# Patient Record
Sex: Male | Born: 1935
Health system: Southern US, Community
[De-identification: ages and names within clinical notes are randomized; demographics above are authoritative.]

## PROBLEM LIST (undated history)

## (undated) DIAGNOSIS — C61 Malignant neoplasm of prostate: Secondary | ICD-10-CM

## (undated) DIAGNOSIS — I350 Nonrheumatic aortic (valve) stenosis: Secondary | ICD-10-CM

## (undated) DIAGNOSIS — I2510 Atherosclerotic heart disease of native coronary artery without angina pectoris: Secondary | ICD-10-CM

## (undated) DIAGNOSIS — N312 Flaccid neuropathic bladder, not elsewhere classified: Secondary | ICD-10-CM

## (undated) DIAGNOSIS — E785 Hyperlipidemia, unspecified: Secondary | ICD-10-CM

## (undated) DIAGNOSIS — M1990 Unspecified osteoarthritis, unspecified site: Secondary | ICD-10-CM

## (undated) DIAGNOSIS — N139 Obstructive and reflux uropathy, unspecified: Secondary | ICD-10-CM

## (undated) DIAGNOSIS — G629 Polyneuropathy, unspecified: Secondary | ICD-10-CM

## (undated) DIAGNOSIS — R609 Edema, unspecified: Secondary | ICD-10-CM

## (undated) DIAGNOSIS — G250 Essential tremor: Secondary | ICD-10-CM

## (undated) DIAGNOSIS — I10 Essential (primary) hypertension: Secondary | ICD-10-CM

## (undated) DIAGNOSIS — K529 Noninfective gastroenteritis and colitis, unspecified: Secondary | ICD-10-CM

## (undated) DIAGNOSIS — I509 Heart failure, unspecified: Secondary | ICD-10-CM

## (undated) HISTORY — DX: Hyperlipidemia, unspecified: E78.5

## (undated) HISTORY — PX: COLONOSCOPY: SHX174

## (undated) HISTORY — DX: Flaccid neuropathic bladder, not elsewhere classified: N31.2

## (undated) HISTORY — DX: Polyneuropathy, unspecified: G62.9

## (undated) HISTORY — DX: Essential tremor: G25.0

## (undated) HISTORY — DX: Edema, unspecified: R60.9

## (undated) HISTORY — DX: Obstructive and reflux uropathy, unspecified: N13.9

## (undated) HISTORY — DX: Malignant neoplasm of prostate: C61

## (undated) HISTORY — PX: CARDIAC CATHETERIZATION: SHX172

## (undated) HISTORY — DX: Essential (primary) hypertension: I10

---

## 1972-06-28 HISTORY — PX: SKIN CANCER EXCISION: SHX779

## 1993-06-28 HISTORY — PX: ACHILLES TENDON SURGERY: SHX542

## 1998-12-23 ENCOUNTER — Ambulatory Visit (HOSPITAL_COMMUNITY): Admission: RE | Admit: 1998-12-23 | Discharge: 1998-12-23 | Payer: Self-pay | Admitting: Gastroenterology

## 1999-03-23 ENCOUNTER — Encounter: Admission: RE | Admit: 1999-03-23 | Discharge: 1999-04-10 | Payer: Self-pay | Admitting: Sports Medicine

## 2003-06-29 DIAGNOSIS — C61 Malignant neoplasm of prostate: Secondary | ICD-10-CM

## 2003-06-29 HISTORY — DX: Malignant neoplasm of prostate: C61

## 2003-10-04 ENCOUNTER — Ambulatory Visit: Admission: RE | Admit: 2003-10-04 | Discharge: 2003-12-02 | Payer: Self-pay | Admitting: Radiation Oncology

## 2007-06-23 ENCOUNTER — Ambulatory Visit (HOSPITAL_COMMUNITY): Admission: RE | Admit: 2007-06-23 | Discharge: 2007-06-23 | Payer: Self-pay | Admitting: Urology

## 2007-11-05 ENCOUNTER — Emergency Department (HOSPITAL_COMMUNITY): Admission: EM | Admit: 2007-11-05 | Discharge: 2007-11-05 | Payer: Self-pay | Admitting: Emergency Medicine

## 2008-06-14 ENCOUNTER — Ambulatory Visit: Payer: Self-pay

## 2008-08-14 ENCOUNTER — Encounter: Admission: RE | Admit: 2008-08-14 | Discharge: 2008-08-14 | Payer: Self-pay | Admitting: Neurology

## 2008-08-19 ENCOUNTER — Encounter: Admission: RE | Admit: 2008-08-19 | Discharge: 2008-08-19 | Payer: Self-pay | Admitting: Neurology

## 2008-09-13 ENCOUNTER — Encounter: Admission: RE | Admit: 2008-09-13 | Discharge: 2008-09-13 | Payer: Self-pay | Admitting: Neurology

## 2008-11-18 ENCOUNTER — Encounter: Admission: RE | Admit: 2008-11-18 | Discharge: 2008-11-18 | Payer: Self-pay | Admitting: Neurology

## 2008-11-24 ENCOUNTER — Emergency Department (HOSPITAL_COMMUNITY): Admission: EM | Admit: 2008-11-24 | Discharge: 2008-11-24 | Payer: Self-pay | Admitting: Emergency Medicine

## 2009-06-28 HISTORY — PX: TOTAL KNEE ARTHROPLASTY: SHX125

## 2009-11-10 ENCOUNTER — Inpatient Hospital Stay (HOSPITAL_COMMUNITY): Admission: RE | Admit: 2009-11-10 | Discharge: 2009-11-13 | Payer: Self-pay | Admitting: Orthopedic Surgery

## 2010-04-09 ENCOUNTER — Ambulatory Visit (HOSPITAL_COMMUNITY)
Admission: RE | Admit: 2010-04-09 | Discharge: 2010-04-09 | Payer: Self-pay | Source: Home / Self Care | Admitting: Gastroenterology

## 2010-05-06 ENCOUNTER — Encounter: Admission: RE | Admit: 2010-05-06 | Discharge: 2010-05-06 | Payer: Self-pay | Admitting: Family Medicine

## 2010-09-14 LAB — CBC
HCT: 29.7 % — ABNORMAL LOW (ref 39.0–52.0)
HCT: 29.8 % — ABNORMAL LOW (ref 39.0–52.0)
HCT: 32.7 % — ABNORMAL LOW (ref 39.0–52.0)
Hemoglobin: 10 g/dL — ABNORMAL LOW (ref 13.0–17.0)
Hemoglobin: 11.1 g/dL — ABNORMAL LOW (ref 13.0–17.0)
Hemoglobin: 9.9 g/dL — ABNORMAL LOW (ref 13.0–17.0)
MCHC: 33.4 g/dL (ref 30.0–36.0)
MCHC: 33.7 g/dL (ref 30.0–36.0)
MCHC: 34 g/dL (ref 30.0–36.0)
MCV: 97.7 fL (ref 78.0–100.0)
MCV: 98.2 fL (ref 78.0–100.0)
MCV: 98.3 fL (ref 78.0–100.0)
Platelets: 120 10*3/uL — ABNORMAL LOW (ref 150–400)
Platelets: 124 10*3/uL — ABNORMAL LOW (ref 150–400)
Platelets: 132 10*3/uL — ABNORMAL LOW (ref 150–400)
RBC: 3.03 MIL/uL — ABNORMAL LOW (ref 4.22–5.81)
RBC: 3.03 MIL/uL — ABNORMAL LOW (ref 4.22–5.81)
RBC: 3.34 MIL/uL — ABNORMAL LOW (ref 4.22–5.81)
RDW: 13.2 % (ref 11.5–15.5)
RDW: 13.3 % (ref 11.5–15.5)
RDW: 13.5 % (ref 11.5–15.5)
WBC: 8.4 10*3/uL (ref 4.0–10.5)
WBC: 9.4 10*3/uL (ref 4.0–10.5)
WBC: 9.8 10*3/uL (ref 4.0–10.5)

## 2010-09-14 LAB — BASIC METABOLIC PANEL
BUN: 15 mg/dL (ref 6–23)
BUN: 18 mg/dL (ref 6–23)
CO2: 27 mEq/L (ref 19–32)
CO2: 27 mEq/L (ref 19–32)
Calcium: 8.1 mg/dL — ABNORMAL LOW (ref 8.4–10.5)
Calcium: 8.4 mg/dL (ref 8.4–10.5)
Chloride: 106 mEq/L (ref 96–112)
Chloride: 107 mEq/L (ref 96–112)
Creatinine, Ser: 1.17 mg/dL (ref 0.4–1.5)
Creatinine, Ser: 1.2 mg/dL (ref 0.4–1.5)
GFR calc Af Amer: >60 mL/min (ref >60)
GFR calc Af Amer: >60 mL/min (ref >60)
GFR calc non Af Amer: 58 mL/min — ABNORMAL LOW (ref >60)
GFR calc non Af Amer: >60 mL/min (ref >60)
Glucose, Bld: 129 mg/dL — ABNORMAL HIGH (ref 70–99)
Glucose, Bld: 145 mg/dL — ABNORMAL HIGH (ref 70–99)
Potassium: 4.2 mEq/L (ref 3.5–5.1)
Potassium: 4.5 mEq/L (ref 3.5–5.1)
Sodium: 138 mEq/L (ref 135–145)
Sodium: 139 mEq/L (ref 135–145)

## 2010-09-14 LAB — PROTIME-INR
INR: 1.16 (ref 0.00–1.49)
INR: 1.49 (ref 0.00–1.49)
INR: 2.15 — ABNORMAL HIGH (ref 0.00–1.49)
Prothrombin Time: 14.7 seconds (ref 11.6–15.2)
Prothrombin Time: 17.9 seconds — ABNORMAL HIGH (ref 11.6–15.2)
Prothrombin Time: 23.8 seconds — ABNORMAL HIGH (ref 11.6–15.2)

## 2010-09-15 LAB — COMPREHENSIVE METABOLIC PANEL
ALT: 20 U/L (ref 0–53)
AST: 24 U/L (ref 0–37)
Albumin: 3.8 g/dL (ref 3.5–5.2)
Alkaline Phosphatase: 37 U/L — ABNORMAL LOW (ref 39–117)
BUN: 25 mg/dL — ABNORMAL HIGH (ref 6–23)
CO2: 24 mEq/L (ref 19–32)
Calcium: 9.1 mg/dL (ref 8.4–10.5)
Chloride: 107 mEq/L (ref 96–112)
Creatinine, Ser: 1.4 mg/dL (ref 0.4–1.5)
GFR calc Af Amer: 59 mL/min — ABNORMAL LOW (ref >60)
GFR calc non Af Amer: 49 mL/min — ABNORMAL LOW (ref >60)
Glucose, Bld: 107 mg/dL — ABNORMAL HIGH (ref 70–99)
Potassium: 4.1 mEq/L (ref 3.5–5.1)
Sodium: 141 mEq/L (ref 135–145)
Total Bilirubin: 0.9 mg/dL (ref 0.3–1.2)
Total Protein: 6.6 g/dL (ref 6.0–8.3)

## 2010-09-15 LAB — URINALYSIS, ROUTINE W REFLEX MICROSCOPIC
Bilirubin Urine: NEGATIVE
Glucose, UA: NEGATIVE mg/dL
Hgb urine dipstick: NEGATIVE
Ketones, ur: NEGATIVE mg/dL
Nitrite: NEGATIVE
Protein, ur: NEGATIVE mg/dL
Specific Gravity, Urine: 1.014 (ref 1.005–1.030)
Urobilinogen, UA: 0.2 mg/dL (ref 0.0–1.0)
pH: 6.5 (ref 5.0–8.0)

## 2010-09-15 LAB — CBC
HCT: 39 % (ref 39.0–52.0)
Hemoglobin: 13 g/dL (ref 13.0–17.0)
MCHC: 33.3 g/dL (ref 30.0–36.0)
MCV: 98 fL (ref 78.0–100.0)
Platelets: 147 10*3/uL — ABNORMAL LOW (ref 150–400)
RBC: 3.98 MIL/uL — ABNORMAL LOW (ref 4.22–5.81)
RDW: 13.8 % (ref 11.5–15.5)
WBC: 5.8 10*3/uL (ref 4.0–10.5)

## 2010-09-15 LAB — TYPE AND SCREEN
ABO/RH(D): A POS
Antibody Screen: NEGATIVE

## 2010-09-15 LAB — ABO/RH: ABO/RH(D): A POS

## 2010-09-15 LAB — APTT: aPTT: 32 seconds (ref 24–37)

## 2010-09-15 LAB — PROTIME-INR
INR: 1.11 (ref 0.00–1.49)
Prothrombin Time: 14.2 seconds (ref 11.6–15.2)

## 2010-10-06 LAB — DIFFERENTIAL
Basophils Absolute: 0 10*3/uL (ref 0.0–0.1)
Basophils Relative: 0 % (ref 0–1)
Eosinophils Absolute: 0.1 10*3/uL (ref 0.0–0.7)
Eosinophils Relative: 1 % (ref 0–5)
Lymphocytes Relative: 14 % (ref 12–46)
Lymphs Abs: 0.8 10*3/uL (ref 0.7–4.0)
Monocytes Absolute: 0.8 10*3/uL (ref 0.1–1.0)
Monocytes Relative: 13 % — ABNORMAL HIGH (ref 3–12)
Neutro Abs: 4.5 10*3/uL (ref 1.7–7.7)
Neutrophils Relative %: 73 % (ref 43–77)

## 2010-10-06 LAB — BASIC METABOLIC PANEL
BUN: 23 mg/dL (ref 6–23)
CO2: 24 mEq/L (ref 19–32)
Calcium: 9 mg/dL (ref 8.4–10.5)
Chloride: 107 mEq/L (ref 96–112)
Creatinine, Ser: 1.51 mg/dL — ABNORMAL HIGH (ref 0.4–1.5)
GFR calc Af Amer: 54 mL/min — ABNORMAL LOW (ref >60)
GFR calc non Af Amer: 45 mL/min — ABNORMAL LOW (ref >60)
Glucose, Bld: 101 mg/dL — ABNORMAL HIGH (ref 70–99)
Potassium: 4.6 mEq/L (ref 3.5–5.1)
Sodium: 137 mEq/L (ref 135–145)

## 2010-10-06 LAB — URINALYSIS, ROUTINE W REFLEX MICROSCOPIC
Bilirubin Urine: NEGATIVE
Glucose, UA: NEGATIVE mg/dL
Ketones, ur: NEGATIVE mg/dL
Nitrite: NEGATIVE
Protein, ur: >300 mg/dL — AB
Specific Gravity, Urine: 1.026 (ref 1.005–1.030)
Urobilinogen, UA: 0.2 mg/dL (ref 0.0–1.0)
pH: 6.5 (ref 5.0–8.0)

## 2010-10-06 LAB — URINE CULTURE
Colony Count: NO GROWTH
Culture: NO GROWTH

## 2010-10-06 LAB — PROTIME-INR
INR: 1 (ref 0.00–1.49)
Prothrombin Time: 13.4 seconds (ref 11.6–15.2)

## 2010-10-06 LAB — URINE MICROSCOPIC-ADD ON

## 2010-10-06 LAB — CBC
HCT: 37.2 % — ABNORMAL LOW (ref 39.0–52.0)
Hemoglobin: 12.6 g/dL — ABNORMAL LOW (ref 13.0–17.0)
MCHC: 33.9 g/dL (ref 30.0–36.0)
MCV: 97.3 fL (ref 78.0–100.0)
Platelets: 168 10*3/uL (ref 150–400)
RBC: 3.82 MIL/uL — ABNORMAL LOW (ref 4.22–5.81)
RDW: 13.5 % (ref 11.5–15.5)
WBC: 6.2 10*3/uL (ref 4.0–10.5)

## 2010-10-06 LAB — APTT: aPTT: 34 seconds (ref 24–37)

## 2010-11-10 NOTE — Consult Note (Signed)
[name] [name], BRINCKS NO.:  [id number]   MEDICAL RECORD NO.:  [id number]          PATIENT TYPE:  EMS   LOCATION:  ED                           FACILITY:  [facility]   PHYSICIAN:  Hanley Ben, M.D.  DATE OF BIRTH:  [birth year]   DATE OF CONSULTATION:  11/24/2008  DATE OF DISCHARGE:  11/24/2008                                 CONSULTATION   REASON FOR CONSULTATION:  Gross hematuria.   HISTORY OF PRESENT ILLNESS:  The patient is 77 year old male with a  history of prostate cancer who has been on active surveillance.  He was  doing well until last night when he started having gross painless  hematuria.  For the past 2 days before these  episodes of hematuria, he  started having slight dysuria.  He denies frequency, urgency, hesitancy  or straining on urination.  His PSA has been fluctuating and it was 23.1  in May 2009 and was 12.4 in August 2009.  The hematuria is not  associated with any flank pain or any pain in the suprapubic area.  He  voids with a good flow.  His hemoglobin is 12.6, hematocrit 37.2 and WBC  6.2.  BUN is 23, creatinine 1.51.  His INR is 1.0.  Urinalysis shows too-  numerous-to-count rbc's and too-numerous-to-count wbc's.   PAST MEDICAL HISTORY:  1. Sleep apnea.  2. Hypercholesterolemia.  3. Hypertension.  4. Prostate cancer.   PAST SURGICAL HISTORY:  Repair of ruptured Achilles tendon.   MEDICATIONS:  1. Aspirin 81 mg.  2. Benecol 40 mg.  3. Vytorin 10/40 mg one daily.   FAMILY HISTORY:  Father and mother are deceased of unknown causes to  him.   ALLERGIES:  NO KNOWN DRUG ALLERGIES.   REVIEW OF SYSTEMS:  Review of systems is as noted in the HPI and  everything else is negative.   PHYSICAL EXAMINATION:  GENERAL:  This is a well-developed, 77 year old  male who is very anxious.  He is alert and oriented to time, place and  person.  VITAL SIGNS:  Stable.  ABDOMEN:  His abdomen is soft and nondistended, nontender.  He has no  CVA  tenderness.  No hepatomegaly, no splenomegaly.  Kidneys are not  palpable.  Bladder is not distended.  He has no inguinal hernia.  No  inguinal adenopathy.  Penis and scrotal contents are within normal  limits.  RECTAL:  Examination is deferred.   IMPRESSION:  1. Gross hematuria.  2. Prostate cancer.  3. Hypertension.  4. Urinary tract infection.   PLAN:  Urine culture and sensitivity.  Start Levaquin 500 mg daily,  Avodart  0.5 mg daily.  Increase fluid intake.  He will have a CT scan  and cystoscopy as an outpatient.      Hanley Ben, M.D.  Electronically Signed     MN/MEDQ  D:  11/24/2008  T:  11/25/2008  Job:  [id number]

## 2011-08-04 DIAGNOSIS — H905 Unspecified sensorineural hearing loss: Secondary | ICD-10-CM | POA: Diagnosis not present

## 2011-09-22 DIAGNOSIS — D485 Neoplasm of uncertain behavior of skin: Secondary | ICD-10-CM | POA: Diagnosis not present

## 2011-10-12 DIAGNOSIS — C61 Malignant neoplasm of prostate: Secondary | ICD-10-CM | POA: Diagnosis not present

## 2011-10-18 DIAGNOSIS — C61 Malignant neoplasm of prostate: Secondary | ICD-10-CM | POA: Diagnosis not present

## 2011-10-18 DIAGNOSIS — R339 Retention of urine, unspecified: Secondary | ICD-10-CM | POA: Diagnosis not present

## 2011-10-18 DIAGNOSIS — N138 Other obstructive and reflux uropathy: Secondary | ICD-10-CM | POA: Diagnosis not present

## 2011-10-18 DIAGNOSIS — N401 Enlarged prostate with lower urinary tract symptoms: Secondary | ICD-10-CM | POA: Diagnosis not present

## 2011-10-20 ENCOUNTER — Other Ambulatory Visit (HOSPITAL_COMMUNITY): Payer: Self-pay | Admitting: Urology

## 2011-10-20 DIAGNOSIS — Z8546 Personal history of malignant neoplasm of prostate: Secondary | ICD-10-CM

## 2011-11-10 ENCOUNTER — Encounter (HOSPITAL_COMMUNITY)
Admission: RE | Admit: 2011-11-10 | Discharge: 2011-11-10 | Disposition: A | Discharge status: Home / Self Care | Payer: Medicare Other | Source: Ambulatory Visit | Attending: Urology | Admitting: Urology

## 2011-11-10 DIAGNOSIS — Z8546 Personal history of malignant neoplasm of prostate: Secondary | ICD-10-CM

## 2011-11-10 DIAGNOSIS — C61 Malignant neoplasm of prostate: Secondary | ICD-10-CM | POA: Insufficient documentation

## 2011-11-10 MED ORDER — TECHNETIUM TC 99M MEDRONATE IV KIT
25.0000 | PACK | Freq: Once | INTRAVENOUS | Status: AC | PRN
  Administered 2011-11-10: 25 via INTRAVENOUS

## 2011-11-17 DIAGNOSIS — H02839 Dermatochalasis of unspecified eye, unspecified eyelid: Secondary | ICD-10-CM | POA: Diagnosis not present

## 2011-11-17 DIAGNOSIS — H43819 Vitreous degeneration, unspecified eye: Secondary | ICD-10-CM | POA: Diagnosis not present

## 2011-11-17 DIAGNOSIS — H35369 Drusen (degenerative) of macula, unspecified eye: Secondary | ICD-10-CM | POA: Diagnosis not present

## 2011-11-17 DIAGNOSIS — H2510 Age-related nuclear cataract, unspecified eye: Secondary | ICD-10-CM | POA: Diagnosis not present

## 2011-11-24 DIAGNOSIS — H2510 Age-related nuclear cataract, unspecified eye: Secondary | ICD-10-CM | POA: Diagnosis not present

## 2011-12-01 DIAGNOSIS — IMO0002 Reserved for concepts with insufficient information to code with codable children: Secondary | ICD-10-CM | POA: Diagnosis not present

## 2011-12-01 DIAGNOSIS — H2510 Age-related nuclear cataract, unspecified eye: Secondary | ICD-10-CM | POA: Diagnosis not present

## 2011-12-15 DIAGNOSIS — H35369 Drusen (degenerative) of macula, unspecified eye: Secondary | ICD-10-CM | POA: Diagnosis not present

## 2011-12-15 DIAGNOSIS — H2510 Age-related nuclear cataract, unspecified eye: Secondary | ICD-10-CM | POA: Diagnosis not present

## 2011-12-15 DIAGNOSIS — H43819 Vitreous degeneration, unspecified eye: Secondary | ICD-10-CM | POA: Diagnosis not present

## 2011-12-17 DIAGNOSIS — H2510 Age-related nuclear cataract, unspecified eye: Secondary | ICD-10-CM | POA: Diagnosis not present

## 2011-12-17 DIAGNOSIS — IMO0002 Reserved for concepts with insufficient information to code with codable children: Secondary | ICD-10-CM | POA: Diagnosis not present

## 2012-01-26 DIAGNOSIS — H04129 Dry eye syndrome of unspecified lacrimal gland: Secondary | ICD-10-CM | POA: Diagnosis not present

## 2012-01-26 DIAGNOSIS — H26499 Other secondary cataract, unspecified eye: Secondary | ICD-10-CM | POA: Diagnosis not present

## 2012-03-23 DIAGNOSIS — Z23 Encounter for immunization: Secondary | ICD-10-CM | POA: Diagnosis not present

## 2012-04-18 DIAGNOSIS — C61 Malignant neoplasm of prostate: Secondary | ICD-10-CM | POA: Diagnosis not present

## 2012-04-24 DIAGNOSIS — N138 Other obstructive and reflux uropathy: Secondary | ICD-10-CM | POA: Diagnosis not present

## 2012-04-24 DIAGNOSIS — R339 Retention of urine, unspecified: Secondary | ICD-10-CM | POA: Diagnosis not present

## 2012-04-24 DIAGNOSIS — N319 Neuromuscular dysfunction of bladder, unspecified: Secondary | ICD-10-CM | POA: Diagnosis not present

## 2012-04-24 DIAGNOSIS — C61 Malignant neoplasm of prostate: Secondary | ICD-10-CM | POA: Diagnosis not present

## 2012-04-24 DIAGNOSIS — N401 Enlarged prostate with lower urinary tract symptoms: Secondary | ICD-10-CM | POA: Diagnosis not present

## 2012-05-03 DIAGNOSIS — N281 Cyst of kidney, acquired: Secondary | ICD-10-CM | POA: Diagnosis not present

## 2012-05-17 DIAGNOSIS — M2040 Other hammer toe(s) (acquired), unspecified foot: Secondary | ICD-10-CM | POA: Diagnosis not present

## 2012-05-17 DIAGNOSIS — M779 Enthesopathy, unspecified: Secondary | ICD-10-CM | POA: Diagnosis not present

## 2012-05-17 DIAGNOSIS — M7150 Other bursitis, not elsewhere classified, unspecified site: Secondary | ICD-10-CM | POA: Diagnosis not present

## 2012-05-17 DIAGNOSIS — D2370 Other benign neoplasm of skin of unspecified lower limb, including hip: Secondary | ICD-10-CM | POA: Diagnosis not present

## 2012-05-17 DIAGNOSIS — M79609 Pain in unspecified limb: Secondary | ICD-10-CM | POA: Diagnosis not present

## 2012-06-07 DIAGNOSIS — E7800 Pure hypercholesterolemia, unspecified: Secondary | ICD-10-CM | POA: Diagnosis not present

## 2012-06-07 DIAGNOSIS — Z0000 Encounter for general adult medical examination without abnormal findings: Secondary | ICD-10-CM | POA: Diagnosis not present

## 2012-06-07 DIAGNOSIS — Z1331 Encounter for screening for depression: Secondary | ICD-10-CM | POA: Diagnosis not present

## 2012-06-07 DIAGNOSIS — M48061 Spinal stenosis, lumbar region without neurogenic claudication: Secondary | ICD-10-CM | POA: Diagnosis not present

## 2012-06-07 DIAGNOSIS — G609 Hereditary and idiopathic neuropathy, unspecified: Secondary | ICD-10-CM | POA: Diagnosis not present

## 2012-06-07 DIAGNOSIS — K5190 Ulcerative colitis, unspecified, without complications: Secondary | ICD-10-CM | POA: Diagnosis not present

## 2012-06-07 DIAGNOSIS — I10 Essential (primary) hypertension: Secondary | ICD-10-CM | POA: Diagnosis not present

## 2012-06-07 DIAGNOSIS — C61 Malignant neoplasm of prostate: Secondary | ICD-10-CM | POA: Diagnosis not present

## 2012-06-07 DIAGNOSIS — Z23 Encounter for immunization: Secondary | ICD-10-CM | POA: Diagnosis not present

## 2012-06-08 DIAGNOSIS — Z23 Encounter for immunization: Secondary | ICD-10-CM | POA: Diagnosis not present

## 2012-06-08 DIAGNOSIS — I10 Essential (primary) hypertension: Secondary | ICD-10-CM | POA: Diagnosis not present

## 2012-06-14 DIAGNOSIS — L570 Actinic keratosis: Secondary | ICD-10-CM | POA: Diagnosis not present

## 2012-06-14 DIAGNOSIS — B079 Viral wart, unspecified: Secondary | ICD-10-CM | POA: Diagnosis not present

## 2012-06-14 DIAGNOSIS — D485 Neoplasm of uncertain behavior of skin: Secondary | ICD-10-CM | POA: Diagnosis not present

## 2012-08-16 DIAGNOSIS — J40 Bronchitis, not specified as acute or chronic: Secondary | ICD-10-CM | POA: Diagnosis not present

## 2012-08-21 DIAGNOSIS — R059 Cough, unspecified: Secondary | ICD-10-CM | POA: Diagnosis not present

## 2012-08-23 DIAGNOSIS — B079 Viral wart, unspecified: Secondary | ICD-10-CM | POA: Diagnosis not present

## 2012-08-23 DIAGNOSIS — L570 Actinic keratosis: Secondary | ICD-10-CM | POA: Diagnosis not present

## 2012-09-13 DIAGNOSIS — I959 Hypotension, unspecified: Secondary | ICD-10-CM | POA: Diagnosis not present

## 2012-09-13 DIAGNOSIS — J189 Pneumonia, unspecified organism: Secondary | ICD-10-CM | POA: Diagnosis not present

## 2012-09-22 DIAGNOSIS — S7000XA Contusion of unspecified hip, initial encounter: Secondary | ICD-10-CM | POA: Diagnosis not present

## 2012-09-22 DIAGNOSIS — Z96659 Presence of unspecified artificial knee joint: Secondary | ICD-10-CM | POA: Diagnosis not present

## 2012-09-27 ENCOUNTER — Encounter: Payer: Self-pay | Admitting: Cardiology

## 2012-09-27 ENCOUNTER — Ambulatory Visit (INDEPENDENT_AMBULATORY_CARE_PROVIDER_SITE_OTHER): Payer: Medicare Other | Admitting: Cardiology

## 2012-09-27 VITALS — BP 124/74 | HR 78 | Ht 69.5 in | Wt 187.0 lb

## 2012-09-27 DIAGNOSIS — R609 Edema, unspecified: Secondary | ICD-10-CM

## 2012-09-27 DIAGNOSIS — R0600 Dyspnea, unspecified: Secondary | ICD-10-CM

## 2012-09-27 DIAGNOSIS — R600 Localized edema: Secondary | ICD-10-CM

## 2012-09-27 DIAGNOSIS — R0989 Other specified symptoms and signs involving the circulatory and respiratory systems: Secondary | ICD-10-CM

## 2012-09-27 DIAGNOSIS — I10 Essential (primary) hypertension: Secondary | ICD-10-CM | POA: Diagnosis not present

## 2012-09-27 DIAGNOSIS — E785 Hyperlipidemia, unspecified: Secondary | ICD-10-CM | POA: Insufficient documentation

## 2012-09-27 DIAGNOSIS — R0609 Other forms of dyspnea: Secondary | ICD-10-CM | POA: Diagnosis not present

## 2012-09-27 LAB — BRAIN NATRIURETIC PEPTIDE: Pro B Natriuretic peptide (BNP): 213 pg/mL — ABNORMAL HIGH (ref 0.0–100.0)

## 2012-09-27 LAB — TSH: TSH: 0.29 u[IU]/mL — ABNORMAL LOW (ref 0.35–5.50)

## 2012-09-27 LAB — D-DIMER, QUANTITATIVE (NOT AT ARMC): D-Dimer, Quant: 0.8 ug/mL-FEU — ABNORMAL HIGH (ref 0.00–0.48)

## 2012-09-27 NOTE — Patient Instructions (Addendum)
We will schedule you for an Echocardiogram and venous doppler study.  We will check lab work today.  Restrict your salt intake.  Elevate your feet when possible.

## 2012-09-27 NOTE — Progress Notes (Signed)
[name] [name] Date of Birth: 17-Dec-1927 Medical Record [id number]  History of Present Illness: [name] [name] is seen at the request of Dr. Rex Kras for evaluation of dyspnea. He is a pleasant 79 year old white male who reports that over the past 2 months he has had symptoms of dyspnea particularly on exertion. He also complains of fatigue and dizziness. He had taken an extensive trip to [place] earlier in the year. He developed an upper respiratory infection while there and was treated with antibiotics on his return. His blood pressure was running low and his Benicar was recently stopped. Since then his symptoms have been a little bit better. He has had increased swelling in his lower extremities, particularly in the left leg over the past 2 months. He denies any chest pain. He currently has no cough or fevers. He does have a history of hypertension, hyperlipidemia, and family history of coronary disease.  No current outpatient prescriptions on file prior to visit.   No current facility-administered medications on file prior to visit.    Not on File  Past Medical History  Diagnosis Date  . Peripheral neuropathy     Elevated by Dr. love 2010  . Essential hypertension   . Hyperlipidemia   . Essential tremor   . Obstructive uropathy   . Hypotonic bladder     L4-L5  . Prostate cancer 2005    Past Surgical History  Procedure Laterality Date  . Skin cancer excision  1974    On chest wall  . Achilles tendon surgery  1995  . Colonoscopy      With polyp resection  . Total knee arthroplasty  2011    History  Smoking status  . Never Smoker   Smokeless tobacco  . Not on file    History  Alcohol Use: Not on file    Family History  Problem Relation Age of Onset  . Hypertension Mother   . Heart failure Mother   . Diabetes Mother     Review of Systems: As noted in history of present illness.  All other systems were reviewed and are negative.  Physical Exam: BP 124/74  Pulse  78  Ht 5' 9.5" (1.765 m)  Wt 187 lb (84.823 kg)  BMI 27.23 kg/m2 He is a pleasant white male who appears younger than his stated age. HEENT: Normocephalic, atraumatic. Pupils equal round and reactive. Extraocular movements full. Sclera are clear. Oropharynx is clear. He has no JVD, adenopathy, thyromegaly, or bruits. Lungs: Clear Cardiovascular: Regular rate and rhythm, normal S1 and S2, grade 1/6 systolic murmur at the left upper sternal border. No diastolic murmur or S3. Abdomen: Overweight, soft, nontender. No masses or hepatosplenomegaly. Extremities: Femoral and pedal pulses are 2+. He has 2+ edema on the left and 1-2+ edema on the right. Skin: Warm and dry Neuro: Alert and oriented x3. Cranial nerves II through XII are intact.  LABORATORY DATA: ECG demonstrates normal sinus rhythm with a normal ECG.  Chest x-ray dated 08/21/2012 showed bibasilar atelectasis versus infiltrates. Repeat 09/13/2012 showed no active disease.  Laboratory data 09/13/2012 showed a BUN of 28, creatinine 1.29. Glucose 133. Other chemistries and CBC were normal.  Assessment / Plan: 1. Symptoms of dyspnea. Patient does have significant lower extremity edema. It is worse on the left. Given his recent travel degree need to make sure he does not have a DVT or possible PE. We will also assess his thyroid function. Today we will check a d-dimer, BNP level, and TSH. I'll schedule  him for venous Dopplers. We will also check an echocardiogram. I'll followup again after the studies. I recommended sodium restriction. Recommend elevation of his feet when possible. Given his recent hypotension I would not start him on diuretic until we have a more complete evaluation. If the above studies are unremarkable may need to consider ischemic evaluation.  2. Fatigue and lightheadedness. Improved with cessation of antihypertensive therapy.  3. Hypercholesterolemia. Patient is intolerant to statins. He is currently on Zetia.

## 2012-09-29 ENCOUNTER — Other Ambulatory Visit: Payer: Self-pay

## 2012-09-29 DIAGNOSIS — R7989 Other specified abnormal findings of blood chemistry: Secondary | ICD-10-CM

## 2012-10-02 ENCOUNTER — Ambulatory Visit (INDEPENDENT_AMBULATORY_CARE_PROVIDER_SITE_OTHER): Payer: Medicare Other | Admitting: *Deleted

## 2012-10-02 ENCOUNTER — Ambulatory Visit (HOSPITAL_COMMUNITY): Payer: Medicare Other | Attending: Cardiology | Admitting: Radiology

## 2012-10-02 DIAGNOSIS — R600 Localized edema: Secondary | ICD-10-CM

## 2012-10-02 DIAGNOSIS — R0602 Shortness of breath: Secondary | ICD-10-CM | POA: Insufficient documentation

## 2012-10-02 DIAGNOSIS — E785 Hyperlipidemia, unspecified: Secondary | ICD-10-CM

## 2012-10-02 DIAGNOSIS — R946 Abnormal results of thyroid function studies: Secondary | ICD-10-CM

## 2012-10-02 DIAGNOSIS — R609 Edema, unspecified: Secondary | ICD-10-CM | POA: Diagnosis not present

## 2012-10-02 DIAGNOSIS — I10 Essential (primary) hypertension: Secondary | ICD-10-CM | POA: Diagnosis not present

## 2012-10-02 DIAGNOSIS — R0609 Other forms of dyspnea: Secondary | ICD-10-CM

## 2012-10-02 DIAGNOSIS — R0989 Other specified symptoms and signs involving the circulatory and respiratory systems: Secondary | ICD-10-CM

## 2012-10-02 DIAGNOSIS — R0600 Dyspnea, unspecified: Secondary | ICD-10-CM

## 2012-10-02 DIAGNOSIS — R7989 Other specified abnormal findings of blood chemistry: Secondary | ICD-10-CM

## 2012-10-02 DIAGNOSIS — R011 Cardiac murmur, unspecified: Secondary | ICD-10-CM | POA: Insufficient documentation

## 2012-10-02 NOTE — Progress Notes (Signed)
Echocardiogram performed.  

## 2012-10-03 LAB — TSH: TSH: 0.94 u[IU]/mL (ref 0.35–5.50)

## 2012-10-03 LAB — T3, FREE: T3, Free: 2.3 pg/mL (ref 2.3–4.2)

## 2012-10-03 LAB — T4, FREE: Free T4: 1.07 ng/dL (ref 0.60–1.60)

## 2012-10-04 ENCOUNTER — Encounter (INDEPENDENT_AMBULATORY_CARE_PROVIDER_SITE_OTHER): Payer: Medicare Other

## 2012-10-04 ENCOUNTER — Other Ambulatory Visit (HOSPITAL_COMMUNITY): Payer: 59

## 2012-10-04 ENCOUNTER — Encounter: Payer: Self-pay | Admitting: Cardiology

## 2012-10-04 DIAGNOSIS — R609 Edema, unspecified: Secondary | ICD-10-CM

## 2012-10-04 DIAGNOSIS — I10 Essential (primary) hypertension: Secondary | ICD-10-CM

## 2012-10-04 DIAGNOSIS — R600 Localized edema: Secondary | ICD-10-CM

## 2012-10-04 DIAGNOSIS — D2370 Other benign neoplasm of skin of unspecified lower limb, including hip: Secondary | ICD-10-CM | POA: Diagnosis not present

## 2012-10-04 DIAGNOSIS — E785 Hyperlipidemia, unspecified: Secondary | ICD-10-CM

## 2012-10-04 DIAGNOSIS — R0600 Dyspnea, unspecified: Secondary | ICD-10-CM

## 2012-10-12 ENCOUNTER — Telehealth: Payer: Self-pay

## 2012-10-12 NOTE — Telephone Encounter (Signed)
Patient called he stated he was still having swelling in lf ankle.Advised to keep appointment with Dr.Jordan 10/23/12.Patient stated he does not like to wear compression stockings.Advised  try wearing compression stockings.

## 2012-10-18 ENCOUNTER — Other Ambulatory Visit: Payer: Self-pay | Admitting: Podiatry

## 2012-10-18 DIAGNOSIS — D485 Neoplasm of uncertain behavior of skin: Secondary | ICD-10-CM | POA: Diagnosis not present

## 2012-10-18 DIAGNOSIS — C61 Malignant neoplasm of prostate: Secondary | ICD-10-CM | POA: Diagnosis not present

## 2012-10-18 DIAGNOSIS — L84 Corns and callosities: Secondary | ICD-10-CM | POA: Diagnosis not present

## 2012-10-23 ENCOUNTER — Encounter: Payer: Self-pay | Admitting: Cardiology

## 2012-10-23 ENCOUNTER — Ambulatory Visit (INDEPENDENT_AMBULATORY_CARE_PROVIDER_SITE_OTHER): Payer: Medicare Other | Admitting: Cardiology

## 2012-10-23 ENCOUNTER — Ambulatory Visit: Payer: 59 | Admitting: Cardiology

## 2012-10-23 VITALS — BP 124/84 | HR 90 | Ht 65.5 in | Wt 185.0 lb

## 2012-10-23 DIAGNOSIS — E785 Hyperlipidemia, unspecified: Secondary | ICD-10-CM | POA: Diagnosis not present

## 2012-10-23 DIAGNOSIS — R609 Edema, unspecified: Secondary | ICD-10-CM

## 2012-10-23 DIAGNOSIS — I10 Essential (primary) hypertension: Secondary | ICD-10-CM | POA: Diagnosis not present

## 2012-10-23 NOTE — Patient Instructions (Signed)
You need to restrict your salt intake.  Wear your support hose every day. Put them on first thing in the in the morning.  I will see you again in 3 months.

## 2012-10-24 NOTE — Progress Notes (Signed)
[name] [name] Date of Birth: [birth year] Medical Record [id number]  History of Present Illness: [name] [name] is seen for followup of his edema. He continues to complain of swelling in his legs and ankles worse on the left. His swelling completely resolves at night and then increases throughout the day. He really denies any significant shortness of breath or chest pain on followup today. His blood pressure has remained normal since he stopped taking Benicar.  Current Outpatient Prescriptions on File Prior to Visit  Medication Sig Dispense Refill  . aspirin 81 MG tablet Take 81 mg by mouth daily.      . B Complex Vitamins (VITAMIN B COMPLEX PO) Take by mouth.      . dutasteride (AVODART) 0.5 MG capsule Take 0.5 mg by mouth daily.      Marland Kitchen ezetimibe (ZETIA) 10 MG tablet Take 10 mg by mouth daily.      . folic acid (FOLVITE) 1 MG tablet Take 1 mg by mouth daily.      Marland Kitchen GARLIC PO Take by mouth.      Marland Kitchen GLUCOSAMINE-CHONDROITIN PO Take by mouth.      . Multiple Vitamin (MULTIVITAMIN) tablet Take 1 tablet by mouth daily.      . Saw Palmetto, Serenoa repens, (SAW PALMETTO PO) Take by mouth.      . sulfaSALAzine (AZULFIDINE) 500 MG tablet Take 500 mg by mouth daily.      . tamsulosin (FLOMAX) 0.4 MG CAPS Take 0.4 mg by mouth as directed.       No current facility-administered medications on file prior to visit.    No Known Allergies  Past Medical History  Diagnosis Date  . Peripheral neuropathy     Elevated by Dr. love 2010  . Essential hypertension   . Hyperlipidemia   . Essential tremor   . Obstructive uropathy   . Hypotonic bladder     L4-L5  . Prostate cancer 2005  . Edema     Past Surgical History  Procedure Laterality Date  . Skin cancer excision  1974    On chest wall  . Achilles tendon surgery  1995  . Colonoscopy      With polyp resection  . Total knee arthroplasty  2011    History  Smoking status  . Never Smoker   Smokeless tobacco  . Not on file    History    Alcohol Use: Not on file    Family History  Problem Relation Age of Onset  . Hypertension Mother   . Heart failure Mother   . Diabetes Mother     Review of Systems: As noted in history of present illness.  All other systems were reviewed and are negative.  Physical Exam: BP 124/84  Pulse 90  Ht 5' 5.5" (1.664 m)  Wt 185 lb (83.915 kg)  BMI 30.31 kg/m2  SpO2 96% He is a pleasant white male who appears younger than his stated age. HEENT: Normocephalic, atraumatic. Pupils equal round and reactive. Extraocular movements full. Sclera are clear. Oropharynx is clear. He has no JVD, adenopathy, thyromegaly, or bruits. Lungs: Clear Cardiovascular: Regular rate and rhythm, normal S1 and S2, grade 1/6 systolic murmur at the left upper sternal border. No diastolic murmur or S3. Abdomen: Overweight, soft, nontender. No masses or hepatosplenomegaly. Extremities: Femoral and pedal pulses are 2+. He has 2+ edema on the left and 1-2+ edema on the right. Skin: Warm and dry Neuro: Alert and oriented x3. Cranial nerves II through XII are  intact.  LABORATORY DATA: Initial TSH was low at 0.29. Repeat TSH was normal at 0.94. Free T4 was normal at 1.07 and T3 normal at 2.3. BNP was mildly elevated at 213. D-dimer was 0.8. Lower extremity venous Dopplers were normal without evidence of DVT. Echocardiogram demonstrated moderate focal basal hypertrophy of the septum. Ejection fraction was normal at 55-60%. The aortic valve was sclerotic without stenosis. There was moderate left atrial enlargement and mild to moderate mitral insufficiency. Pulmonary artery pressure was normal.  Assessment / Plan: 1. Edema. I think his symptoms are most consistent with venous insufficiency. His edema resolves at night with elevation. His echocardiogram was fairly unremarkable. No evidence of right heart failure. Although d-dimer was slightly elevated his venous Dopplers were normal. I recommended conservative measures with  elevation of his feet when possible, sodium restriction, and use of compression stockings. If this does not improve his swelling then we could consider addition of a diuretic. Currently he really has no complaints of dyspnea. If the symptoms should recur it may be worthwhile doing a stress test to rule out ischemic etiology. I'll followup again in 3 months.  2. Fatigue and lightheadedness. Improved with cessation of antihypertensive therapy.  3. Hypercholesterolemia. Patient is intolerant to statins. He is currently on Zetia.

## 2012-10-25 DIAGNOSIS — L570 Actinic keratosis: Secondary | ICD-10-CM | POA: Diagnosis not present

## 2012-10-25 DIAGNOSIS — B079 Viral wart, unspecified: Secondary | ICD-10-CM | POA: Diagnosis not present

## 2012-10-26 DIAGNOSIS — N319 Neuromuscular dysfunction of bladder, unspecified: Secondary | ICD-10-CM | POA: Diagnosis not present

## 2012-10-26 DIAGNOSIS — R339 Retention of urine, unspecified: Secondary | ICD-10-CM | POA: Diagnosis not present

## 2012-11-06 DIAGNOSIS — R339 Retention of urine, unspecified: Secondary | ICD-10-CM | POA: Diagnosis not present

## 2012-11-10 DIAGNOSIS — R339 Retention of urine, unspecified: Secondary | ICD-10-CM | POA: Diagnosis not present

## 2012-11-13 DIAGNOSIS — L570 Actinic keratosis: Secondary | ICD-10-CM | POA: Diagnosis not present

## 2012-11-13 DIAGNOSIS — T6391XA Toxic effect of contact with unspecified venomous animal, accidental (unintentional), initial encounter: Secondary | ICD-10-CM | POA: Diagnosis not present

## 2012-11-13 DIAGNOSIS — D485 Neoplasm of uncertain behavior of skin: Secondary | ICD-10-CM | POA: Diagnosis not present

## 2012-11-24 DIAGNOSIS — M1710 Unilateral primary osteoarthritis, unspecified knee: Secondary | ICD-10-CM | POA: Diagnosis not present

## 2012-11-30 DIAGNOSIS — N401 Enlarged prostate with lower urinary tract symptoms: Secondary | ICD-10-CM | POA: Diagnosis not present

## 2012-11-30 DIAGNOSIS — N319 Neuromuscular dysfunction of bladder, unspecified: Secondary | ICD-10-CM | POA: Diagnosis not present

## 2012-11-30 DIAGNOSIS — R82998 Other abnormal findings in urine: Secondary | ICD-10-CM | POA: Diagnosis not present

## 2012-11-30 DIAGNOSIS — N138 Other obstructive and reflux uropathy: Secondary | ICD-10-CM | POA: Diagnosis not present

## 2012-11-30 DIAGNOSIS — C61 Malignant neoplasm of prostate: Secondary | ICD-10-CM | POA: Diagnosis not present

## 2012-11-30 DIAGNOSIS — R339 Retention of urine, unspecified: Secondary | ICD-10-CM | POA: Diagnosis not present

## 2012-12-01 DIAGNOSIS — B079 Viral wart, unspecified: Secondary | ICD-10-CM | POA: Diagnosis not present

## 2012-12-01 DIAGNOSIS — T148XXA Other injury of unspecified body region, initial encounter: Secondary | ICD-10-CM | POA: Diagnosis not present

## 2012-12-01 DIAGNOSIS — L905 Scar conditions and fibrosis of skin: Secondary | ICD-10-CM | POA: Diagnosis not present

## 2012-12-20 DIAGNOSIS — R82998 Other abnormal findings in urine: Secondary | ICD-10-CM | POA: Diagnosis not present

## 2012-12-20 DIAGNOSIS — R079 Chest pain, unspecified: Secondary | ICD-10-CM | POA: Diagnosis not present

## 2012-12-20 DIAGNOSIS — M549 Dorsalgia, unspecified: Secondary | ICD-10-CM | POA: Diagnosis not present

## 2012-12-27 DIAGNOSIS — D2370 Other benign neoplasm of skin of unspecified lower limb, including hip: Secondary | ICD-10-CM | POA: Diagnosis not present

## 2013-01-03 DIAGNOSIS — D485 Neoplasm of uncertain behavior of skin: Secondary | ICD-10-CM | POA: Diagnosis not present

## 2013-01-03 DIAGNOSIS — B079 Viral wart, unspecified: Secondary | ICD-10-CM | POA: Diagnosis not present

## 2013-01-17 DIAGNOSIS — M216X9 Other acquired deformities of unspecified foot: Secondary | ICD-10-CM | POA: Diagnosis not present

## 2013-01-17 DIAGNOSIS — Q828 Other specified congenital malformations of skin: Secondary | ICD-10-CM | POA: Diagnosis not present

## 2013-01-18 DIAGNOSIS — M1710 Unilateral primary osteoarthritis, unspecified knee: Secondary | ICD-10-CM | POA: Diagnosis not present

## 2013-01-31 DIAGNOSIS — M216X9 Other acquired deformities of unspecified foot: Secondary | ICD-10-CM | POA: Diagnosis not present

## 2013-01-31 DIAGNOSIS — M2040 Other hammer toe(s) (acquired), unspecified foot: Secondary | ICD-10-CM | POA: Diagnosis not present

## 2013-01-31 DIAGNOSIS — Q828 Other specified congenital malformations of skin: Secondary | ICD-10-CM | POA: Diagnosis not present

## 2013-01-31 DIAGNOSIS — M19079 Primary osteoarthritis, unspecified ankle and foot: Secondary | ICD-10-CM | POA: Diagnosis not present

## 2013-02-07 ENCOUNTER — Encounter: Payer: Self-pay | Admitting: Cardiology

## 2013-02-07 ENCOUNTER — Ambulatory Visit (INDEPENDENT_AMBULATORY_CARE_PROVIDER_SITE_OTHER): Payer: Medicare Other | Admitting: Cardiology

## 2013-02-07 VITALS — BP 122/64 | HR 86 | Ht 69.5 in | Wt 184.8 lb

## 2013-02-07 DIAGNOSIS — I10 Essential (primary) hypertension: Secondary | ICD-10-CM | POA: Diagnosis not present

## 2013-02-07 DIAGNOSIS — R600 Localized edema: Secondary | ICD-10-CM

## 2013-02-07 DIAGNOSIS — R609 Edema, unspecified: Secondary | ICD-10-CM | POA: Diagnosis not present

## 2013-02-07 NOTE — Patient Instructions (Addendum)
Continue your current therapy  I will see you in one year   

## 2013-02-07 NOTE — Progress Notes (Signed)
[name] [name] Date of Birth: 01/22/1928 Medical Record [id number]  History of Present Illness: [name] [name] is seen for followup of his edema. He reports that his swelling is doing better. He is using very short compression socks. He reports that he is going to have a left total knee replacement in October. His symptoms of shortness of breath have resolved. Lower extremity venous Dopplers in April showed no DVT. His echocardiogram showed normal LV function. He had aortic valve sclerosis. There was mild to moderate mitral insufficiency.  Current Outpatient Prescriptions on File Prior to Visit  Medication Sig Dispense Refill  . aspirin 81 MG tablet Take 81 mg by mouth daily.      . B Complex Vitamins (VITAMIN B COMPLEX PO) Take by mouth.      . dutasteride (AVODART) 0.5 MG capsule Take 0.5 mg by mouth daily.      Marland Kitchen ezetimibe (ZETIA) 10 MG tablet Take 10 mg by mouth daily.      . folic acid (FOLVITE) 1 MG tablet Take 1 mg by mouth daily.      Marland Kitchen GARLIC PO Take by mouth.      Marland Kitchen GLUCOSAMINE-CHONDROITIN PO Take by mouth.      . Multiple Vitamin (MULTIVITAMIN) tablet Take 1 tablet by mouth daily.      . Saw Palmetto, Serenoa repens, (SAW PALMETTO PO) Take by mouth.      . sulfaSALAzine (AZULFIDINE) 500 MG tablet Take 500 mg by mouth daily.      . tamsulosin (FLOMAX) 0.4 MG CAPS Take 0.4 mg by mouth as directed.       No current facility-administered medications on file prior to visit.    No Known Allergies  Past Medical History  Diagnosis Date  . Peripheral neuropathy     Elevated by Dr. love 2010  . Essential hypertension   . Hyperlipidemia   . Essential tremor   . Obstructive uropathy   . Hypotonic bladder     L4-L5  . Prostate cancer 2005  . Edema     Past Surgical History  Procedure Laterality Date  . Skin cancer excision  1974    On chest wall  . Achilles tendon surgery  1995  . Colonoscopy      With polyp resection  . Total knee arthroplasty  2011    History   Smoking status  . Never Smoker   Smokeless tobacco  . Not on file    History  Alcohol Use: Not on file    Family History  Problem Relation Age of Onset  . Hypertension Mother   . Heart failure Mother   . Diabetes Mother     Review of Systems: As noted in history of present illness.  All other systems were reviewed and are negative.  Physical Exam: BP 122/64  Pulse 86  Ht 5' 9.5" (1.765 m)  Wt 184 lb 12.8 oz (83.825 kg)  BMI 26.91 kg/m2  SpO2 95% He is a pleasant white male who appears younger than his stated age. HEENT: Normal. He has no JVD, adenopathy, thyromegaly, or bruits. Lungs: Clear Cardiovascular: Regular rate and rhythm, normal S1 and S2, grade 1/6 systolic murmur at the left upper sternal border. No diastolic murmur or S3. Abdomen: Overweight, soft, nontender. No masses or hepatosplenomegaly. Extremities: Femoral and pedal pulses are 2+. He has trace edema bilaterally. He has prominent venous varicosities Skin: Warm and dry Neuro: Alert and oriented x3. Cranial nerves II through XII are intact.  LABORATORY DATA:  Lower extremity venous Dopplers were normal without evidence of DVT. Echocardiogram demonstrated moderate focal basal hypertrophy of the septum. Ejection fraction was normal at 55-60%. The aortic valve was sclerotic without stenosis. There was moderate left atrial enlargement and mild to moderate mitral insufficiency. Pulmonary artery pressure was normal.  Assessment / Plan: 1. Edema. Secondary to venous insufficiency. His edema resolves at night with elevation. His echocardiogram was fairly unremarkable.  I recommended conservative measures with elevation of his feet when possible, sodium restriction, and use of compression stockings.  2. Hypercholesterolemia. Patient is intolerant to statins. He is currently on Zetia.

## 2013-02-14 ENCOUNTER — Other Ambulatory Visit: Payer: Self-pay

## 2013-02-14 DIAGNOSIS — B079 Viral wart, unspecified: Secondary | ICD-10-CM | POA: Diagnosis not present

## 2013-02-14 DIAGNOSIS — L819 Disorder of pigmentation, unspecified: Secondary | ICD-10-CM | POA: Diagnosis not present

## 2013-02-14 DIAGNOSIS — C44319 Basal cell carcinoma of skin of other parts of face: Secondary | ICD-10-CM | POA: Diagnosis not present

## 2013-02-14 DIAGNOSIS — D2360 Other benign neoplasm of skin of unspecified upper limb, including shoulder: Secondary | ICD-10-CM | POA: Diagnosis not present

## 2013-02-14 DIAGNOSIS — C4441 Basal cell carcinoma of skin of scalp and neck: Secondary | ICD-10-CM | POA: Diagnosis not present

## 2013-02-14 DIAGNOSIS — D485 Neoplasm of uncertain behavior of skin: Secondary | ICD-10-CM | POA: Diagnosis not present

## 2013-03-07 DIAGNOSIS — Q828 Other specified congenital malformations of skin: Secondary | ICD-10-CM | POA: Diagnosis not present

## 2013-03-07 DIAGNOSIS — M216X9 Other acquired deformities of unspecified foot: Secondary | ICD-10-CM | POA: Diagnosis not present

## 2013-03-23 NOTE — Progress Notes (Signed)
Need orders in EPIC.  Surgery scheduled for 04/09/13.  preop on 04/04/13 at 230pm.  Thank You.

## 2013-03-26 ENCOUNTER — Other Ambulatory Visit: Payer: Self-pay | Admitting: Orthopedic Surgery

## 2013-03-28 ENCOUNTER — Ambulatory Visit (INDEPENDENT_AMBULATORY_CARE_PROVIDER_SITE_OTHER): Payer: Medicare Other

## 2013-03-28 VITALS — BP 134/83 | HR 77 | Temp 98.4°F | Resp 16

## 2013-03-28 DIAGNOSIS — M25569 Pain in unspecified knee: Secondary | ICD-10-CM | POA: Diagnosis not present

## 2013-03-28 DIAGNOSIS — Q828 Other specified congenital malformations of skin: Secondary | ICD-10-CM | POA: Diagnosis not present

## 2013-03-28 DIAGNOSIS — B070 Plantar wart: Secondary | ICD-10-CM | POA: Diagnosis not present

## 2013-03-28 DIAGNOSIS — I10 Essential (primary) hypertension: Secondary | ICD-10-CM | POA: Diagnosis not present

## 2013-03-28 NOTE — Patient Instructions (Signed)

## 2013-03-28 NOTE — Progress Notes (Signed)
Left foot is doing better, pad helped on the insert. N aches L callus 1st met. right D 2weeks O suddenly C keeps getting a little worse every week A walking T no tx

## 2013-03-28 NOTE — Progress Notes (Signed)
Subjective:     Patient ID: [name] [name], male   DOB: 1928/06/18, 79 y.o.   MRN: [id number]  HPI patient presents with a new problem. Describes a hard callus lesion beneath the first metatarsal head of his right foot. Patient did present for less than a month is friable and tender on ambulation and palpation. Patient has been under treatment for porokeratosis sub-fourth met left foot.  Review of Systems  Constitutional: Negative.   HENT: Positive for hearing loss.   Gastrointestinal: Negative.   Psychiatric/Behavioral: Negative.        Objective:   Physical Exam  Constitutional: He is oriented to person, place, and time. He appears well-developed and well-nourished.  HENT:  Head: Normocephalic and atraumatic.  Musculoskeletal:       Right foot: Normal.       Left foot: Normal.  Neurological: He is oriented to person, place, and time. He has normal reflexes.  Skin: Skin is warm and dry. No lesion noted. No cyanosis. Nails show no clubbing.  Patient presents with a friable keratotic lesion plantar to the first MTP area right foot painful on palpation and ambulation. Patient has a healed porokeratosis sub-fourth met left foot.  The verrucoid lesion is friable with pinpoint bleeding and debridement. The lesion was debrided and triple antibiotic solution applied. The lesion does not appear to be associated with bony abnormality  Psychiatric: He has a normal mood and affect. His behavior is normal. Judgment normal.       Assessment:     Verruca plantaris versus poor keratoses plantar right foot    Plan:     The skin lesion was debrided and dressed, instructions for wart treatment utilizing topical salicylic acid was dispensed. Maintain Plastazote insoles in the shoes. Followup on an as-needed basis for palliative care  Harriet Masson DPM

## 2013-03-31 DIAGNOSIS — Z23 Encounter for immunization: Secondary | ICD-10-CM | POA: Diagnosis not present

## 2013-04-04 ENCOUNTER — Encounter (HOSPITAL_COMMUNITY): Payer: Self-pay

## 2013-04-04 ENCOUNTER — Encounter (HOSPITAL_COMMUNITY)
Admission: RE | Admit: 2013-04-04 | Discharge: 2013-04-04 | Disposition: A | Discharge status: Home / Self Care | Payer: Medicare Other | Source: Ambulatory Visit | Attending: Orthopedic Surgery | Admitting: Orthopedic Surgery

## 2013-04-04 ENCOUNTER — Ambulatory Visit: Payer: Self-pay

## 2013-04-04 ENCOUNTER — Encounter (HOSPITAL_COMMUNITY): Payer: Self-pay | Admitting: Pharmacy Technician

## 2013-04-04 DIAGNOSIS — Z85828 Personal history of other malignant neoplasm of skin: Secondary | ICD-10-CM | POA: Diagnosis not present

## 2013-04-04 DIAGNOSIS — Z01818 Encounter for other preprocedural examination: Secondary | ICD-10-CM | POA: Insufficient documentation

## 2013-04-04 DIAGNOSIS — Z01812 Encounter for preprocedural laboratory examination: Secondary | ICD-10-CM | POA: Diagnosis not present

## 2013-04-04 DIAGNOSIS — D485 Neoplasm of uncertain behavior of skin: Secondary | ICD-10-CM | POA: Diagnosis not present

## 2013-04-04 LAB — COMPREHENSIVE METABOLIC PANEL
ALT: 19 U/L (ref 0–53)
AST: 32 U/L (ref 0–37)
Albumin: 3.7 g/dL (ref 3.5–5.2)
Alkaline Phosphatase: 40 U/L (ref 39–117)
BUN: 21 mg/dL (ref 6–23)
CO2: 25 mEq/L (ref 19–32)
Calcium: 9 mg/dL (ref 8.4–10.5)
Chloride: 102 mEq/L (ref 96–112)
Creatinine, Ser: 1.25 mg/dL (ref 0.50–1.35)
GFR calc Af Amer: 60 mL/min — ABNORMAL LOW (ref >90)
GFR calc non Af Amer: 51 mL/min — ABNORMAL LOW (ref >90)
Glucose, Bld: 101 mg/dL — ABNORMAL HIGH (ref 70–99)
Potassium: 4.2 mEq/L (ref 3.5–5.1)
Sodium: 137 mEq/L (ref 135–145)
Total Bilirubin: 0.6 mg/dL (ref 0.3–1.2)
Total Protein: 6.6 g/dL (ref 6.0–8.3)

## 2013-04-04 LAB — SURGICAL PCR SCREEN
MRSA, PCR: NEGATIVE
Staphylococcus aureus: NEGATIVE

## 2013-04-04 LAB — CBC
HCT: 39.9 % (ref 39.0–52.0)
Hemoglobin: 13.5 g/dL (ref 13.0–17.0)
MCH: 31 pg (ref 26.0–34.0)
MCHC: 33.8 g/dL (ref 30.0–36.0)
MCV: 91.7 fL (ref 78.0–100.0)
Platelets: 135 10*3/uL — ABNORMAL LOW (ref 150–400)
RBC: 4.35 MIL/uL (ref 4.22–5.81)
RDW: 13.7 % (ref 11.5–15.5)
WBC: 6.4 10*3/uL (ref 4.0–10.5)

## 2013-04-04 LAB — URINALYSIS, ROUTINE W REFLEX MICROSCOPIC
Bilirubin Urine: NEGATIVE
Glucose, UA: NEGATIVE mg/dL
Hgb urine dipstick: NEGATIVE
Ketones, ur: NEGATIVE mg/dL
Nitrite: POSITIVE — AB
Protein, ur: NEGATIVE mg/dL
Specific Gravity, Urine: 1.021 (ref 1.005–1.030)
Urobilinogen, UA: 0.2 mg/dL (ref 0.0–1.0)
pH: 6 (ref 5.0–8.0)

## 2013-04-04 LAB — PROTIME-INR
INR: 1.02 (ref 0.00–1.49)
Prothrombin Time: 13.2 seconds (ref 11.6–15.2)

## 2013-04-04 LAB — URINE MICROSCOPIC-ADD ON

## 2013-04-04 LAB — APTT: aPTT: 33 seconds (ref 24–37)

## 2013-04-04 NOTE — Patient Instructions (Addendum)
[place]  04/04/2013   Your procedure is scheduled on  10-13 :   -2014  Report to [facility] at    1045    AM.  Call this number if you have problems the morning of surgery: [phone]  Or Presurgical Testing [phone](Thressa Shiffer)   Do not eat food:After Midnight.  May have clear liquids:up to 6 Hours before arrival. Nothing after : 0700 AM  Clear liquids include soda, tea, black coffee, apple or grape juice, broth.  Take these medicines the morning of surgery with A SIP OF WATER: Zetia. Tamsulosin. Avodart.   Do not wear jewelry, make-up or nail polish.  Do not wear lotions, powders, or perfumes. You may wear deodorant.  Do not shave 12 hours prior to first CHG shower(legs and under arms).(face and neck okay.)  Do not bring valuables to the hospital.  Contacts, dentures or bridgework,body piercing,  may not be worn into surgery.  Leave suitcase in the car. After surgery it may be brought to your room.  For patients admitted to the hospital, checkout time is 11:00 AM the day of discharge.   Patients discharged the day of surgery will not be allowed to drive home. Must have responsible person with you x 24 hours once discharged.  Name and phone number of your driver: Tomi Bamberger- spouse [phone] h  Special Instructions: CHG(Chlorhedine 4%-"Hibiclens","Betasept","Aplicare") Shower Use Special Wash: see special instructions.(avoid face and genitals)   Please read over the following fact sheets that you were given: MRSA Information, Blood Transfusion fact sheet, Incentive Spirometry Instruction.    Failure to follow these instructions may result in Cancellation of your surgery.   Patient signature_______________________________________________________

## 2013-04-04 NOTE — Pre-Procedure Instructions (Addendum)
04-04-13 EKG 3'14, 10'14,CXR 3'14-reports with chart. 04-05-13 0830 labs viewable in Epic, note to review urine.W. Floy Sabina

## 2013-04-05 DIAGNOSIS — C61 Malignant neoplasm of prostate: Secondary | ICD-10-CM | POA: Diagnosis not present

## 2013-04-05 DIAGNOSIS — N319 Neuromuscular dysfunction of bladder, unspecified: Secondary | ICD-10-CM | POA: Diagnosis not present

## 2013-04-05 NOTE — Progress Notes (Signed)
04-05-13 0830 labs viewable in Epic, please note urine.

## 2013-04-06 LAB — URINE CULTURE: Colony Count: >=100000

## 2013-04-08 ENCOUNTER — Other Ambulatory Visit: Payer: Self-pay | Admitting: Orthopedic Surgery

## 2013-04-08 NOTE — H&P (Signed)
[name] [name]  DOB: 1927/11/22 Married / Language: English / Race: White Male  Date of Admission:  04/09/2013  Chief Complaint:  Left Knee Pain  History of Present Illness The patient is a 79 year old male who comes in for a preoperative History and Physical. The patient is scheduled for a left total knee arthroplasty to be performed by Dr. Dione Plover. Aluisio, MD at [facility] on 04/09/2013. The patient is a 79 year old male who presents for follow up of their knee. The patient is being followed for their left knee pain and osteoarthritis. Symptoms reported today include: pain. The patient feels that they are doing poorly and report their pain level to be mild to moderate (varies in severity). The following medication has been used for pain control: none. The patient presents weeks post cortisone injection. It unfortuantely did not help. Unfortunately the knee is getting progressively worse for him. He is having pain at all times limiting what he can and can not do. He does not get swelling. It is not giving out on him. He is now ready to get the knee fixed. They have been treated conservatively in the past for the above stated problem and despite conservative measures, they continue to have progressive pain and severe functional limitations and dysfunction. They have failed non-operative management including home exercise, medications, and injections. It is felt that they would benefit from undergoing total joint replacement. Risks and benefits of the procedure have been discussed with the patient and they elect to proceed with surgery. There are no active contraindications to surgery such as ongoing infection or rapidly progressive neurological disease.   Problem List Primary osteoarthritis of one knee (715.16) S/P total knee replacement (V43.65). right   Allergies Statins - he is not sure which "statin" he is allergic to, muscle pain    Family History Diabetes  Mellitus. mother Congestive Heart Failure. mother Heart Disease. mother, father and brother Heart disease in male family member before age 19 Heart disease in male family member before age 72 Cerebrovascular Accident. father Cancer. sister and brother Osteoarthritis. mother, father and sister Hypertension. mother, father and brother Osteoporosis. father Rheumatoid Arthritis. father and sister    Social History Drug/Alcohol Rehab (Currently). no Current work status. working part time Drug/Alcohol Rehab (Previously). no Illicit drug use. no Exercise. Exercises weekly; does running / walking and gym / weights Children. 2 Alcohol use. current drinker; drinks beer and wine; only occasionally per week Tobacco use. never smoker Marital status. married Living situation. live with spouse Number of flights of stairs before winded. greater than 5 Tobacco / smoke exposure. no Pain Contract. no Post-Surgical Plans. Home with wife Advance Directives. Living Will, Healthcare POA    Medication History SulfaSALAzine (500MG  Tablet, Oral) Active. Tamsulosin HCl (0.4MG  Capsule, Oral) Active. Avodart (0.5MG  Capsule, Oral) Active. Folic Acid (1MG  Tablet, Oral) Active. Aspirin (81MG  Tablet, Oral) Active. Benicar (20MG  Tablet, Oral) Active. Zetia (10MG  Tablet, Oral) Active.    Past Surgical History Colon Polyp Removal - Colonoscopy Cataract Surgery. bilateral Total Knee Replacement. Date: 10/2009. right    Medical History Skin Cancer Prostate Disease. Cancer Sleep Apnea Osteoarthritis Hypercholesterolemia Prostate Cancer Peripheral Neuropathy High blood pressure Bronchitis Cataract Hemorrhoids Measles Scarlet Fever Ulcerative Colitis    Review of Systems General:Not Present- Chills, Fever, Night Sweats, Fatigue, Weight Gain, Weight Loss and Memory Loss. Skin:Not Present- Hives, Itching, Rash, Eczema and Lesions. HEENT:Not  Present- Tinnitus, Headache, Double Vision, Visual Loss, Hearing Loss and Dentures. Respiratory:Present- Shortness of breath  at rest. Not Present- Shortness of breath with exertion, Allergies, Coughing up blood and Chronic Cough. Cardiovascular:Not Present- Chest Pain, Racing/skipping heartbeats, Difficulty Breathing Lying Down, Murmur, Swelling and Palpitations. Gastrointestinal:Not Present- Bloody Stool, Heartburn, Abdominal Pain, Vomiting, Nausea, Constipation, Diarrhea, Difficulty Swallowing, Jaundice and Loss of appetitie. Male Genitourinary:Present- Urinary Retention. Not Present- Urinary frequency, Blood in Urine, Weak urinary stream, Discharge, Flank Pain, Incontinence, Painful Urination, Urgency and Urinating at Night. Musculoskeletal:Present- Joint Swelling and Joint Pain. Not Present- Muscle Weakness, Muscle Pain, Back Pain, Morning Stiffness and Spasms. Neurological:Not Present- Tremor, Dizziness, Blackout spells, Paralysis, Difficulty with balance and Weakness. Psychiatric:Not Present- Insomnia.    Vitals Pulse: 76 (Regular) Resp.: 14 (Unlabored) BP: 104/58 (Sitting, Right Arm, Standard)    Physical Exam The physical exam findings are as follows:   General Mental Status - Alert, cooperative and good historian. General Appearance- pleasant. Not in acute distress. Orientation- Oriented X3. Build & Nutrition- Well nourished and Well developed.   Head and Neck Head- normocephalic, atraumatic . Neck Global Assessment- supple. no bruit auscultated on the right and no bruit auscultated on the left.   Eye Vision- Wears corrective lenses. Pupil- Bilateral- Regular and Round. Motion- Bilateral- EOMI.   Chest and Lung Exam Auscultation: Breath sounds:- clear at anterior chest wall and - clear at posterior chest wall. Adventitious sounds:- No Adventitious sounds.   Cardiovascular Auscultation:Rhythm- Regular rate and rhythm. Heart  Sounds- S1 WNL and S2 WNL. Murmurs & Other Heart Sounds:Auscultation of the heart reveals - No Murmurs.   Abdomen Palpation/Percussion:Tenderness- Abdomen is non-tender to palpation. Rigidity (guarding)- Abdomen is soft. Auscultation:Auscultation of the abdomen reveals - Bowel sounds normal.   Male Genitourinary Not done, not pertinent to present illness  Musculoskeletal  Well developed male in no distress. His left knee shows no effusion. He has range about 5 to 125 with marked crepitus on range of motion. Tender medial greater than lateral with no instability.  RADIOGRAPHS: He is bone on bone in the medial and patellofemoral compartments.   Assessment & Plan Primary osteoarthritis of one knee (715.16) Impression: Left Knee  Note: Plan is for a Left Total Knee Replacement by Dr. Wynelle Link.  Plan is to go home with wife.  PCP - Dr. [name]  The patient will not receive TXA (tranexamic acid) due to: Prostate Cancer  Signed electronically by Alexzandrew Monika Salk, III PA-C

## 2013-04-09 ENCOUNTER — Encounter (HOSPITAL_COMMUNITY)
Admission: RE | Disposition: A | Discharge status: Home Health | Payer: Self-pay | Source: Ambulatory Visit | Attending: Orthopedic Surgery

## 2013-04-09 ENCOUNTER — Inpatient Hospital Stay (HOSPITAL_COMMUNITY): Payer: Medicare Other | Admitting: Anesthesiology

## 2013-04-09 ENCOUNTER — Inpatient Hospital Stay (HOSPITAL_COMMUNITY)
Admission: RE | Admit: 2013-04-09 | Discharge: 2013-04-11 | DRG: 470 | Disposition: A | Discharge status: Home Health | Payer: Medicare Other | Source: Ambulatory Visit | Attending: Orthopedic Surgery | Admitting: Orthopedic Surgery

## 2013-04-09 ENCOUNTER — Encounter (HOSPITAL_COMMUNITY): Payer: Self-pay | Admitting: *Deleted

## 2013-04-09 ENCOUNTER — Encounter (HOSPITAL_COMMUNITY): Payer: Medicare Other | Admitting: Anesthesiology

## 2013-04-09 DIAGNOSIS — G609 Hereditary and idiopathic neuropathy, unspecified: Secondary | ICD-10-CM | POA: Diagnosis present

## 2013-04-09 DIAGNOSIS — E7800 Pure hypercholesterolemia, unspecified: Secondary | ICD-10-CM | POA: Diagnosis present

## 2013-04-09 DIAGNOSIS — I10 Essential (primary) hypertension: Secondary | ICD-10-CM | POA: Diagnosis present

## 2013-04-09 DIAGNOSIS — Z96659 Presence of unspecified artificial knee joint: Secondary | ICD-10-CM

## 2013-04-09 DIAGNOSIS — K5190 Ulcerative colitis, unspecified, without complications: Secondary | ICD-10-CM | POA: Diagnosis not present

## 2013-04-09 DIAGNOSIS — G4730 Sleep apnea, unspecified: Secondary | ICD-10-CM | POA: Diagnosis present

## 2013-04-09 DIAGNOSIS — M1710 Unilateral primary osteoarthritis, unspecified knee: Secondary | ICD-10-CM | POA: Diagnosis not present

## 2013-04-09 DIAGNOSIS — C61 Malignant neoplasm of prostate: Secondary | ICD-10-CM | POA: Diagnosis not present

## 2013-04-09 DIAGNOSIS — Z79899 Other long term (current) drug therapy: Secondary | ICD-10-CM | POA: Diagnosis not present

## 2013-04-09 DIAGNOSIS — IMO0002 Reserved for concepts with insufficient information to code with codable children: Secondary | ICD-10-CM | POA: Diagnosis not present

## 2013-04-09 DIAGNOSIS — E785 Hyperlipidemia, unspecified: Secondary | ICD-10-CM | POA: Diagnosis present

## 2013-04-09 DIAGNOSIS — M179 Osteoarthritis of knee, unspecified: Secondary | ICD-10-CM | POA: Diagnosis present

## 2013-04-09 DIAGNOSIS — Z96652 Presence of left artificial knee joint: Secondary | ICD-10-CM

## 2013-04-09 HISTORY — PX: TOTAL KNEE ARTHROPLASTY: SHX125

## 2013-04-09 LAB — TYPE AND SCREEN
ABO/RH(D): A POS
Antibody Screen: NEGATIVE

## 2013-04-09 SURGERY — ARTHROPLASTY, KNEE, TOTAL
Anesthesia: Spinal | Site: Knee | Laterality: Left | Wound class: Clean

## 2013-04-09 MED ORDER — DEXAMETHASONE SODIUM PHOSPHATE 10 MG/ML IJ SOLN
10.0000 mg | Freq: Every day | INTRAMUSCULAR | Status: AC
  Filled 2013-04-09: qty 1

## 2013-04-09 MED ORDER — SODIUM CHLORIDE 0.9 % IV SOLN
INTRAVENOUS | Status: DC
  Administered 2013-04-09: 1000 mL via INTRAVENOUS
  Administered 2013-04-10: 01:00:00 via INTRAVENOUS

## 2013-04-09 MED ORDER — BUPIVACAINE HCL (PF) 0.25 % IJ SOLN
INTRAMUSCULAR | Status: AC
  Filled 2013-04-09: qty 30

## 2013-04-09 MED ORDER — EZETIMIBE 10 MG PO TABS
10.0000 mg | ORAL_TABLET | Freq: Every morning | ORAL | Status: DC
  Administered 2013-04-10 – 2013-04-11 (×2): 10 mg via ORAL
  Filled 2013-04-09 (×2): qty 1

## 2013-04-09 MED ORDER — KETOROLAC TROMETHAMINE 15 MG/ML IJ SOLN: 7.5000 mg | Freq: Four times a day (QID) | INTRAMUSCULAR | Status: AC | PRN

## 2013-04-09 MED ORDER — DEXAMETHASONE 6 MG PO TABS
10.0000 mg | ORAL_TABLET | Freq: Every day | ORAL | Status: AC
  Administered 2013-04-10: 10 mg via ORAL
  Filled 2013-04-09: qty 1

## 2013-04-09 MED ORDER — ACETAMINOPHEN 500 MG PO TABS
1000.0000 mg | ORAL_TABLET | Freq: Four times a day (QID) | ORAL | Status: AC
  Administered 2013-04-10 (×2): 1000 mg via ORAL
  Filled 2013-04-09 (×4): qty 2

## 2013-04-09 MED ORDER — RIVAROXABAN 10 MG PO TABS
10.0000 mg | ORAL_TABLET | Freq: Every day | ORAL | Status: DC
  Administered 2013-04-10 – 2013-04-11 (×2): 10 mg via ORAL
  Filled 2013-04-09 (×3): qty 1

## 2013-04-09 MED ORDER — DUTASTERIDE 0.5 MG PO CAPS
0.5000 mg | ORAL_CAPSULE | Freq: Every day | ORAL | Status: DC
  Administered 2013-04-10 – 2013-04-11 (×2): 0.5 mg via ORAL
  Filled 2013-04-09 (×2): qty 1

## 2013-04-09 MED ORDER — LACTATED RINGERS IV SOLN
INTRAVENOUS | Status: DC
  Administered 2013-04-09: 1000 mL via INTRAVENOUS

## 2013-04-09 MED ORDER — POLYETHYLENE GLYCOL 3350 17 G PO PACK: 17.0000 g | PACK | Freq: Every day | ORAL | Status: DC | PRN

## 2013-04-09 MED ORDER — MENTHOL 3 MG MT LOZG
1.0000 | LOZENGE | OROMUCOSAL | Status: DC | PRN
  Filled 2013-04-09: qty 9

## 2013-04-09 MED ORDER — TAMSULOSIN HCL 0.4 MG PO CAPS
0.4000 mg | ORAL_CAPSULE | Freq: Every day | ORAL | Status: DC
  Administered 2013-04-10 – 2013-04-11 (×2): 0.4 mg via ORAL
  Filled 2013-04-09 (×2): qty 1

## 2013-04-09 MED ORDER — SODIUM CHLORIDE 0.9 % IJ SOLN
INTRAMUSCULAR | Status: DC | PRN
  Administered 2013-04-09: 30 mL via INTRAVENOUS

## 2013-04-09 MED ORDER — BUPIVACAINE IN DEXTROSE 0.75-8.25 % IT SOLN
INTRATHECAL | Status: DC | PRN
  Administered 2013-04-09: 2 mL via INTRATHECAL

## 2013-04-09 MED ORDER — METOCLOPRAMIDE HCL 10 MG PO TABS: 5.0000 mg | ORAL_TABLET | Freq: Three times a day (TID) | ORAL | Status: DC | PRN

## 2013-04-09 MED ORDER — PROPOFOL INFUSION 10 MG/ML OPTIME
INTRAVENOUS | Status: DC | PRN
  Administered 2013-04-09: 50 ug/kg/min via INTRAVENOUS

## 2013-04-09 MED ORDER — SULFASALAZINE 500 MG PO TABS
2000.0000 mg | ORAL_TABLET | Freq: Every day | ORAL | Status: DC
  Administered 2013-04-10 – 2013-04-11 (×2): 2000 mg via ORAL
  Filled 2013-04-09 (×2): qty 4

## 2013-04-09 MED ORDER — KETAMINE HCL 50 MG/ML IJ SOLN
INTRAMUSCULAR | Status: DC | PRN
  Administered 2013-04-09: 25 mg via INTRAMUSCULAR

## 2013-04-09 MED ORDER — MORPHINE SULFATE 2 MG/ML IJ SOLN: 1.0000 mg | INTRAMUSCULAR | Status: DC | PRN

## 2013-04-09 MED ORDER — METHOCARBAMOL 100 MG/ML IJ SOLN
500.0000 mg | Freq: Four times a day (QID) | INTRAVENOUS | Status: DC | PRN
  Filled 2013-04-09: qty 5

## 2013-04-09 MED ORDER — MIDAZOLAM HCL 5 MG/5ML IJ SOLN
INTRAMUSCULAR | Status: DC | PRN
  Administered 2013-04-09: 2 mg via INTRAVENOUS

## 2013-04-09 MED ORDER — HYDROMORPHONE HCL PF 1 MG/ML IJ SOLN: 0.2500 mg | INTRAMUSCULAR | Status: DC | PRN

## 2013-04-09 MED ORDER — PROMETHAZINE HCL 25 MG/ML IJ SOLN: 6.2500 mg | INTRAMUSCULAR | Status: DC | PRN

## 2013-04-09 MED ORDER — PHENOL 1.4 % MT LIQD: 1.0000 | OROMUCOSAL | Status: DC | PRN

## 2013-04-09 MED ORDER — BUPIVACAINE LIPOSOME 1.3 % IJ SUSP
INTRAMUSCULAR | Status: DC | PRN
  Administered 2013-04-09: 20 mL

## 2013-04-09 MED ORDER — FLEET ENEMA 7-19 GM/118ML RE ENEM: 1.0000 | ENEMA | Freq: Once | RECTAL | Status: AC | PRN

## 2013-04-09 MED ORDER — CHLORHEXIDINE GLUCONATE 4 % EX LIQD
60.0000 mL | Freq: Once | CUTANEOUS | Status: DC
  Filled 2013-04-09: qty 60

## 2013-04-09 MED ORDER — FENTANYL CITRATE 0.05 MG/ML IJ SOLN
INTRAMUSCULAR | Status: DC | PRN
  Administered 2013-04-09: 100 ug via INTRAVENOUS

## 2013-04-09 MED ORDER — TRAMADOL HCL 50 MG PO TABS: 50.0000 mg | ORAL_TABLET | Freq: Four times a day (QID) | ORAL | Status: DC | PRN

## 2013-04-09 MED ORDER — SODIUM CHLORIDE 0.9 % IV SOLN: INTRAVENOUS | Status: DC

## 2013-04-09 MED ORDER — DOCUSATE SODIUM 100 MG PO CAPS
100.0000 mg | ORAL_CAPSULE | Freq: Two times a day (BID) | ORAL | Status: DC
  Administered 2013-04-09 – 2013-04-11 (×4): 100 mg via ORAL

## 2013-04-09 MED ORDER — DIPHENHYDRAMINE HCL 12.5 MG/5ML PO ELIX: 12.5000 mg | ORAL_SOLUTION | ORAL | Status: DC | PRN

## 2013-04-09 MED ORDER — ONDANSETRON HCL 4 MG/2ML IJ SOLN: 4.0000 mg | Freq: Four times a day (QID) | INTRAMUSCULAR | Status: DC | PRN

## 2013-04-09 MED ORDER — ACETAMINOPHEN 500 MG PO TABS
1000.0000 mg | ORAL_TABLET | Freq: Once | ORAL | Status: AC
  Administered 2013-04-09: 1000 mg via ORAL
  Filled 2013-04-09: qty 2

## 2013-04-09 MED ORDER — IRBESARTAN 150 MG PO TABS
150.0000 mg | ORAL_TABLET | Freq: Every day | ORAL | Status: DC
  Administered 2013-04-10 – 2013-04-11 (×2): 150 mg via ORAL
  Filled 2013-04-09 (×2): qty 1

## 2013-04-09 MED ORDER — OXYCODONE HCL 5 MG PO TABS
5.0000 mg | ORAL_TABLET | ORAL | Status: DC | PRN
  Administered 2013-04-10 (×4): 5 mg via ORAL
  Administered 2013-04-11: 10 mg via ORAL
  Filled 2013-04-09 (×3): qty 1
  Filled 2013-04-09: qty 2
  Filled 2013-04-09: qty 1

## 2013-04-09 MED ORDER — BISACODYL 10 MG RE SUPP: 10.0000 mg | Freq: Every day | RECTAL | Status: DC | PRN

## 2013-04-09 MED ORDER — CEFAZOLIN SODIUM 1-5 GM-% IV SOLN
1.0000 g | Freq: Four times a day (QID) | INTRAVENOUS | Status: AC
  Administered 2013-04-09 – 2013-04-10 (×2): 1 g via INTRAVENOUS
  Filled 2013-04-09 (×2): qty 50

## 2013-04-09 MED ORDER — ONDANSETRON HCL 4 MG PO TABS: 4.0000 mg | ORAL_TABLET | Freq: Four times a day (QID) | ORAL | Status: DC | PRN

## 2013-04-09 MED ORDER — METOCLOPRAMIDE HCL 5 MG/ML IJ SOLN: 5.0000 mg | Freq: Three times a day (TID) | INTRAMUSCULAR | Status: DC | PRN

## 2013-04-09 MED ORDER — LACTATED RINGERS IV SOLN: INTRAVENOUS | Status: DC

## 2013-04-09 MED ORDER — CEFAZOLIN SODIUM-DEXTROSE 2-3 GM-% IV SOLR
2.0000 g | INTRAVENOUS | Status: AC
  Administered 2013-04-09: 2 g via INTRAVENOUS

## 2013-04-09 MED ORDER — DEXAMETHASONE SODIUM PHOSPHATE 10 MG/ML IJ SOLN
10.0000 mg | Freq: Once | INTRAMUSCULAR | Status: AC
  Administered 2013-04-09: 10 mg via INTRAVENOUS

## 2013-04-09 MED ORDER — CEFAZOLIN SODIUM-DEXTROSE 2-3 GM-% IV SOLR
INTRAVENOUS | Status: AC
  Filled 2013-04-09: qty 50

## 2013-04-09 MED ORDER — BUPIVACAINE LIPOSOME 1.3 % IJ SUSP
20.0000 mL | Freq: Once | INTRAMUSCULAR | Status: DC
  Filled 2013-04-09: qty 20

## 2013-04-09 MED ORDER — METHOCARBAMOL 500 MG PO TABS
500.0000 mg | ORAL_TABLET | Freq: Four times a day (QID) | ORAL | Status: DC | PRN
  Administered 2013-04-11: 500 mg via ORAL
  Filled 2013-04-09: qty 1

## 2013-04-09 MED ORDER — BUPIVACAINE HCL 0.25 % IJ SOLN
INTRAMUSCULAR | Status: DC | PRN
  Administered 2013-04-09: 20 mL

## 2013-04-09 MED ORDER — FOLIC ACID 1 MG PO TABS
1.0000 mg | ORAL_TABLET | Freq: Every day | ORAL | Status: DC
  Administered 2013-04-10 – 2013-04-11 (×2): 1 mg via ORAL
  Filled 2013-04-09 (×2): qty 1

## 2013-04-09 SURGICAL SUPPLY — 58 items
BAG ZIPLOCK 12X15 (MISCELLANEOUS) ×2 IMPLANT
BANDAGE ELASTIC 6 VELCRO ST LF (GAUZE/BANDAGES/DRESSINGS) ×2 IMPLANT
BANDAGE ESMARK 6X9 LF (GAUZE/BANDAGES/DRESSINGS) ×1 IMPLANT
BLADE SAG 18X100X1.27 (BLADE) ×2 IMPLANT
BLADE SAW SGTL 11.0X1.19X90.0M (BLADE) ×2 IMPLANT
BNDG CMPR 9X6 STRL LF SNTH (GAUZE/BANDAGES/DRESSINGS) ×1
BNDG ESMARK 6X9 LF (GAUZE/BANDAGES/DRESSINGS) ×2
BOWL SMART MIX CTS (DISPOSABLE) ×2 IMPLANT
CAPT RP KNEE ×2 IMPLANT
CEMENT HV SMART SET (Cement) ×4 IMPLANT
CLOSURE STERI-STRIP 1/4X4 (GAUZE/BANDAGES/DRESSINGS) ×2 IMPLANT
CLOTH BEACON ORANGE TIMEOUT ST (SAFETY) ×2 IMPLANT
CUFF TOURN SGL QUICK 34 (TOURNIQUET CUFF) ×2
CUFF TRNQT CYL 34X4X40X1 (TOURNIQUET CUFF) ×1 IMPLANT
DECANTER SPIKE VIAL GLASS SM (MISCELLANEOUS) ×2 IMPLANT
DRAPE EXTREMITY T 121X128X90 (DRAPE) ×2 IMPLANT
DRAPE POUCH INSTRU U-SHP 10X18 (DRAPES) ×2 IMPLANT
DRAPE U-SHAPE 47X51 STRL (DRAPES) ×2 IMPLANT
DRSG ADAPTIC 3X8 NADH LF (GAUZE/BANDAGES/DRESSINGS) ×2 IMPLANT
DRSG EMULSION OIL 3X16 NADH (GAUZE/BANDAGES/DRESSINGS) ×2 IMPLANT
DRSG PAD ABDOMINAL 8X10 ST (GAUZE/BANDAGES/DRESSINGS) ×2 IMPLANT
DURAPREP 26ML APPLICATOR (WOUND CARE) ×2 IMPLANT
ELECT REM PT RETURN 9FT ADLT (ELECTROSURGICAL) ×2
ELECTRODE REM PT RTRN 9FT ADLT (ELECTROSURGICAL) ×1 IMPLANT
EVACUATOR 1/8 PVC DRAIN (DRAIN) ×2 IMPLANT
FACESHIELD LNG OPTICON STERILE (SAFETY) ×10 IMPLANT
GLOVE BIO SURGEON STRL SZ7.5 (GLOVE) IMPLANT
GLOVE BIO SURGEON STRL SZ8 (GLOVE) ×2 IMPLANT
GLOVE BIOGEL PI IND STRL 8 (GLOVE) ×2 IMPLANT
GLOVE BIOGEL PI INDICATOR 8 (GLOVE) ×2
GLOVE SURG SS PI 6.5 STRL IVOR (GLOVE) IMPLANT
GOWN PREVENTION PLUS LG XLONG (DISPOSABLE) ×2 IMPLANT
GOWN STRL REIN XL XLG (GOWN DISPOSABLE) IMPLANT
HANDPIECE INTERPULSE COAX TIP (DISPOSABLE) ×1
IMMOBILIZER KNEE 20 (SOFTGOODS) ×2
IMMOBILIZER KNEE 20 THIGH 36 (SOFTGOODS) ×1 IMPLANT
KIT BASIN OR (CUSTOM PROCEDURE TRAY) ×2 IMPLANT
MANIFOLD NEPTUNE II (INSTRUMENTS) ×2 IMPLANT
NDL SAFETY ECLIPSE 18X1.5 (NEEDLE) ×2 IMPLANT
NEEDLE HYPO 18GX1.5 SHARP (NEEDLE) ×2
NS IRRIG 1000ML POUR BTL (IV SOLUTION) ×2 IMPLANT
PACK TOTAL JOINT (CUSTOM PROCEDURE TRAY) ×2 IMPLANT
PADDING CAST COTTON 6X4 STRL (CAST SUPPLIES) ×2 IMPLANT
POSITIONER SURGICAL ARM (MISCELLANEOUS) ×2 IMPLANT
SET HNDPC FAN SPRY TIP SCT (DISPOSABLE) ×1 IMPLANT
SPONGE GAUZE 4X4 12PLY (GAUZE/BANDAGES/DRESSINGS) ×2 IMPLANT
STRIP CLOSURE SKIN 1/2X4 (GAUZE/BANDAGES/DRESSINGS) ×4 IMPLANT
SUCTION FRAZIER 12FR DISP (SUCTIONS) ×2 IMPLANT
SUT MNCRL AB 4-0 PS2 18 (SUTURE) ×2 IMPLANT
SUT VIC AB 2-0 CT1 27 (SUTURE) ×3
SUT VIC AB 2-0 CT1 TAPERPNT 27 (SUTURE) ×3 IMPLANT
SUT VLOC 180 0 24IN GS25 (SUTURE) ×2 IMPLANT
SYR 20CC LL (SYRINGE) ×2 IMPLANT
SYR 50ML LL SCALE MARK (SYRINGE) ×2 IMPLANT
TOWEL OR 17X26 10 PK STRL BLUE (TOWEL DISPOSABLE) ×4 IMPLANT
TRAY FOLEY CATH 14FRSI W/METER (CATHETERS) ×2 IMPLANT
WATER STERILE IRR 1500ML POUR (IV SOLUTION) ×2 IMPLANT
WRAP KNEE MAXI GEL POST OP (GAUZE/BANDAGES/DRESSINGS) ×2 IMPLANT

## 2013-04-09 NOTE — Interval H&P Note (Signed)
History and Physical Interval Note:  04/09/2013 11:53 AM  [name] [name]  has presented today for surgery, with the diagnosis of Osteoarthritis of the Left knee  The various methods of treatment have been discussed with the patient and family. After consideration of risks, benefits and other options for treatment, the patient has consented to  Procedure(s): LEFT TOTAL KNEE ARTHROPLASTY (Left) as a surgical intervention .  The patient's history has been reviewed, patient examined, no change in status, stable for surgery.  I have reviewed the patient's chart and labs.  Questions were answered to the patient's satisfaction.     Gearlean Alf

## 2013-04-09 NOTE — Transfer of Care (Signed)
Immediate Anesthesia Transfer of Care Note  Patient: [name] [name]  Procedure(s) Performed: Procedure(s): LEFT TOTAL KNEE ARTHROPLASTY (Left)  Patient Location: PACU  Anesthesia Type:Regional and Spinal  Level of Consciousness: awake, alert , oriented and patient cooperative  Airway & Oxygen Therapy: Patient Spontanous Breathing and Patient connected to face mask oxygen  Post-op Assessment: Report given to PACU RN and Post -op Vital signs reviewed and stable  Post vital signs: Reviewed and stable  Complications: No apparent anesthesia complications

## 2013-04-09 NOTE — Anesthesia Preprocedure Evaluation (Addendum)
Anesthesia Evaluation  Patient identified by MRN, date of birth, ID band Patient awake    Reviewed: Allergy & Precautions, H&P , NPO status , Patient's Chart, lab work & pertinent test results  Airway Mallampati: II      Dental  (+) Teeth Intact   Pulmonary shortness of breath,  breath sounds clear to auscultation  Pulmonary exam normal       Cardiovascular hypertension, Pt. on medications Rhythm:Regular Rate:Normal     Neuro/Psych  Neuromuscular disease negative psych ROS   GI/Hepatic negative GI ROS, Neg liver ROS,   Endo/Other  negative endocrine ROS  Renal/GU negative Renal ROS   Obstructive uropathy and hypotonic bladder.  negative genitourinary   Musculoskeletal negative musculoskeletal ROS (+)   Abdominal   Peds  Hematology negative hematology ROS (+)   Anesthesia Other Findings   Reproductive/Obstetrics                          Anesthesia Physical Anesthesia Plan  ASA: II  Anesthesia Plan: Spinal   Post-op Pain Management:    Induction: Intravenous  Airway Management Planned: Simple Face Mask  Additional Equipment:   Intra-op Plan:   Post-operative Plan:   Informed Consent: I have reviewed the patients History and Physical, chart, labs and discussed the procedure including the risks, benefits and alternatives for the proposed anesthesia with the patient or authorized representative who has indicated his/her understanding and acceptance.   Dental advisory given  Plan Discussed with: CRNA  Anesthesia Plan Comments:         Anesthesia Quick Evaluation

## 2013-04-09 NOTE — Op Note (Signed)
Pre-operative diagnosis- Osteoarthritis  Left knee(s)  Post-operative diagnosis- Osteoarthritis Left knee(s)  Procedure-  Left  Total Knee Arthroplasty  Surgeon- Dione Plover. Devery Odwyer, MD  Assistant- Arlee Muslim, PA-C   Anesthesia-  Spinal  EBL-* No blood loss amount entered *   Drains Hemovac  Tourniquet time- 35 minutes @ [id number] mm HG  Complications- None  Condition-PACU - hemodynamically stable.   Brief Clinical Note   [name] [name] is a 79 y.o. year old male with end stage OA of his left knee with progressively worsening pain and dysfunction. He has constant pain, with activity and at rest and significant functional deficits with difficulties even with ADLs. He has had extensive non-op management including analgesics, injections of cortisone and viscosupplements, and home exercise program, but remains in significant pain with significant dysfunction. Radiographs show bone on bone arthritis medial and patellofemoral. He presents now for left Total Knee Arthroplasty.    Procedure in detail---   The patient is brought into the operating room and positioned supine on the operating table. After successful administration of  Spinal,   a tourniquet is placed high on the  Left thigh(s) and the lower extremity is prepped and draped in the usual sterile fashion. Time out is performed by the operating team and then the  Left lower extremity is wrapped in Esmarch, knee flexed and the tourniquet inflated to 300 mmHg.       A midline incision is made with a ten blade through the subcutaneous tissue to the level of the extensor mechanism. A fresh blade is used to make a medial parapatellar arthrotomy. Soft tissue over the proximal medial tibia is subperiosteally elevated to the joint line with a knife and into the semimembranosus bursa with a Cobb elevator. Soft tissue over the proximal lateral tibia is elevated with attention being paid to avoiding the patellar tendon on the tibial tubercle. The patella is  everted, knee flexed 90 degrees and the ACL and PCL are removed. Findings are bone on bone medial and patellofemoral with large medial osteophytes.        The drill is used to create a starting hole in the distal femur and the canal is thoroughly irrigated with sterile saline to remove the fatty contents. The 5 degree Left  valgus alignment guide is placed into the femoral canal and the distal femoral cutting block is pinned to remove 10 mm off the distal femur. Resection is made with an oscillating saw.      The tibia is subluxed forward and the menisci are removed. The extramedullary alignment guide is placed referencing proximally at the medial aspect of the tibial tubercle and distally along the second metatarsal axis and tibial crest. The block is pinned to remove 24mm off the more deficient medial  side. Resection is made with an oscillating saw. Size 4is the most appropriate size for the tibia and the proximal tibia is prepared with the modular drill and keel punch for that size.      The femoral sizing guide is placed and size 5 is most appropriate. Rotation is marked off the epicondylar axis and confirmed by creating a rectangular flexion gap at 90 degrees. The size 5 cutting block is pinned in this rotation and the anterior, posterior and chamfer cuts are made with the oscillating saw. The intercondylar block is then placed and that cut is made.      Trial size 4 tibial component, trial size 5 posterior stabilized femur and a 15  mm posterior stabilized  rotating platform insert trial is placed. Full extension is achieved with excellent varus/valgus and anterior/posterior balance throughout full range of motion. The patella is everted and thickness measured to be 24  mm. Free hand resection is taken to 14 mm, a 38 template is placed, lug holes are drilled, trial patella is placed, and it tracks normally. Osteophytes are removed off the posterior femur with the trial in place. All trials are removed and  the cut bone surfaces prepared with pulsatile lavage. Cement is mixed and once ready for implantation, the size 4 tibial implant, size  5 posterior stabilized femoral component, and the size 38 patella are cemented in place and the patella is held with the clamp. The trial insert is placed and the knee held in full extension. The Exparel (20 ml mixed with 30 ml saline) and .25% Bupivicaine, are injected into the extensor mechanism, posterior capsule, medial and lateral gutters and subcutaneous tissues.  All extruded cement is removed and once the cement is hard the permanent 15 mm posterior stabilized rotating platform insert is placed into the tibial tray.      The wound is copiously irrigated with saline solution and the extensor mechanism closed over a hemovac drain with #1 PDS suture. The tourniquet is released for a total tourniquet time of 35  minutes. Flexion against gravity is 140 degrees and the patella tracks normally. Subcutaneous tissue is closed with 2.0 vicryl and subcuticular with running 4.0 Monocryl. The incision is cleaned and dried and steri-strips and a bulky sterile dressing are applied. The limb is placed into a knee immobilizer and the patient is awakened and transported to recovery in stable condition.      Please note that a surgical assistant was a medical necessity for this procedure in order to perform it in a safe and expeditious manner. Surgical assistant was necessary to retract the ligaments and vital neurovascular structures to prevent injury to them and also necessary for proper positioning of the limb to allow for anatomic placement of the prosthesis.   Dione Plover Jatara Huettner, MD    04/09/2013, 3:06 PM

## 2013-04-09 NOTE — Anesthesia Postprocedure Evaluation (Signed)
Anesthesia Post Note  Patient: [name] [name]  Procedure(s) Performed: Procedure(s) (LRB): LEFT TOTAL KNEE ARTHROPLASTY (Left)  Anesthesia type: Spinal  Patient location: PACU  Post pain: Pain level controlled  Post assessment: Post-op Vital signs reviewed  Last Vitals:  Filed Vitals:   04/09/13 1735  BP: 143/87  Pulse: 66  Temp: 36.9 C  Resp: 16    Post vital signs: Reviewed  Level of consciousness: sedated  Complications: No apparent anesthesia complications

## 2013-04-09 NOTE — H&P (View-Only) (Signed)
[name] [name]  DOB: [birth year] Married / Language: English / Race: White Male  Date of Admission:  04/09/2013  Chief Complaint:  Left Knee Pain  History of Present Illness The patient is a 79 year old male who comes in for a preoperative History and Physical. The patient is scheduled for a left total knee arthroplasty to be performed by Dr. Dione Plover. Aluisio, MD at [facility] on 04/09/2013. The patient is a 79 year old male who presents for follow up of their knee. The patient is being followed for their left knee pain and osteoarthritis. Symptoms reported today include: pain. The patient feels that they are doing poorly and report their pain level to be mild to moderate (varies in severity). The following medication has been used for pain control: none. The patient presents weeks post cortisone injection. It unfortuantely did not help. Unfortunately the knee is getting progressively worse for him. He is having pain at all times limiting what he can and can not do. He does not get swelling. It is not giving out on him. He is now ready to get the knee fixed. They have been treated conservatively in the past for the above stated problem and despite conservative measures, they continue to have progressive pain and severe functional limitations and dysfunction. They have failed non-operative management including home exercise, medications, and injections. It is felt that they would benefit from undergoing total joint replacement. Risks and benefits of the procedure have been discussed with the patient and they elect to proceed with surgery. There are no active contraindications to surgery such as ongoing infection or rapidly progressive neurological disease.   Problem List Primary osteoarthritis of one knee (715.16) S/P total knee replacement (V43.65). right   Allergies Statins - he is not sure which "statin" he is allergic to, muscle pain    Family History Diabetes  Mellitus. mother Congestive Heart Failure. mother Heart Disease. mother, father and brother Heart disease in male family member before age 1 Heart disease in male family member before age 52 Cerebrovascular Accident. father Cancer. sister and brother Osteoarthritis. mother, father and sister Hypertension. mother, father and brother Osteoporosis. father Rheumatoid Arthritis. father and sister    Social History Drug/Alcohol Rehab (Currently). no Current work status. working part time Drug/Alcohol Rehab (Previously). no Illicit drug use. no Exercise. Exercises weekly; does running / walking and gym / weights Children. 2 Alcohol use. current drinker; drinks beer and wine; only occasionally per week Tobacco use. never smoker Marital status. married Living situation. live with spouse Number of flights of stairs before winded. greater than 5 Tobacco / smoke exposure. no Pain Contract. no Post-Surgical Plans. Home with wife Advance Directives. Living Will, Healthcare POA    Medication History SulfaSALAzine (500MG  Tablet, Oral) Active. Tamsulosin HCl (0.4MG  Capsule, Oral) Active. Avodart (0.5MG  Capsule, Oral) Active. Folic Acid (1MG  Tablet, Oral) Active. Aspirin (81MG  Tablet, Oral) Active. Benicar (20MG  Tablet, Oral) Active. Zetia (10MG  Tablet, Oral) Active.    Past Surgical History Colon Polyp Removal - Colonoscopy Cataract Surgery. bilateral Total Knee Replacement. Date: 10/2009. right    Medical History Skin Cancer Prostate Disease. Cancer Sleep Apnea Osteoarthritis Hypercholesterolemia Prostate Cancer Peripheral Neuropathy High blood pressure Bronchitis Cataract Hemorrhoids Measles Scarlet Fever Ulcerative Colitis    Review of Systems General:Not Present- Chills, Fever, Night Sweats, Fatigue, Weight Gain, Weight Loss and Memory Loss. Skin:Not Present- Hives, Itching, Rash, Eczema and Lesions. HEENT:Not  Present- Tinnitus, Headache, Double Vision, Visual Loss, Hearing Loss and Dentures. Respiratory:Present- Shortness of breath  at rest. Not Present- Shortness of breath with exertion, Allergies, Coughing up blood and Chronic Cough. Cardiovascular:Not Present- Chest Pain, Racing/skipping heartbeats, Difficulty Breathing Lying Down, Murmur, Swelling and Palpitations. Gastrointestinal:Not Present- Bloody Stool, Heartburn, Abdominal Pain, Vomiting, Nausea, Constipation, Diarrhea, Difficulty Swallowing, Jaundice and Loss of appetitie. Male Genitourinary:Present- Urinary Retention. Not Present- Urinary frequency, Blood in Urine, Weak urinary stream, Discharge, Flank Pain, Incontinence, Painful Urination, Urgency and Urinating at Night. Musculoskeletal:Present- Joint Swelling and Joint Pain. Not Present- Muscle Weakness, Muscle Pain, Back Pain, Morning Stiffness and Spasms. Neurological:Not Present- Tremor, Dizziness, Blackout spells, Paralysis, Difficulty with balance and Weakness. Psychiatric:Not Present- Insomnia.    Vitals Pulse: 76 (Regular) Resp.: 14 (Unlabored) BP: 104/58 (Sitting, Right Arm, Standard)    Physical Exam The physical exam findings are as follows:   General Mental Status - Alert, cooperative and good historian. General Appearance- pleasant. Not in acute distress. Orientation- Oriented X3. Build & Nutrition- Well nourished and Well developed.   Head and Neck Head- normocephalic, atraumatic . Neck Global Assessment- supple. no bruit auscultated on the right and no bruit auscultated on the left.   Eye Vision- Wears corrective lenses. Pupil- Bilateral- Regular and Round. Motion- Bilateral- EOMI.   Chest and Lung Exam Auscultation: Breath sounds:- clear at anterior chest wall and - clear at posterior chest wall. Adventitious sounds:- No Adventitious sounds.   Cardiovascular Auscultation:Rhythm- Regular rate and rhythm. Heart  Sounds- S1 WNL and S2 WNL. Murmurs & Other Heart Sounds:Auscultation of the heart reveals - No Murmurs.   Abdomen Palpation/Percussion:Tenderness- Abdomen is non-tender to palpation. Rigidity (guarding)- Abdomen is soft. Auscultation:Auscultation of the abdomen reveals - Bowel sounds normal.   Male Genitourinary Not done, not pertinent to present illness  Musculoskeletal  Well developed male in no distress. His left knee shows no effusion. He has range about 5 to 125 with marked crepitus on range of motion. Tender medial greater than lateral with no instability.  RADIOGRAPHS: He is bone on bone in the medial and patellofemoral compartments.   Assessment & Plan Primary osteoarthritis of one knee (715.16) Impression: Left Knee  Note: Plan is for a Left Total Knee Replacement by Dr. Wynelle Link.  Plan is to go home with wife.  PCP - Dr. [name]  The patient will not receive TXA (tranexamic acid) due to: Prostate Cancer  Signed electronically by Allante Whitmire Monika Salk, III PA-C

## 2013-04-09 NOTE — Anesthesia Procedure Notes (Signed)
Spinal  Patient location during procedure: OR Start time: 04/09/2013 1:52 PM End time: 04/09/2013 1:56 PM Staffing Anesthesiologist: Freddie Apley F Performed by: anesthesiologist  Preanesthetic Checklist Completed: patient identified, site marked, surgical consent, pre-op evaluation, timeout performed, IV checked, risks and benefits discussed and monitors and equipment checked Spinal Block Patient position: sitting Prep: Betadine Patient monitoring: heart rate, continuous pulse ox and blood pressure Injection technique: single-shot Needle Needle type: Sprotte  Needle gauge: 24 G Needle length: 9 cm Additional Notes Expiration date of kit checked and confirmed. Patient tolerated procedure well, without complications. Negative heme/paresthesia Lot [id number] DOE 05/2014

## 2013-04-09 NOTE — Progress Notes (Signed)
Utilization review completed.  

## 2013-04-10 ENCOUNTER — Encounter (HOSPITAL_COMMUNITY): Payer: Self-pay | Admitting: Orthopedic Surgery

## 2013-04-10 LAB — BASIC METABOLIC PANEL
BUN: 15 mg/dL (ref 6–23)
CO2: 25 mEq/L (ref 19–32)
Calcium: 8.9 mg/dL (ref 8.4–10.5)
Chloride: 105 mEq/L (ref 96–112)
Creatinine, Ser: 1.16 mg/dL (ref 0.50–1.35)
GFR calc Af Amer: 65 mL/min — ABNORMAL LOW (ref >90)
GFR calc non Af Amer: 56 mL/min — ABNORMAL LOW (ref >90)
Glucose, Bld: 117 mg/dL — ABNORMAL HIGH (ref 70–99)
Potassium: 4.1 mEq/L (ref 3.5–5.1)
Sodium: 138 mEq/L (ref 135–145)

## 2013-04-10 LAB — CBC
HCT: 32.4 % — ABNORMAL LOW (ref 39.0–52.0)
Hemoglobin: 11.3 g/dL — ABNORMAL LOW (ref 13.0–17.0)
MCH: 31.9 pg (ref 26.0–34.0)
MCHC: 34.9 g/dL (ref 30.0–36.0)
MCV: 91.5 fL (ref 78.0–100.0)
Platelets: 141 10*3/uL — ABNORMAL LOW (ref 150–400)
RBC: 3.54 MIL/uL — ABNORMAL LOW (ref 4.22–5.81)
RDW: 13.6 % (ref 11.5–15.5)
WBC: 14.3 10*3/uL — ABNORMAL HIGH (ref 4.0–10.5)

## 2013-04-10 NOTE — Evaluation (Signed)
Physical Therapy Evaluation Patient Details Name: [name] [name] MRN: [id number] DOB: May 29, 1928 Today's Date: 04/10/2013 Time: [id number] PT Time Calculation (min): 14 min  PT Assessment / Plan / Recommendation History of Present Illness     Clinical Impression  Pt is s/p L TKA resulting in the deficits listed below (see PT Problem List). Pt will benefit from skilled PT to increase their independence and safety with mobility to allow discharge home with spouse.  Pt reports working out with trainer at [employer] prior to surgery so will likely progress very well.     PT Assessment  Patient needs continued PT services    Follow Up Recommendations  Home health PT    Does the patient have the potential to tolerate intense rehabilitation      Barriers to Discharge        Equipment Recommendations  None recommended by PT    Recommendations for Other Services     Frequency 7X/week    Precautions / Restrictions Precautions Precautions: Knee Required Braces or Orthoses: Knee Immobilizer - Left Knee Immobilizer - Left: Discontinue once straight leg raise with < 10 degree lag Restrictions Other Position/Activity Restrictions: WBAT L LE   Pertinent Vitals/Pain Pt reports very minimal pain (not rated) during ambulation and states none with standing at rest.  Ice packs applied to knee end of session.      Mobility  Bed Mobility Bed Mobility: Not assessed Details for Bed Mobility Assistance: pt up in recliner on arrival Transfers Transfers: Sit to Stand;Stand to Sit Sit to Stand: 4: Min assist;With upper extremity assist;From chair/3-in-1 Stand to Sit: 4: Min guard;With upper extremity assist;To chair/3-in-1 Details for Transfer Assistance: verbal cues for safe technique including hand placement and L LE forward Ambulation/Gait Ambulation/Gait Assistance: 4: Min assist Ambulation Distance (Feet): 120 Feet Assistive device: Rolling walker Ambulation/Gait Assistance Details: verbal  cues for posture, RW distance, step length, sequence Gait Pattern: Step-to pattern;Antalgic    Exercises     PT Diagnosis: Difficulty walking  PT Problem List: Decreased strength;Decreased range of motion;Decreased mobility;Decreased knowledge of use of DME;Decreased knowledge of precautions;Pain PT Treatment Interventions: DME instruction;Gait training;Functional mobility training;Stair training;Therapeutic activities;Therapeutic exercise;Patient/family education     PT Goals(Current goals can be found in the care plan section) Acute Rehab PT Goals PT Goal Formulation: With patient Time For Goal Achievement: 04/17/13 Potential to Achieve Goals: Good  Visit Information  Last PT Received On: 04/10/13 Assistance Needed: +1       Prior [place] expects to be discharged to:: Private residence Living Arrangements: Spouse/significant other Type of Home: House Home Access: Stairs to enter Technical brewer of Steps: 2 Entrance Stairs-Rails: None Home Layout: Able to live on main level with bedroom/bathroom;Two level Home Equipment: Walker - 2 wheels;Cane - single point Prior Function Level of Independence: Independent Communication Communication: No difficulties    Cognition  Cognition Arousal/Alertness: Awake/alert Behavior During Therapy: WFL for tasks assessed/performed Overall Cognitive Status: Within Functional Limits for tasks assessed    Extremity/Trunk Assessment Lower Extremity Assessment Lower Extremity Assessment: LLE deficits/detail LLE Deficits / Details: good quad contraction, ROM TBD, maintained KI   Balance    End of Session PT - End of Session Equipment Utilized During Treatment: Left knee immobilizer Activity Tolerance: Patient tolerated treatment well Patient left: in chair;with call bell/phone within reach CPM Left Knee CPM Left Knee: Off  GP     Sherral Dirocco,KATHrine E 04/10/2013, 10:46 AM Carmelia Bake, PT,  DPT 04/10/2013 Pager: [phone]

## 2013-04-10 NOTE — Progress Notes (Signed)
   Subjective: 1 Day Post-Op Procedure(s) (LRB): LEFT TOTAL KNEE ARTHROPLASTY (Left) Patient reports pain as mild and moderate.   Patient seen in rounds with Dr. Wynelle Link. Patient is having problems with pain in the knee, requiring pain medications We will start therapy today.  Plan is to go Home after hospital stay.  Objective: Vital signs in last 24 hours: Temp:  [96.7 F (35.9 C)-98.8 F (37.1 C)] 98.7 F (37.1 C) (10/14 0600) Pulse Rate:  [62-96] 83 (10/14 0600) Resp:  [14-20] 16 (10/14 0600) BP: (105-143)/(73-87) 105/73 mmHg (10/14 0600) SpO2:  [94 %-100 %] 95 % (10/14 0600) Weight:  [84.482 kg (186 lb 4 oz)] 84.482 kg (186 lb 4 oz) (10/13 1636)  Intake/Output from previous day:  Intake/Output Summary (Last 24 hours) at 04/10/13 0813 Last data filed at 04/10/13 0600  Gross per 24 hour  Intake   2785 ml  Output   3840 ml  Net  -1055 ml    Intake/Output this shift:    Labs:  Recent Labs  04/10/13 0444  HGB 11.3*    Recent Labs  04/10/13 0444  WBC 14.3*  RBC 3.54*  HCT 32.4*  PLT 141*    Recent Labs  04/10/13 0444  NA 138  K 4.1  CL 105  CO2 25  BUN 15  CREATININE 1.16  GLUCOSE 117*  CALCIUM 8.9   No results found for this basename: LABPT, INR,  in the last 72 hours  EXAM General - Patient is Alert, Appropriate and Oriented Extremity - Neurovascular intact Sensation intact distally Dorsiflexion/Plantar flexion intact Dressing - dressing C/D/I Motor Function - intact, moving foot and toes well on exam.  Hemovac pulled without difficulty.  Past Medical History  Diagnosis Date  . Peripheral neuropathy     Elevated by Dr. love 2010  . Essential hypertension   . Hyperlipidemia   . Essential tremor   . Obstructive uropathy   . Hypotonic bladder     L4-L5  . Prostate cancer 2005  . Edema     Assessment/Plan: 1 Day Post-Op Procedure(s) (LRB): LEFT TOTAL KNEE ARTHROPLASTY (Left) Principal Problem:   OA (osteoarthritis) of  knee  Estimated body mass index is 27.49 kg/(m^2) as calculated from the following:   Height as of this encounter: 5\' 9"  (1.753 m).   Weight as of this encounter: 84.482 kg (186 lb 4 oz). Advance diet Up with therapy Plan for discharge tomorrow Discharge home with home health  DVT Prophylaxis - Xarelto Weight-Bearing as tolerated to left leg D/C O2 and Pulse OX and try on Room Air  PERKINS, Augusto Garbe 04/10/2013, 8:13 AM

## 2013-04-10 NOTE — Progress Notes (Signed)
PT Treatment Note   04/10/13 1500  PT Visit Information  Last PT Received On 04/10/13  Assistance Needed +1  PT Time Calculation  PT Start Time 1321  PT Stop Time 1349  PT Time Calculation (min) 28 min  Subjective Data  Subjective Pt agreeable to ambulate and perform exercises. Pt expecting spouse visit this afternoon and plans to d/c home tomorrow.  Precautions  Precautions Knee  Required Braces or Orthoses Knee Immobilizer - Left  Knee Immobilizer - Left Discontinue once straight leg raise with < 10 degree lag  Restrictions  Other Position/Activity Restrictions WBAT L LE  Cognition  Arousal/Alertness Awake/alert  Behavior During Therapy WFL for tasks assessed/performed  Overall Cognitive Status Within Functional Limits for tasks assessed  Bed Mobility  Bed Mobility Supine to Sit;Sit to Supine  Supine to Sit 4: Min assist  Sit to Supine 5: Supervision  Details for Bed Mobility Assistance assist for L LE off bed  Transfers  Transfers Sit to Stand;Stand to Sit  Sit to Stand 4: Min guard;With upper extremity assist;From bed  Stand to Sit 4: Min guard;With upper extremity assist;To chair/3-in-1  Details for Transfer Assistance verbal cues for safe technique including hand placement and L LE forward  Ambulation/Gait  Ambulation/Gait Assistance 4: Min guard  Ambulation Distance (Feet) 120 Feet  Assistive device Rolling walker  Ambulation/Gait Assistance Details verbal cues for RW distance, step length, sequence  Gait Pattern Step-to pattern;Antalgic  General Gait Details with with slower pace this afternoon, states he is having a little more pain however wished to continue with ambulation, encouraged WBing through RW   Exercises  Exercises Total Joint  Total Joint Exercises  Ankle Circles/Pumps AROM;Both;20 reps;Supine  Quad Sets AROM;Both;20 reps;Supine  Short Arc Quad AAROM;Left;15 reps;Supine  Heel Slides AAROM;Left;15 reps;Supine  Hip ABduction/ADduction AROM;15  reps;Left;Supine  Goniometric ROM AROM L knee supine -10-60*  PT - End of Session  Equipment Utilized During Treatment Left knee immobilizer  Activity Tolerance Patient tolerated treatment well  Patient left in bed;with call bell/phone within reach  PT - Assessment/Plan  PT Plan Current plan remains appropriate  Follow Up Recommendations Home health PT  PT equipment None recommended by PT  PT Goal Progression  Progress towards PT goals Progressing toward goals  PT General Charges  $$ ACUTE PT VISIT 1 Procedure  PT Treatments  $Gait Training 8-22 mins  $Therapeutic Exercise 8-22 mins   Carmelia Bake, PT, DPT 04/10/2013 Pager: [phone]

## 2013-04-10 NOTE — Progress Notes (Signed)
OT Cancellation Note  Patient Details Name: [name] [name] MRN: [id number] DOB: 09/09/1927   Cancelled Treatment:    Reason Eval/Treat Not Completed: OT screened, no needs identified, will sign off.  Pt had other knee done 3 1/2 years ago and still has DME.  He will sponge bathe initially as he has a tub/shower.  Brigham Cobbins 04/10/2013, 9:46 AM Lesle Chris, OTR/L [phone] 04/10/2013

## 2013-04-10 NOTE — Care Management Note (Addendum)
    Page 1 of 1   04/11/2013     3:35:42 PM   CARE MANAGEMENT NOTE 04/11/2013  Patient:  [name] [name], [name] [name]   Account Number:  [id number]  Date Initiated:  04/10/2013  Documentation initiated by:  Sherrin Daisy  Subjective/Objective Assessment:   dx total left knee replacemnt     Action/Plan:   CM spoke with patient and spouse. Plans are to return to his home where spouse will be caregiver. He already has DME. Plans to use [facility] for [facility] services.   Anticipated DC Date:  04/12/2013   Anticipated DC Plan:  [place]  CM consult      [facility] Choice  HOME HEALTH   Choice offered to / List presented to:  C-1 Patient        [place] arranged  HH-2 PT      [place]   Status of service:  Completed, signed off Medicare Important Message given?  NA - LOS <3 / Initial given by admissions (If response is "NO", the following Medicare IM given date fields will be blank) Date Medicare IM given:   Date Additional Medicare IM given:    Discharge Disposition:    Per UR Regulation:    If discussed at Long Length of Stay Meetings, dates discussed:    Comments:  04/11/2013 Sherrin Daisy BSN RN CCM [phone] [place] [facility] will provide [facility] services with start of day after patient is discharged.

## 2013-04-11 LAB — BASIC METABOLIC PANEL
BUN: 16 mg/dL (ref 6–23)
CO2: 26 mEq/L (ref 19–32)
Calcium: 8.9 mg/dL (ref 8.4–10.5)
Chloride: 103 mEq/L (ref 96–112)
Creatinine, Ser: 1.13 mg/dL (ref 0.50–1.35)
GFR calc Af Amer: 67 mL/min — ABNORMAL LOW (ref >90)
GFR calc non Af Amer: 58 mL/min — ABNORMAL LOW (ref >90)
Glucose, Bld: 116 mg/dL — ABNORMAL HIGH (ref 70–99)
Potassium: 4.2 mEq/L (ref 3.5–5.1)
Sodium: 138 mEq/L (ref 135–145)

## 2013-04-11 LAB — CBC
HCT: 30.4 % — ABNORMAL LOW (ref 39.0–52.0)
Hemoglobin: 10.2 g/dL — ABNORMAL LOW (ref 13.0–17.0)
MCH: 31.1 pg (ref 26.0–34.0)
MCHC: 33.6 g/dL (ref 30.0–36.0)
MCV: 92.7 fL (ref 78.0–100.0)
Platelets: 128 10*3/uL — ABNORMAL LOW (ref 150–400)
RBC: 3.28 MIL/uL — ABNORMAL LOW (ref 4.22–5.81)
RDW: 14 % (ref 11.5–15.5)
WBC: 12.2 10*3/uL — ABNORMAL HIGH (ref 4.0–10.5)

## 2013-04-11 MED ORDER — RIVAROXABAN 10 MG PO TABS: 10.0000 mg | ORAL_TABLET | Freq: Every day | ORAL | Status: DC

## 2013-04-11 MED ORDER — METHOCARBAMOL 500 MG PO TABS: 500.0000 mg | ORAL_TABLET | Freq: Four times a day (QID) | ORAL | Status: DC | PRN

## 2013-04-11 MED ORDER — OXYCODONE HCL 5 MG PO TABS: 5.0000 mg | ORAL_TABLET | ORAL | Status: DC | PRN

## 2013-04-11 NOTE — Progress Notes (Signed)
I have reviewed this note and agree with all findings. Kati Amerigo Mcglory, PT, DPT Pager: [phone]   

## 2013-04-11 NOTE — Progress Notes (Signed)
Physical Therapy Treatment Patient Details Name: [name] [name] MRN: [id number] DOB: Sep 26, 1927 Today's Date: 04/11/2013 Time: [id number] PT Time Calculation (min): 40 min  PT Assessment / Plan / Recommendation  History of Present Illness Pt s/p L TKA on 04/09/13 due to OA.   PT Comments   Pt able to ambulate with RW and min guard-supervision and perform LE strengthening activities today, but limited by pain and "tightness" in LLE. Pt able to perform straight leg raise, so able to discontinue L knee immobilizer but pt encouraged to wear immobilizer to when going home.  PT educated and provided pt with stair and LE strengthening exercises handouts. Pt would continue to benefit from skilled PT in order to improve functional mobility and safety.  Follow Up Recommendations  Home health PT;Supervision for mobility/OOB     Does the patient have the potential to tolerate intense rehabilitation     Barriers to Discharge        Equipment Recommendations  None recommended by PT    Recommendations for Other Services    Frequency 7X/week   Progress towards PT Goals Progress towards PT goals: Progressing toward goals  Plan Current plan remains appropriate    Precautions / Restrictions Precautions Precautions: Knee;Fall Required Braces or Orthoses: Knee Immobilizer - Left Knee Immobilizer - Left: Discontinue once straight leg raise with < 10 degree lag Restrictions Weight Bearing Restrictions: No Other Position/Activity Restrictions: WBAT L LE   Pertinent Vitals/Pain No c/o pain at rest with increase in LLE pain during ambulation (8/10). RN notified and pt received pain meds after ambulation and positioned to comfort at end of session.    Mobility  Bed Mobility Bed Mobility: Sit to Supine Sit to Supine: 4: Min assist Details for Bed Mobility Assistance: Min A during sit to supine to assist LLE onto bed due to pt reporting "tightness" in LLE today. pt using bathroom upon arrival (in  standing). Transfers Transfers: Stand to Sit Stand to Sit: 4: Min guard Details for Transfer Assistance: Min guard to ensure safety and VC's to keep LLE extended during stand to sit. Ambulation/Gait Ambulation/Gait Assistance: 4: Min guard Ambulation Distance (Feet): 125 Feet Assistive device: Rolling walker Ambulation/Gait Assistance Details: Min guard to supervision during ambulation to ensure safety as pt reports 8/10 LLE pain during ambulation. RN notified and pt received pain meds upon returning to supine after amb. Gait Pattern: Step-to pattern;Antalgic Gait velocity: Decreased Stairs: Yes Stairs Assistance: 4: Min guard Stairs Assistance Details (indicate cue type and reason): pt able to ascend/descend 3 steps,  ascending backwards and descending forwards with RW and min guard to ensure safety. VC's for proper gait sequence. Stair Management Technique: No rails;With walker Number of Stairs: 3    Exercises Total Joint Exercises Ankle Circles/Pumps: AROM;Both;20 reps;Supine Quad Sets: AROM;20 reps;Supine;Left Towel Squeeze: AROM;20 reps;Both;Supine Short Arc Quad: Left;Supine;20 reps;AAROM;AROM (AAROM due to fatigue and "tightness") Heel Slides: AAROM;Left;Supine;10 reps (utilized sheet for AAROM) Hip ABduction/ADduction: AROM;Left;Supine;10 reps Straight Leg Raises: AROM;Left;10 reps;Supine   PT Diagnosis: Difficulty walking  PT Problem List: Decreased strength;Decreased range of motion;Decreased mobility;Decreased knowledge of use of DME;Decreased knowledge of precautions;Pain PT Treatment Interventions:     PT Goals (current goals can now be found in the care plan section)    Visit Information  Last PT Received On: 04/11/13 Assistance Needed: +1 History of Present Illness: Pt s/p L TKA on 04/09/13 due to OA.    Subjective Data      Cognition  Cognition Arousal/Alertness: Awake/alert Behavior During Therapy: [facility]  for tasks assessed/performed Overall Cognitive  Status: Within Functional Limits for tasks assessed    Balance     End of Session PT - End of Session Activity Tolerance: Patient limited by fatigue;Patient limited by pain Patient left: in bed;with call bell/phone within reach   GP     Audie Clear 04/11/2013, 10:27 AM

## 2013-04-11 NOTE — Progress Notes (Signed)
   Subjective: 2 Days Post-Op Procedure(s) (LRB): LEFT TOTAL KNEE ARTHROPLASTY (Left) Patient reports pain as mild.   Patient seen in rounds with Dr. Wynelle Link. Patient is well, and has had no acute complaints or problems Patient is ready to go home  Objective: Vital signs in last 24 hours: Temp:  [97.4 F (36.3 C)-99.5 F (37.5 C)] 99.5 F (37.5 C) (10/15 1520) Pulse Rate:  [78-94] 94 (10/15 1520) Resp:  [16-18] 16 (10/15 1520) BP: (98-122)/(61-73) 99/61 mmHg (10/15 1520) SpO2:  [91 %-96 %] 91 % (10/15 1520)  Intake/Output from previous day:  Intake/Output Summary (Last 24 hours) at 04/11/13 1631 Last data filed at 04/11/13 1315  Gross per 24 hour  Intake    840 ml  Output    900 ml  Net    -60 ml    Intake/Output this shift: Total I/O In: 600 [P.O.:600] Out: -   Labs:  Recent Labs  04/10/13 0444 04/11/13 0410  HGB 11.3* 10.2*    Recent Labs  04/10/13 0444 04/11/13 0410  WBC 14.3* 12.2*  RBC 3.54* 3.28*  HCT 32.4* 30.4*  PLT 141* 128*    Recent Labs  04/10/13 0444 04/11/13 0410  NA 138 138  K 4.1 4.2  CL 105 103  CO2 25 26  BUN 15 16  CREATININE 1.16 1.13  GLUCOSE 117* 116*  CALCIUM 8.9 8.9   No results found for this basename: LABPT, INR,  in the last 72 hours  EXAM: General - Patient is Alert and Appropriate Extremity - Neurovascular intact Sensation intact distally Incision - dry, no drainage Motor Function - intact, moving foot and toes well on exam.   Assessment/Plan: 2 Days Post-Op Procedure(s) (LRB): LEFT TOTAL KNEE ARTHROPLASTY (Left) Procedure(s) (LRB): LEFT TOTAL KNEE ARTHROPLASTY (Left) Past Medical History  Diagnosis Date  . Peripheral neuropathy     Elevated by Dr. love 2010  . Essential hypertension   . Hyperlipidemia   . Essential tremor   . Obstructive uropathy   . Hypotonic bladder     L4-L5  . Prostate cancer 2005  . Edema    Principal Problem:   OA (osteoarthritis) of knee  Estimated body mass index is  27.49 kg/(m^2) as calculated from the following:   Height as of this encounter: 5\' 9"  (1.753 m).   Weight as of this encounter: 84.482 kg (186 lb 4 oz). Up with therapy Discharge home with home health Diet - Cardiac diet Follow up - in 2 weeks Activity - WBAT Disposition - Home Condition Upon Discharge - Good D/C Meds - See DC Summary DVT Prophylaxis - Xarelto  Benson Porcaro 04/11/2013, 4:31 PM

## 2013-04-11 NOTE — Discharge Summary (Signed)
Physician Discharge Summary   Patient ID: [name] [name] MRN: [id number] DOB/AGE: 77-Dec-1929 77 y.o.  Admit date: 04/09/2013 Discharge date: 04/11/2013  Primary Diagnosis:  Osteoarthritis Left knee(s)  Admission Diagnoses:  Past Medical History  Diagnosis Date  . Peripheral neuropathy     Elevated by Dr. love 2010  . Essential hypertension   . Hyperlipidemia   . Essential tremor   . Obstructive uropathy   . Hypotonic bladder     L4-L5  . Prostate cancer 2005  . Edema    Discharge Diagnoses:   Principal Problem:   OA (osteoarthritis) of knee  Estimated body mass index is 27.49 kg/(m^2) as calculated from the following:   Height as of this encounter: 5\' 9"  (1.753 m).   Weight as of this encounter: 84.482 kg (186 lb 4 oz).  Procedure:  Procedure(s) (LRB): LEFT TOTAL KNEE ARTHROPLASTY (Left)   Consults: None  HPI: [name] [name] is a 79 y.o. year old male with end stage OA of his left knee with progressively worsening pain and dysfunction. He has constant pain, with activity and at rest and significant functional deficits with difficulties even with ADLs. He has had extensive non-op management including analgesics, injections of cortisone and viscosupplements, and home exercise program, but remains in significant pain with significant dysfunction. Radiographs show bone on bone arthritis medial and patellofemoral. He presents now for left Total Knee Arthroplasty.   Laboratory Data: Admission on 04/09/2013, Discharged on 04/11/2013  Component Date Value Range Status  . WBC 04/10/2013 14.3* 4.0 - 10.5 K/uL Final  . RBC 04/10/2013 3.54* 4.22 - 5.81 MIL/uL Final  . Hemoglobin 04/10/2013 11.3* 13.0 - 17.0 g/dL Final  . HCT 04/10/2013 32.4* 39.0 - 52.0 % Final  . MCV 04/10/2013 91.5  78.0 - 100.0 fL Final  . MCH 04/10/2013 31.9  26.0 - 34.0 pg Final  . MCHC 04/10/2013 34.9  30.0 - 36.0 g/dL Final  . RDW 04/10/2013 13.6  11.5 - 15.5 % Final  . Platelets 04/10/2013 141* 150 - 400  K/uL Final  . Sodium 04/10/2013 138  135 - 145 mEq/L Final  . Potassium 04/10/2013 4.1  3.5 - 5.1 mEq/L Final  . Chloride 04/10/2013 105  96 - 112 mEq/L Final  . CO2 04/10/2013 25  19 - 32 mEq/L Final  . Glucose, Bld 04/10/2013 117* 70 - 99 mg/dL Final  . BUN 04/10/2013 15  6 - 23 mg/dL Final  . Creatinine, Ser 04/10/2013 1.16  0.50 - 1.35 mg/dL Final  . Calcium 04/10/2013 8.9  8.4 - 10.5 mg/dL Final  . GFR calc non Af Amer 04/10/2013 56* >90 mL/min Final  . GFR calc Af Amer 04/10/2013 65* >90 mL/min Final   Comment: (NOTE)                          The eGFR has been calculated using the CKD EPI equation.                          This calculation has not been validated in all clinical situations.                          eGFR's persistently <90 mL/min signify possible Chronic Kidney                          Disease.  . WBC 04/11/2013 12.2*  4.0 - 10.5 K/uL Final  . RBC 04/11/2013 3.28* 4.22 - 5.81 MIL/uL Final  . Hemoglobin 04/11/2013 10.2* 13.0 - 17.0 g/dL Final  . HCT 04/11/2013 30.4* 39.0 - 52.0 % Final  . MCV 04/11/2013 92.7  78.0 - 100.0 fL Final  . MCH 04/11/2013 31.1  26.0 - 34.0 pg Final  . MCHC 04/11/2013 33.6  30.0 - 36.0 g/dL Final  . RDW 04/11/2013 14.0  11.5 - 15.5 % Final  . Platelets 04/11/2013 128* 150 - 400 K/uL Final  . Sodium 04/11/2013 138  135 - 145 mEq/L Final  . Potassium 04/11/2013 4.2  3.5 - 5.1 mEq/L Final  . Chloride 04/11/2013 103  96 - 112 mEq/L Final  . CO2 04/11/2013 26  19 - 32 mEq/L Final  . Glucose, Bld 04/11/2013 116* 70 - 99 mg/dL Final  . BUN 04/11/2013 16  6 - 23 mg/dL Final  . Creatinine, Ser 04/11/2013 1.13  0.50 - 1.35 mg/dL Final  . Calcium 04/11/2013 8.9  8.4 - 10.5 mg/dL Final  . GFR calc non Af Amer 04/11/2013 58* >90 mL/min Final  . GFR calc Af Amer 04/11/2013 67* >90 mL/min Final   Comment: (NOTE)                          The eGFR has been calculated using the CKD EPI equation.                          This calculation has not been  validated in all clinical situations.                          eGFR's persistently <90 mL/min signify possible Chronic Kidney                          Disease.  Hospital Outpatient Visit on 04/04/2013  Component Date Value Range Status  . MRSA, PCR 04/04/2013 NEGATIVE  NEGATIVE Final  . Staphylococcus aureus 04/04/2013 NEGATIVE  NEGATIVE Final   Comment:                                 The Xpert SA Assay (FDA                          approved for NASAL specimens                          in patients over 37 years of age),                          is one component of                          a comprehensive surveillance                          program.  Test performance has                          been validated by [facility] for patients greater                          than or equal to 59 year old.                          It is not intended                          to diagnose infection nor to                          guide or monitor treatment.  Marland Kitchen aPTT 04/04/2013 33  24 - 37 seconds Final  . WBC 04/04/2013 6.4  4.0 - 10.5 K/uL Final  . RBC 04/04/2013 4.35  4.22 - 5.81 MIL/uL Final  . Hemoglobin 04/04/2013 13.5  13.0 - 17.0 g/dL Final  . HCT 04/04/2013 39.9  39.0 - 52.0 % Final  . MCV 04/04/2013 91.7  78.0 - 100.0 fL Final  . MCH 04/04/2013 31.0  26.0 - 34.0 pg Final  . MCHC 04/04/2013 33.8  30.0 - 36.0 g/dL Final  . RDW 04/04/2013 13.7  11.5 - 15.5 % Final  . Platelets 04/04/2013 135* 150 - 400 K/uL Final  . Sodium 04/04/2013 137  135 - 145 mEq/L Final  . Potassium 04/04/2013 4.2  3.5 - 5.1 mEq/L Final  . Chloride 04/04/2013 102  96 - 112 mEq/L Final  . CO2 04/04/2013 25  19 - 32 mEq/L Final  . Glucose, Bld 04/04/2013 101* 70 - 99 mg/dL Final  . BUN 04/04/2013 21  6 - 23 mg/dL Final  . Creatinine, Ser 04/04/2013 1.25  0.50 - 1.35 mg/dL Final  . Calcium 04/04/2013 9.0  8.4 - 10.5 mg/dL Final  . Total Protein 04/04/2013 6.6  6.0 - 8.3 g/dL Final  .  Albumin 04/04/2013 3.7  3.5 - 5.2 g/dL Final  . AST 04/04/2013 32  0 - 37 U/L Final  . ALT 04/04/2013 19  0 - 53 U/L Final  . Alkaline Phosphatase 04/04/2013 40  39 - 117 U/L Final  . Total Bilirubin 04/04/2013 0.6  0.3 - 1.2 mg/dL Final  . GFR calc non Af Amer 04/04/2013 51* >90 mL/min Final  . GFR calc Af Amer 04/04/2013 60* >90 mL/min Final   Comment: (NOTE)                          The eGFR has been calculated using the CKD EPI equation.                          This calculation has not been validated in all clinical situations.                          eGFR's persistently <90 mL/min signify possible Chronic Kidney                          Disease.  Marland Kitchen Prothrombin Time 04/04/2013 13.2  11.6 - 15.2 seconds Final  . INR 04/04/2013 1.02  0.00 - 1.49 Final  . ABO/RH(D) 04/04/2013 A POS   Final  . Antibody Screen 04/04/2013 NEG   Final  . Sample Expiration 04/04/2013 04/12/2013   Final  . Color,  Urine 04/04/2013 YELLOW  YELLOW Final  . APPearance 04/04/2013 CLOUDY* CLEAR Final  . Specific Gravity, Urine 04/04/2013 1.021  1.005 - 1.030 Final  . pH 04/04/2013 6.0  5.0 - 8.0 Final  . Glucose, UA 04/04/2013 NEGATIVE  NEGATIVE mg/dL Final  . Hgb urine dipstick 04/04/2013 NEGATIVE  NEGATIVE Final  . Bilirubin Urine 04/04/2013 NEGATIVE  NEGATIVE Final  . Ketones, ur 04/04/2013 NEGATIVE  NEGATIVE mg/dL Final  . Protein, ur 04/04/2013 NEGATIVE  NEGATIVE mg/dL Final  . Urobilinogen, UA 04/04/2013 0.2  0.0 - 1.0 mg/dL Final  . Nitrite 04/04/2013 POSITIVE* NEGATIVE Final  . Leukocytes, UA 04/04/2013 SMALL* NEGATIVE Final  . Squamous Epithelial / LPF 04/04/2013 RARE  RARE Final  . WBC, UA 04/04/2013 7-10  <3 WBC/hpf Final   WITH CLUMPS  . Bacteria, UA 04/04/2013 MANY* RARE Final  . Specimen Description 04/04/2013 URINE, RANDOM   Final  . Special Requests 04/04/2013 NONE   Final  . Culture  Setup Time 04/04/2013    Final                   Value:04/04/2013 21:18                         Performed  at [facility]  . Colony Count 04/04/2013    Final                   Value:>=100,000 COLONIES/ML                         Performed at [facility]  . Culture 04/04/2013    Final                   Value:STAPHYLOCOCCUS SPECIES (COAGULASE NEGATIVE)                         Note: RIFAMPIN AND GENTAMICIN SHOULD NOT BE USED AS SINGLE DRUGS FOR TREATMENT OF STAPH INFECTIONS.                         Performed at [facility]  . Report Status 04/04/2013 04/06/2013 FINAL   Final  . Organism ID, Bacteria 04/04/2013 STAPHYLOCOCCUS SPECIES (COAGULASE NEGATIVE)   Final     X-Rays:No results found.  EKG: Orders placed in visit on 10/04/12  . EKG     Hospital Course: [name] [name] is a 79 y.o. who was admitted to [facility]. They were brought to the operating room on 04/09/2013 and underwent Procedure(s): LEFT TOTAL KNEE ARTHROPLASTY.  Patient tolerated the procedure well and was later transferred to the recovery room and then to the orthopaedic floor for postoperative care.  They were given PO and IV analgesics for pain control following their surgery.  They were given 24 hours of postoperative antibiotics of  Anti-infectives   Start     Dose/Rate Route Frequency Ordered Stop   04/09/13 2000  ceFAZolin (ANCEF) IVPB 1 g/50 mL premix     1 g 100 mL/hr over 30 Minutes Intravenous Every 6 hours 04/09/13 1641 04/10/13 0158   04/09/13 1115  ceFAZolin (ANCEF) IVPB 2 g/50 mL premix     2 g 100 mL/hr over 30 Minutes Intravenous On call to O.R. 04/09/13 1102 04/09/13 1355     and started on DVT prophylaxis in the form of Xarelto.   PT and OT  were ordered for total joint protocol.  Discharge planning consulted to help with postop disposition and equipment needs.  Patient had a tough night on the evening of surgery intially.  They started to get up OOB with therapy on day one. Hemovac drain was pulled without difficulty.  Continued to work with therapy into day two.  Dressing was  changed on day two and the incision was healing well.  Patient was seen in rounds and was ready to go home later that same day.   Discharge Medications: Prior to Admission medications   Medication Sig Start Date End Date Taking? Authorizing Provider  docusate sodium (COLACE) 50 MG capsule Take by mouth 2 (two) times daily.   Yes Historical Provider, MD  dutasteride (AVODART) 0.5 MG capsule Take 0.5 mg by mouth daily.   Yes Historical Provider, MD  ezetimibe (ZETIA) 10 MG tablet Take 10 mg by mouth every morning.    Yes Historical Provider, MD  polycarbophil (FIBERCON) 625 MG tablet Take 625 mg by mouth 2 (two) times daily.   Yes Historical Provider, MD  tamsulosin (FLOMAX) 0.4 MG CAPS Take 0.4 mg by mouth daily.    Yes Historical Provider, MD  methocarbamol (ROBAXIN) 500 MG tablet Take 1 tablet (500 mg total) by mouth every 6 (six) hours as needed. 04/11/13   Alexzandrew Perkins, PA-C  olmesartan (BENICAR) 20 MG tablet Take 20 mg by mouth every morning.    Historical Provider, MD  oxyCODONE (OXY IR/ROXICODONE) 5 MG immediate release tablet Take 1-2 tablets (5-10 mg total) by mouth every 3 (three) hours as needed. 04/11/13   Alexzandrew Dara Lords, PA-C  rivaroxaban (XARELTO) 10 MG TABS tablet Take 1 tablet (10 mg total) by mouth daily with breakfast. Take Xarelto for two and a half more weeks, then discontinue Xarelto. Once the patient has completed the Xarelto, they may resume the 81 mg Aspirin. 04/11/13   Alexzandrew Perkins, PA-C  sulfaSALAzine (AZULFIDINE) 500 MG tablet Take 2,000 mg by mouth daily.     Historical Provider, MD   Discharge home with home health  Diet - Cardiac diet  Follow up - in 2 weeks  Activity - WBAT  Disposition - Home  Condition Upon Discharge - Good  D/C Meds - See DC Summary  DVT Prophylaxis - Xarelto       Discharge Orders   Future Orders Complete By Expires   Call MD / Call 911  As directed    Comments:     If you experience chest pain or shortness of  breath, CALL 911 and be transported to the hospital emergency room.  If you develope a fever above 101 F, pus (white drainage) or increased drainage or redness at the wound, or calf pain, call your surgeon's office.   Change dressing  As directed    Comments:     Change dressing daily with sterile 4 x 4 inch gauze dressing and apply TED hose. Do not submerge the incision under water.   Constipation Prevention  As directed    Comments:     Drink plenty of fluids.  Prune juice may be helpful.  You may use a stool softener, such as Colace (over the counter) 100 mg twice a day.  Use MiraLax (over the counter) for constipation as needed.   Diet - low sodium heart healthy  As directed    Discharge instructions  As directed    Comments:     Pick up stool softner and laxative for home. Do not submerge  incision under water. May shower. Continue to use ice for pain and swelling from surgery.  Take Xarelto for two and a half more weeks, then discontinue Xarelto. Once the patient has completed the Xarelto, they may resume the 81 mg Aspirin.   Do not put a pillow under the knee. Place it under the heel.  As directed    Do not sit on low chairs, stoools or toilet seats, as it may be difficult to get up from low surfaces  As directed    Driving restrictions  As directed    Comments:     No driving until released by the physician.   Increase activity slowly as tolerated  As directed    Lifting restrictions  As directed    Comments:     No lifting until released by the physician.   Patient may shower  As directed    Comments:     You may shower without a dressing once there is no drainage.  Do not wash over the wound.  If drainage remains, do not shower until drainage stops.   TED hose  As directed    Comments:     Use stockings (TED hose) for 3 weeks on both leg(s).  You may remove them at night for sleeping.   Weight bearing as tolerated  As directed    Questions:     Laterality:     Extremity:          Medication List    STOP taking these medications       aspirin 81 MG tablet     cholecalciferol 1000 UNITS tablet  Commonly known as:  VITAMIN D     CITRACAL + D PO     fish oil-omega-3 fatty acids 1000 MG capsule     folic acid 1 MG tablet  Commonly known as:  FOLVITE     GARLIC PO     GLUCOSAMINE-CHONDROITIN PO     multivitamin tablet     SAW PALMETTO PO     VITAMIN B COMPLEX PO     vitamin C 500 MG tablet  Commonly known as:  ASCORBIC ACID      TAKE these medications       docusate sodium 50 MG capsule  Commonly known as:  COLACE  Take by mouth 2 (two) times daily.     dutasteride 0.5 MG capsule  Commonly known as:  AVODART  Take 0.5 mg by mouth daily.     ezetimibe 10 MG tablet  Commonly known as:  ZETIA  Take 10 mg by mouth every morning.     methocarbamol 500 MG tablet  Commonly known as:  ROBAXIN  Take 1 tablet (500 mg total) by mouth every 6 (six) hours as needed.     olmesartan 20 MG tablet  Commonly known as:  BENICAR  Take 20 mg by mouth every morning.     oxyCODONE 5 MG immediate release tablet  Commonly known as:  Oxy IR/ROXICODONE  Take 1-2 tablets (5-10 mg total) by mouth every 3 (three) hours as needed.     polycarbophil 625 MG tablet  Commonly known as:  FIBERCON  Take 625 mg by mouth 2 (two) times daily.     rivaroxaban 10 MG Tabs tablet  Commonly known as:  XARELTO  - Take 1 tablet (10 mg total) by mouth daily with breakfast. Take Xarelto for two and a half more weeks, then discontinue Xarelto.  - Once the patient has completed  the Xarelto, they may resume the 81 mg Aspirin.     sulfaSALAzine 500 MG tablet  Commonly known as:  AZULFIDINE  Take 2,000 mg by mouth daily.     tamsulosin 0.4 MG Caps capsule  Commonly known as:  FLOMAX  Take 0.4 mg by mouth daily.       Follow-up Information   Follow up with Gearlean Alf, MD. Schedule an appointment as soon as possible for a visit on 04/24/2013.   Specialty:   Orthopedic Surgery   Contact information:   [address] [place] [address] [id number]       Signed: Mickel Crow 05/01/2013, 11:34 AM

## 2013-04-12 DIAGNOSIS — Z471 Aftercare following joint replacement surgery: Secondary | ICD-10-CM | POA: Diagnosis not present

## 2013-04-12 DIAGNOSIS — E785 Hyperlipidemia, unspecified: Secondary | ICD-10-CM | POA: Diagnosis not present

## 2013-04-12 DIAGNOSIS — Z96659 Presence of unspecified artificial knee joint: Secondary | ICD-10-CM | POA: Diagnosis not present

## 2013-04-12 DIAGNOSIS — IMO0001 Reserved for inherently not codable concepts without codable children: Secondary | ICD-10-CM | POA: Diagnosis not present

## 2013-04-12 DIAGNOSIS — I10 Essential (primary) hypertension: Secondary | ICD-10-CM | POA: Diagnosis not present

## 2013-04-13 DIAGNOSIS — IMO0001 Reserved for inherently not codable concepts without codable children: Secondary | ICD-10-CM | POA: Diagnosis not present

## 2013-04-13 DIAGNOSIS — Z96659 Presence of unspecified artificial knee joint: Secondary | ICD-10-CM | POA: Diagnosis not present

## 2013-04-13 DIAGNOSIS — I10 Essential (primary) hypertension: Secondary | ICD-10-CM | POA: Diagnosis not present

## 2013-04-13 DIAGNOSIS — E785 Hyperlipidemia, unspecified: Secondary | ICD-10-CM | POA: Diagnosis not present

## 2013-04-13 DIAGNOSIS — Z471 Aftercare following joint replacement surgery: Secondary | ICD-10-CM | POA: Diagnosis not present

## 2013-04-16 DIAGNOSIS — Z471 Aftercare following joint replacement surgery: Secondary | ICD-10-CM | POA: Diagnosis not present

## 2013-04-16 DIAGNOSIS — Z96659 Presence of unspecified artificial knee joint: Secondary | ICD-10-CM | POA: Diagnosis not present

## 2013-04-16 DIAGNOSIS — IMO0001 Reserved for inherently not codable concepts without codable children: Secondary | ICD-10-CM | POA: Diagnosis not present

## 2013-04-16 DIAGNOSIS — I10 Essential (primary) hypertension: Secondary | ICD-10-CM | POA: Diagnosis not present

## 2013-04-16 DIAGNOSIS — E785 Hyperlipidemia, unspecified: Secondary | ICD-10-CM | POA: Diagnosis not present

## 2013-04-17 DIAGNOSIS — I10 Essential (primary) hypertension: Secondary | ICD-10-CM | POA: Diagnosis not present

## 2013-04-17 DIAGNOSIS — Z471 Aftercare following joint replacement surgery: Secondary | ICD-10-CM | POA: Diagnosis not present

## 2013-04-17 DIAGNOSIS — IMO0001 Reserved for inherently not codable concepts without codable children: Secondary | ICD-10-CM | POA: Diagnosis not present

## 2013-04-17 DIAGNOSIS — E785 Hyperlipidemia, unspecified: Secondary | ICD-10-CM | POA: Diagnosis not present

## 2013-04-17 DIAGNOSIS — Z96659 Presence of unspecified artificial knee joint: Secondary | ICD-10-CM | POA: Diagnosis not present

## 2013-04-18 DIAGNOSIS — E785 Hyperlipidemia, unspecified: Secondary | ICD-10-CM | POA: Diagnosis not present

## 2013-04-18 DIAGNOSIS — I10 Essential (primary) hypertension: Secondary | ICD-10-CM | POA: Diagnosis not present

## 2013-04-18 DIAGNOSIS — Z96659 Presence of unspecified artificial knee joint: Secondary | ICD-10-CM | POA: Diagnosis not present

## 2013-04-18 DIAGNOSIS — Z471 Aftercare following joint replacement surgery: Secondary | ICD-10-CM | POA: Diagnosis not present

## 2013-04-18 DIAGNOSIS — IMO0001 Reserved for inherently not codable concepts without codable children: Secondary | ICD-10-CM | POA: Diagnosis not present

## 2013-04-19 DIAGNOSIS — E785 Hyperlipidemia, unspecified: Secondary | ICD-10-CM | POA: Diagnosis not present

## 2013-04-19 DIAGNOSIS — Z471 Aftercare following joint replacement surgery: Secondary | ICD-10-CM | POA: Diagnosis not present

## 2013-04-19 DIAGNOSIS — IMO0001 Reserved for inherently not codable concepts without codable children: Secondary | ICD-10-CM | POA: Diagnosis not present

## 2013-04-19 DIAGNOSIS — Z96659 Presence of unspecified artificial knee joint: Secondary | ICD-10-CM | POA: Diagnosis not present

## 2013-04-19 DIAGNOSIS — I10 Essential (primary) hypertension: Secondary | ICD-10-CM | POA: Diagnosis not present

## 2013-04-21 DIAGNOSIS — Z96659 Presence of unspecified artificial knee joint: Secondary | ICD-10-CM | POA: Diagnosis not present

## 2013-04-21 DIAGNOSIS — I10 Essential (primary) hypertension: Secondary | ICD-10-CM | POA: Diagnosis not present

## 2013-04-21 DIAGNOSIS — E785 Hyperlipidemia, unspecified: Secondary | ICD-10-CM | POA: Diagnosis not present

## 2013-04-21 DIAGNOSIS — Z471 Aftercare following joint replacement surgery: Secondary | ICD-10-CM | POA: Diagnosis not present

## 2013-04-21 DIAGNOSIS — IMO0001 Reserved for inherently not codable concepts without codable children: Secondary | ICD-10-CM | POA: Diagnosis not present

## 2013-04-23 DIAGNOSIS — IMO0001 Reserved for inherently not codable concepts without codable children: Secondary | ICD-10-CM | POA: Diagnosis not present

## 2013-04-23 DIAGNOSIS — E785 Hyperlipidemia, unspecified: Secondary | ICD-10-CM | POA: Diagnosis not present

## 2013-04-23 DIAGNOSIS — Z471 Aftercare following joint replacement surgery: Secondary | ICD-10-CM | POA: Diagnosis not present

## 2013-04-23 DIAGNOSIS — I10 Essential (primary) hypertension: Secondary | ICD-10-CM | POA: Diagnosis not present

## 2013-04-23 DIAGNOSIS — Z96659 Presence of unspecified artificial knee joint: Secondary | ICD-10-CM | POA: Diagnosis not present

## 2013-04-25 DIAGNOSIS — IMO0001 Reserved for inherently not codable concepts without codable children: Secondary | ICD-10-CM | POA: Diagnosis not present

## 2013-04-25 DIAGNOSIS — I10 Essential (primary) hypertension: Secondary | ICD-10-CM | POA: Diagnosis not present

## 2013-04-25 DIAGNOSIS — Z471 Aftercare following joint replacement surgery: Secondary | ICD-10-CM | POA: Diagnosis not present

## 2013-04-25 DIAGNOSIS — E785 Hyperlipidemia, unspecified: Secondary | ICD-10-CM | POA: Diagnosis not present

## 2013-04-25 DIAGNOSIS — Z96659 Presence of unspecified artificial knee joint: Secondary | ICD-10-CM | POA: Diagnosis not present

## 2013-04-27 DIAGNOSIS — IMO0001 Reserved for inherently not codable concepts without codable children: Secondary | ICD-10-CM | POA: Diagnosis not present

## 2013-04-27 DIAGNOSIS — Z471 Aftercare following joint replacement surgery: Secondary | ICD-10-CM | POA: Diagnosis not present

## 2013-04-27 DIAGNOSIS — I10 Essential (primary) hypertension: Secondary | ICD-10-CM | POA: Diagnosis not present

## 2013-04-27 DIAGNOSIS — E785 Hyperlipidemia, unspecified: Secondary | ICD-10-CM | POA: Diagnosis not present

## 2013-04-27 DIAGNOSIS — Z96659 Presence of unspecified artificial knee joint: Secondary | ICD-10-CM | POA: Diagnosis not present

## 2013-04-30 DIAGNOSIS — M25569 Pain in unspecified knee: Secondary | ICD-10-CM | POA: Diagnosis not present

## 2013-05-02 DIAGNOSIS — M25569 Pain in unspecified knee: Secondary | ICD-10-CM | POA: Diagnosis not present

## 2013-05-04 DIAGNOSIS — M25569 Pain in unspecified knee: Secondary | ICD-10-CM | POA: Diagnosis not present

## 2013-05-07 DIAGNOSIS — M25569 Pain in unspecified knee: Secondary | ICD-10-CM | POA: Diagnosis not present

## 2013-05-09 DIAGNOSIS — M25569 Pain in unspecified knee: Secondary | ICD-10-CM | POA: Diagnosis not present

## 2013-05-11 DIAGNOSIS — M25569 Pain in unspecified knee: Secondary | ICD-10-CM | POA: Diagnosis not present

## 2013-05-14 DIAGNOSIS — M25569 Pain in unspecified knee: Secondary | ICD-10-CM | POA: Diagnosis not present

## 2013-05-16 DIAGNOSIS — D485 Neoplasm of uncertain behavior of skin: Secondary | ICD-10-CM | POA: Diagnosis not present

## 2013-05-16 DIAGNOSIS — L219 Seborrheic dermatitis, unspecified: Secondary | ICD-10-CM | POA: Diagnosis not present

## 2013-05-16 DIAGNOSIS — Z85828 Personal history of other malignant neoplasm of skin: Secondary | ICD-10-CM | POA: Diagnosis not present

## 2013-05-16 DIAGNOSIS — M25569 Pain in unspecified knee: Secondary | ICD-10-CM | POA: Diagnosis not present

## 2013-05-18 DIAGNOSIS — M25569 Pain in unspecified knee: Secondary | ICD-10-CM | POA: Diagnosis not present

## 2013-06-08 DIAGNOSIS — C61 Malignant neoplasm of prostate: Secondary | ICD-10-CM | POA: Diagnosis not present

## 2013-06-08 DIAGNOSIS — K5190 Ulcerative colitis, unspecified, without complications: Secondary | ICD-10-CM | POA: Diagnosis not present

## 2013-06-08 DIAGNOSIS — N139 Obstructive and reflux uropathy, unspecified: Secondary | ICD-10-CM | POA: Diagnosis not present

## 2013-06-08 DIAGNOSIS — G609 Hereditary and idiopathic neuropathy, unspecified: Secondary | ICD-10-CM | POA: Diagnosis not present

## 2013-06-08 DIAGNOSIS — I10 Essential (primary) hypertension: Secondary | ICD-10-CM | POA: Diagnosis not present

## 2013-06-08 DIAGNOSIS — Z0000 Encounter for general adult medical examination without abnormal findings: Secondary | ICD-10-CM | POA: Diagnosis not present

## 2013-06-08 DIAGNOSIS — Z86010 Personal history of colonic polyps: Secondary | ICD-10-CM | POA: Diagnosis not present

## 2013-06-08 DIAGNOSIS — N183 Chronic kidney disease, stage 3 unspecified: Secondary | ICD-10-CM | POA: Diagnosis not present

## 2013-06-29 DIAGNOSIS — M25569 Pain in unspecified knee: Secondary | ICD-10-CM | POA: Diagnosis not present

## 2013-06-29 DIAGNOSIS — Z96659 Presence of unspecified artificial knee joint: Secondary | ICD-10-CM | POA: Diagnosis not present

## 2013-07-04 ENCOUNTER — Ambulatory Visit: Payer: Medicare Other

## 2013-07-18 ENCOUNTER — Ambulatory Visit: Payer: Medicare Other

## 2013-07-25 ENCOUNTER — Ambulatory Visit (INDEPENDENT_AMBULATORY_CARE_PROVIDER_SITE_OTHER): Payer: Medicare Other

## 2013-07-25 ENCOUNTER — Telehealth: Payer: Self-pay

## 2013-07-25 VITALS — BP 151/87 | HR 86 | Resp 12

## 2013-07-25 DIAGNOSIS — B070 Plantar wart: Secondary | ICD-10-CM | POA: Diagnosis not present

## 2013-07-25 DIAGNOSIS — Q828 Other specified congenital malformations of skin: Secondary | ICD-10-CM | POA: Diagnosis not present

## 2013-07-25 NOTE — Telephone Encounter (Signed)
error 

## 2013-07-25 NOTE — Patient Instructions (Signed)
WARTS (Verrucae)  Warts are caused by a virus that has invaded the skin.  They are more common in young adults and children and a small percentage will resolve on their own.  There are many types of warts including mosaic warts (large flat), vulgaris (domed warts-have pearl like appearance), and plantar warts (flat or cauliflower like appearance).  Warts are highly contagious and may be picked up from any surface.  Warts thrive in a warm moist environment and are common near pools, showers, and locker room floors.  Any microscopic cut in the skin is where the virus enters and becomes a wart.  Warts are very difficult to treat and get rid of.  Patience is necessary in the treatment of this virus.  It may take months to cure and different methods may have to be used to get rid of your wart.  Standard Initial Treatment is: 1. Periodic debridement of the wart and application of Canthacur to each lesion (a blistering agent that will slough off the warty skin) 2. Dispensing of topical treatments/prescriptions to apply to the wart at home  Other options include: 1. Excision of the lesion-numbing the skin around the wart and cutting it out-requires daily soaks post-operatively and takes about 2-3 weeks to fully heal 2. Excision with CO2 Laser-Performed at the surgical center your foot is numbed up and the lesions are all cut out and then lasered with a high power laser.  Very good for multiple warts that are resistant. 3. Cimetidine (Tagamet)-Oral agent used in high does--has shown better results in children  How do I apply the standard topical treatments?  1. Salicylic Acid (Compound W wart remover liquid or gel-available at drug or grocery stores)-Apply a dime size thickness over the wart and cover with duct tape-apply at night so the medication does not spread out to the good skin.  The skin will turn white and slowly blister off.  Use a pumice stone daily to remove the white skin as best you can.  If  the skin gets too raw and painful, discontinue for a few days then resume. 2. Aldara (Imiquimod)-this is an immune response modifier.  They come in little packets so try to get at least 2 days out of each packet if you can.  Apply a small amount to the lesion and cover with duct tape.  Do not rub it in-let it absorb on its own.  Good to apply each morning.  Other Helpful Hints:  Wash shoes that can be washed in the washing machine 2-3 x per month with some bleach  Use Lysol in shoes that cannot be washed and wipe out with a cloth 1 x per week-allow to dry for 8 hours before wearing again  Use a bleach solution (1 part bleach to 3 parts water) in your tub or shower to reduce the spread of the virus to yourself and others  Use aqua socks or clean sandals when at the pool or locker room to reduce the chance of picking up the virus or spreading it to others  Continue applying topical salicylic acid pads after 1 week break the next 7 days just apply some Neosporin or Polysporin and a Band-Aid to the area that spots continue be a problem we really applied the salicylic acid as instructed

## 2013-07-25 NOTE — Progress Notes (Signed)
   Subjective:    Patient ID: [name] [name], male    DOB: 1928/03/19, 80 y.o.   MRN: [id number]  HPI Comments: '' RT FOOT THE SKIN IS GETTING THICKER.''     Review of Systems no new changes or find     Objective:   Physical Exam Neurovascular status is intact pedal pulses palpable DP and PT posterior were for. Patient's head keratotic lesions the left foot is doing well however is been applying salicylic acid sub-1 right although there is improvement there is some eschar tissue hemorrhage a keratoses is debrided away at this time there still several areas may cluster of pinpoint nucleated keratotic lesions consistent with verruca plantaris versus porokeratosis to history with lumicain and Silvadene and gauze dressing being applied. Patient is advised to take 1 week break from salicylic acid application and then resume application of lesions persist thereafter.       Assessment & Plan:  Assessment verruca plantaris for support keratoses sub-1 right keratotic lesion debridement way Silvadene gauze dressing applied likely noncovered service for debridement of keratoses will need site ABN form followup in the future and as-needed basis if for the recurrence or exacerbations  Harriet Masson DPM

## 2013-08-29 ENCOUNTER — Other Ambulatory Visit: Payer: Self-pay

## 2013-08-29 DIAGNOSIS — B079 Viral wart, unspecified: Secondary | ICD-10-CM | POA: Diagnosis not present

## 2013-08-29 DIAGNOSIS — D045 Carcinoma in situ of skin of trunk: Secondary | ICD-10-CM | POA: Diagnosis not present

## 2013-08-29 DIAGNOSIS — C44529 Squamous cell carcinoma of skin of other part of trunk: Secondary | ICD-10-CM | POA: Diagnosis not present

## 2013-09-13 DIAGNOSIS — M25569 Pain in unspecified knee: Secondary | ICD-10-CM | POA: Diagnosis not present

## 2013-09-13 DIAGNOSIS — Z96659 Presence of unspecified artificial knee joint: Secondary | ICD-10-CM | POA: Diagnosis not present

## 2013-09-19 DIAGNOSIS — M79609 Pain in unspecified limb: Secondary | ICD-10-CM | POA: Diagnosis not present

## 2013-09-19 DIAGNOSIS — I872 Venous insufficiency (chronic) (peripheral): Secondary | ICD-10-CM | POA: Diagnosis not present

## 2013-09-26 DIAGNOSIS — M79609 Pain in unspecified limb: Secondary | ICD-10-CM | POA: Diagnosis not present

## 2013-09-26 DIAGNOSIS — I872 Venous insufficiency (chronic) (peripheral): Secondary | ICD-10-CM | POA: Diagnosis not present

## 2013-10-04 DIAGNOSIS — I872 Venous insufficiency (chronic) (peripheral): Secondary | ICD-10-CM | POA: Diagnosis not present

## 2013-10-10 DIAGNOSIS — B079 Viral wart, unspecified: Secondary | ICD-10-CM | POA: Diagnosis not present

## 2013-10-10 DIAGNOSIS — L570 Actinic keratosis: Secondary | ICD-10-CM | POA: Diagnosis not present

## 2013-10-10 DIAGNOSIS — Z85828 Personal history of other malignant neoplasm of skin: Secondary | ICD-10-CM | POA: Diagnosis not present

## 2013-10-11 DIAGNOSIS — R972 Elevated prostate specific antigen [PSA]: Secondary | ICD-10-CM | POA: Diagnosis not present

## 2013-10-11 DIAGNOSIS — C61 Malignant neoplasm of prostate: Secondary | ICD-10-CM | POA: Diagnosis not present

## 2013-12-27 DIAGNOSIS — C61 Malignant neoplasm of prostate: Secondary | ICD-10-CM | POA: Diagnosis not present

## 2013-12-27 DIAGNOSIS — N138 Other obstructive and reflux uropathy: Secondary | ICD-10-CM | POA: Diagnosis not present

## 2013-12-27 DIAGNOSIS — N401 Enlarged prostate with lower urinary tract symptoms: Secondary | ICD-10-CM | POA: Diagnosis not present

## 2013-12-27 DIAGNOSIS — N319 Neuromuscular dysfunction of bladder, unspecified: Secondary | ICD-10-CM | POA: Diagnosis not present

## 2013-12-27 DIAGNOSIS — R339 Retention of urine, unspecified: Secondary | ICD-10-CM | POA: Diagnosis not present

## 2014-01-02 DIAGNOSIS — I8310 Varicose veins of unspecified lower extremity with inflammation: Secondary | ICD-10-CM | POA: Diagnosis not present

## 2014-01-03 DIAGNOSIS — I8310 Varicose veins of unspecified lower extremity with inflammation: Secondary | ICD-10-CM | POA: Diagnosis not present

## 2014-01-09 DIAGNOSIS — B078 Other viral warts: Secondary | ICD-10-CM | POA: Diagnosis not present

## 2014-01-09 DIAGNOSIS — M79609 Pain in unspecified limb: Secondary | ICD-10-CM | POA: Diagnosis not present

## 2014-01-09 DIAGNOSIS — I8310 Varicose veins of unspecified lower extremity with inflammation: Secondary | ICD-10-CM | POA: Diagnosis not present

## 2014-01-09 DIAGNOSIS — L570 Actinic keratosis: Secondary | ICD-10-CM | POA: Diagnosis not present

## 2014-01-17 DIAGNOSIS — I8000 Phlebitis and thrombophlebitis of superficial vessels of unspecified lower extremity: Secondary | ICD-10-CM | POA: Diagnosis not present

## 2014-01-17 DIAGNOSIS — I8310 Varicose veins of unspecified lower extremity with inflammation: Secondary | ICD-10-CM | POA: Diagnosis not present

## 2014-02-14 ENCOUNTER — Encounter: Payer: Self-pay | Admitting: Cardiology

## 2014-02-14 ENCOUNTER — Ambulatory Visit (INDEPENDENT_AMBULATORY_CARE_PROVIDER_SITE_OTHER): Payer: Medicare Other | Admitting: Cardiology

## 2014-02-14 VITALS — BP 110/70 | HR 85 | Ht 69.5 in | Wt 184.0 lb

## 2014-02-14 DIAGNOSIS — R609 Edema, unspecified: Secondary | ICD-10-CM | POA: Diagnosis not present

## 2014-02-14 DIAGNOSIS — I10 Essential (primary) hypertension: Secondary | ICD-10-CM

## 2014-02-14 NOTE — Patient Instructions (Signed)
Continue your current therapy  I will see you in one year   

## 2014-02-14 NOTE — Progress Notes (Signed)
   Bernadette Tirella Date of Birth: 07-06-1927 Medical Record [id number]  History of Present Illness: Mr. Roye is seen for followup of his edema. He has a history of lower extremity edema mostly on the left due to venous insufficiency.  Lower extremity venous Dopplers  showed no DVT. His echocardiogram showed normal LV function. He had aortic valve sclerosis. There was mild to moderate mitral insufficiency. He is now seeing a vein specialist. Continues to use support hose. He has occasional low BP.   Current Outpatient Prescriptions on File Prior to Visit  Medication Sig Dispense Refill  . dutasteride (AVODART) 0.5 MG capsule Take 0.5 mg by mouth daily.      Marland Kitchen ezetimibe (ZETIA) 10 MG tablet Take 10 mg by mouth every morning.       . sulfaSALAzine (AZULFIDINE) 500 MG tablet Take 2,000 mg by mouth daily.       . tamsulosin (FLOMAX) 0.4 MG CAPS Take 0.4 mg by mouth daily.        No current facility-administered medications on file prior to visit.    No Known Allergies  Past Medical History  Diagnosis Date  . Peripheral neuropathy     Elevated by Dr. love 2010  . Essential hypertension   . Hyperlipidemia   . Essential tremor   . Obstructive uropathy   . Hypotonic bladder     L4-L5  . Prostate cancer 2005  . Edema     Past Surgical History  Procedure Laterality Date  . Skin cancer excision  1974    On chest wall  . Achilles tendon surgery  1995  . Colonoscopy      With polyp resection  . Total knee arthroplasty  2011  . Total knee arthroplasty Left 04/09/2013    Procedure: LEFT TOTAL KNEE ARTHROPLASTY;  Surgeon: Gearlean Alf, MD;  Location: [facility];  Service: Orthopedics;  Laterality: Left;    History  Smoking status  . Never Smoker   Smokeless tobacco  . Not on file    History  Alcohol Use  . 0.6 oz/week  . 1 Glasses of wine per week    Family History  Problem Relation Age of Onset  . Hypertension Mother   . Heart failure Mother   . Diabetes Mother      Review of Systems: As noted in history of present illness.  All other systems were reviewed and are negative.  Physical Exam: BP 110/70  Pulse 85  Ht 5' 9.5" (1.765 m)  Wt 184 lb (83.462 kg)  BMI 26.79 kg/m2 He is a pleasant white male who appears younger than his stated age. HEENT: Normal. He has no JVD, adenopathy, thyromegaly, or bruits. Lungs: Clear Cardiovascular: Regular rate and rhythm, normal S1 and S2, grade 1/6 systolic murmur at the left upper sternal border. No diastolic murmur or S3. Abdomen: Overweight, soft, nontender. No masses or hepatosplenomegaly. Extremities: Femoral and pedal pulses are 2+. He 1-2+ edema on the left. He has prominent venous varicosities Skin: Warm and dry Neuro: Alert and oriented x3. Cranial nerves II through XII are intact.  LABORATORY DATA:  Ecg: NSR with nonspecific TWA  Assessment / Plan: 1. Edema. Secondary to venous insufficiency. His edema resolves at night with elevation.   I recommended conservative measures with elevation of his feet when possible, sodium restriction, and use of compression stockings.  2. Hypercholesterolemia. Patient is intolerant to statins. He is currently on Zetia.

## 2014-02-15 DIAGNOSIS — K08109 Complete loss of teeth, unspecified cause, unspecified class: Secondary | ICD-10-CM | POA: Diagnosis not present

## 2014-02-20 DIAGNOSIS — M7989 Other specified soft tissue disorders: Secondary | ICD-10-CM | POA: Diagnosis not present

## 2014-02-20 DIAGNOSIS — I8310 Varicose veins of unspecified lower extremity with inflammation: Secondary | ICD-10-CM | POA: Diagnosis not present

## 2014-02-21 ENCOUNTER — Other Ambulatory Visit: Payer: Self-pay

## 2014-02-21 DIAGNOSIS — D485 Neoplasm of uncertain behavior of skin: Secondary | ICD-10-CM | POA: Diagnosis not present

## 2014-02-21 DIAGNOSIS — C44519 Basal cell carcinoma of skin of other part of trunk: Secondary | ICD-10-CM | POA: Diagnosis not present

## 2014-02-21 DIAGNOSIS — L570 Actinic keratosis: Secondary | ICD-10-CM | POA: Diagnosis not present

## 2014-02-21 DIAGNOSIS — L821 Other seborrheic keratosis: Secondary | ICD-10-CM | POA: Diagnosis not present

## 2014-02-21 DIAGNOSIS — D235 Other benign neoplasm of skin of trunk: Secondary | ICD-10-CM | POA: Diagnosis not present

## 2014-02-21 DIAGNOSIS — L819 Disorder of pigmentation, unspecified: Secondary | ICD-10-CM | POA: Diagnosis not present

## 2014-03-13 DIAGNOSIS — M7981 Nontraumatic hematoma of soft tissue: Secondary | ICD-10-CM | POA: Diagnosis not present

## 2014-03-13 DIAGNOSIS — M7989 Other specified soft tissue disorders: Secondary | ICD-10-CM | POA: Diagnosis not present

## 2014-03-13 DIAGNOSIS — I8310 Varicose veins of unspecified lower extremity with inflammation: Secondary | ICD-10-CM | POA: Diagnosis not present

## 2014-03-28 DIAGNOSIS — R600 Localized edema: Secondary | ICD-10-CM | POA: Diagnosis not present

## 2014-03-28 DIAGNOSIS — L570 Actinic keratosis: Secondary | ICD-10-CM | POA: Diagnosis not present

## 2014-03-28 DIAGNOSIS — Z23 Encounter for immunization: Secondary | ICD-10-CM | POA: Diagnosis not present

## 2014-03-28 DIAGNOSIS — Z85828 Personal history of other malignant neoplasm of skin: Secondary | ICD-10-CM | POA: Diagnosis not present

## 2014-03-28 DIAGNOSIS — I83892 Varicose veins of left lower extremities with other complications: Secondary | ICD-10-CM | POA: Diagnosis not present

## 2014-03-28 DIAGNOSIS — Z08 Encounter for follow-up examination after completed treatment for malignant neoplasm: Secondary | ICD-10-CM | POA: Diagnosis not present

## 2014-03-30 DIAGNOSIS — Z23 Encounter for immunization: Secondary | ICD-10-CM | POA: Diagnosis not present

## 2014-05-16 DIAGNOSIS — E782 Mixed hyperlipidemia: Secondary | ICD-10-CM | POA: Diagnosis not present

## 2014-05-16 DIAGNOSIS — Z0000 Encounter for general adult medical examination without abnormal findings: Secondary | ICD-10-CM | POA: Diagnosis not present

## 2014-05-16 DIAGNOSIS — K5150 Left sided colitis without complications: Secondary | ICD-10-CM | POA: Diagnosis not present

## 2014-05-16 DIAGNOSIS — Z23 Encounter for immunization: Secondary | ICD-10-CM | POA: Diagnosis not present

## 2014-05-16 DIAGNOSIS — N183 Chronic kidney disease, stage 3 (moderate): Secondary | ICD-10-CM | POA: Diagnosis not present

## 2014-05-16 DIAGNOSIS — C61 Malignant neoplasm of prostate: Secondary | ICD-10-CM | POA: Diagnosis not present

## 2014-05-16 DIAGNOSIS — I10 Essential (primary) hypertension: Secondary | ICD-10-CM | POA: Diagnosis not present

## 2014-05-16 DIAGNOSIS — M4806 Spinal stenosis, lumbar region: Secondary | ICD-10-CM | POA: Diagnosis not present

## 2014-05-16 DIAGNOSIS — Z86010 Personal history of colonic polyps: Secondary | ICD-10-CM | POA: Diagnosis not present

## 2014-06-13 DIAGNOSIS — M65342 Trigger finger, left ring finger: Secondary | ICD-10-CM | POA: Diagnosis not present

## 2014-06-13 DIAGNOSIS — M19042 Primary osteoarthritis, left hand: Secondary | ICD-10-CM | POA: Diagnosis not present

## 2014-06-26 DIAGNOSIS — C61 Malignant neoplasm of prostate: Secondary | ICD-10-CM | POA: Diagnosis not present

## 2014-06-26 DIAGNOSIS — R339 Retention of urine, unspecified: Secondary | ICD-10-CM | POA: Diagnosis not present

## 2014-08-13 ENCOUNTER — Encounter (HOSPITAL_COMMUNITY): Payer: Self-pay | Admitting: Emergency Medicine

## 2014-08-13 ENCOUNTER — Inpatient Hospital Stay (HOSPITAL_COMMUNITY)
Admission: EM | Admit: 2014-08-13 | Discharge: 2014-08-15 | DRG: 194 | Disposition: A | Discharge status: Home / Self Care | Payer: Medicare HMO | Attending: Internal Medicine | Admitting: Internal Medicine

## 2014-08-13 ENCOUNTER — Emergency Department (HOSPITAL_COMMUNITY): Payer: Medicare HMO

## 2014-08-13 DIAGNOSIS — Z8249 Family history of ischemic heart disease and other diseases of the circulatory system: Secondary | ICD-10-CM

## 2014-08-13 DIAGNOSIS — E785 Hyperlipidemia, unspecified: Secondary | ICD-10-CM | POA: Diagnosis present

## 2014-08-13 DIAGNOSIS — K5190 Ulcerative colitis, unspecified, without complications: Secondary | ICD-10-CM | POA: Diagnosis present

## 2014-08-13 DIAGNOSIS — N400 Enlarged prostate without lower urinary tract symptoms: Secondary | ICD-10-CM | POA: Diagnosis present

## 2014-08-13 DIAGNOSIS — K529 Noninfective gastroenteritis and colitis, unspecified: Secondary | ICD-10-CM | POA: Diagnosis present

## 2014-08-13 DIAGNOSIS — I10 Essential (primary) hypertension: Secondary | ICD-10-CM | POA: Diagnosis present

## 2014-08-13 DIAGNOSIS — M179 Osteoarthritis of knee, unspecified: Secondary | ICD-10-CM | POA: Diagnosis present

## 2014-08-13 DIAGNOSIS — Z833 Family history of diabetes mellitus: Secondary | ICD-10-CM | POA: Diagnosis not present

## 2014-08-13 DIAGNOSIS — Z7982 Long term (current) use of aspirin: Secondary | ICD-10-CM | POA: Diagnosis not present

## 2014-08-13 DIAGNOSIS — J189 Pneumonia, unspecified organism: Secondary | ICD-10-CM | POA: Diagnosis present

## 2014-08-13 DIAGNOSIS — Z79899 Other long term (current) drug therapy: Secondary | ICD-10-CM | POA: Diagnosis not present

## 2014-08-13 DIAGNOSIS — Z96652 Presence of left artificial knee joint: Secondary | ICD-10-CM | POA: Diagnosis present

## 2014-08-13 DIAGNOSIS — Z8546 Personal history of malignant neoplasm of prostate: Secondary | ICD-10-CM

## 2014-08-13 DIAGNOSIS — Z85828 Personal history of other malignant neoplasm of skin: Secondary | ICD-10-CM | POA: Diagnosis not present

## 2014-08-13 DIAGNOSIS — R0602 Shortness of breath: Secondary | ICD-10-CM | POA: Diagnosis present

## 2014-08-13 DIAGNOSIS — M1710 Unilateral primary osteoarthritis, unspecified knee: Secondary | ICD-10-CM | POA: Diagnosis present

## 2014-08-13 DIAGNOSIS — A419 Sepsis, unspecified organism: Secondary | ICD-10-CM | POA: Diagnosis present

## 2014-08-13 LAB — CBC WITH DIFFERENTIAL/PLATELET
Basophils Absolute: 0 10*3/uL (ref 0.0–0.1)
Basophils Relative: 0 % (ref 0–1)
Eosinophils Absolute: 0 10*3/uL (ref 0.0–0.7)
Eosinophils Relative: 0 % (ref 0–5)
HCT: 40.6 % (ref 39.0–52.0)
Hemoglobin: 13.8 g/dL (ref 13.0–17.0)
Lymphocytes Relative: 5 % — ABNORMAL LOW (ref 12–46)
Lymphs Abs: 1.2 10*3/uL (ref 0.7–4.0)
MCH: 31.8 pg (ref 26.0–34.0)
MCHC: 34 g/dL (ref 30.0–36.0)
MCV: 93.5 fL (ref 78.0–100.0)
Monocytes Absolute: 1.2 10*3/uL — ABNORMAL HIGH (ref 0.1–1.0)
Monocytes Relative: 5 % (ref 3–12)
Neutro Abs: 21.2 10*3/uL — ABNORMAL HIGH (ref 1.7–7.7)
Neutrophils Relative %: 90 % — ABNORMAL HIGH (ref 43–77)
Platelets: 151 10*3/uL (ref 150–400)
RBC: 4.34 MIL/uL (ref 4.22–5.81)
RDW: 13.6 % (ref 11.5–15.5)
WBC: 23.6 10*3/uL — ABNORMAL HIGH (ref 4.0–10.5)

## 2014-08-13 LAB — BASIC METABOLIC PANEL
Anion gap: 9 (ref 5–15)
BUN: 27 mg/dL — ABNORMAL HIGH (ref 6–23)
CO2: 25 mmol/L (ref 19–32)
Calcium: 8.9 mg/dL (ref 8.4–10.5)
Chloride: 103 mmol/L (ref 96–112)
Creatinine, Ser: 1.34 mg/dL (ref 0.50–1.35)
GFR calc Af Amer: 54 mL/min — ABNORMAL LOW (ref >90)
GFR calc non Af Amer: 47 mL/min — ABNORMAL LOW (ref >90)
Glucose, Bld: 128 mg/dL — ABNORMAL HIGH (ref 70–99)
Potassium: 3.9 mmol/L (ref 3.5–5.1)
Sodium: 137 mmol/L (ref 135–145)

## 2014-08-13 LAB — I-STAT CG4 LACTIC ACID, ED: Lactic Acid, Venous: 1.01 mmol/L (ref 0.5–2.0)

## 2014-08-13 MED ORDER — DEXTROSE 5 % IV SOLN
500.0000 mg | Freq: Once | INTRAVENOUS | Status: AC
  Administered 2014-08-13: 500 mg via INTRAVENOUS
  Filled 2014-08-13: qty 500

## 2014-08-13 MED ORDER — DEXTROSE 5 % IV SOLN
1.0000 g | Freq: Once | INTRAVENOUS | Status: AC
  Administered 2014-08-13: 1 g via INTRAVENOUS
  Filled 2014-08-13: qty 10

## 2014-08-13 MED ORDER — SODIUM CHLORIDE 0.9 % IV SOLN
INTRAVENOUS | Status: DC
  Administered 2014-08-13: 22:00:00 via INTRAVENOUS

## 2014-08-13 NOTE — ED Notes (Signed)
Pt ambulated to restroom independently w/ steady gait - attempted to provide urine sample however states he is unable to do so at this time.

## 2014-08-13 NOTE — ED Notes (Signed)
Pt was sent here from [facility] with LLL pneumonia  Pt states 3 weeks ago he was in [place] and then spent a few days in [place]  Pt states while away he developed sinus drainage that he has been treating with over the counter medications  Pt last night had an episode where he got up and was confused, had one episode of vomiting, general weakness, and dizziness

## 2014-08-13 NOTE — ED Provider Notes (Signed)
CSN: [id number]     Arrival date & time 08/13/14  2033 History   First MD Initiated Contact with Patient 08/13/14 2051     Chief Complaint  Patient presents with  . Pneumonia     (Consider location/radiation/quality/duration/timing/severity/associated sxs/prior Treatment) HPI    PCP: [name], MD Blood pressure 115/74, pulse 91, temperature 98 F (36.7 C), temperature source Oral, SpO2 90 %.  [name] [name] is a 81 y.o.male with a significant PMH of peripheral neuropathy, essential hypertension, hyperlipidemia, obstructive uropathy, hypotonic bladder, prostate cancer, edema presents to the ER from the [name] with positive chest xray for pneumonia. He was in [place] 3 weeks ago and stayed in [place] as well. He felt as if he was getting a cold on his way home from [place] but OTC medications have not been helping. He has had associated sinus drainage, previous episode of confusion, vomiting, general weakness and dizziness. He is currently in no distress, afebrile, with normal vital signs and BP. Pt has not had any antibiotics.  [facility] reports hypoxic when laying flat and temp of 99.8 on arrival to there clinic.   Past Medical History  Diagnosis Date  . Peripheral neuropathy     Elevated by Dr. love 2010  . Essential hypertension   . Hyperlipidemia   . Essential tremor   . Obstructive uropathy   . Hypotonic bladder     L4-L5  . Prostate cancer 2005  . Edema    Past Surgical History  Procedure Laterality Date  . Skin cancer excision  1974    On chest wall  . Achilles tendon surgery  1995  . Colonoscopy      With polyp resection  . Total knee arthroplasty  2011  . Total knee arthroplasty Left 04/09/2013    Procedure: LEFT TOTAL KNEE ARTHROPLASTY;  Surgeon: Gearlean Alf, MD;  Location: [facility];  Service: Orthopedics;  Laterality: Left;   Family History  Problem Relation Age of Onset  . Hypertension Mother   . Heart failure Mother   . Diabetes  Mother    History  Substance Use Topics  . Smoking status: Never Smoker   . Smokeless tobacco: Not on file  . Alcohol Use: 0.6 oz/week    1 Glasses of wine per week    Review of Systems  10 Systems reviewed and are negative for acute change except as noted in the HPI.   Allergies  Simvastatin  Home Medications   Prior to Admission medications   Medication Sig Start Date End Date Taking? Authorizing Provider  aspirin 81 MG tablet Take 81 mg by mouth at bedtime.    Yes Historical Provider, MD  dutasteride (AVODART) 0.5 MG capsule Take 0.5 mg by mouth daily.   Yes Historical Provider, MD  ezetimibe (ZETIA) 10 MG tablet Take 10 mg by mouth 3 (three) times a week.    Yes Historical Provider, MD  folic acid (FOLVITE) Q000111Q MCG tablet Take 400 mcg by mouth daily.   Yes Historical Provider, MD  Multiple Vitamins-Minerals (CENTRUM SILVER PO) Take by mouth.   Yes Historical Provider, MD  multivitamin-lutein (OCUVITE-LUTEIN) CAPS capsule Take 1 capsule by mouth daily.   Yes Historical Provider, MD  OVER THE COUNTER MEDICATION Take 1 tablet by mouth daily. Focus Factor   Yes Historical Provider, MD  saw palmetto 160 MG capsule Take 160 mg by mouth 2 (two) times daily.   Yes Historical Provider, MD  sulfaSALAzine (AZULFIDINE) 500 MG tablet Take 2,000 mg by  mouth daily.    Yes Historical Provider, MD  tamsulosin (FLOMAX) 0.4 MG CAPS Take 0.4 mg by mouth daily.    Yes Historical Provider, MD   BP 115/74 mmHg  Pulse 91  Temp(Src) 98 F (36.7 C) (Oral)  SpO2 90% Physical Exam  Constitutional: He is oriented to person, place, and time. He appears well-developed and well-nourished. No distress.  HENT:  Head: Normocephalic and atraumatic.  Nose: Rhinorrhea present.  Mouth/Throat: Posterior oropharyngeal erythema (mild) present.  Eyes: Pupils are equal, round, and reactive to light.  Neck: Normal range of motion. Neck supple.  Cardiovascular: Normal rate and regular rhythm.   Pulmonary/Chest:  Effort normal. He has decreased breath sounds (mild in all lung fields). He has rales.  Mild cough during exam  Abdominal: Soft. There is no tenderness. There is no rebound and no guarding.  Neurological: He is alert and oriented to person, place, and time.  Skin: Skin is warm and dry.  Nursing note and vitals reviewed.   ED Course  Procedures (including critical care time) Labs Review Labs Reviewed  CBC WITH DIFFERENTIAL/PLATELET - Abnormal; Notable for the following:    WBC 23.6 (*)    Neutrophils Relative % 90 (*)    Lymphocytes Relative 5 (*)    Neutro Abs 21.2 (*)    Monocytes Absolute 1.2 (*)    All other components within normal limits  BASIC METABOLIC PANEL - Abnormal; Notable for the following:    Glucose, Bld 128 (*)    BUN 27 (*)    GFR calc non Af Amer 47 (*)    GFR calc Af Amer 54 (*)    All other components within normal limits  CULTURE, BLOOD (ROUTINE X 2)  CULTURE, BLOOD (ROUTINE X 2)  URINALYSIS, ROUTINE W REFLEX MICROSCOPIC  I-STAT CG4 LACTIC ACID, ED    Imaging Review Dg Chest 2 View  08/13/2014   CLINICAL DATA:  Productive cough.  EXAM: CHEST  2 VIEW  COMPARISON:  PA and lateral chest 08/13/2014 7:44 p.m. and PA and lateral chest 09/13/2012.  FINDINGS: There is a left basilar airspace disease most consistent with pneumonia. The right lung is clear. Heart size is mildly enlarged. No pneumothorax or pleural effusion.  IMPRESSION: Left lower lobe airspace disease most consistent with pneumonia. Recommend followup films to clearing.   Electronically Signed   By: Inge Rise M.D.   On: 08/13/2014 21:36     EKG Interpretation None      MDM   Final diagnoses:  Pneumonia   Patient has a left lower lobe pneumonia and a white count of of 23.6. Otherwise he has a negative lactic acid. Afebrile and hemodynamically stable here in the ED.   Medications  0.9 %  sodium chloride infusion ( Intravenous New Bag/Given 08/13/14 2208)  azithromycin (ZITHROMAX) 500  mg in dextrose 5 % 250 mL IVPB (not administered)  cefTRIAXone (ROCEPHIN) 1 g in dextrose 5 % 50 mL IVPB (1 g Intravenous New Bag/Given 08/13/14 2242)   [facility] has agreed to admit patient. [facility], admit, inpatient, medsurg.  Filed Vitals:   08/13/14 2055  BP:   Pulse:   Temp: 98 F (36.7 C)     Linus Mako, PA-C 08/13/14 2339  Leota Jacobsen, MD 08/14/14 [phone]

## 2014-08-14 DIAGNOSIS — K529 Noninfective gastroenteritis and colitis, unspecified: Secondary | ICD-10-CM | POA: Diagnosis present

## 2014-08-14 DIAGNOSIS — E785 Hyperlipidemia, unspecified: Secondary | ICD-10-CM

## 2014-08-14 DIAGNOSIS — A419 Sepsis, unspecified organism: Secondary | ICD-10-CM | POA: Diagnosis present

## 2014-08-14 DIAGNOSIS — I10 Essential (primary) hypertension: Secondary | ICD-10-CM

## 2014-08-14 DIAGNOSIS — J189 Pneumonia, unspecified organism: Principal | ICD-10-CM

## 2014-08-14 LAB — COMPREHENSIVE METABOLIC PANEL
ALT: 20 U/L (ref 0–53)
AST: 39 U/L — ABNORMAL HIGH (ref 0–37)
Albumin: 3.3 g/dL — ABNORMAL LOW (ref 3.5–5.2)
Alkaline Phosphatase: 36 U/L — ABNORMAL LOW (ref 39–117)
Anion gap: 10 (ref 5–15)
BUN: 27 mg/dL — ABNORMAL HIGH (ref 6–23)
CO2: 24 mmol/L (ref 19–32)
Calcium: 8.3 mg/dL — ABNORMAL LOW (ref 8.4–10.5)
Chloride: 105 mmol/L (ref 96–112)
Creatinine, Ser: 1.26 mg/dL (ref 0.50–1.35)
GFR calc Af Amer: 59 mL/min — ABNORMAL LOW (ref >90)
GFR calc non Af Amer: 51 mL/min — ABNORMAL LOW (ref >90)
Glucose, Bld: 102 mg/dL — ABNORMAL HIGH (ref 70–99)
Potassium: 3.7 mmol/L (ref 3.5–5.1)
Sodium: 139 mmol/L (ref 135–145)
Total Bilirubin: 1.1 mg/dL (ref 0.3–1.2)
Total Protein: 5.9 g/dL — ABNORMAL LOW (ref 6.0–8.3)

## 2014-08-14 LAB — URINALYSIS, ROUTINE W REFLEX MICROSCOPIC
Bilirubin Urine: NEGATIVE
Glucose, UA: NEGATIVE mg/dL
Hgb urine dipstick: NEGATIVE
Ketones, ur: NEGATIVE mg/dL
Leukocytes, UA: NEGATIVE
Nitrite: NEGATIVE
Protein, ur: NEGATIVE mg/dL
Specific Gravity, Urine: 1.023 (ref 1.005–1.030)
Urobilinogen, UA: 0.2 mg/dL (ref 0.0–1.0)
pH: 5.5 (ref 5.0–8.0)

## 2014-08-14 LAB — CBC WITH DIFFERENTIAL/PLATELET
Basophils Absolute: 0 10*3/uL (ref 0.0–0.1)
Basophils Relative: 0 % (ref 0–1)
Eosinophils Absolute: 0.1 10*3/uL (ref 0.0–0.7)
Eosinophils Relative: 0 % (ref 0–5)
HCT: 38.6 % — ABNORMAL LOW (ref 39.0–52.0)
Hemoglobin: 12.8 g/dL — ABNORMAL LOW (ref 13.0–17.0)
Lymphocytes Relative: 6 % — ABNORMAL LOW (ref 12–46)
Lymphs Abs: 1.1 10*3/uL (ref 0.7–4.0)
MCH: 31.5 pg (ref 26.0–34.0)
MCHC: 33.2 g/dL (ref 30.0–36.0)
MCV: 95.1 fL (ref 78.0–100.0)
Monocytes Absolute: 1 10*3/uL (ref 0.1–1.0)
Monocytes Relative: 6 % (ref 3–12)
Neutro Abs: 16.1 10*3/uL — ABNORMAL HIGH (ref 1.7–7.7)
Neutrophils Relative %: 88 % — ABNORMAL HIGH (ref 43–77)
Platelets: 140 10*3/uL — ABNORMAL LOW (ref 150–400)
RBC: 4.06 MIL/uL — ABNORMAL LOW (ref 4.22–5.81)
RDW: 13.8 % (ref 11.5–15.5)
WBC: 18.2 10*3/uL — ABNORMAL HIGH (ref 4.0–10.5)

## 2014-08-14 LAB — EXPECTORATED SPUTUM ASSESSMENT W GRAM STAIN, RFLX TO RESP C

## 2014-08-14 LAB — INFLUENZA PANEL BY PCR (TYPE A & B)
H1N1 flu by pcr: NOT DETECTED
Influenza A By PCR: NEGATIVE
Influenza B By PCR: NEGATIVE

## 2014-08-14 LAB — STREP PNEUMONIAE URINARY ANTIGEN: Strep Pneumo Urinary Antigen: NEGATIVE

## 2014-08-14 LAB — EXPECTORATED SPUTUM ASSESSMENT W REFEX TO RESP CULTURE

## 2014-08-14 MED ORDER — DUTASTERIDE 0.5 MG PO CAPS
0.5000 mg | ORAL_CAPSULE | Freq: Every day | ORAL | Status: DC
Start: 2014-08-14 — End: 2014-08-15
  Administered 2014-08-14 – 2014-08-15 (×2): 0.5 mg via ORAL
  Filled 2014-08-14 (×3): qty 1

## 2014-08-14 MED ORDER — SODIUM CHLORIDE 0.9 % IV BOLUS (SEPSIS)
500.0000 mL | Freq: Once | INTRAVENOUS | Status: AC
Start: 2014-08-14 — End: 2014-08-14
  Administered 2014-08-14: 500 mL via INTRAVENOUS

## 2014-08-14 MED ORDER — SALINE SPRAY 0.65 % NA SOLN
1.0000 | NASAL | Status: DC | PRN
  Filled 2014-08-14: qty 44

## 2014-08-14 MED ORDER — FOLIC ACID 0.5 MG HALF TAB
500.0000 ug | ORAL_TABLET | Freq: Every day | ORAL | Status: DC
  Administered 2014-08-14 – 2014-08-15 (×2): 0.5 mg via ORAL
  Filled 2014-08-14 (×3): qty 1

## 2014-08-14 MED ORDER — DEXTROSE 5 % IV SOLN
1.0000 g | INTRAVENOUS | Status: DC
  Administered 2014-08-14: 1 g via INTRAVENOUS
  Filled 2014-08-14 (×2): qty 10

## 2014-08-14 MED ORDER — SULFASALAZINE 500 MG PO TABS
2000.0000 mg | ORAL_TABLET | Freq: Every day | ORAL | Status: DC
Start: 2014-08-14 — End: 2014-08-15
  Administered 2014-08-14 – 2014-08-15 (×2): 2000 mg via ORAL
  Filled 2014-08-14 (×3): qty 4

## 2014-08-14 MED ORDER — SODIUM CHLORIDE 0.9 % IV SOLN
INTRAVENOUS | Status: DC
  Administered 2014-08-14 – 2014-08-15 (×4): via INTRAVENOUS

## 2014-08-14 MED ORDER — HYDROCOD POLST-CHLORPHEN POLST 10-8 MG/5ML PO LQCR: 5.0000 mL | Freq: Once | ORAL | Status: DC

## 2014-08-14 MED ORDER — ASPIRIN 81 MG PO CHEW
81.0000 mg | CHEWABLE_TABLET | Freq: Every day | ORAL | Status: DC
  Administered 2014-08-14 (×2): 81 mg via ORAL
  Filled 2014-08-14 (×2): qty 1

## 2014-08-14 MED ORDER — DM-GUAIFENESIN ER 30-600 MG PO TB12
2.0000 | ORAL_TABLET | Freq: Two times a day (BID) | ORAL | Status: DC
  Administered 2014-08-14 – 2014-08-15 (×4): 2 via ORAL
  Filled 2014-08-14 (×4): qty 2

## 2014-08-14 MED ORDER — TAMSULOSIN HCL 0.4 MG PO CAPS
0.4000 mg | ORAL_CAPSULE | Freq: Every day | ORAL | Status: DC
  Administered 2014-08-14 – 2014-08-15 (×2): 0.4 mg via ORAL
  Filled 2014-08-14 (×2): qty 1

## 2014-08-14 MED ORDER — ZOLPIDEM TARTRATE 5 MG PO TABS: 5.0000 mg | ORAL_TABLET | Freq: Once | ORAL | Status: DC

## 2014-08-14 MED ORDER — EZETIMIBE 10 MG PO TABS
10.0000 mg | ORAL_TABLET | ORAL | Status: DC
  Administered 2014-08-14: 10 mg via ORAL
  Filled 2014-08-14: qty 1

## 2014-08-14 MED ORDER — AZITHROMYCIN 500 MG IV SOLR
500.0000 mg | INTRAVENOUS | Status: DC
  Administered 2014-08-14: 500 mg via INTRAVENOUS
  Filled 2014-08-14: qty 500

## 2014-08-14 MED ORDER — ENOXAPARIN SODIUM 40 MG/0.4ML ~~LOC~~ SOLN
40.0000 mg | SUBCUTANEOUS | Status: DC
  Administered 2014-08-14 – 2014-08-15 (×2): 40 mg via SUBCUTANEOUS
  Filled 2014-08-14 (×2): qty 0.4

## 2014-08-14 NOTE — Care Management Note (Signed)
CARE MANAGEMENT NOTE 08/14/2014  Patient:  [name] [name], [name] [name]   Account Number:  [id number]  Date Initiated:  08/14/2014  Documentation initiated by:  Marney Doctor  Subjective/Objective Assessment:   81 yo admitted with CAP.  Past medical history of peripheral neuropathy, hypertension, dyslipidemia, BPH.     Action/Plan:   From home with spouse   Anticipated DC Date:  08/16/2014   Anticipated DC Plan:  HOME/SELF CARE         Choice offered to / List presented to:             Status of service:  In process, will continue to follow Medicare Important Message given?   (If response is "NO", the following Medicare IM given date fields will be blank) Date Medicare IM given:   Medicare IM given by:   Date Additional Medicare IM given:   Additional Medicare IM given by:    Discharge Disposition:    Per UR Regulation:  Reviewed for med. necessity/level of care/duration of stay  If discussed at [place] of Stay Meetings, dates discussed:    Comments:  08/14/14 Marney Doctor RN,BSN,NCM [id number] Chart reviewed and CM following for DC planning.

## 2014-08-14 NOTE — H&P (Addendum)
[facility] History and Physical  Patient: [name] [name]  MRN: [id number]  DOB: 09/03/1927  DOS: the patient was seen and examined on 08/14/2014 PCP: [name], MD  Chief Complaint: Cough  HPI: [name] [name] is a 81 y.o. male with Past medical history of peripheral neuropathy, hypertension, dyslipidemia, BPH. The patient presented with complaints of an episode of dizziness lightheadedness with nausea and vomiting last night progressively worsening shortness of breath. Patient mentions that in February beginning he was at [place] and has just returned from there. He denies any exposure to bat denies any exposure to any chemicals or bugs.  Patient initially started with a runny nose later on started with cough with expectoration of yellowish sputum without any blood. Denies any weight loss or night sweats. Denies any rash or lumps or bumps anywhere. Does not have any other sick contact.   The patient is coming from home And at his baseline independent for most of his ADL.  Review of Systems: as mentioned in the history of present illness.  A Comprehensive review of the other systems is negative.  Past Medical History  Diagnosis Date  . Peripheral neuropathy     Elevated by Dr. love 2010  . Essential hypertension   . Hyperlipidemia   . Essential tremor   . Obstructive uropathy   . Hypotonic bladder     L4-L5  . Prostate cancer 2005  . Edema    Past Surgical History  Procedure Laterality Date  . Skin cancer excision  1974    On chest wall  . Achilles tendon surgery  1995  . Colonoscopy      With polyp resection  . Total knee arthroplasty  2011  . Total knee arthroplasty Left 04/09/2013    Procedure: LEFT TOTAL KNEE ARTHROPLASTY;  Surgeon: Gearlean Alf, MD;  Location: [facility];  Service: Orthopedics;  Laterality: Left;   Social History:  reports that he has never smoked. He does not have any smokeless tobacco history on file. He reports that he drinks about  0.6 oz of alcohol per week. He reports that he does not use illicit drugs.  Allergies  Allergen Reactions  . Simvastatin     "caused neuropathy pain"    Family History  Problem Relation Age of Onset  . Hypertension Mother   . Heart failure Mother   . Diabetes Mother     Prior to Admission medications   Medication Sig Start Date End Date Taking? Authorizing Provider  aspirin 81 MG tablet Take 81 mg by mouth at bedtime.    Yes Historical Provider, MD  dutasteride (AVODART) 0.5 MG capsule Take 0.5 mg by mouth daily.   Yes Historical Provider, MD  ezetimibe (ZETIA) 10 MG tablet Take 10 mg by mouth 3 (three) times a week.    Yes Historical Provider, MD  folic acid (FOLVITE) Q000111Q MCG tablet Take 400 mcg by mouth daily.   Yes Historical Provider, MD  Multiple Vitamins-Minerals (CENTRUM SILVER PO) Take by mouth.   Yes Historical Provider, MD  multivitamin-lutein (OCUVITE-LUTEIN) CAPS capsule Take 1 capsule by mouth daily.   Yes Historical Provider, MD  OVER THE COUNTER MEDICATION Take 1 tablet by mouth daily. Focus Factor   Yes Historical Provider, MD  saw palmetto 160 MG capsule Take 160 mg by mouth 2 (two) times daily.   Yes Historical Provider, MD  sulfaSALAzine (AZULFIDINE) 500 MG tablet Take 2,000 mg by mouth daily.    Yes Historical Provider, MD  tamsulosin (FLOMAX) 0.4 MG  CAPS Take 0.4 mg by mouth daily.    Yes Historical Provider, MD    Physical Exam: Filed Vitals:   08/13/14 2055 08/13/14 2354 08/14/14 0132 08/14/14 0153  BP:  93/58 100/64 115/69  Pulse:  86 83 82  Temp: 98 F (36.7 C) 98.9 F (37.2 C) 99.1 F (37.3 C) 98 F (36.7 C)  TempSrc: Oral Oral Oral Oral  Resp:  16  18  Height:    5\' 9"  (1.753 m)  Weight:    82.6 kg (182 lb 1.6 oz)  SpO2:  92% 94% 94%    General: Alert, Awake and Oriented to Time, Place and Person. Appear in mild distress Eyes: PERRL ENT: Oral Mucosa clear moist. Neck: no JVD Cardiovascular: S1 and S2 Present, no Murmur, Peripheral Pulses  Present Respiratory: Bilateral Air entry equal and Decreased, Clear to Auscultation, nCrackles, no wheezes Abdomen: Bowel Sound present, Soft and non tender Skin: no Rash Extremities: Left more than right Pedal edema, no calf tenderness Neurologic: Grossly no focal neuro deficit.  Labs on Admission:  CBC:  Recent Labs Lab 08/13/14 2142  WBC 23.6*  NEUTROABS 21.2*  HGB 13.8  HCT 40.6  MCV 93.5  PLT 151    CMP     Component Value Date/Time   NA 137 08/13/2014 2142   K 3.9 08/13/2014 2142   CL 103 08/13/2014 2142   CO2 25 08/13/2014 2142   GLUCOSE 128* 08/13/2014 2142   BUN 27* 08/13/2014 2142   CREATININE 1.34 08/13/2014 2142   CALCIUM 8.9 08/13/2014 2142   PROT 6.6 04/04/2013 1540   ALBUMIN 3.7 04/04/2013 1540   AST 32 04/04/2013 1540   ALT 19 04/04/2013 1540   ALKPHOS 40 04/04/2013 1540   BILITOT 0.6 04/04/2013 1540   GFRNONAA 47* 08/13/2014 2142   GFRAA 54* 08/13/2014 2142    No results for input(s): LIPASE, AMYLASE in the last 168 hours.  No results for input(s): CKTOTAL, CKMB, CKMBINDEX, TROPONINI in the last 168 hours. BNP (last 3 results) No results for input(s): BNP in the last 8760 hours.  ProBNP (last 3 results) No results for input(s): PROBNP in the last 8760 hours.   Radiological Exams on Admission: Dg Chest 2 View  08/13/2014   CLINICAL DATA:  Productive cough.  EXAM: CHEST  2 VIEW  COMPARISON:  PA and lateral chest 08/13/2014 7:44 p.m. and PA and lateral chest 09/13/2012.  FINDINGS: There is a left basilar airspace disease most consistent with pneumonia. The right lung is clear. Heart size is mildly enlarged. No pneumothorax or pleural effusion.  IMPRESSION: Left lower lobe airspace disease most consistent with pneumonia. Recommend followup films to clearing.   Electronically Signed   By: Inge Rise M.D.   On: 08/13/2014 21:36    Assessment/Plan Principal Problem:   CAP (community acquired pneumonia) Active Problems:   HTN  (hypertension)   Hyperlipidemia   OA (osteoarthritis) of knee   Colitis   1. CAP (community acquired pneumonia)  The patient is presenting with numbness of cough shortness of breath. He was mildly hypertensive in the ER that may be amenable 65. With this the patient will be admitted in hospital for community-acquired pneumonia. He will be treated with ceftriaxone azithromycin. Follow cultures. If there is no improvement patient will require further workup with imaging.   2. Hypertension. Holding antihypertensive medications.  3. BPH. Continuing home medications.  4. Colitis. Continuing sulfasalazine  Advance goals of care discussion: Full code   DVT Prophylaxis: subcutaneous Heparinn Nutrition:  Cardiac diet  Family Communication: Wife  present at bedside, opportunity was given to ask question and all questions were answered satisfactorily at the time of interview. Disposition: Admitted to inpatient in telemetry unit.  Author: Berle Mull, MD [facility] Pager: [phone] 08/14/2014, 2:15 AM    If 7PM-7AM, please contact night-coverage [email or web address] Password [email or web address]

## 2014-08-14 NOTE — Progress Notes (Signed)
[facility] PROGRESS NOTE  [name] [id number] DOB: [birth year] DOA: 08/13/2014 PCP: [name], MD  Assessment/Plan: 1. CAP 1. Leukocytosis improving nicely overnight 2. Tolerating azithromycin and rocephin 3. Pt reports continued secretions. Will add flutter valve 4. If patient continues to improve, then would consider transitioning IV abx to PO after 24hrs of IV abx 2. HTN 1. Stable and controlled 2. Cont current regimen 3. BPH 1. Seems stable 2. Continue flomax per home regimen 4. Hx of colitis 1. Stable 2. Would continue sulfasalazine per home regimen 5. DVT prophylaxis 1. Lovenox subQ  Code Status: Full Family Communication: Pt in room  Disposition Plan: Pending   Consultants:    Procedures:    Antibiotics:  Azithromycin 2/17>>>  Rocephin 2/17>>>  HPI/Subjective: Feels better today. No acute events noted overnight  Objective: Filed Vitals:   08/14/14 0132 08/14/14 0153 08/14/14 0611 08/14/14 1356  BP: 100/64 115/69 109/72 127/74  Pulse: 83 82 84 87  Temp: 99.1 F (37.3 C) 98 F (36.7 C) 98.8 F (37.1 C) 98.2 F (36.8 C)  TempSrc: Oral Oral Oral Oral  Resp:  18 18 20   Height:  5\' 9"  (1.753 m)    Weight:  82.6 kg (182 lb 1.6 oz)    SpO2: 94% 94% 94% 92%    Intake/Output Summary (Last 24 hours) at 08/14/14 1534 Last data filed at 08/14/14 1111  Gross per 24 hour  Intake 1068.33 ml  Output    100 ml  Net 968.33 ml   Filed Weights   08/14/14 0153  Weight: 82.6 kg (182 lb 1.6 oz)    Exam:   General:  Awake, in nad  Cardiovascular: regular, s1, s2  Respiratory: normal resp effort, no wheezing  Abdomen: soft,nondistended  Musculoskeletal: perfused, no clubbing   Data Reviewed: Basic Metabolic Panel:  Recent Labs Lab 08/13/14 2142 08/14/14 0526  NA 137 139  [name] 3.9 3.7  CL 103 105  CO2 25 24  GLUCOSE 128* 102*  BUN 27* 27*  CREATININE 1.34 1.26  CALCIUM 8.9 8.3*   Liver Function Tests:  Recent  Labs Lab 08/14/14 0526  AST 39*  ALT 20  ALKPHOS 36*  BILITOT 1.1  PROT 5.9*  ALBUMIN 3.3*   No results for input(s): LIPASE, AMYLASE in the last 168 hours. No results for input(s): AMMONIA in the last 168 hours. CBC:  Recent Labs Lab 08/13/14 2142 08/14/14 0526  WBC 23.6* 18.2*  NEUTROABS 21.2* 16.1*  HGB 13.8 12.8*  HCT 40.6 38.6*  MCV 93.5 95.1  PLT 151 140*   Cardiac Enzymes: No results for input(s): CKTOTAL, CKMB, CKMBINDEX, TROPONINI in the last 168 hours. BNP (last 3 results) No results for input(s): BNP in the last 8760 hours.  ProBNP (last 3 results) No results for input(s): PROBNP in the last 8760 hours.  CBG: No results for input(s): GLUCAP in the last 168 hours.  Recent Results (from the past 240 hour(s))  Culture, sputum-assessment     Status: None   Collection Time: 08/14/14  6:05 AM  Result Value Ref Range Status   Specimen Description SPUTUM  Final   Special Requests NONE  Final   Sputum evaluation   Final    THIS SPECIMEN IS ACCEPTABLE. RESPIRATORY CULTURE REPORT TO FOLLOW.   Report Status 08/14/2014 FINAL  Final     Studies: Dg Chest 2 View  08/13/2014   CLINICAL DATA:  Productive cough.  EXAM: CHEST  2 VIEW  COMPARISON:  PA and lateral chest 08/13/2014 7:44  p.m. and PA and lateral chest 09/13/2012.  FINDINGS: There is a left basilar airspace disease most consistent with pneumonia. The right lung is clear. Heart size is mildly enlarged. No pneumothorax or pleural effusion.  IMPRESSION: Left lower lobe airspace disease most consistent with pneumonia. Recommend followup films to clearing.   Electronically Signed   By: Inge Rise M.D.   On: 08/13/2014 21:36    Scheduled Meds: . aspirin  81 mg Oral QHS  . azithromycin  500 mg Intravenous Q24H  . cefTRIAXone (ROCEPHIN)  IV  1 g Intravenous Q24H  . dextromethorphan-guaiFENesin  2 tablet Oral BID  . dutasteride  0.5 mg Oral Daily  . enoxaparin (LOVENOX) injection  40 mg Subcutaneous Q24H  .  ezetimibe  10 mg Oral Once per day on Mon Wed Fri  . folic acid  [id number] mcg Oral Daily  . sulfaSALAzine  2,000 mg Oral Daily  . tamsulosin  0.4 mg Oral Daily  . zolpidem  5 mg Oral Once   Continuous Infusions: . sodium chloride 100 mL/hr at 08/14/14 0254    Principal Problem:   CAP (community acquired pneumonia) Active Problems:   HTN (hypertension)   Hyperlipidemia   OA (osteoarthritis) of knee   Colitis  [name] [name]  [facility] Pager [phone]. If 7PM-7AM, please contact night-coverage at [email or web address], password [email or web address] 08/14/2014, 3:34 PM  LOS: 1 day

## 2014-08-14 NOTE — ED Notes (Signed)
Attempted report 

## 2014-08-14 NOTE — Progress Notes (Signed)
PT demomstrated verbal and hands on understanding of Flutter device.

## 2014-08-15 DIAGNOSIS — N400 Enlarged prostate without lower urinary tract symptoms: Secondary | ICD-10-CM

## 2014-08-15 LAB — CBC
HCT: 36.9 % — ABNORMAL LOW (ref 39.0–52.0)
Hemoglobin: 12.2 g/dL — ABNORMAL LOW (ref 13.0–17.0)
MCH: 31.6 pg (ref 26.0–34.0)
MCHC: 33.1 g/dL (ref 30.0–36.0)
MCV: 95.6 fL (ref 78.0–100.0)
Platelets: 135 10*3/uL — ABNORMAL LOW (ref 150–400)
RBC: 3.86 MIL/uL — ABNORMAL LOW (ref 4.22–5.81)
RDW: 13.8 % (ref 11.5–15.5)
WBC: 11.9 10*3/uL — ABNORMAL HIGH (ref 4.0–10.5)

## 2014-08-15 LAB — BASIC METABOLIC PANEL
Anion gap: 6 (ref 5–15)
BUN: 20 mg/dL (ref 6–23)
CO2: 25 mmol/L (ref 19–32)
Calcium: 8.2 mg/dL — ABNORMAL LOW (ref 8.4–10.5)
Chloride: 109 mmol/L (ref 96–112)
Creatinine, Ser: 1.24 mg/dL (ref 0.50–1.35)
GFR calc Af Amer: 60 mL/min — ABNORMAL LOW (ref >90)
GFR calc non Af Amer: 52 mL/min — ABNORMAL LOW (ref >90)
Glucose, Bld: 107 mg/dL — ABNORMAL HIGH (ref 70–99)
Potassium: 4.1 mmol/L (ref 3.5–5.1)
Sodium: 140 mmol/L (ref 135–145)

## 2014-08-15 LAB — LEGIONELLA ANTIGEN, URINE

## 2014-08-15 MED ORDER — CEFUROXIME AXETIL 250 MG PO TABS: 250.0000 mg | ORAL_TABLET | Freq: Two times a day (BID) | ORAL | Status: DC

## 2014-08-15 MED ORDER — AZITHROMYCIN 250 MG PO TABS: ORAL_TABLET | ORAL | Status: DC

## 2014-08-15 NOTE — Care Management Note (Signed)
    Page 1 of 1   08/15/2014     11:11:00 AM CARE MANAGEMENT NOTE 08/15/2014  Patient:  [name], [name]   Account Number:  [id number]  Date Initiated:  08/14/2014  Documentation initiated by:  Marney Doctor  Subjective/Objective Assessment:   81 yo admitted with CAP.  Past medical history of peripheral neuropathy, hypertension, dyslipidemia, BPH.     Action/Plan:   From home with spouse   Anticipated DC Date:  08/15/2014   Anticipated DC Plan:  [place]  CM consult      Choice offered to / List presented to:             Status of service:  Completed, signed off Medicare Important Message given?   (If response is "NO", the following Medicare IM given date fields will be blank) Date Medicare IM given:   Medicare IM given by:   Date Additional Medicare IM given:   Additional Medicare IM given by:    Discharge Disposition:  HOME/SELF CARE  Per UR Regulation:  Reviewed for med. necessity/level of care/duration of stay  If discussed at [place] of Stay Meetings, dates discussed:    Comments:  08/15/14 Dessa Phi RN BSN NCM [phone] d/c home no needs or orders.  08/14/14 Marney Doctor RN,BSN,NCM [phone] Chart reviewed and CM following for DC planning.

## 2014-08-15 NOTE — Progress Notes (Signed)
Report received from Briana B. No change from initial pm report. Will continue to monitor and follow the POC. 

## 2014-08-15 NOTE — Discharge Summary (Signed)
Physician Discharge Summary  [name] [name] [id number] DOB: [birth year] DOA: 08/13/2014  PCP: [name] Pac, MD  Admit date: 08/13/2014 Discharge date: 08/15/2014  Time spent: 25 minutes  Recommendations for Outpatient Follow-up:  1. Follow up with PCP in 1-2 weeks  Discharge Diagnoses:  Principal Problem:   CAP (community acquired pneumonia) Active Problems:   HTN (hypertension)   Hyperlipidemia   OA (osteoarthritis) of knee   Colitis   BPH (benign prostatic hyperplasia)   Discharge Condition: Improved  Diet recommendation: Heart healthy  Filed Weights   08/14/14 0153 08/15/14 0517  Weight: 82.6 kg (182 lb 1.6 oz) 83.7 kg (184 lb 8.4 oz)    History of present illness:  Please review h and p from 2/17 for details. Briefly pt presents with increased cough and sputum production, found to have a presenting WBC in the ED of 23.6k. The patient was admitted for further work up.  Hospital Course:  1. CAP 1. Leukocytosis improved nicely during this admission 2. Tolerated azithromycin and rocephin, to complete course with PO azithromycin and ceftin on discharge 3. Pt reported continued secretions. Added flutter valve 2. HTN 1. Remained stable and controlled 2. Cont current regimen 3. BPH 1. Seems stable 2. Continued flomax per home regimen 4. Hx of ulcerative colitis 1. Remained stable 2. Continued sulfasalazine per home regimen 5. DVT prophylaxis 1. Lovenox subQ while inpatient  Consultations:  None  Discharge Exam: Filed Vitals:   08/14/14 1356 08/14/14 2041 08/15/14 0517 08/15/14 0539  BP: 127/74 125/71  122/77  Pulse: 87 88  76  Temp: 98.2 F (36.8 C) 99.4 F (37.4 C)  98.7 F (37.1 C)  TempSrc: Oral Oral  Oral  Resp: 20 20  18   Height:      Weight:   83.7 kg (184 lb 8.4 oz)   SpO2: 92% 95%  95%    General: awake, in nad Cardiovascular: regular, s1, s2 Respiratory: normal resp effort, no wheezing  Discharge Instructions     Medication List     TAKE these medications        aspirin 81 MG tablet  Take 81 mg by mouth at bedtime.     azithromycin 250 MG tablet  Commonly known as:  ZITHROMAX  1 tab po qday x 4 days, zero refills     cefUROXime 250 MG tablet  Commonly known as:  CEFTIN  Take 1 tablet (250 mg total) by mouth 2 (two) times daily with a meal.     dutasteride 0.5 MG capsule  Commonly known as:  AVODART  Take 0.5 mg by mouth daily.     ezetimibe 10 MG tablet  Commonly known as:  ZETIA  Take 10 mg by mouth 3 (three) times a week.     folic acid Q000111Q MCG tablet  Commonly known as:  FOLVITE  Take 400 mcg by mouth daily.     multivitamin-lutein Caps capsule  Take 1 capsule by mouth daily.     CENTRUM SILVER PO  Take by mouth.     OVER THE COUNTER MEDICATION  Take 1 tablet by mouth daily. Focus Factor     saw palmetto 160 MG capsule  Take 160 mg by mouth 2 (two) times daily.     sulfaSALAzine 500 MG tablet  Commonly known as:  AZULFIDINE  Take 2,000 mg by mouth daily.     tamsulosin 0.4 MG Caps capsule  Commonly known as:  FLOMAX  Take 0.4 mg by mouth daily.  Allergies  Allergen Reactions  . Simvastatin     "caused neuropathy pain"   Follow-up Information    Follow up with [name] Pac, MD. Schedule an appointment as soon as possible for a visit in 1 week.   Specialty:  Family Medicine   Contact information:   [place] [place] [address] [phone]        The results of significant diagnostics from this hospitalization (including imaging, microbiology, ancillary and laboratory) are listed below for reference.    Significant Diagnostic Studies: Dg Chest 2 View  08/13/2014   CLINICAL DATA:  Productive cough.  EXAM: CHEST  2 VIEW  COMPARISON:  PA and lateral chest 08/13/2014 7:44 p.m. and PA and lateral chest 09/13/2012.  FINDINGS: There is a left basilar airspace disease most consistent with pneumonia. The right lung is clear. Heart size is mildly enlarged. No  pneumothorax or pleural effusion.  IMPRESSION: Left lower lobe airspace disease most consistent with pneumonia. Recommend followup films to clearing.   Electronically Signed   By: [name] Rise M.D.   On: 08/13/2014 21:36    Microbiology: Recent Results (from the past 240 hour(s))  Culture, blood (routine x 2)     Status: None (Preliminary result)   Collection Time: 08/13/14 11:38 PM  Result Value Ref Range Status   Specimen Description BLOOD LEFT HAND  Final   Special Requests BOTTLES DRAWN AEROBIC AND ANAEROBIC 4CC  Final   Culture   Final           BLOOD CULTURE RECEIVED NO GROWTH TO DATE CULTURE WILL BE HELD FOR 5 DAYS BEFORE ISSUING A FINAL NEGATIVE REPORT Note: Culture results may be compromised due to an inadequate volume of blood received in culture bottles. Performed at [facility]    Report Status PENDING  Incomplete  Culture, blood (routine x 2) Call MD if unable to obtain prior to antibiotics being given     Status: None (Preliminary result)   Collection Time: 08/14/14 12:04 AM  Result Value Ref Range Status   Specimen Description BLOOD RIGHT WRIST  Final   Special Requests BOTTLES DRAWN AEROBIC AND ANAEROBIC 5CC  Final   Culture   Final           BLOOD CULTURE RECEIVED NO GROWTH TO DATE CULTURE WILL BE HELD FOR 5 DAYS BEFORE ISSUING A FINAL NEGATIVE REPORT Note: Culture results may be compromised due to an inadequate volume of blood received in culture bottles. Performed at [facility]    Report Status PENDING  Incomplete  Culture, sputum-assessment     Status: None   Collection Time: 08/14/14  6:05 AM  Result Value Ref Range Status   Specimen Description SPUTUM  Final   Special Requests NONE  Final   Sputum evaluation   Final    THIS SPECIMEN IS ACCEPTABLE. RESPIRATORY CULTURE REPORT TO FOLLOW.   Report Status 08/14/2014 FINAL  Final  Culture, respiratory (NON-Expectorated)     Status: None (Preliminary result)   Collection Time: 08/14/14  6:05  AM  Result Value Ref Range Status   Specimen Description SPUTUM  Final   Special Requests NONE  Final   Gram Stain   Final    NO WBC SEEN FEW SQUAMOUS EPITHELIAL CELLS PRESENT MODERATE GRAM NEGATIVE RODS Performed at [facility]    Culture PENDING  Incomplete   Report Status PENDING  Incomplete     Labs: Basic Metabolic Panel:  Recent Labs Lab 08/13/14 2142 08/14/14 0526  08/15/14 0434  NA 137 139 140  K 3.9 3.7 4.1  CL 103 105 109  CO2 25 24 25   GLUCOSE 128* 102* 107*  BUN 27* 27* 20  CREATININE 1.34 1.26 1.24  CALCIUM 8.9 8.3* 8.2*   Liver Function Tests:  Recent Labs Lab 08/14/14 0526  AST 39*  ALT 20  ALKPHOS 36*  BILITOT 1.1  PROT 5.9*  ALBUMIN 3.3*   No results for input(s): LIPASE, AMYLASE in the last 168 hours. No results for input(s): AMMONIA in the last 168 hours. CBC:  Recent Labs Lab 08/13/14 2142 08/14/14 0526 08/15/14 0434  WBC 23.6* 18.2* 11.9*  NEUTROABS 21.2* 16.1*  --   HGB 13.8 12.8* 12.2*  HCT 40.6 38.6* 36.9*  MCV 93.5 95.1 95.6  PLT 151 140* 135*   Cardiac Enzymes: No results for input(s): CKTOTAL, CKMB, CKMBINDEX, TROPONINI in the last 168 hours. BNP: BNP (last 3 results) No results for input(s): BNP in the last 8760 hours.  ProBNP (last 3 results) No results for input(s): PROBNP in the last 8760 hours.  CBG: No results for input(s): GLUCAP in the last 168 hours.  Signed:  CHIU, STEPHEN K  [facility] 08/15/2014, 9:57 AM

## 2014-08-15 NOTE — Progress Notes (Signed)
RX given tp pt

## 2014-08-16 LAB — CULTURE, RESPIRATORY: Culture: NORMAL

## 2014-08-16 LAB — CULTURE, RESPIRATORY W GRAM STAIN: Gram Stain: NONE SEEN

## 2014-08-20 LAB — CULTURE, BLOOD (ROUTINE X 2)
Culture: NO GROWTH
Culture: NO GROWTH

## 2014-08-21 LAB — HIV ANTIBODY (ROUTINE TESTING W REFLEX): HIV Screen 4th Generation wRfx: NONREACTIVE

## 2014-12-11 ENCOUNTER — Encounter: Payer: Self-pay | Admitting: Cardiology

## 2014-12-16 ENCOUNTER — Telehealth: Payer: Self-pay | Admitting: Cardiology

## 2014-12-16 NOTE — Telephone Encounter (Signed)
Pt called in stating that he will be on vacation for most of September and would like to get an appt scheduled in July or August. Please call  Thanks

## 2014-12-17 NOTE — Telephone Encounter (Signed)
Returned call to patient no answer.Left message on personal voice mail appointment scheduled with Dr.Jordan 02/18/15 at 11:15 am.

## 2015-02-12 ENCOUNTER — Ambulatory Visit: Payer: Medicare HMO | Admitting: Cardiology

## 2015-02-18 ENCOUNTER — Ambulatory Visit: Payer: Medicare HMO | Admitting: Cardiology

## 2015-03-13 ENCOUNTER — Ambulatory Visit: Payer: Medicare HMO | Admitting: Cardiology

## 2015-04-02 DIAGNOSIS — L219 Seborrheic dermatitis, unspecified: Secondary | ICD-10-CM | POA: Diagnosis not present

## 2015-04-02 DIAGNOSIS — L739 Follicular disorder, unspecified: Secondary | ICD-10-CM | POA: Diagnosis not present

## 2015-04-30 DIAGNOSIS — L98499 Non-pressure chronic ulcer of skin of other sites with unspecified severity: Secondary | ICD-10-CM | POA: Diagnosis not present

## 2015-04-30 DIAGNOSIS — L570 Actinic keratosis: Secondary | ICD-10-CM | POA: Diagnosis not present

## 2015-04-30 DIAGNOSIS — D485 Neoplasm of uncertain behavior of skin: Secondary | ICD-10-CM | POA: Diagnosis not present

## 2015-05-01 DIAGNOSIS — L080 Pyoderma: Secondary | ICD-10-CM | POA: Diagnosis not present

## 2015-05-15 ENCOUNTER — Encounter: Payer: Self-pay | Admitting: Cardiology

## 2015-05-15 ENCOUNTER — Ambulatory Visit (INDEPENDENT_AMBULATORY_CARE_PROVIDER_SITE_OTHER): Payer: Medicare HMO | Admitting: Cardiology

## 2015-05-15 VITALS — BP 128/76 | HR 80 | Ht 69.5 in | Wt 186.2 lb

## 2015-05-15 DIAGNOSIS — R600 Localized edema: Secondary | ICD-10-CM

## 2015-05-15 DIAGNOSIS — I872 Venous insufficiency (chronic) (peripheral): Secondary | ICD-10-CM

## 2015-05-15 NOTE — Patient Instructions (Signed)
Continue your current therapy  I will see you in one year   

## 2015-05-15 NOTE — Progress Notes (Signed)
[name] [name] Date of Birth: June 30, 1927 Medical Record [id number]  History of Present Illness: [name] [name] is seen for followup. He has a history of lower extremity edema mostly on the left due to venous insufficiency.  Lower extremity venous Dopplers  showed no DVT. His echocardiogram showed normal LV function. He had aortic valve sclerosis. There was mild to moderate mitral insufficiency. He was seeing a vein specialist. Continues to use support hose. He denies any chest pain or SOB. He does complain that he is unable to sleep as long as he had in the past.   Current Outpatient Prescriptions on File Prior to Visit  Medication Sig Dispense Refill  . aspirin 81 MG tablet Take 81 mg by mouth at bedtime.     . dutasteride (AVODART) 0.5 MG capsule Take 0.5 mg by mouth daily.    . folic acid (FOLVITE) Q000111Q MCG tablet Take 400 mcg by mouth daily.    . Multiple Vitamins-Minerals (CENTRUM SILVER PO) Take by mouth.    . multivitamin-lutein (OCUVITE-LUTEIN) CAPS capsule Take 1 capsule by mouth daily.    Marland Kitchen OVER THE COUNTER MEDICATION Take 1 tablet by mouth daily. Focus Factor    . saw palmetto 160 MG capsule Take 160 mg by mouth 2 (two) times daily.    Marland Kitchen sulfaSALAzine (AZULFIDINE) 500 MG tablet Take 2,000 mg by mouth daily.     . tamsulosin (FLOMAX) 0.4 MG CAPS Take 0.4 mg by mouth daily.      No current facility-administered medications on file prior to visit.    Allergies  Allergen Reactions  . Simvastatin     "caused neuropathy pain"    Past Medical History  Diagnosis Date  . Peripheral neuropathy (HCC)     Elevated by Dr. love 2010  . Essential hypertension   . Hyperlipidemia   . Essential tremor   . Obstructive uropathy   . Hypotonic bladder     L4-L5  . Prostate cancer ([place]) 2005  . Edema     Past Surgical History  Procedure Laterality Date  . Skin cancer excision  1974    On chest wall  . Achilles tendon surgery  1995  . Colonoscopy      With polyp resection  . Total  knee arthroplasty  2011  . Total knee arthroplasty Left 04/09/2013    Procedure: LEFT TOTAL KNEE ARTHROPLASTY;  Surgeon: Gearlean Alf, MD;  Location: [facility];  Service: Orthopedics;  Laterality: Left;    History  Smoking status  . Never Smoker   Smokeless tobacco  . Not on file    History  Alcohol Use  . 0.6 oz/week  . 1 Glasses of wine per week    Family History  Problem Relation Age of Onset  . Hypertension Mother   . Heart failure Mother   . Diabetes Mother     Review of Systems: As noted in history of present illness.  All other systems were reviewed and are negative.  Physical Exam: BP 128/76 mmHg  Pulse 80  Ht 5' 9.5" (1.765 m)  Wt 84.46 kg (186 lb 3.2 oz)  BMI 27.11 kg/m2 He is a pleasant white male who appears younger than his stated age. HEENT: Normal. He has no JVD, adenopathy, thyromegaly, or bruits. Lungs: Clear Cardiovascular: Regular rate and rhythm, normal S1 and S2, grade 1/6 systolic murmur at the left upper sternal border. No diastolic murmur or S3. Abdomen: Overweight, soft, nontender. No masses or hepatosplenomegaly. Extremities: Femoral and pedal pulses are  2+. He 1-2+ edema on the left. He has prominent venous varicosities Skin: Warm and dry Neuro: Alert and oriented x3. Cranial nerves II through XII are intact.  LABORATORY DATA:  Ecg: today shows NSR with occ. PVC. Otherwise normal. I have personally reviewed and interpreted this study.   Assessment / Plan: 1. Edema. Secondary to venous insufficiency. His edema resolves at night with elevation.   I recommended conservative measures with elevation of his feet when possible, sodium restriction, and use of compression stockings.  2. Hypercholesterolemia. Patient is intolerant to statins. He is currently on Zetia.

## 2015-05-26 ENCOUNTER — Other Ambulatory Visit: Payer: Self-pay | Admitting: Family Medicine

## 2015-05-26 ENCOUNTER — Ambulatory Visit
Admission: RE | Admit: 2015-05-26 | Discharge: 2015-05-26 | Disposition: A | Discharge status: Home / Self Care | Payer: Medicare HMO | Source: Ambulatory Visit | Attending: Family Medicine | Admitting: Family Medicine

## 2015-05-26 DIAGNOSIS — R0789 Other chest pain: Secondary | ICD-10-CM | POA: Diagnosis not present

## 2015-05-26 DIAGNOSIS — M79602 Pain in left arm: Secondary | ICD-10-CM

## 2015-05-26 DIAGNOSIS — M25522 Pain in left elbow: Secondary | ICD-10-CM | POA: Diagnosis not present

## 2015-05-26 DIAGNOSIS — M25512 Pain in left shoulder: Secondary | ICD-10-CM | POA: Diagnosis not present

## 2015-05-26 DIAGNOSIS — M25532 Pain in left wrist: Secondary | ICD-10-CM | POA: Diagnosis not present

## 2015-05-26 DIAGNOSIS — W19XXXA Unspecified fall, initial encounter: Secondary | ICD-10-CM

## 2015-05-26 DIAGNOSIS — S80212A Abrasion, left knee, initial encounter: Secondary | ICD-10-CM | POA: Diagnosis not present

## 2015-05-26 DIAGNOSIS — M79632 Pain in left forearm: Secondary | ICD-10-CM | POA: Diagnosis not present

## 2015-05-29 DIAGNOSIS — E782 Mixed hyperlipidemia: Secondary | ICD-10-CM | POA: Diagnosis not present

## 2015-05-29 DIAGNOSIS — D696 Thrombocytopenia, unspecified: Secondary | ICD-10-CM | POA: Diagnosis not present

## 2015-05-29 DIAGNOSIS — N319 Neuromuscular dysfunction of bladder, unspecified: Secondary | ICD-10-CM | POA: Diagnosis not present

## 2015-05-29 DIAGNOSIS — K5150 Left sided colitis without complications: Secondary | ICD-10-CM | POA: Diagnosis not present

## 2015-05-29 DIAGNOSIS — I10 Essential (primary) hypertension: Secondary | ICD-10-CM | POA: Diagnosis not present

## 2015-05-29 DIAGNOSIS — Z0000 Encounter for general adult medical examination without abnormal findings: Secondary | ICD-10-CM | POA: Diagnosis not present

## 2015-05-29 DIAGNOSIS — C61 Malignant neoplasm of prostate: Secondary | ICD-10-CM | POA: Diagnosis not present

## 2015-05-29 DIAGNOSIS — N183 Chronic kidney disease, stage 3 (moderate): Secondary | ICD-10-CM | POA: Diagnosis not present

## 2015-06-04 DIAGNOSIS — L570 Actinic keratosis: Secondary | ICD-10-CM | POA: Diagnosis not present

## 2015-06-05 DIAGNOSIS — Z0100 Encounter for examination of eyes and vision without abnormal findings: Secondary | ICD-10-CM | POA: Diagnosis not present

## 2015-06-05 DIAGNOSIS — H26493 Other secondary cataract, bilateral: Secondary | ICD-10-CM | POA: Diagnosis not present

## 2015-06-05 DIAGNOSIS — H5202 Hypermetropia, left eye: Secondary | ICD-10-CM | POA: Diagnosis not present

## 2015-06-05 DIAGNOSIS — H02831 Dermatochalasis of right upper eyelid: Secondary | ICD-10-CM | POA: Diagnosis not present

## 2015-07-16 DIAGNOSIS — H04123 Dry eye syndrome of bilateral lacrimal glands: Secondary | ICD-10-CM | POA: Diagnosis not present

## 2015-07-16 DIAGNOSIS — L308 Other specified dermatitis: Secondary | ICD-10-CM | POA: Diagnosis not present

## 2015-07-31 DIAGNOSIS — C61 Malignant neoplasm of prostate: Secondary | ICD-10-CM | POA: Diagnosis not present

## 2015-08-04 DIAGNOSIS — C61 Malignant neoplasm of prostate: Secondary | ICD-10-CM | POA: Diagnosis not present

## 2015-08-04 DIAGNOSIS — N312 Flaccid neuropathic bladder, not elsewhere classified: Secondary | ICD-10-CM | POA: Diagnosis not present

## 2015-08-04 DIAGNOSIS — Z0000 Encounter for general adult medical examination without abnormal findings: Secondary | ICD-10-CM | POA: Diagnosis not present

## 2015-08-05 ENCOUNTER — Other Ambulatory Visit (HOSPITAL_COMMUNITY): Payer: Self-pay | Admitting: Urology

## 2015-08-05 DIAGNOSIS — C61 Malignant neoplasm of prostate: Secondary | ICD-10-CM

## 2015-08-21 ENCOUNTER — Ambulatory Visit (HOSPITAL_COMMUNITY): Payer: Medicare HMO

## 2015-08-21 ENCOUNTER — Encounter (HOSPITAL_COMMUNITY): Payer: Medicare HMO

## 2015-09-04 ENCOUNTER — Encounter (HOSPITAL_COMMUNITY)
Admission: RE | Admit: 2015-09-04 | Discharge: 2015-09-04 | Disposition: A | Discharge status: Home / Self Care | Payer: Medicare HMO | Source: Ambulatory Visit | Attending: Urology | Admitting: Urology

## 2015-09-04 DIAGNOSIS — M4185 Other forms of scoliosis, thoracolumbar region: Secondary | ICD-10-CM | POA: Diagnosis not present

## 2015-09-04 DIAGNOSIS — C61 Malignant neoplasm of prostate: Secondary | ICD-10-CM | POA: Diagnosis not present

## 2015-09-04 DIAGNOSIS — R937 Abnormal findings on diagnostic imaging of other parts of musculoskeletal system: Secondary | ICD-10-CM | POA: Insufficient documentation

## 2015-09-04 DIAGNOSIS — Z96653 Presence of artificial knee joint, bilateral: Secondary | ICD-10-CM | POA: Diagnosis not present

## 2015-09-04 MED ORDER — TECHNETIUM TC 99M MEDRONATE IV KIT
25.7000 | PACK | Freq: Once | INTRAVENOUS | Status: AC | PRN
  Administered 2015-09-04: 25.7 via INTRAVENOUS

## 2015-09-17 ENCOUNTER — Ambulatory Visit (HOSPITAL_COMMUNITY)
Admission: RE | Admit: 2015-09-17 | Discharge: 2015-09-17 | Disposition: A | Discharge status: Home / Self Care | Payer: Medicare HMO | Source: Ambulatory Visit | Attending: Urology | Admitting: Urology

## 2015-09-17 ENCOUNTER — Other Ambulatory Visit (HOSPITAL_COMMUNITY): Payer: Self-pay | Admitting: Urology

## 2015-09-17 DIAGNOSIS — M5137 Other intervertebral disc degeneration, lumbosacral region: Secondary | ICD-10-CM | POA: Diagnosis not present

## 2015-09-17 DIAGNOSIS — C61 Malignant neoplasm of prostate: Secondary | ICD-10-CM

## 2015-09-17 DIAGNOSIS — M5136 Other intervertebral disc degeneration, lumbar region: Secondary | ICD-10-CM | POA: Insufficient documentation

## 2015-09-17 DIAGNOSIS — M4316 Spondylolisthesis, lumbar region: Secondary | ICD-10-CM | POA: Insufficient documentation

## 2015-09-17 DIAGNOSIS — R948 Abnormal results of function studies of other organs and systems: Secondary | ICD-10-CM | POA: Insufficient documentation

## 2015-10-11 DIAGNOSIS — S76312A Strain of muscle, fascia and tendon of the posterior muscle group at thigh level, left thigh, initial encounter: Secondary | ICD-10-CM | POA: Diagnosis not present

## 2015-10-13 DIAGNOSIS — M25552 Pain in left hip: Secondary | ICD-10-CM | POA: Diagnosis not present

## 2015-10-13 DIAGNOSIS — S76302A Unspecified injury of muscle, fascia and tendon of the posterior muscle group at thigh level, left thigh, initial encounter: Secondary | ICD-10-CM | POA: Diagnosis not present

## 2015-10-31 DIAGNOSIS — S76302D Unspecified injury of muscle, fascia and tendon of the posterior muscle group at thigh level, left thigh, subsequent encounter: Secondary | ICD-10-CM | POA: Diagnosis not present

## 2015-10-31 DIAGNOSIS — M25552 Pain in left hip: Secondary | ICD-10-CM | POA: Diagnosis not present

## 2015-11-05 DIAGNOSIS — H5203 Hypermetropia, bilateral: Secondary | ICD-10-CM | POA: Diagnosis not present

## 2015-11-05 DIAGNOSIS — H02831 Dermatochalasis of right upper eyelid: Secondary | ICD-10-CM | POA: Diagnosis not present

## 2015-11-05 DIAGNOSIS — S76302D Unspecified injury of muscle, fascia and tendon of the posterior muscle group at thigh level, left thigh, subsequent encounter: Secondary | ICD-10-CM | POA: Diagnosis not present

## 2015-11-05 DIAGNOSIS — H04123 Dry eye syndrome of bilateral lacrimal glands: Secondary | ICD-10-CM | POA: Diagnosis not present

## 2015-11-07 DIAGNOSIS — S76302D Unspecified injury of muscle, fascia and tendon of the posterior muscle group at thigh level, left thigh, subsequent encounter: Secondary | ICD-10-CM | POA: Diagnosis not present

## 2015-11-17 DIAGNOSIS — S76302D Unspecified injury of muscle, fascia and tendon of the posterior muscle group at thigh level, left thigh, subsequent encounter: Secondary | ICD-10-CM | POA: Diagnosis not present

## 2015-11-21 DIAGNOSIS — S76302D Unspecified injury of muscle, fascia and tendon of the posterior muscle group at thigh level, left thigh, subsequent encounter: Secondary | ICD-10-CM | POA: Diagnosis not present

## 2015-11-25 DIAGNOSIS — S76302D Unspecified injury of muscle, fascia and tendon of the posterior muscle group at thigh level, left thigh, subsequent encounter: Secondary | ICD-10-CM | POA: Diagnosis not present

## 2015-11-28 DIAGNOSIS — S76302D Unspecified injury of muscle, fascia and tendon of the posterior muscle group at thigh level, left thigh, subsequent encounter: Secondary | ICD-10-CM | POA: Diagnosis not present

## 2015-12-01 DIAGNOSIS — S76302D Unspecified injury of muscle, fascia and tendon of the posterior muscle group at thigh level, left thigh, subsequent encounter: Secondary | ICD-10-CM | POA: Diagnosis not present

## 2016-01-07 DIAGNOSIS — R69 Illness, unspecified: Secondary | ICD-10-CM | POA: Diagnosis not present

## 2016-01-12 DIAGNOSIS — H02834 Dermatochalasis of left upper eyelid: Secondary | ICD-10-CM | POA: Diagnosis not present

## 2016-01-12 DIAGNOSIS — H02831 Dermatochalasis of right upper eyelid: Secondary | ICD-10-CM | POA: Diagnosis not present

## 2016-01-19 DIAGNOSIS — H903 Sensorineural hearing loss, bilateral: Secondary | ICD-10-CM | POA: Diagnosis not present

## 2016-02-09 DIAGNOSIS — M19011 Primary osteoarthritis, right shoulder: Secondary | ICD-10-CM | POA: Diagnosis not present

## 2016-02-09 DIAGNOSIS — M25511 Pain in right shoulder: Secondary | ICD-10-CM | POA: Diagnosis not present

## 2016-02-16 DIAGNOSIS — C61 Malignant neoplasm of prostate: Secondary | ICD-10-CM | POA: Diagnosis not present

## 2016-02-16 DIAGNOSIS — N312 Flaccid neuropathic bladder, not elsewhere classified: Secondary | ICD-10-CM | POA: Diagnosis not present

## 2016-03-03 DIAGNOSIS — M12811 Other specific arthropathies, not elsewhere classified, right shoulder: Secondary | ICD-10-CM | POA: Diagnosis not present

## 2016-03-03 DIAGNOSIS — M25511 Pain in right shoulder: Secondary | ICD-10-CM | POA: Diagnosis not present

## 2016-03-31 DIAGNOSIS — M12811 Other specific arthropathies, not elsewhere classified, right shoulder: Secondary | ICD-10-CM | POA: Diagnosis not present

## 2016-03-31 DIAGNOSIS — M19011 Primary osteoarthritis, right shoulder: Secondary | ICD-10-CM | POA: Diagnosis not present

## 2016-03-31 DIAGNOSIS — M25511 Pain in right shoulder: Secondary | ICD-10-CM | POA: Diagnosis not present

## 2016-04-07 DIAGNOSIS — I10 Essential (primary) hypertension: Secondary | ICD-10-CM | POA: Diagnosis not present

## 2016-04-07 DIAGNOSIS — M25511 Pain in right shoulder: Secondary | ICD-10-CM | POA: Diagnosis not present

## 2016-04-07 DIAGNOSIS — Z23 Encounter for immunization: Secondary | ICD-10-CM | POA: Diagnosis not present

## 2016-04-12 ENCOUNTER — Telehealth: Payer: Self-pay

## 2016-04-12 NOTE — Telephone Encounter (Signed)
Received surgical clearance from [facility].Dr.Jordan cleared patient for upcoming surgery.Form faxed back to fax # [phone].

## 2016-05-25 NOTE — H&P (Signed)
[name] [name] is an 82 y.o. male.    Chief Complaint: right shoulder pain  HPI: Pt is a 82 y.o. male complaining of right shoulder pain for multiple years. Pain had continually increased since the beginning. X-rays in the clinic show end-stage arthritic changes of the right shoulder. Pt has tried various conservative treatments which have failed to alleviate their symptoms, including injections and therapy. Various options are discussed with the patient. Risks, benefits and expectations were discussed with the patient. Patient understand the risks, benefits and expectations and wishes to proceed with surgery.   PCP:  [name], MD  D/C Plans: Home  PMH: Past Medical History:  Diagnosis Date  . Edema   . Essential hypertension   . Essential tremor   . Hyperlipidemia   . Hypotonic bladder    L4-L5  . Obstructive uropathy   . Peripheral neuropathy (HCC)    Elevated by Dr. love 2010  . Prostate cancer ([place]) 2005    PSH: Past Surgical History:  Procedure Laterality Date  . [place]  . COLONOSCOPY     With polyp resection  . SKIN CANCER EXCISION  1974   On chest wall  . TOTAL KNEE ARTHROPLASTY  2011  . TOTAL KNEE ARTHROPLASTY Left 04/09/2013   Procedure: LEFT TOTAL KNEE ARTHROPLASTY;  Surgeon: Gearlean Alf, MD;  Location: [facility];  Service: Orthopedics;  Laterality: Left;    Social History:  reports that he has never smoked. He does not have any smokeless tobacco history on file. He reports that he drinks about 0.6 oz of alcohol per week . He reports that he does not use drugs.  Allergies:  Allergies  Allergen Reactions  . Simvastatin     "caused neuropathy pain"    Medications: No current facility-administered medications for this encounter.    Current Outpatient Prescriptions  Medication Sig Dispense Refill  . aspirin 81 MG tablet Take 81 mg by mouth at bedtime.     Marland Kitchen b complex vitamins tablet Take 1 tablet by mouth daily.    Marland Kitchen  dutasteride (AVODART) 0.5 MG capsule Take 0.5 mg by mouth daily.    . folic acid (FOLVITE) 161 MCG tablet Take 800 mcg by mouth daily.     Marland Kitchen MELATONIN PO Take 1 tablet by mouth at bedtime.    . Multiple Vitamins-Minerals (CENTRUM SILVER PO) Take 1 tablet by mouth daily.     . multivitamin-lutein (OCUVITE-LUTEIN) CAPS capsule Take 1 capsule by mouth daily.    . Omega-3 Fatty Acids (FISH OIL PO) Take 1 capsule by mouth daily.    Marland Kitchen OVER THE COUNTER MEDICATION Take 1 tablet by mouth daily. Focus Factor    . saw palmetto 160 MG capsule Take 160 mg by mouth 2 (two) times daily.    Marland Kitchen sulfaSALAzine (AZULFIDINE) 500 MG tablet Take 2,000 mg by mouth daily.     . tamsulosin (FLOMAX) 0.4 MG CAPS Take 0.4 mg by mouth 2 (two) times daily.       No results found for this or any previous visit (from the past 48 hour(s)). No results found.  ROS: Pain with rom of the right upper extremity  Physical Exam:  Alert and oriented 82 y.o. male in no acute distress Cranial nerves 2-12 intact Cervical spine: full rom with no tenderness, nv intact distally Chest: active breath sounds bilaterally, no wheeze rhonchi or rales Heart: regular rate and rhythm, no murmur Abd: non tender non distended with active bowel sounds Hip  is stable with rom  Right shoulder with limited rom and strength nv intact distally No rashes   Assessment/Plan Assessment: right shoulder rotator cuff arthropathy  Plan: Patient will undergo a right reverse total shoulder by Dr. Veverly Fells at [facility]. Risks benefits and expectations were discussed with the patient. Patient understand risks, benefits and expectations and wishes to proceed.

## 2016-05-26 ENCOUNTER — Encounter (HOSPITAL_COMMUNITY)
Admission: RE | Admit: 2016-05-26 | Discharge: 2016-05-26 | Disposition: A | Discharge status: Home / Self Care | Payer: Medicare HMO | Source: Ambulatory Visit | Attending: Orthopedic Surgery | Admitting: Orthopedic Surgery

## 2016-05-26 ENCOUNTER — Encounter (HOSPITAL_COMMUNITY): Payer: Self-pay

## 2016-05-26 DIAGNOSIS — M12811 Other specific arthropathies, not elsewhere classified, right shoulder: Secondary | ICD-10-CM | POA: Diagnosis not present

## 2016-05-26 DIAGNOSIS — Z01812 Encounter for preprocedural laboratory examination: Secondary | ICD-10-CM | POA: Diagnosis not present

## 2016-05-26 HISTORY — DX: Unspecified osteoarthritis, unspecified site: M19.90

## 2016-05-26 LAB — BASIC METABOLIC PANEL
Anion gap: 9 (ref 5–15)
BUN: 17 mg/dL (ref 6–20)
CO2: 24 mmol/L (ref 22–32)
Calcium: 9.1 mg/dL (ref 8.9–10.3)
Chloride: 106 mmol/L (ref 101–111)
Creatinine, Ser: 1.1 mg/dL (ref 0.61–1.24)
GFR calc Af Amer: >60 mL/min (ref >60)
GFR calc non Af Amer: 59 mL/min — ABNORMAL LOW (ref >60)
Glucose, Bld: 89 mg/dL (ref 65–99)
Potassium: 4.1 mmol/L (ref 3.5–5.1)
Sodium: 139 mmol/L (ref 135–145)

## 2016-05-26 LAB — CBC
HCT: 41.9 % (ref 39.0–52.0)
Hemoglobin: 14.1 g/dL (ref 13.0–17.0)
MCH: 31.3 pg (ref 26.0–34.0)
MCHC: 33.7 g/dL (ref 30.0–36.0)
MCV: 93.1 fL (ref 78.0–100.0)
Platelets: 167 10*3/uL (ref 150–400)
RBC: 4.5 MIL/uL (ref 4.22–5.81)
RDW: 13.4 % (ref 11.5–15.5)
WBC: 7.6 10*3/uL (ref 4.0–10.5)

## 2016-05-26 LAB — SURGICAL PCR SCREEN
MRSA, PCR: NEGATIVE
Staphylococcus aureus: NEGATIVE

## 2016-05-26 NOTE — Pre-Procedure Instructions (Addendum)
[name] [name]  05/26/2016      [place], [place] [address] [place] [place] [address] Phone: [phone] Fax: [phone]    Your procedure is scheduled on 06/04/16.  Report to [facility] Admitting at 800 A.M.  Call this number if you have problems the morning of surgery:  [phone]   Remember:  Do not eat food or drink liquids after midnight.  Take these medicines the morning of surgery with A SIP OF WATER dutasteride(avodart),flomax  STOP all herbel meds, nsaids (aleve,naproxen,advil,ibuprofen)7 days prior to surgery starting 05/28/16 including aspirin, B complex vit, folic acid, melatonin, multi vit, fish oil,saw palmetto,other over the counter med   Do not wear jewelry, make-up or nail polish.  Do not wear lotions, powders, or perfumes, or deoderant.  Do not shave 48 hours prior to surgery.  Men may shave face and neck.  Do not bring valuables to the hospital.  [facility] is not responsible for any belongings or valuables.  Contacts, dentures or bridgework may not be worn into surgery.  Leave your suitcase in the car.  After surgery it may be brought to your room.  For patients admitted to the hospital, discharge time will be determined by your treatment team.  Patients discharged the day of surgery will not be allowed to drive home.   Special instructions:   Special Instructions: [name] - Preparing for Surgery  Before surgery, you can play an important role.  Because skin is not sterile, your skin needs to be as free of germs as possible.  You can reduce the number of germs on you skin by washing with CHG (chlorahexidine gluconate) soap before surgery.  CHG is an antiseptic cleaner which kills germs and bonds with the skin to continue killing germs even after washing.  Please DO NOT use if you have an allergy to CHG or antibacterial soaps.  If your skin becomes reddened/irritated stop using the  CHG and inform your nurse when you arrive at Short Stay.  Do not shave (including legs and underarms) for at least 48 hours prior to the first CHG shower.  You may shave your face.  Please follow these instructions carefully:   1.  Shower with CHG Soap the night before surgery and the morning of Surgery.  2.  If you choose to wash your hair, wash your hair first as usual with your normal shampoo.  3.  After you shampoo, rinse your hair and body thoroughly to remove the Shampoo.  4.  Use CHG as you would any other liquid soap.  You can apply chg directly  to the skin and wash gently with scrungie or a clean washcloth.  5.  Apply the CHG Soap to your body ONLY FROM THE NECK DOWN.  Do not use on open wounds or open sores.  Avoid contact with your eyes ears, mouth and genitals (private parts).  Wash genitals (private parts)       with your normal soap.  6.  Wash thoroughly, paying special attention to the area where your surgery will be performed.  7.  Thoroughly rinse your body with warm water from the neck down.  8.  DO NOT shower/wash with your normal soap after using and rinsing off the CHG Soap.  9.  Pat yourself dry with a clean towel.            10.  Wear clean pajamas.  11.  Place clean sheets on your bed the night of your first shower and do not sleep with pets.  Day of Surgery  Do not apply any lotions/deodorants the morning of surgery.  Please wear clean clothes to the hospital/surgery center.  Please read over the  fact sheets that you were given.

## 2016-05-27 ENCOUNTER — Inpatient Hospital Stay (HOSPITAL_COMMUNITY)
Admission: RE | Admit: 2016-05-27 | Discharge: 2016-05-27 | Disposition: A | Discharge status: Home / Self Care | Payer: Medicare HMO | Source: Ambulatory Visit

## 2016-06-04 ENCOUNTER — Encounter (HOSPITAL_COMMUNITY)
Admission: RE | Disposition: A | Discharge status: Home Health | Payer: Self-pay | Source: Ambulatory Visit | Attending: Orthopedic Surgery

## 2016-06-04 ENCOUNTER — Encounter (HOSPITAL_COMMUNITY): Payer: Self-pay | Admitting: Anesthesiology

## 2016-06-04 ENCOUNTER — Inpatient Hospital Stay (HOSPITAL_COMMUNITY): Payer: Medicare HMO | Admitting: Anesthesiology

## 2016-06-04 ENCOUNTER — Inpatient Hospital Stay (HOSPITAL_COMMUNITY): Payer: Medicare HMO

## 2016-06-04 ENCOUNTER — Inpatient Hospital Stay (HOSPITAL_COMMUNITY)
Admission: RE | Admit: 2016-06-04 | Discharge: 2016-06-05 | DRG: 483 | Disposition: A | Discharge status: Home Health | Payer: Medicare HMO | Source: Ambulatory Visit | Attending: Orthopedic Surgery | Admitting: Orthopedic Surgery

## 2016-06-04 DIAGNOSIS — Z85828 Personal history of other malignant neoplasm of skin: Secondary | ICD-10-CM | POA: Diagnosis not present

## 2016-06-04 DIAGNOSIS — Z471 Aftercare following joint replacement surgery: Secondary | ICD-10-CM | POA: Diagnosis not present

## 2016-06-04 DIAGNOSIS — E785 Hyperlipidemia, unspecified: Secondary | ICD-10-CM | POA: Diagnosis present

## 2016-06-04 DIAGNOSIS — N312 Flaccid neuropathic bladder, not elsewhere classified: Secondary | ICD-10-CM | POA: Diagnosis present

## 2016-06-04 DIAGNOSIS — M25511 Pain in right shoulder: Secondary | ICD-10-CM | POA: Diagnosis present

## 2016-06-04 DIAGNOSIS — Z96611 Presence of right artificial shoulder joint: Secondary | ICD-10-CM

## 2016-06-04 DIAGNOSIS — N139 Obstructive and reflux uropathy, unspecified: Secondary | ICD-10-CM | POA: Diagnosis not present

## 2016-06-04 DIAGNOSIS — G8918 Other acute postprocedural pain: Secondary | ICD-10-CM | POA: Diagnosis not present

## 2016-06-04 DIAGNOSIS — G629 Polyneuropathy, unspecified: Secondary | ICD-10-CM | POA: Diagnosis not present

## 2016-06-04 DIAGNOSIS — Z96652 Presence of left artificial knee joint: Secondary | ICD-10-CM | POA: Diagnosis present

## 2016-06-04 DIAGNOSIS — M19011 Primary osteoarthritis, right shoulder: Secondary | ICD-10-CM | POA: Diagnosis not present

## 2016-06-04 DIAGNOSIS — Z7982 Long term (current) use of aspirin: Secondary | ICD-10-CM

## 2016-06-04 DIAGNOSIS — Z79899 Other long term (current) drug therapy: Secondary | ICD-10-CM | POA: Diagnosis not present

## 2016-06-04 DIAGNOSIS — M75101 Unspecified rotator cuff tear or rupture of right shoulder, not specified as traumatic: Secondary | ICD-10-CM | POA: Diagnosis present

## 2016-06-04 DIAGNOSIS — M12811 Other specific arthropathies, not elsewhere classified, right shoulder: Secondary | ICD-10-CM | POA: Diagnosis not present

## 2016-06-04 DIAGNOSIS — Z8546 Personal history of malignant neoplasm of prostate: Secondary | ICD-10-CM | POA: Diagnosis not present

## 2016-06-04 DIAGNOSIS — I10 Essential (primary) hypertension: Secondary | ICD-10-CM | POA: Diagnosis present

## 2016-06-04 DIAGNOSIS — G250 Essential tremor: Secondary | ICD-10-CM | POA: Diagnosis present

## 2016-06-04 HISTORY — DX: Noninfective gastroenteritis and colitis, unspecified: K52.9

## 2016-06-04 HISTORY — PX: REVERSE SHOULDER ARTHROPLASTY: SHX5054

## 2016-06-04 SURGERY — ARTHROPLASTY, SHOULDER, TOTAL, REVERSE
Anesthesia: General | Site: Shoulder | Laterality: Right

## 2016-06-04 MED ORDER — PROSIGHT PO TABS
1.0000 | ORAL_TABLET | Freq: Every day | ORAL | Status: DC
  Administered 2016-06-04 – 2016-06-05 (×2): 1 via ORAL
  Filled 2016-06-04 (×2): qty 1

## 2016-06-04 MED ORDER — FOLIC ACID 1 MG PO TABS
1.0000 mg | ORAL_TABLET | Freq: Every day | ORAL | Status: DC
  Administered 2016-06-04 – 2016-06-05 (×2): 1 mg via ORAL
  Filled 2016-06-04 (×2): qty 1

## 2016-06-04 MED ORDER — FENTANYL CITRATE (PF) 100 MCG/2ML IJ SOLN: 25.0000 ug | INTRAMUSCULAR | Status: DC | PRN

## 2016-06-04 MED ORDER — LIDOCAINE HCL (CARDIAC) 20 MG/ML IV SOLN
INTRAVENOUS | Status: DC | PRN
  Administered 2016-06-04: 100 mg via INTRAVENOUS

## 2016-06-04 MED ORDER — FENTANYL CITRATE (PF) 100 MCG/2ML IJ SOLN
INTRAMUSCULAR | Status: DC | PRN
  Administered 2016-06-04: 50 ug via INTRAVENOUS

## 2016-06-04 MED ORDER — FENTANYL CITRATE (PF) 100 MCG/2ML IJ SOLN
INTRAMUSCULAR | Status: AC
  Administered 2016-06-04: 50 ug via INTRAVENOUS
  Filled 2016-06-04: qty 2

## 2016-06-04 MED ORDER — METOCLOPRAMIDE HCL 5 MG PO TABS: 5.0000 mg | ORAL_TABLET | Freq: Three times a day (TID) | ORAL | Status: DC | PRN

## 2016-06-04 MED ORDER — ONDANSETRON HCL 4 MG/2ML IJ SOLN: 4.0000 mg | Freq: Once | INTRAMUSCULAR | Status: DC | PRN

## 2016-06-04 MED ORDER — PROPOFOL 10 MG/ML IV BOLUS
INTRAVENOUS | Status: AC
  Filled 2016-06-04: qty 20

## 2016-06-04 MED ORDER — LACTATED RINGERS IV SOLN
INTRAVENOUS | Status: DC
  Administered 2016-06-04: 09:00:00 via INTRAVENOUS

## 2016-06-04 MED ORDER — OMEGA-3-ACID ETHYL ESTERS 1 G PO CAPS
1.0000 g | ORAL_CAPSULE | Freq: Every day | ORAL | Status: DC
  Administered 2016-06-04 – 2016-06-05 (×2): 1 g via ORAL
  Filled 2016-06-04 (×2): qty 1

## 2016-06-04 MED ORDER — SUGAMMADEX SODIUM 200 MG/2ML IV SOLN
INTRAVENOUS | Status: AC
  Filled 2016-06-04: qty 2

## 2016-06-04 MED ORDER — ROPIVACAINE HCL 7.5 MG/ML IJ SOLN
INTRAMUSCULAR | Status: DC | PRN
  Administered 2016-06-04: 20 mL via PERINEURAL

## 2016-06-04 MED ORDER — ONDANSETRON HCL 4 MG/2ML IJ SOLN
INTRAMUSCULAR | Status: AC
  Filled 2016-06-04: qty 2

## 2016-06-04 MED ORDER — SULFASALAZINE 500 MG PO TABS
2000.0000 mg | ORAL_TABLET | Freq: Every day | ORAL | Status: DC
  Administered 2016-06-04 – 2016-06-05 (×2): 2000 mg via ORAL
  Filled 2016-06-04 (×2): qty 4

## 2016-06-04 MED ORDER — THROMBIN 5000 UNITS EX SOLR
CUTANEOUS | Status: AC
  Filled 2016-06-04: qty 5000

## 2016-06-04 MED ORDER — METOCLOPRAMIDE HCL 5 MG/ML IJ SOLN: 5.0000 mg | Freq: Three times a day (TID) | INTRAMUSCULAR | Status: DC | PRN

## 2016-06-04 MED ORDER — ONDANSETRON HCL 4 MG/2ML IJ SOLN: 4.0000 mg | Freq: Four times a day (QID) | INTRAMUSCULAR | Status: DC | PRN

## 2016-06-04 MED ORDER — EPHEDRINE SULFATE-NACL 50-0.9 MG/10ML-% IV SOSY
PREFILLED_SYRINGE | INTRAVENOUS | Status: DC | PRN
  Administered 2016-06-04: 10 mg via INTRAVENOUS
  Administered 2016-06-04 (×2): 5 mg via INTRAVENOUS

## 2016-06-04 MED ORDER — EPHEDRINE 5 MG/ML INJ
INTRAVENOUS | Status: AC
  Filled 2016-06-04: qty 10

## 2016-06-04 MED ORDER — BUPIVACAINE-EPINEPHRINE (PF) 0.25% -1:200000 IJ SOLN
INTRAMUSCULAR | Status: AC
  Filled 2016-06-04: qty 30

## 2016-06-04 MED ORDER — HYDROCODONE-ACETAMINOPHEN 5-325 MG PO TABS: 1.0000 | ORAL_TABLET | Freq: Four times a day (QID) | ORAL | 0 refills | Status: DC | PRN

## 2016-06-04 MED ORDER — SUGAMMADEX SODIUM 200 MG/2ML IV SOLN
INTRAVENOUS | Status: DC | PRN
  Administered 2016-06-04: 200 mg via INTRAVENOUS

## 2016-06-04 MED ORDER — CEFAZOLIN SODIUM-DEXTROSE 2-4 GM/100ML-% IV SOLN
2.0000 g | Freq: Four times a day (QID) | INTRAVENOUS | Status: AC
  Administered 2016-06-04 – 2016-06-05 (×3): 2 g via INTRAVENOUS
  Filled 2016-06-04 (×3): qty 100

## 2016-06-04 MED ORDER — PHENOL 1.4 % MT LIQD: 1.0000 | OROMUCOSAL | Status: DC | PRN

## 2016-06-04 MED ORDER — BISACODYL 10 MG RE SUPP: 10.0000 mg | Freq: Every day | RECTAL | Status: DC | PRN

## 2016-06-04 MED ORDER — MELATONIN 3 MG PO TABS
1.0000 | ORAL_TABLET | Freq: Every day | ORAL | Status: DC
  Administered 2016-06-04: 3 mg via ORAL
  Filled 2016-06-04: qty 1

## 2016-06-04 MED ORDER — MORPHINE SULFATE (PF) 2 MG/ML IV SOLN
2.0000 mg | INTRAVENOUS | Status: DC | PRN
  Administered 2016-06-05: 2 mg via INTRAVENOUS
  Filled 2016-06-04: qty 1

## 2016-06-04 MED ORDER — HYDROCODONE-ACETAMINOPHEN 5-325 MG PO TABS
1.0000 | ORAL_TABLET | ORAL | Status: DC | PRN
  Administered 2016-06-05 (×3): 1 via ORAL
  Filled 2016-06-04 (×3): qty 1

## 2016-06-04 MED ORDER — TAMSULOSIN HCL 0.4 MG PO CAPS
0.4000 mg | ORAL_CAPSULE | Freq: Two times a day (BID) | ORAL | Status: DC
  Administered 2016-06-04 – 2016-06-05 (×2): 0.4 mg via ORAL
  Filled 2016-06-04 (×2): qty 1

## 2016-06-04 MED ORDER — CHLORHEXIDINE GLUCONATE 4 % EX LIQD: 60.0000 mL | Freq: Once | CUTANEOUS | Status: DC

## 2016-06-04 MED ORDER — PROPOFOL 10 MG/ML IV BOLUS
INTRAVENOUS | Status: DC | PRN
  Administered 2016-06-04: 130 mg via INTRAVENOUS

## 2016-06-04 MED ORDER — LIDOCAINE 2% (20 MG/ML) 5 ML SYRINGE
INTRAMUSCULAR | Status: AC
  Filled 2016-06-04: qty 5

## 2016-06-04 MED ORDER — CEFAZOLIN SODIUM-DEXTROSE 2-4 GM/100ML-% IV SOLN
2.0000 g | INTRAVENOUS | Status: AC
  Administered 2016-06-04: 2 g via INTRAVENOUS

## 2016-06-04 MED ORDER — SAW PALMETTO (SERENOA REPENS) 160 MG PO CAPS: 160.0000 mg | ORAL_CAPSULE | Freq: Two times a day (BID) | ORAL | Status: DC

## 2016-06-04 MED ORDER — FENTANYL CITRATE (PF) 100 MCG/2ML IJ SOLN
INTRAMUSCULAR | Status: AC
  Filled 2016-06-04: qty 2

## 2016-06-04 MED ORDER — CEFAZOLIN SODIUM-DEXTROSE 2-4 GM/100ML-% IV SOLN
INTRAVENOUS | Status: AC
  Filled 2016-06-04: qty 100

## 2016-06-04 MED ORDER — BUPIVACAINE-EPINEPHRINE 0.25% -1:200000 IJ SOLN
INTRAMUSCULAR | Status: DC | PRN
  Administered 2016-06-04: 6 mL

## 2016-06-04 MED ORDER — TRAMADOL HCL 50 MG PO TABS: 50.0000 mg | ORAL_TABLET | Freq: Four times a day (QID) | ORAL | 0 refills | Status: DC | PRN

## 2016-06-04 MED ORDER — ACETAMINOPHEN 650 MG RE SUPP: 650.0000 mg | Freq: Four times a day (QID) | RECTAL | Status: DC | PRN

## 2016-06-04 MED ORDER — 0.9 % SODIUM CHLORIDE (POUR BTL) OPTIME
TOPICAL | Status: DC | PRN
  Administered 2016-06-04: 1000 mL

## 2016-06-04 MED ORDER — DEXTROSE 5 % IV SOLN
INTRAVENOUS | Status: DC | PRN
  Administered 2016-06-04: 50 ug/min via INTRAVENOUS

## 2016-06-04 MED ORDER — ACETAMINOPHEN 325 MG PO TABS: 650.0000 mg | ORAL_TABLET | Freq: Four times a day (QID) | ORAL | Status: DC | PRN

## 2016-06-04 MED ORDER — ROCURONIUM BROMIDE 100 MG/10ML IV SOLN
INTRAVENOUS | Status: DC | PRN
  Administered 2016-06-04: 30 mg via INTRAVENOUS

## 2016-06-04 MED ORDER — MIDAZOLAM HCL 2 MG/2ML IJ SOLN
INTRAMUSCULAR | Status: AC
  Filled 2016-06-04: qty 2

## 2016-06-04 MED ORDER — ASPIRIN 81 MG PO CHEW
81.0000 mg | CHEWABLE_TABLET | Freq: Every day | ORAL | Status: DC
  Administered 2016-06-04: 81 mg via ORAL
  Filled 2016-06-04: qty 1

## 2016-06-04 MED ORDER — B COMPLEX-C PO TABS
1.0000 | ORAL_TABLET | Freq: Every day | ORAL | Status: DC
  Administered 2016-06-04 – 2016-06-05 (×2): 1 via ORAL
  Filled 2016-06-04 (×2): qty 1

## 2016-06-04 MED ORDER — TRAMADOL HCL 50 MG PO TABS: 50.0000 mg | ORAL_TABLET | Freq: Four times a day (QID) | ORAL | Status: DC | PRN

## 2016-06-04 MED ORDER — FENTANYL CITRATE (PF) 100 MCG/2ML IJ SOLN
50.0000 ug | Freq: Once | INTRAMUSCULAR | Status: AC
  Administered 2016-06-04: 50 ug via INTRAVENOUS

## 2016-06-04 MED ORDER — DOCUSATE SODIUM 100 MG PO CAPS
100.0000 mg | ORAL_CAPSULE | Freq: Two times a day (BID) | ORAL | Status: DC
  Administered 2016-06-04 – 2016-06-05 (×3): 100 mg via ORAL
  Filled 2016-06-04 (×3): qty 1

## 2016-06-04 MED ORDER — ROCURONIUM BROMIDE 10 MG/ML (PF) SYRINGE
PREFILLED_SYRINGE | INTRAVENOUS | Status: AC
  Filled 2016-06-04: qty 10

## 2016-06-04 MED ORDER — ONDANSETRON HCL 4 MG PO TABS: 4.0000 mg | ORAL_TABLET | Freq: Four times a day (QID) | ORAL | Status: DC | PRN

## 2016-06-04 MED ORDER — POLYETHYLENE GLYCOL 3350 17 G PO PACK: 17.0000 g | PACK | Freq: Every day | ORAL | Status: DC | PRN

## 2016-06-04 MED ORDER — ONDANSETRON HCL 4 MG/2ML IJ SOLN
INTRAMUSCULAR | Status: DC | PRN
  Administered 2016-06-04: 4 mg via INTRAVENOUS

## 2016-06-04 MED ORDER — SODIUM CHLORIDE 0.9 % IV SOLN
INTRAVENOUS | Status: DC
  Administered 2016-06-04: 13:00:00 via INTRAVENOUS

## 2016-06-04 MED ORDER — DUTASTERIDE 0.5 MG PO CAPS
0.5000 mg | ORAL_CAPSULE | Freq: Every day | ORAL | Status: DC
  Administered 2016-06-05: 0.5 mg via ORAL
  Filled 2016-06-04: qty 1

## 2016-06-04 MED ORDER — ADULT MULTIVITAMIN W/MINERALS CH
1.0000 | ORAL_TABLET | Freq: Every day | ORAL | Status: DC
  Administered 2016-06-04 – 2016-06-05 (×2): 1 via ORAL
  Filled 2016-06-04 (×2): qty 1

## 2016-06-04 MED ORDER — MENTHOL 3 MG MT LOZG: 1.0000 | LOZENGE | OROMUCOSAL | Status: DC | PRN

## 2016-06-04 SURGICAL SUPPLY — 71 items
BIT DRILL 170X2.5X (BIT) ×1 IMPLANT
BIT DRILL 5/64X5 DISP (BIT) ×3 IMPLANT
BIT DRL 170X2.5X (BIT) ×1
BLADE SAG 18X100X1.27 (BLADE) ×3 IMPLANT
CAPT SHLDR REVTOTAL 1 ×3 IMPLANT
CLOSURE STERI-STRIP 1/2X4 (GAUZE/BANDAGES/DRESSINGS) ×1
CLOSURE WOUND 1/2 X4 (GAUZE/BANDAGES/DRESSINGS) ×1
CLSR STERI-STRIP ANTIMIC 1/2X4 (GAUZE/BANDAGES/DRESSINGS) ×2 IMPLANT
COVER SURGICAL LIGHT HANDLE (MISCELLANEOUS) ×3 IMPLANT
DRAPE IMP U-DRAPE 54X76 (DRAPES) ×6 IMPLANT
DRAPE INCISE IOBAN 66X45 STRL (DRAPES) ×3 IMPLANT
DRAPE ORTHO SPLIT 77X108 STRL (DRAPES) ×6
DRAPE SURG ORHT 6 SPLT 77X108 (DRAPES) ×2 IMPLANT
DRAPE U-SHAPE 47X51 STRL (DRAPES) ×3 IMPLANT
DRAPE X-RAY CASS 24X20 (DRAPES) IMPLANT
DRILL 2.5 (BIT) ×3
DRSG ADAPTIC 3X8 NADH LF (GAUZE/BANDAGES/DRESSINGS) ×3 IMPLANT
DRSG PAD ABDOMINAL 8X10 ST (GAUZE/BANDAGES/DRESSINGS) ×3 IMPLANT
DURAPREP 26ML APPLICATOR (WOUND CARE) ×3 IMPLANT
ELECT BLADE 4.0 EZ CLEAN MEGAD (MISCELLANEOUS) ×3
ELECT NEEDLE TIP 2.8 STRL (NEEDLE) ×3 IMPLANT
ELECT REM PT RETURN 9FT ADLT (ELECTROSURGICAL) ×3
ELECTRODE BLDE 4.0 EZ CLN MEGD (MISCELLANEOUS) ×1 IMPLANT
ELECTRODE REM PT RTRN 9FT ADLT (ELECTROSURGICAL) ×1 IMPLANT
GAUZE SPONGE 4X4 12PLY STRL (GAUZE/BANDAGES/DRESSINGS) ×3 IMPLANT
GLOVE BIOGEL PI ORTHO PRO 7.5 (GLOVE) ×2
GLOVE BIOGEL PI ORTHO PRO SZ8 (GLOVE) ×2
GLOVE ORTHO TXT STRL SZ7.5 (GLOVE) ×3 IMPLANT
GLOVE PI ORTHO PRO STRL 7.5 (GLOVE) ×1 IMPLANT
GLOVE PI ORTHO PRO STRL SZ8 (GLOVE) ×1 IMPLANT
GLOVE SURG ORTHO 8.5 STRL (GLOVE) ×3 IMPLANT
GOWN STRL REUS W/ TWL LRG LVL3 (GOWN DISPOSABLE) ×1 IMPLANT
GOWN STRL REUS W/ TWL XL LVL3 (GOWN DISPOSABLE) ×2 IMPLANT
GOWN STRL REUS W/TWL LRG LVL3 (GOWN DISPOSABLE) ×3
GOWN STRL REUS W/TWL XL LVL3 (GOWN DISPOSABLE) ×4
HANDPIECE INTERPULSE COAX TIP (DISPOSABLE)
KIT BASIN OR (CUSTOM PROCEDURE TRAY) ×3 IMPLANT
KIT ROOM TURNOVER OR (KITS) ×3 IMPLANT
MANIFOLD NEPTUNE II (INSTRUMENTS) ×3 IMPLANT
NEEDLE 1/2 CIR MAYO (NEEDLE) ×3 IMPLANT
NEEDLE HYPO 25GX1X1/2 BEV (NEEDLE) ×3 IMPLANT
NS IRRIG 1000ML POUR BTL (IV SOLUTION) ×3 IMPLANT
PACK SHOULDER (CUSTOM PROCEDURE TRAY) ×3 IMPLANT
PAD ARMBOARD 7.5X6 YLW CONV (MISCELLANEOUS) ×6 IMPLANT
PIN METAGLENE 2.5 (PIN) ×6 IMPLANT
SET HNDPC FAN SPRY TIP SCT (DISPOSABLE) IMPLANT
SLING ARM FOAM STRAP LRG (SOFTGOODS) ×3 IMPLANT
SLING ARM LRG ADULT FOAM STRAP (SOFTGOODS) ×3 IMPLANT
SLING ARM MED ADULT FOAM STRAP (SOFTGOODS) IMPLANT
SPONGE LAP 18X18 X RAY DECT (DISPOSABLE) IMPLANT
SPONGE LAP 4X18 X RAY DECT (DISPOSABLE) ×3 IMPLANT
STRIP CLOSURE SKIN 1/2X4 (GAUZE/BANDAGES/DRESSINGS) ×2 IMPLANT
SUCTION FRAZIER HANDLE 10FR (MISCELLANEOUS) ×2
SUCTION TUBE FRAZIER 10FR DISP (MISCELLANEOUS) ×1 IMPLANT
SUT FIBERWIRE #2 38 T-5 BLUE (SUTURE) ×6
SUT MNCRL AB 4-0 PS2 18 (SUTURE) ×3 IMPLANT
SUT VIC AB 0 CT1 27 (SUTURE) ×3
SUT VIC AB 0 CT1 27XBRD ANBCTR (SUTURE) ×1 IMPLANT
SUT VIC AB 0 CT2 27 (SUTURE) ×3 IMPLANT
SUT VIC AB 2-0 CT1 27 (SUTURE) ×3
SUT VIC AB 2-0 CT1 TAPERPNT 27 (SUTURE) ×1 IMPLANT
SUT VICRYL 0 CT 1 36IN (SUTURE) IMPLANT
SUTURE FIBERWR #2 38 T-5 BLUE (SUTURE) ×2 IMPLANT
SYR CONTROL 10ML LL (SYRINGE) ×3 IMPLANT
TAPE CLOTH SURG 6X10 WHT LF (GAUZE/BANDAGES/DRESSINGS) ×3 IMPLANT
TOWEL OR 17X24 6PK STRL BLUE (TOWEL DISPOSABLE) ×3 IMPLANT
TOWEL OR 17X26 10 PK STRL BLUE (TOWEL DISPOSABLE) ×3 IMPLANT
TOWER CARTRIDGE SMART MIX (DISPOSABLE) IMPLANT
TRAY FOLEY CATH 16FRSI W/METER (SET/KITS/TRAYS/PACK) IMPLANT
WATER STERILE IRR 1000ML POUR (IV SOLUTION) IMPLANT
YANKAUER SUCT BULB TIP NO VENT (SUCTIONS) ×3 IMPLANT

## 2016-06-04 NOTE — Progress Notes (Signed)
Patient successfully voided after coming back from surgery. Patient assisted with stand by assist to bathroom.

## 2016-06-04 NOTE — Anesthesia Postprocedure Evaluation (Signed)
Anesthesia Post Note  Patient: [name] [name]  Procedure(s) Performed: Procedure(s) (LRB): REVERSE SHOULDER ARTHROPLASTY (Right)  Patient location during evaluation: PACU Anesthesia Type: General and Regional Level of consciousness: awake and alert Pain management: pain level controlled Vital Signs Assessment: post-procedure vital signs reviewed and stable Respiratory status: spontaneous breathing, nonlabored ventilation, respiratory function stable and patient connected to nasal cannula oxygen Cardiovascular status: blood pressure returned to baseline and stable Postop Assessment: no signs of nausea or vomiting Anesthetic complications: no    Last Vitals:  Vitals:   06/04/16 1215 06/04/16 1230  BP: 105/82 115/82  Pulse:    Resp: 16   Temp:      Last Pain:  Vitals:   06/04/16 1200  TempSrc:   PainSc: [place]

## 2016-06-04 NOTE — Interval H&P Note (Signed)
History and Physical Interval Note:  06/04/2016 9:11 AM  [name] [name]  has presented today for surgery, with the diagnosis of RIGHT SHOULDER OA  The various methods of treatment have been discussed with the patient and family. After consideration of risks, benefits and other options for treatment, the patient has consented to  Procedure(s): REVERSE SHOULDER ARTHROPLASTY (Right) as a surgical intervention .  The patient's history has been reviewed, patient examined, no change in status, stable for surgery.  I have reviewed the patient's chart and labs.  Questions were answered to the patient's satisfaction.     Lawrnce Reyez,STEVEN R

## 2016-06-04 NOTE — Anesthesia Procedure Notes (Signed)
Anesthesia Regional Block:  Interscalene brachial plexus block  Pre-Anesthetic Checklist: ,, timeout performed, Correct Patient, Correct Site, Correct Laterality, Correct Procedure, Correct Position, site marked, Risks and benefits discussed,  Surgical consent,  Pre-op evaluation,  At surgeon's request and post-op pain management  Laterality: Right  Prep: chloraprep       Needles:  Injection technique: Single-shot  Needle Type: Echogenic Stimulator Needle     Needle Length: 5cm 5 cm Needle Gauge: 22 and 22 G    Additional Needles:  Procedures: ultrasound guided (picture in chart) Interscalene brachial plexus block Narrative:  Start time: 06/04/2016 9:05 AM End time: 06/04/2016 9:09 AM Injection made incrementally with aspirations every 5 mL.  Performed by: Personally  Anesthesiologist: Catalina Gravel  Additional Notes: Functioning IV was confirmed and monitors were applied.  A 65mm 22ga Arrow echogenic stimulator needle was used. Sterile prep and drape, hand hygiene, and sterile gloves were used.  Negative aspiration and negative test dose prior to incremental administration of local anesthetic. The patient tolerated the procedure well.  Ultrasound guidance: relevent anatomy identified, needle position confirmed, local anesthetic spread visualized around nerve(s), vascular puncture avoided.  Image printed for medical record.

## 2016-06-04 NOTE — Transfer of Care (Signed)
Immediate Anesthesia Transfer of Care Note  Patient: [name] [name]  Procedure(s) Performed: Procedure(s): REVERSE SHOULDER ARTHROPLASTY (Right)  Patient Location: PACU  Anesthesia Type:GA combined with regional for post-op pain  Level of Consciousness: awake, alert  and oriented  Airway & Oxygen Therapy: Patient Spontanous Breathing and Patient connected to nasal cannula oxygen  Post-op Assessment: Report given to RN, Post -op Vital signs reviewed and stable and Patient moving all extremities  Post vital signs: Reviewed and stable  Last Vitals:  Vitals:   06/04/16 0821  BP: (!) 146/101  Pulse: 74  Resp: 20  Temp: 36.5 C    Last Pain:  Vitals:   06/04/16 0821  TempSrc: Oral         Complications: No apparent anesthesia complications

## 2016-06-04 NOTE — Discharge Instructions (Signed)
Ice to the shoulder as much as possible.  Keep a pillow propped behind the right elbow to keep your arm across your waist.  May remove the sling and use the arm for light activities of daily living.  DO NOT push up with your full weigh out of the chair.  Keep the incision clean and dry and covered for one week, then ok to shower.  Follow up in the office in two weeks, call [phone]

## 2016-06-04 NOTE — Anesthesia Procedure Notes (Signed)
Procedure Name: Intubation Date/Time: 06/04/2016 9:42 AM Performed by: Kyung Rudd Pre-anesthesia Checklist: Patient identified, Emergency Drugs available, Suction available and Patient being monitored Patient Re-evaluated:Patient Re-evaluated prior to inductionOxygen Delivery Method: Circle system utilized Preoxygenation: Pre-oxygenation with 100% oxygen Intubation Type: IV induction Ventilation: Mask ventilation without difficulty and Oral airway inserted - appropriate to patient size Laryngoscope Size: Mac and 4 Grade View: Grade IV Tube type: Oral Tube size: 7.5 mm Number of attempts: 1 Airway Equipment and Method: Bougie stylet Placement Confirmation: positive ETCO2 and breath sounds checked- equal and bilateral Secured at: 22 cm Dental Injury: Teeth and Oropharynx as per pre-operative assessment

## 2016-06-04 NOTE — Anesthesia Preprocedure Evaluation (Addendum)
Anesthesia Evaluation  Patient identified by MRN, date of birth, ID band Patient awake    Reviewed: Allergy & Precautions, NPO status , Patient's Chart, lab work & pertinent test results  Airway Mallampati: II  TM Distance: >3 FB Neck ROM: Full    Dental  (+) Teeth Intact, Dental Advisory Given, Missing,    Pulmonary neg pulmonary ROS,    Pulmonary exam normal breath sounds clear to auscultation       Cardiovascular hypertension, (-) angina+ Peripheral Vascular Disease  (-) CAD, (-) Past MI and (-) CHF Normal cardiovascular exam Rhythm:Regular Rate:Normal     Neuro/Psych  Neuromuscular disease negative psych ROS   GI/Hepatic negative GI ROS, Neg liver ROS,   Endo/Other  negative endocrine ROS  Renal/GU negative Renal ROS     Musculoskeletal  (+) Arthritis , Osteoarthritis,    Abdominal   Peds  Hematology negative hematology ROS (+)   Anesthesia Other Findings Day of surgery medications reviewed with the patient.  Reproductive/Obstetrics                           Anesthesia Physical Anesthesia Plan  ASA: III  Anesthesia Plan: General   Post-op Pain Management: GA combined w/ Regional for post-op pain   Induction: Intravenous  Airway Management Planned: Oral ETT  Additional Equipment:   Intra-op Plan:   Post-operative Plan: Extubation in OR  Informed Consent: I have reviewed the patients History and Physical, chart, labs and discussed the procedure including the risks, benefits and alternatives for the proposed anesthesia with the patient or authorized representative who has indicated his/her understanding and acceptance.   Dental advisory given  Plan Discussed with: CRNA  Anesthesia Plan Comments: (Risks/benefits of general anesthesia discussed with patient including risk of damage to teeth, lips, gum, and tongue, nausea/vomiting, allergic reactions to medications, and the  possibility of heart attack, stroke and death.  All patient questions answered.  Patient wishes to proceed.  Discussed risks and benefits of interscalene block including failure, bleeding, infection, nerve damage, weakness, shortness of breath, pneumothorax. Questions answered. Patient consents to block. )       Anesthesia Quick Evaluation

## 2016-06-04 NOTE — Brief Op Note (Signed)
06/04/2016  11:39 AM  PATIENT:  [name]  82 y.o. male  PRE-OPERATIVE DIAGNOSIS:  RIGHT SHOULDER OA, END STAGE, ROTATOR CUFF TEAR ARTHROPATHY  POST-OPERATIVE DIAGNOSIS:  RIGHT SHOULDER OA, END STAGE, ROTATOR CUFF TEAR ARTHROPATHY  PROCEDURE:  Procedure(s): REVERSE SHOULDER ARTHROPLASTY (Right) DePuy Delta Xtend  SURGEON:  Surgeon(s) and Role:    * Netta Cedars, MD - Primary  PHYSICIAN ASSISTANT:   ASSISTANTS: Ventura Bruns, PA-C   ANESTHESIA:   regional and general  EBL:  Total I/O In: 800 [I.V.:800] Out: 150 [Blood:150]  BLOOD ADMINISTERED:none  DRAINS: none   LOCAL MEDICATIONS USED:  MARCAINE     SPECIMEN:  No Specimen  DISPOSITION OF SPECIMEN:  N/A  COUNTS:  YES  TOURNIQUET:  * No tourniquets in log *  DICTATION: .Other Dictation: Dictation Number [id number]  PLAN OF CARE: Admit to inpatient   PATIENT DISPOSITION:  PACU - hemodynamically stable.   Delay start of Pharmacological VTE agent (>24hrs) due to surgical blood loss or risk of bleeding: no

## 2016-06-05 LAB — HEMOGLOBIN AND HEMATOCRIT, BLOOD
HCT: 35 % — ABNORMAL LOW (ref 39.0–52.0)
Hemoglobin: 11.8 g/dL — ABNORMAL LOW (ref 13.0–17.0)

## 2016-06-05 LAB — BASIC METABOLIC PANEL
Anion gap: 10 (ref 5–15)
BUN: 12 mg/dL (ref 6–20)
CO2: 23 mmol/L (ref 22–32)
Calcium: 8.4 mg/dL — ABNORMAL LOW (ref 8.9–10.3)
Chloride: 101 mmol/L (ref 101–111)
Creatinine, Ser: 1.15 mg/dL (ref 0.61–1.24)
GFR calc Af Amer: >60 mL/min (ref >60)
GFR calc non Af Amer: 56 mL/min — ABNORMAL LOW (ref >60)
Glucose, Bld: 171 mg/dL — ABNORMAL HIGH (ref 65–99)
Potassium: 4.2 mmol/L (ref 3.5–5.1)
Sodium: 134 mmol/L — ABNORMAL LOW (ref 135–145)

## 2016-06-05 NOTE — Progress Notes (Signed)
Patient and patient's family friend both verbalized understanding of discharge instructions are able to teach back information. IV previously removed by night shift nurse. Patient to leave with sling in place. RN provided patient with ice bags for home use.

## 2016-06-05 NOTE — Discharge Summary (Signed)
Physician Discharge Summary   Patient ID: [name] [name] MRN: [id number] DOB/AGE: 1928-05-28 82 y.o.  Admit date: 06/04/2016 Discharge date: 06/05/2016  Admission Diagnoses:  Active Problems:   S/P shoulder replacement, right   Discharge Diagnoses:  Same   Surgeries: Procedure(s): REVERSE SHOULDER ARTHROPLASTY on 06/04/2016   Consultants: OT  Discharged Condition: Stable  Hospital Course: [name] [name] is an 82 y.o. male who was admitted 06/04/2016 with a chief complaint of right shoulder pain, and found to have a diagnosis of right shoulder rotator cuff tear arthropathy.  They were brought to the operating room on 06/04/2016 and underwent the above named procedures.    The patient had an uncomplicated hospital course and was stable for discharge.  Recent vital signs:  Vitals:   06/04/16 2005 06/05/16 0032  BP: 110/68 100/60  Pulse: 76 67  Resp: 16   Temp: 98.2 F (36.8 C) 99.2 F (37.3 C)    Recent laboratory studies:  Results for orders placed or performed during the hospital encounter of 06/04/16  Hemoglobin and hematocrit, blood  Result Value Ref Range   Hemoglobin 11.8 (L) 13.0 - 17.0 g/dL   HCT 35.0 (L) 39.0 - 35.4 %  Basic metabolic panel  Result Value Ref Range   Sodium 134 (L) 135 - 145 mmol/L   Potassium 4.2 3.5 - 5.1 mmol/L   Chloride 101 101 - 111 mmol/L   CO2 23 22 - 32 mmol/L   Glucose, Bld 171 (H) 65 - 99 mg/dL   BUN 12 6 - 20 mg/dL   Creatinine, Ser 1.15 0.61 - 1.24 mg/dL   Calcium 8.4 (L) 8.9 - 10.3 mg/dL   GFR calc non Af Amer 56 (L) >60 mL/min   GFR calc Af Amer >60 >60 mL/min   Anion gap 10 5 - 15    Discharge Medications:     Medication List    STOP taking these medications   OVER THE COUNTER MEDICATION     TAKE these medications   aspirin 81 MG tablet Take 81 mg by mouth at bedtime.   b complex vitamins tablet Take 1 tablet by mouth daily.   dutasteride 0.5 MG capsule Commonly known as:  AVODART Take 0.5 mg by mouth daily.   FISH OIL PO Take 1 capsule by mouth daily.   folic acid 656 MCG tablet Commonly known as:  FOLVITE Take 800 mcg by mouth daily.   HYDROcodone-acetaminophen 5-325 MG tablet Commonly known as:  NORCO Take 1 tablet by mouth every 6 (six) hours as needed for severe pain.   MELATONIN PO Take 1 tablet by mouth at bedtime.   multivitamin-lutein Caps capsule Take 1 capsule by mouth daily.   CENTRUM SILVER PO Take 1 tablet by mouth daily.   NON FORMULARY   saw palmetto 160 MG capsule Take 160 mg by mouth 2 (two) times daily.   sulfaSALAzine 500 MG tablet Commonly known as:  AZULFIDINE Take 2,000 mg by mouth daily.   tamsulosin 0.4 MG Caps capsule Commonly known as:  FLOMAX Take 0.4 mg by mouth 2 (two) times daily.   traMADol 50 MG tablet Commonly known as:  ULTRAM Take 1-2 tablets (50-100 mg total) by mouth every 6 (six) hours as needed for moderate pain.       Diagnostic Studies: Dg Shoulder Right Port  Result Date: 06/04/2016 CLINICAL DATA:  Post Right Total Shoulder Replacement. EXAM: PORTABLE RIGHT SHOULDER COMPARISON:  02/09/2016 FINDINGS: Status post reverse shoulder arthroplasty. Postoperative gas is identified. No evidence for  interval fracture or dislocation. Visualized portion of the right lung apex is unremarkable. IMPRESSION: No adverse features following reverse shoulder arthroplasty. Electronically Signed   By: Nolon Nations M.D.   On: 06/04/2016 12:45    Disposition: 01-Home or Self Care    Follow-up Information    Tarea Skillman,STEVEN R, MD. Call in 2 weeks.   Specialty:  Orthopedic Surgery Why:  [phone] Contact information: [address] [place] [address] [phone]            Signed: Augustin Schooling 06/05/2016, 7:58 AM

## 2016-06-05 NOTE — Evaluation (Signed)
Occupational Therapy Evaluation Patient Details Name: [name] [name] MRN: [id number] DOB: March 03, 1928 Today's Date: 06/05/2016    History of Present Illness Pt REVERSE SHOULDER ARTHROPLASTY. PMH includes, but not limited to, previous L and R TKA   Clinical Impression   Pt admitted with the above diagnoses and presents with below problem list. Pt will benefit from continued acute OT to address the below listed deficits and maximize independence with basic ADLs prior to d/c home with spouse assisting. PTA pt was independent with ADLs and continues to work part-time. Pt is currently min to mod A with most ADLs and mobility. Pt with unsteadiness during transfers and functional mobility, wide BOS and seeking external support. ADL, sling, and ROM exercises reviewed with pt as detailed below. Pt appearing overwhelmed at times by information and instructions. Provided handout and reinforced key points. Cueing needed at times for RUE precautions. Would benefit from [facility]. Family not present during session.      Follow Up Recommendations  Home health OT;Supervision/Assistance - 24 hour    Equipment Recommendations  3 in 1 bedside commode    Recommendations for Other Services PT consult     Precautions / Restrictions Precautions Precautions: Shoulder;Fall Type of Shoulder Precautions: active protocol Shoulder Interventions: Shoulder sling/immobilizer;For comfort (for sleep) Precaution Booklet Issued: Yes (comment) Precaution Comments: reviewed Required Braces or Orthoses: Sling Restrictions Weight Bearing Restrictions: Yes RUE Weight Bearing: Non weight bearing      Mobility Bed Mobility Overal bed mobility: Needs Assistance Bed Mobility: Sit to Supine       Sit to supine: Min guard;Min assist;HOB elevated   General bed mobility comments: HOB partially elevated. used bed rails. good scooting ability. Pt plans to sleep in recliner at home.   Transfers Overall transfer level: Needs  assistance Equipment used: 1 person hand held assist;None Transfers: Sit to/from Stand Sit to Stand: Min assist         General transfer comment: min A to steady balance. from recliner, 3n1 over toilet, and EOB    Balance Overall balance assessment: Needs assistance Sitting-balance support: No upper extremity supported;Feet supported Sitting balance-Leahy Scale: Good     Standing balance support: Single extremity supported;No upper extremity supported Standing balance-Leahy Scale: Fair Standing balance comment: seeking external support during transfers/dynamic standing tasks and walking in room                            ADL Overall ADL's : Needs assistance/impaired Eating/Feeding: Set up;Sitting   Grooming: Moderate assistance;Sitting   Upper Body Bathing: Moderate assistance;Sitting   Lower Body Bathing: Moderate assistance;Sit to/from stand   Upper Body Dressing : Moderate assistance;Sitting   Lower Body Dressing: Moderate assistance;Sit to/from stand   Toilet Transfer: Minimal assistance;Ambulation (3n1 over toilet)   Toileting- Clothing Manipulation and Hygiene: Moderate assistance;Sit to/from stand   Tub/ Shower Transfer: Tub transfer;Moderate assistance;Ambulation;3 in 1   Functional mobility during ADLs: Min guard;Minimal assistance General ADL Comments: Assist to steady balance during dynamic standing tasks. Pt completed toilet transfer and bed mobility as detailed above. Pt seeking external support     Vision     Perception     Praxis      Pertinent Vitals/Pain Pain Assessment: 0-10 Pain Score: 7  Pain Location: R shoulder Pain Descriptors / Indicators: Aching;Sore Pain Intervention(s): Limited activity within patient's tolerance;Monitored during session;Repositioned;Patient requesting pain meds-RN notified;RN gave pain meds during session     Hand Dominance Right   Extremity/Trunk  Assessment Upper Extremity Assessment Upper  Extremity Assessment: RUE deficits/detail RUE Deficits / Details: s/p reverse shoulder arthoplasty RUE: Unable to fully assess due to pain   Lower Extremity Assessment Lower Extremity Assessment: Defer to PT evaluation       Communication Communication Communication: HOH   Cognition Arousal/Alertness: Awake/alert Behavior During Therapy: WFL for tasks assessed/performed Overall Cognitive Status: No family/caregiver present to determine baseline cognitive functioning                 General Comments: possible decreased STM?    General Comments       Exercises  R e/w/h AROM 5-10 reps; R Shoulder PROM/AAROM FF 20* 3 reps, IR 20* 5 reps, Abd 45* 10 reps. All exercises in supine.     Shoulder Instructions      Home Living Family/patient expects to be discharged to:: Private residence Living Arrangements: Spouse/significant other Available Help at Discharge: Family;Available 24 hours/day Type of Home: House Home Access: Stairs to enter [employer] of Steps: 2 steps Entrance Stairs-Rails: None Home Layout: Two level;Able to live on main level with bedroom/bathroom     Bathroom Shower/Tub: Tub/shower unit;Curtain Shower/tub characteristics: Architectural technologist: Handicapped height     Home Equipment: Environmental consultant - 2 wheels;Cane - quad;Grab bars - tub/shower;Hand held shower head          Prior Functioning/Environment Level of Independence: Independent        Comments: works part-time        OT Problem List: Impaired balance (sitting and/or standing);Decreased knowledge of use of DME or AE;Decreased knowledge of precautions;Decreased cognition;Impaired UE functional use;Pain;Decreased safety awareness   OT Treatment/Interventions: Self-care/ADL training;Therapeutic exercise;DME and/or AE instruction;Therapeutic activities;Patient/family education;Balance training    OT Goals(Current goals can be found in the care plan section) Acute Rehab OT  Goals Patient Stated Goal: not stated. Plans to travel to [place] to visit family in early January. OT Goal Formulation: With patient Time For Goal Achievement: 06/12/16 Potential to Achieve Goals: Good ADL Goals Pt Will Perform Grooming: sitting;with modified independence Pt Will Perform Upper Body Bathing: with supervision;sitting Pt Will Perform Lower Body Bathing: with supervision;sit to/from stand Pt Will Perform Upper Body Dressing: with modified independence;sitting Pt Will Perform Lower Body Dressing: with min guard assist;sit to/from stand Pt Will Transfer to Toilet: with min guard assist;ambulating Pt Will Perform Toileting - Clothing Manipulation and hygiene: with min guard assist;sit to/from stand Pt Will Perform Tub/Shower Transfer: Tub transfer;with min guard assist;ambulating;3 in 1 Pt/caregiver will Perform Home Exercise Program: Right Upper extremity;With written HEP provided (active protocol)  OT Frequency: Min 3X/week   Barriers to D/C:    Pt reports wife can assist at d/c "She works out at [employer]." Spouse not present during session.        Co-evaluation              End of Session Equipment Utilized During Treatment: Gait belt;Other (comment) (sling) Nurse Communication: Mobility status;Other (comment) (recommending HHOT; cognition)  Activity Tolerance: Patient limited by pain;Patient tolerated treatment well Patient left: in bed;with call bell/phone within reach;with bed alarm set   Time: 0902-0950 OT Time Calculation (min): 48 min Charges:  OT General Charges $OT Visit: 1 Procedure OT Evaluation $OT Eval Low Complexity: 1 Procedure OT Treatments $Self Care/Home Management : 8-22 mins $Therapeutic Exercise: 8-22 mins G-Codes:    Hortencia Pilar 06/13/16, 10:16 AM

## 2016-06-05 NOTE — Care Management Note (Signed)
Case Management Note  Patient Details  Name: Galdino Hinchman MRN: [id number] Date of Birth: 01/21/1928  Subjective/Objective:   82 yr old male s/p right total shoulder arthroplasty.    Action/Plan: Case manager spoke with patient concerning Home Health needs. Referral was called to Melene Muller, [place] Liaison. Patient will have family support at discharge. No further needs identified.   Expected Discharge Date:    06/05/16              Expected Discharge Plan:  [place]  In-House Referral:  NA  Discharge planning Services  CM Consult  Post Acute Care Choice:  Home Health Choice offered to:  Patient  DME Arranged:  N/A DME Agency:  NA  HH Arranged:  OT, PT [place] Agency:  [place]  Status of Service:  Completed, signed off  If discussed at [place] of Stay Meetings, dates discussed:    Additional Comments:  Ninfa Meeker, RN 06/05/2016, 1:58 PM

## 2016-06-05 NOTE — Evaluation (Signed)
Physical Therapy Evaluation Patient Details Name: [name] [name] MRN: [id number] DOB: April 14, 1928 Today's Date: 06/05/2016   History of Present Illness  Pt is an 82 y/o male s/p REVERSE SHOULDER ARTHROPLASTY. PMH includes, but not limited to, previous L and R TKA  Clinical Impression  Pt presented supine in bed with HOB elevated, awake and willing to participate in therapy session. Prior to admission, pt reported that he was independent with all functional mobility and ADLs. Pt continues to work part-time. Pt moving well during evaluation with min guard for safety with bed mobility, transfers and ambulation. Pt required min A with ascending and descending stairs without use of hand rails as he has none at home. Pt would continue to benefit from skilled physical therapy services at this time while admitted and after d/c to address his below listed limitations in order to improve his overall safety and independence with functional mobility.     Follow Up Recommendations Home health PT;Other (comment) [facility] PT evaluation for safety)    Equipment Recommendations  None recommended by PT    Recommendations for Other Services       Precautions / Restrictions Precautions Precautions: Shoulder;Fall Type of Shoulder Precautions: active protocol Shoulder Interventions: Shoulder sling/immobilizer;For comfort (for sleep) Precaution Booklet Issued: Yes (comment) Precaution Comments: reviewed Required Braces or Orthoses: Sling Restrictions Weight Bearing Restrictions: Yes RUE Weight Bearing: Non weight bearing      Mobility  Bed Mobility Overal bed mobility: Needs Assistance Bed Mobility: Supine to Sit;Sit to Supine     Supine to sit: Min guard;HOB elevated Sit to supine: Min guard   General bed mobility comments: pt required increased time, use of bed rails, min guard for safety  Transfers Overall transfer level: Needs assistance Equipment used: None Transfers: Sit to/from Stand Sit to  Stand: Min guard         General transfer comment: pt required increased time, L UE on bed rail and min guard for safety  Ambulation/Gait Ambulation/Gait assistance: Min guard Ambulation Distance (Feet): 75 Feet (75' x2 with stair training in between) Assistive device: None Gait Pattern/deviations: Step-through pattern;Decreased stride length Gait velocity: decreased   General Gait Details: pt frequently reaching for stable surfaces to hold onto with L UE; however, refusing 1HHA from therapist. No instability or LOB  Stairs Stairs: Yes   Stair Management: No rails;Step to pattern;Forwards Number of Stairs: 2 General stair comments: pt using L UE on therapist's forearm to ascend and on therapist's shoulder to descend. pt's wife was present throughout stair training  Wheelchair Mobility    Modified Rankin (Stroke Patients Only)       Balance Overall balance assessment: Needs assistance Sitting-balance support: Feet supported;No upper extremity supported Sitting balance-Leahy Scale: Good     Standing balance support: During functional activity;No upper extremity supported Standing balance-Leahy Scale: Fair Standing balance comment: pt able to stand statically without UE supports with min guard for safety                             Pertinent Vitals/Pain Pain Assessment: No/denies pain Pain Score: 7  Pain Location: R shoulder Pain Descriptors / Indicators: Aching;Sore Pain Intervention(s): Monitored during session    Home Living Family/patient expects to be discharged to:: Private residence Living Arrangements: Spouse/significant other Available Help at Discharge: Family;Available 24 hours/day Type of Home: House Home Access: Stairs to enter Entrance Stairs-Rails: None Entrance Stairs-Number of Steps: 2 steps Home Layout: Two level;Able to live  on main level with bedroom/bathroom Home Equipment: Walker - 2 wheels;Cane - quad;Grab bars - tub/shower;Hand  held shower head      Prior Function Level of Independence: Independent         Comments: works part-time     Journalist, newspaper   Dominant Hand: Right    Extremity/Trunk Assessment   Upper Extremity Assessment: Defer to OT evaluation RUE Deficits / Details: s/p reverse shoulder arthoplasty RUE: Unable to fully assess due to pain       Lower Extremity Assessment: Overall WFL for tasks assessed         Communication   Communication: HOH  Cognition Arousal/Alertness: Awake/alert Behavior During Therapy: WFL for tasks assessed/performed Overall Cognitive Status: Within Functional Limits for tasks assessed                 General Comments: possible decreased STM?     General Comments      Exercises     Assessment/Plan    PT Assessment Patient needs continued PT services  PT Problem List Decreased balance;Decreased mobility;Decreased coordination;Decreased knowledge of use of DME;Decreased safety awareness;Pain          PT Treatment Interventions DME instruction;Gait training;Functional mobility training;Therapeutic activities;Stair training;Therapeutic exercise;Balance training;Patient/family education;Neuromuscular re-education    PT Goals (Current goals can be found in the Care Plan section)  Acute Rehab PT Goals Patient Stated Goal: return home today PT Goal Formulation: With patient/family Time For Goal Achievement: 06/12/16 Potential to Achieve Goals: Good    Frequency Min 3X/week   Barriers to discharge        Co-evaluation               End of Session Equipment Utilized During Treatment: Gait belt Activity Tolerance: Patient tolerated treatment well Patient left: in bed;with call bell/phone within reach;with bed alarm set;with family/visitor present Nurse Communication: Mobility status         Time: 1011-1028 PT Time Calculation (min) (ACUTE ONLY): 17 min   Charges:   PT Evaluation $PT Eval Low Complexity: 1 Procedure      PT G CodesClearnce Sorrel Garris Melhorn 06/05/2016, 10:48 AM Sherie Don, PT, DPT [phone]

## 2016-06-05 NOTE — Progress Notes (Signed)
Orthopedics Progress Note  Subjective: Painful last night, better this morning.  He is ready for therapy  Objective:  Vitals:   06/04/16 2005 06/05/16 0032  BP: 110/68 100/60  Pulse: 76 67  Resp: 16   Temp: 98.2 F (36.8 C) 99.2 F (37.3 C)    General: Awake and alert  Musculoskeletal: right shoulder wound looks good, bandage changed Neurovascularly intact  Lab Results  Component Value Date   WBC 7.6 05/26/2016   HGB 11.8 (L) 06/05/2016   HCT 35.0 (L) 06/05/2016   MCV 93.1 05/26/2016   PLT 167 05/26/2016       Component Value Date/Time   NA 134 (L) 06/05/2016 0442   K 4.2 06/05/2016 0442   CL 101 06/05/2016 0442   CO2 23 06/05/2016 0442   GLUCOSE 171 (H) 06/05/2016 0442   BUN 12 06/05/2016 0442   CREATININE 1.15 06/05/2016 0442   CALCIUM 8.4 (L) 06/05/2016 0442   GFRNONAA 56 (L) 06/05/2016 0442   GFRAA >60 06/05/2016 0442    Lab Results  Component Value Date   INR 1.02 04/04/2013   INR 2.15 (H) 11/13/2009   INR 1.49 11/12/2009    Assessment/Plan: POD #1 s/p Procedure(s): REVERSE SHOULDER ARTHROPLASTY OT this morning, then discharge to home midday Follow up with Dr Veverly Fells in two weeks  Doran Heater. Veverly Fells, MD 06/05/2016 7:56 AM

## 2016-06-07 ENCOUNTER — Encounter (HOSPITAL_COMMUNITY): Payer: Self-pay | Admitting: Orthopedic Surgery

## 2016-06-07 DIAGNOSIS — N312 Flaccid neuropathic bladder, not elsewhere classified: Secondary | ICD-10-CM | POA: Diagnosis not present

## 2016-06-07 DIAGNOSIS — I10 Essential (primary) hypertension: Secondary | ICD-10-CM | POA: Diagnosis not present

## 2016-06-07 DIAGNOSIS — Z471 Aftercare following joint replacement surgery: Secondary | ICD-10-CM | POA: Diagnosis not present

## 2016-06-07 DIAGNOSIS — Z96652 Presence of left artificial knee joint: Secondary | ICD-10-CM | POA: Diagnosis not present

## 2016-06-07 DIAGNOSIS — G250 Essential tremor: Secondary | ICD-10-CM | POA: Diagnosis not present

## 2016-06-07 DIAGNOSIS — Z96611 Presence of right artificial shoulder joint: Secondary | ICD-10-CM | POA: Diagnosis not present

## 2016-06-07 DIAGNOSIS — N139 Obstructive and reflux uropathy, unspecified: Secondary | ICD-10-CM | POA: Diagnosis not present

## 2016-06-07 DIAGNOSIS — G629 Polyneuropathy, unspecified: Secondary | ICD-10-CM | POA: Diagnosis not present

## 2016-06-07 DIAGNOSIS — Z8546 Personal history of malignant neoplasm of prostate: Secondary | ICD-10-CM | POA: Diagnosis not present

## 2016-06-07 DIAGNOSIS — Z7982 Long term (current) use of aspirin: Secondary | ICD-10-CM | POA: Diagnosis not present

## 2016-06-07 NOTE — Op Note (Signed)
[name] [name], [name] [name]               ACCOUNT NO.:  [id number]  MEDICAL RECORD NO.:  [id number]  LOCATION:                                 FACILITY:  PHYSICIAN:  Doran Heater. Veverly Fells, M.D. DATE OF BIRTH:  [birth year]  DATE OF PROCEDURE:  06/04/2016 DATE OF DISCHARGE:  06/05/2016                              OPERATIVE REPORT   PREOPERATIVE DIAGNOSES:  Right shoulder end-stage arthritis/rotator cuff tear arthropathy.  POSTOPERATIVE DIAGNOSIS:  Right shoulder end-stage arthritis/rotator cuff tear arthropathy.  PROCEDURE PERFORMED:  Right shoulder reverse total shoulder arthroplasty using DePuy Delta Xtend prosthesis.  ATTENDING SURGEON:  Doran Heater. Veverly Fells, M.D.  ASSISTANT:  Abbott Pao. Dixon, PAC, who scrubbed the entire procedure and necessary for satisfactory completion of surgery.  General anesthesia was used plus interscalene block.  Estimated blood loss was 150 mL.  FLUID REPLACEMENT:  1500 mL crystalloid.  INSTRUMENT COUNTS:  Correct.  There were no complications.  Perioperative antibiotics were given.  INDICATIONS:  The patient is an 82 year old male with worsening right shoulder function and pain secondary to rotator cuff tear arthropathy. The patient has had progressive pain despite conservative management and presents desiring total shoulder arthroplasty to relieve pain and restore function in the shoulder.  Informed consent obtained.  DESCRIPTION OF THE PROCEDURE:  After an adequate level of anesthesia was achieved, the patient was positioned in the modified beach-chair position.  Right shoulder correctly identified and sterilely prepped and draped in the usual manner.  Time-out called.  We used a standard deltopectoral incision started at the coracoid process extending down to the anterior humerus.  Dissection down through subcutaneous tissues using Bovie, identified cephalic vein, took it laterally with the deltoid, pectoralis taken medially.  Conjoined tendon  identified and retracted medially.  We tenodesed the biceps tendon suturing with 0 Vicryl figure-of-eight suture through the biceps and the pec insertion on the humerus x2.  So, it was an in situ tenodesis.  We then released the subscapularis remnant off the lesser tuberosity tagging for protection of the axillary nerve.  We did release of the capsule off the inferior humeral neck with progressive external rotation.  Advanced arthritis noted and severe synovitis.  We then released the biceps tendon at the joint line and then went ahead and delivered the humeral head out of the wound.  There was no evidence of remaining supraspinatus or infraspinatus and teres minor was diminutive.  At this point, we entered the proximal humerus using a 6 mm reamer and reamed up to a size #14 diameter.  We then placed the #14 intramedullary guide and resected the head 10 degrees in retroversion for the DePuy Delta Xtend prosthesis.  Once we had the head off, we did remove excess osteophytes with a large rongeur working around to the posterior aspect, and then, we milled for the metaphyseal portion of the component with the epi-2 right set on 10 degrees of retroversion.  Once we had that milled, we impacted the trial component in place, a 14 body epi-2 right, set on the 0 setting, and placed in 10 degrees of retroversion and impacted with good bony coverage.  We reduced the shoulder and subluxed it posteriorly, removed the  biceps stump, the remnant of the rotator cuff. We also removed the capsule and the labrum back to where we could really visualize the glenoid face well.  We placed a guide pin and reamed for the metaglene and then drilled our central peg hole and impacted the metaglene into position, placed a 48 screw inferiorly and locked that and the 36 at the base of coracoid, 18 nonlocked posteriorly and then a 24 locked anteriorly.  Good purchase with the screws and nice secure baseplate, we used a  #42 eccentric glenosphere for best bony coverage, dialed the eccentricity down inferiorly.  We had nice coverage and good scapular neck shape, so we should not have any impingement.  We then reduced the shoulder with a 42, +3 trial poly, which gave [place] a good tight conjoined tendon.  The patient's head had been chronically subluxed superiorly, so we were already pretty tight with +3.  The axillary nerve was under some tension but not excessive.  At this point, we removed the trial components on the humeral side.  We went ahead and used impaction bone grafting technique and impacted the HA press-fit stem so a 14 stem and an epi-2 right set on the 0 setting placed in 10 degrees retroversion, impacted that securely into the humerus.  I was able to test the stability of that.  We used available bone graft for the head for the impaction grafting technique, but we had a very, very secure implant.  So, we went ahead at this point and selected our 42, +3 poly, impacted that in place, reduced the shoulder with a nice little pop, good tension.  No gapping with external rotation or inferior pole and felt like we had no bony impingement or implant impingement.  We irrigated thoroughly.  I then closed the deltopectoral interval with 0 Vicryl suture followed by 2-0 Vicryl for subcutaneous closure and 4-0 Monocryl for skin.  Steri-Strips applied followed by sterile dressing. The patient tolerated the surgery well.     Doran Heater. Veverly Fells, M.D.     SRN/MEDQ  D:  06/04/2016  T:  06/05/2016  Job:  [id number]

## 2016-06-09 DIAGNOSIS — D229 Melanocytic nevi, unspecified: Secondary | ICD-10-CM | POA: Diagnosis not present

## 2016-06-09 DIAGNOSIS — D1801 Hemangioma of skin and subcutaneous tissue: Secondary | ICD-10-CM | POA: Diagnosis not present

## 2016-06-09 DIAGNOSIS — Z23 Encounter for immunization: Secondary | ICD-10-CM | POA: Diagnosis not present

## 2016-06-09 DIAGNOSIS — L814 Other melanin hyperpigmentation: Secondary | ICD-10-CM | POA: Diagnosis not present

## 2016-06-09 DIAGNOSIS — L570 Actinic keratosis: Secondary | ICD-10-CM | POA: Diagnosis not present

## 2016-06-09 DIAGNOSIS — Z85828 Personal history of other malignant neoplasm of skin: Secondary | ICD-10-CM | POA: Diagnosis not present

## 2016-06-09 DIAGNOSIS — L821 Other seborrheic keratosis: Secondary | ICD-10-CM | POA: Diagnosis not present

## 2016-06-10 DIAGNOSIS — Z8546 Personal history of malignant neoplasm of prostate: Secondary | ICD-10-CM | POA: Diagnosis not present

## 2016-06-10 DIAGNOSIS — Z471 Aftercare following joint replacement surgery: Secondary | ICD-10-CM | POA: Diagnosis not present

## 2016-06-10 DIAGNOSIS — G250 Essential tremor: Secondary | ICD-10-CM | POA: Diagnosis not present

## 2016-06-10 DIAGNOSIS — Z96652 Presence of left artificial knee joint: Secondary | ICD-10-CM | POA: Diagnosis not present

## 2016-06-10 DIAGNOSIS — I10 Essential (primary) hypertension: Secondary | ICD-10-CM | POA: Diagnosis not present

## 2016-06-10 DIAGNOSIS — N312 Flaccid neuropathic bladder, not elsewhere classified: Secondary | ICD-10-CM | POA: Diagnosis not present

## 2016-06-10 DIAGNOSIS — G629 Polyneuropathy, unspecified: Secondary | ICD-10-CM | POA: Diagnosis not present

## 2016-06-10 DIAGNOSIS — N139 Obstructive and reflux uropathy, unspecified: Secondary | ICD-10-CM | POA: Diagnosis not present

## 2016-06-10 DIAGNOSIS — Z7982 Long term (current) use of aspirin: Secondary | ICD-10-CM | POA: Diagnosis not present

## 2016-06-10 DIAGNOSIS — Z96611 Presence of right artificial shoulder joint: Secondary | ICD-10-CM | POA: Diagnosis not present

## 2016-06-16 DIAGNOSIS — E782 Mixed hyperlipidemia: Secondary | ICD-10-CM | POA: Diagnosis not present

## 2016-06-16 DIAGNOSIS — Z1389 Encounter for screening for other disorder: Secondary | ICD-10-CM | POA: Diagnosis not present

## 2016-06-16 DIAGNOSIS — N319 Neuromuscular dysfunction of bladder, unspecified: Secondary | ICD-10-CM | POA: Diagnosis not present

## 2016-06-16 DIAGNOSIS — D696 Thrombocytopenia, unspecified: Secondary | ICD-10-CM | POA: Diagnosis not present

## 2016-06-16 DIAGNOSIS — Z0000 Encounter for general adult medical examination without abnormal findings: Secondary | ICD-10-CM | POA: Diagnosis not present

## 2016-06-16 DIAGNOSIS — N139 Obstructive and reflux uropathy, unspecified: Secondary | ICD-10-CM | POA: Diagnosis not present

## 2016-06-16 DIAGNOSIS — Z8546 Personal history of malignant neoplasm of prostate: Secondary | ICD-10-CM | POA: Diagnosis not present

## 2016-06-16 DIAGNOSIS — I10 Essential (primary) hypertension: Secondary | ICD-10-CM | POA: Diagnosis not present

## 2016-06-17 DIAGNOSIS — Z471 Aftercare following joint replacement surgery: Secondary | ICD-10-CM | POA: Diagnosis not present

## 2016-06-17 DIAGNOSIS — Z96611 Presence of right artificial shoulder joint: Secondary | ICD-10-CM | POA: Diagnosis not present

## 2016-06-26 NOTE — Progress Notes (Signed)
[name] [name] Date of Birth: 1928/01/04 Medical Record #[id number]  History of Present Illness: [name] is seen for followup. He has a history of lower extremity edema mostly on the left due to venous insufficiency.  Lower extremity venous Dopplers  showed no DVT. His echocardiogram showed normal LV function. He had aortic valve sclerosis. There was mild to moderate mitral insufficiency. He was seeing a vein specialist. Continues to use support hose. States his swelling is down in the am. He denies any chest pain or SOB. He does complain that he is unable to sleep as long as he had in the past. He did undergo right shoulder replacement in early December without complications.  Current Outpatient Prescriptions on File Prior to Visit  Medication Sig Dispense Refill  . aspirin 81 MG tablet Take 81 mg by mouth at bedtime.     Marland Kitchen b complex vitamins tablet Take 1 tablet by mouth daily.    Marland Kitchen dutasteride (AVODART) 0.5 MG capsule Take 0.5 mg by mouth daily.    . folic acid (FOLVITE) 250 MCG tablet Take 800 mcg by mouth daily.     Marland Kitchen MELATONIN PO Take 1 tablet by mouth at bedtime.    . Multiple Vitamins-Minerals (CENTRUM SILVER PO) Take 1 tablet by mouth daily.     . multivitamin-lutein (OCUVITE-LUTEIN) CAPS capsule Take 1 capsule by mouth daily.    . NON FORMULARY     . Omega-3 Fatty Acids (FISH OIL PO) Take 1 capsule by mouth daily.    . saw palmetto 160 MG capsule Take 160 mg by mouth 2 (two) times daily.    Marland Kitchen sulfaSALAzine (AZULFIDINE) 500 MG tablet Take 2,000 mg by mouth daily.     . tamsulosin (FLOMAX) 0.4 MG CAPS Take 0.4 mg by mouth 2 (two) times daily.      No current facility-administered medications on file prior to visit.     Allergies  Allergen Reactions  . Simvastatin Other (See Comments)    "caused neuropathy pain"    Past Medical History:  Diagnosis Date  . Arthritis   . Colitis   . Edema   . Essential hypertension    no med now  . Essential tremor   . Hyperlipidemia    . Hypotonic bladder    L4-L5  . Obstructive uropathy   . Peripheral neuropathy (HCC)    Elevated by Dr. love 2010  . Prostate cancer ([place]) 2005    Past Surgical History:  Procedure Laterality Date  . ACHILLES TENDON SURGERY Bilateral 1995  . COLONOSCOPY     With polyp resection  . REVERSE SHOULDER ARTHROPLASTY Right 06/04/2016  . REVERSE SHOULDER ARTHROPLASTY Right 06/04/2016   Procedure: REVERSE SHOULDER ARTHROPLASTY;  Surgeon: Netta Cedars, MD;  Location: [place];  Service: Orthopedics;  Laterality: Right;  . SKIN CANCER EXCISION  1974   On chest wall  . TOTAL KNEE ARTHROPLASTY Right 2011  . TOTAL KNEE ARTHROPLASTY Left 04/09/2013   Procedure: LEFT TOTAL KNEE ARTHROPLASTY;  Surgeon: Gearlean Alf, MD;  Location: [facility];  Service: Orthopedics;  Laterality: Left;    History  Smoking Status  . Never Smoker  Smokeless Tobacco  . Never Used    History  Alcohol Use  . 0.6 oz/week  . 1 Glasses of wine per week    Comment: occ    Family History  Problem Relation Age of Onset  . Hypertension Mother   . Heart failure Mother   . Diabetes Mother  Review of Systems: As noted in history of present illness.  All other systems were reviewed and are negative.  Physical Exam: BP 120/86 (BP Location: Left Arm, Patient Position: Sitting, Cuff Size: Normal)   Pulse 86   Ht 5' 9.5" (1.765 m)   Wt 185 lb 6 oz (84.1 kg)   BMI 26.98 kg/m  He is a pleasant white male who appears younger than his stated age. HEENT: Normal. He has no JVD, adenopathy, thyromegaly, or bruits. Lungs: Clear Cardiovascular: Regular rate and rhythm, normal S1 and S2, grade 1/6 systolic murmur at the left upper sternal border. No diastolic murmur or S3. Abdomen: Overweight, soft, nontender. No masses or hepatosplenomegaly. Extremities: Femoral and pedal pulses are 2+. He 1-2+ edema on the left. He has prominent venous varicosities Skin: Warm and dry Neuro: Alert and oriented x3. Cranial nerves II  through XII are intact.  LABORATORY DATA: Lab Results  Component Value Date   WBC 7.6 05/26/2016   HGB 11.8 (L) 06/05/2016   HCT 35.0 (L) 06/05/2016   PLT 167 05/26/2016   GLUCOSE 171 (H) 06/05/2016   ALT 20 08/14/2014   AST 39 (H) 08/14/2014   NA 134 (L) 06/05/2016   K 4.2 06/05/2016   CL 101 06/05/2016   CREATININE 1.15 06/05/2016   BUN 12 06/05/2016   CO2 23 06/05/2016   TSH 0.94 10/02/2012   INR 1.02 04/04/2013   Labs dated 06/16/16: cholesterol 211, triglycerides 86, LDL 135, HDL 59. CMET, CBC, TSH normal.  Ecg:04/07/16 shows NSR. Normal. I have personally reviewed and interpreted this study.   Assessment / Plan: 1. Edema. Secondary to venous insufficiency. L>R. His edema resolves at night with elevation.   I recommended conservative measures with elevation of his feet when possible, sodium restriction, and use of compression stockings.  2. Hypercholesterolemia. Patient is intolerant to statins. He quit taking Zetia  Follow up in one year.

## 2016-06-30 ENCOUNTER — Encounter: Payer: Self-pay | Admitting: Cardiology

## 2016-06-30 ENCOUNTER — Ambulatory Visit (INDEPENDENT_AMBULATORY_CARE_PROVIDER_SITE_OTHER): Payer: Medicare HMO | Admitting: Cardiology

## 2016-06-30 VITALS — BP 120/86 | HR 86 | Ht 69.5 in | Wt 185.4 lb

## 2016-06-30 DIAGNOSIS — E7800 Pure hypercholesterolemia, unspecified: Secondary | ICD-10-CM | POA: Diagnosis not present

## 2016-06-30 DIAGNOSIS — R600 Localized edema: Secondary | ICD-10-CM | POA: Diagnosis not present

## 2016-06-30 NOTE — Patient Instructions (Addendum)
Dr. Martinique completed his routine annual cardiac exam today and blood pressure check.  Continue your current therapy  I will see you in one year.

## 2016-07-15 DIAGNOSIS — Z96611 Presence of right artificial shoulder joint: Secondary | ICD-10-CM | POA: Diagnosis not present

## 2016-08-02 DIAGNOSIS — C61 Malignant neoplasm of prostate: Secondary | ICD-10-CM | POA: Diagnosis not present

## 2016-08-09 ENCOUNTER — Other Ambulatory Visit (HOSPITAL_COMMUNITY): Payer: Self-pay | Admitting: Urology

## 2016-08-09 DIAGNOSIS — C61 Malignant neoplasm of prostate: Secondary | ICD-10-CM | POA: Diagnosis not present

## 2016-08-09 DIAGNOSIS — N312 Flaccid neuropathic bladder, not elsewhere classified: Secondary | ICD-10-CM | POA: Diagnosis not present

## 2016-08-23 ENCOUNTER — Encounter (HOSPITAL_COMMUNITY)
Admission: RE | Admit: 2016-08-23 | Discharge: 2016-08-23 | Disposition: A | Discharge status: Home / Self Care | Payer: Medicare HMO | Source: Ambulatory Visit | Attending: Urology | Admitting: Urology

## 2016-08-23 DIAGNOSIS — R937 Abnormal findings on diagnostic imaging of other parts of musculoskeletal system: Secondary | ICD-10-CM | POA: Insufficient documentation

## 2016-08-23 DIAGNOSIS — C61 Malignant neoplasm of prostate: Secondary | ICD-10-CM | POA: Diagnosis present

## 2016-08-23 MED ORDER — TECHNETIUM TC 99M MEDRONATE IV KIT
20.9000 | PACK | Freq: Once | INTRAVENOUS | Status: AC | PRN
  Administered 2016-08-23: 20.9 via INTRAVENOUS

## 2016-09-13 ENCOUNTER — Ambulatory Visit (HOSPITAL_COMMUNITY)
Admission: RE | Admit: 2016-09-13 | Discharge: 2016-09-13 | Disposition: A | Discharge status: Home / Self Care | Payer: Medicare HMO | Source: Ambulatory Visit | Attending: Urology | Admitting: Urology

## 2016-09-13 ENCOUNTER — Other Ambulatory Visit (HOSPITAL_COMMUNITY): Payer: Self-pay | Admitting: Urology

## 2016-09-13 DIAGNOSIS — Z9889 Other specified postprocedural states: Secondary | ICD-10-CM | POA: Diagnosis not present

## 2016-09-13 DIAGNOSIS — Z471 Aftercare following joint replacement surgery: Secondary | ICD-10-CM | POA: Diagnosis not present

## 2016-09-13 DIAGNOSIS — C61 Malignant neoplasm of prostate: Secondary | ICD-10-CM

## 2016-09-13 DIAGNOSIS — M19011 Primary osteoarthritis, right shoulder: Secondary | ICD-10-CM | POA: Diagnosis not present

## 2016-09-13 DIAGNOSIS — Z96611 Presence of right artificial shoulder joint: Secondary | ICD-10-CM | POA: Diagnosis not present

## 2016-09-13 DIAGNOSIS — R937 Abnormal findings on diagnostic imaging of other parts of musculoskeletal system: Secondary | ICD-10-CM | POA: Diagnosis not present

## 2016-11-15 DIAGNOSIS — H353131 Nonexudative age-related macular degeneration, bilateral, early dry stage: Secondary | ICD-10-CM | POA: Diagnosis not present

## 2016-11-23 DIAGNOSIS — J069 Acute upper respiratory infection, unspecified: Secondary | ICD-10-CM | POA: Diagnosis not present

## 2016-12-06 DIAGNOSIS — R69 Illness, unspecified: Secondary | ICD-10-CM | POA: Diagnosis not present

## 2016-12-13 DIAGNOSIS — L570 Actinic keratosis: Secondary | ICD-10-CM | POA: Diagnosis not present

## 2016-12-13 DIAGNOSIS — D1801 Hemangioma of skin and subcutaneous tissue: Secondary | ICD-10-CM | POA: Diagnosis not present

## 2016-12-13 DIAGNOSIS — C44612 Basal cell carcinoma of skin of right upper limb, including shoulder: Secondary | ICD-10-CM | POA: Diagnosis not present

## 2016-12-13 DIAGNOSIS — D225 Melanocytic nevi of trunk: Secondary | ICD-10-CM | POA: Diagnosis not present

## 2016-12-13 DIAGNOSIS — L814 Other melanin hyperpigmentation: Secondary | ICD-10-CM | POA: Diagnosis not present

## 2016-12-13 DIAGNOSIS — Z85828 Personal history of other malignant neoplasm of skin: Secondary | ICD-10-CM | POA: Diagnosis not present

## 2016-12-13 DIAGNOSIS — D485 Neoplasm of uncertain behavior of skin: Secondary | ICD-10-CM | POA: Diagnosis not present

## 2016-12-13 DIAGNOSIS — L821 Other seborrheic keratosis: Secondary | ICD-10-CM | POA: Diagnosis not present

## 2016-12-14 DIAGNOSIS — Z96611 Presence of right artificial shoulder joint: Secondary | ICD-10-CM | POA: Diagnosis not present

## 2016-12-14 DIAGNOSIS — Z471 Aftercare following joint replacement surgery: Secondary | ICD-10-CM | POA: Diagnosis not present

## 2017-01-17 DIAGNOSIS — C44612 Basal cell carcinoma of skin of right upper limb, including shoulder: Secondary | ICD-10-CM | POA: Diagnosis not present

## 2017-01-31 DIAGNOSIS — C61 Malignant neoplasm of prostate: Secondary | ICD-10-CM | POA: Diagnosis not present

## 2017-02-14 DIAGNOSIS — N400 Enlarged prostate without lower urinary tract symptoms: Secondary | ICD-10-CM | POA: Diagnosis not present

## 2017-02-14 DIAGNOSIS — C61 Malignant neoplasm of prostate: Secondary | ICD-10-CM | POA: Diagnosis not present

## 2017-04-08 DIAGNOSIS — R69 Illness, unspecified: Secondary | ICD-10-CM | POA: Diagnosis not present

## 2017-04-18 DIAGNOSIS — R69 Illness, unspecified: Secondary | ICD-10-CM | POA: Diagnosis not present

## 2017-06-13 DIAGNOSIS — Z85828 Personal history of other malignant neoplasm of skin: Secondary | ICD-10-CM | POA: Diagnosis not present

## 2017-06-13 DIAGNOSIS — L570 Actinic keratosis: Secondary | ICD-10-CM | POA: Diagnosis not present

## 2017-06-13 DIAGNOSIS — L821 Other seborrheic keratosis: Secondary | ICD-10-CM | POA: Diagnosis not present

## 2017-06-13 DIAGNOSIS — Z23 Encounter for immunization: Secondary | ICD-10-CM | POA: Diagnosis not present

## 2017-06-13 DIAGNOSIS — L219 Seborrheic dermatitis, unspecified: Secondary | ICD-10-CM | POA: Diagnosis not present

## 2017-06-13 DIAGNOSIS — D1800 Hemangioma unspecified site: Secondary | ICD-10-CM | POA: Diagnosis not present

## 2017-06-13 DIAGNOSIS — D225 Melanocytic nevi of trunk: Secondary | ICD-10-CM | POA: Diagnosis not present

## 2017-06-23 DIAGNOSIS — Z0000 Encounter for general adult medical examination without abnormal findings: Secondary | ICD-10-CM | POA: Diagnosis not present

## 2017-06-23 DIAGNOSIS — N319 Neuromuscular dysfunction of bladder, unspecified: Secondary | ICD-10-CM | POA: Diagnosis not present

## 2017-06-23 DIAGNOSIS — D696 Thrombocytopenia, unspecified: Secondary | ICD-10-CM | POA: Diagnosis not present

## 2017-06-23 DIAGNOSIS — K5150 Left sided colitis without complications: Secondary | ICD-10-CM | POA: Diagnosis not present

## 2017-06-23 DIAGNOSIS — I10 Essential (primary) hypertension: Secondary | ICD-10-CM | POA: Diagnosis not present

## 2017-06-23 DIAGNOSIS — Z8546 Personal history of malignant neoplasm of prostate: Secondary | ICD-10-CM | POA: Diagnosis not present

## 2017-06-23 DIAGNOSIS — E782 Mixed hyperlipidemia: Secondary | ICD-10-CM | POA: Diagnosis not present

## 2017-06-23 DIAGNOSIS — M48061 Spinal stenosis, lumbar region without neurogenic claudication: Secondary | ICD-10-CM | POA: Diagnosis not present

## 2017-06-23 DIAGNOSIS — Z23 Encounter for immunization: Secondary | ICD-10-CM | POA: Diagnosis not present

## 2017-06-23 DIAGNOSIS — E663 Overweight: Secondary | ICD-10-CM | POA: Diagnosis not present

## 2017-07-04 DIAGNOSIS — H353131 Nonexudative age-related macular degeneration, bilateral, early dry stage: Secondary | ICD-10-CM | POA: Diagnosis not present

## 2017-07-06 DIAGNOSIS — Z0100 Encounter for examination of eyes and vision without abnormal findings: Secondary | ICD-10-CM | POA: Diagnosis not present

## 2017-07-27 DIAGNOSIS — C61 Malignant neoplasm of prostate: Secondary | ICD-10-CM | POA: Diagnosis not present

## 2017-08-03 DIAGNOSIS — C61 Malignant neoplasm of prostate: Secondary | ICD-10-CM | POA: Diagnosis not present

## 2017-08-03 DIAGNOSIS — R350 Frequency of micturition: Secondary | ICD-10-CM | POA: Diagnosis not present

## 2017-08-03 DIAGNOSIS — N403 Nodular prostate with lower urinary tract symptoms: Secondary | ICD-10-CM | POA: Diagnosis not present

## 2017-08-04 ENCOUNTER — Other Ambulatory Visit: Payer: Self-pay | Admitting: Urology

## 2017-08-04 DIAGNOSIS — C61 Malignant neoplasm of prostate: Secondary | ICD-10-CM

## 2017-08-12 ENCOUNTER — Encounter: Payer: Self-pay | Admitting: *Deleted

## 2017-08-16 ENCOUNTER — Encounter (HOSPITAL_COMMUNITY): Payer: Medicare HMO

## 2017-08-16 ENCOUNTER — Other Ambulatory Visit (HOSPITAL_COMMUNITY): Payer: Medicare HMO

## 2017-08-17 DIAGNOSIS — M545 Low back pain: Secondary | ICD-10-CM | POA: Diagnosis not present

## 2017-08-18 NOTE — Progress Notes (Signed)
[name] [name] Date of Birth: 07-31-1927 Medical Record #[id number]  History of Present Illness: [name] [name] is seen for followup. He has a history of lower extremity edema mostly on the left due to venous insufficiency.  Lower extremity venous Dopplers  showed no DVT. His echocardiogram showed normal LV function. He had aortic valve sclerosis. There was mild to moderate mitral insufficiency.   On follow up today he is doing very well.  He denies any chest pain or SOB. No palpitations. Rarely uses support hose. Stays very active with exercise and works at the [employer] and for [employer].   Current Outpatient Medications on File Prior to Visit  Medication Sig Dispense Refill  . aspirin 81 MG tablet Take 81 mg by mouth at bedtime.     . finasteride (PROSCAR) 5 MG tablet     . folic acid (FOLVITE) 630 MCG tablet Take 800 mcg by mouth daily.     . Multiple Vitamins-Minerals (CENTRUM SILVER PO) Take 1 tablet by mouth daily.     Marland Kitchen sulfaSALAzine (AZULFIDINE) 500 MG tablet Take 2,000 mg by mouth daily.     . tamsulosin (FLOMAX) 0.4 MG CAPS Take 0.4 mg by mouth 2 (two) times daily.      No current facility-administered medications on file prior to visit.     Allergies  Allergen Reactions  . Simvastatin Other (See Comments)    "caused neuropathy pain"    Past Medical History:  Diagnosis Date  . Arthritis   . Colitis   . Edema   . Essential hypertension    no med now  . Essential tremor   . Hyperlipidemia   . Hypotonic bladder    L4-L5  . Obstructive uropathy   . Peripheral neuropathy    Elevated by Dr. love 2010  . Prostate cancer ([place]) 2005    Past Surgical History:  Procedure Laterality Date  . ACHILLES TENDON SURGERY Bilateral 1995  . COLONOSCOPY     With polyp resection  . REVERSE SHOULDER ARTHROPLASTY Right 06/04/2016  . REVERSE SHOULDER ARTHROPLASTY Right 06/04/2016   Procedure: REVERSE SHOULDER ARTHROPLASTY;  Surgeon: Netta Cedars, MD;  Location: [place];  Service:  Orthopedics;  Laterality: Right;  . SKIN CANCER EXCISION  1974   On chest wall  . TOTAL KNEE ARTHROPLASTY Right 2011  . TOTAL KNEE ARTHROPLASTY Left 04/09/2013   Procedure: LEFT TOTAL KNEE ARTHROPLASTY;  Surgeon: Gearlean Alf, MD;  Location: [facility];  Service: Orthopedics;  Laterality: Left;    Social History   Tobacco Use  Smoking Status Never Smoker  Smokeless Tobacco Never Used    Social History   Substance and Sexual Activity  Alcohol Use Yes  . Alcohol/week: 0.6 oz  . Types: 1 Glasses of wine per week   Comment: occ    Family History  Problem Relation Age of Onset  . Hypertension Mother   . Heart failure Mother   . Diabetes Mother   . Heart attack Brother   . Heart attack Maternal Aunt     Review of Systems: As noted in history of present illness.  All other systems were reviewed and are negative.  Physical Exam: BP 127/79   Pulse 98   Ht 5' 9.5" (1.765 m)   Wt 181 lb 9.6 oz (82.4 kg)   BMI 26.43 kg/m  He is a pleasant white male who appears younger than his stated age. HEENT: Normal. He has no JVD, adenopathy, thyromegaly, or bruits. Lungs: Clear Cardiovascular: Regular rate and  rhythm, normal S1 and S2, grade 1/6 systolic murmur at the left upper sternal border. No diastolic murmur or S3. Abdomen: Overweight, soft, nontender. No masses or hepatosplenomegaly. Extremities: Femoral and pedal pulses are 2+. He 1-2+ edema on the left. He has prominent venous varicosities Skin: Warm and dry Neuro: Alert and oriented x3. Cranial nerves II through XII are intact.  LABORATORY DATA: Lab Results  Component Value Date   WBC 7.6 05/26/2016   HGB 11.8 (L) 06/05/2016   HCT 35.0 (L) 06/05/2016   PLT 167 05/26/2016   GLUCOSE 171 (H) 06/05/2016   ALT 20 08/14/2014   AST 39 (H) 08/14/2014   NA 134 (L) 06/05/2016   K 4.2 06/05/2016   CL 101 06/05/2016   CREATININE 1.15 06/05/2016   BUN 12 06/05/2016   CO2 23 06/05/2016   TSH 0.94 10/02/2012   INR 1.02  04/04/2013   Labs dated 06/16/16: cholesterol 211, triglycerides 86, LDL 135, HDL 59. CMET, CBC, TSH normal. Dated 06/23/17: cholesterol 216, triglycerides 99, HDL 65, LDL 131. CBC, Chemistries, TSH normal.   UJW:[id number] shows NSR with first degree AV block. Occ. PVCs. Nonspecific ST-T wave abnormality. I  have personally reviewed and interpreted this study.   Assessment / Plan: 1. Edema. Secondary to venous insufficiency. L>R. His edema resolves at night with elevation.   I recommended conservative measures with elevation of his feet when possible, sodium restriction, and use of compression stockings.   I will follow up PRN

## 2017-08-22 ENCOUNTER — Encounter: Payer: Self-pay | Admitting: Cardiology

## 2017-08-22 ENCOUNTER — Ambulatory Visit: Payer: Medicare HMO | Admitting: Cardiology

## 2017-08-22 VITALS — BP 127/79 | HR 98 | Ht 69.5 in | Wt 181.6 lb

## 2017-08-22 DIAGNOSIS — R600 Localized edema: Secondary | ICD-10-CM

## 2017-08-22 NOTE — Patient Instructions (Addendum)
Continue your current therapy  I will see you PRN

## 2017-08-23 ENCOUNTER — Encounter (HOSPITAL_COMMUNITY)
Admission: RE | Admit: 2017-08-23 | Discharge: 2017-08-23 | Disposition: A | Discharge status: Home / Self Care | Payer: Medicare HMO | Source: Ambulatory Visit | Attending: Urology | Admitting: Urology

## 2017-08-23 DIAGNOSIS — N402 Nodular prostate without lower urinary tract symptoms: Secondary | ICD-10-CM | POA: Diagnosis not present

## 2017-08-23 DIAGNOSIS — C61 Malignant neoplasm of prostate: Secondary | ICD-10-CM | POA: Insufficient documentation

## 2017-08-23 MED ORDER — TECHNETIUM TC 99M MEDRONATE IV KIT
21.3000 | PACK | Freq: Once | INTRAVENOUS | Status: AC | PRN
  Administered 2017-08-23: 21.3 via INTRAVENOUS

## 2017-09-05 DIAGNOSIS — M545 Low back pain: Secondary | ICD-10-CM | POA: Diagnosis not present

## 2017-09-14 DIAGNOSIS — M545 Low back pain: Secondary | ICD-10-CM | POA: Diagnosis not present

## 2017-09-19 DIAGNOSIS — M545 Low back pain: Secondary | ICD-10-CM | POA: Diagnosis not present

## 2017-09-26 DIAGNOSIS — M545 Low back pain: Secondary | ICD-10-CM | POA: Diagnosis not present

## 2017-12-19 DIAGNOSIS — C44612 Basal cell carcinoma of skin of right upper limb, including shoulder: Secondary | ICD-10-CM | POA: Diagnosis not present

## 2017-12-19 DIAGNOSIS — L814 Other melanin hyperpigmentation: Secondary | ICD-10-CM | POA: Diagnosis not present

## 2017-12-19 DIAGNOSIS — D485 Neoplasm of uncertain behavior of skin: Secondary | ICD-10-CM | POA: Diagnosis not present

## 2017-12-19 DIAGNOSIS — L821 Other seborrheic keratosis: Secondary | ICD-10-CM | POA: Diagnosis not present

## 2017-12-19 DIAGNOSIS — L987 Excessive and redundant skin and subcutaneous tissue: Secondary | ICD-10-CM | POA: Diagnosis not present

## 2017-12-19 DIAGNOSIS — L219 Seborrheic dermatitis, unspecified: Secondary | ICD-10-CM | POA: Diagnosis not present

## 2017-12-19 DIAGNOSIS — L578 Other skin changes due to chronic exposure to nonionizing radiation: Secondary | ICD-10-CM | POA: Diagnosis not present

## 2017-12-19 DIAGNOSIS — D225 Melanocytic nevi of trunk: Secondary | ICD-10-CM | POA: Diagnosis not present

## 2017-12-19 DIAGNOSIS — D1801 Hemangioma of skin and subcutaneous tissue: Secondary | ICD-10-CM | POA: Diagnosis not present

## 2017-12-19 DIAGNOSIS — Z85828 Personal history of other malignant neoplasm of skin: Secondary | ICD-10-CM | POA: Diagnosis not present

## 2017-12-19 DIAGNOSIS — L570 Actinic keratosis: Secondary | ICD-10-CM | POA: Diagnosis not present

## 2017-12-21 DIAGNOSIS — R69 Illness, unspecified: Secondary | ICD-10-CM | POA: Diagnosis not present

## 2018-01-23 DIAGNOSIS — C61 Malignant neoplasm of prostate: Secondary | ICD-10-CM | POA: Diagnosis not present

## 2018-03-02 DIAGNOSIS — R509 Fever, unspecified: Secondary | ICD-10-CM | POA: Diagnosis not present

## 2018-03-06 DIAGNOSIS — B349 Viral infection, unspecified: Secondary | ICD-10-CM | POA: Diagnosis not present

## 2018-03-06 DIAGNOSIS — J189 Pneumonia, unspecified organism: Secondary | ICD-10-CM | POA: Diagnosis not present

## 2018-03-07 DIAGNOSIS — C61 Malignant neoplasm of prostate: Secondary | ICD-10-CM | POA: Diagnosis not present

## 2018-03-13 DIAGNOSIS — C44612 Basal cell carcinoma of skin of right upper limb, including shoulder: Secondary | ICD-10-CM | POA: Diagnosis not present

## 2018-03-14 DIAGNOSIS — C44612 Basal cell carcinoma of skin of right upper limb, including shoulder: Secondary | ICD-10-CM | POA: Diagnosis not present

## 2018-03-15 DIAGNOSIS — J181 Lobar pneumonia, unspecified organism: Secondary | ICD-10-CM | POA: Diagnosis not present

## 2018-04-12 DIAGNOSIS — R69 Illness, unspecified: Secondary | ICD-10-CM | POA: Diagnosis not present

## 2018-05-29 DIAGNOSIS — L570 Actinic keratosis: Secondary | ICD-10-CM | POA: Diagnosis not present

## 2018-05-29 DIAGNOSIS — Z23 Encounter for immunization: Secondary | ICD-10-CM | POA: Diagnosis not present

## 2018-05-29 DIAGNOSIS — C44622 Squamous cell carcinoma of skin of right upper limb, including shoulder: Secondary | ICD-10-CM | POA: Diagnosis not present

## 2018-05-29 DIAGNOSIS — D485 Neoplasm of uncertain behavior of skin: Secondary | ICD-10-CM | POA: Diagnosis not present

## 2018-05-29 DIAGNOSIS — L814 Other melanin hyperpigmentation: Secondary | ICD-10-CM | POA: Diagnosis not present

## 2018-05-29 DIAGNOSIS — L821 Other seborrheic keratosis: Secondary | ICD-10-CM | POA: Diagnosis not present

## 2018-05-29 DIAGNOSIS — Z85828 Personal history of other malignant neoplasm of skin: Secondary | ICD-10-CM | POA: Diagnosis not present

## 2018-05-29 DIAGNOSIS — D225 Melanocytic nevi of trunk: Secondary | ICD-10-CM | POA: Diagnosis not present

## 2018-07-11 DIAGNOSIS — C44622 Squamous cell carcinoma of skin of right upper limb, including shoulder: Secondary | ICD-10-CM | POA: Diagnosis not present

## 2018-07-11 DIAGNOSIS — L905 Scar conditions and fibrosis of skin: Secondary | ICD-10-CM | POA: Diagnosis not present

## 2018-07-26 DIAGNOSIS — I10 Essential (primary) hypertension: Secondary | ICD-10-CM | POA: Diagnosis not present

## 2018-07-26 DIAGNOSIS — G250 Essential tremor: Secondary | ICD-10-CM | POA: Diagnosis not present

## 2018-07-26 DIAGNOSIS — N319 Neuromuscular dysfunction of bladder, unspecified: Secondary | ICD-10-CM | POA: Diagnosis not present

## 2018-07-26 DIAGNOSIS — E782 Mixed hyperlipidemia: Secondary | ICD-10-CM | POA: Diagnosis not present

## 2018-07-26 DIAGNOSIS — Z0000 Encounter for general adult medical examination without abnormal findings: Secondary | ICD-10-CM | POA: Diagnosis not present

## 2018-07-26 DIAGNOSIS — G609 Hereditary and idiopathic neuropathy, unspecified: Secondary | ICD-10-CM | POA: Diagnosis not present

## 2018-07-26 DIAGNOSIS — M48061 Spinal stenosis, lumbar region without neurogenic claudication: Secondary | ICD-10-CM | POA: Diagnosis not present

## 2018-07-26 DIAGNOSIS — Z8546 Personal history of malignant neoplasm of prostate: Secondary | ICD-10-CM | POA: Diagnosis not present

## 2018-07-26 DIAGNOSIS — D696 Thrombocytopenia, unspecified: Secondary | ICD-10-CM | POA: Diagnosis not present

## 2018-07-26 DIAGNOSIS — Z1389 Encounter for screening for other disorder: Secondary | ICD-10-CM | POA: Diagnosis not present

## 2018-07-26 DIAGNOSIS — N183 Chronic kidney disease, stage 3 (moderate): Secondary | ICD-10-CM | POA: Diagnosis not present

## 2018-09-06 DIAGNOSIS — C61 Malignant neoplasm of prostate: Secondary | ICD-10-CM | POA: Diagnosis not present

## 2018-09-12 DIAGNOSIS — C61 Malignant neoplasm of prostate: Secondary | ICD-10-CM | POA: Diagnosis not present

## 2018-09-12 DIAGNOSIS — R9721 Rising PSA following treatment for malignant neoplasm of prostate: Secondary | ICD-10-CM | POA: Diagnosis not present

## 2019-01-01 DIAGNOSIS — C44311 Basal cell carcinoma of skin of nose: Secondary | ICD-10-CM | POA: Diagnosis not present

## 2019-01-01 DIAGNOSIS — L814 Other melanin hyperpigmentation: Secondary | ICD-10-CM | POA: Diagnosis not present

## 2019-01-01 DIAGNOSIS — L821 Other seborrheic keratosis: Secondary | ICD-10-CM | POA: Diagnosis not present

## 2019-01-01 DIAGNOSIS — L570 Actinic keratosis: Secondary | ICD-10-CM | POA: Diagnosis not present

## 2019-01-01 DIAGNOSIS — D225 Melanocytic nevi of trunk: Secondary | ICD-10-CM | POA: Diagnosis not present

## 2019-01-01 DIAGNOSIS — Z85828 Personal history of other malignant neoplasm of skin: Secondary | ICD-10-CM | POA: Diagnosis not present

## 2019-01-01 DIAGNOSIS — D485 Neoplasm of uncertain behavior of skin: Secondary | ICD-10-CM | POA: Diagnosis not present

## 2019-01-24 DIAGNOSIS — L719 Rosacea, unspecified: Secondary | ICD-10-CM | POA: Diagnosis not present

## 2019-01-24 DIAGNOSIS — S8011XA Contusion of right lower leg, initial encounter: Secondary | ICD-10-CM | POA: Diagnosis not present

## 2019-01-29 DIAGNOSIS — R69 Illness, unspecified: Secondary | ICD-10-CM | POA: Diagnosis not present

## 2019-03-06 DIAGNOSIS — C61 Malignant neoplasm of prostate: Secondary | ICD-10-CM | POA: Diagnosis not present

## 2019-03-06 DIAGNOSIS — C44311 Basal cell carcinoma of skin of nose: Secondary | ICD-10-CM | POA: Diagnosis not present

## 2019-03-13 DIAGNOSIS — N400 Enlarged prostate without lower urinary tract symptoms: Secondary | ICD-10-CM | POA: Diagnosis not present

## 2019-03-13 DIAGNOSIS — C61 Malignant neoplasm of prostate: Secondary | ICD-10-CM | POA: Diagnosis not present

## 2019-04-27 DIAGNOSIS — R69 Illness, unspecified: Secondary | ICD-10-CM | POA: Diagnosis not present

## 2019-05-28 DIAGNOSIS — I10 Essential (primary) hypertension: Secondary | ICD-10-CM | POA: Diagnosis not present

## 2019-05-28 DIAGNOSIS — E782 Mixed hyperlipidemia: Secondary | ICD-10-CM | POA: Diagnosis not present

## 2019-05-28 DIAGNOSIS — C61 Malignant neoplasm of prostate: Secondary | ICD-10-CM | POA: Diagnosis not present

## 2019-05-28 DIAGNOSIS — Z8546 Personal history of malignant neoplasm of prostate: Secondary | ICD-10-CM | POA: Diagnosis not present

## 2019-05-28 DIAGNOSIS — N183 Chronic kidney disease, stage 3 unspecified: Secondary | ICD-10-CM | POA: Diagnosis not present

## 2019-05-28 DIAGNOSIS — M19011 Primary osteoarthritis, right shoulder: Secondary | ICD-10-CM | POA: Diagnosis not present

## 2019-06-06 DIAGNOSIS — Z8546 Personal history of malignant neoplasm of prostate: Secondary | ICD-10-CM | POA: Diagnosis not present

## 2019-06-06 DIAGNOSIS — N183 Chronic kidney disease, stage 3 unspecified: Secondary | ICD-10-CM | POA: Diagnosis not present

## 2019-06-06 DIAGNOSIS — I10 Essential (primary) hypertension: Secondary | ICD-10-CM | POA: Diagnosis not present

## 2019-06-06 DIAGNOSIS — C61 Malignant neoplasm of prostate: Secondary | ICD-10-CM | POA: Diagnosis not present

## 2019-06-06 DIAGNOSIS — E782 Mixed hyperlipidemia: Secondary | ICD-10-CM | POA: Diagnosis not present

## 2019-06-06 DIAGNOSIS — M19011 Primary osteoarthritis, right shoulder: Secondary | ICD-10-CM | POA: Diagnosis not present

## 2019-07-09 DIAGNOSIS — D485 Neoplasm of uncertain behavior of skin: Secondary | ICD-10-CM | POA: Diagnosis not present

## 2019-07-09 DIAGNOSIS — L814 Other melanin hyperpigmentation: Secondary | ICD-10-CM | POA: Diagnosis not present

## 2019-07-09 DIAGNOSIS — D225 Melanocytic nevi of trunk: Secondary | ICD-10-CM | POA: Diagnosis not present

## 2019-07-09 DIAGNOSIS — C44622 Squamous cell carcinoma of skin of right upper limb, including shoulder: Secondary | ICD-10-CM | POA: Diagnosis not present

## 2019-07-09 DIAGNOSIS — Z23 Encounter for immunization: Secondary | ICD-10-CM | POA: Diagnosis not present

## 2019-07-09 DIAGNOSIS — L578 Other skin changes due to chronic exposure to nonionizing radiation: Secondary | ICD-10-CM | POA: Diagnosis not present

## 2019-07-09 DIAGNOSIS — L821 Other seborrheic keratosis: Secondary | ICD-10-CM | POA: Diagnosis not present

## 2019-07-09 DIAGNOSIS — Z85828 Personal history of other malignant neoplasm of skin: Secondary | ICD-10-CM | POA: Diagnosis not present

## 2019-07-18 DIAGNOSIS — H353131 Nonexudative age-related macular degeneration, bilateral, early dry stage: Secondary | ICD-10-CM | POA: Diagnosis not present

## 2019-07-26 DIAGNOSIS — C44622 Squamous cell carcinoma of skin of right upper limb, including shoulder: Secondary | ICD-10-CM | POA: Diagnosis not present

## 2019-07-26 DIAGNOSIS — L905 Scar conditions and fibrosis of skin: Secondary | ICD-10-CM | POA: Diagnosis not present

## 2019-08-01 DIAGNOSIS — I10 Essential (primary) hypertension: Secondary | ICD-10-CM | POA: Diagnosis not present

## 2019-08-01 DIAGNOSIS — C61 Malignant neoplasm of prostate: Secondary | ICD-10-CM | POA: Diagnosis not present

## 2019-08-01 DIAGNOSIS — N183 Chronic kidney disease, stage 3 unspecified: Secondary | ICD-10-CM | POA: Diagnosis not present

## 2019-08-01 DIAGNOSIS — E782 Mixed hyperlipidemia: Secondary | ICD-10-CM | POA: Diagnosis not present

## 2019-08-01 DIAGNOSIS — M19011 Primary osteoarthritis, right shoulder: Secondary | ICD-10-CM | POA: Diagnosis not present

## 2019-08-01 DIAGNOSIS — Z8546 Personal history of malignant neoplasm of prostate: Secondary | ICD-10-CM | POA: Diagnosis not present

## 2019-08-08 DIAGNOSIS — Z8546 Personal history of malignant neoplasm of prostate: Secondary | ICD-10-CM | POA: Diagnosis not present

## 2019-08-08 DIAGNOSIS — R7301 Impaired fasting glucose: Secondary | ICD-10-CM | POA: Diagnosis not present

## 2019-08-08 DIAGNOSIS — N319 Neuromuscular dysfunction of bladder, unspecified: Secondary | ICD-10-CM | POA: Diagnosis not present

## 2019-08-08 DIAGNOSIS — N183 Chronic kidney disease, stage 3 unspecified: Secondary | ICD-10-CM | POA: Diagnosis not present

## 2019-08-08 DIAGNOSIS — D696 Thrombocytopenia, unspecified: Secondary | ICD-10-CM | POA: Diagnosis not present

## 2019-08-08 DIAGNOSIS — J309 Allergic rhinitis, unspecified: Secondary | ICD-10-CM | POA: Diagnosis not present

## 2019-08-08 DIAGNOSIS — Z0000 Encounter for general adult medical examination without abnormal findings: Secondary | ICD-10-CM | POA: Diagnosis not present

## 2019-08-08 DIAGNOSIS — I10 Essential (primary) hypertension: Secondary | ICD-10-CM | POA: Diagnosis not present

## 2019-08-20 ENCOUNTER — Ambulatory Visit (INDEPENDENT_AMBULATORY_CARE_PROVIDER_SITE_OTHER): Payer: Medicare HMO | Admitting: Otolaryngology

## 2019-08-20 ENCOUNTER — Other Ambulatory Visit: Payer: Self-pay

## 2019-08-20 VITALS — Temp 97.9°F

## 2019-08-20 DIAGNOSIS — H903 Sensorineural hearing loss, bilateral: Secondary | ICD-10-CM | POA: Diagnosis not present

## 2019-08-20 DIAGNOSIS — H9311 Tinnitus, right ear: Secondary | ICD-10-CM | POA: Diagnosis not present

## 2019-08-20 DIAGNOSIS — H9313 Tinnitus, bilateral: Secondary | ICD-10-CM

## 2019-08-20 NOTE — Progress Notes (Signed)
HPI: [name] [name] is a 86 y.o. male who presents for evaluation of bilateral tinnitus that he has had for years but has gradually gotten worse over the past 6 months.  He also complains of intermittent runny nose when he eats breakfast.  Does not have that much trouble breathing through the nose. He got hearing aids about 5 or 6 years ago from the [place] but lost these after several months and does not use hearing aids presently although he does not hear well.  Past Medical History:  Diagnosis Date  . Arthritis   . Colitis   . Edema   . Essential hypertension    no med now  . Essential tremor   . Hyperlipidemia   . Hypotonic bladder    L4-L5  . Obstructive uropathy   . Peripheral neuropathy    Elevated by Dr. love 2010  . Prostate cancer ([place]) 2005   Past Surgical History:  Procedure Laterality Date  . ACHILLES TENDON SURGERY Bilateral 1995  . COLONOSCOPY     With polyp resection  . REVERSE SHOULDER ARTHROPLASTY Right 06/04/2016  . REVERSE SHOULDER ARTHROPLASTY Right 06/04/2016   Procedure: REVERSE SHOULDER ARTHROPLASTY;  Surgeon: [name] Cedars, MD;  Location: [place];  Service: Orthopedics;  Laterality: Right;  . SKIN CANCER EXCISION  1974   On chest wall  . TOTAL KNEE ARTHROPLASTY Right 2011  . TOTAL KNEE ARTHROPLASTY Left 04/09/2013   Procedure: LEFT TOTAL KNEE ARTHROPLASTY;  Surgeon: [name] Alf, MD;  Location: [facility];  Service: Orthopedics;  Laterality: Left;   Social History   Socioeconomic History  . Marital status: Married    Spouse name: Not on file  . Number of children: Not on file  . Years of education: Not on file  . Highest education level: Not on file  Occupational History  . Not on file  Tobacco Use  . Smoking status: Never Smoker  . Smokeless tobacco: Never Used  Substance and Sexual Activity  . Alcohol use: Yes    Alcohol/week: 1.0 standard drinks    Types: 1 Glasses of wine per week    Comment: occ  . Drug use: No  . Sexual activity: Yes  Other  Topics Concern  . Not on file  Social History Narrative  . Not on file   Social Determinants of Health   Financial Resource Strain:   . Difficulty of Paying Living Expenses: Not on file  Food Insecurity:   . Worried About Charity fundraiser in the Last Year: Not on file  . Ran Out of Food in the Last Year: Not on file  Transportation Needs:   . Lack of Transportation (Medical): Not on file  . Lack of Transportation (Non-Medical): Not on file  Physical Activity:   . Days of Exercise per Week: Not on file  . Minutes of Exercise per Session: Not on file  Stress:   . Feeling of Stress : Not on file  Social Connections:   . Frequency of Communication with Friends and Family: Not on file  . Frequency of Social Gatherings with Friends and Family: Not on file  . Attends Religious Services: Not on file  . Active Member of Clubs or Organizations: Not on file  . Attends Archivist Meetings: Not on file  . Marital Status: Not on file   Family History  Problem Relation Age of Onset  . Hypertension Mother   . Heart failure Mother   . Diabetes Mother   . Heart  attack Brother   . Heart attack Maternal Aunt    Allergies  Allergen Reactions  . Simvastatin Other (See Comments)    "caused neuropathy pain"   Prior to Admission medications   Medication Sig Start Date End Date Taking? Authorizing Provider  aspirin 81 MG tablet Take 81 mg by mouth at bedtime.     [provider]  finasteride (PROSCAR) 5 MG tablet  07/27/17   [provider]  folic acid (FOLVITE) 751 MCG tablet Take 800 mcg by mouth daily.     [provider]  Multiple Vitamins-Minerals (CENTRUM SILVER PO) Take 1 tablet by mouth daily.     [provider]  sulfaSALAzine (AZULFIDINE) 500 MG tablet Take 2,000 mg by mouth daily.     [provider]  tamsulosin (FLOMAX) 0.4 MG CAPS Take 0.4 mg by mouth 2 (two) times daily.     [provider]     Positive ROS:  Otherwise negative  All other systems have been reviewed and were otherwise negative with the exception of those mentioned in the HPI and as above.  Physical Exam: Constitutional: Alert, well-appearing, no acute distress Ears: External ears without lesions or tenderness. Ear canals are clear bilaterally with intact, clear TMs.  Nasal: External nose without lesions. Clear nasal passages bilaterally with clear mucus discharge.  Both middle meatus regions are clear. Oral: Lips and gums without lesions. Tongue and palate mucosa without lesions. Posterior oropharynx clear. Neck: No palpable adenopathy or masses Respiratory: Breathing comfortably  Skin: No facial/neck lesions or rash noted.  Audiologic testing in the office today revealed a bilateral symmetric downsloping moderate to severe hearing loss with SRT's of 45 dB bilaterally.  Procedures  Assessment: Bilateral sensorineural hearing loss consistent with presbycusis and secondary tinnitus. Mild rhinitis  Plan: Reviewed with [name] [name] today concerning tinnitus and treatment with masking noise.  Also gave him some samples of Lipo flavonoid to try as this is beneficial in some people with tinnitus. Recommended hearing aids as he has difficulty hearing people at normal speech level.  Radene Journey, MD

## 2019-08-21 DIAGNOSIS — C61 Malignant neoplasm of prostate: Secondary | ICD-10-CM | POA: Diagnosis not present

## 2019-08-22 ENCOUNTER — Encounter (INDEPENDENT_AMBULATORY_CARE_PROVIDER_SITE_OTHER): Payer: Self-pay

## 2019-08-30 DIAGNOSIS — C61 Malignant neoplasm of prostate: Secondary | ICD-10-CM | POA: Diagnosis not present

## 2019-09-01 DIAGNOSIS — R69 Illness, unspecified: Secondary | ICD-10-CM | POA: Diagnosis not present

## 2019-09-03 DIAGNOSIS — E782 Mixed hyperlipidemia: Secondary | ICD-10-CM | POA: Diagnosis not present

## 2019-09-03 DIAGNOSIS — I10 Essential (primary) hypertension: Secondary | ICD-10-CM | POA: Diagnosis not present

## 2019-09-03 DIAGNOSIS — N183 Chronic kidney disease, stage 3 unspecified: Secondary | ICD-10-CM | POA: Diagnosis not present

## 2019-09-03 DIAGNOSIS — C61 Malignant neoplasm of prostate: Secondary | ICD-10-CM | POA: Diagnosis not present

## 2019-09-03 DIAGNOSIS — Z8546 Personal history of malignant neoplasm of prostate: Secondary | ICD-10-CM | POA: Diagnosis not present

## 2019-09-03 DIAGNOSIS — M19011 Primary osteoarthritis, right shoulder: Secondary | ICD-10-CM | POA: Diagnosis not present

## 2019-09-07 ENCOUNTER — Other Ambulatory Visit (HOSPITAL_COMMUNITY): Payer: Self-pay | Admitting: Urology

## 2019-09-07 DIAGNOSIS — R9721 Rising PSA following treatment for malignant neoplasm of prostate: Secondary | ICD-10-CM

## 2019-09-18 ENCOUNTER — Other Ambulatory Visit: Payer: Self-pay

## 2019-09-18 ENCOUNTER — Ambulatory Visit (HOSPITAL_COMMUNITY)
Admission: RE | Admit: 2019-09-18 | Discharge: 2019-09-18 | Disposition: A | Discharge status: Home / Self Care | Payer: Medicare HMO | Source: Ambulatory Visit | Attending: Urology | Admitting: Urology

## 2019-09-18 DIAGNOSIS — R9721 Rising PSA following treatment for malignant neoplasm of prostate: Secondary | ICD-10-CM

## 2019-09-18 DIAGNOSIS — C61 Malignant neoplasm of prostate: Secondary | ICD-10-CM | POA: Diagnosis not present

## 2019-09-18 MED ORDER — AXUMIN (FLUCICLOVINE F 18) INJECTION
9.3000 | Freq: Once | INTRAVENOUS | Status: AC
  Administered 2019-09-18: 9.3 via INTRAVENOUS

## 2019-09-19 DIAGNOSIS — H903 Sensorineural hearing loss, bilateral: Secondary | ICD-10-CM | POA: Diagnosis not present

## 2019-09-28 ENCOUNTER — Ambulatory Visit (INDEPENDENT_AMBULATORY_CARE_PROVIDER_SITE_OTHER): Payer: Medicare HMO | Admitting: Otolaryngology

## 2019-09-28 ENCOUNTER — Other Ambulatory Visit: Payer: Self-pay

## 2019-09-28 VITALS — Temp 97.9°F

## 2019-09-28 DIAGNOSIS — H9311 Tinnitus, right ear: Secondary | ICD-10-CM

## 2019-09-28 DIAGNOSIS — H903 Sensorineural hearing loss, bilateral: Secondary | ICD-10-CM | POA: Diagnosis not present

## 2019-09-28 NOTE — Progress Notes (Signed)
HPI: [name] [name] is a 86 y.o. male who presents is referred by [facility] audiology for evaluation of complaints of worse tinnitus in his right ear.  On audiologic testing his pure-tone's were fairly symmetric although he had slight worsening discrimination scores on the right compared to the left.  His main complaint is the tinnitus or ringing in the right ear.  On repeat audiologic testing apparently they noticed a decline in his W RS on the right side and recommended consideration of a MRI scan to rule out retrocochlear pathology.  I reviewed the audiogram and this demonstrated again symmetric appearing pure tones with significant upper frequency SNHL in both the left and right ear at approximately 70 dB which I felt would account for the tinnitus as well as poor word discrimination.  I think the likelihood of an acoustic neuroma would be low and even if he had an acoustic neuroma not sure intervention would be warranted at his age.  Reviewed this with the patient as well his his wife. He has had no headache and no vertigo or significant balance problems.  If he develops headache or increasing balance problems perhaps consider MRI scan..  Past Medical History:  Diagnosis Date  . Arthritis   . Colitis   . Edema   . Essential hypertension    no med now  . Essential tremor   . Hyperlipidemia   . Hypotonic bladder    L4-L5  . Obstructive uropathy   . Peripheral neuropathy    Elevated by Dr. love 2010  . Prostate cancer ([place]) 2005   Past Surgical History:  Procedure Laterality Date  . ACHILLES TENDON SURGERY Bilateral 1995  . COLONOSCOPY     With polyp resection  . REVERSE SHOULDER ARTHROPLASTY Right 06/04/2016  . REVERSE SHOULDER ARTHROPLASTY Right 06/04/2016   Procedure: REVERSE SHOULDER ARTHROPLASTY;  Surgeon: Netta Cedars, MD;  Location: [place];  Service: Orthopedics;  Laterality: Right;  . SKIN CANCER EXCISION  1974   On chest wall  . TOTAL KNEE ARTHROPLASTY Right 2011  . TOTAL KNEE  ARTHROPLASTY Left 04/09/2013   Procedure: LEFT TOTAL KNEE ARTHROPLASTY;  Surgeon: Gearlean Alf, MD;  Location: [facility];  Service: Orthopedics;  Laterality: Left;   Social History   Socioeconomic History  . Marital status: Married    Spouse name: Not on file  . Number of children: Not on file  . Years of education: Not on file  . Highest education level: Not on file  Occupational History  . Not on file  Tobacco Use  . Smoking status: Never Smoker  . Smokeless tobacco: Never Used  Substance and Sexual Activity  . Alcohol use: Yes    Alcohol/week: 1.0 standard drinks    Types: 1 Glasses of wine per week    Comment: occ  . Drug use: No  . Sexual activity: Yes  Other Topics Concern  . Not on file  Social History Narrative  . Not on file   Social Determinants of Health   Financial Resource Strain:   . Difficulty of Paying Living Expenses:   Food Insecurity:   . Worried About Charity fundraiser in the Last Year:   . Arboriculturist in the Last Year:   Transportation Needs:   . Film/video editor (Medical):   Marland Kitchen Lack of Transportation (Non-Medical):   Physical Activity:   . Days of Exercise per Week:   . Minutes of Exercise per Session:   Stress:   . Feeling  of Stress :   Social Connections:   . Frequency of Communication with Friends and Family:   . Frequency of Social Gatherings with Friends and Family:   . Attends Religious Services:   . Active Member of Clubs or Organizations:   . Attends Archivist Meetings:   Marland Kitchen Marital Status:    Family History  Problem Relation Age of Onset  . Hypertension Mother   . Heart failure Mother   . Diabetes Mother   . Heart attack Brother   . Heart attack Maternal Aunt    Allergies  Allergen Reactions  . Simvastatin Other (See Comments)    "caused neuropathy pain"   Prior to Admission medications   Medication Sig Start Date End Date Taking? Authorizing Provider  aspirin 81 MG tablet Take 81 mg by mouth at  bedtime.     [provider]  finasteride (PROSCAR) 5 MG tablet  07/27/17   [provider]  folic acid (FOLVITE) 342 MCG tablet Take 800 mcg by mouth daily.     [provider]  Multiple Vitamins-Minerals (CENTRUM SILVER PO) Take 1 tablet by mouth daily.     [provider]  sulfaSALAzine (AZULFIDINE) 500 MG tablet Take 2,000 mg by mouth daily.     [provider]  tamsulosin (FLOMAX) 0.4 MG CAPS Take 0.4 mg by mouth 2 (two) times daily.     [provider]     Positive ROS: Otherwise negative  All other systems have been reviewed and were otherwise negative with the exception of those mentioned in the HPI and as above.  Physical Exam: Constitutional: Alert, well-appearing, no acute distress Ears: External ears without lesions or tenderness. Ear canals are clear bilaterally with intact, clear TMs.  Nasal: External nose without lesions. Septum straight. Clear nasal passages Oral: Lips and gums without lesions. Tongue and palate mucosa without lesions. Posterior oropharynx clear. Neck: No palpable adenopathy or masses Respiratory: Breathing comfortably  Skin: No facial/neck lesions or rash noted.  Procedures  Assessment: Patient with bilateral SNHL which is worse in the upper frequencies with the worst tinnitus on the right side.  Plan: Recommended hearing aids which he is in the process of getting. I am not sure MRI scan is warranted at this time as retrocochlear pathology would be a benign lesion and would not warrant intervention at his age unless he became more symptomatic.  I think the likelihood of retrocochlear pathology would be low.   Radene Journey, MD   CC:

## 2019-10-02 ENCOUNTER — Encounter (INDEPENDENT_AMBULATORY_CARE_PROVIDER_SITE_OTHER): Payer: Self-pay

## 2019-10-10 DIAGNOSIS — I499 Cardiac arrhythmia, unspecified: Secondary | ICD-10-CM | POA: Diagnosis not present

## 2019-10-10 DIAGNOSIS — R1310 Dysphagia, unspecified: Secondary | ICD-10-CM | POA: Diagnosis not present

## 2019-10-12 DIAGNOSIS — R1310 Dysphagia, unspecified: Secondary | ICD-10-CM | POA: Diagnosis not present

## 2019-10-12 DIAGNOSIS — I10 Essential (primary) hypertension: Secondary | ICD-10-CM | POA: Diagnosis not present

## 2019-10-12 DIAGNOSIS — R5383 Other fatigue: Secondary | ICD-10-CM | POA: Diagnosis not present

## 2019-10-12 DIAGNOSIS — J189 Pneumonia, unspecified organism: Secondary | ICD-10-CM | POA: Diagnosis not present

## 2019-10-18 DIAGNOSIS — H903 Sensorineural hearing loss, bilateral: Secondary | ICD-10-CM | POA: Diagnosis not present

## 2019-10-19 DIAGNOSIS — M25571 Pain in right ankle and joints of right foot: Secondary | ICD-10-CM | POA: Diagnosis not present

## 2019-10-19 DIAGNOSIS — M25572 Pain in left ankle and joints of left foot: Secondary | ICD-10-CM | POA: Diagnosis not present

## 2019-10-26 DIAGNOSIS — R609 Edema, unspecified: Secondary | ICD-10-CM | POA: Diagnosis not present

## 2019-10-26 DIAGNOSIS — N289 Disorder of kidney and ureter, unspecified: Secondary | ICD-10-CM | POA: Diagnosis not present

## 2019-10-26 DIAGNOSIS — J189 Pneumonia, unspecified organism: Secondary | ICD-10-CM | POA: Diagnosis not present

## 2019-11-07 DIAGNOSIS — C61 Malignant neoplasm of prostate: Secondary | ICD-10-CM | POA: Diagnosis not present

## 2019-11-07 DIAGNOSIS — N312 Flaccid neuropathic bladder, not elsewhere classified: Secondary | ICD-10-CM | POA: Diagnosis not present

## 2019-11-07 DIAGNOSIS — R338 Other retention of urine: Secondary | ICD-10-CM | POA: Diagnosis not present

## 2019-11-14 DIAGNOSIS — R609 Edema, unspecified: Secondary | ICD-10-CM | POA: Diagnosis not present

## 2019-11-14 DIAGNOSIS — I10 Essential (primary) hypertension: Secondary | ICD-10-CM | POA: Diagnosis not present

## 2019-11-14 DIAGNOSIS — R0600 Dyspnea, unspecified: Secondary | ICD-10-CM | POA: Diagnosis not present

## 2019-11-14 DIAGNOSIS — J189 Pneumonia, unspecified organism: Secondary | ICD-10-CM | POA: Diagnosis not present

## 2019-11-15 ENCOUNTER — Other Ambulatory Visit: Payer: Self-pay | Admitting: Family Medicine

## 2019-11-15 ENCOUNTER — Other Ambulatory Visit (HOSPITAL_COMMUNITY): Payer: Self-pay | Admitting: Family Medicine

## 2019-11-15 ENCOUNTER — Encounter (HOSPITAL_COMMUNITY): Payer: Self-pay

## 2019-11-15 ENCOUNTER — Other Ambulatory Visit: Payer: Self-pay

## 2019-11-15 ENCOUNTER — Telehealth: Payer: Self-pay | Admitting: Internal Medicine

## 2019-11-15 ENCOUNTER — Ambulatory Visit (HOSPITAL_COMMUNITY)
Admission: RE | Admit: 2019-11-15 | Discharge: 2019-11-15 | Disposition: A | Discharge status: Home / Self Care | Payer: Medicare HMO | Source: Ambulatory Visit | Attending: Family Medicine | Admitting: Family Medicine

## 2019-11-15 DIAGNOSIS — J189 Pneumonia, unspecified organism: Secondary | ICD-10-CM

## 2019-11-15 DIAGNOSIS — R0609 Other forms of dyspnea: Secondary | ICD-10-CM

## 2019-11-15 DIAGNOSIS — R0602 Shortness of breath: Secondary | ICD-10-CM

## 2019-11-15 DIAGNOSIS — R0600 Dyspnea, unspecified: Secondary | ICD-10-CM

## 2019-11-15 DIAGNOSIS — J90 Pleural effusion, not elsewhere classified: Secondary | ICD-10-CM | POA: Diagnosis not present

## 2019-11-15 MED ORDER — IOHEXOL 300 MG/ML  SOLN
75.0000 mL | Freq: Once | INTRAMUSCULAR | Status: AC | PRN
  Administered 2019-11-15: 75 mL via INTRAVENOUS

## 2019-11-15 MED ORDER — SODIUM CHLORIDE (PF) 0.9 % IJ SOLN
INTRAMUSCULAR | Status: AC
  Filled 2019-11-15: qty 50

## 2019-11-15 NOTE — Progress Notes (Signed)
Cardiology Office Note:    Date:  11/19/2019   ID:  [name] [name], DOB 1927-08-19, MRN [id number]  PCP:  [name], MD  Cardiologist:  Peter Martinique, MD  Electrophysiologist:  None   Referring MD: [name], MD   Chief Complaint: lower extremity edema and concerns for CHF  History of Present Illness:    [name] [name] is a 86 y.o. male with a history of venous insufficiency with chronic lower extremity edema (mostly on the left), hypertension, hyperlipidemia, peripheral neuropathy, essential tremor, and prostate cancer who is followed by Dr. Martinique and present today for evaluation of lower extremity edema.   Patient first seen by Dr. Martinique in 09/2012 for evaluation of dyspnea on exertion and lower extremity edema, especially in his left leg. Venous dopplers were ordered and showed no DVT. Echo showed LVEF of 55-60% with moderate asymmetric basal septal hypertrophy with no LV outflow tract gradient and mild to moderate MR. Patient was last seen by Dr. Martinique in 07/2017 at which time he was doing well. He denied any chest pain or shortness of breath. He continued to report some lower extremity edema that resolved with elevation of legs at night. Edema felt to be due to venous insufficiency and conservative management with sodium restriction, elevation of legs, and compression stockings was recommended. He was advised to follow-up as needed.   Patient presents today at the advise of his PCP due to concerns for CHF. Per reviewed records faxed from PCP's office. Patient initially diagnosed with right middle lobe pneumonia on 10/12/2019 after presenting for further evaluation of fatigue while on vacation at the beach.. He tested negative for COVID. He was started on Levaquin but this had to be stopped when he developed tendonitis. He was started on Azithromycin instead on 10/26/2019. Repeat chest x-ray on 10/26/2019 showed increased density of right lower lobe pneumonia. However, fatigue persistent and  patient developed significant lower extremity edema and also reported weight gain. Patient was seen by PCP again on 11/14/2019. Repeat chest x-ray showed continued right basilar pneumonia and increased right pleural effusion. BNP was checked and came back elevated at 1,380. He was started on Lasix 34m daily. Chest CT was also ordered to rule out underlying lung mass. CT was performed on 11/15/2019 and showed mild cardiomegaly, moderate right pleural effusion with adjacent atelectasis or pneumonia in the right lower and middle lobes as well as coronary artery calcifications. Of note, prior to being started on Lasix, he was seen by Urology for urinary retention and had a catheter placed. This is still in place.  Patient presents today for follow-up. Here with his wife. He continues to have intermittent shortness of breath but states it has improved with the Lasix. He notes some orthopnea but no PND. His lower extremity edema has also improved. He states right leg is almost back to normal size but left leg is still a little larger than usual. He denies any chest pain. No palpitations, lightheadedness, dizziness, or syncope. His biggest complaint today is continued significant fatigue that is interfering with his daily activities. No fevers. He has a mild intermittent cough. No abnormal bleeding in his urine or stools.   Reviewed Labs from PCP: - 10/12/2019: WBC 7.6, Hgb 14.2, Plts 246. Na 140, K 4.4, Glucose 103, BUN 12, Cr 1.26. AST 29, ALT 21, Alk Phos 61, Total Bili 0.5. TSH 0.849. UA showed 367mof protein but was otherwise unremarkable.  - 10/26/2019: Cr 1.20.  - 11/14/2019: BNP 1,380. Na 140, K  4.6, Glucose 107, BUN 22, Cr 1.16. AST 26, ALT, Alk Phos 59.   Past Medical History:  Diagnosis Date  . Arthritis   . Colitis   . Edema   . Essential hypertension    no med now  . Essential tremor   . Hyperlipidemia   . Hypotonic bladder    L4-L5  . Obstructive uropathy   . Peripheral neuropathy     Elevated by Dr. love 2010  . Prostate cancer ([place]) 2005    Past Surgical History:  Procedure Laterality Date  . ACHILLES TENDON SURGERY Bilateral 1995  . COLONOSCOPY     With polyp resection  . REVERSE SHOULDER ARTHROPLASTY Right 06/04/2016  . REVERSE SHOULDER ARTHROPLASTY Right 06/04/2016   Procedure: REVERSE SHOULDER ARTHROPLASTY;  Surgeon: Netta Cedars, MD;  Location: [place];  Service: Orthopedics;  Laterality: Right;  . SKIN CANCER EXCISION  1974   On chest wall  . TOTAL KNEE ARTHROPLASTY Right 2011  . TOTAL KNEE ARTHROPLASTY Left 04/09/2013   Procedure: LEFT TOTAL KNEE ARTHROPLASTY;  Surgeon: Gearlean Alf, MD;  Location: [facility];  Service: Orthopedics;  Laterality: Left;    Current Medications: Current Meds  Medication Sig  . aspirin 81 MG tablet Take 81 mg by mouth at bedtime.   . finasteride (PROSCAR) 5 MG tablet   . folic acid (FOLVITE) 431 MCG tablet Take 800 mcg by mouth daily.   . furosemide (LASIX) 40 MG tablet Take 80 mg by mouth daily.  . Multiple Vitamins-Minerals (CENTRUM SILVER PO) Take 1 tablet by mouth daily.   Marland Kitchen sulfaSALAzine (AZULFIDINE) 500 MG tablet Take 2,000 mg by mouth daily.   . tamsulosin (FLOMAX) 0.4 MG CAPS Take 0.4 mg by mouth 2 (two) times daily.      Allergies:   Simvastatin   Social History   Socioeconomic History  . Marital status: Married    Spouse name: Not on file  . Number of children: Not on file  . Years of education: Not on file  . Highest education level: Not on file  Occupational History  . Not on file  Tobacco Use  . Smoking status: Never Smoker  . Smokeless tobacco: Never Used  Substance and Sexual Activity  . Alcohol use: Yes    Alcohol/week: 1.0 standard drinks    Types: 1 Glasses of wine per week    Comment: occ  . Drug use: No  . Sexual activity: Yes  Other Topics Concern  . Not on file  Social History Narrative  . Not on file   Social Determinants of Health   Financial Resource Strain:   . Difficulty of  Paying Living Expenses:   Food Insecurity:   . Worried About Charity fundraiser in the Last Year:   . Arboriculturist in the Last Year:   Transportation Needs:   . Film/video editor (Medical):   Marland Kitchen Lack of Transportation (Non-Medical):   Physical Activity:   . Days of Exercise per Week:   . Minutes of Exercise per Session:   Stress:   . Feeling of Stress :   Social Connections:   . Frequency of Communication with Friends and Family:   . Frequency of Social Gatherings with Friends and Family:   . Attends Religious Services:   . Active Member of Clubs or Organizations:   . Attends Archivist Meetings:   Marland Kitchen Marital Status:      Family History: The patient's family history includes Diabetes in his  mother; Heart attack in his brother and maternal aunt; Heart failure in his mother; Hypertension in his mother.  ROS:   Please see the history of present illness.    All other systems reviewed and are negative.  EKGs/Labs/Other Studies Reviewed:    The following studies were reviewed today:  Echocardiogram 10/02/2012: Study Conclusions: - Left ventricle: The cavity size was normal. There was  moderate focal basal hypertrophy. Systolic function was  normal. The estimated ejection fraction was in the range  of 55% to 60%. Wall motion was normal; there were no  regional wall motion abnormalities. Doppler parameters are  consistent with abnormal left ventricular relaxation  (grade 1 diastolic dysfunction).  - Aortic valve: Trileaflet; moderately calcified leaflets.  Sclerosis without stenosis. There was no stenosis. Trivial  regurgitation.  - Aorta: Mildly dilated aortic root. Aortic root dimension:  76m (ED).  - Mitral valve: Mildly calcified leaflets . Mild to moderate  regurgitation.  - Left atrium: The atrium was moderately dilated.  - Right ventricle: The cavity size was normal. Systolic  function was normal.  - Right atrium: The atrium was  mildly dilated.  - Pulmonary arteries: PA peak pressure: 321mHg (S).  - Inferior vena cava: The vessel was normal in size; the  respirophasic diameter changes were in the normal range (=  50%); findings are consistent with normal central venous  pressure.   Impressions: - Normal LV size with moderateasymmetric basal septal  hypertrophy. EF 55-60%. No LV outflow tract gradient was  noted. Aortic sclerosis without stenosis. Mild to moderate  mitral regurgitation. Normal RV size and systolic  function. _______________  Lower Extremity Venous Dopplers 10/04/2012: No evidence of DVT.  EKG:  EKG ordered today. EKG personally reviewed and demonstrates normal sinus rhythm, rate 96 bpm, with PVC. Some underlying artifact but no acute ST/T changes. QT 380 to 40084m Recent Labs: No results found for requested labs within last 8760 hours.  Recent Lipid Panel No results found for: CHOL, TRIG, HDL, CHOLHDL, VLDL, LDLCALC, LDLDIRECT  Physical Exam:    Vital Signs: BP 102/64 (BP Location: Right Arm, Patient Position: Sitting, Cuff Size: Normal)   Pulse 96   Wt 189 lb (85.7 kg)   BMI 27.51 kg/m     Wt Readings from Last 3 Encounters:  11/19/19 189 lb (85.7 kg)  08/22/17 181 lb 9.6 oz (82.4 kg)  06/30/16 185 lb 6 oz (84.1 kg)     General: [age]. male in no acute distress. HEENT: Normocephalic and atraumatic. Sclera clear. EOMs intact. Neck: Supple. No carotid bruits. No JVD. Heart: RRR. Distinct S1 and S2. Possible soft murmur. No gallops or rubs. Radial pulses 2+ and equal bilaterally. Lungs: No increased work of breathing. Clear to ausculation bilaterally. Very mild crackles noted in bilateral bases with decreased breath sounds on the right. No wheezes or rhonchi. Abdomen: Soft, non-distended, and non-tender to palpation. Bowel sounds present. Extremities: 1+ lower extremity edema (left > right).    Skin: Warm and dry. Neuro: Alert and oriented x3. No focal  deficits. Psych: Normal affect. Responds appropriately.  Assessment:    1. [place] 2. Recurrent right pleural effusion   3. Pneumonia of right lower lobe due to infectious organism   4. Fatigue, unspecified type   5. PVC (premature ventricular contraction)   6. Chronic venous insufficiency   7. Hyperlipidemia, unspecified hyperlipidemia type   8. Essential hypertension   9. Atherosclerosis of native coronary artery of native  heart without angina pectoris     Plan:    Decompensated CHF (Etiology Unclear) / Right Pleural Effusion - CT scan on 11/15/2019 showed mild cardiomegaly with moderate right pleural effusion and adjacent atelectasis or pneumonia in the right lower and middle lobes. Left lung clear.  - BNP elevated at 1,380 at PCP's office on 11/14/2019. Started on Lasix 66m daily with improvement.  - Will recheck BNP today and order Echo. - Continue Lasix 862mdaily for now.  - Will recheck  BMET today to make sure renal function and potassium are stable.  - If EF is low, will need to try to add ARB and beta-blocker if BP will allow. Given PVCs would likely add beta-blocker first but will hold off for now given soft BP and need for further diuresis.  - Discussed importance of daily weights and sodium/fluid restriction. - Patient scheduled to see Pulmonology later this week. Will defer to them about whether thoracentesis is needed for pleural effusion.   Right Lower Lobe Pneumonia - Initially diagnosed in 10/12/2019 but has had persistent symptoms and infiltrates on imaging. - Continue antibiotics per PCP.  Fatigue - Patient biggest complaint today is his continued significant fatigue. Explained that this could be due to persistent pneumonia and/or CHF. - TSH in 09/2019 was normal.  - Will recheck CBC today.  - Continue treatments for CHF and pneumonia as stated above.  PVCs - Patient having frequent PVCs. On first EKG today (not scanned into  chart due to too much artifact) patient having trigeminy PVCs. - Will check BMET and Magnesium. - Will hold off on starting beta-blocker for now given soft BP and need for diuresis.   Chronic Venous Insufficiency - Patient has history chronic lower extremity edema (left > right) which has been felt to be due to venous insufficiency in the past. However, looks like he now also has a CHF component. - Continue Lasix as above. - Continue conservative management with elevation of feet, compression stockings, and sodium restriction.  Hypertension - BP soft but stable at 102/64 without any antihypertensives.  Hyperlipidemia - [facility] Cholesterol 203, Triglycerides 171, HDL 63, LDL 111.  - Given coronary artery calcification on recent CT scan, would ideally be on statin. Looks like he has been on Simvastatin in the past but did not tolerate this well due to neuropathy. Did not discuss retrying a statin at this visit given acute needs but can discuss at follow-up.  Coronary Artery Calcifications - Coronary artery calcification noted on recent CT scan.  - No angina. - Continue Aspirin 8115maily.  - Considering retrying statin as above.  Disposition: Follow up in 3-4 weeks after Echo.   Medication Adjustments/Labs and Tests Ordered: Current medicines are reviewed at length with the patient today.  Concerns regarding medicines are outlined above.  Orders Placed This Encounter  Procedures  . Basic metabolic panel  . Brain natriuretic peptide  . CBC  . Magnesium  . EKG 12-Lead  . ECHOCARDIOGRAM COMPLETE   No orders of the defined types were placed in this encounter.   Patient Instructions  Medication Instructions:  CONTINUE LASIX AS DIRECTED *If you need a refill on your cardiac medications before your next appointment, please call your pharmacy*  Lab Work: BMET,BNP,CBC AND MAG TODAY HERE IN OUR OFFICE If you have labs (blood work) drawn today and  your tests are completely normal, you will receive your results only by:  [facility] you have MyChart)  OR A paper copy in the mail.  If you have any lab test that is abnormal or we need to change your treatment, we will call you to review the results. You may go to any [facility] that is convenient for you however, we do have a lab in our office that is able to assist you. You DO NOT need an appointment for our lab. The lab is open 8:00am and closes at 4:00pm. Lunch 12:45 - 1:45pm.  Testing/Procedures: Echocardiogram - Your physician has requested that you have an echocardiogram. Echocardiography is a painless test that uses sound waves to create images of your heart. It provides your doctor with information about the size and shape of your heart and how well your heart's chambers and valves are working. This procedure takes approximately one hour. There are no restrictions for this procedure. This will be performed at our [facility] location - [address].  Special Instructions PLEASE READ AND FOLLOW SALTY 6-ATTACHED  Heart Failure Education: 1. Weigh yourself EVERY morning after you go to the bathroom but before you eat or drink anything. Write this number down in a weight log/diary. If you gain 3 pounds overnight or 5 pounds in a week, call the office. 2. Take your medicines as prescribed. If you have concerns about your medications, please call us before you stop taking them.  3. Eat low salt foods--Limit salt (sodium) to 2000 mg per day. This will help prevent your body from holding onto fluid. Read food labels as many processed foods have a lot of sodium, especially canned goods and prepackaged meats. If you would like some assistance choosing low sodium foods, we would be happy to set you up with a nutritionist. 4. Stay as active as you can everyday. Staying active will give you more energy and make your muscles stronger. Start with 5 minutes at a time and work your way up to  30 minutes a day. Break up your activities--do some in the morning and some in the afternoon. Start with 3 days per week and work your way up to 5 days as you can.  If you have chest pain, feel short of breath, dizzy, or lightheaded, STOP. If you don't feel better after a short rest, call 911. If you do feel better, call the office to let us know you have symptoms with exercise. 5. Limit all fluids for the day to less than 2 liters. Fluid includes all drinks, coffee, juice, ice chips, soup, jello, and all other liquids.  Follow-Up: Your next appointment:  3-4 week(s) (MAKE SURE AFTER ECHO) In Person with Peter Martinique, MD.  At [facility], you and your health needs are our priority.  As part of our continuing mission to provide you with exceptional heart care, we have created designated Provider Care Teams.  These Care Teams include your primary Cardiologist (physician) and Advanced Practice Providers (APPs -  Physician Assistants and Nurse Practitioners) who all work together to provide you with the care you need, when you need it.          Signed, Darreld Mclean, PA-C  11/19/2019 7:12 PM    [facility]

## 2019-11-15 NOTE — Telephone Encounter (Signed)
There are no sooner appts for consult with Dr Tamala Julian. I called and spoke with the pt and his spouse and advised that if he wants something sooner we are happy to get him scheduled with another one of Dr Thompson Caul partners. Pt wants ASAP appt.  I have scheduled him with Dr Shearon Stalls for 11/21/19.  Will forward to [place] to let her know in case needs to link appt to referral, thanks!

## 2019-11-18 DIAGNOSIS — I10 Essential (primary) hypertension: Secondary | ICD-10-CM | POA: Diagnosis not present

## 2019-11-18 DIAGNOSIS — M19011 Primary osteoarthritis, right shoulder: Secondary | ICD-10-CM | POA: Diagnosis not present

## 2019-11-18 DIAGNOSIS — N183 Chronic kidney disease, stage 3 unspecified: Secondary | ICD-10-CM | POA: Diagnosis not present

## 2019-11-18 DIAGNOSIS — E782 Mixed hyperlipidemia: Secondary | ICD-10-CM | POA: Diagnosis not present

## 2019-11-18 DIAGNOSIS — Z8546 Personal history of malignant neoplasm of prostate: Secondary | ICD-10-CM | POA: Diagnosis not present

## 2019-11-18 DIAGNOSIS — C61 Malignant neoplasm of prostate: Secondary | ICD-10-CM | POA: Diagnosis not present

## 2019-11-19 ENCOUNTER — Ambulatory Visit: Payer: Medicare HMO | Admitting: Student

## 2019-11-19 ENCOUNTER — Encounter: Payer: Self-pay | Admitting: Student

## 2019-11-19 ENCOUNTER — Other Ambulatory Visit: Payer: Self-pay

## 2019-11-19 VITALS — BP 102/64 | HR 96 | Wt 189.0 lb

## 2019-11-19 DIAGNOSIS — J189 Pneumonia, unspecified organism: Secondary | ICD-10-CM | POA: Diagnosis not present

## 2019-11-19 DIAGNOSIS — I493 Ventricular premature depolarization: Secondary | ICD-10-CM

## 2019-11-19 DIAGNOSIS — I10 Essential (primary) hypertension: Secondary | ICD-10-CM | POA: Diagnosis not present

## 2019-11-19 DIAGNOSIS — R5383 Other fatigue: Secondary | ICD-10-CM | POA: Diagnosis not present

## 2019-11-19 DIAGNOSIS — I509 Heart failure, unspecified: Secondary | ICD-10-CM | POA: Diagnosis not present

## 2019-11-19 DIAGNOSIS — E785 Hyperlipidemia, unspecified: Secondary | ICD-10-CM

## 2019-11-19 DIAGNOSIS — J90 Pleural effusion, not elsewhere classified: Secondary | ICD-10-CM | POA: Diagnosis not present

## 2019-11-19 DIAGNOSIS — I2510 Atherosclerotic heart disease of native coronary artery without angina pectoris: Secondary | ICD-10-CM

## 2019-11-19 DIAGNOSIS — I872 Venous insufficiency (chronic) (peripheral): Secondary | ICD-10-CM

## 2019-11-19 DIAGNOSIS — Z79899 Other long term (current) drug therapy: Secondary | ICD-10-CM | POA: Diagnosis not present

## 2019-11-19 NOTE — Patient Instructions (Addendum)
Medication Instructions:  CONTINUE LASIX AS DIRECTED *If you need a refill on your cardiac medications before your next appointment, please call your pharmacy*  Lab Work: BMET,BNP,CBC AND MAG TODAY HERE IN OUR OFFICE If you have labs (blood work) drawn today and your tests are completely normal, you will receive your results only by:  [place] (if you have MyChart) OR A paper copy in the mail.  If you have any lab test that is abnormal or we need to change your treatment, we will call you to review the results. You may go to any [facility] that is convenient for you however, we do have a lab in our office that is able to assist you. You DO NOT need an appointment for our lab. The lab is open 8:00am and closes at 4:00pm. Lunch 12:45 - 1:45pm.  Testing/Procedures: Echocardiogram - Your physician has requested that you have an echocardiogram. Echocardiography is a painless test that uses sound waves to create images of your heart. It provides your doctor with information about the size and shape of your heart and how well your heart's chambers and valves are working. This procedure takes approximately one hour. There are no restrictions for this procedure. This will be performed at our [facility] location - [address].  Special Instructions PLEASE READ AND FOLLOW SALTY 6-ATTACHED  Heart Failure Education: 1. Weigh yourself EVERY morning after you go to the bathroom but before you eat or drink anything. Write this number down in a weight log/diary. If you gain 3 pounds overnight or 5 pounds in a week, call the office. 2. Take your medicines as prescribed. If you have concerns about your medications, please call us before you stop taking them.  3. Eat low salt foods--Limit salt (sodium) to 2000 mg per day. This will help prevent your body from holding onto fluid. Read food labels as many processed foods have a lot of sodium, especially canned goods and prepackaged meats. If you would  like some assistance choosing low sodium foods, we would be happy to set you up with a nutritionist. 4. Stay as active as you can everyday. Staying active will give you more energy and make your muscles stronger. Start with 5 minutes at a time and work your way up to 30 minutes a day. Break up your activities--do some in the morning and some in the afternoon. Start with 3 days per week and work your way up to 5 days as you can.  If you have chest pain, feel short of breath, dizzy, or lightheaded, STOP. If you don't feel better after a short rest, call 911. If you do feel better, call the office to let us know you have symptoms with exercise. 5. Limit all fluids for the day to less than 2 liters. Fluid includes all drinks, coffee, juice, ice chips, soup, jello, and all other liquids.  Follow-Up: Your next appointment:  3-4 week(s) (MAKE SURE AFTER ECHO) In Person with Peter Martinique, MD.  At [facility], you and your health needs are our priority.  As part of our continuing mission to provide you with exceptional heart care, we have created designated Provider Care Teams.  These Care Teams include your primary Cardiologist (physician) and Advanced Practice Providers (APPs -  Physician Assistants and Nurse Practitioners) who all work together to provide you with the care you need, when you need it.

## 2019-11-20 LAB — CBC
Hematocrit: 42.9 % (ref 37.5–51.0)
Hemoglobin: 14.5 g/dL (ref 13.0–17.7)
MCH: 31.6 pg (ref 26.6–33.0)
MCHC: 33.8 g/dL (ref 31.5–35.7)
MCV: 94 fL (ref 79–97)
Platelets: 156 10*3/uL (ref 150–450)
RBC: 4.59 x10E6/uL (ref 4.14–5.80)
RDW: 12.7 % (ref 11.6–15.4)
WBC: 6.4 10*3/uL (ref 3.4–10.8)

## 2019-11-20 LAB — BASIC METABOLIC PANEL
BUN/Creatinine Ratio: 15 (ref 10–24)
BUN: 22 mg/dL (ref 8–27)
CO2: 27 mmol/L (ref 20–29)
Calcium: 9.3 mg/dL (ref 8.6–10.2)
Chloride: 99 mmol/L (ref 96–106)
Creatinine, Ser: 1.43 mg/dL — ABNORMAL HIGH (ref 0.76–1.27)
GFR calc Af Amer: 50 mL/min/{1.73_m2} — ABNORMAL LOW (ref >59)
GFR calc non Af Amer: 43 mL/min/{1.73_m2} — ABNORMAL LOW (ref >59)
Glucose: 133 mg/dL — ABNORMAL HIGH (ref 65–99)
Potassium: 4.4 mmol/L (ref 3.5–5.2)
Sodium: 141 mmol/L (ref 134–144)

## 2019-11-20 LAB — MAGNESIUM: Magnesium: 2.3 mg/dL (ref 1.6–2.3)

## 2019-11-20 LAB — BRAIN NATRIURETIC PEPTIDE: BNP: 1139.7 pg/mL — ABNORMAL HIGH (ref 0.0–100.0)

## 2019-11-21 ENCOUNTER — Other Ambulatory Visit: Payer: Self-pay

## 2019-11-21 ENCOUNTER — Ambulatory Visit: Payer: Medicare HMO | Admitting: Internal Medicine

## 2019-11-21 ENCOUNTER — Encounter: Payer: Self-pay | Admitting: Internal Medicine

## 2019-11-21 VITALS — BP 104/56 | HR 94 | Temp 98.3°F | Ht 69.5 in | Wt 187.8 lb

## 2019-11-21 DIAGNOSIS — J90 Pleural effusion, not elsewhere classified: Secondary | ICD-10-CM

## 2019-11-21 NOTE — Progress Notes (Signed)
Shantanu Strauch    [id number]    1928-06-26  Primary Care Physician:Little, Lennette Bihari, MD  Referring Physician: [name], MD [place],  [place] [address] Reason for Consultation: shortness of breath Date of Consultation: 11/21/2019  Chief complaint:   Chief Complaint  Patient presents with  . Consult    Pnu for 1 month, CXR review     HPI: [name] [name] is a 86 y.o. gentleman who presents as a referral from Dr. Rex Kras for new patient evaluation of shortness of breath and an abnormal CT Chest. History of prostate cancer no radiation or chemo on flomax and proscar.   Symptoms started in April when they were on vacation at [facility]. Had decreased appetite, exhaustion, lower extremity edema.  No fevers, chills, night sweats or weight loss. No cough or sputum production. Also started having worsening LE edema around this time.   His wife notes that a year ago he was completely active but this has decreased over the last year, in part due to staying quarantined from covid. No chest pain or palpitations. He also has urinary retention and has a catheter placed about three weeks ago.  Is currently on his third round of abx for presumptive pneumonia.   Saw cardiology a few days ago.  Taking lasix 40 mg once a day now. The swelling in his extremities is coming down a little on the right but still on the left.  Wearing compression hose.   Had a CT scan done 5/20 which demonstrated right sided pleural effusion with adjacent compressive atelectasis. Significant cardiac family history.  Social History   Occupational History  . Not on file  Tobacco Use  . Smoking status: Never Smoker  . Smokeless tobacco: Never Used  Substance and Sexual Activity  . Alcohol use: Yes    Alcohol/week: 1.0 standard drinks    Types: 1 Glasses of wine per week    Comment: occ  . Drug use: No  . Sexual activity: Yes    Relevant family history:  Family History  Problem Relation Age  of Onset  . Hypertension Mother   . Heart failure Mother   . Diabetes Mother   . Heart attack Brother   . Heart attack Maternal Aunt     Past Medical History:  Diagnosis Date  . Arthritis   . Colitis   . Edema   . Essential hypertension    no med now  . Essential tremor   . Hyperlipidemia   . Hypotonic bladder    L4-L5  . Obstructive uropathy   . Peripheral neuropathy    Elevated by Dr. love 2010  . Prostate cancer ([place]) 2005    Past Surgical History:  Procedure Laterality Date  . ACHILLES TENDON SURGERY Bilateral 1995  . COLONOSCOPY     With polyp resection  . REVERSE SHOULDER ARTHROPLASTY Right 06/04/2016  . REVERSE SHOULDER ARTHROPLASTY Right 06/04/2016   Procedure: REVERSE SHOULDER ARTHROPLASTY;  Surgeon: Netta Cedars, MD;  Location: [place];  Service: Orthopedics;  Laterality: Right;  . SKIN CANCER EXCISION  1974   On chest wall  . TOTAL KNEE ARTHROPLASTY Right 2011  . TOTAL KNEE ARTHROPLASTY Left 04/09/2013   Procedure: LEFT TOTAL KNEE ARTHROPLASTY;  Surgeon: Gearlean Alf, MD;  Location: [facility];  Service: Orthopedics;  Laterality: Left;     Physical Exam: Blood pressure (!) 104/56, pulse 94, temperature 98.3 F (36.8 C), temperature source Oral, height 5' 9.5" (1.765  m), weight 187 lb 12.8 oz (85.2 kg), SpO2 94 %. Gen:      No acute distress ENT:  no nasal polyps, mucus membranes moist Lungs:    No increased respiratory effort, symmetric chest wall excursion, clear to auscultation bilaterally, diminished right lung base, no wheezes or crackles CV:         Regular rate and rhythm; no murmurs, rubs, or gallops.  No pedal edema Abd:      + bowel sounds; soft, non-tender; no distension MSK: no acute synovitis of DIP or PIP joints, no mechanics hands.  Skin:      Warm and dry; no rashes Neuro: normal speech, no focal facial asymmetry Psych: alert and oriented x3, normal mood and affect   Data Reviewed/Medical Decision Making:  Independent interpretation of  tests: Imaging: . Review of patient's CT Chest May 24 images revealed right sided pleural effusion. The patient's images have been independently reviewed by me.    In office ultrasound performed today - there is a small simple pleural effusion in the right thorax which is not loculated.    PFTs: None on file  Labs: BNP in may noted to be elevated over 1100 5/24 Lab Results  Component Value Date   NA 141 11/19/2019   K 4.4 11/19/2019   CL 99 11/19/2019   CO2 27 11/19/2019   Lab Results  Component Value Date   WBC 6.4 11/19/2019   HGB 14.5 11/19/2019   HCT 42.9 11/19/2019   MCV 94 11/19/2019   PLT 156 11/19/2019    Immunization status:  Immunization History  Administered Date(s) Administered  . PFIZER SARS-COV-2 Vaccination 07/20/2019, 08/10/2019    . I reviewed prior external note(s) from cardiology and PCP . I reviewed the result(s) of the labs and imaging as noted above.  . I have ordered chest xray  Assessment:  Acute decompensated heart failure Right sided pleural effusion Dyspnea on exertion  Plan/Recommendations: The pleural effusion I imaged with ultrasound guidance is too small for in-office thoracentesis today. He is improving with lasix.I suggest we continue diuretics and have him follow up in 6 weeks with a repeat chest xray.  We discussed disease management and progression at length today. We discussed heart failure management at length today.  He will get an xray before next appt to ensure ongoing resolution of effusion.   I spent 45 minutes in the care of this patient today including pre-charting, chart review, review of results, face-to-face care, coordination of care and communication with consultants etc.).  Return to Care: Return in about 6 weeks (around 01/02/2020), or if symptoms worsen or fail to improve, for shortness of breath, pleural effusion.  Lenice Llamas, MD [facility] [place]  CC: [name], MD

## 2019-11-21 NOTE — Patient Instructions (Addendum)
The patient should have follow up scheduled with APP in 6 weeks.   Prior to next visit patient should have: Chest xray done immediately prior to next appointment (same day or the day before.)   Pleural Effusion Pleural effusion is an abnormal buildup of fluid in the layers of tissue between the lungs and the inside of the chest (pleural space) The two layers of tissue that line the lungs and the inside of the chest are called pleura. Usually, there is no air in the space between the pleura, only a thin layer of fluid. Some conditions can cause a large amount of fluid to build up, which can cause the lung to collapse if untreated. A pleural effusion is usually caused by another disease that requires treatment. What are the causes? Pleural effusion can be caused by:  Heart failure.  Certain infections, such as pneumonia or tuberculosis.  Cancer.  A blood clot in the lung (pulmonary embolism).  Complications from surgery, such as from open heart surgery.  Liver disease (cirrhosis).  Kidney disease. What are the signs or symptoms? In some cases, pleural effusion may cause no symptoms. If symptoms are present, they may include:  Shortness of breath, especially when lying down.  Chest pain. This may get worse when taking a deep breath.  Fever.  Dry, long-lasting (chronic) cough.  Hiccups.  Rapid breathing. An underlying condition that is causing the pleural effusion (such as heart failure, pneumonia, blood clots, tuberculosis, or cancer) may also cause other symptoms. How is this diagnosed? This condition may be diagnosed based on:  Your symptoms and medical history.  A physical exam.  A chest X-ray.  A procedure to use a needle to remove fluid from the pleural space (thoracentesis). This fluid is tested.  Other imaging studies of the chest, such as ultrasound or CT scan. How is this treated? Depending on the cause of your condition, treatment may include:  Treating  the underlying condition that is causing the effusion. When that condition improves, the effusion will also improve. Examples of treatment for underlying conditions include: ? Antibiotic medicines to treat an infection. ? Diuretics or other heart medicines to treat heart failure.  Thoracentesis.  Placing a thin flexible tube under your skin and into your chest to continuously drain the effusion (indwelling pleural catheter).  Surgery to remove the outer layer of tissue from the pleural space (decortication).  A procedure to put medicine into the chest cavity to seal the pleural space and prevent fluid buildup (pleurodesis).  Chemotherapy and radiation therapy, if you have cancerous (malignant) pleural effusion. These treatments are typically used to treat cancer. They kill certain cells in the body. Follow these instructions at home:  Take over-the-counter and prescription medicines only as told by your health care provider.  Ask your health care provider what activities are safe for you.  Keep track of how long you are able to do mild exercise (such as walking) before you get short of breath. Write down this information to share with your health care provider. Your ability to exercise should improve over time.  Do not use any products that contain nicotine or tobacco, such as cigarettes and e-cigarettes. If you need help quitting, ask your health care provider.  Keep all follow-up visits as told by your health care provider. This is important. Contact a health care provider if:  The amount of time that you are able to do mild exercise: ? Decreases. ? Does not improve with time.  You have  a fever. Get help right away if:  You are short of breath.  You develop chest pain.  You develop a new cough. Summary  Pleural effusion is an abnormal buildup of fluid in the layers of tissue between the lungs and the inside of the chest.  Pleural effusion can have many causes, including  heart failure, pulmonary embolism, infections, or cancer.  Symptoms of pleural effusion can include shortness of breath, chest pain, fever, long-lasting (chronic) cough, hiccups, or rapid breathing.  Diagnosis often involves making images of the chest (such as with ultrasound or X-ray) and removing fluid (thoracentesis) to send for testing.  Treatment for pleural effusion depends on what underlying condition is causing it. This information is not intended to replace advice given to you by your health care provider. Make sure you discuss any questions you have with your health care provider. Document Revised: 05/27/2017 Document Reviewed: 02/17/2017 Elsevier Patient Education  2020 [employer].

## 2019-11-25 ENCOUNTER — Other Ambulatory Visit: Payer: Self-pay

## 2019-11-25 ENCOUNTER — Emergency Department (HOSPITAL_COMMUNITY)
Admission: EM | Admit: 2019-11-25 | Discharge: 2019-11-25 | Disposition: A | Discharge status: Home / Self Care | Payer: Medicare HMO | Attending: Emergency Medicine | Admitting: Emergency Medicine

## 2019-11-25 ENCOUNTER — Encounter (HOSPITAL_COMMUNITY): Payer: Self-pay | Admitting: Emergency Medicine

## 2019-11-25 DIAGNOSIS — I10 Essential (primary) hypertension: Secondary | ICD-10-CM | POA: Insufficient documentation

## 2019-11-25 DIAGNOSIS — Z0130 Encounter for examination of blood pressure without abnormal findings: Secondary | ICD-10-CM | POA: Insufficient documentation

## 2019-11-25 DIAGNOSIS — I509 Heart failure, unspecified: Secondary | ICD-10-CM | POA: Insufficient documentation

## 2019-11-25 DIAGNOSIS — Z79899 Other long term (current) drug therapy: Secondary | ICD-10-CM | POA: Diagnosis not present

## 2019-11-25 DIAGNOSIS — I959 Hypotension, unspecified: Secondary | ICD-10-CM | POA: Insufficient documentation

## 2019-11-25 LAB — CBC
HCT: 40.5 % (ref 39.0–52.0)
Hemoglobin: 13.2 g/dL (ref 13.0–17.0)
MCH: 30.6 pg (ref 26.0–34.0)
MCHC: 32.6 g/dL (ref 30.0–36.0)
MCV: 93.8 fL (ref 80.0–100.0)
Platelets: 147 10*3/uL — ABNORMAL LOW (ref 150–400)
RBC: 4.32 MIL/uL (ref 4.22–5.81)
RDW: 14.1 % (ref 11.5–15.5)
WBC: 5.7 10*3/uL (ref 4.0–10.5)
nRBC: 0 % (ref 0.0–0.2)

## 2019-11-25 LAB — BASIC METABOLIC PANEL
Anion gap: 10 (ref 5–15)
BUN: 22 mg/dL (ref 8–23)
CO2: 24 mmol/L (ref 22–32)
Calcium: 8.8 mg/dL — ABNORMAL LOW (ref 8.9–10.3)
Chloride: 104 mmol/L (ref 98–111)
Creatinine, Ser: 1.18 mg/dL (ref 0.61–1.24)
GFR calc Af Amer: >60 mL/min (ref >60)
GFR calc non Af Amer: 54 mL/min — ABNORMAL LOW (ref >60)
Glucose, Bld: 124 mg/dL — ABNORMAL HIGH (ref 70–99)
Potassium: 4.1 mmol/L (ref 3.5–5.1)
Sodium: 138 mmol/L (ref 135–145)

## 2019-11-25 NOTE — ED Triage Notes (Signed)
Patient here from home reporting hypotension at home. Family reports that BP was taken at home and read 90s/50s.

## 2019-11-25 NOTE — ED Notes (Signed)
Discharge paperwork reviewed with pt and pts family.  Pt with no questions at this time.

## 2019-11-27 NOTE — Telephone Encounter (Signed)
Will close encounter since patient was seen by Dr. Shearon Stalls on 5/26.

## 2019-11-29 ENCOUNTER — Telehealth: Payer: Self-pay | Admitting: Student

## 2019-11-29 ENCOUNTER — Ambulatory Visit: Payer: Medicare Other | Admitting: Podiatry

## 2019-11-29 NOTE — Telephone Encounter (Signed)
There must have been a misunderstanding. Other than the Echo and follow-up appointment, I didn't talk about scheduling him anything.   Thanks!

## 2019-11-29 NOTE — Telephone Encounter (Signed)
[place] is calling requesting to speak with [name] [name] stating it is in regards to a special appointment she was supposed to be scheduling for him. I asked him if it was in regards to his echo and he stated it was an appointment that was supposed to be scheduled prior to that. Please advise.

## 2019-11-29 NOTE — Telephone Encounter (Signed)
Are you aware of this?  Thanks!

## 2019-11-30 DIAGNOSIS — N312 Flaccid neuropathic bladder, not elsewhere classified: Secondary | ICD-10-CM | POA: Diagnosis not present

## 2019-11-30 DIAGNOSIS — C61 Malignant neoplasm of prostate: Secondary | ICD-10-CM | POA: Diagnosis not present

## 2019-11-30 NOTE — Telephone Encounter (Signed)
Accessed patient's chart to inform [name] [name] of what [name] [name] advised. [name] [name] verbalized understanding and requested a sooner appointment for his Echo. I checked the schedule and advised him I did not have anything sooner available. [name] [name] thanked me for checking, but the follow up was not scheduled during this call.

## 2019-11-30 NOTE — Telephone Encounter (Signed)
Called patient, LVM- advised of message below regarding no other appointment other than the ECHO and follow up. Advised patient to call back if other questions.

## 2019-12-03 NOTE — ED Provider Notes (Signed)
[name] DEPT Provider Note   CSN: [id number] Arrival date & time: 11/25/19  1604     History Chief Complaint  Patient presents with  . Hypotension    [name] [name] is a 86 y.o. male.  HPI     86 year old male comes in with chief complaint of low blood pressure. Patient accompanied by his daughter.  Patient has history of hyperlipidemia, CHF.  Patient is feeling fine.  He reports that his daughter checked his blood pressure and it was reading in the 80s, which got her concerned.  Patient had no associated chest pain, shortness of breath, dizziness, near syncope and he does not feel any weakness.  When EMS arrived, the blood pressure was noted to be close to normal.  Patient denies any new medication changes.   Past Medical History:  Diagnosis Date  . Arthritis   . Colitis   . Edema   . Essential hypertension    no med now  . Essential tremor   . Hyperlipidemia   . Hypotonic bladder    L4-L5  . Obstructive uropathy   . Peripheral neuropathy    Elevated by Dr. love 2010  . Prostate cancer [facility]) 2005    Patient Active Problem List   Diagnosis Date Noted  . S/P shoulder replacement, right 06/04/2016  . Venous insufficiency 05/15/2015  . BPH (benign prostatic hyperplasia) 08/15/2014  . CAP (community acquired pneumonia) 08/14/2014  . Colitis 08/14/2014  . Pneumonia 08/13/2014  . OA (osteoarthritis) of knee 04/09/2013  . Edema   . Dyspnea 09/27/2012  . Bilateral leg edema 09/27/2012  . HTN (hypertension) 09/27/2012  . Hyperlipidemia     Past Surgical History:  Procedure Laterality Date  . ACHILLES TENDON SURGERY Bilateral 1995  . COLONOSCOPY     With polyp resection  . REVERSE SHOULDER ARTHROPLASTY Right 06/04/2016  . REVERSE SHOULDER ARTHROPLASTY Right 06/04/2016   Procedure: REVERSE SHOULDER ARTHROPLASTY;  Surgeon: Netta Cedars, MD;  Location: [place];  Service: Orthopedics;  Laterality: Right;  . SKIN CANCER EXCISION  1974   On chest wall  . TOTAL KNEE ARTHROPLASTY Right 2011  . TOTAL KNEE ARTHROPLASTY Left 04/09/2013   Procedure: LEFT TOTAL KNEE ARTHROPLASTY;  Surgeon: Gearlean Alf, MD;  Location[facility];  Service: Orthopedics;  Laterality: Left;       Family History  Problem Relation Age of Onset  . Hypertension Mother   . Heart failure Mother   . Diabetes Mother   . Heart attack Brother   . Heart attack Maternal Aunt     Social History   Tobacco Use  . Smoking status: Never Smoker  . Smokeless tobacco: Never Used  Substance Use Topics  . Alcohol use: Yes    Alcohol/week: 1.0 standard drinks    Types: 1 Glasses of wine per week    Comment: occ  . Drug use: No    Home Medications Prior to Admission medications   Medication Sig Start Date End Date Taking? Authorizing Provider  aspirin 81 MG tablet Take 81 mg by mouth at bedtime.     [provider]  finasteride (PROSCAR) 5 MG tablet  07/27/17   [provider]  folic acid (FOLVITE) 867 MCG tablet Take 800 mcg by mouth daily.     [provider]  furosemide (LASIX) 40 MG tablet Take 40 mg by mouth daily.     [provider]  Multiple Vitamins-Minerals (CENTRUM SILVER PO) Take 1 tablet by mouth daily.  [provider]  sulfaSALAzine (AZULFIDINE) 500 MG tablet Take 2,000 mg by mouth daily.     [provider]  tamsulosin (FLOMAX) 0.4 MG CAPS Take 0.4 mg by mouth 2 (two) times daily.     [provider]    Allergies    Simvastatin  Review of Systems   Review of Systems  Constitutional: Negative for activity change.  Respiratory: Negative for shortness of breath.   Cardiovascular: Negative for chest pain.  Neurological: Negative for dizziness.    Physical Exam Updated Vital Signs BP (!) 121/94   Pulse 84   Temp 97.8 F (36.6 C) (Oral)   Resp 17   SpO2 95%   Physical Exam Vitals and nursing note reviewed.  Constitutional:      Appearance: He is well-developed.   HENT:     Head: Normocephalic and atraumatic.  Eyes:     Conjunctiva/sclera: Conjunctivae normal.     Pupils: Pupils are equal, round, and reactive to light.  Cardiovascular:     Rate and Rhythm: Normal rate and regular rhythm.     Heart sounds: Normal heart sounds.  Pulmonary:     Effort: Pulmonary effort is normal. No respiratory distress.     Breath sounds: Normal breath sounds. No wheezing.  Abdominal:     General: Bowel sounds are normal. There is no distension.     Palpations: Abdomen is soft.     Tenderness: There is no abdominal tenderness. There is no guarding or rebound.  Musculoskeletal:        General: No swelling.     Cervical back: Normal range of motion and neck supple.  Skin:    General: Skin is warm.  Neurological:     Mental Status: He is alert and oriented to person, place, and time.     ED Results / Procedures / Treatments   Labs (all labs ordered are listed, but only abnormal results are displayed) Labs Reviewed  BASIC METABOLIC PANEL - Abnormal; Notable for the following components:      Result Value   Glucose, Bld 124 (*)    Calcium 8.8 (*)    GFR calc non Af Amer 54 (*)    All other components within normal limits  CBC - Abnormal; Notable for the following components:   Platelets 147 (*)    All other components within normal limits    EKG [name]  Radiology No results found.  Procedures Procedures (including critical care time)  Medications Ordered in ED Medications - No data to display  ED Course  I have reviewed the triage vital signs and the nursing notes.  Pertinent labs & imaging results that were available during my care of the patient were reviewed by me and considered in my medical decision making (see chart for details).    MDM Rules/Calculators/A&P                      86 year old male comes to the ER with chief complaint of concerns with low blood pressure.  It appears that there could be a faulty BP machine at home as the  blood pressure readings in the ER have been all within normal range.  Patient is not having any associated symptoms that are concerning for low perfusion.  We will discharge.  Final Clinical Impression(s) / ED Diagnoses Final diagnoses:  Blood pressure check    Rx / DC Orders ED Discharge Orders    [name]  Varney Biles, MD 12/03/19 [phone]

## 2019-12-06 DIAGNOSIS — I509 Heart failure, unspecified: Secondary | ICD-10-CM | POA: Diagnosis not present

## 2019-12-06 DIAGNOSIS — I959 Hypotension, unspecified: Secondary | ICD-10-CM | POA: Diagnosis not present

## 2019-12-07 DIAGNOSIS — N312 Flaccid neuropathic bladder, not elsewhere classified: Secondary | ICD-10-CM | POA: Diagnosis not present

## 2019-12-10 ENCOUNTER — Ambulatory Visit (INDEPENDENT_AMBULATORY_CARE_PROVIDER_SITE_OTHER): Payer: Medicare HMO | Admitting: Podiatrist

## 2019-12-10 ENCOUNTER — Other Ambulatory Visit: Payer: Self-pay

## 2019-12-10 ENCOUNTER — Ambulatory Visit (HOSPITAL_COMMUNITY): Payer: Medicare HMO | Attending: Cardiology

## 2019-12-10 VITALS — Temp 96.9°F

## 2019-12-10 DIAGNOSIS — I119 Hypertensive heart disease without heart failure: Secondary | ICD-10-CM | POA: Diagnosis not present

## 2019-12-10 DIAGNOSIS — I493 Ventricular premature depolarization: Secondary | ICD-10-CM

## 2019-12-10 DIAGNOSIS — M79609 Pain in unspecified limb: Secondary | ICD-10-CM

## 2019-12-10 DIAGNOSIS — R0600 Dyspnea, unspecified: Secondary | ICD-10-CM | POA: Diagnosis not present

## 2019-12-10 DIAGNOSIS — M79676 Pain in unspecified toe(s): Secondary | ICD-10-CM

## 2019-12-10 DIAGNOSIS — I083 Combined rheumatic disorders of mitral, aortic and tricuspid valves: Secondary | ICD-10-CM | POA: Insufficient documentation

## 2019-12-10 DIAGNOSIS — I77810 Thoracic aortic ectasia: Secondary | ICD-10-CM | POA: Diagnosis not present

## 2019-12-10 DIAGNOSIS — B351 Tinea unguium: Secondary | ICD-10-CM

## 2019-12-10 DIAGNOSIS — E785 Hyperlipidemia, unspecified: Secondary | ICD-10-CM | POA: Diagnosis not present

## 2019-12-10 DIAGNOSIS — R609 Edema, unspecified: Secondary | ICD-10-CM | POA: Diagnosis not present

## 2019-12-10 NOTE — Patient Instructions (Signed)

## 2019-12-10 NOTE — Progress Notes (Signed)
   Chief Complaint  Patient presents with  . Nail Problem    Thick, long toenails - bilateral  . Callouses    Pt stated, "I had corns/calluses, but they were bothering me. I took them off myself with OTC products".     HPI: Patient is 86 y.o. male who presents today for the concerns as listed above.     Review of Systems No fevers, chills, nausea, muscle aches, no difficulty breathing, no calf pain, no chest pain or shortness of breath.   Physical Exam  GENERAL APPEARANCE: Alert, conversant. Appropriately groomed. No acute distress.   VASCULAR: Pedal pulses palpable DP and PT bilateral.  Capillary refill time is immediate to all digits,  Proximal to distal cooling it warm to warm.  Bilateral lower extremity swelling is present.  The patient is wearing compression hose.    NEUROLOGIC: sensation is intact epicritically and protectively to 5.07 monofilament at 5/5 sites bilateral.  Light touch is intact bilateral, vibratory sensation intact bilateral, achilles tendon reflex is intact bilateral.   MUSCULOSKELETAL: acceptable muscle strength, tone and stability bilateral.  No gross boney pedal deformities noted.  No pain, crepitus or limitation noted with foot and ankle range of motion bilateral.   DERMATOLOGIC: skin is warm, supple, and dry.  No open lesions noted.  No rash, no pre ulcerative lesions. Digital nails are elongated, thick, discolored and dystrophic x 10.       Assessment     ICD-10-CM   1. Pain due to onychomycosis of nail  B35.1    M79.609     Plan  Thorough debridement via manual and mechanical means was accomplished today without incident.  He will return in 3 months or as needed for follow up.

## 2019-12-11 ENCOUNTER — Institutional Professional Consult (permissible substitution): Payer: Medicare HMO | Admitting: Internal Medicine

## 2019-12-11 ENCOUNTER — Encounter: Payer: Self-pay | Admitting: Podiatrist

## 2019-12-12 DIAGNOSIS — R339 Retention of urine, unspecified: Secondary | ICD-10-CM | POA: Diagnosis not present

## 2019-12-14 ENCOUNTER — Telehealth: Payer: Self-pay | Admitting: Student

## 2019-12-14 NOTE — Telephone Encounter (Signed)
[place] is calling requesting to speak with [facility] in regards to what his appointment in July is in regards to it. Please advise.

## 2019-12-14 NOTE — Telephone Encounter (Signed)
Spoke with patient. He wants to see Dr. Martinique instead of [place]. Patient placed on Dr. Doug Sou schedule and appointment cancelled in July for [facility].

## 2019-12-24 NOTE — Progress Notes (Signed)
Virtual Visit via Video Note   This visit type was conducted due to national recommendations for restrictions regarding the COVID-19 Pandemic (e.g. social distancing) in an effort to limit this patient's exposure and mitigate transmission in our community.  Due to his co-morbid illnesses, this patient is at least at moderate risk for complications without adequate follow up.  This format is felt to be most appropriate for this patient at this time.  All issues noted in this document were discussed and addressed.  A limited physical exam was performed with this format.  Please refer to the patient's chart for his consent to telehealth for [facility].   The patient was identified using 2 identifiers.  Date:  12/26/2019   ID:  [name] [name], DOB 06-26-1928, MRN [id number]  Patient Location: Home Provider Location: Home  PCP:  [name], MD  Cardiologist:  Makaveli Hoard Martinique, MD  Electrophysiologist:  None   Evaluation Performed:  Follow-Up Visit  Chief Complaint:  LE edema/CHF  History of Present Illness:    [name] [name] is a 86 y.o. male seen for follow up CHF. He has a history of venous insufficiency with chronic lower extremity edema (mostly on the left), hypertension, hyperlipidemia, peripheral neuropathy, essential tremor, and prostate cancer.  Patient first seen  in 09/2012 for evaluation of dyspnea on exertion and lower extremity edema, especially in his left leg. Venous dopplers were ordered and showed no DVT. Echo showed LVEF of 55-60% with moderate asymmetric basal septal hypertrophy with no LV outflow tract gradient and mild to moderate MR. He was seen  in 07/2017 at which time he was doing well. He denied any chest pain or shortness of breath. He continued to report some lower extremity edema that resolved with elevation of legs at night. Edema felt to be due to venous insufficiency and conservative management with sodium restriction, elevation of legs, and compression stockings  was recommended.   He was diagnosed with right middle lobe pneumonia on 10/12/2019 after presenting for further evaluation of fatigue while on vacation at the beach.. He tested negative for COVID. He was started on Levaquin but this had to be stopped when he developed tendonitis. He was started on Azithromycin instead on 10/26/2019. Repeat chest x-ray on 10/26/2019 showed increased density of right lower lobe pneumonia. He had persistent fatigue  and developed significant lower extremity edema and weight gain. Patient was seen by PCP again on 11/14/2019. Repeat chest x-ray showed continued right basilar pneumonia and increased right pleural effusion. BNP was checked and came back elevated at 1,380. He was started on Lasix 19m daily. Chest CT was also ordered to rule out underlying lung mass. CT was performed on 11/15/2019 and showed mild cardiomegaly, moderate right pleural effusion with adjacent atelectasis or pneumonia in the right lower and middle lobes as well as coronary artery calcifications. Of note, prior to being started on Lasix, he was seen by Urology for urinary retention and had a catheter placed.  He was taking lasix 80 mg daily but this has been reduced to 40 mg. Foley catheter was removed and he is now in and out cathing himself every other day.  He had an Echo done showing EF 40-45% with severe HK of the entire inferior wall. There was severe basal septal hypertrophy with gr 2 diastolic dysfunction. Moderate MR, mild AS and mild Aortic root enlargement.   He is seen with his wife today. His main complaint is that he is very fatigued. Not sleeping well. Denies any  chest pain. Notes his breathing is OK and he has no cough. Weight is down considerably. Notes edema is much better. Resolves completely at night then he puts compression hose on first thing. No palpitations.  The patient does not have symptoms concerning for COVID-19 infection (fever, chills, cough, or new shortness of breath).     Past Medical History:  Diagnosis Date  . Arthritis   . Colitis   . Edema   . Essential hypertension    no med now  . Essential tremor   . Hyperlipidemia   . Hypotonic bladder    L4-L5  . Obstructive uropathy   . Peripheral neuropathy    Elevated by Dr. love 2010  . Prostate cancer ([place]) 2005   Past Surgical History:  Procedure Laterality Date  . ACHILLES TENDON SURGERY Bilateral 1995  . COLONOSCOPY     With polyp resection  . REVERSE SHOULDER ARTHROPLASTY Right 06/04/2016  . REVERSE SHOULDER ARTHROPLASTY Right 06/04/2016   Procedure: REVERSE SHOULDER ARTHROPLASTY;  Surgeon: Netta Cedars, MD;  Location: [place];  Service: Orthopedics;  Laterality: Right;  . SKIN CANCER EXCISION  1974   On chest wall  . TOTAL KNEE ARTHROPLASTY Right 2011  . TOTAL KNEE ARTHROPLASTY Left 04/09/2013   Procedure: LEFT TOTAL KNEE ARTHROPLASTY;  Surgeon: Gearlean Alf, MD;  Location: [facility];  Service: Orthopedics;  Laterality: Left;     Current Meds  Medication Sig  . aspirin 81 MG tablet Take 81 mg by mouth at bedtime.   . finasteride (PROSCAR) 5 MG tablet   . folic acid (FOLVITE) 759 MCG tablet Take 800 mcg by mouth daily.   . furosemide (LASIX) 40 MG tablet Take 40 mg by mouth daily.   . Multiple Vitamins-Minerals (CENTRUM SILVER PO) Take 1 tablet by mouth daily.   Marland Kitchen sulfaSALAzine (AZULFIDINE) 500 MG tablet Take 2,000 mg by mouth daily.   . tamsulosin (FLOMAX) 0.4 MG CAPS Take 0.4 mg by mouth 2 (two) times daily.      Allergies:   Simvastatin   Social History   Tobacco Use  . Smoking status: Never Smoker  . Smokeless tobacco: Never Used  Substance Use Topics  . Alcohol use: Yes    Alcohol/week: 1.0 standard drink    Types: 1 Glasses of wine per week    Comment: occ  . Drug use: No     Family Hx: The patient's family history includes Diabetes in his mother; Heart attack in his brother and maternal aunt; Heart failure in his mother; Hypertension in his mother.  ROS:   Please  see the history of present illness.    All other systems reviewed and are negative.   Prior CV studies:   The following studies were reviewed today:  Echo 12/10/19: IMPRESSIONS    1. Left ventricular ejection fraction, by estimation, is 40 to 45%. The  left ventricle has mildly decreased function. The left ventricle  demonstrates global hypokinesis. There is severe left ventricular  hypertrophy of the basal-septal segment. Left  ventricular diastolic parameters are consistent with Grade II diastolic  dysfunction (pseudonormalization). There is severe hypokinesis of the left  ventricular, entire inferior wall and inferoseptal wall.  2. Right ventricular systolic function is normal. The right ventricular  size is mildly enlarged. There is normal pulmonary artery systolic  pressure. The estimated right ventricular systolic pressure is 16.3 mmHg.  3. Left atrial size was mild to moderately dilated.  4. The mitral valve is normal in structure. Moderate mitral valve  regurgitation. No evidence of mitral stenosis.  5. The aortic valve is tricuspid. Aortic valve regurgitation is not  visualized. Mild aortic valve stenosis. Aortic valve area, by VTI measures  1.51 cm. Aortic valve mean gradient measures 20.0 mmHg. Aortic valve Vmax  measures 2.46 m/s. The degree of AS  may be underestimated in the setting of LV dysfunction. rree-=--  6. Aortic dilatation noted. There is mild dilatation of the aortic root  and of the ascending aorta measuring 42 mm and 2m respectively.  7. The inferior vena cava is dilated in size with >50% respiratory  variability, suggesting right atrial pressure of 8 mmHg.   Labs/Other Tests and Data Reviewed:    EKG:  An ECG dated 11/19/19 was personally reviewed today and demonstrated:  NSR rate 96. PACs and PVCs. nonspecific TWA  Recent Labs: 11/19/2019: BNP 1,139.7; Magnesium 2.3 11/25/2019: BUN 22; Creatinine, Ser 1.18; Hemoglobin 13.2; Platelets 147;  Potassium 4.1; Sodium 138   Recent Lipid Panel No results found for: CHOL, TRIG, HDL, CHOLHDL, LDLCALC, LDLDIRECT   - 10/12/2019: WBC 7.6, Hgb 14.2, Plts 246. Na 140, K 4.4, Glucose 103, BUN 12, Cr 1.26. AST 29, ALT 21, Alk Phos 61, Total Bili 0.5. TSH 0.849. UA showed 337mof protein but was otherwise unremarkable.  - 10/26/2019: Cr 1.20.  - 11/14/2019: BNP 1,380. Na 140, K 4.6, Glucose 107, BUN 22, Cr 1.16. AST 26, ALT, Alk Phos 59.  Dated 12/06/19: BUN 27, creatinine 1.4. CBC and CMET normal.  Wt Readings from Last 3 Encounters:  12/26/19 174 lb 5 oz (79.1 kg)  11/21/19 187 lb 12.8 oz (85.2 kg)  11/19/19 189 lb (85.7 kg)     Objective:    Vital Signs:  BP 108/79   Pulse 68   Ht 5' 9.5" (1.765 m)   Wt 174 lb 5 oz (79.1 kg)   BMI 25.37 kg/m    VITAL SIGNS:  reviewed  HEENT normal Respirations unlabored.  Color is good.  Neuro: hard of hearing. nonfocal.  ASSESSMENT & PLAN:    1. Acute on chronic combined systolic/diastolic CHF. EF 40-45%.  - CT scan on 11/15/2019 showed mild cardiomegaly with moderate right pleural effusion and adjacent atelectasis or pneumonia in the right lower and middle lobes. Left lung clear.  - BNP elevated at 1,380 at PCP's office on 11/14/2019. Started on Lasix 8052maily with improvement. now on 40 mg daily and weight is down significantly and edema improved per patient report. Denies dyspnea.  - Echo with EF 40-45% with regional wall motion abnormality- inferior wall. - Continue Lasix 40 mg daily for now.  - will  add ARB and beta-blocker if BP will allow. Start losartan 25 mg daily. Will check BMET in a couple of weeks and plan in person office visit in 4 weeks.  - Discussed importance of daily weights and sodium/fluid restriction.   2. Right Lower Lobe Pneumonia/ right pleural effusion - Initially diagnosed in 10/12/2019 but has persistent infiltrates on imaging. asypmtomatic - Has follow up with Pulmonary next week  3. PVCs - Patient having  frequent PVCs. -  potassium and Magnesium OK. - Will hold off on starting beta-blocker for now given soft BP and need for diuresis.   4.  Chronic Venous Insufficiency - Patient has history chronic lower extremity edema (left > right) which has been felt to be due to venous insufficiency in the past. However, looks like he now also has a CHF component. - Continue Lasix as above. - Continue  conservative management with elevation of feet, compression stockings, and sodium restriction.  5. Hypertension - BP soft  Hyperlipidemia - Lipid panel from 07/2019 ([facility]): Total Cholesterol 203, Triglycerides 171, HDL 63, LDL 111.  - Given coronary artery calcification on recent CT scan, would ideally be on statin. Looks like he has been on Simvastatin in the past but did not tolerate this well due to neuropathy. Did not discuss retrying a statin at this visit given acute needs but can discuss at follow-up.  6. Coronary Artery Calcifications - Coronary artery calcification noted on recent CT scan.  - No angina. - Continue Aspirin 7m daily.  - Considering retrying statin as above.   COVID-19 Education: The signs and symptoms of COVID-19 were discussed with the patient and how to seek care for testing (follow up with PCP or arrange E-visit).  The importance of social distancing was discussed today.  Time:   Today, I have spent 20 minutes with the patient with telehealth technology discussing the above problems.     Medication Adjustments/Labs and Tests Ordered: Current medicines are reviewed at length with the patient today.  Concerns regarding medicines are outlined above.   Tests Ordered: No orders of the defined types were placed in this encounter.   Medication Changes: No orders of the defined types were placed in this encounter.   Follow Up:  In Person in 1 month(s)  Signed, Osmany Azer JMartinique MD  12/26/2019 9:21 AM    [place]

## 2019-12-26 ENCOUNTER — Encounter: Payer: Self-pay | Admitting: Cardiology

## 2019-12-26 ENCOUNTER — Ambulatory Visit: Payer: Medicare HMO | Admitting: Cardiology

## 2019-12-26 ENCOUNTER — Telehealth (INDEPENDENT_AMBULATORY_CARE_PROVIDER_SITE_OTHER): Payer: Medicare HMO | Admitting: Cardiology

## 2019-12-26 ENCOUNTER — Telehealth: Payer: Self-pay

## 2019-12-26 VITALS — BP 108/79 | HR 68 | Ht 69.5 in | Wt 174.3 lb

## 2019-12-26 DIAGNOSIS — E785 Hyperlipidemia, unspecified: Secondary | ICD-10-CM

## 2019-12-26 DIAGNOSIS — R5383 Other fatigue: Secondary | ICD-10-CM

## 2019-12-26 DIAGNOSIS — I872 Venous insufficiency (chronic) (peripheral): Secondary | ICD-10-CM | POA: Diagnosis not present

## 2019-12-26 DIAGNOSIS — J189 Pneumonia, unspecified organism: Secondary | ICD-10-CM

## 2019-12-26 DIAGNOSIS — I5043 Acute on chronic combined systolic (congestive) and diastolic (congestive) heart failure: Secondary | ICD-10-CM

## 2019-12-26 DIAGNOSIS — I2510 Atherosclerotic heart disease of native coronary artery without angina pectoris: Secondary | ICD-10-CM | POA: Diagnosis not present

## 2019-12-26 MED ORDER — LOSARTAN POTASSIUM 25 MG PO TABS
25.0000 mg | ORAL_TABLET | Freq: Every day | ORAL | 3 refills | Status: DC
Start: 2019-12-26 — End: 2020-01-22

## 2019-12-26 NOTE — Addendum Note (Signed)
Addended by: Kathyrn Lass on: 12/26/2019 10:08 AM   Modules accepted: Orders

## 2019-12-26 NOTE — Telephone Encounter (Signed)
  Patient Consent for Virtual Visit         [name] [name] has provided verbal consent on 12/26/2019 for a virtual visit (video or telephone).   CONSENT FOR VIRTUAL VISIT FOR:  [name] [name]  By participating in this virtual visit I agree to the following:  I hereby voluntarily request, consent and authorize [facility] and its employed or contracted physicians, physician assistants, nurse practitioners or other licensed health care professionals (the Practitioner), to provide me with telemedicine health care services (the "Services") as deemed necessary by the treating Practitioner. I acknowledge and consent to receive the Services by the Practitioner via telemedicine. I understand that the telemedicine visit will involve communicating with the Practitioner through live audiovisual communication technology and the disclosure of certain medical information by electronic transmission. I acknowledge that I have been given the opportunity to request an in-person assessment or other available alternative prior to the telemedicine visit and am voluntarily participating in the telemedicine visit.  I understand that I have the right to withhold or withdraw my consent to the use of telemedicine in the course of my care at any time, without affecting my right to future care or treatment, and that the Practitioner or I may terminate the telemedicine visit at any time. I understand that I have the right to inspect all information obtained and/or recorded in the course of the telemedicine visit and may receive copies of available information for a reasonable fee.  I understand that some of the potential risks of receiving the Services via telemedicine include:  Marland Kitchen Delay or interruption in medical evaluation due to technological equipment failure or disruption; . Information transmitted may not be sufficient (e.g. poor resolution of images) to allow for appropriate medical decision making by the Practitioner;  and/or  . In rare instances, security protocols could fail, causing a breach of personal health information.  Furthermore, I acknowledge that it is my responsibility to provide information about my medical history, conditions and care that is complete and accurate to the best of my ability. I acknowledge that Practitioner's advice, recommendations, and/or decision may be based on factors not within their control, such as incomplete or inaccurate data provided by me or distortions of diagnostic images or specimens that may result from electronic transmissions. I understand that the practice of medicine is not an exact science and that Practitioner makes no warranties or guarantees regarding treatment outcomes. I acknowledge that a copy of this consent can be made available to me via my patient portal ([facility]), or I can request a printed copy by calling the office of [place].    I understand that my insurance will be billed for this visit.   I have read or had this consent read to me. . I understand the contents of this consent, which adequately explains the benefits and risks of the Services being provided via telemedicine.  . I have been provided ample opportunity to ask questions regarding this consent and the Services and have had my questions answered to my satisfaction. . I give my informed consent for the services to be provided through the use of telemedicine in my medical care

## 2019-12-26 NOTE — Patient Instructions (Signed)
Medication Instructions:  Start Losartan 25 mg daily Continue all other medications *If you need a refill on your cardiac medications before your next appointment, please call your pharmacy*   Lab Work: [facility] in 2 weeks 01/09/20 No lab appointment needed Lab opens 8:00 am to 12:00 noon or 2:00 pm to 4:00 pm.You may eat. Stop at Lab stand at front door.Sign in on clip board.Ring door bell and wait there.Lab Tech will come out to get you.She is the only [facility].You might have to wait 5 to 10 min.    Testing/Procedures: None ordered  Follow-Up: At [facility], you and your health needs are our priority.  As part of our continuing mission to provide you with exceptional heart care, we have created designated Provider Care Teams.  These Care Teams include your primary Cardiologist (physician) and Advanced Practice Providers (APPs -  Physician Assistants and Nurse Practitioners) who all work together to provide you with the care you need, when you need it.  We recommend signing up for the patient portal called "MyChart".  Sign up information is provided on this After Visit Summary.  MyChart is used to connect with patients for Virtual Visits (Telemedicine).  Patients are able to view lab/test results, encounter notes, upcoming appointments, etc.  Non-urgent messages can be sent to your provider as well.   To learn more about what you can do with MyChart, go to [email or web address].      Your next appointment:  Tuesday 01/22/20 at 4:30 pm   The format for your next appointment: Office    Provider:  Dr.Jordan

## 2019-12-28 ENCOUNTER — Telehealth: Payer: Self-pay | Admitting: Cardiology

## 2019-12-28 DIAGNOSIS — I5040 Unspecified combined systolic (congestive) and diastolic (congestive) heart failure: Secondary | ICD-10-CM | POA: Diagnosis not present

## 2019-12-28 DIAGNOSIS — D649 Anemia, unspecified: Secondary | ICD-10-CM | POA: Diagnosis not present

## 2019-12-28 NOTE — Telephone Encounter (Signed)
° ° °  Pt c/o medication issue:  1. Name of Medication:   losartan (COZAAR) 25 MG tablet    2. How are you currently taking this medication (dosage and times per day)?   3. Are you having a reaction (difficulty breathing--STAT)?   4. What is your medication issue? Pt said he saw his pcp Dr. Rex Kras and was told he needs to stop taking losartan

## 2019-12-28 NOTE — Telephone Encounter (Signed)
Called patient- he states that he saw Dr.Little and his BP was 84/62- so he told him to stop the Losartan that Dr.Jordan suggested he be on.   I advised I would notify Dr.Jordan.

## 2019-12-29 NOTE — Telephone Encounter (Signed)
I would have him resume losartan at 12.5 mg every other day and monitor his blood pressure. Call if he is still having hypotension.  Peter Martinique MD, [facility]

## 2020-01-01 NOTE — Telephone Encounter (Signed)
Pt called back returning a call from the office

## 2020-01-01 NOTE — Telephone Encounter (Signed)
Spoke to patient Dr.Jordan's recommendation given.He will take Losartan 12.5 mg every other day.He will monitor B/P and if systolic B/P less than 539 he will not take.

## 2020-01-01 NOTE — Telephone Encounter (Signed)
Called patient no answer.LMTC. 

## 2020-01-02 ENCOUNTER — Ambulatory Visit: Payer: Medicare HMO | Admitting: Pulmonary Disease

## 2020-01-02 ENCOUNTER — Ambulatory Visit (INDEPENDENT_AMBULATORY_CARE_PROVIDER_SITE_OTHER): Payer: Medicare HMO

## 2020-01-02 ENCOUNTER — Encounter: Payer: Self-pay | Admitting: Pulmonary Disease

## 2020-01-02 ENCOUNTER — Encounter: Payer: Self-pay | Admitting: Internal Medicine

## 2020-01-02 ENCOUNTER — Other Ambulatory Visit: Payer: Self-pay

## 2020-01-02 VITALS — BP 104/70 | HR 99 | Temp 97.7°F | Ht 68.25 in | Wt 178.2 lb

## 2020-01-02 DIAGNOSIS — J189 Pneumonia, unspecified organism: Secondary | ICD-10-CM | POA: Diagnosis not present

## 2020-01-02 DIAGNOSIS — R600 Localized edema: Secondary | ICD-10-CM | POA: Diagnosis not present

## 2020-01-02 DIAGNOSIS — R0602 Shortness of breath: Secondary | ICD-10-CM | POA: Diagnosis not present

## 2020-01-02 DIAGNOSIS — J90 Pleural effusion, not elsewhere classified: Secondary | ICD-10-CM

## 2020-01-02 LAB — COMPREHENSIVE METABOLIC PANEL
ALT: 16 U/L (ref 0–53)
AST: 24 U/L (ref 0–37)
Albumin: 4.2 g/dL (ref 3.5–5.2)
Alkaline Phosphatase: 53 U/L (ref 39–117)
BUN: 27 mg/dL — ABNORMAL HIGH (ref 6–23)
CO2: 29 mEq/L (ref 19–32)
Calcium: 9.4 mg/dL (ref 8.4–10.5)
Chloride: 104 mEq/L (ref 96–112)
Creatinine, Ser: 1.35 mg/dL (ref 0.40–1.50)
GFR: 49.67 mL/min — ABNORMAL LOW (ref >60.00)
Glucose, Bld: 144 mg/dL — ABNORMAL HIGH (ref 70–99)
Potassium: 4.4 mEq/L (ref 3.5–5.1)
Sodium: 140 mEq/L (ref 135–145)
Total Bilirubin: 0.6 mg/dL (ref 0.2–1.2)
Total Protein: 7.2 g/dL (ref 6.0–8.3)

## 2020-01-02 LAB — BRAIN NATRIURETIC PEPTIDE: Pro B Natriuretic peptide (BNP): 1167 pg/mL — ABNORMAL HIGH (ref 0.0–100.0)

## 2020-01-02 NOTE — Assessment & Plan Note (Signed)
Patient appears to be clinically stable at this point in time

## 2020-01-02 NOTE — Assessment & Plan Note (Signed)
Plan: Continue diuretics Continue to wear compression stockings Elevate legs when able

## 2020-01-02 NOTE — Progress Notes (Signed)
@Patient  ID: Trustin Chapa, male    DOB: Jul 16, 1927, 86 y.o.   MRN: [id number]  Chief Complaint  Patient presents with  . Follow-up    Follow-up right pleural effusion, 6-week follow-up, stable    Referring provider: [name], MD  HPI:  86 year old male never smoker followed in our office for right-sided pleural effusion  PMH: Hypertension, hyperlipidemia, osteoarthritis, BPH, bilateral leg edema Smoker/ Smoking History: Never smoker Maintenance: None Pt of: Dr. Shearon Stalls  01/02/2020  - Visit   86 year old male never smoker followed in our office for a pleural effusion on right side.  He has been improving with diuretics.  Was last seen in May/2021 by Dr. Shearon Stalls.  He was encouraged to remain on diuretics and to have a repeat chest x-ray today.  Patient reporting that since last being seen he feels that his symptoms are at baseline.  He still has persistent fatigue.  It is suspected that the fatigue may be related to his softer blood pressure readings ever since getting put on diuretics.  Cardiology is following the patient for this.  Patient feels that breathing is stable if not slightly improved.  They are hoping to obtain a chest x-ray today.  Patient is currently adherent to his Lasix 40 mg daily.  Patient with known prostate cancer.  This is being monitored clinically with imaging.  He has never received treatment for it.  Questionaires / Pulmonary Flowsheets:   ACT:  No flowsheet data found.  MMRC: No flowsheet data found.  Epworth:  No flowsheet data found.  Tests:   11/25/2019-BMP-GFR 54  12/06/19 - BNP - 1380  11/15/2019-CT chest with contrast-moderate right pleural effusion is noted with adjacent atelectasis or pneumonia in the right lower and middle lobes, coronary artery calcifications are noted suggesting coronary artery disease  09/19/2019-PET scan-no evidence of pelvic nodular metastasis, no retroperitoneal nodular metastasis, no evidence of visceral  metastasis or skeletal metastasis, tooth foci of intense radiotracer activity the post anterior inferior prostate gland consistent with prostate carcinoma  12/10/2019-echocardiogram-LV ejection fraction 40 to 45%, right ventricle systolic function normal, right ventricular systolic pressure 14.4, grade 2 diastolic dysfunction  FENO:  No results found for: NITRICOXIDE  PFT: No flowsheet data found.  WALK:  No flowsheet data found.  Imaging: DG Chest 2 View  Result Date: 01/02/2020 CLINICAL DATA:  Right-sided pleural effusion EXAM: CHEST - 2 VIEW COMPARISON:  Chest CT 11/15/2019 FINDINGS: Small right pleural effusion which has decreased. Reticulonodular density at the right base that has improved. No edema. Stable cardiomegaly and aortic tortuosity. Scoliosis and right shoulder replacement. IMPRESSION: Improving pneumonia and pleural effusion at the right base. Electronically Signed   By: Monte Fantasia M.D.   On: 01/02/2020 11:49   ECHOCARDIOGRAM COMPLETE  Result Date: 12/10/2019    ECHOCARDIOGRAM REPORT   Patient Name:   ADRON GEISEL  Date of Exam: 12/10/2019 Medical Rec #:  [id number]     Height:       69.5 in Accession #:    [id number]    Weight:       187.8 lb Date of Birth:  1927/12/01     BSA:          2.021 m Patient Age:    52 years      BP:           102/64 mmHg Patient Gender: M             HR:  114 bpm. Exam Location:  [employer] of this table do not appear on this page. Procedure: 2D Echo, Cardiac Doppler and Color Doppler Indications:     R06.00 Dyspnea  History:         Patient has prior history of Echocardiogram examinations, most                  recent 10/02/2012. Risk Factors:Hypertension and Dyslipidemia.                  Edema.  Sonographer:     Cresenciano Lick RDCS Referring Phys:  [id number] Darreld Mclean Diagnosing Phys: Fransico Him MD IMPRESSIONS  1. Left ventricular ejection fraction, by estimation, is 40 to 45%. The left ventricle has mildly  decreased function. The left ventricle demonstrates global hypokinesis. There is severe left ventricular hypertrophy of the basal-septal segment. Left ventricular diastolic parameters are consistent with Grade II diastolic dysfunction (pseudonormalization). There is severe hypokinesis of the left ventricular, entire inferior wall and inferoseptal wall.  2. Right ventricular systolic function is normal. The right ventricular size is mildly enlarged. There is normal pulmonary artery systolic pressure. The estimated right ventricular systolic pressure is 46.2 mmHg.  3. Left atrial size was mild to moderately dilated.  4. The mitral valve is normal in structure. Moderate mitral valve regurgitation. No evidence of mitral stenosis.  5. The aortic valve is tricuspid. Aortic valve regurgitation is not visualized. Mild aortic valve stenosis. Aortic valve area, by VTI measures 1.51 cm. Aortic valve mean gradient measures 20.0 mmHg. Aortic valve Vmax measures 2.46 m/s. The degree of AS  may be underestimated in the setting of LV dysfunction. rree-=--  6. Aortic dilatation noted. There is mild dilatation of the aortic root and of the ascending aorta measuring 42 mm and 24mm respectively.  7. The inferior vena cava is dilated in size with >50% respiratory variability, suggesting right atrial pressure of 8 mmHg. FINDINGS  Left Ventricle: Left ventricular ejection fraction, by estimation, is 40 to 45%. The left ventricle has mildly decreased function. The left ventricle demonstrates global hypokinesis. Severe hypokinesis of the left ventricular, entire inferior wall and inferoseptal wall. The left ventricular internal cavity size was normal in size. There is severe left ventricular hypertrophy of the basal-septal segment. Left ventricular diastolic parameters are consistent with Grade II diastolic dysfunction (pseudonormalization). Normal left ventricular filling pressure. Right Ventricle: The right ventricular size is mildly  enlarged. No increase in right ventricular wall thickness. Right ventricular systolic function is normal. There is normal pulmonary artery systolic pressure. The tricuspid regurgitant velocity is 2.41  m/s, and with an assumed right atrial pressure of 8 mmHg, the estimated right ventricular systolic pressure is 70.3 mmHg. Left Atrium: Left atrial size was mild to moderately dilated. Right Atrium: Right atrial size was normal in size. Pericardium: Trivial pericardial effusion is present. The pericardial effusion is localized near the right atrium. Mitral Valve: The mitral valve is normal in structure. Normal mobility of the mitral valve leaflets. Moderate mitral valve regurgitation. No evidence of mitral valve stenosis. Tricuspid Valve: The tricuspid valve is normal in structure. Tricuspid valve regurgitation is mild . No evidence of tricuspid stenosis. Aortic Valve: The aortic valve is tricuspid. . There is moderate thickening and moderate calcification of the aortic valve. Aortic valve regurgitation is not visualized. Mild aortic stenosis is present. There is moderate thickening of the aortic valve. There is moderate calcification of the aortic valve. Aortic valve mean gradient measures 20.0 mmHg. Aortic valve  peak gradient measures 24.2 mmHg. Aortic valve area, by VTI measures 1.51 cm. Pulmonic Valve: The pulmonic valve was normal in structure. Pulmonic valve regurgitation is trivial. No evidence of pulmonic stenosis. Aorta: Aortic dilatation noted. There is mild dilatation of the aortic root and of the ascending aorta measuring 42 mm. Venous: The inferior vena cava is dilated in size with greater than 50% respiratory variability, suggesting right atrial pressure of 8 mmHg. IAS/Shunts: The interatrial septum appears to be lipomatous. No atrial level shunt detected by color flow Doppler.  LEFT VENTRICLE PLAX 2D LVIDd:         4.30 cm  Diastology LVIDs:         3.40 cm  LV e' lateral:   8.70 cm/s LV PW:          1.35 cm  LV E/e' lateral: 8.0 LV IVS:        1.75 cm  LV e' medial:    4.03 cm/s LVOT diam:     2.40 cm  LV E/e' medial:  17.3 LV SV:         67 LV SV Index:   33 LVOT Area:     4.52 cm  RIGHT VENTRICLE            IVC RV Basal diam:  4.50 cm    IVC diam: 2.40 cm RV S prime:     9.95 cm/s TAPSE (M-mode): 1.8 cm LEFT ATRIUM             Index       RIGHT ATRIUM           Index LA diam:        5.60 cm 2.77 cm/m  RA Area:     21.40 cm LA Vol (A2C):   86.9 ml 42.99 ml/m RA Volume:   67.70 ml  33.49 ml/m LA Vol (A4C):   81.5 ml 40.32 ml/m LA Biplane Vol: 85.1 ml 42.10 ml/m  AORTIC VALVE AV Area (Vmax):    1.38 cm AV Area (Vmean):   2.51 cm AV Area (VTI):     1.51 cm AV Vmax:           246.20 cm/s AV Vmean:          108.000 cm/s AV VTI:            0.445 m AV Peak Grad:      24.2 mmHg AV Mean Grad:      20.0 mmHg LVOT Vmax:         75.00 cm/s LVOT Vmean:        59.900 cm/s LVOT VTI:          0.149 m LVOT/AV VTI ratio: 0.33  AORTA Ao Root diam: 4.30 cm Ao Asc diam:  4.10 cm MITRAL VALVE               TRICUSPID VALVE MV Area (PHT): 3.72 cm    TR Peak grad:   23.2 mmHg MV Decel Time: 204 msec    TR Vmax:        241.00 cm/s MR Peak grad: 98.3 mmHg MR Mean grad: 64.8 mmHg    SHUNTS MR Vmax:      495.75 cm/s  Systemic VTI:  0.15 m MR Vmean:     381.2 cm/s   Systemic Diam: 2.40 cm MV E velocity: 69.80 cm/s MV A velocity: 43.20 cm/s MV E/A ratio:  1.62 Fransico Him MD Electronically signed by Fransico Him MD Signature Date/Time: 12/10/2019/2:18:09  PM    Final (Updated)     Lab Results:  CBC    Component Value Date/Time   WBC 5.7 11/25/2019 1638   RBC 4.32 11/25/2019 1638   HGB 13.2 11/25/2019 1638   HGB 14.5 11/19/2019 1550   HCT 40.5 11/25/2019 1638   HCT 42.9 11/19/2019 1550   PLT 147 (L) 11/25/2019 1638   PLT 156 11/19/2019 1550   MCV 93.8 11/25/2019 1638   MCV 94 11/19/2019 1550   MCH 30.6 11/25/2019 1638   MCHC 32.6 11/25/2019 1638   RDW 14.1 11/25/2019 1638   RDW 12.7 11/19/2019 1550   LYMPHSABS  1.1 08/14/2014 0526   MONOABS 1.0 08/14/2014 0526   EOSABS 0.1 08/14/2014 0526   BASOSABS 0.0 08/14/2014 0526    BMET    Component Value Date/Time   NA 138 11/25/2019 1638   NA 141 11/19/2019 1550   K 4.1 11/25/2019 1638   CL 104 11/25/2019 1638   CO2 24 11/25/2019 1638   GLUCOSE 124 (H) 11/25/2019 1638   BUN 22 11/25/2019 1638   BUN 22 11/19/2019 1550   CREATININE 1.18 11/25/2019 1638   CALCIUM 8.8 (L) 11/25/2019 1638   GFRNONAA 54 (L) 11/25/2019 1638   GFRAA >60 11/25/2019 1638    BNP    Component Value Date/Time   BNP 1,139.7 (H) 11/19/2019 1550    ProBNP    Component Value Date/Time   PROBNP 213.0 (H) 09/27/2012 1528    Specialty Problems      Pulmonary Problems   Dyspnea   Pneumonia   CAP (community acquired pneumonia)   Pleural effusion on right      Allergies  Allergen Reactions  . Simvastatin Other (See Comments)    "caused neuropathy pain"    Immunization History  Administered Date(s) Administered  . Influenza, High Dose Seasonal PF 04/24/2019  . PFIZER SARS-COV-2 Vaccination 07/20/2019, 08/10/2019    Past Medical History:  Diagnosis Date  . Arthritis   . Colitis   . Edema   . Essential hypertension    no med now  . Essential tremor   . Hyperlipidemia   . Hypotonic bladder    L4-L5  . Obstructive uropathy   . Peripheral neuropathy    Elevated by Dr. love 2010  . Prostate cancer ([place]) 2005    Tobacco History: Social History   Tobacco Use  Smoking Status Never Smoker  Smokeless Tobacco Never Used   Counseling given: Yes   Continue to not smoke  Outpatient Encounter Medications as of 01/02/2020  Medication Sig  . aspirin 81 MG tablet Take 81 mg by mouth at bedtime.   . finasteride (PROSCAR) 5 MG tablet   . folic acid (FOLVITE) 466 MCG tablet Take 800 mcg by mouth daily.   . furosemide (LASIX) 40 MG tablet Take 40 mg by mouth daily.   Marland Kitchen losartan (COZAAR) 25 MG tablet Take 1 tablet (25 mg total) by mouth daily.  . Multiple  Vitamins-Minerals (CENTRUM SILVER PO) Take 1 tablet by mouth daily.   Marland Kitchen sulfaSALAzine (AZULFIDINE) 500 MG tablet Take 2,000 mg by mouth daily.   . tamsulosin (FLOMAX) 0.4 MG CAPS Take 0.4 mg by mouth 2 (two) times daily.    No facility-administered encounter medications on file as of 01/02/2020.     Review of Systems  Review of Systems  Constitutional: Positive for fatigue. Negative for activity change, chills, fever and unexpected weight change.  HENT: Negative for postnasal drip, rhinorrhea, sinus pressure, sinus pain and sore  throat.   Eyes: Negative.   Respiratory: Negative for cough, shortness of breath and wheezing.   Cardiovascular: Negative for chest pain and palpitations.  Gastrointestinal: Negative for constipation, diarrhea, nausea and vomiting.  Endocrine: Negative.   Genitourinary: Negative.   Musculoskeletal: Negative.   Skin: Negative.   Neurological: Negative for dizziness and headaches.  Psychiatric/Behavioral: Negative.  Negative for dysphoric mood. The patient is not nervous/anxious.   All other systems reviewed and are negative.    Physical Exam  BP 104/70 (BP Location: Left Arm, Cuff Size: Normal)   Pulse 99   Temp 97.7 F (36.5 C) (Oral)   Ht 5' 8.25" (1.734 m)   Wt 178 lb 3.2 oz (80.8 kg)   SpO2 94%   BMI 26.90 kg/m   Wt Readings from Last 5 Encounters:  01/02/20 178 lb 3.2 oz (80.8 kg)  12/26/19 174 lb 5 oz (79.1 kg)  11/21/19 187 lb 12.8 oz (85.2 kg)  11/19/19 189 lb (85.7 kg)  08/22/17 181 lb 9.6 oz (82.4 kg)    BMI Readings from Last 5 Encounters:  01/02/20 26.90 kg/m  12/26/19 25.37 kg/m  11/21/19 27.34 kg/m  11/19/19 27.51 kg/m  08/22/17 26.43 kg/m     Physical Exam Vitals and nursing note reviewed.  Constitutional:      General: He is not in acute distress.    Appearance: Normal appearance. He is normal weight.  HENT:     Head: Normocephalic and atraumatic.     Right Ear: Hearing and external ear normal.     Left Ear:  Hearing and external ear normal.     Nose: Nose normal. No mucosal edema or rhinorrhea.     Right Turbinates: Not enlarged.     Left Turbinates: Not enlarged.     Mouth/Throat:     Mouth: Mucous membranes are dry.     Pharynx: Oropharynx is clear. No oropharyngeal exudate.  Eyes:     Pupils: Pupils are equal, round, and reactive to light.  Cardiovascular:     Rate and Rhythm: Normal rate and regular rhythm.     Pulses: Normal pulses.     Heart sounds: Normal heart sounds. No murmur heard.   Pulmonary:     Effort: Pulmonary effort is normal.     Breath sounds: Normal breath sounds. No decreased breath sounds, wheezing or rales.  Musculoskeletal:     Cervical back: Normal range of motion.     Right lower leg: Edema (1+ pedal edema with compression stockings applied) present.     Left lower leg: Edema (1+ pedal edema with compression stockings applied) present.  Lymphadenopathy:     Cervical: No cervical adenopathy.  Skin:    General: Skin is warm and dry.     Capillary Refill: Capillary refill takes less than 2 seconds.     Findings: No erythema or rash.  Neurological:     General: No focal deficit present.     Mental Status: He is alert and oriented to person, place, and time.     Motor: No weakness.     Coordination: Coordination normal.     Gait: Gait is intact. Gait normal.  Psychiatric:        Mood and Affect: Mood normal.        Behavior: Behavior normal. Behavior is cooperative.        Thought Content: Thought content normal.        Judgment: Judgment normal.       Assessment & Plan:  Pleural effusion on right Plan: Chest x-ray today Continue diuretics Lab work today Continue follow-up with cardiology  Bilateral leg edema Plan: Continue diuretics Continue to wear compression stockings Elevate legs when able  Dyspnea Patient appears to be clinically stable at this point in time    Return in about 2 months (around 03/04/2020), or if symptoms worsen or  fail to improve, for Follow up with Dr. Shearon Stalls.   Lauraine Rinne, NP 01/02/2020   This appointment required 32 minutes of patient care (this includes precharting, chart review, review of results, face-to-face care, etc.).

## 2020-01-02 NOTE — Assessment & Plan Note (Signed)
Plan: Chest x-ray today Continue diuretics Lab work today Continue follow-up with cardiology

## 2020-01-02 NOTE — Progress Notes (Signed)
Chest x-ray showing improving right pleural effusion.  Continue diuretics as prescribed.  Keep follow-up with cardiology.Wyn Quaker, FNP

## 2020-01-02 NOTE — Patient Instructions (Addendum)
You were seen today by Lauraine Rinne, NP  for:   1. Pleural effusion on right  - DG Chest 2 View; Future - B Nat Peptide; Future - Comp Met (CMET); Future  2. Bilateral leg edema  - B Nat Peptide; Future  3. Shortness of breath  - B Nat Peptide; Future   Continue diuretics as outlined  Keep follow-up with cardiology  We will contact you once we receive the results of your chest x-ray as well as your lab work  We recommend today:  Orders Placed This Encounter  Procedures  . DG Chest 2 View    Standing Status:   Future    Number of Occurrences:   1    Standing Expiration Date:   05/04/2020    Order Specific Question:   Reason for Exam (SYMPTOM  OR DIAGNOSIS REQUIRED)    Answer:   pleural effusion    Order Specific Question:   Preferred imaging location?    Answer:   Internal    Order Specific Question:   Radiology Contrast Protocol - do NOT remove file path    Answer:   [email or web address]  . B Nat Peptide    Standing Status:   Future    Standing Expiration Date:   01/01/2021  . Comp Met (CMET)    Standing Status:   Future    Standing Expiration Date:   01/01/2021   Orders Placed This Encounter  Procedures  . DG Chest 2 View  . B Nat Peptide  . Comp Met (CMET)   No orders of the defined types were placed in this encounter.   Follow Up:    Return in about 2 months (around 03/04/2020), or if symptoms worsen or fail to improve, for Follow up with Dr. Shearon Stalls.   Please do your part to reduce the spread of COVID-19:      Reduce your risk of any infection  and COVID19 by using the similar precautions used for avoiding the common cold or flu:  Marland Kitchen Wash your hands often with soap and warm water for at least 20 seconds.  If soap and water are not readily available, use an alcohol-based hand sanitizer with at least 60% alcohol.  . If coughing or sneezing, cover your mouth and nose by coughing or sneezing into the elbow areas of your  shirt or coat, into a tissue or into your sleeve (not your hands). Langley Gauss A MASK when in public  . Avoid shaking hands with others and consider head nods or verbal greetings only. . Avoid touching your eyes, nose, or mouth with unwashed hands.  . Avoid close contact with people who are sick. . Avoid places or events with large numbers of people in one location, like concerts or sporting events. . If you have some symptoms but not all symptoms, continue to monitor at home and seek medical attention if your symptoms worsen. . If you are having a medical emergency, call 911.   [place] / e-Visit: [email or web address]         [facility]: [facility]: [phone]                   [facility]: [phone]     It is flu season:   >>> Best ways to protect herself from the flu: Receive the yearly flu vaccine, practice good hand hygiene washing with soap and also using hand  sanitizer when available, eat a nutritious meals, get adequate rest, hydrate appropriately   Please contact the office if your symptoms worsen or you have concerns that you are not improving.   Thank you for choosing [facility] for your healthcare, and for allowing [place] to partner with you on your healthcare journey. I am thankful to be able to provide care to you today.   Wyn Quaker FNP-C

## 2020-01-04 ENCOUNTER — Telehealth: Payer: Self-pay | Admitting: Student

## 2020-01-04 ENCOUNTER — Ambulatory Visit: Payer: Medicare HMO | Admitting: Student

## 2020-01-04 ENCOUNTER — Telehealth: Payer: Self-pay | Admitting: Internal Medicine

## 2020-01-04 NOTE — Telephone Encounter (Signed)
Spoke to pt's wife who report pt had recent lab work at [facility] and was advised to contact cardiologist today to see if an adjustment to his lasix should be made due to increase in kidney function. Lab results reviewed by Dr. Marisue Ivan (DOD) who recommend to continue current lasix dose.  Attempted to contact pt. Left a detailed message advising of recommendations and instructed wife to call Monday if she had any questions.

## 2020-01-04 NOTE — Telephone Encounter (Signed)
Lauraine Rinne, NP  01/02/2020 10:50 PM EDT     Please let the patient know that his kidney functioning has slightly reduced. He needs to contact cardiology regarding his lab results and see if they want to make any changes to his diuretics or blood pressure medications.   His BNP continues to remain elevated. It has slightly decreased from when PCP did this test a month ago.   No new recs at this time. I will also route the results to Dr. Martinique his cardiologist.   Wyn Quaker FNP    Attempted to call pt but unable to reach. Left message for him to return call.

## 2020-01-04 NOTE — Telephone Encounter (Signed)
Patient calling back stating he needs a call back today before the weekend.

## 2020-01-04 NOTE — Telephone Encounter (Signed)
Left message to call back  

## 2020-01-04 NOTE — Telephone Encounter (Signed)
    Pt would like to speak with [name] [name] or her nurse about recent lab work he got from PCP

## 2020-01-08 DIAGNOSIS — R338 Other retention of urine: Secondary | ICD-10-CM | POA: Diagnosis not present

## 2020-01-08 DIAGNOSIS — C61 Malignant neoplasm of prostate: Secondary | ICD-10-CM | POA: Diagnosis not present

## 2020-01-08 DIAGNOSIS — N319 Neuromuscular dysfunction of bladder, unspecified: Secondary | ICD-10-CM | POA: Diagnosis not present

## 2020-01-08 DIAGNOSIS — N312 Flaccid neuropathic bladder, not elsewhere classified: Secondary | ICD-10-CM | POA: Diagnosis not present

## 2020-01-08 NOTE — Telephone Encounter (Signed)
LMTCB x2 for pt 

## 2020-01-09 ENCOUNTER — Telehealth: Payer: Self-pay | Admitting: Student

## 2020-01-09 NOTE — Telephone Encounter (Signed)
LMTCB x3 for pt. We have attempted to contact pt several times with no success or call back from pt. Per triage protocol, message will be closed.   

## 2020-01-09 NOTE — Telephone Encounter (Signed)
New Message   Patient is calling because he recently had a blood test done at another office. He is just wanting to be sure that the medications he has been prescribed is the best medication. Please call to discuss.

## 2020-01-09 NOTE — Telephone Encounter (Signed)
Patient is calling in because his pulmonologist did lab work recently and suggested that he reach out to his cardiologist so that can evaluate his BP meds/ diuretic doses to see if any changes need to be made.See note below from [facility], NP.   Patient sates he has only been taking 20 mg lasix daily & 12.5 mg Losartan daily. (half the amount of each drug that is prescribed)   Pt denies any SOB, swelling or discomfort.       Lauraine Rinne, NP  01/02/2020 10:50 PM EDT     Please let the patient know that his kidney functioning has slightly reduced. He needs to contact cardiology regarding his lab results and see if they want to make any changes to his diuretics or blood pressure medications.   His BNP continues to remain elevated. It has slightly decreased from when PCP did this test a month ago.   No new recs at this time. I will also route the results to Dr. Martinique his cardiologist.

## 2020-01-10 NOTE — Telephone Encounter (Signed)
He should be on losartan 12.5 mg every other day and lasix 40 mg daily. He should continue this. I reviewed his labs.  Linday Rhodes Martinique MD, [facility]

## 2020-01-15 NOTE — Telephone Encounter (Signed)
Called patient no answer.LMTC. 

## 2020-01-18 NOTE — Telephone Encounter (Signed)
Called patient no answer.LMTC. 

## 2020-01-19 NOTE — Progress Notes (Signed)
Cardiology Office Note   Date:  01/22/2020   ID:  [name] [name], DOB 11-Nov-1927, MRN [id number]  PCP:  [name], MD  Cardiologist:   Salmaan Patchin Martinique, MD   Chief Complaint  Patient presents with  . Leg Swelling      History of Present Illness: [name] [name] is a 86 y.o. male who is seen for follow up CHF. He has a history of venous insufficiency with chronic lower extremity edema (mostly on the left), hypertension, hyperlipidemia, peripheral neuropathy, essential tremor, and prostate cancer.  Patient first seen  in 09/2012 for evaluation of dyspnea on exertion and lower extremity edema, especially in his left leg. Venous dopplers were ordered and showed no DVT. Echo showed LVEF of 55-60% with moderate asymmetric basal septal hypertrophy with no LV outflow tract gradient and mild to moderate MR. He was seen  in 07/2017 at which time he was doing well. He denied any chest pain or shortness of breath. He continued to report some lower extremity edema that resolved with elevation of legs at night. Edema felt to be due to venous insufficiency and conservative management with sodium restriction, elevation of legs, and compression stockings was recommended.   He was diagnosed with right middle lobe pneumonia on 10/12/2019 after presenting for further evaluation of fatigue while on vacation at the beach.. He tested negative for COVID. He was started on Levaquin but this had to be stopped when he developed tendonitis. He was started on Azithromycin instead on 10/26/2019. Repeat chest x-ray on 10/26/2019 showed increased density of right lower lobe pneumonia. He had persistent fatigue  and developed significant lower extremity edema and weight gain. Patient was seen by PCP again on 11/14/2019. Repeat chest x-ray showed continued right basilar pneumonia and increased right pleural effusion. BNP was checked and came back elevated at 1,380. He was started on Lasix 3m daily. Chest CT was also ordered to rule  out underlying lung mass. CT was performed on5/20/2021andshowed mild cardiomegaly, moderate right pleural effusion with adjacent atelectasis or pneumonia in the right lower and middle lobes as well as coronary artery calcifications.Of note, prior to being started on Lasix, he was seen by Urology for urinary retention and had a catheter placed.  He was taking lasix 80 mg daily but this has been reduced to 40 mg. Foley catheter was removed and he is now in and out cathing himself every other day.  He had an Echo done showing EF 40-45% with severe HK of the entire inferior wall. There was severe basal septal hypertrophy with gr 2 diastolic dysfunction. Moderate MR, mild AS and mild Aortic root enlargement. We started him on losartan 25 mg daily but dose had to be reduced to 12.5 mg due to hypotension.   He is seen with his wife today. His edema is markedly improved. He is wearing zip up compression hose. He has reduced his lasix to 20 mg daily. His BP is running low. He is  fatigued.  Denies any chest pain. Notes his breathing is OK and he has no cough. Weight is stable.   Past Medical History:  Diagnosis Date  . Arthritis   . Colitis   . Edema   . Essential hypertension    no med now  . Essential tremor   . Hyperlipidemia   . Hypotonic bladder    L4-L5  . Obstructive uropathy   . Peripheral neuropathy    Elevated by Dr. love 2010  . [place]  Past Surgical History:  Procedure Laterality Date  . ACHILLES TENDON SURGERY Bilateral 1995  . COLONOSCOPY     With polyp resection  . REVERSE SHOULDER ARTHROPLASTY Right 06/04/2016  . REVERSE SHOULDER ARTHROPLASTY Right 06/04/2016   Procedure: REVERSE SHOULDER ARTHROPLASTY;  Surgeon: Netta Cedars, MD;  Location: [facility];  Service: Orthopedics;  Laterality: Right;  . SKIN CANCER EXCISION  1974   On chest wall  . TOTAL KNEE ARTHROPLASTY Right 2011  . TOTAL KNEE ARTHROPLASTY Left 04/09/2013   Procedure: LEFT TOTAL KNEE  ARTHROPLASTY;  Surgeon: Gearlean Alf, MD;  Location: [facility];  Service: Orthopedics;  Laterality: Left;     Current Outpatient Medications  Medication Sig Dispense Refill  . aspirin 81 MG tablet Take 81 mg by mouth at bedtime.     . finasteride (PROSCAR) 5 MG tablet     . folic acid (FOLVITE) 623 MCG tablet Take 800 mcg by mouth daily.     . furosemide (LASIX) 40 MG tablet Take 20 mg by mouth daily. Taking half a tablet daily.    . Multiple Vitamins-Minerals (CENTRUM SILVER PO) Take 1 tablet by mouth daily.     Marland Kitchen sulfaSALAzine (AZULFIDINE) 500 MG tablet Take 2,000 mg by mouth daily.     . tamsulosin (FLOMAX) 0.4 MG CAPS Take 0.4 mg by mouth 2 (two) times daily.      No current facility-administered medications for this visit.    Allergies:   Simvastatin    Social History:  The patient  reports that he has never smoked. He has never used smokeless tobacco. He reports current alcohol use of about 1.0 standard drink of alcohol per week. He reports that he does not use drugs.   Family History:  The patient's family history includes Diabetes in his mother; Heart attack in his brother and maternal aunt; Heart failure in his mother; Hypertension in his mother.    ROS:  Please see the history of present illness.   Otherwise, review of systems are positive for none.   All other systems are reviewed and negative.    PHYSICAL EXAM: VS:  BP 90/68   Pulse 78   Ht 5' 9"  (1.753 m)   Wt 178 lb (80.7 kg)   SpO2 94%   BMI 26.29 kg/m  , BMI Body mass index is 26.29 kg/m. GEN: Well nourished, well developed, in no acute distress  HEENT: normal  Neck: no JVD, carotid bruits, or masses Cardiac: RRR; no murmurs, rubs, or gallops,no edema  Respiratory:  clear to auscultation bilaterally, normal work of breathing GI: soft, nontender, nondistended, + BS MS: no deformity or atrophy  Skin: warm and dry, no rash Neuro:  Strength and sensation are intact Psych: euthymic mood, full affect   EKG:   EKG is not ordered today. The ekg ordered today demonstrates N/A   Recent Labs: 11/19/2019: BNP 1,139.7; Magnesium 2.3 11/25/2019: Hemoglobin 13.2; Platelets 147 01/02/2020: ALT 16; BUN 27; Creatinine, Ser 1.35; Potassium 4.4; Pro B Natriuretic peptide (BNP) 1,167.0; Sodium 140    Lipid Panel No results found for: CHOL, TRIG, HDL, CHOLHDL, VLDL, LDLCALC, LDLDIRECT    Wt Readings from Last 3 Encounters:  01/22/20 178 lb (80.7 kg)  01/02/20 178 lb 3.2 oz (80.8 kg)  12/26/19 174 lb 5 oz (79.1 kg)    - 10/12/2019: WBC 7.6, Hgb 14.2, Plts 246. Na 140, K 4.4, Glucose 103, BUN 12, Cr 1.26. AST 29, ALT 21, Alk Phos 61, Total Bili 0.5. TSH 0.849. UA showed  23m of protein but was otherwise unremarkable.  - 10/26/2019: Cr 1.20.  - 11/14/2019: BNP 1,380. Na 140, K 4.6, Glucose 107, BUN 22, Cr 1.16. AST 26, ALT, Alk Phos 59. Dated 12/06/19: BUN 27, creatinine 1.4. CBC and CMET normal.  Other studies Reviewed: Additional studies/ records that were reviewed today include:  Echo 12/10/19: IMPRESSIONS    1. Left ventricular ejection fraction, by estimation, is 40 to 45%. The  left ventricle has mildly decreased function. The left ventricle  demonstrates global hypokinesis. There is severe left ventricular  hypertrophy of the basal-septal segment. Left  ventricular diastolic parameters are consistent with Grade II diastolic  dysfunction (pseudonormalization). There is severe hypokinesis of the left  ventricular, entire inferior wall and inferoseptal wall.  2. Right ventricular systolic function is normal. The right ventricular  size is mildly enlarged. There is normal pulmonary artery systolic  pressure. The estimated right ventricular systolic pressure is 323.5mmHg.  3. Left atrial size was mild to moderately dilated.  4. The mitral valve is normal in structure. Moderate mitral valve  regurgitation. No evidence of mitral stenosis.  5. The aortic valve is tricuspid. Aortic valve regurgitation  is not  visualized. Mild aortic valve stenosis. Aortic valve area, by VTI measures  1.51 cm. Aortic valve mean gradient measures 20.0 mmHg. Aortic valve Vmax  measures 2.46 m/s. The degree of AS  may be underestimated in the setting of LV dysfunction. rree-=--  6. Aortic dilatation noted. There is mild dilatation of the aortic root  and of the ascending aorta measuring 42 mm and 455mrespectively.  7. The inferior vena cava is dilated in size with >50% respiratory  variability, suggesting right atrial pressure of 8 mmHg.     ASSESSMENT AND PLAN:  1. Acute on chronic combined systolic/diastolic CHF. EF 40-45%.  - CT scan on 11/15/2019 showed mild cardiomegaly with moderate right pleural effusion and adjacent atelectasis or pneumonia in the right lower and middle lobes. Left lung clear.  -BNP elevated at 1,380 at PCP's office on 11/14/2019. Started on Lasix 8050maily with improvement.now on 20 mg daily and weight is down significantly and edema has resolved.  Denies dyspnea.  - Echo with EF 40-45% with regional wall motion abnormality- inferior wall. - Continue Lasix 20 mg daily for now. May try taking every other day as long as weight stable and edema gone. - will discontinue losartan due to hypotension.  - continue compression hose.    2. Right Lower Lobe Pneumonia/ right pleural effusion - Initially diagnosed in 10/12/2019 but has persistent infiltrates on imaging. asypmtomatic   3. PVCs - Patient having frequent PVCs. -  potassium and Magnesium OK. - Will hold off on starting beta-blocker for now given soft BP and need for diuresis.  4.  Chronic Venous Insufficiency - Patient hashistorychronic lower extremity edema (left > right) which has been felt to be due to venous insufficiencyin the past. - Continue Lasix as above. - Continue conservative management with elevation of feet, compression stockings, and sodium restriction.  5. Hypertension - BP low. Stop  losartan  Hyperlipidemia - [facility], Triglycerides 171, HDL 63, LDL 111.  - Given coronary artery calcification on recent CT scan, would ideally be on statin. Looks like he has been on Simvastatin in the past but did not tolerate this well due to neuropathy.   6. Coronary Artery Calcifications - Coronary artery calcification noted on recent CT scan.  - No angina. -  Continue Aspirin 40m daily.     Current medicines are reviewed at length with the patient today.  The patient does not have concerns regarding medicines.  The following changes have been made:  See above  Labs/ tests ordered today include:  No orders of the defined types were placed in this encounter.    Disposition:   FU with me in 6 months  Signed, Brentney Goldbach JMartinique MD  01/22/2020 5:06 PM    [facility] [address] [place] [id number] [phone] Fax [phone]

## 2020-01-21 NOTE — Telephone Encounter (Signed)
This is nonfasting lab work.  A glucose level of 144 is not a significant concern.  It has been elevated in the past likely another episodes of nonfasting lab work.  If the patient is concerned about glucose readings as well as further evaluation can always present to primary care and discussed this with Dr. Rex Kras.  I would be more concerned about glucose readings based off of a fasting glucose results or a A1c.  Discuss this with primary care as we will not be managing this as we are a pulmonary office.  Wyn Quaker, FNP

## 2020-01-21 NOTE — Telephone Encounter (Signed)
Aaron Edelman please advise  Do I have to worry about a High Glucose reading!

## 2020-01-22 ENCOUNTER — Ambulatory Visit: Payer: Medicare HMO | Admitting: Cardiology

## 2020-01-22 ENCOUNTER — Encounter: Payer: Self-pay | Admitting: Cardiology

## 2020-01-22 ENCOUNTER — Other Ambulatory Visit: Payer: Self-pay

## 2020-01-22 VITALS — BP 90/68 | HR 78 | Ht 69.0 in | Wt 178.0 lb

## 2020-01-22 DIAGNOSIS — C44519 Basal cell carcinoma of skin of other part of trunk: Secondary | ICD-10-CM | POA: Diagnosis not present

## 2020-01-22 DIAGNOSIS — D485 Neoplasm of uncertain behavior of skin: Secondary | ICD-10-CM | POA: Diagnosis not present

## 2020-01-22 DIAGNOSIS — L814 Other melanin hyperpigmentation: Secondary | ICD-10-CM | POA: Diagnosis not present

## 2020-01-22 DIAGNOSIS — I5042 Chronic combined systolic (congestive) and diastolic (congestive) heart failure: Secondary | ICD-10-CM | POA: Diagnosis not present

## 2020-01-22 DIAGNOSIS — I872 Venous insufficiency (chronic) (peripheral): Secondary | ICD-10-CM

## 2020-01-22 DIAGNOSIS — D225 Melanocytic nevi of trunk: Secondary | ICD-10-CM | POA: Diagnosis not present

## 2020-01-22 DIAGNOSIS — L570 Actinic keratosis: Secondary | ICD-10-CM | POA: Diagnosis not present

## 2020-01-22 DIAGNOSIS — L578 Other skin changes due to chronic exposure to nonionizing radiation: Secondary | ICD-10-CM | POA: Diagnosis not present

## 2020-01-22 DIAGNOSIS — I2510 Atherosclerotic heart disease of native coronary artery without angina pectoris: Secondary | ICD-10-CM | POA: Diagnosis not present

## 2020-01-22 DIAGNOSIS — L821 Other seborrheic keratosis: Secondary | ICD-10-CM | POA: Diagnosis not present

## 2020-01-22 DIAGNOSIS — Z85828 Personal history of other malignant neoplasm of skin: Secondary | ICD-10-CM | POA: Diagnosis not present

## 2020-01-23 NOTE — Telephone Encounter (Signed)
Patient had a office visit with Dr.Jordan 01/22/20.

## 2020-01-28 DIAGNOSIS — N312 Flaccid neuropathic bladder, not elsewhere classified: Secondary | ICD-10-CM | POA: Diagnosis not present

## 2020-01-28 DIAGNOSIS — R319 Hematuria, unspecified: Secondary | ICD-10-CM | POA: Diagnosis not present

## 2020-01-31 DIAGNOSIS — N312 Flaccid neuropathic bladder, not elsewhere classified: Secondary | ICD-10-CM | POA: Diagnosis not present

## 2020-01-31 DIAGNOSIS — R338 Other retention of urine: Secondary | ICD-10-CM | POA: Diagnosis not present

## 2020-01-31 DIAGNOSIS — C61 Malignant neoplasm of prostate: Secondary | ICD-10-CM | POA: Diagnosis not present

## 2020-01-31 DIAGNOSIS — R972 Elevated prostate specific antigen [PSA]: Secondary | ICD-10-CM | POA: Diagnosis not present

## 2020-02-05 DIAGNOSIS — N312 Flaccid neuropathic bladder, not elsewhere classified: Secondary | ICD-10-CM | POA: Diagnosis not present

## 2020-02-05 DIAGNOSIS — R310 Gross hematuria: Secondary | ICD-10-CM | POA: Diagnosis not present

## 2020-02-27 DIAGNOSIS — C44519 Basal cell carcinoma of skin of other part of trunk: Secondary | ICD-10-CM | POA: Diagnosis not present

## 2020-03-10 ENCOUNTER — Other Ambulatory Visit: Payer: Self-pay

## 2020-03-10 ENCOUNTER — Ambulatory Visit: Payer: Medicare HMO | Admitting: Internal Medicine

## 2020-03-10 ENCOUNTER — Encounter: Payer: Self-pay | Admitting: Internal Medicine

## 2020-03-10 VITALS — BP 102/70 | HR 60 | Temp 97.1°F | Ht 69.0 in | Wt 182.4 lb

## 2020-03-10 DIAGNOSIS — I5020 Unspecified systolic (congestive) heart failure: Secondary | ICD-10-CM

## 2020-03-10 DIAGNOSIS — J90 Pleural effusion, not elsewhere classified: Secondary | ICD-10-CM

## 2020-03-10 NOTE — Progress Notes (Signed)
[name] [name]    [id number]    Jun 30, 1927  Primary Care Physician:Little, Lennette Bihari, MD Date of Appointment: 03/10/2020 Established Patient Visit  Chief complaint:   Chief Complaint  Patient presents with  . Follow-up    Patient feels like his breathing is worse since last visit. States he has his good days and bad days. Today is a good day. Denies cough Shortness of breath with exertion and has no energy   HPI: [name] [name] is a 86 y.o. gentleman with heart failure reduced ejection fraction  Interval Updates: No hospitalizations or ED visits. No recurrence of fluid on his lung. Had to stop BB and ACE-I due to hypotension. Only on diuretic now. He is traveling to [place] with wife to see family soon. Both have been vaccinated. Asking about oxygen saturation on plane.   I have reviewed the patient's family social and past medical history and updated as appropriate.   Past Medical History:  Diagnosis Date  . Arthritis   . Colitis   . Edema   . Essential hypertension    no med now  . Essential tremor   . Hyperlipidemia   . Hypotonic bladder    L4-L5  . Obstructive uropathy   . Peripheral neuropathy    Elevated by Dr. love 2010  . Prostate cancer ([place]) 2005    Past Surgical History:  Procedure Laterality Date  . ACHILLES TENDON SURGERY Bilateral 1995  . COLONOSCOPY     With polyp resection  . REVERSE SHOULDER ARTHROPLASTY Right 06/04/2016  . REVERSE SHOULDER ARTHROPLASTY Right 06/04/2016   Procedure: REVERSE SHOULDER ARTHROPLASTY;  Surgeon: Netta Cedars, MD;  Location: [facility];  Service: Orthopedics;  Laterality: Right;  . SKIN CANCER EXCISION  1974   On chest wall  . TOTAL KNEE ARTHROPLASTY Right 2011  . TOTAL KNEE ARTHROPLASTY Left 04/09/2013   Procedure: LEFT TOTAL KNEE ARTHROPLASTY;  Surgeon: Gearlean Alf, MD;  Location: [facility];  Service: Orthopedics;  Laterality: Left;    Family History  Problem Relation Age of Onset  . Hypertension Mother   . Heart  failure Mother   . Diabetes Mother   . Heart attack Brother   . Heart attack Maternal Aunt     Social History   Occupational History  . Not on file  Tobacco Use  . Smoking status: Never Smoker  . Smokeless tobacco: Never Used  Vaping Use  . Vaping Use: Never used  Substance and Sexual Activity  . Alcohol use: Yes    Alcohol/week: 1.0 standard drink    Types: 1 Glasses of wine per week    Comment: occ  . Drug use: No  . Sexual activity: Yes     Physical Exam: Blood pressure 102/70, pulse 60, temperature (!) 97.1 F (36.2 C), temperature source Temporal, height 5\' 9"  (1.753 m), weight 182 lb 6.4 oz (82.7 kg), SpO2 94 %.  Gen:      No acute distress Lungs:    No increased respiratory effort, symmetric chest wall excursion, clear to auscultation bilaterally, no wheezes or crackles CV:         Regular rate and rhythm; no murmurs, rubs, or gallops. Mild pedal edema. In compression socks bilaterally.    Data Reviewed: Imaging: I have personally reviewed the chest xray from July 2021 which shows improved right pleural effusion  Labs:  Immunization status: Immunization History  Administered Date(s) Administered  . Influenza, High Dose Seasonal PF 04/24/2019  . PFIZER  SARS-COV-2 Vaccination 07/20/2019, 08/10/2019    Assessment:  Heart Failure reduced ejection fraction EF 40-45% Pleural Effusion  Plan/Recommendations: No recurrence of effusion. He is doing well for his age, all things considered.  94% on room air today. Should be ok to travel without oxygen. Asked them to keep an eye on O2 saturation, goal is to stay consistently over 88%.  Continue heart failure therapy with lasix.  Gentle exercise with bike/walking.   Return to Care: Return if symptoms worsen or fail to improve.   Lenice Llamas, MD Pulmonary and [place]

## 2020-03-10 NOTE — Patient Instructions (Signed)
Come see me if you have additional difficulty with your breathing.

## 2020-03-16 NOTE — Progress Notes (Signed)
Cardiology Office Note:    Date:  03/17/2020   ID:  [name] [name], DOB [birth year], MRN [id number]  PCP:  [name], MD  Cardiologist:  Peter Martinique, MD   Referring MD: [name], MD   Chief Complaint  Patient presents with  . Follow-up    "I want clearance to exercise"    History of Present Illness:    [name] [name] is a 86 y.o. male with a hx of HTN, venous insufficiency, HLD, OA, and chronic bilateral leg edema, peripheral neuropathy, essential tremor, and prostate cancer.  Echo in 2014 with normla EF, moderate asymmetric basal septal hypertrophy and no LV outflow tract gradient, mild to moderate MR. He has chronic leg edema due to venous insufficiency. He was diagnosed with RML PNA 10/12/19, COVID negative. He did not tolerate Levaquin due to tendonitis, but completed azithromycin. Repeat CXR with increased density of RLL PNA, CXR 11/14/19 with continued basilar PNA and increased right pleural effusion. BNP was elevated to 1380. He was started on 90 mg lasix and CT chest showed mild cardiomegaly, moderate right pleural effusion with adjacent atelectasis vs PNA. He follows with pulmonology. Prior to lasix, he saw urology for urinary retention and had catheter placed. Foley has since been removed and he self in and out caths. Echo 12/10/19 with EF of 40-45%, severe LVH, grade 2 DD, wall motion abnormality in the entire inferior wall and inferoseptal wall, moderate MR, and AS, aortic root and AAA of 42 mm. In discussion with Dr. Martinique, [name] treated medically as long as he is asymptomatic, possibly repeat echo in 3 months.   Soft BP have limited titration of heart failure medications. BB and ACEI previously stopped for hypotension. He is now taking losartan 12.5 mg every other day and 40 mg lasix daily. He was last seen by Dr. Martinique 12/26/19. CHF medications were limited by hypotension. He presents today for follow up and for permission to exercise.   He continues to have significant fatigue  and low stamina. His main complaint is sleep disturbance. He already takes melatonin 5 mg. He wants to proceed with repeat echo. He is not taking losartan. He is taking 40 mg lasix every other day. No dizziness on days he takes lasix. No chest pain or orthopnea. He continues to have mild SOB at rest. Lower extremities look great today, compression socks in place. He is interested in starting an exercise program with a trainer at [employer] that specializes in geriatric patients. I took him on a 1 minute walk around the clinic. O2 above 88%, but he was easily fatigued after 1 minute.     Past Medical History:  Diagnosis Date  . Arthritis   . Colitis   . Edema   . Essential hypertension    no med now  . Essential tremor   . Hyperlipidemia   . Hypotonic bladder    L4-L5  . Obstructive uropathy   . Peripheral neuropathy    Elevated by Dr. love 2010  . Prostate cancer ([name]) 2005    Past Surgical History:  Procedure Laterality Date  . ACHILLES TENDON SURGERY Bilateral 1995  . COLONOSCOPY     With polyp resection  . REVERSE SHOULDER ARTHROPLASTY Right 06/04/2016  . REVERSE SHOULDER ARTHROPLASTY Right 06/04/2016   Procedure: REVERSE SHOULDER ARTHROPLASTY;  Surgeon: Netta Cedars, MD;  Location: [name];  Service: Orthopedics;  Laterality: Right;  . SKIN CANCER EXCISION  1974   On chest wall  . TOTAL KNEE ARTHROPLASTY Right 2011  .  TOTAL KNEE ARTHROPLASTY Left 04/09/2013   Procedure: LEFT TOTAL KNEE ARTHROPLASTY;  Surgeon: Gearlean Alf, MD;  Location: [facility];  Service: Orthopedics;  Laterality: Left;    Current Medications: Current Meds  Medication Sig  . aspirin 81 MG tablet Take 81 mg by mouth at bedtime.   . dutasteride (AVODART) 0.5 MG capsule Take 0.5 mg by mouth daily.  . folic acid (FOLVITE) 382 MCG tablet Take 800 mcg by mouth daily.   . furosemide (LASIX) 40 MG tablet Take 40 mg by mouth every other day.  . melatonin 5 MG TABS Take 5 mg by mouth at bedtime as needed.  .  Multiple Vitamins-Minerals (CENTRUM SILVER PO) Take 1 tablet by mouth daily.   Marland Kitchen sulfaSALAzine (AZULFIDINE) 500 MG tablet Take 2,000 mg by mouth daily.   . tamsulosin (FLOMAX) 0.4 MG CAPS Take 0.4 mg by mouth 2 (two) times daily.   . [DISCONTINUED] finasteride (PROSCAR) 5 MG tablet   . [DISCONTINUED] furosemide (LASIX) 40 MG tablet Take 20 mg by mouth daily. Taking half a tablet daily.     Allergies:   Simvastatin   Social History   Socioeconomic History  . Marital status: Married    Spouse name: Not on file  . Number of children: Not on file  . Years of education: Not on file  . Highest education level: Not on file  Occupational History  . Not on file  Tobacco Use  . Smoking status: Never Smoker  . Smokeless tobacco: Never Used  Vaping Use  . Vaping Use: Never used  Substance and Sexual Activity  . Alcohol use: Yes    Alcohol/week: 1.0 standard drink    Types: 1 Glasses of wine per week    Comment: occ  . Drug use: No  . Sexual activity: Yes  Other Topics Concern  . Not on file  Social History Narrative  . Not on file   Social Determinants of Health   Financial Resource Strain:   . Difficulty of Paying Living Expenses: Not on file  Food Insecurity:   . Worried About Charity fundraiser in the Last Year: Not on file  . Ran Out of Food in the Last Year: Not on file  Transportation Needs:   . Lack of Transportation (Medical): Not on file  . Lack of Transportation (Non-Medical): Not on file  Physical Activity:   . Days of Exercise per Week: Not on file  . Minutes of Exercise per Session: Not on file  Stress:   . Feeling of Stress : Not on file  Social Connections:   . Frequency of Communication with Friends and Family: Not on file  . Frequency of Social Gatherings with Friends and Family: Not on file  . Attends Religious Services: Not on file  . Active Member of Clubs or Organizations: Not on file  . Attends Archivist Meetings: Not on file  . Marital  Status: Not on file     Family History: The patient's family history includes Diabetes in his mother; Heart attack in his brother and maternal aunt; Heart failure in his mother; Hypertension in his mother.  ROS:   Please see the history of present illness.     All other systems reviewed and are negative.  EKGs/Labs/Other Studies Reviewed:    The following studies were reviewed today:  Echo 12/10/19: 1. Left ventricular ejection fraction, by estimation, is 40 to 45%. The  left ventricle has mildly decreased function. The left ventricle  demonstrates global hypokinesis. There is severe left ventricular  hypertrophy of the basal-septal segment. Left  ventricular diastolic parameters are consistent with Grade II diastolic  dysfunction (pseudonormalization). There is severe hypokinesis of the left  ventricular, entire inferior wall and inferoseptal wall.  2. Right ventricular systolic function is normal. The right ventricular  size is mildly enlarged. There is normal pulmonary artery systolic  pressure. The estimated right ventricular systolic pressure is 28.3 mmHg.  3. Left atrial size was mild to moderately dilated.  4. The mitral valve is normal in structure. Moderate mitral valve  regurgitation. No evidence of mitral stenosis.  5. The aortic valve is tricuspid. Aortic valve regurgitation is not  visualized. Mild aortic valve stenosis. Aortic valve area, by VTI measures  1.51 cm. Aortic valve mean gradient measures 20.0 mmHg. Aortic valve Vmax  measures 2.46 m/s. The degree of AS  may be underestimated in the setting of LV dysfunction. rree-=--  6. Aortic dilatation noted. There is mild dilatation of the aortic root  and of the ascending aorta measuring 42 mm and 27mm respectively.  7. The inferior vena cava is dilated in size with >50% respiratory  variability, suggesting right atrial pressure of 8 mmHg.   EKG:  EKG is  ordered today.  The ekg ordered today demonstrates  sinus rhythm with HR 85, premature contractions, poor R wave progression stable from prior  Recent Labs: 11/19/2019: BNP 1,139.7; Magnesium 2.3 11/25/2019: Hemoglobin 13.2; Platelets 147 01/02/2020: ALT 16; BUN 27; Creatinine, Ser 1.35; Potassium 4.4; Pro B Natriuretic peptide (BNP) 1,167.0; Sodium 140  Recent Lipid Panel No results found for: CHOL, TRIG, HDL, CHOLHDL, VLDL, LDLCALC, LDLDIRECT  Physical Exam:    VS:  BP 110/72   Pulse 96   Ht 5\' 9"  (1.753 m)   Wt 178 lb 6.4 oz (80.9 kg)   SpO2 94%   BMI 26.35 kg/m     Wt Readings from Last 3 Encounters:  03/17/20 178 lb 6.4 oz (80.9 kg)  03/10/20 182 lb 6.4 oz (82.7 kg)  01/22/20 178 lb (80.7 kg)     GEN: elderly male in no acute distress HEENT: Normal NECK: No JVD; No carotid bruits LYMPHATICS: No lymphadenopathy CARDIAC: RRR, no murmurs, rubs, gallops RESPIRATORY:  Clear to auscultation without rales, wheezing or rhonchi  ABDOMEN: Soft, non-tender, non-distended MUSCULOSKELETAL:  No edema; No deformity  SKIN: Warm and dry NEUROLOGIC:  Alert and oriented x 3 PSYCHIATRIC:  Normal affect   ASSESSMENT:    1. Chronic combined systolic and diastolic heart failure ([place])   2. Chronic venous insufficiency   3. Atherosclerosis of native coronary artery of native heart without angina pectoris   4. Essential hypertension   5. Localized edema   6. Hyperlipidemia, unspecified hyperlipidemia type    PLAN:    In order of problems listed above:  Chronic systolic and diastolic heart failure - EF 40-45% - taking lasix 40 mg ever other day - he is not taking losartan due to hypotension - BP has been marginal - appears euvolemic - will order repeat echo to evaluate EF and WMA - if EF still down, could consider 12.5 mg losartan on days that he doesn't take lasix   Chronic venous insufficiency - hx of L > R - taking lasix 40 mg daily - continue compression socks and elevation when possible - legs look great  today   Hypertension - BP has been marginal - non change in medications   Hyperlipidemia with LDL goal < 70 07/2019:  Total chol: 203 Tri 171 HDL 63 LDL 111 - was intolerant to simvastatin in he past - consider trying another statin after he establishes an exercise regimen   Coronary artery calcifications - seen on CT - no angina - continue 81 mg ASA   I have provided a letter giving permission for very gradual increase in cardio. He may start out walking 1 minute on a track, not treadmill, 2-3 times per session. Will attempt to increase occasions rather than time at first.   Follow up in 1 month.  Echo pending.   Medication Adjustments/Labs and Tests Ordered: Current medicines are reviewed at length with the patient today.  Concerns regarding medicines are outlined above.  Orders Placed This Encounter  Procedures  . ECHOCARDIOGRAM COMPLETE   No orders of the defined types were placed in this encounter.   Signed, Ledora Bottcher, PA  03/17/2020 2:48 PM    [facility]

## 2020-03-17 ENCOUNTER — Ambulatory Visit: Payer: Medicare HMO | Admitting: Physician Assistant

## 2020-03-17 ENCOUNTER — Encounter: Payer: Self-pay | Admitting: Physician Assistant

## 2020-03-17 ENCOUNTER — Other Ambulatory Visit: Payer: Self-pay

## 2020-03-17 VITALS — BP 110/72 | HR 96 | Ht 69.0 in | Wt 178.4 lb

## 2020-03-17 DIAGNOSIS — E785 Hyperlipidemia, unspecified: Secondary | ICD-10-CM | POA: Diagnosis not present

## 2020-03-17 DIAGNOSIS — R600 Localized edema: Secondary | ICD-10-CM

## 2020-03-17 DIAGNOSIS — I872 Venous insufficiency (chronic) (peripheral): Secondary | ICD-10-CM | POA: Diagnosis not present

## 2020-03-17 DIAGNOSIS — I5042 Chronic combined systolic (congestive) and diastolic (congestive) heart failure: Secondary | ICD-10-CM | POA: Diagnosis not present

## 2020-03-17 DIAGNOSIS — I2510 Atherosclerotic heart disease of native coronary artery without angina pectoris: Secondary | ICD-10-CM

## 2020-03-17 DIAGNOSIS — I10 Essential (primary) hypertension: Secondary | ICD-10-CM | POA: Diagnosis not present

## 2020-03-17 NOTE — Patient Instructions (Signed)
Medication Instructions:  Continue current medications  *If you need a refill on your cardiac medications before your next appointment, please call your pharmacy*   Lab Work: None Ordered  Testing/Procedures: Your physician has requested that you have an echocardiogram. Echocardiography is a painless test that uses sound waves to create images of your heart. It provides your doctor with information about the size and shape of your heart and how well your heart's chambers and valves are working. This procedure takes approximately one hour. There are no restrictions for this procedure.   Follow-Up: At [facility], you and your health needs are our priority.  As part of our continuing mission to provide you with exceptional heart care, we have created designated Provider Care Teams.  These Care Teams include your primary Cardiologist (physician) and Advanced Practice Providers (APPs -  Physician Assistants and Nurse Practitioners) who all work together to provide you with the care you need, when you need it.  We recommend signing up for the patient portal called "MyChart".  Sign up information is provided on this After Visit Summary.  MyChart is used to connect with patients for Virtual Visits (Telemedicine).  Patients are able to view lab/test results, encounter notes, upcoming appointments, etc.  Non-urgent messages can be sent to your provider as well.   To learn more about what you can do with MyChart, go to [email or web address].com.    Your next appointment:   1 month(s)  The format for your next appointment:   In Person  Provider:   You may see Peter Jordan, MD or one of the following Advanced Practice Providers on your designated Care Team:    Hao Meng, PA-C  Angela Duke, PA-C or   Krista Kroeger, PA-C      

## 2020-03-19 ENCOUNTER — Other Ambulatory Visit (INDEPENDENT_AMBULATORY_CARE_PROVIDER_SITE_OTHER): Payer: Medicare HMO

## 2020-03-19 DIAGNOSIS — I10 Essential (primary) hypertension: Secondary | ICD-10-CM

## 2020-03-24 ENCOUNTER — Telehealth: Payer: Self-pay | Admitting: Physician Assistant

## 2020-03-24 NOTE — Telephone Encounter (Signed)
   Pt called, he said he had an appt with Fabian Sharp last 09/20, when he left he got his AVS and he noticed it is someone else AVS it's for [employer]. He would like to get his own AVS and worried that Durwin Reges got his AVS

## 2020-03-24 NOTE — Telephone Encounter (Signed)
Left a message for the patient to let him know that we would mail out his AVS and apologized for the mix up.

## 2020-03-24 NOTE — Telephone Encounter (Signed)
Pts correct AVS mailed to him.

## 2020-03-31 ENCOUNTER — Other Ambulatory Visit (HOSPITAL_COMMUNITY): Payer: Medicare HMO

## 2020-04-01 ENCOUNTER — Other Ambulatory Visit: Payer: Self-pay

## 2020-04-01 ENCOUNTER — Ambulatory Visit (HOSPITAL_COMMUNITY): Payer: Medicare HMO | Attending: Cardiology

## 2020-04-01 DIAGNOSIS — I5042 Chronic combined systolic (congestive) and diastolic (congestive) heart failure: Secondary | ICD-10-CM | POA: Insufficient documentation

## 2020-04-01 LAB — ECHOCARDIOGRAM COMPLETE
AR max vel: 1.31 cm2
AV Area VTI: 1.14 cm2
AV Area mean vel: 1.15 cm2
AV Mean grad: 24 mmHg
AV Peak grad: 19.9 mmHg
Ao pk vel: 2.23 m/s
Area-P 1/2: 4.39 cm2
MV M vel: 5.23 m/s
MV Peak grad: 109.4 mmHg
S' Lateral: 4.5 cm

## 2020-04-02 ENCOUNTER — Telehealth: Payer: Self-pay | Admitting: Physician Assistant

## 2020-04-02 DIAGNOSIS — R338 Other retention of urine: Secondary | ICD-10-CM | POA: Diagnosis not present

## 2020-04-02 DIAGNOSIS — R310 Gross hematuria: Secondary | ICD-10-CM | POA: Diagnosis not present

## 2020-04-02 DIAGNOSIS — R8271 Bacteriuria: Secondary | ICD-10-CM | POA: Diagnosis not present

## 2020-04-02 NOTE — Telephone Encounter (Signed)
error 

## 2020-04-10 DIAGNOSIS — N183 Chronic kidney disease, stage 3 unspecified: Secondary | ICD-10-CM | POA: Diagnosis not present

## 2020-04-10 DIAGNOSIS — I10 Essential (primary) hypertension: Secondary | ICD-10-CM | POA: Diagnosis not present

## 2020-04-10 DIAGNOSIS — I5040 Unspecified combined systolic (congestive) and diastolic (congestive) heart failure: Secondary | ICD-10-CM | POA: Diagnosis not present

## 2020-04-14 ENCOUNTER — Ambulatory Visit: Payer: Medicare HMO | Admitting: Cardiology

## 2020-04-15 ENCOUNTER — Telehealth: Payer: Self-pay | Admitting: Physician Assistant

## 2020-04-15 NOTE — Telephone Encounter (Signed)
Pt called in and would like a copy of his Echo on 10/5 Fax over to his PCP office  Dr Rex Kras .  Pt did not have the fax number  Phone number  - [phone]

## 2020-04-15 NOTE — Telephone Encounter (Signed)
Will send this message to [facility] medical records dept to fax pts recent echo results, to PCP Dr. Eddie Dibbles office.

## 2020-04-15 NOTE — Telephone Encounter (Signed)
[name] [name] is calling requesting his copy of the results he is requesting for his self be mailed to him at the address listed on file for him.

## 2020-04-15 NOTE — Telephone Encounter (Signed)
Will send this updated message to [facility] Records to send the pts echo to his PCP and to the pts mailing address on file.

## 2020-04-17 NOTE — Telephone Encounter (Signed)
Echo report faxed to Dr. Rex Kras at [place] & a copy mailed to his home address 04/17/20

## 2020-04-21 ENCOUNTER — Telehealth: Payer: Medicare HMO | Admitting: Physician Assistant

## 2020-04-21 NOTE — Progress Notes (Addendum)
Virtual Visit via Telephone Note   This visit type was conducted due to national recommendations for restrictions regarding the COVID-19 Pandemic (e.g. social distancing) in an effort to limit this patient's exposure and mitigate transmission in our community.  Due to his co-morbid illnesses, this patient is at least at moderate risk for complications without adequate follow up.  This format is felt to be most appropriate for this patient at this time.  The patient did not have access to video technology/had technical difficulties with video requiring transitioning to audio format only (telephone).  All issues noted in this document were discussed and addressed.  No physical exam could be performed with this format.  Please refer to the patient's chart for his  consent to telehealth for [facility].    Date:  04/22/2020   ID:  [name], DOB September 27, 1927, MRN [id number] The patient was identified using 2 identifiers.  Patient Location: Home Provider Location: Office/Clinic  PCP:  [name], MD  Cardiologist:  Peter Martinique, MD  Electrophysiologist:  None   Evaluation Performed:  Follow-Up Visit  Chief Complaint:  Acute on chronic systolic and diastolic heart failure  History of Present Illness:    Oziel Beitler is a 86 y.o. male with a hx of HTN, venous insufficiency, HLD, OA, and chronic bilateral leg edema, peripheral neuropathy, essential tremor, and prostate cancer.  Echo in 2014 with normla EF, moderate asymmetric basal septal hypertrophy and no LV outflow tract gradient, mild to moderate MR. He has chronic leg edema due to venous insufficiency. He was diagnosed with RML PNA 10/12/19, COVID negative. He did not tolerate Levaquin due to tendonitis, but completed azithromycin. Repeat CXR with increased density of RLL PNA, CXR 11/14/19 with continued basilar PNA and increased right pleural effusion. BNP was elevated to 1380. He was started on 90 mg lasix and CT chest showed mild  cardiomegaly, moderate right pleural effusion with adjacent atelectasis vs PNA. He follows with pulmonology. Prior to lasix, he saw urology for urinary retention and had catheter placed. Foley has since been removed and he self in and out caths. Echo 12/10/19 with EF of 40-45%, severe LVH, grade 2 DD, wall motion abnormality in the entire inferior wall and inferoseptal wall, moderate MR, and AS, aortic root and AAA of 42 mm. In discussion with Dr. Martinique, [facility] treated medically as long as he is asymptomatic, possibly repeat echo in 3 months.   Soft BP have limited titration of heart failure medications. BB and ACEI previously stopped for hypotension. He is now taking losartan 12.5 mg every other day and 40 mg lasix daily. He was last seen by Dr. Martinique 12/26/19. CHF medications limited by hypotension.   I saw him 03/17/20 for an office visit to request permission to exercise. I repeated an echocardiogram due to worsening fatigue and SOB at rest. Lower extremities looked good at that visit. Repeat echo showed EF 30-35% with LV dyssynchrony, low flow severe AS, moderately elevated pulmonary artery pressure, biatrial enlargement L > R, ascending aorta was 44 mm, increased atrial pressure.   He returns today for follow up. He states he has gained 10-11 lbs over the last week. He states this came on suddenly. He has lower extremity swelling in his thighs and calves. He is taking 40 mg lasix BID x 2 days with increased urine output. He self-cath's and has had an increase in urine output. BP was in the 130s prior to increasing lasix, but now is in the 734-193X systolic, which  is good for him. Weight has decreased today - has dropped one pound yesterday and today.He denies dyspnea and orthopnea. He has started his exercise regimen at the gym, but he is extremely fatigued. He is doing some weight training and slow walking. He is taking 99 mg OTC potassium. No chest pain.     The patient does not have symptoms  concerning for COVID-19 infection (fever, chills, cough, or new shortness of breath).    Past Medical History:  Diagnosis Date  . Arthritis   . Colitis   . Edema   . Essential hypertension    no med now  . Essential tremor   . Hyperlipidemia   . Hypotonic bladder    L4-L5  . Obstructive uropathy   . Peripheral neuropathy    Elevated by Dr. love 2010  . Prostate cancer ([place]) 2005   Past Surgical History:  Procedure Laterality Date  . ACHILLES TENDON SURGERY Bilateral 1995  . COLONOSCOPY     With polyp resection  . REVERSE SHOULDER ARTHROPLASTY Right 06/04/2016  . REVERSE SHOULDER ARTHROPLASTY Right 06/04/2016   Procedure: REVERSE SHOULDER ARTHROPLASTY;  Surgeon: Netta Cedars, MD;  Location: [facility];  Service: Orthopedics;  Laterality: Right;  . SKIN CANCER EXCISION  1974   On chest wall  . TOTAL KNEE ARTHROPLASTY Right 2011  . TOTAL KNEE ARTHROPLASTY Left 04/09/2013   Procedure: LEFT TOTAL KNEE ARTHROPLASTY;  Surgeon: Gearlean Alf, MD;  Location: [facility];  Service: Orthopedics;  Laterality: Left;     Current Meds  Medication Sig  . aspirin 81 MG tablet Take 81 mg by mouth at bedtime.   . dutasteride (AVODART) 0.5 MG capsule Take 0.5 mg by mouth daily.  . folic acid (FOLVITE) 161 MCG tablet Take 800 mcg by mouth daily.   . melatonin 5 MG TABS Take 5 mg by mouth at bedtime as needed.  . Multiple Vitamins-Minerals (CENTRUM SILVER PO) Take 1 tablet by mouth daily.   Marland Kitchen sulfaSALAzine (AZULFIDINE) 500 MG tablet Take 2,000 mg by mouth daily.   . tamsulosin (FLOMAX) 0.4 MG CAPS Take 0.4 mg by mouth 2 (two) times daily.   . [DISCONTINUED] furosemide (LASIX) 40 MG tablet Take 40 mg by mouth every other day.     Allergies:   Simvastatin   Social History   Tobacco Use  . Smoking status: Never Smoker  . Smokeless tobacco: Never Used  Vaping Use  . Vaping Use: Never used  Substance Use Topics  . Alcohol use: Yes    Alcohol/week: 1.0 standard drink    Types: 1 Glasses of wine  per week    Comment: occ  . Drug use: No     Family Hx: The patient's family history includes Diabetes in his mother; Heart attack in his brother and maternal aunt; Heart failure in his mother; Hypertension in his mother.  ROS:   Please see the history of present illness.     All other systems reviewed and are negative.   Prior CV studies:   The following studies were reviewed today:  Echo 04/01/20: 1. LVEF has decreased from 12/10/2019, now 30-35% with LV dyssynchrony.  Aortic stenosis is most probably severe with low flow low gradient,  dimensionless index 0.26.  2. Left ventricular ejection fraction, by estimation, is 30 to 35%. The  left ventricle has moderately decreased function. The left ventricle  demonstrates global hypokinesis. The left ventricular internal cavity size  was mildly dilated. There is severe  asymmetric left ventricular hypertrophy  of the basal-septal segment. Left  ventricular diastolic function could not be evaluated.  3. Right ventricular systolic function is mildly reduced. The right  ventricular size is mildly enlarged. There is moderately elevated  pulmonary artery systolic pressure. The estimated right ventricular  systolic pressure is 66.5 mmHg.  4. Left atrial size was severely dilated.  5. Right atrial size was moderately dilated.  6. The mitral valve is normal in structure. Moderate mitral valve  regurgitation. No evidence of mitral stenosis.  7. The aortic valve is normal in structure. Aortic valve regurgitation is  not visualized. Moderate to severe aortic valve stenosis. Aortic valve  mean gradient measures 24.0 mmHg.  8. Aortic dilatation noted. There is mild to moderate dilatation of the  ascending aorta, measuring 44 mm.  9. The inferior vena cava is dilated in size with <50% respiratory  variability, suggesting right atrial pressure of 15 mmHg.   Labs/Other Tests and Data Reviewed:    EKG:  No ECG reviewed.  Recent  Labs: 11/19/2019: BNP 1,139.7; Magnesium 2.3 11/25/2019: Hemoglobin 13.2; Platelets 147 01/02/2020: ALT 16; BUN 27; Creatinine, Ser 1.35; Potassium 4.4; Pro B Natriuretic peptide (BNP) 1,167.0; Sodium 140   Recent Lipid Panel No results found for: CHOL, TRIG, HDL, CHOLHDL, LDLCALC, LDLDIRECT  Wt Readings from Last 3 Encounters:  04/22/20 182 lb (82.6 kg)  03/17/20 178 lb 6.4 oz (80.9 kg)  03/10/20 182 lb 6.4 oz (82.7 kg)     Risk Assessment/Calculations:      Objective:    Vital Signs:  BP 112/79   Pulse 96   Ht 5\' 9"  (1.753 m)   Wt 182 lb (82.6 kg)   BMI 26.88 kg/m    VITAL SIGNS:  reviewed GEN:  no acute distress RESPIRATORY:  respiations unlabored NEURO:  alert and oriented x 3, no obvious focal deficit PSYCH:  normal affect  ASSESSMENT & PLAN:    Acute on chronic systolic and diastolic heart failure - EF is now 30-35% from 40-45% with LV dyssiynchrony  - he increased his lasix regimen to 40 mg BID 2 days ago with increased diuresis and 1 lb weight loss 2 days ago - I agree with the increased lasix - his pressure is actually better than expected - he previously stopped losartan due to hypotension but I think we can attempt to add back 12.5 mg   - continue OTC 99 mg K supplement - needs to be seen in the office next week with a BMP  Given his age, EF, WMA, severe AS, dyssynchrony, and borderline BP, I am concerned that he could decompensate quickly. He likely is having ischemia.  We will first work on getting him more euvolemic. I suspect he will require 40 mg lasix BID going forward. The next step may be right and left heart cath and potentially consult to structural heart team. He will see Coletta Memos NP next week to check volume and labs. He will then discuss next options with Dr. Martinique on Nov 17 at 10:20.   Severe low-flow AS - likely contributing to his volume status - will discuss with Dr. Martinique - ?referral to structural heart team   Chronic venous  insufficiency - hx of L > R - lasix as above - continue compression socks and elevation when possible   Hypertension - BP has been marginal - will attempt to add back 12.5 mg losartan   Hyperlipidemia with LDL goal < 70 07/2019: Total chol: 203 Tri 171 HDL 63 LDL 111 -  was intolerant to simvastatin in he past - should be on a statin - will retry lipitor    Coronary artery calcifications - seen on CT - no angina - continue 81 mg ASA - new WMA and lower EF - concern for ischemia - I will ask him to stop going to the gym for now   I will send to Dr. Martinique to review echo and for further recommendations.   COVID-19 Education: The signs and symptoms of COVID-19 were discussed with the patient and how to seek care for testing (follow up with PCP or arrange E-visit).  The importance of social distancing was discussed today.  Time:   Today, I have spent 27 minutes with the patient with telehealth technology discussing the above problems.     Medication Adjustments/Labs and Tests Ordered: Current medicines are reviewed at length with the patient today.  Concerns regarding medicines are outlined above.   Tests Ordered: No orders of the defined types were placed in this encounter.   Medication Changes: No orders of the defined types were placed in this encounter.   Follow Up:  In Person in 1 week(s)  Signed, Ledora Bottcher, [place]  04/22/2020 2:34 PM    [facility]

## 2020-04-22 ENCOUNTER — Telehealth: Payer: Self-pay

## 2020-04-22 ENCOUNTER — Encounter: Payer: Self-pay | Admitting: Physician Assistant

## 2020-04-22 ENCOUNTER — Telehealth (INDEPENDENT_AMBULATORY_CARE_PROVIDER_SITE_OTHER): Payer: Medicare HMO | Admitting: Physician Assistant

## 2020-04-22 VITALS — BP 112/79 | HR 96 | Ht 69.0 in | Wt 182.0 lb

## 2020-04-22 DIAGNOSIS — I2510 Atherosclerotic heart disease of native coronary artery without angina pectoris: Secondary | ICD-10-CM | POA: Diagnosis not present

## 2020-04-22 DIAGNOSIS — I350 Nonrheumatic aortic (valve) stenosis: Secondary | ICD-10-CM

## 2020-04-22 DIAGNOSIS — I5043 Acute on chronic combined systolic (congestive) and diastolic (congestive) heart failure: Secondary | ICD-10-CM | POA: Diagnosis not present

## 2020-04-22 DIAGNOSIS — I872 Venous insufficiency (chronic) (peripheral): Secondary | ICD-10-CM | POA: Diagnosis not present

## 2020-04-22 DIAGNOSIS — I10 Essential (primary) hypertension: Secondary | ICD-10-CM

## 2020-04-22 MED ORDER — LOSARTAN POTASSIUM 25 MG PO TABS
ORAL_TABLET | ORAL | 3 refills | Status: DC
Start: 2020-04-22 — End: 2020-05-15

## 2020-04-22 MED ORDER — FUROSEMIDE 40 MG PO TABS
40.0000 mg | ORAL_TABLET | Freq: Two times a day (BID) | ORAL | 3 refills | Status: DC
Start: 2020-04-22 — End: 2020-06-26

## 2020-04-22 NOTE — Patient Instructions (Signed)
Medication Instructions: Start Losartan 12.5mg  (Half 25mg  Tablet Daily) Lasix 40mg  (1 Tablet Twice Daily) *If you need a refill on your cardiac medications before your next appointment, please call your pharmacy*   Lab Work: BMP ( next Week) If you have labs (blood work) drawn today and your tests are completely normal, you will receive your results only by: Marland Kitchen MyChart Message (if you have MyChart) OR . A paper copy in the mail If you have any lab test that is abnormal or we need to change your treatment, we will call you to review the results.   Testing/Procedures: No Testing   Follow-Up: At [facility], you and your health needs are our priority.  As part of our continuing mission to provide you with exceptional heart care, we have created designated Provider Care Teams.  These Care Teams include your primary Cardiologist (physician) and Advanced Practice Providers (APPs -  Physician Assistants and Nurse Practitioners) who all work together to provide you with the care you need, when you need it.      Your next appointment:   April 30, 2020  The format for your next appointment:   Virtual Visit   Provider:   Coletta Memos NP-C

## 2020-04-22 NOTE — Progress Notes (Signed)
Thanks Dr. Martinique! Will you just send [place] your cath note and we can get him in after that. Thank you!  KT

## 2020-04-22 NOTE — Telephone Encounter (Signed)
The patient has been notified of the result and verbalized understanding.  All questions (if any) were answered. [name], [place] 04/22/2020 4:11 PM Spoke with patient to advise per Dr. Martinique d/c going to gym until office visit on May 14 2020

## 2020-04-22 NOTE — Telephone Encounter (Signed)
[name], baratta are scheduled for a virtual visit with your provider today.    Just as we do with appointments in the office, we must obtain your consent to participate.  Your consent will be active for this visit and any virtual visit you may have with one of our providers in the next 365 days.    If you have a MyChart account, I can also send a copy of this consent to you electronically.  All virtual visits are billed to your insurance company just like a traditional visit in the office.  As this is a virtual visit, video technology does not allow for your provider to perform a traditional examination.  This may limit your provider's ability to fully assess your condition.  If your provider identifies any concerns that need to be evaluated in person or the need to arrange testing such as labs, EKG, etc, we will make arrangements to do so.    Although advances in technology are sophisticated, we cannot ensure that it will always work on either your end or our end.  If the connection with a video visit is poor, we may have to switch to a telephone visit.  With either a video or telephone visit, we are not always able to ensure that we have a secure connection.   I need to obtain your verbal consent now.   Are you willing to proceed with your visit today?   [name] [name] has provided verbal consent on 04/22/2020 for a virtual visit (video or telephone).   [name], CMA 04/22/2020  1:49 PM

## 2020-04-23 ENCOUNTER — Ambulatory Visit: Payer: Medicare HMO | Admitting: General Practice

## 2020-04-28 DIAGNOSIS — I872 Venous insufficiency (chronic) (peripheral): Secondary | ICD-10-CM | POA: Diagnosis not present

## 2020-04-28 DIAGNOSIS — I5043 Acute on chronic combined systolic (congestive) and diastolic (congestive) heart failure: Secondary | ICD-10-CM | POA: Diagnosis not present

## 2020-04-28 DIAGNOSIS — I10 Essential (primary) hypertension: Secondary | ICD-10-CM | POA: Diagnosis not present

## 2020-04-28 DIAGNOSIS — I350 Nonrheumatic aortic (valve) stenosis: Secondary | ICD-10-CM | POA: Diagnosis not present

## 2020-04-28 DIAGNOSIS — I2510 Atherosclerotic heart disease of native coronary artery without angina pectoris: Secondary | ICD-10-CM | POA: Diagnosis not present

## 2020-04-29 LAB — BASIC METABOLIC PANEL
BUN/Creatinine Ratio: 12 (ref 10–24)
BUN: 17 mg/dL (ref 10–36)
CO2: 24 mmol/L (ref 20–29)
Calcium: 9.3 mg/dL (ref 8.6–10.2)
Chloride: 98 mmol/L (ref 96–106)
Creatinine, Ser: 1.45 mg/dL — ABNORMAL HIGH (ref 0.76–1.27)
GFR calc Af Amer: 49 mL/min/{1.73_m2} — ABNORMAL LOW (ref >59)
GFR calc non Af Amer: 42 mL/min/{1.73_m2} — ABNORMAL LOW (ref >59)
Glucose: 176 mg/dL — ABNORMAL HIGH (ref 65–99)
Potassium: 4.3 mmol/L (ref 3.5–5.2)
Sodium: 137 mmol/L (ref 134–144)

## 2020-04-30 ENCOUNTER — Telehealth: Payer: Medicare HMO | Admitting: General Practice

## 2020-05-05 DIAGNOSIS — R69 Illness, unspecified: Secondary | ICD-10-CM | POA: Diagnosis not present

## 2020-05-10 NOTE — Progress Notes (Deleted)
Cardiology Office Note   Date:  05/10/2020   ID:  [name] [name], DOB 15-Mar-1928, MRN [id number]  PCP:  [name], MD  Cardiologist:   Azlaan Isidore Martinique, MD   No chief complaint on file.     History of Present Illness: [name] [name] is a 86 y.o. male who is seen for follow up CHF, progressive AS. He has a history of venous insufficiency with chronic lower extremity edema (mostly on the left), hypertension, hyperlipidemia, peripheral neuropathy, essential tremor, and prostate cancer.  Patient first seen  in 09/2012 for evaluation of dyspnea on exertion and lower extremity edema, especially in his left leg. Venous dopplers were ordered and showed no DVT. Echo showed LVEF of 55-60% with moderate asymmetric basal septal hypertrophy with no LV outflow tract gradient and mild to moderate MR. He was seen  in 07/2017 at which time he was doing well. He denied any chest pain or shortness of breath. He continued to report some lower extremity edema that resolved with elevation of legs at night. Edema felt to be due to venous insufficiency and conservative management with sodium restriction, elevation of legs, and compression stockings was recommended.   He was diagnosed with right middle lobe pneumonia on 10/12/2019 after presenting for further evaluation of fatigue while on vacation at the beach.. He tested negative for COVID. He was started on Levaquin but this had to be stopped when he developed tendonitis. He was started on Azithromycin instead on 10/26/2019. Repeat chest x-ray on 10/26/2019 showed increased density of right lower lobe pneumonia. He had persistent fatigue  and developed significant lower extremity edema and weight gain. Patient was seen by PCP again on 11/14/2019. Repeat chest x-ray showed continued right basilar pneumonia and increased right pleural effusion. BNP was checked and came back elevated at 1,380. He was started on Lasix 77m daily. Chest CT was also ordered to rule out  underlying lung mass. CT was performed on5/20/2021andshowed mild cardiomegaly, moderate right pleural effusion with adjacent atelectasis or pneumonia in the right lower and middle lobes as well as coronary artery calcifications.Of note, prior to being started on Lasix, he was seen by Urology for urinary retention and had a catheter placed.  He was taking lasix 80 mg daily but this has been reduced to 40 mg. Foley catheter was removed and he is now in and out cathing himself every other day.  He had an Echo done in June showing EF 40-45% with severe HK of the entire inferior wall. There was severe basal septal hypertrophy with gr 2 diastolic dysfunction. Moderate MR, mild AS and mild Aortic root enlargement. We started him on losartan 25 mg daily but dose had to be reduced to 12.5 mg due to hypotension. He was seen in September with worsening CHF with weight gain, edema and worsening fatigue. Lasix dose was increased. Echo showed further decline in LV function with EF 30-35%. AS was felt to be moderate to severe ( low flow AS). Now seen today to discuss.   He is seen with his wife today. His edema is markedly improved. He is wearing zip up compression hose. He has reduced his lasix to 20 mg daily. His BP is running low. He is  fatigued.  Denies any chest pain. Notes his breathing is OK and he has no cough. Weight is stable.   Past Medical History:  Diagnosis Date  . Arthritis   . Colitis   . Edema   . Essential hypertension    no  med now  . Essential tremor   . Hyperlipidemia   . Hypotonic bladder    L4-L5  . Obstructive uropathy   . Peripheral neuropathy    Elevated by Dr. love 2010  . Prostate cancer ([place]) 2005    Past Surgical History:  Procedure Laterality Date  . ACHILLES TENDON SURGERY Bilateral 1995  . COLONOSCOPY     With polyp resection  . REVERSE SHOULDER ARTHROPLASTY Right 06/04/2016  . REVERSE SHOULDER ARTHROPLASTY Right 06/04/2016   Procedure: REVERSE SHOULDER  ARTHROPLASTY;  Surgeon: Netta Cedars, MD;  Location: [facility];  Service: Orthopedics;  Laterality: Right;  . SKIN CANCER EXCISION  1974   On chest wall  . TOTAL KNEE ARTHROPLASTY Right 2011  . TOTAL KNEE ARTHROPLASTY Left 04/09/2013   Procedure: LEFT TOTAL KNEE ARTHROPLASTY;  Surgeon: Gearlean Alf, MD;  Location: [facility];  Service: Orthopedics;  Laterality: Left;     Current Outpatient Medications  Medication Sig Dispense Refill  . aspirin 81 MG tablet Take 81 mg by mouth at bedtime.     . dutasteride (AVODART) 0.5 MG capsule Take 0.5 mg by mouth daily.    . folic acid (FOLVITE) 829 MCG tablet Take 800 mcg by mouth daily.     . furosemide (LASIX) 40 MG tablet Take 1 tablet (40 mg total) by mouth 2 (two) times daily. 60 tablet 3  . losartan (COZAAR) 25 MG tablet Take 12.5 mg 1 Time Daily 45 tablet 3  . melatonin 5 MG TABS Take 5 mg by mouth at bedtime as needed.    . Multiple Vitamins-Minerals (CENTRUM SILVER PO) Take 1 tablet by mouth daily.     Marland Kitchen sulfaSALAzine (AZULFIDINE) 500 MG tablet Take 2,000 mg by mouth daily.     . tamsulosin (FLOMAX) 0.4 MG CAPS Take 0.4 mg by mouth 2 (two) times daily.      No current facility-administered medications for this visit.    Allergies:   Simvastatin    Social History:  The patient  reports that he has never smoked. He has never used smokeless tobacco. He reports current alcohol use of about 1.0 standard drink of alcohol per week. He reports that he does not use drugs.   Family History:  The patient's family history includes Diabetes in his mother; Heart attack in his brother and maternal aunt; Heart failure in his mother; Hypertension in his mother.    ROS:  Please see the history of present illness.   Otherwise, review of systems are positive for none.   All other systems are reviewed and negative.    PHYSICAL EXAM: VS:  There were no vitals taken for this visit. , BMI There is no height or weight on file to calculate BMI. GEN: Well  nourished, well developed, in no acute distress  HEENT: normal  Neck: no JVD, carotid bruits, or masses Cardiac: RRR; no murmurs, rubs, or gallops,no edema  Respiratory:  clear to auscultation bilaterally, normal work of breathing GI: soft, nontender, nondistended, + BS MS: no deformity or atrophy  Skin: warm and dry, no rash Neuro:  Strength and sensation are intact Psych: euthymic mood, full affect   EKG:  EKG is not ordered today. The ekg ordered today demonstrates N/A   Recent Labs: 11/19/2019: BNP 1,139.7; Magnesium 2.3 11/25/2019: Hemoglobin 13.2; Platelets 147 01/02/2020: ALT 16; Pro B Natriuretic peptide (BNP) 1,167.0 04/28/2020: BUN 17; Creatinine, Ser 1.45; Potassium 4.3; Sodium 137    Lipid Panel No results found for: CHOL, TRIG, HDL, CHOLHDL,  VLDL, LDLCALC, LDLDIRECT    Wt Readings from Last 3 Encounters:  04/22/20 182 lb (82.6 kg)  03/17/20 178 lb 6.4 oz (80.9 kg)  03/10/20 182 lb 6.4 oz (82.7 kg)    - 10/12/2019: WBC 7.6, Hgb 14.2, Plts 246. Na 140, K 4.4, Glucose 103, BUN 12, Cr 1.26. AST 29, ALT 21, Alk Phos 61, Total Bili 0.5. TSH 0.849. UA showed 31m of protein but was otherwise unremarkable.  - 10/26/2019: Cr 1.20.  - 11/14/2019: BNP 1,380. Na 140, K 4.6, Glucose 107, BUN 22, Cr 1.16. AST 26, ALT, Alk Phos 59. Dated 12/06/19: BUN 27, creatinine 1.4. CBC and CMET normal.  Other studies Reviewed: Additional studies/ records that were reviewed today include:  Echo 12/10/19: IMPRESSIONS    1. Left ventricular ejection fraction, by estimation, is 40 to 45%. The  left ventricle has mildly decreased function. The left ventricle  demonstrates global hypokinesis. There is severe left ventricular  hypertrophy of the basal-septal segment. Left  ventricular diastolic parameters are consistent with Grade II diastolic  dysfunction (pseudonormalization). There is severe hypokinesis of the left  ventricular, entire inferior wall and inferoseptal wall.  2. Right  ventricular systolic function is normal. The right ventricular  size is mildly enlarged. There is normal pulmonary artery systolic  pressure. The estimated right ventricular systolic pressure is 359.4mmHg.  3. Left atrial size was mild to moderately dilated.  4. The mitral valve is normal in structure. Moderate mitral valve  regurgitation. No evidence of mitral stenosis.  5. The aortic valve is tricuspid. Aortic valve regurgitation is not  visualized. Mild aortic valve stenosis. Aortic valve area, by VTI measures  1.51 cm. Aortic valve mean gradient measures 20.0 mmHg. Aortic valve Vmax  measures 2.46 m/s. The degree of AS  may be underestimated in the setting of LV dysfunction. rree-=--  6. Aortic dilatation noted. There is mild dilatation of the aortic root  and of the ascending aorta measuring 42 mm and 45mrespectively.  7. The inferior vena cava is dilated in size with >50% respiratory  variability, suggesting right atrial pressure of 8 mmHg.   Echo 04/01/20: IMPRESSIONS    1. LVEF has decreased from 12/10/2019, now 30-35% with LV dyssynchrony.  Aortic stenosis is most probably severe with low flow low gradient,  dimensionless index 0.26.  2. Left ventricular ejection fraction, by estimation, is 30 to 35%. The  left ventricle has moderately decreased function. The left ventricle  demonstrates global hypokinesis. The left ventricular internal cavity size  was mildly dilated. There is severe  asymmetric left ventricular hypertrophy of the basal-septal segment. Left  ventricular diastolic function could not be evaluated.  3. Right ventricular systolic function is mildly reduced. The right  ventricular size is mildly enlarged. There is moderately elevated  pulmonary artery systolic pressure. The estimated right ventricular  systolic pressure is 5558.5mHg.  4. Left atrial size was severely dilated.  5. Right atrial size was moderately dilated.  6. The mitral valve is  normal in structure. Moderate mitral valve  regurgitation. No evidence of mitral stenosis.  7. The aortic valve is normal in structure. Aortic valve regurgitation is  not visualized. Moderate to severe aortic valve stenosis. Aortic valve  mean gradient measures 24.0 mmHg.  8. Aortic dilatation noted. There is mild to moderate dilatation of the  ascending aorta, measuring 44 mm.  9. The inferior vena cava is dilated in size with <50% respiratory  variability, suggesting right atrial pressure of 15 mmHg.  ASSESSMENT AND PLAN:  1. Acute on chronic combined systolic/diastolic CHF. EF 40-45%.  - CT scan on 11/15/2019 showed mild cardiomegaly with moderate right pleural effusion and adjacent atelectasis or pneumonia in the right lower and middle lobes. Left lung clear.  -BNP elevated at 1,380 at PCP's office on 11/14/2019. Started on Lasix 40m daily with improvement.now on 20 mg daily and weight is down significantly and edema has resolved.  Denies dyspnea.  - Echo with EF 40-45% with regional wall motion abnormality- inferior wall. - Continue Lasix 20 mg daily for now. May try taking every other day as long as weight stable and edema gone. - will discontinue losartan due to hypotension.  - continue compression hose.    2. Right Lower Lobe Pneumonia/ right pleural effusion - Initially diagnosed in 10/12/2019 but has persistent infiltrates on imaging. asypmtomatic   3. PVCs - Patient having frequent PVCs. -  potassium and Magnesium OK. - Will hold off on starting beta-blocker for now given soft BP and need for diuresis.  4.  Chronic Venous Insufficiency - Patient hashistorychronic lower extremity edema (left > right) which has been felt to be due to venous insufficiencyin the past. - Continue Lasix as above. - Continue conservative management with elevation of feet, compression stockings, and sodium restriction.  5. Hypertension - BP low. Stop losartan  Hyperlipidemia -  [facility] Total Cholesterol 203, Triglycerides 171, HDL 63, LDL 111.  - Given coronary artery calcification on recent CT scan, would ideally be on statin. Looks like he has been on Simvastatin in the past but did not tolerate this well due to neuropathy.   6. Coronary Artery Calcifications - Coronary artery calcification noted on recent CT scan.  - No angina. - Continue Aspirin 833mdaily.     Current medicines are reviewed at length with the patient today.  The patient does not have concerns regarding medicines.  The following changes have been made:  See above  Labs/ tests ordered today include:  No orders of the defined types were placed in this encounter.    Disposition:   FU with me in 6 months  Signed, Melton Walls JoMartiniqueMD  05/10/2020 3:39 PM    [id number] [phone] [phone]

## 2020-05-12 NOTE — Progress Notes (Signed)
Cardiology Office Note   Date:  05/15/2020   ID:  [name] [name], DOB Sep 21, 1927, MRN [id number]  PCP:  [name], MD  Cardiologist:   Zoey Bidwell Martinique, MD   Chief Complaint  Patient presents with  . Congestive Heart Failure  . Aortic Stenosis      History of Present Illness: [name] [name] is a 86 y.o. male who is seen for follow up CHF, progressive AS. He has a history of venous insufficiency with chronic lower extremity edema (mostly on the left), hypertension, hyperlipidemia, peripheral neuropathy, essential tremor, and prostate cancer.  Patient first seen  in 09/2012 for evaluation of dyspnea on exertion and lower extremity edema, especially in his left leg. Venous dopplers were ordered and showed no DVT. Echo showed LVEF of 55-60% with moderate asymmetric basal septal hypertrophy with no LV outflow tract gradient and mild to moderate MR. He was seen  in 07/2017 at which time he was doing well. He denied any chest pain or shortness of breath. He continued to report some lower extremity edema that resolved with elevation of legs at night. Edema felt to be due to venous insufficiency and conservative management with sodium restriction, elevation of legs, and compression stockings was recommended.   He was diagnosed with right middle lobe pneumonia on 10/12/2019 after presenting for further evaluation of fatigue while on vacation at the beach.. He tested negative for COVID. He was started on Levaquin but this had to be stopped when he developed tendonitis. He was started on Azithromycin instead on 10/26/2019. Repeat chest x-ray on 10/26/2019 showed increased density of right lower lobe pneumonia. He had persistent fatigue  and developed significant lower extremity edema and weight gain. Patient was seen by PCP again on 11/14/2019. Repeat chest x-ray showed continued right basilar pneumonia and increased right pleural effusion. BNP was checked and came back elevated at 1,380. He was started on  Lasix 74m daily. Chest CT was also ordered to rule out underlying lung mass. CT was performed on5/20/2021andshowed mild cardiomegaly, moderate right pleural effusion with adjacent atelectasis or pneumonia in the right lower and middle lobes as well as coronary artery calcifications.Of note, prior to being started on Lasix, he was seen by Urology for urinary retention and had a catheter placed.  He was taking lasix 80 mg daily but this has been reduced to 40 mg. Foley catheter was removed and he is now in and out cathing himself every other day.  He had an Echo done in June showing EF 40-45% with severe HK of the entire inferior wall. There was severe basal septal hypertrophy with gr 2 diastolic dysfunction. Moderate MR, mild AS and mild Aortic root enlargement. We started him on losartan 25 mg daily but dose had to be reduced to 12.5 mg due to hypotension. He was seen in September with worsening CHF with weight gain, edema and worsening fatigue. Lasix dose was increased. Echo showed further decline in LV function with EF 30-35%. AS was felt to be moderate to severe ( low flow AS). Now seen today to discuss.   He is seen with his wife today. His edema is getting worse. He has gained 11 lbs in the last 3 weeks. He complains of dyspnea worse in the am. He is totally exhausted. Denies any chest pain. Is no longer responding well to oral diuretics. Oxygen sats at home have been OK. He has been able to get back on losartan at 25 mg daily.    Past Medical History:  Diagnosis Date  . Arthritis   . Colitis   . Edema   . Essential hypertension    no med now  . Essential tremor   . Hyperlipidemia   . Hypotonic bladder    L4-L5  . Obstructive uropathy   . Peripheral neuropathy    Elevated by Dr. love 2010  . Prostate cancer ([facility]) 2005    Past Surgical History:  Procedure Laterality Date  . ACHILLES TENDON SURGERY Bilateral 1995  . COLONOSCOPY     With polyp resection  . REVERSE SHOULDER  ARTHROPLASTY Right 06/04/2016  . REVERSE SHOULDER ARTHROPLASTY Right 06/04/2016   Procedure: REVERSE SHOULDER ARTHROPLASTY;  Surgeon: Netta Cedars, MD;  Location: [facility];  Service: Orthopedics;  Laterality: Right;  . SKIN CANCER EXCISION  1974   On chest wall  . TOTAL KNEE ARTHROPLASTY Right 2011  . TOTAL KNEE ARTHROPLASTY Left 04/09/2013   Procedure: LEFT TOTAL KNEE ARTHROPLASTY;  Surgeon: Gearlean Alf, MD;  Location: [facility];  Service: Orthopedics;  Laterality: Left;     Current Outpatient Medications  Medication Sig Dispense Refill  . aspirin 81 MG tablet Take 81 mg by mouth at bedtime.     . dutasteride (AVODART) 0.5 MG capsule Take 0.5 mg by mouth daily.    . folic acid (FOLVITE) 035 MCG tablet Take 800 mcg by mouth daily.     . furosemide (LASIX) 40 MG tablet Take 1 tablet (40 mg total) by mouth 2 (two) times daily. 60 tablet 3  . losartan (COZAAR) 25 MG tablet Take 25 mg by mouth daily.    . melatonin 5 MG TABS Take 5 mg by mouth at bedtime as needed.    . Multiple Vitamins-Minerals (CENTRUM SILVER PO) Take 1 tablet by mouth daily.     Marland Kitchen sulfaSALAzine (AZULFIDINE) 500 MG tablet Take 2,000 mg by mouth daily.     . tamsulosin (FLOMAX) 0.4 MG CAPS Take 0.4 mg by mouth 2 (two) times daily.      No current facility-administered medications for this visit.    Allergies:   Simvastatin    Social History:  The patient  reports that he has never smoked. He has never used smokeless tobacco. He reports current alcohol use of about 1.0 standard drink of alcohol per week. He reports that he does not use drugs.   Family History:  The patient's family history includes Diabetes in his mother; Heart attack in his brother and maternal aunt; Heart failure in his mother; Hypertension in his mother.    ROS:  Please see the history of present illness.   Otherwise, review of systems are positive for none.   All other systems are reviewed and negative.    PHYSICAL EXAM: VS:  BP 108/82   Pulse 90    Ht 5' 9"  (1.753 m)   Wt 193 lb 6.4 oz (87.7 kg)   SpO2 97%   BMI 28.56 kg/m  , BMI Body mass index is 28.56 kg/m. GEN: elderly WM, well developed, in no acute distress  HEENT: normal  Neck: + JVD to 10 cm, no carotid bruits, or masses Cardiac: RRR; gr 2/6 systolic murmur at the RUSB. No  rubs, or gallops.  Respiratory:  Right basilar rales.  normal work of breathing GI: soft, nontender, nondistended, + BS MS: no deformity or atrophy  Skin: warm and dry, no rash Ext: 3+ LE edema to level of thighs. Neuro:  Strength and sensation are intact Psych: euthymic mood, full affect   EKG:  EKG is not ordered today. The ekg ordered today demonstrates N/A   Recent Labs: 11/19/2019: BNP 1,139.7; Magnesium 2.3 11/25/2019: Hemoglobin 13.2; Platelets 147 01/02/2020: ALT 16; Pro B Natriuretic peptide (BNP) 1,167.0 04/28/2020: BUN 17; Creatinine, Ser 1.45; Potassium 4.3; Sodium 137    Lipid Panel No results found for: CHOL, TRIG, HDL, CHOLHDL, VLDL, LDLCALC, LDLDIRECT    Wt Readings from Last 3 Encounters:  05/15/20 193 lb 6.4 oz (87.7 kg)  04/22/20 182 lb (82.6 kg)  03/17/20 178 lb 6.4 oz (80.9 kg)    - 10/12/2019: WBC 7.6, Hgb 14.2, Plts 246. Na 140, K 4.4, Glucose 103, BUN 12, Cr 1.26. AST 29, ALT 21, Alk Phos 61, Total Bili 0.5. TSH 0.849. UA showed 11m of protein but was otherwise unremarkable.  - 10/26/2019: Cr 1.20.  - 11/14/2019: BNP 1,380. Na 140, K 4.6, Glucose 107, BUN 22, Cr 1.16. AST 26, ALT, Alk Phos 59. Dated 12/06/19: BUN 27, creatinine 1.4. CBC and CMET normal.  Other studies Reviewed: Additional studies/ records that were reviewed today include:  Echo 12/10/19: IMPRESSIONS    1. Left ventricular ejection fraction, by estimation, is 40 to 45%. The  left ventricle has mildly decreased function. The left ventricle  demonstrates global hypokinesis. There is severe left ventricular  hypertrophy of the basal-septal segment. Left  ventricular diastolic parameters are  consistent with Grade II diastolic  dysfunction (pseudonormalization). There is severe hypokinesis of the left  ventricular, entire inferior wall and inferoseptal wall.  2. Right ventricular systolic function is normal. The right ventricular  size is mildly enlarged. There is normal pulmonary artery systolic  pressure. The estimated right ventricular systolic pressure is 321.1mmHg.  3. Left atrial size was mild to moderately dilated.  4. The mitral valve is normal in structure. Moderate mitral valve  regurgitation. No evidence of mitral stenosis.  5. The aortic valve is tricuspid. Aortic valve regurgitation is not  visualized. Mild aortic valve stenosis. Aortic valve area, by VTI measures  1.51 cm. Aortic valve mean gradient measures 20.0 mmHg. Aortic valve Vmax  measures 2.46 m/s. The degree of AS  may be underestimated in the setting of LV dysfunction. rree-=--  6. Aortic dilatation noted. There is mild dilatation of the aortic root  and of the ascending aorta measuring 42 mm and 411mrespectively.  7. The inferior vena cava is dilated in size with >50% respiratory  variability, suggesting right atrial pressure of 8 mmHg.   Echo 04/01/20: IMPRESSIONS    1. LVEF has decreased from 12/10/2019, now 30-35% with LV dyssynchrony.  Aortic stenosis is most probably severe with low flow low gradient,  dimensionless index 0.26.  2. Left ventricular ejection fraction, by estimation, is 30 to 35%. The  left ventricle has moderately decreased function. The left ventricle  demonstrates global hypokinesis. The left ventricular internal cavity size  was mildly dilated. There is severe  asymmetric left ventricular hypertrophy of the basal-septal segment. Left  ventricular diastolic function could not be evaluated.  3. Right ventricular systolic function is mildly reduced. The right  ventricular size is mildly enlarged. There is moderately elevated  pulmonary artery systolic pressure. The  estimated right ventricular  systolic pressure is 5594.1mHg.  4. Left atrial size was severely dilated.  5. Right atrial size was moderately dilated.  6. The mitral valve is normal in structure. Moderate mitral valve  regurgitation. No evidence of mitral stenosis.  7. The aortic valve is normal in structure. Aortic valve regurgitation is  not  visualized. Moderate to severe aortic valve stenosis. Aortic valve  mean gradient measures 24.0 mmHg.  8. Aortic dilatation noted. There is mild to moderate dilatation of the  ascending aorta, measuring 44 mm.  9. The inferior vena cava is dilated in size with <50% respiratory  variability, suggesting right atrial pressure of 15 mmHg.   ASSESSMENT AND PLAN:  1. Acute on chronic combined systolic/diastolic CHF. EF 30-35%.  - CT scan on 11/15/2019 showed mild cardiomegaly with moderate right pleural effusion and adjacent atelectasis or pneumonia in the right lower and middle lobes.   -BNP elevated at 1,380  11/14/2019. Started on Lasix 22m daily with initial improvement but now with progressive CHF with worsening edema, weight gain and dyspnea. 3+ edema on exam  - suspect worsening CHF is related to severe low flow AS and well as possible ischemia with regional Wall motion abnormality noted on Echo.  - I have recommended hospitalization for IV diuresis. He has failed outpatient therapy. Will also need invasive evaluation with right and left heart cath to assess hemodynamics.    2. Severe low flow AS.  - will need evaluation with Cardiac cath to see if he is a candidate for TAVR.   3. PVCs - Patient with frequent PVCs. -  potassium and Magnesium OK. - Will hold off on starting beta-blocker with decompensated CHF  4.  Chronic Venous Insufficiency - Patient hashistorychronic lower extremity edema (left > right) which has been felt to be due to venous insufficiencyin the past. - Continue Lasix as above. - Continue conservative  management with elevation of feet, compression stockings, and sodium restriction.  5. CAD with coronary artery calcification. Regional wall motion abnormality on Echo.  - will assess with cardiac cath once CHF tuned up.  6.  Hyperlipidemia - [facility] Total Cholesterol 203, Triglycerides 171, HDL 63, LDL 111.  - Given coronary artery calcification on  CT scan, would ideally be on statin. Looks like he has been on Simvastatin in the past but did not tolerate this well due to neuropathy. Consider low dose  Crestor.   7. CKD. Stage 3a     Current medicines are reviewed at length with the patient today.  The patient does not have concerns regarding medicines.  The following changes have been made:  See above  Labs/ tests ordered today include:  No orders of the defined types were placed in this encounter.    Disposition: see above.   Signed, Faruq Rosenberger JMartinique MD  05/15/2020 5:10 PM    [facility] [address] [place] [address]Phone [phone] Fax [phone]

## 2020-05-14 ENCOUNTER — Ambulatory Visit: Payer: Medicare HMO | Admitting: Cardiology

## 2020-05-15 ENCOUNTER — Encounter: Payer: Self-pay | Admitting: Cardiology

## 2020-05-15 ENCOUNTER — Ambulatory Visit (INDEPENDENT_AMBULATORY_CARE_PROVIDER_SITE_OTHER): Payer: Medicare HMO | Admitting: Cardiology

## 2020-05-15 VITALS — BP 108/82 | HR 90 | Ht 69.0 in | Wt 193.4 lb

## 2020-05-15 DIAGNOSIS — I5043 Acute on chronic combined systolic (congestive) and diastolic (congestive) heart failure: Secondary | ICD-10-CM | POA: Diagnosis not present

## 2020-05-15 DIAGNOSIS — I350 Nonrheumatic aortic (valve) stenosis: Secondary | ICD-10-CM | POA: Diagnosis not present

## 2020-05-15 DIAGNOSIS — N1831 Chronic kidney disease, stage 3a: Secondary | ICD-10-CM

## 2020-05-15 DIAGNOSIS — E785 Hyperlipidemia, unspecified: Secondary | ICD-10-CM | POA: Diagnosis not present

## 2020-05-15 DIAGNOSIS — I872 Venous insufficiency (chronic) (peripheral): Secondary | ICD-10-CM

## 2020-05-15 DIAGNOSIS — I2510 Atherosclerotic heart disease of native coronary artery without angina pectoris: Secondary | ICD-10-CM

## 2020-05-15 DIAGNOSIS — I10 Essential (primary) hypertension: Secondary | ICD-10-CM

## 2020-05-16 ENCOUNTER — Telehealth: Payer: Self-pay | Admitting: Cardiology

## 2020-05-16 ENCOUNTER — Encounter: Payer: Self-pay | Admitting: Cardiology

## 2020-05-16 ENCOUNTER — Inpatient Hospital Stay (HOSPITAL_COMMUNITY)
Admission: RE | Admit: 2020-05-16 | Discharge: 2020-06-26 | DRG: 216 | Disposition: A | Discharge status: Skilled Nursing Facility | Payer: Medicare HMO | Attending: Surgery | Admitting: Surgery

## 2020-05-16 ENCOUNTER — Other Ambulatory Visit: Payer: Self-pay

## 2020-05-16 DIAGNOSIS — I4940 Unspecified premature depolarization: Secondary | ICD-10-CM | POA: Diagnosis present

## 2020-05-16 DIAGNOSIS — Z4659 Encounter for fitting and adjustment of other gastrointestinal appliance and device: Secondary | ICD-10-CM

## 2020-05-16 DIAGNOSIS — R52 Pain, unspecified: Secondary | ICD-10-CM | POA: Diagnosis not present

## 2020-05-16 DIAGNOSIS — Z01818 Encounter for other preprocedural examination: Secondary | ICD-10-CM

## 2020-05-16 DIAGNOSIS — I5021 Acute systolic (congestive) heart failure: Secondary | ICD-10-CM | POA: Diagnosis not present

## 2020-05-16 DIAGNOSIS — R0602 Shortness of breath: Secondary | ICD-10-CM | POA: Diagnosis not present

## 2020-05-16 DIAGNOSIS — D62 Acute posthemorrhagic anemia: Secondary | ICD-10-CM | POA: Diagnosis not present

## 2020-05-16 DIAGNOSIS — I130 Hypertensive heart and chronic kidney disease with heart failure and stage 1 through stage 4 chronic kidney disease, or unspecified chronic kidney disease: Secondary | ICD-10-CM | POA: Diagnosis present

## 2020-05-16 DIAGNOSIS — D72829 Elevated white blood cell count, unspecified: Secondary | ICD-10-CM

## 2020-05-16 DIAGNOSIS — L89152 Pressure ulcer of sacral region, stage 2: Secondary | ICD-10-CM | POA: Diagnosis present

## 2020-05-16 DIAGNOSIS — N179 Acute kidney failure, unspecified: Secondary | ICD-10-CM | POA: Diagnosis not present

## 2020-05-16 DIAGNOSIS — I441 Atrioventricular block, second degree: Secondary | ICD-10-CM | POA: Diagnosis present

## 2020-05-16 DIAGNOSIS — Z9889 Other specified postprocedural states: Secondary | ICD-10-CM

## 2020-05-16 DIAGNOSIS — E873 Alkalosis: Secondary | ICD-10-CM | POA: Diagnosis not present

## 2020-05-16 DIAGNOSIS — Z743 Need for continuous supervision: Secondary | ICD-10-CM | POA: Diagnosis not present

## 2020-05-16 DIAGNOSIS — I509 Heart failure, unspecified: Secondary | ICD-10-CM

## 2020-05-16 DIAGNOSIS — Z8249 Family history of ischemic heart disease and other diseases of the circulatory system: Secondary | ICD-10-CM

## 2020-05-16 DIAGNOSIS — I25110 Atherosclerotic heart disease of native coronary artery with unstable angina pectoris: Secondary | ICD-10-CM | POA: Diagnosis not present

## 2020-05-16 DIAGNOSIS — Z96611 Presence of right artificial shoulder joint: Secondary | ICD-10-CM | POA: Diagnosis present

## 2020-05-16 DIAGNOSIS — Z8701 Personal history of pneumonia (recurrent): Secondary | ICD-10-CM

## 2020-05-16 DIAGNOSIS — Z79899 Other long term (current) drug therapy: Secondary | ICD-10-CM

## 2020-05-16 DIAGNOSIS — I2582 Chronic total occlusion of coronary artery: Secondary | ICD-10-CM | POA: Diagnosis present

## 2020-05-16 DIAGNOSIS — R0902 Hypoxemia: Secondary | ICD-10-CM

## 2020-05-16 DIAGNOSIS — E871 Hypo-osmolality and hyponatremia: Secondary | ICD-10-CM | POA: Diagnosis not present

## 2020-05-16 DIAGNOSIS — N183 Chronic kidney disease, stage 3 unspecified: Secondary | ICD-10-CM | POA: Diagnosis not present

## 2020-05-16 DIAGNOSIS — Z951 Presence of aortocoronary bypass graft: Secondary | ICD-10-CM | POA: Diagnosis not present

## 2020-05-16 DIAGNOSIS — I5023 Acute on chronic systolic (congestive) heart failure: Secondary | ICD-10-CM | POA: Diagnosis not present

## 2020-05-16 DIAGNOSIS — J9811 Atelectasis: Secondary | ICD-10-CM | POA: Diagnosis not present

## 2020-05-16 DIAGNOSIS — F419 Anxiety disorder, unspecified: Secondary | ICD-10-CM | POA: Diagnosis present

## 2020-05-16 DIAGNOSIS — R338 Other retention of urine: Secondary | ICD-10-CM | POA: Diagnosis present

## 2020-05-16 DIAGNOSIS — N401 Enlarged prostate with lower urinary tract symptoms: Secondary | ICD-10-CM | POA: Diagnosis present

## 2020-05-16 DIAGNOSIS — Z20822 Contact with and (suspected) exposure to covid-19: Secondary | ICD-10-CM | POA: Diagnosis not present

## 2020-05-16 DIAGNOSIS — D689 Coagulation defect, unspecified: Secondary | ICD-10-CM | POA: Diagnosis not present

## 2020-05-16 DIAGNOSIS — I2510 Atherosclerotic heart disease of native coronary artery without angina pectoris: Secondary | ICD-10-CM | POA: Diagnosis not present

## 2020-05-16 DIAGNOSIS — G4700 Insomnia, unspecified: Secondary | ICD-10-CM | POA: Diagnosis present

## 2020-05-16 DIAGNOSIS — F32A Depression, unspecified: Secondary | ICD-10-CM | POA: Diagnosis present

## 2020-05-16 DIAGNOSIS — I350 Nonrheumatic aortic (valve) stenosis: Secondary | ICD-10-CM

## 2020-05-16 DIAGNOSIS — N184 Chronic kidney disease, stage 4 (severe): Secondary | ICD-10-CM | POA: Diagnosis not present

## 2020-05-16 DIAGNOSIS — L8990 Pressure ulcer of unspecified site, unspecified stage: Secondary | ICD-10-CM | POA: Insufficient documentation

## 2020-05-16 DIAGNOSIS — I5043 Acute on chronic combined systolic (congestive) and diastolic (congestive) heart failure: Secondary | ICD-10-CM | POA: Diagnosis present

## 2020-05-16 DIAGNOSIS — E440 Moderate protein-calorie malnutrition: Secondary | ICD-10-CM | POA: Insufficient documentation

## 2020-05-16 DIAGNOSIS — I2729 Other secondary pulmonary hypertension: Secondary | ICD-10-CM | POA: Diagnosis present

## 2020-05-16 DIAGNOSIS — R5381 Other malaise: Secondary | ICD-10-CM | POA: Diagnosis present

## 2020-05-16 DIAGNOSIS — Z95828 Presence of other vascular implants and grafts: Secondary | ICD-10-CM

## 2020-05-16 DIAGNOSIS — I454 Nonspecific intraventricular block: Secondary | ICD-10-CM | POA: Diagnosis present

## 2020-05-16 DIAGNOSIS — R69 Illness, unspecified: Secondary | ICD-10-CM | POA: Diagnosis not present

## 2020-05-16 DIAGNOSIS — I5042 Chronic combined systolic (congestive) and diastolic (congestive) heart failure: Secondary | ICD-10-CM | POA: Diagnosis present

## 2020-05-16 DIAGNOSIS — I4891 Unspecified atrial fibrillation: Secondary | ICD-10-CM | POA: Diagnosis not present

## 2020-05-16 DIAGNOSIS — Z96653 Presence of artificial knee joint, bilateral: Secondary | ICD-10-CM | POA: Diagnosis present

## 2020-05-16 DIAGNOSIS — I340 Nonrheumatic mitral (valve) insufficiency: Secondary | ICD-10-CM | POA: Diagnosis not present

## 2020-05-16 DIAGNOSIS — R531 Weakness: Secondary | ICD-10-CM | POA: Diagnosis not present

## 2020-05-16 DIAGNOSIS — Z85828 Personal history of other malignant neoplasm of skin: Secondary | ICD-10-CM

## 2020-05-16 DIAGNOSIS — M1990 Unspecified osteoarthritis, unspecified site: Secondary | ICD-10-CM | POA: Diagnosis present

## 2020-05-16 DIAGNOSIS — Z515 Encounter for palliative care: Secondary | ICD-10-CM | POA: Diagnosis not present

## 2020-05-16 DIAGNOSIS — M47816 Spondylosis without myelopathy or radiculopathy, lumbar region: Secondary | ICD-10-CM | POA: Diagnosis not present

## 2020-05-16 DIAGNOSIS — I700 Atherosclerosis of aorta: Secondary | ICD-10-CM | POA: Diagnosis present

## 2020-05-16 DIAGNOSIS — J90 Pleural effusion, not elsewhere classified: Secondary | ICD-10-CM

## 2020-05-16 DIAGNOSIS — R682 Dry mouth, unspecified: Secondary | ICD-10-CM | POA: Diagnosis present

## 2020-05-16 DIAGNOSIS — Z7982 Long term (current) use of aspirin: Secondary | ICD-10-CM

## 2020-05-16 DIAGNOSIS — Z8546 Personal history of malignant neoplasm of prostate: Secondary | ICD-10-CM

## 2020-05-16 DIAGNOSIS — I083 Combined rheumatic disorders of mitral, aortic and tricuspid valves: Secondary | ICD-10-CM | POA: Diagnosis not present

## 2020-05-16 DIAGNOSIS — J189 Pneumonia, unspecified organism: Secondary | ICD-10-CM | POA: Diagnosis not present

## 2020-05-16 DIAGNOSIS — E785 Hyperlipidemia, unspecified: Secondary | ICD-10-CM | POA: Diagnosis present

## 2020-05-16 DIAGNOSIS — I25118 Atherosclerotic heart disease of native coronary artery with other forms of angina pectoris: Secondary | ICD-10-CM | POA: Diagnosis not present

## 2020-05-16 DIAGNOSIS — Z09 Encounter for follow-up examination after completed treatment for conditions other than malignant neoplasm: Secondary | ICD-10-CM

## 2020-05-16 DIAGNOSIS — Z9689 Presence of other specified functional implants: Secondary | ICD-10-CM

## 2020-05-16 DIAGNOSIS — R21 Rash and other nonspecific skin eruption: Secondary | ICD-10-CM | POA: Diagnosis not present

## 2020-05-16 DIAGNOSIS — Z419 Encounter for procedure for purposes other than remedying health state, unspecified: Secondary | ICD-10-CM

## 2020-05-16 DIAGNOSIS — I080 Rheumatic disorders of both mitral and aortic valves: Secondary | ICD-10-CM | POA: Diagnosis not present

## 2020-05-16 DIAGNOSIS — J811 Chronic pulmonary edema: Secondary | ICD-10-CM | POA: Diagnosis not present

## 2020-05-16 DIAGNOSIS — I5082 Biventricular heart failure: Secondary | ICD-10-CM | POA: Diagnosis present

## 2020-05-16 DIAGNOSIS — Z952 Presence of prosthetic heart valve: Secondary | ICD-10-CM

## 2020-05-16 DIAGNOSIS — Z888 Allergy status to other drugs, medicaments and biological substances status: Secondary | ICD-10-CM

## 2020-05-16 DIAGNOSIS — Z833 Family history of diabetes mellitus: Secondary | ICD-10-CM

## 2020-05-16 DIAGNOSIS — F5102 Adjustment insomnia: Secondary | ICD-10-CM | POA: Diagnosis not present

## 2020-05-16 DIAGNOSIS — Z01810 Encounter for preprocedural cardiovascular examination: Secondary | ICD-10-CM | POA: Diagnosis not present

## 2020-05-16 DIAGNOSIS — N1832 Chronic kidney disease, stage 3b: Secondary | ICD-10-CM | POA: Diagnosis not present

## 2020-05-16 DIAGNOSIS — N312 Flaccid neuropathic bladder, not elsewhere classified: Secondary | ICD-10-CM | POA: Diagnosis present

## 2020-05-16 DIAGNOSIS — R578 Other shock: Secondary | ICD-10-CM | POA: Diagnosis not present

## 2020-05-16 DIAGNOSIS — I517 Cardiomegaly: Secondary | ICD-10-CM | POA: Diagnosis not present

## 2020-05-16 DIAGNOSIS — K5900 Constipation, unspecified: Secondary | ICD-10-CM | POA: Diagnosis present

## 2020-05-16 DIAGNOSIS — I4892 Unspecified atrial flutter: Secondary | ICD-10-CM | POA: Diagnosis not present

## 2020-05-16 DIAGNOSIS — R279 Unspecified lack of coordination: Secondary | ICD-10-CM | POA: Diagnosis not present

## 2020-05-16 DIAGNOSIS — N139 Obstructive and reflux uropathy, unspecified: Secondary | ICD-10-CM | POA: Diagnosis present

## 2020-05-16 DIAGNOSIS — Z4682 Encounter for fitting and adjustment of non-vascular catheter: Secondary | ICD-10-CM | POA: Diagnosis not present

## 2020-05-16 DIAGNOSIS — E878 Other disorders of electrolyte and fluid balance, not elsewhere classified: Secondary | ICD-10-CM | POA: Diagnosis not present

## 2020-05-16 DIAGNOSIS — R627 Adult failure to thrive: Secondary | ICD-10-CM | POA: Diagnosis present

## 2020-05-16 DIAGNOSIS — G250 Essential tremor: Secondary | ICD-10-CM | POA: Diagnosis present

## 2020-05-16 DIAGNOSIS — K6389 Other specified diseases of intestine: Secondary | ICD-10-CM | POA: Diagnosis not present

## 2020-05-16 DIAGNOSIS — J80 Acute respiratory distress syndrome: Secondary | ICD-10-CM | POA: Diagnosis not present

## 2020-05-16 DIAGNOSIS — I358 Other nonrheumatic aortic valve disorders: Secondary | ICD-10-CM | POA: Diagnosis not present

## 2020-05-16 DIAGNOSIS — G629 Polyneuropathy, unspecified: Secondary | ICD-10-CM | POA: Diagnosis present

## 2020-05-16 HISTORY — DX: Atherosclerotic heart disease of native coronary artery without angina pectoris: I25.10

## 2020-05-16 HISTORY — DX: Nonrheumatic aortic (valve) stenosis: I35.0

## 2020-05-16 LAB — RESPIRATORY PANEL BY RT PCR (FLU A&B, COVID)
Influenza A by PCR: NEGATIVE
Influenza B by PCR: NEGATIVE
SARS Coronavirus 2 by RT PCR: NEGATIVE

## 2020-05-16 LAB — COMPREHENSIVE METABOLIC PANEL
ALT: 25 U/L (ref 0–44)
AST: 39 U/L (ref 15–41)
Albumin: 3.5 g/dL (ref 3.5–5.0)
Alkaline Phosphatase: 67 U/L (ref 38–126)
Anion gap: 11 (ref 5–15)
BUN: 26 mg/dL — ABNORMAL HIGH (ref 8–23)
CO2: 26 mmol/L (ref 22–32)
Calcium: 9.2 mg/dL (ref 8.9–10.3)
Chloride: 100 mmol/L (ref 98–111)
Creatinine, Ser: 1.39 mg/dL — ABNORMAL HIGH (ref 0.61–1.24)
GFR, Estimated: 48 mL/min — ABNORMAL LOW (ref >60)
Glucose, Bld: 101 mg/dL — ABNORMAL HIGH (ref 70–99)
Potassium: 4.1 mmol/L (ref 3.5–5.1)
Sodium: 137 mmol/L (ref 135–145)
Total Bilirubin: 0.5 mg/dL (ref 0.3–1.2)
Total Protein: 6.6 g/dL (ref 6.5–8.1)

## 2020-05-16 LAB — HEMOGLOBIN A1C
Hgb A1c MFr Bld: 6.2 % — ABNORMAL HIGH (ref 4.8–5.6)
Mean Plasma Glucose: 131.24 mg/dL

## 2020-05-16 LAB — CBC
HCT: 37.2 % — ABNORMAL LOW (ref 39.0–52.0)
Hemoglobin: 12.3 g/dL — ABNORMAL LOW (ref 13.0–17.0)
MCH: 31.3 pg (ref 26.0–34.0)
MCHC: 33.1 g/dL (ref 30.0–36.0)
MCV: 94.7 fL (ref 80.0–100.0)
Platelets: 141 10*3/uL — ABNORMAL LOW (ref 150–400)
RBC: 3.93 MIL/uL — ABNORMAL LOW (ref 4.22–5.81)
RDW: 14.1 % (ref 11.5–15.5)
WBC: 5.2 10*3/uL (ref 4.0–10.5)
nRBC: 0 % (ref 0.0–0.2)

## 2020-05-16 LAB — BRAIN NATRIURETIC PEPTIDE: B Natriuretic Peptide: 2005.2 pg/mL — ABNORMAL HIGH (ref 0.0–100.0)

## 2020-05-16 MED ORDER — FUROSEMIDE 10 MG/ML IJ SOLN
80.0000 mg | Freq: Two times a day (BID) | INTRAMUSCULAR | Status: DC
  Administered 2020-05-16 – 2020-05-18 (×4): 80 mg via INTRAVENOUS
  Filled 2020-05-16 (×4): qty 8

## 2020-05-16 MED ORDER — ENOXAPARIN SODIUM 40 MG/0.4ML ~~LOC~~ SOLN
40.0000 mg | SUBCUTANEOUS | Status: DC
  Administered 2020-05-16 – 2020-05-19 (×4): 40 mg via SUBCUTANEOUS
  Filled 2020-05-16 (×4): qty 0.4

## 2020-05-16 MED ORDER — DUTASTERIDE 0.5 MG PO CAPS
0.5000 mg | ORAL_CAPSULE | Freq: Every day | ORAL | Status: DC
  Administered 2020-05-17 – 2020-05-22 (×6): 0.5 mg via ORAL
  Filled 2020-05-16 (×7): qty 1

## 2020-05-16 MED ORDER — ACETAMINOPHEN 325 MG PO TABS: 650.0000 mg | ORAL_TABLET | ORAL | Status: DC | PRN

## 2020-05-16 MED ORDER — ONDANSETRON HCL 4 MG/2ML IJ SOLN: 4.0000 mg | Freq: Four times a day (QID) | INTRAMUSCULAR | Status: DC | PRN

## 2020-05-16 MED ORDER — LOSARTAN POTASSIUM 25 MG PO TABS
25.0000 mg | ORAL_TABLET | Freq: Every day | ORAL | Status: DC
  Filled 2020-05-16: qty 1

## 2020-05-16 MED ORDER — NITROGLYCERIN 0.4 MG SL SUBL: 0.4000 mg | SUBLINGUAL_TABLET | SUBLINGUAL | Status: DC | PRN

## 2020-05-16 MED ORDER — SULFASALAZINE 500 MG PO TABS
2000.0000 mg | ORAL_TABLET | Freq: Every day | ORAL | Status: DC
  Administered 2020-05-17 – 2020-05-22 (×6): 2000 mg via ORAL
  Filled 2020-05-16 (×7): qty 4

## 2020-05-16 MED ORDER — TAMSULOSIN HCL 0.4 MG PO CAPS
0.4000 mg | ORAL_CAPSULE | Freq: Two times a day (BID) | ORAL | Status: DC
  Administered 2020-05-16 – 2020-05-25 (×17): 0.4 mg via ORAL
  Filled 2020-05-16 (×17): qty 1

## 2020-05-16 MED ORDER — MELATONIN 5 MG PO TABS
5.0000 mg | ORAL_TABLET | Freq: Every evening | ORAL | Status: DC | PRN
  Administered 2020-05-17 – 2020-05-19 (×3): 5 mg via ORAL
  Filled 2020-05-16 (×3): qty 1

## 2020-05-16 MED ORDER — ASPIRIN EC 81 MG PO TBEC
81.0000 mg | DELAYED_RELEASE_TABLET | Freq: Every day | ORAL | Status: DC
  Administered 2020-05-17 – 2020-05-22 (×6): 81 mg via ORAL
  Filled 2020-05-16 (×6): qty 1

## 2020-05-16 NOTE — Telephone Encounter (Signed)
Spoke with patients wife who states that patient was seen yesterday in office by Dr. Martinique. Patient states that Dr. Martinique wanted patient to be admitted to [facility] after appointment yesterday and stated that they would get a call about a bed but wife stated that they have not received a call yet and wanted to check on the status.   Called and spoke with patient placement regarding patients admission, per patient placement patient is to be admitted to [facility] room 13. Per patient placement they are going to call patient right now with instructions for patient to report to admitting department of hospital.   Will follow up to see if patient received instructions on bed placement for admission.

## 2020-05-16 NOTE — Progress Notes (Signed)
Received patient to room 13 on 3E at approximately 1545. Patient is alert and oriented x 4 with no evidence of memory impairment. He wears glasses that will stay at bedside and hearing aids that are at home with wife. He has good dentition and has no difficulty eating. He does report a decrease in appetite over the last few weeks since edema has been an issue. Respirations are even and unlabored at rest but patient reports and exhibits dyspnea with activity. Lung sounds are clear but diminished in the bilateral lower lobes. Bowel sounds are active in all quads and patient reports last BM to be this AM 05/16/20. He states that he takes stool softeners daily and has no trouble with constipation. Skin assessment revealed nonblancheable are to entire sacrum with a very small open area (see assessment flowsheet for measurements). Area would benefit from barrier cream vs. Mepilex, as patient is not incontinent; he self caths twice daily due to chronic urinary obstruction. Patient also has thickened, flaky skin in buttock folds for which barrier cream would also be effective. Bilateral lower extremities are 4+ pitting edema making it difficult to palpate dorsal and tibial pulses. Toes are clean and without breakdown in between. Patient and spouse have been educated about visiting policies and have been oriented to room and plan of care. Peripheral IV inserted to right forearm on first try. Secured with opsite and tape. Assisted patient in ordering dinner meal. Call light in reach, will monitor.

## 2020-05-16 NOTE — Telephone Encounter (Signed)
Patient's wife states the patient was supposed to receive a call from someone today in regards to his procedure in the hospital.

## 2020-05-16 NOTE — H&P (Addendum)
Patient was seen in the office yesterday by Dr. Martinique with signs of volume overloaded with worsening lower extremity edema, abdominal distension, and 11lb weight gain in the last 3 weeks. He noted worsening dyspnea in the morning. He states he has been sleeping on a flat surface but states he has not been sleeping well and has been waking up in the morning feeling very short of breath. No palpitations. He notes occasional lightheadedness/dizziness but no syncope. No recent fevers or illnesses.  No abnormal bleeding in urine or stools. Patient was felt to have acute on chronic combined CHF felt to likely be due to severe low flow aortic stenosis and possible ischemia given regional wall motion abnormality on Echo in 03/2020.  Plan was to directly admit him to the hospital for IV diuresis. Given bed availability, patient arrived today.   General: 86 y.o. male resting comfortably in no acute distress. HEENT: Normocephalic and atraumatic. Sclera clear.  Neck: Supple. No carotid bruits. JVD elevated. Heart: RRR. Distinct S1 and S2. II/VI systolic murmur at right upper sternal border. No gallops or rubs. Radial pulses 2+ and equal bilaterally. Lungs: No increased work of breathing. Clear to ausculation bilaterally. No significant wheezes, rhonchi, or rales.  Abdomen: Soft, non-distended, and non-tender to palpation. Bowel sounds present in all 4 quadrants.  Extremities: 3+ pitting edema of bilateral lower extremities.  Skin: Warm and dry. Neuro: Alert and oriented x3. No focal deficits. Psych: Normal affect. Responds appropriately.  Plan: - Will check routine labs (CBC, CMET, BNP).  - Will check EKG to chest x-ray. - Will start IV Lasix 50m twice daily.  - Will continue home medications: Aspirin 883mdaily, Dutasteride 0.53m753maily, Sulfasalazine 2,000m56mily, and Flomax 0.4mg 80mce daily. - No beta-blocker for now given decompensated heart failure. - Will need right/left cardiac catheterization  to assess hemodynamics and for TAVR work-up. - Of note, patient self caths himself twice per day. Ok for him to continue this during admission.  Please see Dr. JordaDoug Sou from yesterday below which will serve as full H&P.  [name] [name] 05/16/2020 4:32 PM       Cardiology Office Note   Date:  05/15/2020   ID:  [name] HJakson [name] [birth year] [id number]:  [name] [name]           Cardiologist:   [name] [name]     Chief Complaint  Patient presents with  . Congestive Heart Failure  . Aortic Stenosis      History of Present Illness: [name] [name] 89 y.56 male who is seen for follow up CHF, progressive AS. He hasa history of venous insufficiency with chronic lower extremity edema (mostly on the left), hypertension, hyperlipidemia, peripheral neuropathy, essential tremor, and prostate cancer.  Patient first seen in 09/2012 for evaluation of dyspnea on exertion and lower extremity edema, especially in his left leg. Venous dopplers were ordered and showed no DVT. Echo showed LVEF of 55-60% with moderate asymmetric basal septal hypertrophy with no LV outflow tract gradient and mild to moderate MR. He wasseen in 07/2017 at which time he was doing well. He denied any chest pain or shortness of breath. He continued to report some lower extremity edema that resolved with elevation of legs at night. Edema felt to be due to venous insufficiency and conservative management with sodium restriction, elevation of legs, and compression stockings was recommended.   He wasdiagnosed with right middle lobe pneumonia on 10/12/2019 after presenting for  further evaluation of fatigue while on vacation at the beach.. He tested negative for COVID. He was started on Levaquin but this had to be stopped when he developed tendonitis. He was started on Azithromycin instead on 10/26/2019. Repeat chest x-ray on 10/26/2019 showed increased density of right lower lobe pneumonia.He  had persistent fatigueand developed significant lower extremity edema and weight gain. Patient was seen by PCP again on 11/14/2019. Repeat chest x-ray showed continued right basilar pneumonia and increased right pleural effusion. BNP was checked and came back elevated at 1,380. He was started on Lasix 80m daily. Chest CT was also ordered to rule out underlying lung mass. CT was performed on5/20/2021andshowed mild cardiomegaly, moderate right pleural effusion with adjacent atelectasis or pneumonia in the right lower and middle lobes as well as coronary artery calcifications.Of note, prior to being started on Lasix, he was seen by Urology for urinary retention and had a catheter placed.He was taking lasix 80 mg dailybut this has been reduced to 40 mg.Foley catheter was removed and he is now in and out cathing himself every other day.  He had an Echo done in June showing EF 40-45% with severe HK of the entire inferior wall. There was severe basal septal hypertrophy with gr 2 diastolic dysfunction. Moderate MR, mild AS and mild Aortic root enlargement. We started him on losartan 25 mg daily but dose had to be reduced to 12.5 mg due to hypotension. He was seen in September with worsening CHF with weight gain, edema and worsening fatigue. Lasix dose was increased. Echo showed further decline in LV function with EF 30-35%. AS was felt to be moderate to severe ( low flow AS). Now seen today to discuss.   He is seen with his wife today. His edema is getting worse. He has gained 11 lbs in the last 3 weeks. He complains of dyspnea worse in the am. He is totally exhausted. Denies any chest pain. Is no longer responding well to oral diuretics. Oxygen sats at home have been OK. He has been able to get back on losartan at 25 mg daily.        Past Medical History:  Diagnosis Date  . Arthritis   . Colitis   . Edema   . Essential hypertension    no med now  . Essential tremor   . Hyperlipidemia    . Hypotonic bladder    L4-L5  . Obstructive uropathy   . Peripheral neuropathy    Elevated by Dr. love 2010  . Prostate cancer ([facility] 2005         Past Surgical History:  Procedure Laterality Date  . ACHILLES TENDON SURGERY Bilateral 1995  . COLONOSCOPY     With polyp resection  . REVERSE SHOULDER ARTHROPLASTY Right 06/04/2016  . REVERSE SHOULDER ARTHROPLASTY Right 06/04/2016   Procedure: REVERSE SHOULDER ARTHROPLASTY;  Surgeon: SNetta Cedars MD;  Location: [facility]  Service: Orthopedics;  Laterality: Right;  . SKIN CANCER EXCISION  1974   On chest wall  . TOTAL KNEE ARTHROPLASTY Right 2011  . TOTAL KNEE ARTHROPLASTY Left 04/09/2013   Procedure: LEFT TOTAL KNEE ARTHROPLASTY;  Surgeon: FGearlean Alf MD;  Location: [facility];  Service: Orthopedics;  Laterality: Left;           Current Outpatient Medications  Medication Sig Dispense Refill  . aspirin 81 MG tablet Take 81 mg by mouth at bedtime.     . dutasteride (AVODART) 0.5 MG capsule Take 0.5 mg by mouth daily.    .Marland Kitchen  folic acid (FOLVITE) 948 MCG tablet Take 800 mcg by mouth daily.     . furosemide (LASIX) 40 MG tablet Take 1 tablet (40 mg total) by mouth 2 (two) times daily. 60 tablet 3  . losartan (COZAAR) 25 MG tablet Take 25 mg by mouth daily.    . melatonin 5 MG TABS Take 5 mg by mouth at bedtime as needed.    . Multiple Vitamins-Minerals (CENTRUM SILVER PO) Take 1 tablet by mouth daily.     Marland Kitchen sulfaSALAzine (AZULFIDINE) 500 MG tablet Take 2,000 mg by mouth daily.     . tamsulosin (FLOMAX) 0.4 MG CAPS Take 0.4 mg by mouth 2 (two) times daily.      No current facility-administered medications for this visit.    Allergies:   Simvastatin    Social History:  The patient  reports that he has never smoked. He has never used smokeless tobacco. He reports current alcohol use of about 1.0 standard drink of alcohol per week. He reports that he does not use drugs.   Family History:  The  patient's family history includes Diabetes in his mother; Heart attack in his brother and maternal aunt; Heart failure in his mother; Hypertension in his mother.    ROS:  Please see the history of present illness.   Otherwise, review of systems are positive for none.   All other systems are reviewed and negative.    PHYSICAL EXAM: VS:  BP 108/82   Pulse 90   Ht 5' 9"  (1.753 m)   Wt 193 lb 6.4 oz (87.7 kg)   SpO2 97%   BMI 28.56 kg/m  , BMI Body mass index is 28.56 kg/m. GEN: elderly WM, well developed, in no acute distress  HEENT: normal  Neck: + JVD to 10 cm, no carotid bruits, or masses Cardiac: RRR; gr 2/6 systolic murmur at the RUSB. No  rubs, or gallops.  Respiratory:  Right basilar rales.  normal work of breathing GI: soft, nontender, nondistended, + BS MS: no deformity or atrophy  Skin: warm and dry, no rash Ext: 3+ LE edema to level of thighs. Neuro:  Strength and sensation are intact Psych: euthymic mood, full affect   EKG:  EKG is not ordered today. The ekg ordered today demonstrates N/A   Recent Labs: 11/19/2019: BNP 1,139.7; Magnesium 2.3 11/25/2019: Hemoglobin 13.2; Platelets 147 01/02/2020: ALT 16; Pro B Natriuretic peptide (BNP) 1,167.0 04/28/2020: BUN 17; Creatinine, Ser 1.45; Potassium 4.3; Sodium 137    Lipid Panel Labs (Brief)  No results found for: CHOL, TRIG, HDL, CHOLHDL, VLDL, LDLCALC, LDLDIRECT         Wt Readings from Last 3 Encounters:  05/15/20 193 lb 6.4 oz (87.7 kg)  04/22/20 182 lb (82.6 kg)  03/17/20 178 lb 6.4 oz (80.9 kg)    - 10/12/2019: WBC 7.6, Hgb 14.2, Plts 246. Na 140, K 4.4, Glucose 103, BUN 12, Cr 1.26. AST 29, ALT 21, Alk Phos 61, Total Bili 0.5. TSH 0.849. UA showed 31m of protein but was otherwise unremarkable.  - 10/26/2019: Cr 1.20.  - 11/14/2019: BNP 1,380. Na 140, K 4.6, Glucose 107, BUN 22, Cr 1.16. AST 26, ALT, Alk Phos 59. Dated 12/06/19: BUN 27, creatinine 1.4. CBC and CMET normal.  Other studies  Reviewed: Additional studies/ records that were reviewed today include:  Echo 12/10/19:IMPRESSIONS    1. Left ventricular ejection fraction, by estimation, is 40 to 45%. The  left ventricle has mildly decreased function. The left ventricle  demonstrates  global hypokinesis. There is severe left ventricular  hypertrophy of the basal-septal segment. Left  ventricular diastolic parameters are consistent with Grade II diastolic  dysfunction (pseudonormalization). There is severe hypokinesis of the left  ventricular, entire inferior wall and inferoseptal wall.  2. Right ventricular systolic function is normal. The right ventricular  size is mildly enlarged. There is normal pulmonary artery systolic  pressure. The estimated right ventricular systolic pressure is 40.9 mmHg.  3. Left atrial size was mild to moderately dilated.  4. The mitral valve is normal in structure. Moderate mitral valve  regurgitation. No evidence of mitral stenosis.  5. The aortic valve is tricuspid. Aortic valve regurgitation is not  visualized. Mild aortic valve stenosis. Aortic valve area, by VTI measures  1.51 cm. Aortic valve mean gradient measures 20.0 mmHg. Aortic valve Vmax  measures 2.46 m/s. The degree of AS  may be underestimated in the setting of LV dysfunction. rree-=--  6. Aortic dilatation noted. There is mild dilatation of the aortic root  and of the ascending aorta measuring 42 mm and 31m respectively.  7. The inferior vena cava is dilated in size with >50% respiratory  variability, suggesting right atrial pressure of 8 mmHg.   Echo 04/01/20: IMPRESSIONS    1. LVEF has decreased from 12/10/2019, now 30-35% with LV dyssynchrony.  Aortic stenosis is most probably severe with low flow low gradient,  dimensionless index 0.26.  2. Left ventricular ejection fraction, by estimation, is 30 to 35%. The  left ventricle has moderately decreased function. The left ventricle  demonstrates global  hypokinesis. The left ventricular internal cavity size  was mildly dilated. There is severe  asymmetric left ventricular hypertrophy of the basal-septal segment. Left  ventricular diastolic function could not be evaluated.  3. Right ventricular systolic function is mildly reduced. The right  ventricular size is mildly enlarged. There is moderately elevated  pulmonary artery systolic pressure. The estimated right ventricular  systolic pressure is 581.1mmHg.  4. Left atrial size was severely dilated.  5. Right atrial size was moderately dilated.  6. The mitral valve is normal in structure. Moderate mitral valve  regurgitation. No evidence of mitral stenosis.  7. The aortic valve is normal in structure. Aortic valve regurgitation is  not visualized. Moderate to severe aortic valve stenosis. Aortic valve  mean gradient measures 24.0 mmHg.  8. Aortic dilatation noted. There is mild to moderate dilatation of the  ascending aorta, measuring 44 mm.  9. The inferior vena cava is dilated in size with <50% respiratory  variability, suggesting right atrial pressure of 15 mmHg.   ASSESSMENT AND PLAN:  1. Acute on chronic combined systolic/diastolic CHF. EF 30-35%. - CT scan on 11/15/2019 showed mild cardiomegaly with moderate right pleural effusion and adjacent atelectasis or pneumonia in the right lower and middle lobes.   -BNP elevated at 1,380  11/14/2019. Started on Lasix 854mdaily with initial improvement but now with progressive CHF with worsening edema, weight gain and dyspnea. 3+ edema on exam  -suspect worsening CHF is related to severe low flow AS and well as possible ischemia with regional Wall motion abnormality noted on Echo.  - I have recommended hospitalization for IV diuresis. He has failed outpatient therapy. Will also need invasive evaluation with right and left heart cath to assess hemodynamics.    2.Severe low flow AS.  - will need evaluation with Cardiac cath to  see if he is a candidate for TAVR.   3.PVCs - Patient with frequent PVCs. -  potassiumand Magnesium OK. - Will hold off on starting beta-blocker with decompensated CHF  4.Chronic Venous Insufficiency - Patient hashistorychronic lower extremity edema (left > right) which has been felt to be due to venous insufficiencyin the past. - Continue Lasix as above. - Continue conservative management with elevation of feet, compression stockings, and sodium restriction.  5.CAD with coronary artery calcification. Regional wall motion abnormality on Echo.  - will assess with cardiac cath once CHF tuned up.  6.  Hyperlipidemia - Lipid panel from 07/2019 (from [facility]): Total Cholesterol 203, Triglycerides 171, HDL 63, LDL 111.  - Given coronary artery calcification on  CT scan, would ideally be on statin. Looks like he has been on Simvastatin in the past but did not tolerate this well due to neuropathy. Consider low dose  Crestor.   7. CKD. Stage 3a     Current medicines are reviewed at length with the patient today.  The patient does not have concerns regarding medicines.  The following changes have been made:  See above  Labs/ tests ordered today include:  No orders of the defined types were placed in this encounter.    Disposition: see above.   Signed, [name] Martinique, MD  05/15/2020 5:10 PM    [facility] [address] [place], [place], [address] Phone [phone], Fax [phone]

## 2020-05-16 NOTE — Progress Notes (Signed)
Patient was educated on IV lasix and its purpose. Since patient self caths, I recommended he do so more frequently than twice daily to avoid discomfort or complication. He verbalized understanding and agreed to do so, bnut he may reminders.

## 2020-05-16 NOTE — Plan of Care (Signed)
  Problem: Education: Goal: Knowledge of disease and its progression will improve 05/16/2020 1947 by Nelia Shi, RN Outcome: Progressing 05/16/2020 1947 by Nelia Shi, RN Outcome: Progressing

## 2020-05-16 NOTE — Telephone Encounter (Signed)
Per Chart review patient currently admitted.

## 2020-05-16 NOTE — Progress Notes (Signed)
Labs stable.  Proceed with diuresis.

## 2020-05-16 NOTE — Plan of Care (Signed)
  Problem: Education: Goal: Knowledge of disease and its progression will improve Outcome: Progressing   

## 2020-05-16 NOTE — Telephone Encounter (Signed)
error 

## 2020-05-17 DIAGNOSIS — I350 Nonrheumatic aortic (valve) stenosis: Secondary | ICD-10-CM | POA: Diagnosis not present

## 2020-05-17 DIAGNOSIS — I5043 Acute on chronic combined systolic (congestive) and diastolic (congestive) heart failure: Secondary | ICD-10-CM | POA: Diagnosis not present

## 2020-05-17 LAB — BASIC METABOLIC PANEL
Anion gap: 11 (ref 5–15)
BUN: 27 mg/dL — ABNORMAL HIGH (ref 8–23)
CO2: 26 mmol/L (ref 22–32)
Calcium: 9 mg/dL (ref 8.9–10.3)
Chloride: 100 mmol/L (ref 98–111)
Creatinine, Ser: 1.43 mg/dL — ABNORMAL HIGH (ref 0.61–1.24)
GFR, Estimated: 47 mL/min — ABNORMAL LOW (ref >60)
Glucose, Bld: 93 mg/dL (ref 70–99)
Potassium: 3.6 mmol/L (ref 3.5–5.1)
Sodium: 137 mmol/L (ref 135–145)

## 2020-05-17 MED ORDER — POTASSIUM CHLORIDE CRYS ER 20 MEQ PO TBCR
40.0000 meq | EXTENDED_RELEASE_TABLET | Freq: Once | ORAL | Status: AC
  Administered 2020-05-17: 40 meq via ORAL
  Filled 2020-05-17: qty 2

## 2020-05-17 NOTE — Progress Notes (Addendum)
  ReDS Clip Diuretic Study Pt study # [id number]  Your patient has been enrolled in the ReDS Clip Diuretic Study  Still showing severe LEE bilaterally. Scr 1.39>1.43. On lasix IV 80 BID currently. Uop -2 L yesterday and -2.6 L so far today.  Changes to prescribed diuretics recommended:  Continue current IV diuretic regimen.   Provider contacted: Dr Marlou Porch Recommendation was accepted by provider.    REDS Clip  READING= 34%  CHEST RULER = 29 Clip Station = C   Orthodema score = 2 Signs/Symptoms Score   Mild edema, no orthopnea 0 No congestion  Moderate edema, no orthopnea 1 Low-grade orthodema/congestion  Severe edema OR orthopnea 2   Moderate edema and orthopnea 3 High-grade orthodema/congestion  Severe edema AND orthopnea 4    Antonietta Jewel, PharmD, BCCCP Clinical Pharmacist  Phone: [phone] 05/17/2020 3:44 PM  Please check AMION for all [facility] phone numbers After 10:00 PM, call [place] [phone]

## 2020-05-17 NOTE — Progress Notes (Signed)
Patient alert and oriented x 4, resps even and unlabored though he gets slightly short of breath with activity; noticeable improvement from last night already. Lung sounds clear but still diminished in bilateral bases. Bowel sounds active in all quads, last BM 05/16/20. Patient is continent of bowel and self caths intermittently, usually twice daily when at hoime. Engouraged patient and assist him to cath more frequently, four times daily at least, since diuresis will increase amount of urine. He agreed but has to be reminded, as he does not like hospital straight caths and spouse has not yet arrived with his home supply. Patient BLE still with 4+ pitting edema. He can ambulate without assist device for short distances- such as to the bathroom- which he does ad lib without difficulty and only slight furniture surfing. He is aware of his limitations and adjusts accordingly. Patient denies any discomfort and is very pleasant and sociable. Call light is in reach, will continue to monitor.

## 2020-05-17 NOTE — Progress Notes (Signed)
Progress Note  Patient Name: [name] [name] Date of Encounter: 05/17/2020  [facility] Cardiologist: Peter Martinique, MD   Subjective   Sitting up in chair.  Severe lower extremity edema.  Breathing minimally labored.  No chest pain.  Had a rough night he states.  Inpatient Medications    Scheduled Meds: . aspirin EC  81 mg Oral QHS  . dutasteride  0.5 mg Oral Daily  . enoxaparin (LOVENOX) injection  40 mg Subcutaneous Q24H  . furosemide  80 mg Intravenous BID  . losartan  25 mg Oral Daily  . sulfaSALAzine  2,000 mg Oral Daily  . tamsulosin  0.4 mg Oral BID   Continuous Infusions:  PRN Meds: acetaminophen, melatonin, nitroGLYCERIN, ondansetron (ZOFRAN) IV   Vital Signs    Vitals:   05/17/20 0055 05/17/20 0102 05/17/20 0447 05/17/20 0753  BP: 109/73  110/84 (!) 116/98  Pulse: 74  86 85  Resp: 17  19 20   Temp: 98.3 F (36.8 C)  97.6 F (36.4 C)   TempSrc: Oral  Oral   SpO2: (!) 89%  92% 97%  Weight:  83 kg      Intake/Output Summary (Last 24 hours) at 05/17/2020 0853 Last data filed at 05/17/2020 0600 Gross per 24 hour  Intake 480 ml  Output 2000 ml  Net -1520 ml   Last 3 Weights 05/17/2020 05/15/2020 04/22/2020  Weight (lbs) 182 lb 14.4 oz 193 lb 6.4 oz 182 lb  Weight (kg) 82.963 kg 87.726 kg 82.555 kg      Telemetry    Sinus rhythm occasional PVCs- Personally Reviewed  ECG    Sinus rhythm- Personally Reviewed  Physical Exam   GEN: No acute distress.  Elderly sitting in chair comfortable Neck: No JVD Cardiac: RRR, no murmurs, rubs, or gallops.  Respiratory: Clear to auscultation bilaterally, mildly decreased at bases. GI: Soft, nontender, non-distended  MS:  Severe 4+ lower extremity edema bilaterally right slightly worse than left ; No deformity. Neuro:  Nonfocal  Psych: Normal affect   Labs    High Sensitivity Troponin:  No results for input(s): TROPONINIHS in the last 720 hours.    Chemistry Recent Labs  Lab 05/16/20 1736  05/17/20 0335  NA 137 137  K 4.1 3.6  CL 100 100  CO2 26 26  GLUCOSE 101* 93  BUN 26* 27*  CREATININE 1.39* 1.43*  CALCIUM 9.2 9.0  PROT 6.6  --   ALBUMIN 3.5  --   AST 39  --   ALT 25  --   ALKPHOS 67  --   BILITOT 0.5  --   GFRNONAA 48* 47*  ANIONGAP 11 11     Hematology Recent Labs  Lab 05/16/20 1736  WBC 5.2  RBC 3.93*  HGB 12.3*  HCT 37.2*  MCV 94.7  MCH 31.3  MCHC 33.1  RDW 14.1  PLT 141*    BNP Recent Labs  Lab 05/16/20 1736  BNP 2,005.2*     DDimer No results for input(s): DDIMER in the last 168 hours.   Radiology    No results found.  Cardiac Studies   ECHO 04/01/20:  1. LVEF has decreased from 12/10/2019, now 30-35% with LV dyssynchrony.  Aortic stenosis is most probably severe with low flow low gradient,  dimensionless index 0.26.  2. Left ventricular ejection fraction, by estimation, is 30 to 35%. The  left ventricle has moderately decreased function. The left ventricle  demonstrates global hypokinesis. The left ventricular internal cavity size  was  mildly dilated. There is severe  asymmetric left ventricular hypertrophy of the basal-septal segment. Left  ventricular diastolic function could not be evaluated.  3. Right ventricular systolic function is mildly reduced. The right  ventricular size is mildly enlarged. There is moderately elevated  pulmonary artery systolic pressure. The estimated right ventricular  systolic pressure is 40.3 mmHg.  4. Left atrial size was severely dilated.  5. Right atrial size was moderately dilated.  6. The mitral valve is normal in structure. Moderate mitral valve  regurgitation. No evidence of mitral stenosis.  7. The aortic valve is normal in structure. Aortic valve regurgitation is  not visualized. Moderate to severe aortic valve stenosis. Aortic valve  mean gradient measures 24.0 mmHg.  8. Aortic dilatation noted. There is mild to moderate dilatation of the  ascending aorta, measuring 44 mm.   9. The inferior vena cava is dilated in size with <50% respiratory  variability, suggesting right atrial pressure of 15 mmHg.   Patient Profile     86 y.o. male acute on chronic systolic heart failure severe low-flow aortic stenosis  Assessment & Plan    Acute on chronic systolic heart failure with severe low-flow aortic stenosis -Dr. Martinique saw in clinic, he was originally started on Lasix 80 mg a day with initial improvement but had progressive heart failure worsening edema and weight gain with 3+ edema on exam. -Multifactorial worsening of heart failure with decreased ejection fraction as well as aortic stenosis playing a role. -Plan is to have continued IV diuresis as he has failed outpatient therapy and to pursue invasive evaluation of right and left heart cath to assess hemodynamics and potential accessibility to structural heart team/TAVR. -Mild hypotension upper 47Q systolic overnight.  We will stop his losartan 25.  CAD/aortic atherosclerosis -Coronary artery calcification noted on CT of chest 11/15/2019.  Regional wall motion abnormality on echo.  Cardiac cath once able. -Consider low-dose Crestor.  Did not tolerate simvastatin in the past.  Severe BPH -Patient self caths.  Secondary pulmonary hypertension -Pressures moderately elevated 55 mmHg PA systolic.  Secondary to left heart failure.  Chronic kidney disease stage IV -Creatinine 1.43-relatively stable from yesterday with diuresis of 2 L out.  Continue to keep a close watch on this.  Will replete potassium.  For questions or updates, please contact [facility] Please consult [email or web address] for contact info under        Signed, Candee Furbish, MD  05/17/2020, 8:53 AM

## 2020-05-17 NOTE — Progress Notes (Signed)
Spouse brought patient's advance directive. This does not provide guidance on CPR, and patient would like to receive CPR if needed. Additionally, patient health care proxy/agent is able to override at any time. Physical copy placed in chart.

## 2020-05-18 DIAGNOSIS — I350 Nonrheumatic aortic (valve) stenosis: Secondary | ICD-10-CM | POA: Diagnosis not present

## 2020-05-18 DIAGNOSIS — I5043 Acute on chronic combined systolic (congestive) and diastolic (congestive) heart failure: Secondary | ICD-10-CM | POA: Diagnosis not present

## 2020-05-18 LAB — BASIC METABOLIC PANEL
Anion gap: 13 (ref 5–15)
BUN: 29 mg/dL — ABNORMAL HIGH (ref 8–23)
CO2: 24 mmol/L (ref 22–32)
Calcium: 9 mg/dL (ref 8.9–10.3)
Chloride: 99 mmol/L (ref 98–111)
Creatinine, Ser: 1.51 mg/dL — ABNORMAL HIGH (ref 0.61–1.24)
GFR, Estimated: 44 mL/min — ABNORMAL LOW (ref >60)
Glucose, Bld: 175 mg/dL — ABNORMAL HIGH (ref 70–99)
Potassium: 3.9 mmol/L (ref 3.5–5.1)
Sodium: 136 mmol/L (ref 135–145)

## 2020-05-18 MED ORDER — FUROSEMIDE 10 MG/ML IJ SOLN
40.0000 mg | Freq: Two times a day (BID) | INTRAMUSCULAR | Status: DC
  Administered 2020-05-18 – 2020-05-19 (×2): 40 mg via INTRAVENOUS
  Filled 2020-05-18 (×2): qty 4

## 2020-05-18 NOTE — Progress Notes (Signed)
Progress Note  Patient Name: [name] [name] Date of Encounter: 05/18/2020  [facility] Cardiologist: Peter Martinique, MD   Subjective   Feeling a little bit better, improved shortness of breath.  Excellent diuresis of approximately 7 L yesterday.  Creatinine stable.  Blood pressure stable.  Inpatient Medications    Scheduled Meds: . aspirin EC  81 mg Oral QHS  . dutasteride  0.5 mg Oral Daily  . enoxaparin (LOVENOX) injection  40 mg Subcutaneous Q24H  . furosemide  80 mg Intravenous BID  . sulfaSALAzine  2,000 mg Oral Daily  . tamsulosin  0.4 mg Oral BID   Continuous Infusions:  PRN Meds: acetaminophen, melatonin, nitroGLYCERIN, ondansetron (ZOFRAN) IV   Vital Signs    Vitals:   05/17/20 1208 05/17/20 1957 05/18/20 0546 05/18/20 0723  BP: 90/72 94/73 117/82 (!) 110/98  Pulse: 75 72 77 86  Resp: 20 18 18 18   Temp: 97.6 F (36.4 C) 98.4 F (36.9 C) 98.3 F (36.8 C) (!) 97.5 F (36.4 C)  TempSrc: Oral Oral Oral Oral  SpO2: 95% 95% 93% 94%  Weight:   79 kg     Intake/Output Summary (Last 24 hours) at 05/18/2020 0935 Last data filed at 05/18/2020 0842 Gross per 24 hour  Intake 2040 ml  Output 8050 ml  Net -6010 ml   Last 3 Weights 05/18/2020 05/17/2020 05/15/2020  Weight (lbs) 174 lb 1.6 oz 182 lb 14.4 oz 193 lb 6.4 oz  Weight (kg) 78.971 kg 82.963 kg 87.726 kg      Telemetry    Sinus rhythm PVCs- Personally Reviewed  ECG    Normal sinus rhythm- Personally Reviewed  Physical Exam   GEN: No acute distress.  Elderly, appears fairly comfortable Neck: No JVD Cardiac: RRR, no murmurs, rubs, or gallops.  Respiratory: Clear to auscultation bilaterally. GI: Soft, nontender, non-distended  MS:  Severe lower extremity edema mildly improved; No deformity. Neuro:  Nonfocal  Psych: Normal affect   Labs    High Sensitivity Troponin:  No results for input(s): TROPONINIHS in the last 720 hours.    Chemistry Recent Labs  Lab 05/16/20 1736 05/17/20 0335  05/18/20 0835  NA 137 137 136  K 4.1 3.6 3.9  CL 100 100 99  CO2 26 26 24   GLUCOSE 101* 93 175*  BUN 26* 27* 29*  CREATININE 1.39* 1.43* 1.51*  CALCIUM 9.2 9.0 9.0  PROT 6.6  --   --   ALBUMIN 3.5  --   --   AST 39  --   --   ALT 25  --   --   ALKPHOS 67  --   --   BILITOT 0.5  --   --   GFRNONAA 48* 47* 44*  ANIONGAP 11 11 13      Hematology Recent Labs  Lab 05/16/20 1736  WBC 5.2  RBC 3.93*  HGB 12.3*  HCT 37.2*  MCV 94.7  MCH 31.3  MCHC 33.1  RDW 14.1  PLT 141*    BNP Recent Labs  Lab 05/16/20 1736  BNP 2,005.2*     DDimer No results for input(s): DDIMER in the last 168 hours.   Radiology    No results found.  Cardiac Studies   ECHO 04/01/20:  1. LVEF has decreased from 12/10/2019, now 30-35% with LV dyssynchrony.  Aortic stenosis is most probably severe with low flow low gradient,  dimensionless index 0.26.  2. Left ventricular ejection fraction, by estimation, is 30 to 35%. The  left ventricle  has moderately decreased function. The left ventricle  demonstrates global hypokinesis. The left ventricular internal cavity size  was mildly dilated. There is severe  asymmetric left ventricular hypertrophy of the basal-septal segment. Left  ventricular diastolic function could not be evaluated.  3. Right ventricular systolic function is mildly reduced. The right  ventricular size is mildly enlarged. There is moderately elevated  pulmonary artery systolic pressure. The estimated right ventricular  systolic pressure is 16.1 mmHg.  4. Left atrial size was severely dilated.  5. Right atrial size was moderately dilated.  6. The mitral valve is normal in structure. Moderate mitral valve  regurgitation. No evidence of mitral stenosis.  7. The aortic valve is normal in structure. Aortic valve regurgitation is  not visualized. Moderate to severe aortic valve stenosis. Aortic valve  mean gradient measures 24.0 mmHg.  8. Aortic dilatation noted. There is  mild to moderate dilatation of the  ascending aorta, measuring 44 mm.  9. The inferior vena cava is dilated in size with <50% respiratory  variability, suggesting right atrial pressure of 15 mmHg.   Patient Profile     86 y.o. male acute on chronic systolic heart failure severe low-flow aortic stenosis  Assessment & Plan    Acute on chronic systolic heart failure with severe low-flow aortic stenosis -Dr. Martinique saw in clinic, he was originally started on Lasix 80 mg a day orally with initial improvement but had progressive heart failure worsening edema and weight gain with 3+ edema on exam. -Multifactorial worsening of heart failure with decreased ejection fraction 35% as well as aortic stenosis playing a role. -Plan is to have continued IV diuresis as he has failed outpatient therapy and to pursue invasive evaluation of right and left heart cath to assess hemodynamics and potential accessibility to structural heart team/TAVR. -Stop losartan on 05/17/2020 given his mild hypotension of blood pressure in the 90s.  Overall he has been doing well otherwise.  Tolerating aggressive diuresis well.  CAD/aortic atherosclerosis -Coronary artery calcification noted on CT of chest 11/15/2019.  Regional wall motion abnormality on echo.  Cardiac cath once able.  Still has more diuresis to go.  Watch renal function. -Consider low-dose Crestor.  Did not tolerate simvastatin in the past.  Severe BPH -Patient self caths.  Longstanding  Secondary pulmonary hypertension -Pressures moderately elevated 55 mmHg PA systolic.  Secondary to left heart failure.  This should improve with diuresis mildly.  Chronic kidney disease stage IV -Creatinine 1.43, 1.5-relatively stable.  Creatinine was 1.4 3 back in May 2021. given his 7 L of diuresis yesterday, I will pull back on his Lasix to 40 mg IV twice daily . continue to keep a close watch on this.  Will replete potassium.  For questions or updates, please  contact [place] Please consult [email or web address] for contact info under        Signed, Candee Furbish, MD  05/18/2020, 9:35 AM

## 2020-05-18 NOTE — Progress Notes (Signed)
Pt self catheterized at 1415 with set up only. He is efficient in procedure. Pt agreeable to cath again at 1815. Family present at bedside for a visit. Pt has no complaints of pain and/or discomfort throughout shift. Ambulates independently around room and is independent in his ADL's.

## 2020-05-18 NOTE — Progress Notes (Signed)
Pt self caths and requires assistance with lubricating the catheter. Done at 0915, offered again at 1215 due to lasix administration however, patient refused and stated, "its too soon, I don't want it done until 5:00 (pm) Educated patient on the importance of not letting his bladder get too full for too long. Agreeable to cath again at 1400.

## 2020-05-18 NOTE — Progress Notes (Signed)
  ReDS Clip Diuretic Study Pt study # [id number]  Your patient has been enrolled in the ReDS Clip Diuretic Study  Severe LEE bilaterally improving today. Scr trending up to 1.51. On lasix IV 80 BID currently. Uop -7.2 L yesterday.  Changes to prescribed diuretics recommended:  Continue IV diuretic regimen given still some LE edema - reduce dose to 40 mg IV BID.  Provider contacted: Dr Marlou Porch Recommendation was accepted by provider.    REDS Clip  READING= 34%  CHEST RULER = 29 Clip Station = C   Orthodema score = 1 Signs/Symptoms Score   Mild edema, no orthopnea 0 No congestion  Moderate edema, no orthopnea 1 Low-grade orthodema/congestion  Severe edema OR orthopnea 2   Moderate edema and orthopnea 3 High-grade orthodema/congestion  Severe edema AND orthopnea 4    Antonietta Jewel, PharmD, BCCCP Clinical Pharmacist  Phone: [phone] 05/18/2020 6:54 AM  Please check AMION for all [place] phone numbers After 10:00 PM, call [place] [phone]

## 2020-05-19 ENCOUNTER — Telehealth: Payer: Self-pay | Admitting: Cardiology

## 2020-05-19 DIAGNOSIS — N1832 Chronic kidney disease, stage 3b: Secondary | ICD-10-CM

## 2020-05-19 DIAGNOSIS — I5043 Acute on chronic combined systolic (congestive) and diastolic (congestive) heart failure: Secondary | ICD-10-CM | POA: Diagnosis not present

## 2020-05-19 DIAGNOSIS — I350 Nonrheumatic aortic (valve) stenosis: Secondary | ICD-10-CM | POA: Diagnosis not present

## 2020-05-19 LAB — BASIC METABOLIC PANEL
Anion gap: 11 (ref 5–15)
BUN: 28 mg/dL — ABNORMAL HIGH (ref 8–23)
CO2: 27 mmol/L (ref 22–32)
Calcium: 9.4 mg/dL (ref 8.9–10.3)
Chloride: 100 mmol/L (ref 98–111)
Creatinine, Ser: 1.44 mg/dL — ABNORMAL HIGH (ref 0.61–1.24)
GFR, Estimated: 46 mL/min — ABNORMAL LOW (ref >60)
Glucose, Bld: 106 mg/dL — ABNORMAL HIGH (ref 70–99)
Potassium: 3.7 mmol/L (ref 3.5–5.1)
Sodium: 138 mmol/L (ref 135–145)

## 2020-05-19 MED ORDER — MELATONIN 5 MG PO TABS
10.0000 mg | ORAL_TABLET | Freq: Every evening | ORAL | Status: DC | PRN
  Administered 2020-05-20 – 2020-05-21 (×2): 10 mg via ORAL
  Filled 2020-05-19 (×2): qty 2

## 2020-05-19 MED ORDER — ASPIRIN 81 MG PO CHEW
81.0000 mg | CHEWABLE_TABLET | ORAL | Status: AC
  Administered 2020-05-20: 81 mg via ORAL
  Filled 2020-05-19: qty 1

## 2020-05-19 MED ORDER — SODIUM CHLORIDE 0.9% FLUSH: 3.0000 mL | INTRAVENOUS | Status: DC | PRN

## 2020-05-19 MED ORDER — SODIUM CHLORIDE 0.9% FLUSH
3.0000 mL | Freq: Two times a day (BID) | INTRAVENOUS | Status: DC
  Administered 2020-05-19 – 2020-05-22 (×3): 3 mL via INTRAVENOUS

## 2020-05-19 MED ORDER — SODIUM CHLORIDE 0.9 % IV SOLN: INTRAVENOUS | Status: DC

## 2020-05-19 MED ORDER — SODIUM CHLORIDE 0.9 % IV SOLN: 250.0000 mL | INTRAVENOUS | Status: DC | PRN

## 2020-05-19 NOTE — H&P (View-Only) (Signed)
Progress Note  Patient Name: [name] [name] Date of Encounter: 05/19/2020  [facility] Cardiologist: Peter Martinique, MD   Subjective   Feeling well this morning. No dyspnea.   Inpatient Medications    Scheduled Meds: . aspirin EC  81 mg Oral QHS  . dutasteride  0.5 mg Oral Daily  . enoxaparin (LOVENOX) injection  40 mg Subcutaneous Q24H  . sulfaSALAzine  2,000 mg Oral Daily  . tamsulosin  0.4 mg Oral BID   Continuous Infusions:  PRN Meds: acetaminophen, melatonin, nitroGLYCERIN, ondansetron (ZOFRAN) IV   Vital Signs    Vitals:   05/18/20 1606 05/18/20 2122 05/19/20 0001 05/19/20 0529  BP: 115/85 110/77  109/85  Pulse: 89 83  75  Resp: 18 17  15   Temp: 97.8 F (36.6 C) 98.1 F (36.7 C)  97.6 F (36.4 C)  TempSrc: Oral Oral  Oral  SpO2: 98% 97%  96%  Weight:   77.5 kg     Intake/Output Summary (Last 24 hours) at 05/19/2020 1003 Last data filed at 05/19/2020 0852 Gross per 24 hour  Intake 834 ml  Output 4200 ml  Net -3366 ml   Last 3 Weights 05/19/2020 05/18/2020 05/17/2020  Weight (lbs) 170 lb 12.8 oz 174 lb 1.6 oz 182 lb 14.4 oz  Weight (kg) 77.474 kg 78.971 kg 82.963 kg      Telemetry    SR with PVCs - Personally Reviewed  ECG    No new tracing this morning.   Physical Exam  Pleasant older male sitting up in the chair GEN: No acute distress.   Neck: No JVD Cardiac: RRR, 3/6 systolic murmur, no rubs, or gallops.  Respiratory: Clear to auscultation bilaterally. GI: Soft, nontender, non-distended  MS: 2+ pitting edema to left shin, ankle and foot; No deformity. Neuro:  Nonfocal  Psych: Normal affect   Labs    High Sensitivity Troponin:  No results for input(s): TROPONINIHS in the last 720 hours.    Chemistry Recent Labs  Lab 05/16/20 1736 05/17/20 0335 05/18/20 0835  NA 137 137 136  K 4.1 3.6 3.9  CL 100 100 99  CO2 26 26 24   GLUCOSE 101* 93 175*  BUN 26* 27* 29*  CREATININE 1.39* 1.43* 1.51*  CALCIUM 9.2 9.0 9.0  PROT 6.6  --    --   ALBUMIN 3.5  --   --   AST 39  --   --   ALT 25  --   --   ALKPHOS 67  --   --   BILITOT 0.5  --   --   GFRNONAA 48* 47* 44*  ANIONGAP 11 11 13      Hematology Recent Labs  Lab 05/16/20 1736  WBC 5.2  RBC 3.93*  HGB 12.3*  HCT 37.2*  MCV 94.7  MCH 31.3  MCHC 33.1  RDW 14.1  PLT 141*    BNP Recent Labs  Lab 05/16/20 1736  BNP 2,005.2*     DDimer No results for input(s): DDIMER in the last 168 hours.   Radiology    No results found.  Cardiac Studies   ECHO 04/01/20:  1. LVEF has decreased from 12/10/2019, now 30-35% with LV dyssynchrony.  Aortic stenosis is most probably severe with low flow low gradient,  dimensionless index 0.26.  2. Left ventricular ejection fraction, by estimation, is 30 to 35%. The  left ventricle has moderately decreased function. The left ventricle  demonstrates global hypokinesis. The left ventricular internal cavity size  was mildly  dilated. There is severe  asymmetric left ventricular hypertrophy of the basal-septal segment. Left  ventricular diastolic function could not be evaluated.  3. Right ventricular systolic function is mildly reduced. The right  ventricular size is mildly enlarged. There is moderately elevated  pulmonary artery systolic pressure. The estimated right ventricular  systolic pressure is 59.1 mmHg.  4. Left atrial size was severely dilated.  5. Right atrial size was moderately dilated.  6. The mitral valve is normal in structure. Moderate mitral valve  regurgitation. No evidence of mitral stenosis.  7. The aortic valve is normal in structure. Aortic valve regurgitation is  not visualized. Moderate to severe aortic valve stenosis. Aortic valve  mean gradient measures 24.0 mmHg.  8. Aortic dilatation noted. There is mild to moderate dilatation of the  ascending aorta, measuring 44 mm.  9. The inferior vena cava is dilated in size with <50% respiratory  variability, suggesting right atrial pressure  of 15 mmHg.   Patient Profile     86 y.o. male with acute on chronic systolic HF, severe AS, BPH, pulmonary HTN, CKD IV who presented from the office with acute HF for diuresis.   Assessment & Plan    1. Acute on Chronic systolic HF: EF noted at 63-84% with severe LVH. Global hypokinesis. He has been diuresed with IV lasix 40mg  BID. Net - 11.7L, and weight is down 182>>170.8lbs. Cr is starting to rise. Will hold on additional diuresis today. BMET is pending.  -- blood pressures have been soft limiting therapy -- plan for [facility] tomorrow pending renal function -- The patient understands that risks included but are not limited to stroke (1 in 1000), death (1 in 1000), kidney failure [usually temporary] (1 in 500), bleeding (1 in 200), allergic reaction [possibly serious] (1 in 200).   2. Severe low flow AS: mean gradient 81mmHg on echo. Planned for [facility] as above.   3. CAD/aortic atherosclerosis: Noted coronary calcifications on CT chest 10/2019. Cath to rule out obstructive CAD  4. CKD IV: Cr 1.5 yesterday. BMET pending this morning. Received IV lasix this morning. Will hold evening dose.  -- BMET in am -> will adjust diuretic based on right heart cath results.  5. Pulmonary HTN: moderately elevated pressures 32mmHg on echo; right heart cath tomorrow to clarify.  6. Severe BPH: patient self caths  For questions or updates, please contact [place] Please consult [email or web address] for contact info under        Signed, Reino Bellis, NP  05/19/2020, 10:03 AM     ATTENDING ATTESTATION  I have seen, examined and evaluated the patient this PM after Reino Bellis, NP-C.  After reviewing all the available data and chart, we discussed the patients laboratory, study & physical findings as well as symptoms in detail. I agree with her findings, examination as well as impression recommendations as per our discussion.     Patient was resting comfortably this evening when I saw him.  He  has had continued diuresis despite not having additional dose of Lasix.  He is not complaining of any significant orthopnea but does note left leg only still having some swelling.  It is definitely better this evening than what was described on the morning note.  Otherwise exam does show pretty significant aortic stenosis murmur, but the lungs are clear.  We are holding diuretic for now 12 right heart cath in the morning with Dr. Martinique.  This will help [place] determine if there is additional diuretics required.  We  will also be in the initial stages of TAVR evaluation.    Glenetta Hew, M.D., M.S. Interventional Cardiologist   Pager # [phone] Phone # [phone] [address] [place], [place] [address]

## 2020-05-19 NOTE — Progress Notes (Signed)
  Mobility Specialist Criteria Algorithm Info.  Mobility Team:  [facility] elevated:Self regulated Activity: Ambulated in room (in chair before and after ambulation. Declined hallway) Range of motion: Active;All extremities Level of assistance: Independent Assistive device: None Minutes sitting in chair:  Minutes stood: 2 minutes Minutes ambulated: 2 minutes Distance ambulated (ft): 20 ft Mobility response: Tolerated well Bed Position: Chair  Received pt walking in room independently, slightly agitated with staff on confusion of his diet order. Prior to admission pt reported being very active and independent. Declined to ambulate in hallway explaining that he had a rough night and wanted to nap. Delegated a later time to return and pt agreed. At this time pt is sitting in recliner chair with all needs met and call bell in reach.   05/19/2020 10:47 AM

## 2020-05-19 NOTE — Telephone Encounter (Signed)
Left message for patient to make sure to asking the rounding physician his questions relating to his procedure.

## 2020-05-19 NOTE — Progress Notes (Signed)
  ReDS Clip Diuretic Study Pt study # [id number]  Your patient has been enrolled in the ReDS Clip Diuretic Study  Severe LEE bilaterally continues to improve (L>R). Encouraged him to elevate legs. SCr elevated further to 1.51. On lasix IV 40 BID currently. Uop -6L yesterday. ReDS up 2% today. Plan for [facility] tomorrow.  Changes to prescribed diuretics recommended:  Agree with holding PM dose of lasix given increase in SCr. Consider ordering TED hose.  Provider contacted: Reino Bellis Recommendation was accepted by provider.    REDS Clip  READING= 36% (goal 25-35%)  CHEST RULER = 29 Clip Station = C   Orthodema score = 1 Signs/Symptoms Score   Mild edema, no orthopnea 0 No congestion  Moderate edema, no orthopnea 1 Low-grade orthodema/congestion  Severe edema OR orthopnea 2   Moderate edema and orthopnea 3 High-grade orthodema/congestion  Severe edema AND orthopnea 4    Kerby Nora, PharmD, BCPS Heart Failure Stewardship Pharmacist Phone [phone]  Please check [email or web address] for unit-specific pharmacist phone numbers

## 2020-05-19 NOTE — Telephone Encounter (Signed)
Patient requesting to speak with Fabian Sharp. Patient states he is currently admitted to the hospital and that Levada Dy is supposed to be coordinating a test/procedure with a doctor that is not Dr. Martinique. Patient is wanting to know when and if that will be taking place.   Please call/advise.   Thank you!

## 2020-05-19 NOTE — Progress Notes (Addendum)
Progress Note  Patient Name: [name] [name] Date of Encounter: 05/19/2020  [facility] Cardiologist: Peter Martinique, MD   Subjective   Feeling well this morning. No dyspnea.   Inpatient Medications    Scheduled Meds: . aspirin EC  81 mg Oral QHS  . dutasteride  0.5 mg Oral Daily  . enoxaparin (LOVENOX) injection  40 mg Subcutaneous Q24H  . sulfaSALAzine  2,000 mg Oral Daily  . tamsulosin  0.4 mg Oral BID   Continuous Infusions:  PRN Meds: acetaminophen, melatonin, nitroGLYCERIN, ondansetron (ZOFRAN) IV   Vital Signs    Vitals:   05/18/20 1606 05/18/20 2122 05/19/20 0001 05/19/20 0529  BP: 115/85 110/77  109/85  Pulse: 89 83  75  Resp: 18 17  15   Temp: 97.8 F (36.6 C) 98.1 F (36.7 C)  97.6 F (36.4 C)  TempSrc: Oral Oral  Oral  SpO2: 98% 97%  96%  Weight:   77.5 kg     Intake/Output Summary (Last 24 hours) at 05/19/2020 1003 Last data filed at 05/19/2020 0852 Gross per 24 hour  Intake 834 ml  Output 4200 ml  Net -3366 ml   Last 3 Weights 05/19/2020 05/18/2020 05/17/2020  Weight (lbs) 170 lb 12.8 oz 174 lb 1.6 oz 182 lb 14.4 oz  Weight (kg) 77.474 kg 78.971 kg 82.963 kg      Telemetry    SR with PVCs - Personally Reviewed  ECG    No new tracing this morning.   Physical Exam  Pleasant older male sitting up in the chair GEN: No acute distress.   Neck: No JVD Cardiac: RRR, 3/6 systolic murmur, no rubs, or gallops.  Respiratory: Clear to auscultation bilaterally. GI: Soft, nontender, non-distended  MS: 2+ pitting edema to left shin, ankle and foot; No deformity. Neuro:  Nonfocal  Psych: Normal affect   Labs    High Sensitivity Troponin:  No results for input(s): TROPONINIHS in the last 720 hours.    Chemistry Recent Labs  Lab 05/16/20 1736 05/17/20 0335 05/18/20 0835  NA 137 137 136  K 4.1 3.6 3.9  CL 100 100 99  CO2 26 26 24   GLUCOSE 101* 93 175*  BUN 26* 27* 29*  CREATININE 1.39* 1.43* 1.51*  CALCIUM 9.2 9.0 9.0  PROT 6.6  --    --   ALBUMIN 3.5  --   --   AST 39  --   --   ALT 25  --   --   ALKPHOS 67  --   --   BILITOT 0.5  --   --   GFRNONAA 48* 47* 44*  ANIONGAP 11 11 13      Hematology Recent Labs  Lab 05/16/20 1736  WBC 5.2  RBC 3.93*  HGB 12.3*  HCT 37.2*  MCV 94.7  MCH 31.3  MCHC 33.1  RDW 14.1  PLT 141*    BNP Recent Labs  Lab 05/16/20 1736  BNP 2,005.2*     DDimer No results for input(s): DDIMER in the last 168 hours.   Radiology    No results found.  Cardiac Studies   ECHO 04/01/20:  1. LVEF has decreased from 12/10/2019, now 30-35% with LV dyssynchrony.  Aortic stenosis is most probably severe with low flow low gradient,  dimensionless index 0.26.  2. Left ventricular ejection fraction, by estimation, is 30 to 35%. The  left ventricle has moderately decreased function. The left ventricle  demonstrates global hypokinesis. The left ventricular internal cavity size  was mildly  dilated. There is severe  asymmetric left ventricular hypertrophy of the basal-septal segment. Left  ventricular diastolic function could not be evaluated.  3. Right ventricular systolic function is mildly reduced. The right  ventricular size is mildly enlarged. There is moderately elevated  pulmonary artery systolic pressure. The estimated right ventricular  systolic pressure is 53.7 mmHg.  4. Left atrial size was severely dilated.  5. Right atrial size was moderately dilated.  6. The mitral valve is normal in structure. Moderate mitral valve  regurgitation. No evidence of mitral stenosis.  7. The aortic valve is normal in structure. Aortic valve regurgitation is  not visualized. Moderate to severe aortic valve stenosis. Aortic valve  mean gradient measures 24.0 mmHg.  8. Aortic dilatation noted. There is mild to moderate dilatation of the  ascending aorta, measuring 44 mm.  9. The inferior vena cava is dilated in size with <50% respiratory  variability, suggesting right atrial pressure  of 15 mmHg.   Patient Profile     86 y.o. male with acute on chronic systolic HF, severe AS, BPH, pulmonary HTN, CKD IV who presented from the office with acute HF for diuresis.   Assessment & Plan    1. Acute on Chronic systolic HF: EF noted at 48-27% with severe LVH. Global hypokinesis. He has been diuresed with IV lasix 40mg  BID. Net - 11.7L, and weight is down 182>>170.8lbs. Cr is starting to rise. Will hold on additional diuresis today. BMET is pending.  -- blood pressures have been soft limiting therapy -- plan for [facility] tomorrow pending renal function -- The patient understands that risks included but are not limited to stroke (1 in 1000), death (1 in 1000), kidney failure [usually temporary] (1 in 500), bleeding (1 in 200), allergic reaction [possibly serious] (1 in 200).   2. Severe low flow AS: mean gradient 77mmHg on echo. Planned for [facility] as above.   3. CAD/aortic atherosclerosis: Noted coronary calcifications on CT chest 10/2019. Cath to rule out obstructive CAD  4. CKD IV: Cr 1.5 yesterday. BMET pending this morning. Received IV lasix this morning. Will hold evening dose.  -- BMET in am -> will adjust diuretic based on right heart cath results.  5. Pulmonary HTN: moderately elevated pressures 73mmHg on echo; right heart cath tomorrow to clarify.  6. Severe BPH: patient self caths  For questions or updates, please contact [place] Please consult [email or web address] for contact info under        Signed, Reino Bellis, NP  05/19/2020, 10:03 AM     ATTENDING ATTESTATION  I have seen, examined and evaluated the patient this PM after Reino Bellis, NP-C.  After reviewing all the available data and chart, we discussed the patients laboratory, study & physical findings as well as symptoms in detail. I agree with her findings, examination as well as impression recommendations as per our discussion.     Patient was resting comfortably this evening when I saw him.  He  has had continued diuresis despite not having additional dose of Lasix.  He is not complaining of any significant orthopnea but does note left leg only still having some swelling.  It is definitely better this evening than what was described on the morning note.  Otherwise exam does show pretty significant aortic stenosis murmur, but the lungs are clear.  We are holding diuretic for now 12 right heart cath in the morning with Dr. Martinique.  This will help [place] determine if there is additional diuretics required.  We  will also be in the initial stages of TAVR evaluation.    Glenetta Hew, M.D., M.S. Interventional Cardiologist   Pager # [phone] Phone # [phone] [address] [place], [place] [address]

## 2020-05-20 ENCOUNTER — Encounter (HOSPITAL_COMMUNITY)
Admission: RE | Disposition: A | Discharge status: Skilled Nursing Facility | Payer: Self-pay | Source: Home / Self Care | Attending: Surgery

## 2020-05-20 ENCOUNTER — Inpatient Hospital Stay (HOSPITAL_COMMUNITY): Payer: Medicare HMO

## 2020-05-20 ENCOUNTER — Encounter (HOSPITAL_COMMUNITY): Payer: Self-pay | Admitting: Cardiology

## 2020-05-20 DIAGNOSIS — Z01810 Encounter for preprocedural cardiovascular examination: Secondary | ICD-10-CM

## 2020-05-20 DIAGNOSIS — I350 Nonrheumatic aortic (valve) stenosis: Secondary | ICD-10-CM

## 2020-05-20 DIAGNOSIS — I5023 Acute on chronic systolic (congestive) heart failure: Secondary | ICD-10-CM

## 2020-05-20 DIAGNOSIS — I25110 Atherosclerotic heart disease of native coronary artery with unstable angina pectoris: Secondary | ICD-10-CM

## 2020-05-20 DIAGNOSIS — L8990 Pressure ulcer of unspecified site, unspecified stage: Secondary | ICD-10-CM | POA: Insufficient documentation

## 2020-05-20 HISTORY — PX: RIGHT/LEFT HEART CATH AND CORONARY ANGIOGRAPHY: CATH118266

## 2020-05-20 LAB — POCT I-STAT EG7
Acid-Base Excess: 3 mmol/L — ABNORMAL HIGH (ref 0.0–2.0)
Bicarbonate: 27.9 mmol/L (ref 20.0–28.0)
Calcium, Ion: 1.14 mmol/L — ABNORMAL LOW (ref 1.15–1.40)
HCT: 37 % — ABNORMAL LOW (ref 39.0–52.0)
Hemoglobin: 12.6 g/dL — ABNORMAL LOW (ref 13.0–17.0)
O2 Saturation: 69 %
Potassium: 3.5 mmol/L (ref 3.5–5.1)
Sodium: 140 mmol/L (ref 135–145)
TCO2: 29 mmol/L (ref 22–32)
pCO2, Ven: 43 mmHg — ABNORMAL LOW (ref 44.0–60.0)
pH, Ven: 7.421 (ref 7.250–7.430)
pO2, Ven: 35 mmHg (ref 32.0–45.0)

## 2020-05-20 LAB — POCT I-STAT 7, (LYTES, BLD GAS, ICA,H+H)
Acid-Base Excess: 3 mmol/L — ABNORMAL HIGH (ref 0.0–2.0)
Bicarbonate: 26.8 mmol/L (ref 20.0–28.0)
Calcium, Ion: 1.18 mmol/L (ref 1.15–1.40)
HCT: 38 % — ABNORMAL LOW (ref 39.0–52.0)
Hemoglobin: 12.9 g/dL — ABNORMAL LOW (ref 13.0–17.0)
O2 Saturation: 98 %
Potassium: 3.7 mmol/L (ref 3.5–5.1)
Sodium: 138 mmol/L (ref 135–145)
TCO2: 28 mmol/L (ref 22–32)
pCO2 arterial: 37.5 mmHg (ref 32.0–48.0)
pH, Arterial: 7.462 — ABNORMAL HIGH (ref 7.350–7.450)
pO2, Arterial: 99 mmHg (ref 83.0–108.0)

## 2020-05-20 LAB — BASIC METABOLIC PANEL
Anion gap: 11 (ref 5–15)
BUN: 29 mg/dL — ABNORMAL HIGH (ref 8–23)
CO2: 25 mmol/L (ref 22–32)
Calcium: 9 mg/dL (ref 8.9–10.3)
Chloride: 100 mmol/L (ref 98–111)
Creatinine, Ser: 1.42 mg/dL — ABNORMAL HIGH (ref 0.61–1.24)
GFR, Estimated: 47 mL/min — ABNORMAL LOW (ref >60)
Glucose, Bld: 101 mg/dL — ABNORMAL HIGH (ref 70–99)
Potassium: 3.8 mmol/L (ref 3.5–5.1)
Sodium: 136 mmol/L (ref 135–145)

## 2020-05-20 SURGERY — RIGHT/LEFT HEART CATH AND CORONARY ANGIOGRAPHY
Anesthesia: LOCAL

## 2020-05-20 MED ORDER — SODIUM CHLORIDE 0.9 % IV SOLN: 250.0000 mL | INTRAVENOUS | Status: DC | PRN

## 2020-05-20 MED ORDER — SODIUM CHLORIDE 0.9% FLUSH
3.0000 mL | Freq: Two times a day (BID) | INTRAVENOUS | Status: DC
  Administered 2020-05-20 – 2020-05-22 (×4): 3 mL via INTRAVENOUS

## 2020-05-20 MED ORDER — VERAPAMIL HCL 2.5 MG/ML IV SOLN
INTRAVENOUS | Status: AC
  Filled 2020-05-20: qty 2

## 2020-05-20 MED ORDER — VERAPAMIL HCL 2.5 MG/ML IV SOLN
INTRAVENOUS | Status: DC | PRN
  Administered 2020-05-20: 10 mL via INTRA_ARTERIAL

## 2020-05-20 MED ORDER — HEPARIN (PORCINE) IN NACL 1000-0.9 UT/500ML-% IV SOLN
INTRAVENOUS | Status: DC | PRN
  Administered 2020-05-20 (×2): 500 mL

## 2020-05-20 MED ORDER — ENOXAPARIN SODIUM 40 MG/0.4ML ~~LOC~~ SOLN
40.0000 mg | SUBCUTANEOUS | Status: DC
  Administered 2020-05-21 – 2020-05-22 (×2): 40 mg via SUBCUTANEOUS
  Filled 2020-05-20 (×2): qty 0.4

## 2020-05-20 MED ORDER — HEPARIN SODIUM (PORCINE) 1000 UNIT/ML IJ SOLN
INTRAMUSCULAR | Status: DC | PRN
  Administered 2020-05-20: 4000 [IU] via INTRAVENOUS

## 2020-05-20 MED ORDER — HEPARIN (PORCINE) IN NACL 1000-0.9 UT/500ML-% IV SOLN
INTRAVENOUS | Status: AC
  Filled 2020-05-20: qty 500

## 2020-05-20 MED ORDER — IOHEXOL 350 MG/ML SOLN
INTRAVENOUS | Status: DC | PRN
  Administered 2020-05-20: 80 mL

## 2020-05-20 MED ORDER — HEPARIN SODIUM (PORCINE) 1000 UNIT/ML IJ SOLN
INTRAMUSCULAR | Status: AC
  Filled 2020-05-20: qty 1

## 2020-05-20 MED ORDER — LIDOCAINE HCL (PF) 1 % IJ SOLN
INTRAMUSCULAR | Status: DC | PRN
  Administered 2020-05-20: 3 mL
  Administered 2020-05-20: 4 mL

## 2020-05-20 MED ORDER — FUROSEMIDE 40 MG PO TABS
40.0000 mg | ORAL_TABLET | Freq: Two times a day (BID) | ORAL | Status: DC
  Administered 2020-05-20 – 2020-05-22 (×5): 40 mg via ORAL
  Filled 2020-05-20 (×5): qty 1

## 2020-05-20 MED ORDER — LIDOCAINE HCL (PF) 1 % IJ SOLN
INTRAMUSCULAR | Status: AC
  Filled 2020-05-20: qty 30

## 2020-05-20 MED ORDER — SODIUM CHLORIDE 0.9% FLUSH: 3.0000 mL | INTRAVENOUS | Status: DC | PRN

## 2020-05-20 SURGICAL SUPPLY — 16 items
CATH 5FR JL3.5 JR4 ANG PIG MP (CATHETERS) ×1 IMPLANT
CATH BALLN WEDGE 5F 110CM (CATHETERS) ×1 IMPLANT
CATH INFINITI 5FR AL1 (CATHETERS) ×1 IMPLANT
CATH INFINITI 5FR JL4 (CATHETERS) ×1 IMPLANT
CATH INFINITI 5FR JL5 (CATHETERS) ×1 IMPLANT
DEVICE RAD COMP TR BAND LRG (VASCULAR PRODUCTS) ×1 IMPLANT
GLIDESHEATH SLEND SS 6F .021 (SHEATH) ×1 IMPLANT
GUIDEWIRE .025 260CM (WIRE) ×1 IMPLANT
GUIDEWIRE INQWIRE 1.5J.035X260 (WIRE) IMPLANT
INQWIRE 1.5J .035X260CM (WIRE) ×2
KIT HEART LEFT (KITS) ×2 IMPLANT
PACK CARDIAC CATHETERIZATION (CUSTOM PROCEDURE TRAY) ×2 IMPLANT
SHEATH GLIDE SLENDER 4/5FR (SHEATH) ×1 IMPLANT
TRANSDUCER W/STOPCOCK (MISCELLANEOUS) ×2 IMPLANT
TUBING CIL FLEX 10 FLL-RA (TUBING) ×2 IMPLANT
WIRE EMERALD ST .035X150CM (WIRE) ×1 IMPLANT

## 2020-05-20 NOTE — Consult Note (Signed)
[address]       [place], [address]             [phone]      Cardiothoracic Surgery Consultation  Reason for Consult: Severe multivessel coronary disease and severe aortic stenosis Referring Physician: Dr. Peter Martinique  [name] [name] is an 86 y.o. male.  HPI:   The patient is a 86 year old gentleman with hypertension, hyperlipidemia, degenerative arthritis status post bilateral knee replacement and right shoulder replacement, peripheral neuropathy, and aortic stenosis with chronic diastolic congestive heart failure who has been followed by Dr. Martinique.  He has a history of chronic lower extremity edema with no prior DVT.  He was treated for pneumonia in April 2021 and was Covid negative.  He was also found to have a pleural effusion and started on Lasix.  An echocardiogram in June 2021 showed a left ventricular ejection fraction of 40 to 45% with global hypokinesis and severe hypokinesis of the entire inferior wall and inferoseptal wall.  There was moderate aortic stenosis with a mean gradient of 20 mmHg, aortic valve area 1.38 cm, and dimensionless index of 0.33.  There is also moderate mitral regurgitation.  He was seen in September 2021 with worsening congestive heart failure with increasing lower extremity edema, weight gain, and progressive fatigue.  His diuretic was increased.  A follow-up echocardiogram on 04/01/2020 showed a further drop in his ejection fraction to 30 to 35% with global hypokinesis.  There is moderate mitral regurgitation.  The aortic valve leaflets were thickened and calcified with a mean gradient of 24 mmHg and a valve area of 1.14 cm.  Dimensionless index was 0.25.  He was seen in the office on 05/15/2020 by Dr. Martinique and reported an 11 pound weight gain over the prior 3 weeks with progressive shortness of breath, lower extremity edema, and fatigue.  He was admitted for intravenous diuresis and is 14.3 L negative since admission.  Cardiac  catheterization was performed today and showed severe three-vessel coronary disease with heavily calcified vessels.  The LAD has 85% proximal stenosis.  The left circumflex has a large first marginal branch that has 95% stenosis and then is occluded with faint filling of the second marginal by collaterals.  The right coronary artery has 90% mid vessel stenosis followed by occlusion with the PDA and PL branch filling by collaterals from the left.  The patient lives at home with his wife.  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  Past Medical History:  Diagnosis Date  . Aortic stenosis   . Arthritis   . CAD (coronary artery disease)   . Colitis   . Edema   . Essential hypertension    no med now  . Essential tremor   . Hyperlipidemia   . Hypotonic bladder    L4-L5  . Obstructive uropathy   . Peripheral neuropathy    Elevated by Dr. love 2010  . Prostate cancer ([facility]) 2005    Past Surgical History:  Procedure Laterality Date  . ACHILLES TENDON SURGERY Bilateral 1995  . COLONOSCOPY     With polyp resection  . REVERSE SHOULDER ARTHROPLASTY Right 06/04/2016  . REVERSE SHOULDER ARTHROPLASTY Right 06/04/2016   Procedure: REVERSE SHOULDER ARTHROPLASTY;  Surgeon: Netta Cedars, MD;  Location: [facility];  Service: Orthopedics;  Laterality: Right;  . RIGHT/LEFT HEART CATH AND CORONARY ANGIOGRAPHY N/A 05/20/2020   Procedure: RIGHT/LEFT HEART CATH AND CORONARY ANGIOGRAPHY;  Surgeon: Martinique, Peter M, MD;  Location: [facility];  Service: Cardiovascular;  Laterality: N/A;  . SKIN CANCER EXCISION  1974   On chest wall  . TOTAL KNEE ARTHROPLASTY Right 2011  . TOTAL KNEE ARTHROPLASTY Left 04/09/2013   Procedure: LEFT TOTAL KNEE ARTHROPLASTY;  Surgeon: Gearlean Alf, MD;  Location: [facility];  Service: Orthopedics;  Laterality: Left;    Family History  Problem Relation Age of Onset  . Hypertension Mother   . Heart failure Mother   . Diabetes  Mother   . Heart attack Brother   . Heart attack Maternal Aunt     Social History:  reports that he has never smoked. He has never used smokeless tobacco. He reports current alcohol use of about 1.0 standard drink of alcohol per week. He reports that he does not use drugs.  Allergies:  Allergies  Allergen Reactions  . Simvastatin Other (See Comments)    "caused neuropathy pain"    Medications:  I have reviewed the patient's current medications. Prior to Admission:  Medications Prior to Admission  Medication Sig Dispense Refill Last Dose  . aspirin 81 MG tablet Take 81 mg by mouth at bedtime.    05/16/2020 at Unknown time  . dutasteride (AVODART) 0.5 MG capsule Take 0.5 mg by mouth daily.   05/16/2020 at Unknown time  . furosemide (LASIX) 40 MG tablet Take 1 tablet (40 mg total) by mouth 2 (two) times daily. 60 tablet 3 05/16/2020 at Unknown time  . losartan (COZAAR) 25 MG tablet Take 25 mg by mouth daily.   05/16/2020 at Unknown time  . Multiple Vitamins-Minerals (CENTRUM SILVER PO) Take 1 tablet by mouth daily.    05/16/2020 at Unknown time  . sulfaSALAzine (AZULFIDINE) 500 MG tablet Take 2,000 mg by mouth daily.    05/16/2020 at Unknown time  . tamsulosin (FLOMAX) 0.4 MG CAPS Take 0.4 mg by mouth 2 (two) times daily.    05/16/2020 at Unknown time   Scheduled: . aspirin EC  81 mg Oral QHS  . dutasteride  0.5 mg Oral Daily  . [START ON 05/21/2020] enoxaparin (LOVENOX) injection  40 mg Subcutaneous Q24H  . furosemide  40 mg Oral BID  . sodium chloride flush  3 mL Intravenous Q12H  . sodium chloride flush  3 mL Intravenous Q12H  . sulfaSALAzine  2,000 mg Oral Daily  . tamsulosin  0.4 mg Oral BID   Continuous: . sodium chloride     ONG:EXBMWU chloride, acetaminophen, melatonin, nitroGLYCERIN, ondansetron (ZOFRAN) IV, sodium chloride flush Anti-infectives (From admission, onward)   None      Results for orders placed or performed during the hospital encounter of 05/16/20  (from the past 48 hour(s))  Basic metabolic panel     Status: Abnormal   Collection Time: 05/19/20 10:32 AM  Result Value Ref Range   Sodium 138 135 - 145 mmol/L   Potassium 3.7 3.5 - 5.1 mmol/L   Chloride 100 98 - 111 mmol/L   CO2 27 22 - 32 mmol/L   Glucose, Bld 106 (H) 70 - 99 mg/dL    Comment: Glucose reference range applies only to samples taken after fasting for at least 8 hours.   BUN 28 (H) 8 - 23 mg/dL   Creatinine, Ser 1.44 (H) 0.61 - 1.24 mg/dL   Calcium 9.4 8.9 - 10.3 mg/dL   GFR, Estimated 46 (L) >60 mL/min    Comment: (NOTE) Calculated using the CKD-EPI Creatinine Equation (2021)  Anion gap 11 5 - 15    Comment: Performed at [name] [address], [place], [place] [address]  Basic metabolic panel     Status: Abnormal   Collection Time: 05/20/20  3:40 AM  Result Value Ref Range   Sodium 136 135 - 145 mmol/L   Potassium 3.8 3.5 - 5.1 mmol/L   Chloride 100 98 - 111 mmol/L   CO2 25 22 - 32 mmol/L   Glucose, Bld 101 (H) 70 - 99 mg/dL    Comment: Glucose reference range applies only to samples taken after fasting for at least 8 hours.   BUN 29 (H) 8 - 23 mg/dL   Creatinine, Ser 1.42 (H) 0.61 - 1.24 mg/dL   Calcium 9.0 8.9 - 10.3 mg/dL   GFR, Estimated 47 (L) >60 mL/min    Comment: (NOTE) Calculated using the CKD-EPI Creatinine Equation (2021)    Anion gap 11 5 - 15    Comment: Performed at [facility] [address], [place], [place] [address]  I-STAT 7, (LYTES, BLD GAS, ICA, H+H)     Status: Abnormal   Collection Time: 05/20/20  9:25 AM  Result Value Ref Range   pH, Arterial 7.462 (H) 7.35 - 7.45   pCO2 arterial 37.5 32 - 48 mmHg   pO2, Arterial 99 83 - 108 mmHg   Bicarbonate 26.8 20.0 - 28.0 mmol/L   TCO2 28 22 - 32 mmol/L   O2 Saturation 98.0 %   Acid-Base Excess 3.0 (H) 0.0 - 2.0 mmol/L   Sodium 138 135 - 145 mmol/L   Potassium 3.7 3.5 - 5.1 mmol/L   Calcium, Ion 1.18 1.15 - 1.40 mmol/L   HCT 38.0 (L) 39 - 52 %   Hemoglobin 12.9  (L) 13.0 - 17.0 g/dL   Sample type ARTERIAL     CARDIAC CATHETERIZATION  Addendum Date: 05/20/2020    Mid RCA lesion is 90% stenosed.  Mid RCA to Dist RCA lesion is 100% stenosed.  Prox LAD to Mid LAD lesion is 85% stenosed.  1st Mrg lesion is 95% stenosed.  Prox Cx to Mid Cx lesion is 100% stenosed.  LV end diastolic pressure is mildly elevated.  Hemodynamic findings consistent with mild pulmonary hypertension.  2nd Diag lesion is 100% stenosed.  1. Severe complex 3 vessel obstructive CAD. 2. Low flow Aortic stenosis. The valve is severely calcified. By cath mean gradient is 14 mm Hg. 3. Mildly elevated LV filling pressures 4. Mild pulmonary HTN with mean PA pressure 22 mmHg 5. Cardiac index 2.74. Plan: surgical consultation for CABG/AVR. Will resume lasix 40 mg po bid.   Result Date: 05/20/2020  Mid RCA lesion is 90% stenosed.  Mid RCA to Dist RCA lesion is 100% stenosed.  Prox LAD to Mid LAD lesion is 85% stenosed.  1st Mrg lesion is 95% stenosed.  Prox Cx to Mid Cx lesion is 100% stenosed.  LV end diastolic pressure is mildly elevated.  Hemodynamic findings consistent with mild pulmonary hypertension.  1. Severe complex 3 vessel obstructive CAD. 2. Low flow Aortic stenosis. The valve is severely calcified. By cath mean gradient is 14 mm Hg. 3. Mildly elevated LV filling pressures 4. Mild pulmonary HTN with mean PA pressure 22 mmHg 5. Cardiac index 2.74. Plan: surgical consultation for CABG/AVR. Will resume lasix 40 mg po bid.    Review of Systems  Constitutional: Positive for activity change and fatigue.  HENT: Negative for dental problem.   Eyes: Negative.  Respiratory: Positive for shortness of breath.   Cardiovascular: Positive for leg swelling. Negative for chest pain.  Gastrointestinal: Negative.   Endocrine: Negative.   Genitourinary: Negative.   Musculoskeletal: Positive for arthralgias.  Skin: Negative.   Allergic/Immunologic: Negative.   Neurological: Negative  for dizziness and syncope.  Hematological: Negative.   Psychiatric/Behavioral: Negative.    Blood pressure 98/68, pulse 75, temperature 97.8 F (36.6 C), temperature source Oral, resp. rate 18, weight 76.8 kg, SpO2 96 %. Physical Exam Constitutional:      Appearance: Normal appearance. He is normal weight.  HENT:     Head: Normocephalic and atraumatic.     Mouth/Throat:     Mouth: Mucous membranes are moist.     Pharynx: Oropharynx is clear.  Eyes:     Extraocular Movements: Extraocular movements intact.     Conjunctiva/sclera: Conjunctivae normal.     Pupils: Pupils are equal, round, and reactive to light.  Neck:     Comments: Transmitted murmur to both sides of his neck Cardiovascular:     Rate and Rhythm: Normal rate and regular rhythm.     Pulses: Normal pulses.     Heart sounds: Murmur heard.      Comments: 3/6 systolic murmur along the right sternal border.  No diastolic murmur. Pulmonary:     Effort: Pulmonary effort is normal.     Breath sounds: Normal breath sounds.  Abdominal:     General: Abdomen is flat. Bowel sounds are normal.     Palpations: Abdomen is soft.     Tenderness: There is no abdominal tenderness.  Musculoskeletal:        General: Swelling present.     Cervical back: Neck supple.  Lymphadenopathy:     Cervical: No cervical adenopathy.  Skin:    General: Skin is warm and dry.  Neurological:     General: No focal deficit present.     Mental Status: He is alert and oriented to person, place, and time.  Psychiatric:        Mood and Affect: Mood normal.        Behavior: Behavior normal.        Thought Content: Thought content normal.        Judgment: Judgment normal.     ECHOCARDIOGRAM REPORT       Patient Name:  [name] [name] Date of Exam: 04/01/2020  Medical Rec #: [id number]   Height:    69.0 in  Accession #:  [id number]  Weight:    178.4 lb  Date of Birth: [birth year]   BSA:     1.968 m  Patient Age:  76 years    BP:      110/72 mmHg  Patient Gender: M       HR:      185 bpm.  Exam Location: [facility]   Procedure: 2D Echo, Cardiac Doppler and Color Doppler   Indications:  B09.62 Acute systolic CHF    History:    Patient has prior history of Echocardiogram examinations,  most         recent 12/10/2019. Risk Factors:Hypertension and  Dyslipidemia.         Edema.    Sonographer:  Cresenciano Lick RDCS  Referring Phys: [id number] [place]    1. LVEF has decreased from 12/10/2019, now 30-35% with LV dyssynchrony.  Aortic stenosis is most probably severe with low flow low gradient,  dimensionless index 0.26.  2. Left ventricular ejection fraction, by estimation,  is 30 to 35%. The  left ventricle has moderately decreased function. The left ventricle  demonstrates global hypokinesis. The left ventricular internal cavity size  was mildly dilated. There is severe  asymmetric left ventricular hypertrophy of the basal-septal segment. Left  ventricular diastolic function could not be evaluated.  3. Right ventricular systolic function is mildly reduced. The right  ventricular size is mildly enlarged. There is moderately elevated  pulmonary artery systolic pressure. The estimated right ventricular  systolic pressure is 32.2 mmHg.  4. Left atrial size was severely dilated.  5. Right atrial size was moderately dilated.  6. The mitral valve is normal in structure. Moderate mitral valve  regurgitation. No evidence of mitral stenosis.  7. The aortic valve is normal in structure. Aortic valve regurgitation is  not visualized. Moderate to severe aortic valve stenosis. Aortic valve  mean gradient measures 24.0 mmHg.  8. Aortic dilatation noted. There is mild to moderate dilatation of the  ascending aorta, measuring 44 mm.  9. The inferior vena cava is dilated in size with <50% respiratory  variability, suggesting right  atrial pressure of 15 mmHg.   FINDINGS  Left Ventricle: Left ventricular ejection fraction, by estimation, is 30  to 35%. The left ventricle has moderately decreased function. The left  ventricle demonstrates global hypokinesis. The left ventricular internal  cavity size was mildly dilated.  There is severe asymmetric left ventricular hypertrophy of the  basal-septal segment. Abnormal (paradoxical) septal motion, consistent  with left bundle branch block. Left ventricular diastolic function could  not be evaluated due to atrial fibrillation.  Left ventricular diastolic function could not be evaluated.   Right Ventricle: The right ventricular size is mildly enlarged. No  increase in right ventricular wall thickness. Right ventricular systolic  function is mildly reduced. There is moderately elevated pulmonary artery  systolic pressure. The tricuspid  regurgitant velocity is 3.17 m/s, and with an assumed right atrial  pressure of 15 mmHg, the estimated right ventricular systolic pressure is  02.5 mmHg.   Left Atrium: Left atrial size was severely dilated.   Right Atrium: Right atrial size was moderately dilated.   Pericardium: There is no evidence of pericardial effusion.   Mitral Valve: The mitral valve is normal in structure. Moderate mitral  valve regurgitation. No evidence of mitral valve stenosis.   Tricuspid Valve: The tricuspid valve is normal in structure. Tricuspid  valve regurgitation is mild . No evidence of tricuspid stenosis.   Aortic Valve: The aortic valve is normal in structure. Aortic valve  regurgitation is not visualized. Moderate to severe aortic stenosis is  present. Aortic valve mean gradient measures 24.0 mmHg. Aortic valve peak  gradient measures 19.9 mmHg. Aortic valve  area, by VTI measures 1.14 cm.   Pulmonic Valve: The pulmonic valve was normal in structure. Pulmonic valve  regurgitation is not visualized. No evidence of pulmonic stenosis.    Aorta: The aortic root is normal in size and structure and aortic  dilatation noted. There is mild to moderate dilatation of the ascending  aorta, measuring 44 mm.   Venous: The inferior vena cava is dilated in size with less than 50%  respiratory variability, suggesting right atrial pressure of 15 mmHg.   IAS/Shunts: No atrial level shunt detected by color flow Doppler.     LEFT VENTRICLE  PLAX 2D  LVIDd:     4.80 cm Diastology  LVIDs:     4.50 cm LV e' medial:  6.09 cm/s  LV PW:  1.30 cm LV E/e' medial: 17.7  LV IVS:    1.80 cm LV e' lateral:  11.20 cm/s  LVOT diam:   2.40 cm LV E/e' lateral: 9.6  LV SV:     41  LV SV Index:  21  LVOT Area:   4.52 cm     RIGHT VENTRICLE       IVC  RV Basal diam: 5.30 cm   IVC diam: 2.30 cm  RV S prime:   10.47 cm/s  TAPSE (M-mode): 1.3 cm   LEFT ATRIUM       Index    RIGHT ATRIUM      Index  LA diam:    5.30 cm 2.69 cm/m RA Area:   25.40 cm  LA Vol (A2C):  124.0 ml 63.01 ml/m RA Volume:  89.70 ml 45.58 ml/m  LA Vol (A4C):  128.0 ml 65.04 ml/m  LA Biplane Vol: 127.0 ml 64.53 ml/m  AORTIC VALVE  AV Area (Vmax):  1.31 cm  AV Area (Vmean):  1.15 cm  AV Area (VTI):   1.14 cm  AV Vmax:      223.00 cm/s  AV Vmean:     182.800 cm/s  AV VTI:      0.363 m  AV Peak Grad:   19.9 mmHg  AV Mean Grad:   24.0 mmHg  LVOT Vmax:     64.60 cm/s  LVOT Vmean:    46.300 cm/s  LVOT VTI:     0.091 m  LVOT/AV VTI ratio: 0.25    AORTA  Ao Root diam: 4.10 cm  Ao Asc diam: 4.40 cm   MITRAL VALVE        TRICUSPID VALVE  MV Area (PHT): 4.39 cm   TR Peak grad:  40.2 mmHg  MV Decel Time: 173 msec   TR Vmax:    317.00 cm/s  MR Peak grad: 109.4 mmHg  MR Mean grad: 72.0 mmHg   SHUNTS  MR Vmax:   523.00 cm/s  Systemic VTI: 0.09 m  MR Vmean:   400.0 cm/s  Systemic Diam: 2.40 cm  MV E velocity: 108.05  cm/s  MV A velocity: 57.40 cm/s  MV E/A ratio: 1.88   Ena Dawley MD  Electronically signed by Ena Dawley MD  Signature Date/Time: 04/01/2020/5:45:40 PM     Physicians  Panel Physicians Referring Physician Case Authorizing Physician  Martinique, Peter M, MD (Primary)    Procedures  RIGHT/LEFT HEART CATH AND CORONARY ANGIOGRAPHY  Conclusion    Mid RCA lesion is 90% stenosed.  Mid RCA to Dist RCA lesion is 100% stenosed.  Prox LAD to Mid LAD lesion is 85% stenosed.  1st Mrg lesion is 95% stenosed.  Prox Cx to Mid Cx lesion is 100% stenosed.  LV end diastolic pressure is mildly elevated.  Hemodynamic findings consistent with mild pulmonary hypertension.  2nd Diag lesion is 100% stenosed.   1. Severe complex 3 vessel obstructive CAD.  2. Low flow Aortic stenosis. The valve is severely calcified. By cath mean gradient is 14 mm Hg.  3. Mildly elevated LV filling pressures 4. Mild pulmonary HTN with mean PA pressure 22 mmHg 5. Cardiac index 2.74.  Plan: surgical consultation for CABG/AVR. Will resume lasix 40 mg po bid.   Recommendations  Antiplatelet/Anticoag Recommend Aspirin 81mg  daily for moderate CAD.  Indications  Acute on chronic systolic CHF (congestive heart failure) (HCC) [I50.23 (ICD-10-CM)]  Nonrheumatic aortic valve stenosis [I35.0 (ICD-10-CM)]  Procedural Details  Technical Details Indication: 86 yo WM  with progressive CHF and decreasing EF to 30-35%. By Echo has low flow moderate to severe Aortic stenosis with severely calcified valve.  Procedural Details: The right wrist was prepped, draped, and anesthetized with 1% lidocaine. Using the modified Seldinger technique a 6 Fr slender sheath was placed in the right radial artery and a 5 French sheath was placed in the right brachial vein. A Swan-Ganz catheter was used for the right heart catheterization. Standard protocol was followed for recording of right heart pressures and sampling of oxygen  saturations. Fick cardiac output was calculated. Standard Judkins catheters were used for selective coronary angiography and left ventricular pressures. The LCA was engaged with a JL5 catheter. The LCA has separate ostia for the LAD and LCx. There were no immediate procedural complications. The patient was transferred to the post catheterization recovery area for further monitoring. Contrast: 80 cc Estimated blood loss <50 mL.   During this procedure no sedation was administered.  Medications (Filter: Administrations occurring from 0901 to 1004 on 05/20/20) (important) Continuous medications are totaled by the amount administered until 05/20/20 1004.  lidocaine (PF) (XYLOCAINE) 1 % injection (mL) Total volume:  7 mL Date/Time  Rate/Dose/Volume Action  05/20/20 0919  3 mL Given  0921  4 mL Given    Heparin (Porcine) in NaCl 1000-0.9 UT/500ML-% SOLN (mL) Total volume:  1,000 mL Date/Time  Rate/Dose/Volume Action  05/20/20 0921  500 mL Given  0921  500 mL Given    Radial Cocktail/Verapamil only (mL) Total volume:  10 mL Date/Time  Rate/Dose/Volume Action  05/20/20 0922  10 mL Given    heparin sodium (porcine) injection (Units) Total dose:  4,000 Units Date/Time  Rate/Dose/Volume Action  05/20/20 0932  4,000 Units Given    iohexol (OMNIPAQUE) 350 MG/ML injection (mL) Total volume:  80 mL Date/Time  Rate/Dose/Volume Action  05/20/20 0955  80 mL Given    acetaminophen (TYLENOL) tablet 650 mg (mg) Total dose:  Cannot be calculated* Dosing weight:  87.7 *Administration dose not documented Date/Time  Rate/Dose/Volume Action  05/20/20 0904  *Not included in total MAR Hold    aspirin EC tablet 81 mg (mg) Total dose:  Cannot be calculated* Dosing weight:  87.7 *Administration dose not documented Date/Time  Rate/Dose/Volume Action  05/20/20 0904  *Not included in total MAR Hold    dutasteride (AVODART) capsule 0.5 mg (mg) Total dose:  Cannot be calculated* Dosing weight:   87.7 *Administration dose not documented Date/Time  Rate/Dose/Volume Action  05/20/20 0904  *Not included in total MAR Hold  1000  *Not included in total Automatically Held    melatonin tablet 10 mg (mg) Total dose:  Cannot be calculated* Dosing weight:  77.5 *Administration dose not documented Date/Time  Rate/Dose/Volume Action  05/20/20 0904  *Not included in total MAR Hold    nitroGLYCERIN (NITROSTAT) SL tablet 0.4 mg (mg) Total dose:  Cannot be calculated* Dosing weight:  87.7 *Administration dose not documented Date/Time  Rate/Dose/Volume Action  05/20/20 0904  *Not included in total MAR Hold    ondansetron (ZOFRAN) injection 4 mg (mg) Total dose:  Cannot be calculated* Dosing weight:  87.7 *Administration dose not documented Date/Time  Rate/Dose/Volume Action  05/20/20 0904  *Not included in total MAR Hold    sodium chloride flush (NS) 0.9 % injection 3 mL (mL) Total dose:  Cannot be calculated* Dosing weight:  77.5 *Administration dose not documented Date/Time  Rate/Dose/Volume Action  05/20/20 0904  *Not included in total MAR Hold  1000  *Not included in  total Automatically Held    sulfaSALAzine (AZULFIDINE) tablet 2,000 mg (mg) Total dose:  Cannot be calculated* Dosing weight:  87.7 *Administration dose not documented Date/Time  Rate/Dose/Volume Action  05/20/20 0904  *Not included in total MAR Hold  1000  *Not included in total Automatically Held    tamsulosin (FLOMAX) capsule 0.4 mg (mg) Total dose:  Cannot be calculated* Dosing weight:  87.7 *Administration dose not documented Date/Time  Rate/Dose/Volume Action  05/20/20 0904  *Not included in total MAR Hold  1000  *Not included in total Automatically Held    enoxaparin (LOVENOX) injection 40 mg (mg) Total dose:  Cannot be calculated* Dosing weight:  87.7 *Administration dose not documented Date/Time  Rate/Dose/Volume Action  05/20/20 0904  *Not included in total MAR Hold    Contrast  Medication Name  Total Dose  iohexol (OMNIPAQUE) 350 MG/ML injection 80 mL    Radiation/Fluoro  Fluoro time: 13.2 (min) DAP: 23578 (mGycm2) Cumulative Air Kerma: 299 (mGy)  Complications  Complications documented before study signed (05/20/2020 37:16 AM)   No complications were associated with this study.  Documented by Martinique, Peter M, MD - 05/20/2020 10:15 AM    Coronary Findings  Diagnostic Dominance: Right Left Anterior Descending  The vessel originates from a separate ostium.  Prox LAD to Mid LAD lesion is 85% stenosed. The lesion is moderately calcified. The LAD is aneurysmal just distal to the stenosis  Second Diagonal Branch  Collaterals  2nd Diag filled by collaterals from 1st Diag.    2nd Diag lesion is 100% stenosed.  Left Circumflex  The vessel originates from a separate ostium.  Prox Cx to Mid Cx lesion is 100% stenosed. The lesion is severely calcified.  First Obtuse Marginal Branch  1st Mrg lesion is 95% stenosed. The lesion is segmental and irregular. The lesion is severely calcified.  Second Obtuse Marginal Branch  Collaterals  2nd Mrg filled by collaterals from 1st Mrg.    Right Coronary Artery  Vessel was injected. Vessel is large. The vessel is severely calcified. The vessel is moderately tortuous.  Mid RCA lesion is 90% stenosed.  Mid RCA to Dist RCA lesion is 100% stenosed.  Right Posterior Descending Artery  Collaterals  RPDA filled by collaterals from Dist LAD.    Intervention  No interventions have been documented. Right Heart  Right Heart Pressures Hemodynamic findings consistent with mild pulmonary hypertension.  Left Heart  Left Ventricle LV end diastolic pressure is mildly elevated.  Coronary Diagrams  Diagnostic Dominance: Right  Intervention  Implants   No implant documentation for this case.  Syngo Images  Show images for CARDIAC CATHETERIZATION Images on Long Term Storage  Show images for Zakariye, Nee to Procedure  Log  Procedure Log    Hemo Data   Most Recent Value  Fick Cardiac Output 5.28 L/min  Fick Cardiac Output Index 2.74 (L/min)/BSA  Aortic Mean Gradient 13.88 mmHg  Aortic Peak Gradient 9 mmHg  Aortic Valve Area 1.95  Aortic Value Area Index 1.01 cm2/BSA  RA A Wave 8 mmHg  RA V Wave 9 mmHg  RA Mean 6 mmHg  RV Systolic Pressure 36 mmHg  RV Diastolic Pressure 4 mmHg  RV EDP 7 mmHg  PA Systolic Pressure 35 mmHg  PA Diastolic Pressure 13 mmHg  PA Mean 22 mmHg  PW A Wave 27 mmHg  PW V Wave 25 mmHg  PW Mean 20 mmHg  AO Systolic Pressure 967 mmHg  AO Diastolic Pressure 69 mmHg  AO Mean 85  mmHg  LV Systolic Pressure 160 mmHg  LV Diastolic Pressure 6 mmHg  LV EDP 13 mmHg  AOp Systolic Pressure 737 mmHg  AOp Diastolic Pressure 74 mmHg  AOp Mean Pressure 90 mmHg  LVp Systolic Pressure 106 mmHg  LVp Diastolic Pressure 4 mmHg  LVp EDP Pressure 15 mmHg  QP/QS 1  TPVR Index 8.03 HRUI  TSVR Index 31.02 HRUI  PVR SVR Ratio 0.03  TPVR/TSVR Ratio 0.26     Assessment/Plan:  This 86 year old gentleman presents with stage D, severe, symptomatic low flow, low gradient aortic stenosis with New York Heart Association class IV symptoms of congestive heart failure with shortness of breath and fatigue at rest and massive volume overload.  He has responded well to diuresis of -14 L and feels much better.  I have personally reviewed his 2D echocardiogram and cardiac catheterization.  His echocardiogram shows a thickened and calcified aortic valve with restricted leaflet mobility.  The mean gradient was measured at 24 mmHg with a dimensionless index of 0.26 and a stroke-volume index is 21.  His left ventricular ejection fraction has decreased to 30 to 35% with global hypokinesis from 40 to 45% in June 2021.  Cardiac catheterization shows severe three-vessel coronary disease with calcified proximal coronary arteries in the area of stenoses.  His left circumflex and right arteries are occluded with  collaterals to the distal vessels.  I agree the best treatment for this patient is coronary bypass graft surgery and aortic valve replacement for improvement of his symptoms and to prevent progressive left ventricular dysfunction and further congestive heart failure.  His operative risk is increased due to his age but he is still active and I think will benefit from surgery. I discussed the operative procedure with the patient including alternatives, benefits and risks; including but not limited to bleeding, blood transfusion, infection, stroke, myocardial infarction, graft failure, heart block requiring a permanent pacemaker, organ dysfunction, and death.  [name] [name] understands and agrees to proceed.  He would like to go home for Thanksgiving and we will plan to do surgery next Tuesday 05/27/2020.  I spent 60 minutes performing this consultation and > 50% of this time was spent face to face counseling and coordinating the care of this patient's severe multivessel coronary disease and severe aortic stenosis.  Gaye Pollack 05/20/2020, 3:46 PM

## 2020-05-20 NOTE — Consult Note (Addendum)
Structural Heart Team Consultation:   Patient ID: [name] [name] MRN: [id number]; DOB: 04-20-1928  Admit date: 05/16/2020 Date of Consult: 05/20/2020  Primary Care Provider: [name], MD [facility] Cardiologist: Peter Martinique, MD  [facility] Electrophysiologist:  None    History of Present Illness:   [name] [name] is a 86 yo male with history of venous insufficiency with chronic lower extremity edema, HTN, hyperlipidemia, peripheral neuropathy, essential tremor, prostate cancer, chronic diastolic heart failure and severe aortic stenosis who I am asked to see today to discuss potential TAVR. He has been followed in our office since 2014 by Dr. Martinique. He has chronic lower extremity edema with no prior DVT. He was treated for a pneumonia in April 2021 and was negative for Covid-19. He was also found to have a pleural effusion and was started on Lasix. Echo in June 2021 with LVEF=40-45%, global hypokinesis with severe hypokinesis of the entire inferior wall and inferoseptal wall, moderate mitral regurgitation and aortic stenosis which was felt to be mild (mean gradient 20 mmHg, AVA 1.38 cm2, dimensionless index 0.33). He was seen in September 2021 with worsening CHF with weight gain, edema and progressive fatigue. His Lasix dose was increased. Echo 04/01/20 with LVEF=30-35% with continued global hypokinesis. Mildly reduced RV systolic function. Moderate mitral valve regurgitation. The aortic valve leaflets are thickened and calcified with mean gradient of 24 mmHg, AVA 1.14 cm2 and dimensionless index 0.25. This is most likely consistent with low flow, low gradient aortic stenosis. He was seen in the office 05/15/20 by Dr. Martinique and reported 11 lb weight gain over the prior three weeks with progressive dyspnea and lower extremity edema. He has no energy. He was admitted the following day for diuresis with IV Lasix. He has diuresed well with IV Lasix over the past 4 days and is now net 14.3  liters negative since admission. Cardiac cath today showed severe three vessel CAD with heavily calcification in all three vessels. The mid LAD has severe disease. The Circumflex and RCA are both chronically occluded. Severe disease in a moderate caliber OM branch.   He tells me today that he feels much better after diuresis. No chest pain or SOB today.    Past Medical History:  Diagnosis Date  . Aortic stenosis   . Arthritis   . CAD (coronary artery disease)   . Colitis   . Edema   . Essential hypertension    no med now  . Essential tremor   . Hyperlipidemia   . Hypotonic bladder    L4-L5  . Obstructive uropathy   . Peripheral neuropathy    Elevated by Dr. love 2010  . Prostate cancer ([place]) 2005    Past Surgical History:  Procedure Laterality Date  . ACHILLES TENDON SURGERY Bilateral 1995  . COLONOSCOPY     With polyp resection  . REVERSE SHOULDER ARTHROPLASTY Right 06/04/2016  . REVERSE SHOULDER ARTHROPLASTY Right 06/04/2016   Procedure: REVERSE SHOULDER ARTHROPLASTY;  Surgeon: Netta Cedars, MD;  Location: [facility];  Service: Orthopedics;  Laterality: Right;  . SKIN CANCER EXCISION  1974   On chest wall  . TOTAL KNEE ARTHROPLASTY Right 2011  . TOTAL KNEE ARTHROPLASTY Left 04/09/2013   Procedure: LEFT TOTAL KNEE ARTHROPLASTY;  Surgeon: Gearlean Alf, MD;  Location: [facility];  Service: Orthopedics;  Laterality: Left;     Home Medications:  Prior to Admission medications   Medication Sig Start Date End Date Taking? Authorizing Provider  aspirin 81 MG tablet Take  81 mg by mouth at bedtime.    Yes [provider]  dutasteride (AVODART) 0.5 MG capsule Take 0.5 mg by mouth daily. 01/28/20  Yes [provider]  furosemide (LASIX) 40 MG tablet Take 1 tablet (40 mg total) by mouth 2 (two) times daily. 04/22/20  Yes Martinique, Peter M, MD  losartan (COZAAR) 25 MG tablet Take 25 mg by mouth daily.   Yes [provider]  Multiple Vitamins-Minerals (CENTRUM SILVER  PO) Take 1 tablet by mouth daily.    Yes [provider]  sulfaSALAzine (AZULFIDINE) 500 MG tablet Take 2,000 mg by mouth daily.    Yes [provider]  tamsulosin (FLOMAX) 0.4 MG CAPS Take 0.4 mg by mouth 2 (two) times daily.    Yes [provider]    Inpatient Medications: Scheduled Meds: . aspirin EC  81 mg Oral QHS  . dutasteride  0.5 mg Oral Daily  . [START ON 05/21/2020] enoxaparin (LOVENOX) injection  40 mg Subcutaneous Q24H  . furosemide  40 mg Oral BID  . sodium chloride flush  3 mL Intravenous Q12H  . sodium chloride flush  3 mL Intravenous Q12H  . sulfaSALAzine  2,000 mg Oral Daily  . tamsulosin  0.4 mg Oral BID   Continuous Infusions: . sodium chloride     PRN Meds: sodium chloride, acetaminophen, melatonin, nitroGLYCERIN, ondansetron (ZOFRAN) IV, sodium chloride flush  Allergies:    Allergies  Allergen Reactions  . Simvastatin Other (See Comments)    "caused neuropathy pain"    Social History:   Social History   Socioeconomic History  . Marital status: Married    Spouse name: Not on file  . Number of children: Not on file  . Years of education: Not on file  . Highest education level: Not on file  Occupational History  . Not on file  Tobacco Use  . Smoking status: Never Smoker  . Smokeless tobacco: Never Used  Vaping Use  . Vaping Use: Never used  Substance and Sexual Activity  . Alcohol use: Yes    Alcohol/week: 1.0 standard drink    Types: 1 Glasses of wine per week    Comment: occ  . Drug use: No  . Sexual activity: Yes  Other Topics Concern  . Not on file  Social History Narrative  . Not on file   Social Determinants of Health   Financial Resource Strain:   . Difficulty of Paying Living Expenses: Not on file  Food Insecurity:   . Worried About Charity fundraiser in the Last Year: Not on file  . Ran Out of Food in the Last Year: Not on file  Transportation Needs:   . Lack of Transportation (Medical): Not on  file  . Lack of Transportation (Non-Medical): Not on file  Physical Activity:   . Days of Exercise per Week: Not on file  . Minutes of Exercise per Session: Not on file  Stress:   . Feeling of Stress : Not on file  Social Connections:   . Frequency of Communication with Friends and Family: Not on file  . Frequency of Social Gatherings with Friends and Family: Not on file  . Attends Religious Services: Not on file  . Active Member of Clubs or Organizations: Not on file  . Attends Archivist Meetings: Not on file  . Marital Status: Not on file  Intimate Partner Violence:   . Fear of Current or Ex-Partner: Not on file  . Emotionally Abused: Not  on file  . Physically Abused: Not on file  . Sexually Abused: Not on file    Family History:   Family History  Problem Relation Age of Onset  . Hypertension Mother   . Heart failure Mother   . Diabetes Mother   . Heart attack Brother   . Heart attack Maternal Aunt      ROS:  Please see the history of present illness.  All other ROS reviewed and negative.     Physical Exam/Data:   Vitals:   05/20/20 0949 05/20/20 0954 05/20/20 0959 05/20/20 1003  BP: 114/76 116/78 105/71 110/82  Pulse: 77 69 80 78  Resp: 15 (!) 21 (!) 21 19  Temp:      TempSrc:      SpO2: 94% 94% 98% (!) 0%  Weight:        Intake/Output Summary (Last 24 hours) at 05/20/2020 1159 Last data filed at 05/19/2020 2200 Gross per 24 hour  Intake 360 ml  Output 3200 ml  Net -2840 ml   Last 3 Weights 05/20/2020 05/19/2020 05/18/2020  Weight (lbs) 169 lb 6.4 oz 170 lb 12.8 oz 174 lb 1.6 oz  Weight (kg) 76.839 kg 77.474 kg 78.971 kg     Body mass index is 25.02 kg/m.  General:  Well nourished, well developed, in no acute distress HEENT: normal Lymph: no adenopathy Neck: no JVD Endocrine:  No thryomegaly Vascular: No carotid bruits; FA pulses 2+ bilaterally without bruits  Cardiac:  normal S1, S2; RRR; systolic murmur Lungs:  clear to auscultation  bilaterally, no wheezing, rhonchi or rales  Abd: soft, nontender, no hepatomegaly  Ext: no edema Musculoskeletal:  No deformities, BUE and BLE strength normal and equal Skin: warm and dry  Neuro:  CNs 2-12 intact, no focal abnormalities noted Psych:  Normal affect   EKG:  The EKG was personally reviewed and demonstrates:  Sinus with 1st degree AV block, PACs, possible old inferior MI Telemetry:  Telemetry was personally reviewed and demonstrates:  sinus  Relevant CV Studies:  Echo 04/01/20: 1. LVEF has decreased from 12/10/2019, now 30-35% with LV dyssynchrony.  Aortic stenosis is most probably severe with low flow low gradient,  dimensionless index 0.26.  2. Left ventricular ejection fraction, by estimation, is 30 to 35%. The  left ventricle has moderately decreased function. The left ventricle  demonstrates global hypokinesis. The left ventricular internal cavity size  was mildly dilated. There is severe  asymmetric left ventricular hypertrophy of the basal-septal segment. Left  ventricular diastolic function could not be evaluated.  3. Right ventricular systolic function is mildly reduced. The right  ventricular size is mildly enlarged. There is moderately elevated  pulmonary artery systolic pressure. The estimated right ventricular  systolic pressure is 95.2 mmHg.  4. Left atrial size was severely dilated.  5. Right atrial size was moderately dilated.  6. The mitral valve is normal in structure. Moderate mitral valve  regurgitation. No evidence of mitral stenosis.  7. The aortic valve is normal in structure. Aortic valve regurgitation is  not visualized. Moderate to severe aortic valve stenosis. Aortic valve  mean gradient measures 24.0 mmHg.  8. Aortic dilatation noted. There is mild to moderate dilatation of the  ascending aorta, measuring 44 mm.  9. The inferior vena cava is dilated in size with <50% respiratory  variability, suggesting right atrial pressure of 15  mmHg.   FINDINGS  Left Ventricle: Left ventricular ejection fraction, by estimation, is 30  to 35%. The left ventricle  has moderately decreased function. The left  ventricle demonstrates global hypokinesis. The left ventricular internal  cavity size was mildly dilated.  There is severe asymmetric left ventricular hypertrophy of the  basal-septal segment. Abnormal (paradoxical) septal motion, consistent  with left bundle branch block. Left ventricular diastolic function could  not be evaluated due to atrial fibrillation.  Left ventricular diastolic function could not be evaluated.   Right Ventricle: The right ventricular size is mildly enlarged. No  increase in right ventricular wall thickness. Right ventricular systolic  function is mildly reduced. There is moderately elevated pulmonary artery  systolic pressure. The tricuspid  regurgitant velocity is 3.17 m/s, and with an assumed right atrial  pressure of 15 mmHg, the estimated right ventricular systolic pressure is  70.3 mmHg.   Left Atrium: Left atrial size was severely dilated.   Right Atrium: Right atrial size was moderately dilated.   Pericardium: There is no evidence of pericardial effusion.   Mitral Valve: The mitral valve is normal in structure. Moderate mitral  valve regurgitation. No evidence of mitral valve stenosis.   Tricuspid Valve: The tricuspid valve is normal in structure. Tricuspid  valve regurgitation is mild . No evidence of tricuspid stenosis.   Aortic Valve: The aortic valve is normal in structure. Aortic valve  regurgitation is not visualized. Moderate to severe aortic stenosis is  present. Aortic valve mean gradient measures 24.0 mmHg. Aortic valve peak  gradient measures 19.9 mmHg. Aortic valve  area, by VTI measures 1.14 cm.   Pulmonic Valve: The pulmonic valve was normal in structure. Pulmonic valve  regurgitation is not visualized. No evidence of pulmonic stenosis.   Aorta: The aortic root is  normal in size and structure and aortic  dilatation noted. There is mild to moderate dilatation of the ascending  aorta, measuring 44 mm.   Venous: The inferior vena cava is dilated in size with less than 50%  respiratory variability, suggesting right atrial pressure of 15 mmHg.   IAS/Shunts: No atrial level shunt detected by color flow Doppler.     LEFT VENTRICLE  PLAX 2D  LVIDd:     4.80 cm Diastology  LVIDs:     4.50 cm LV e' medial:  6.09 cm/s  LV PW:     1.30 cm LV E/e' medial: 17.7  LV IVS:    1.80 cm LV e' lateral:  11.20 cm/s  LVOT diam:   2.40 cm LV E/e' lateral: 9.6  LV SV:     41  LV SV Index:  21  LVOT Area:   4.52 cm     RIGHT VENTRICLE       IVC  RV Basal diam: 5.30 cm   IVC diam: 2.30 cm  RV S prime:   10.47 cm/s  TAPSE (M-mode): 1.3 cm   LEFT ATRIUM       Index    RIGHT ATRIUM      Index  LA diam:    5.30 cm 2.69 cm/m RA Area:   25.40 cm  LA Vol (A2C):  124.0 ml 63.01 ml/m RA Volume:  89.70 ml 45.58 ml/m  LA Vol (A4C):  128.0 ml 65.04 ml/m  LA Biplane Vol: 127.0 ml 64.53 ml/m  AORTIC VALVE  AV Area (Vmax):  1.31 cm  AV Area (Vmean):  1.15 cm  AV Area (VTI):   1.14 cm  AV Vmax:      223.00 cm/s  AV Vmean:     182.800 cm/s  AV VTI:  0.363 m  AV Peak Grad:   19.9 mmHg  AV Mean Grad:   24.0 mmHg  LVOT Vmax:     64.60 cm/s  LVOT Vmean:    46.300 cm/s  LVOT VTI:     0.091 m  LVOT/AV VTI ratio: 0.25    AORTA  Ao Root diam: 4.10 cm  Ao Asc diam: 4.40 cm   MITRAL VALVE        TRICUSPID VALVE  MV Area (PHT): 4.39 cm   TR Peak grad:  40.2 mmHg  MV Decel Time: 173 msec   TR Vmax:    317.00 cm/s  MR Peak grad: 109.4 mmHg  MR Mean grad: 72.0 mmHg   SHUNTS  MR Vmax:   523.00 cm/s  Systemic VTI: 0.09 m  MR Vmean:   400.0 cm/s  Systemic Diam: 2.40 cm  MV E velocity: 108.05 cm/s  MV A velocity: 57.40  cm/s  MV E/A ratio: 1.88   Cardiac cath 05/20/20:  Mid RCA lesion is 90% stenosed.  Mid RCA to Dist RCA lesion is 100% stenosed.  Prox LAD to Mid LAD lesion is 85% stenosed.  1st Mrg lesion is 95% stenosed.  Prox Cx to Mid Cx lesion is 100% stenosed.  LV end diastolic pressure is mildly elevated.  Hemodynamic findings consistent with mild pulmonary hypertension.  2nd Diag lesion is 100% stenosed.   1. Severe complex 3 vessel obstructive CAD.  2. Low flow Aortic stenosis. The valve is severely calcified. By cath mean gradient is 14 mm Hg.  3. Mildly elevated LV filling pressures 4. Mild pulmonary HTN with mean PA pressure 22 mmHg 5. Cardiac index 2.74.  Diagnostic Dominance: Right Left Anterior Descending  The vessel originates from a separate ostium.  Prox LAD to Mid LAD lesion is 85% stenosed. The lesion is moderately calcified. The LAD is aneurysmal just distal to the stenosis  Second Diagonal Branch  Collaterals  2nd Diag filled by collaterals from 1st Diag.    2nd Diag lesion is 100% stenosed.  Left Circumflex  The vessel originates from a separate ostium.  Prox Cx to Mid Cx lesion is 100% stenosed. The lesion is severely calcified.  First Obtuse Marginal Branch  1st Mrg lesion is 95% stenosed. The lesion is segmental and irregular. The lesion is severely calcified.  Second Obtuse Marginal Branch  Collaterals  2nd Mrg filled by collaterals from 1st Mrg.    Right Coronary Artery  Vessel was injected. Vessel is large. The vessel is severely calcified. The vessel is moderately tortuous.  Mid RCA lesion is 90% stenosed.  Mid RCA to Dist RCA lesion is 100% stenosed.  Right Posterior Descending Artery  Collaterals  RPDA filled by collaterals from Dist LAD.    Intervention  No interventions have been documented. Right Heart  Right Heart Pressures Hemodynamic findings consistent with mild pulmonary hypertension.  Left Heart  Left Ventricle LV end diastolic  pressure is mildly elevated.  Coronary Diagrams  Diagnostic Dominance: Right  Intervention  Implants   No implant documentation for this case.  Syngo Images  Show images for CARDIAC CATHETERIZATION Images on Long Term Storage  Show images for Zebulan, Hinshaw to Procedure Log  Procedure Log    Hemo Data   Most Recent Value  Fick Cardiac Output 5.28 L/min  Fick Cardiac Output Index 2.74 (L/min)/BSA  Aortic Mean Gradient 13.88 mmHg  Aortic Peak Gradient 9 mmHg  Aortic Valve Area 1.95  Aortic Value Area Index 1.01 cm2/BSA  RA  A Wave 8 mmHg  RA V Wave 9 mmHg  RA Mean 6 mmHg  RV Systolic Pressure 36 mmHg  RV Diastolic Pressure 4 mmHg  RV EDP 7 mmHg  PA Systolic Pressure 35 mmHg  PA Diastolic Pressure 13 mmHg  PA Mean 22 mmHg  PW A Wave 27 mmHg  PW V Wave 25 mmHg  PW Mean 20 mmHg  AO Systolic Pressure 789 mmHg  AO Diastolic Pressure 69 mmHg  AO Mean 85 mmHg  LV Systolic Pressure 381 mmHg  LV Diastolic Pressure 6 mmHg  LV EDP 13 mmHg  AOp Systolic Pressure 017 mmHg  AOp Diastolic Pressure 74 mmHg  AOp Mean Pressure 90 mmHg  LVp Systolic Pressure 510 mmHg  LVp Diastolic Pressure 4 mmHg  LVp EDP Pressure 15 mmHg  QP/QS 1  TPVR Index 8.03 HRUI  TSVR Index 31.02 HRUI  PVR SVR Ratio 0.03  TPVR/TSVR Ratio 0.26     Laboratory Data:  High Sensitivity Troponin:  No results for input(s): TROPONINIHS in the last 720 hours.   Chemistry Recent Labs  Lab 05/18/20 0835 05/18/20 0835 05/19/20 1032 05/20/20 0340 05/20/20 0925  NA 136   < > 138 136 138  K 3.9   < > 3.7 3.8 3.7  CL 99  --  100 100  --   CO2 24  --  27 25  --   GLUCOSE 175*  --  106* 101*  --   BUN 29*  --  28* 29*  --   CREATININE 1.51*  --  1.44* 1.42*  --   CALCIUM 9.0  --  9.4 9.0  --   GFRNONAA 44*  --  46* 47*  --   ANIONGAP 13  --  11 11  --    < > = values in this interval not displayed.    Recent Labs  Lab 05/16/20 1736  PROT 6.6  ALBUMIN 3.5  AST 39  ALT 25  ALKPHOS 67   BILITOT 0.5   Hematology Recent Labs  Lab 05/16/20 1736 05/20/20 0925  WBC 5.2  --   RBC 3.93*  --   HGB 12.3* 12.9*  HCT 37.2* 38.0*  MCV 94.7  --   MCH 31.3  --   MCHC 33.1  --   RDW 14.1  --   PLT 141*  --    BNP Recent Labs  Lab 05/16/20 1736  BNP 2,005.2*    Radiology/Studies:  CARDIAC CATHETERIZATION  Addendum Date: 05/20/2020    Mid RCA lesion is 90% stenosed.  Mid RCA to Dist RCA lesion is 100% stenosed.  Prox LAD to Mid LAD lesion is 85% stenosed.  1st Mrg lesion is 95% stenosed.  Prox Cx to Mid Cx lesion is 100% stenosed.  LV end diastolic pressure is mildly elevated.  Hemodynamic findings consistent with mild pulmonary hypertension.  2nd Diag lesion is 100% stenosed.  1. Severe complex 3 vessel obstructive CAD. 2. Low flow Aortic stenosis. The valve is severely calcified. By cath mean gradient is 14 mm Hg. 3. Mildly elevated LV filling pressures 4. Mild pulmonary HTN with mean PA pressure 22 mmHg 5. Cardiac index 2.74. Plan: surgical consultation for CABG/AVR. Will resume lasix 40 mg po bid.   Result Date: 05/20/2020  Mid RCA lesion is 90% stenosed.  Mid RCA to Dist RCA lesion is 100% stenosed.  Prox LAD to Mid LAD lesion is 85% stenosed.  1st Mrg lesion is 95% stenosed.  Prox Cx to Mid Cx lesion  is 100% stenosed.  LV end diastolic pressure is mildly elevated.  Hemodynamic findings consistent with mild pulmonary hypertension.  1. Severe complex 3 vessel obstructive CAD. 2. Low flow Aortic stenosis. The valve is severely calcified. By cath mean gradient is 14 mm Hg. 3. Mildly elevated LV filling pressures 4. Mild pulmonary HTN with mean PA pressure 22 mmHg 5. Cardiac index 2.74. Plan: surgical consultation for CABG/AVR. Will resume lasix 40 mg po bid.     Assessment and Plan:   1. Severe low flow,low gradient aortic stenosis: He has severe, stage D2 aortic valve stenosis (low flow, low gradient). I have personally reviewed the echo and the cath images.  The aortic valve is thickened, calcified with limited leaflet mobility. I think he would benefit from AVR. I would consider him a candidate for TAVR, however, given his complex CAD, I think it is reasonable to have him evaluated by CT surgery to discuss CABG/surgical AVR. He is an active 86 year old male having volunteered and worked up until the pandemic last year.    I have reviewed the natural history of aortic stenosis with the patient and their family members  who are present today. We have discussed the limitations of medical therapy and the poor prognosis associated with symptomatic aortic stenosis. We have reviewed potential treatment options, including palliative medical therapy, conventional surgical aortic valve replacement, and transcatheter aortic valve replacement. We discussed treatment options in the context of the patient's specific comorbid medical conditions.   He will be seen later today by Dr. Cyndia Bent to review options for CABG/surgical AVR. If he is not felt to be an operative candidate, we would need to address his CAD with PCI  And then continue TAVR workup with CT scans.    For questions or updates, please contact [place] Please consult [email or web address] for contact info under    Signed, Lauree Chandler, MD  05/20/2020 11:59 AM

## 2020-05-20 NOTE — Progress Notes (Signed)
Pre CABG vascular testing complete.  Please see CV Proc tab for preliminary results. [place], RVT 5:35 PM  05/20/2020

## 2020-05-20 NOTE — Interval H&P Note (Signed)
History and Physical Interval Note:  05/20/2020 8:55 AM  [name] [name]  has presented today for surgery, with the diagnosis of aortic stenosis.  The various methods of treatment have been discussed with the patient and family. After consideration of risks, benefits and other options for treatment, the patient has consented to  Procedure(s): RIGHT/LEFT HEART CATH AND CORONARY ANGIOGRAPHY (N/A) as a surgical intervention.  The patient's history has been reviewed, patient examined, no change in status, stable for surgery.  I have reviewed the patient's chart and labs.  Questions were answered to the patient's satisfaction.   Cath Lab Visit (complete for each Cath Lab visit)  Clinical Evaluation Leading to the Procedure:   ACS: No.  Non-ACS:    Anginal Classification: CCS II  Anti-ischemic medical therapy: No Therapy  Non-Invasive Test Results: No non-invasive testing performed  Prior CABG: No previous CABG        [facility] 05/20/2020 8:56 AM

## 2020-05-20 NOTE — Progress Notes (Signed)
  ReDS Clip Diuretic Study Pt study # [id number]  Your patient has been enrolled in the ReDS Clip Diuretic Study  Severe LEE bilaterally continues to improve (L>R). Encouraged him to elevate legs. SCr trending down to 1.42. On lasix IV 40 BID currently. Uop -3.2 L yesterday (net -14 L today). Received only 1 dose IV lasix today. Going for [facility] today.  Changes to prescribed diuretics recommended:  At [facility] - RA mean 6, PWCP 25 on cath - plan to continue lasix 40 mg PO BID. Will get ReDs reading tomorrow and adjust therapy accordingly.  Provider contacted: Reino Bellis Recommendation was accepted by provider.    REDS Clip  READING= Not collected given in cardiac cath (goal 25-35%)  CHEST RULER = 29 Clip Station = C   Orthodema score = 1 Signs/Symptoms Score   Mild edema, no orthopnea 0 No congestion  Moderate edema, no orthopnea 1 Low-grade orthodema/congestion  Severe edema OR orthopnea 2   Moderate edema and orthopnea 3 High-grade orthodema/congestion  Severe edema AND orthopnea 4    Antonietta Jewel, PharmD, BCCCP Clinical Pharmacist  Phone: [phone] 05/20/2020 9:35 AM  Please check AMION for all [place] phone numbers After 10:00 PM, call [place] [phone]

## 2020-05-21 DIAGNOSIS — I350 Nonrheumatic aortic (valve) stenosis: Secondary | ICD-10-CM | POA: Diagnosis not present

## 2020-05-21 DIAGNOSIS — I25110 Atherosclerotic heart disease of native coronary artery with unstable angina pectoris: Secondary | ICD-10-CM | POA: Diagnosis not present

## 2020-05-21 DIAGNOSIS — I5043 Acute on chronic combined systolic (congestive) and diastolic (congestive) heart failure: Secondary | ICD-10-CM | POA: Diagnosis not present

## 2020-05-21 DIAGNOSIS — N1832 Chronic kidney disease, stage 3b: Secondary | ICD-10-CM | POA: Diagnosis not present

## 2020-05-21 DIAGNOSIS — I25118 Atherosclerotic heart disease of native coronary artery with other forms of angina pectoris: Secondary | ICD-10-CM | POA: Diagnosis not present

## 2020-05-21 LAB — BASIC METABOLIC PANEL
Anion gap: 12 (ref 5–15)
BUN: 28 mg/dL — ABNORMAL HIGH (ref 8–23)
CO2: 25 mmol/L (ref 22–32)
Calcium: 9.1 mg/dL (ref 8.9–10.3)
Chloride: 101 mmol/L (ref 98–111)
Creatinine, Ser: 1.4 mg/dL — ABNORMAL HIGH (ref 0.61–1.24)
GFR, Estimated: 48 mL/min — ABNORMAL LOW (ref >60)
Glucose, Bld: 96 mg/dL (ref 70–99)
Potassium: 4.1 mmol/L (ref 3.5–5.1)
Sodium: 138 mmol/L (ref 135–145)

## 2020-05-21 MED ORDER — TRANEXAMIC ACID (OHS) PUMP PRIME SOLUTION
2.0000 mg/kg | INTRAVENOUS | Status: DC
  Filled 2020-05-21: qty 1.52

## 2020-05-21 MED ORDER — MAGNESIUM SULFATE 50 % IJ SOLN
40.0000 meq | INTRAMUSCULAR | Status: DC
  Filled 2020-05-21: qty 9.85

## 2020-05-21 MED ORDER — SODIUM CHLORIDE 0.9 % IV SOLN
1.5000 g | INTRAVENOUS | Status: AC
  Administered 2020-05-23: 1.5 g via INTRAVENOUS
  Filled 2020-05-21: qty 1.5

## 2020-05-21 MED ORDER — MILRINONE LACTATE IN DEXTROSE 20-5 MG/100ML-% IV SOLN
0.3000 ug/kg/min | INTRAVENOUS | Status: AC
  Administered 2020-05-23: .25 ug/kg/min via INTRAVENOUS
  Filled 2020-05-21: qty 100

## 2020-05-21 MED ORDER — VANCOMYCIN HCL 1250 MG/250ML IV SOLN
1250.0000 mg | INTRAVENOUS | Status: AC
  Administered 2020-05-23: 1250 mg via INTRAVENOUS
  Filled 2020-05-21: qty 250

## 2020-05-21 MED ORDER — POLYETHYLENE GLYCOL 3350 17 G PO PACK: 17.0000 g | PACK | Freq: Every day | ORAL | Status: DC | PRN

## 2020-05-21 MED ORDER — SENNA 8.6 MG PO TABS
1.0000 | ORAL_TABLET | Freq: Two times a day (BID) | ORAL | Status: DC | PRN
  Administered 2020-05-21: 8.6 mg via ORAL
  Filled 2020-05-21: qty 1

## 2020-05-21 MED ORDER — PHENYLEPHRINE HCL-NACL 20-0.9 MG/250ML-% IV SOLN
30.0000 ug/min | INTRAVENOUS | Status: AC
  Administered 2020-05-23: 100 ug/min via INTRAVENOUS
  Administered 2020-05-23: 50 ug/min via INTRAVENOUS
  Filled 2020-05-21: qty 250

## 2020-05-21 MED ORDER — SODIUM CHLORIDE 0.9 % IV SOLN
750.0000 mg | INTRAVENOUS | Status: AC
  Administered 2020-05-23: 750 mg via INTRAVENOUS
  Filled 2020-05-21: qty 750

## 2020-05-21 MED ORDER — PLASMA-LYTE 148 IV SOLN
INTRAVENOUS | Status: DC
  Filled 2020-05-21: qty 2.5

## 2020-05-21 MED ORDER — ROSUVASTATIN CALCIUM 5 MG PO TABS
5.0000 mg | ORAL_TABLET | Freq: Every day | ORAL | Status: DC
  Administered 2020-05-21 – 2020-05-22 (×2): 5 mg via ORAL
  Filled 2020-05-21 (×2): qty 1

## 2020-05-21 MED ORDER — DEXMEDETOMIDINE HCL IN NACL 400 MCG/100ML IV SOLN
0.1000 ug/kg/h | INTRAVENOUS | Status: AC
  Administered 2020-05-23: .3 ug/kg/h via INTRAVENOUS
  Filled 2020-05-21: qty 100

## 2020-05-21 MED ORDER — POTASSIUM CHLORIDE 2 MEQ/ML IV SOLN
80.0000 meq | INTRAVENOUS | Status: DC
  Filled 2020-05-21: qty 40

## 2020-05-21 MED ORDER — SODIUM CHLORIDE 0.9 % IV SOLN
INTRAVENOUS | Status: DC
  Filled 2020-05-21: qty 30

## 2020-05-21 MED ORDER — TRANEXAMIC ACID (OHS) BOLUS VIA INFUSION
15.0000 mg/kg | INTRAVENOUS | Status: AC
  Administered 2020-05-23: 1143 mg via INTRAVENOUS
  Filled 2020-05-21: qty 1143

## 2020-05-21 MED ORDER — NITROGLYCERIN IN D5W 200-5 MCG/ML-% IV SOLN
2.0000 ug/min | INTRAVENOUS | Status: AC
  Administered 2020-05-23: 5 ug/min via INTRAVENOUS
  Filled 2020-05-21: qty 250

## 2020-05-21 MED ORDER — INSULIN REGULAR(HUMAN) IN NACL 100-0.9 UT/100ML-% IV SOLN
INTRAVENOUS | Status: AC
  Administered 2020-05-23: 1.3 [IU]/h via INTRAVENOUS
  Filled 2020-05-21: qty 100

## 2020-05-21 MED ORDER — EPINEPHRINE HCL 5 MG/250ML IV SOLN IN NS
0.0000 ug/min | INTRAVENOUS | Status: DC
  Filled 2020-05-21: qty 250

## 2020-05-21 MED ORDER — TRANEXAMIC ACID 1000 MG/10ML IV SOLN
1.5000 mg/kg/h | INTRAVENOUS | Status: AC
  Administered 2020-05-23: 1.5 mg/kg/h via INTRAVENOUS
  Filled 2020-05-21: qty 25

## 2020-05-21 MED ORDER — NOREPINEPHRINE 4 MG/250ML-% IV SOLN
0.0000 ug/min | INTRAVENOUS | Status: DC
  Filled 2020-05-21: qty 250

## 2020-05-21 NOTE — Progress Notes (Signed)
  ReDS Clip Diuretic Study Pt study # [id number]  Your patient has been enrolled in the ReDS Clip Diuretic Study  SCr trending down to 1.40. Transitioned to PO lasix yesterday. Uop -1.8 L yesterday (net -20L today). RHC yesterday with RA mean 6, PCWP 25.  Discussed with cardiology - since plan changed from discharge today, now staying and getting CABG, will complete his participation in ReDS study since hospitalization period moving forward will be primarily for CAD.  Changes to prescribed diuretics recommended:  Continue PO lasix 40 mg BID  Provider contacted: Reino Bellis Recommendation was accepted by provider.   REDS Clip  READING= 36%  CHEST RULER = 29 Clip Station = C   Orthodema score = 0 Signs/Symptoms Score   Mild edema, no orthopnea 0 No congestion  Moderate edema, no orthopnea 1 Low-grade orthodema/congestion  Severe edema OR orthopnea 2   Moderate edema and orthopnea 3 High-grade orthodema/congestion  Severe edema AND orthopnea 4    Kerby Nora, PharmD, BCPS Heart Failure Stewardship Pharmacist Phone [phone]  Please check [email or web address] for unit-specific pharmacist phone numbers

## 2020-05-21 NOTE — Progress Notes (Signed)
[id number] Gave pt OHS booklet, care guide, and in the tube handout. Wrote down how to view pre op video. Wife present for ed. Discussed staying in the tube and sternal precautions. Pt stated he has only been walking short distances due to fatigue. Gave IS and pt only able to reach 250-500 ml. Encouraged him to use. Wife had questions re CRP 2. Discussed referring after surgery and evaluating his walking. Pt may need PT. Discussed with pt that pt usually here 5 to 6 days after surgery as pt under impression it is 2 days.  Wife aware pt will need someone with him after discharge. Will follow up after surgery. Graylon Good RN BSN 05/21/2020 11:20 AM

## 2020-05-21 NOTE — Progress Notes (Addendum)
Progress Note  Patient Name: [name] [name] Date of Encounter: 05/21/2020  [facility] Cardiologist: Peter Martinique, MD   Subjective   Feels tired, up walking around in the room. Had a BM this morning. Wants to stay inpatient until surgery.   Inpatient Medications    Scheduled Meds: . aspirin EC  81 mg Oral QHS  . dutasteride  0.5 mg Oral Daily  . enoxaparin (LOVENOX) injection  40 mg Subcutaneous Q24H  . furosemide  40 mg Oral BID  . sodium chloride flush  3 mL Intravenous Q12H  . sodium chloride flush  3 mL Intravenous Q12H  . sulfaSALAzine  2,000 mg Oral Daily  . tamsulosin  0.4 mg Oral BID   Continuous Infusions: . sodium chloride     PRN Meds: sodium chloride, acetaminophen, melatonin, nitroGLYCERIN, ondansetron (ZOFRAN) IV, sodium chloride flush   Vital Signs    Vitals:   05/20/20 1353 05/20/20 1400 05/20/20 2122 05/21/20 0528  BP: 93/68 98/68 115/85 106/85  Pulse: 75 75 86 77  Resp:  18 18 15   Temp:   97.7 F (36.5 C) (!) 97.3 F (36.3 C)  TempSrc:   Oral Oral  SpO2: 96%  98% 95%  Weight:    76.2 kg    Intake/Output Summary (Last 24 hours) at 05/21/2020 1015 Last data filed at 05/21/2020 0900 Gross per 24 hour  Intake 1358 ml  Output 1800 ml  Net -442 ml   Last 3 Weights 05/21/2020 05/20/2020 05/19/2020  Weight (lbs) 168 lb 169 lb 6.4 oz 170 lb 12.8 oz  Weight (kg) 76.204 kg 76.839 kg 77.474 kg      Telemetry    SR with PVCs - Personally Reviewed  Physical Exam  Pleasant older male GEN: No acute distress.   Neck: No JVD Cardiac: RRR, + harsh systolic murmur, no rubs, or gallops.  Respiratory: Clear to auscultation bilaterally. GI: Soft, nontender, non-distended  MS: No edema; No deformity. Right radial site stable.  Neuro:  Nonfocal  Psych: Normal affect   Labs    High Sensitivity Troponin:  No results for input(s): TROPONINIHS in the last 720 hours.    Chemistry Recent Labs  Lab 05/16/20 1736 05/17/20 0335 05/19/20 1032  05/19/20 1032 05/20/20 0340 05/20/20 0340 05/20/20 0925 05/20/20 0933 05/21/20 0310  NA 137   < > 138   < > 136   < > 138 140 138  K 4.1   < > 3.7   < > 3.8   < > 3.7 3.5 4.1  CL 100   < > 100  --  100  --   --   --  101  CO2 26   < > 27  --  25  --   --   --  25  GLUCOSE 101*   < > 106*  --  101*  --   --   --  96  BUN 26*   < > 28*  --  29*  --   --   --  28*  CREATININE 1.39*   < > 1.44*  --  1.42*  --   --   --  1.40*  CALCIUM 9.2   < > 9.4  --  9.0  --   --   --  9.1  PROT 6.6  --   --   --   --   --   --   --   --   ALBUMIN 3.5  --   --   --   --   --   --   --   --  AST 39  --   --   --   --   --   --   --   --   ALT 25  --   --   --   --   --   --   --   --   ALKPHOS 67  --   --   --   --   --   --   --   --   BILITOT 0.5  --   --   --   --   --   --   --   --   GFRNONAA 48*   < > 46*  --  47*  --   --   --  48*  ANIONGAP 11   < > 11  --  11  --   --   --  12   < > = values in this interval not displayed.     Hematology Recent Labs  Lab 05/16/20 1736 05/20/20 0925 05/20/20 0933  WBC 5.2  --   --   RBC 3.93*  --   --   HGB 12.3* 12.9* 12.6*  HCT 37.2* 38.0* 37.0*  MCV 94.7  --   --   MCH 31.3  --   --   MCHC 33.1  --   --   RDW 14.1  --   --   PLT 141*  --   --     BNP Recent Labs  Lab 05/16/20 1736  BNP 2,005.2*     DDimer No results for input(s): DDIMER in the last 168 hours.   Radiology    CARDIAC CATHETERIZATION  Addendum Date: 05/20/2020    Mid RCA lesion is 90% stenosed.  Mid RCA to Dist RCA lesion is 100% stenosed.  Prox LAD to Mid LAD lesion is 85% stenosed.  1st Mrg lesion is 95% stenosed.  Prox Cx to Mid Cx lesion is 100% stenosed.  LV end diastolic pressure is mildly elevated.  Hemodynamic findings consistent with mild pulmonary hypertension.  2nd Diag lesion is 100% stenosed.  1. Severe complex 3 vessel obstructive CAD. 2. Low flow Aortic stenosis. The valve is severely calcified. By cath mean gradient is 14 mm Hg. 3. Mildly  elevated LV filling pressures 4. Mild pulmonary HTN with mean PA pressure 22 mmHg 5. Cardiac index 2.74. Plan: surgical consultation for CABG/AVR. Will resume lasix 40 mg po bid.   Result Date: 05/20/2020  Mid RCA lesion is 90% stenosed.  Mid RCA to Dist RCA lesion is 100% stenosed.  Prox LAD to Mid LAD lesion is 85% stenosed.  1st Mrg lesion is 95% stenosed.  Prox Cx to Mid Cx lesion is 100% stenosed.  LV end diastolic pressure is mildly elevated.  Hemodynamic findings consistent with mild pulmonary hypertension.  1. Severe complex 3 vessel obstructive CAD. 2. Low flow Aortic stenosis. The valve is severely calcified. By cath mean gradient is 14 mm Hg. 3. Mildly elevated LV filling pressures 4. Mild pulmonary HTN with mean PA pressure 22 mmHg 5. Cardiac index 2.74. Plan: surgical consultation for CABG/AVR. Will resume lasix 40 mg po bid.   VAS US DOPPLER PRE CABG  Result Date: 05/20/2020 PREOPERATIVE VASCULAR EVALUATION  Indications: Pre-CABG. Performing Technologist: Lita Mains RDMS, RVT  Examination Guidelines: A complete evaluation includes B-mode imaging, spectral Doppler, color Doppler, and power Doppler as needed of all accessible portions of each vessel. Bilateral testing is considered an integral part of a complete examination.  Limited examinations for reoccurring indications may be performed as noted.  Right Carotid Findings: +----------+--------+--------+--------+------------+--------+           PSV cm/sEDV cm/sStenosisDescribe    Comments +----------+--------+--------+--------+------------+--------+ CCA Prox  44      21                                   +----------+--------+--------+--------+------------+--------+ CCA Distal41      18              heterogenous         +----------+--------+--------+--------+------------+--------+ ICA Prox  40      20                                   +----------+--------+--------+--------+------------+--------+ ICA Mid   47       18                                   +----------+--------+--------+--------+------------+--------+ ICA Distal28      13                                   +----------+--------+--------+--------+------------+--------+ ECA       51      14                                   +----------+--------+--------+--------+------------+--------+ Portions of this table do not appear on this page. +----------+--------+-------+--------+------------+           PSV cm/sEDV cmsDescribeArm Pressure +----------+--------+-------+--------+------------+ Subclavian79                                  +----------+--------+-------+--------+------------+ +---------+--------+--+--------+-+---------+ VertebralPSV cm/s23EDV cm/s9Antegrade +---------+--------+--+--------+-+---------+ Left Carotid Findings: +----------+--------+--------+--------+--------+--------+           PSV cm/sEDV cm/sStenosisDescribeComments +----------+--------+--------+--------+--------+--------+ CCA Prox  48      21                               +----------+--------+--------+--------+--------+--------+ CCA Distal39      14                               +----------+--------+--------+--------+--------+--------+ ICA Prox  46      22                               +----------+--------+--------+--------+--------+--------+ ICA Mid   49      26                               +----------+--------+--------+--------+--------+--------+ ICA Distal36      18                               +----------+--------+--------+--------+--------+--------+ ECA       64      18                               +----------+--------+--------+--------+--------+--------+ +----------+--------+--------+--------+------------+  SubclavianPSV cm/sEDV cm/sDescribeArm Pressure +----------+--------+--------+--------+------------+           89                                    +----------+--------+--------+--------+------------+ +---------+--------+--+--------+--+---------+ VertebralPSV cm/s31EDV cm/s14Antegrade +---------+--------+--+--------+--+---------+  ABI Findings: +---------+------------------+-----+---------+--------+ Right    Rt Pressure (mmHg)IndexWaveform Comment  +---------+------------------+-----+---------+--------+ Brachial 107                    triphasic         +---------+------------------+-----+---------+--------+ PTA      255               2.38 biphasic          +---------+------------------+-----+---------+--------+ DP       255               2.38 triphasic         +---------+------------------+-----+---------+--------+ Great Toe147               1.37                   +---------+------------------+-----+---------+--------+ +---------+------------------+-----+---------+-------+ Left     Lt Pressure (mmHg)IndexWaveform Comment +---------+------------------+-----+---------+-------+ Brachial 95                     triphasic        +---------+------------------+-----+---------+-------+ PTA      174               1.63 triphasic        +---------+------------------+-----+---------+-------+ DP       255               2.38 biphasic         +---------+------------------+-----+---------+-------+ Great Toe65                0.61                  +---------+------------------+-----+---------+-------+  Right Doppler Findings: +--------+--------+-----+---------+--------+ Site    PressureIndexDoppler  Comments +--------+--------+-----+---------+--------+ [id number]          triphasic         +--------+--------+-----+---------+--------+ Radial               triphasic         +--------+--------+-----+---------+--------+ Ulnar                triphasic         +--------+--------+-----+---------+--------+  Left Doppler Findings: +--------+--------+-----+---------+--------+ Site     PressureIndexDoppler  Comments +--------+--------+-----+---------+--------+ [id number]           triphasic         +--------+--------+-----+---------+--------+ Radial               triphasic         +--------+--------+-----+---------+--------+ Ulnar                triphasic         +--------+--------+-----+---------+--------+  Summary: Right Carotid: Velocities in the right ICA are consistent with a 1-39% stenosis. Left Carotid: Velocities in the left ICA are consistent with a 1-39% stenosis. Vertebrals: Bilateral vertebral arteries demonstrate antegrade flow. Right ABI: ABIs are unreliable. Left ABI: ABIs are unreliable. Right Upper Extremity: Doppler waveforms remain within normal limits with right radial compression. Doppler waveforms decrease >50% with right ulnar compression. Left Upper Extremity: Doppler waveform obliterate with left radial compression. Doppler waveforms remain within normal limits with left  ulnar compression.  Electronically signed by Deitra Mayo MD on 05/20/2020 at 5:56:21 PM.    Final     Cardiac Studies   Cath: 05/20/20   Mid RCA lesion is 90% stenosed.  Mid RCA to Dist RCA lesion is 100% stenosed.  Prox LAD to Mid LAD lesion is 85% stenosed.  1st Mrg lesion is 95% stenosed.  Prox Cx to Mid Cx lesion is 100% stenosed.  LV end diastolic pressure is mildly elevated.  Hemodynamic findings consistent with mild pulmonary hypertension.  2nd Diag lesion is 100% stenosed.   1. Severe complex 3 vessel obstructive CAD.  2. Low flow Aortic stenosis. The valve is severely calcified. By cath mean gradient is 14 mm Hg.  3. Mildly elevated LV filling pressures 4. Mild pulmonary HTN with mean PA pressure 22 mmHg 5. Cardiac index 2.74.  Plan: surgical consultation for CABG/AVR. Will resume lasix 40 mg po bid.   Diagnostic Dominance: Right    Patient Profile     86 y.o. male with acute on chronic systolic HF, severe AS, BPH, pulmonary  HTN, CKD IV who presented from the office with acute HF for diuresis.   Assessment & Plan    1. Acute on Chronic systolic HF: EF noted at 23-55% with severe LVH. Global hypokinesis. He has been diuresed with IV lasix 40mg  BID. Net - 14.8L, and weight is down 182>>168lbs. Cath yesterday with multivessel CAD. TCTS consulted with plans for CABG on Friday.  -- blood pressures have been soft limiting therapy -- resumed on lasix 40mg  BID PO, volume stable on exam  2. Severe low flow AS: mean gradient 15mmHg on echo. Seen by TCTS by Dr. Cyndia Bent with plans for CABG c/AVR  3. CAD/aortic atherosclerosis: Severe multivessel disease with plans for CABG Friday -- on ASA  4. HLD: has had intolerance to simvastatin in the past. Will attempt to add low dose crestor  5. CKD IV: Cr 1.4 today, stable with diuresis.  -- follow BMET  6. Pulmonary HTN: moderately elevated pressures 64mmHg on echo. Noted as mild on right heart with mean PA pressure of 2mmHg  7. Severe BPH: patient self caths  For questions or updates, please contact [place] Please consult [email or web address] for contact info under        Signed, Reino Bellis, NP  05/21/2020, 10:15 AM     ATTENDING ATTESTATION  I have seen, examined and evaluated the patient this AM along with Reino Bellis, NP-C on rounds.  After reviewing all the available data and chart, we discussed the patients laboratory, study & physical findings as well as symptoms in detail. I agree with her findings, examination as well as impression recommendations as per our discussion.    After initially thinking about going home for Thanksgiving and returning next week for his AVR CABG, he and his wife decided that the better option would be to stay here in the hospital and have his surgery on Friday.  Very reasonable given his advanced age, Covid restrictions and severity of his disease.  Thankfully, he is pretty much stabilized we have him on oral diuretic and  stable medications.  Right heart cath so that he was well diuresed.  We will simply monitor him over the next 2 days until he has the surgery.    Glenetta Hew, M.D., M.S. Interventional Cardiologist   Pager # [phone] Phone # [phone] [address] [place], [place] [address]

## 2020-05-21 NOTE — Progress Notes (Signed)
1 Day Post-Op Procedure(s) (LRB): RIGHT/LEFT HEART CATH AND CORONARY ANGIOGRAPHY (N/A) Subjective:  Says he feels fatigued and didn't sleep well but up walking around the room this am in his pajamas. Says his wife wants him to stay and have surgery Friday.  Objective: Vital signs in last 24 hours: Temp:  [97.3 F (36.3 C)-98.1 F (36.7 C)] 97.3 F (36.3 C) (11/24 0528) Pulse Rate:  [66-87] 77 (11/24 0528) Cardiac Rhythm: Heart block (11/24 0747) Resp:  [8-35] 15 (11/24 0528) BP: (93-124)/(68-95) 106/85 (11/24 0528) SpO2:  [0 %-99 %] 95 % (11/24 0528) Weight:  [76.2 kg] 76.2 kg (11/24 0528)  Hemodynamic parameters for last 24 hours:    Intake/Output from previous day: 11/23 0701 - 11/24 0700 In: 1118 [P.O.:1085; I.V.:33] Out: 1800 [Urine:1800] Intake/Output this shift: Total I/O In: 240 [P.O.:240] Out: -   General appearance: alert and cooperative Neurologic: intact Heart: regular rate and rhythm and systolic murmur of AS Lungs: clear to auscultation bilaterally Extremities: edema moderate edema  Lab Results: Recent Labs    05/20/20 0925 05/20/20 0933  HGB 12.9* 12.6*  HCT 38.0* 37.0*   BMET:  Recent Labs    05/20/20 0340 05/20/20 0925 05/20/20 0933 05/21/20 0310  NA 136   < > 140 138  K 3.8   < > 3.5 4.1  CL 100  --   --  101  CO2 25  --   --  25  GLUCOSE 101*  --   --  96  BUN 29*  --   --  28*  CREATININE 1.42*  --   --  1.40*  CALCIUM 9.0  --   --  9.1   < > = values in this interval not displayed.    PT/INR: No results for input(s): LABPROT, INR in the last 72 hours. ABG    Component Value Date/Time   PHART 7.462 (H) 05/20/2020 0925   HCO3 27.9 05/20/2020 0933   TCO2 29 05/20/2020 0933   O2SAT 69.0 05/20/2020 0933   CBG (last 3)  No results for input(s): GLUCAP in the last 72 hours.  Assessment/Plan:  Severe aortic stenosis and severe multi-vessel CAD. Will plan to keep him in hospital and do AVR+CABG Friday am. I discussed the operative  procedure with the patient including alternatives, benefits and risks; including but not limited to bleeding, blood transfusion, infection, stroke, myocardial infarction, graft failure, heart block requiring a permanent pacemaker, organ dysfunction, and death.  Urho Rio understands and agrees to proceed.  I will talk to his wife sometime today.  LOS: 5 days    Gaye Pollack 05/21/2020

## 2020-05-21 NOTE — Care Management Important Message (Signed)
Important Message  Patient Details  Name: Quadarius Henton MRN: [id number] Date of Birth: [birth year]   Medicare Important Message Given:  Yes     [name] 05/21/2020, 9:59 AM

## 2020-05-22 ENCOUNTER — Inpatient Hospital Stay (HOSPITAL_COMMUNITY): Payer: Medicare HMO

## 2020-05-22 DIAGNOSIS — I25118 Atherosclerotic heart disease of native coronary artery with other forms of angina pectoris: Secondary | ICD-10-CM | POA: Diagnosis not present

## 2020-05-22 DIAGNOSIS — I350 Nonrheumatic aortic (valve) stenosis: Secondary | ICD-10-CM | POA: Diagnosis not present

## 2020-05-22 LAB — URINALYSIS, ROUTINE W REFLEX MICROSCOPIC
Bilirubin Urine: NEGATIVE
Glucose, UA: NEGATIVE mg/dL
Ketones, ur: NEGATIVE mg/dL
Nitrite: NEGATIVE
Protein, ur: 30 mg/dL — AB
Specific Gravity, Urine: 1.009 (ref 1.005–1.030)
WBC, UA: >50 WBC/hpf — ABNORMAL HIGH (ref 0–5)
pH: 6 (ref 5.0–8.0)

## 2020-05-22 LAB — SURGICAL PCR SCREEN
MRSA, PCR: NEGATIVE
Staphylococcus aureus: NEGATIVE

## 2020-05-22 LAB — PROTIME-INR
INR: 1.1 (ref 0.8–1.2)
Prothrombin Time: 13.9 seconds (ref 11.4–15.2)

## 2020-05-22 LAB — APTT: aPTT: 34 seconds (ref 24–36)

## 2020-05-22 MED ORDER — CHLORHEXIDINE GLUCONATE CLOTH 2 % EX PADS
6.0000 | MEDICATED_PAD | Freq: Once | CUTANEOUS | Status: AC
  Administered 2020-05-22: 6 via TOPICAL

## 2020-05-22 MED ORDER — CHLORHEXIDINE GLUCONATE 0.12 % MT SOLN
15.0000 mL | Freq: Once | OROMUCOSAL | Status: AC
  Administered 2020-05-23: 15 mL via OROMUCOSAL
  Filled 2020-05-22: qty 15

## 2020-05-22 MED ORDER — METOPROLOL TARTRATE 12.5 MG HALF TABLET
12.5000 mg | ORAL_TABLET | Freq: Once | ORAL | Status: AC
  Administered 2020-05-23: 12.5 mg via ORAL
  Filled 2020-05-22: qty 1

## 2020-05-22 MED ORDER — TEMAZEPAM 15 MG PO CAPS
15.0000 mg | ORAL_CAPSULE | Freq: Once | ORAL | Status: AC | PRN
  Administered 2020-05-22: 15 mg via ORAL
  Filled 2020-05-22: qty 1

## 2020-05-22 MED ORDER — BISACODYL 5 MG PO TBEC
5.0000 mg | DELAYED_RELEASE_TABLET | Freq: Once | ORAL | Status: AC
  Administered 2020-05-22: 5 mg via ORAL
  Filled 2020-05-22: qty 1

## 2020-05-22 MED ORDER — DIAZEPAM 2 MG PO TABS
2.0000 mg | ORAL_TABLET | Freq: Once | ORAL | Status: AC
  Administered 2020-05-23: 2 mg via ORAL
  Filled 2020-05-22: qty 1

## 2020-05-22 MED ORDER — CHLORHEXIDINE GLUCONATE CLOTH 2 % EX PADS: 6.0000 | MEDICATED_PAD | Freq: Once | CUTANEOUS | Status: DC

## 2020-05-22 NOTE — Progress Notes (Signed)
Progress Note  Patient Name: [name] [name] Date of Encounter: 05/22/2020  [facility] Cardiologist: Raneisha Bress [place], MD   Subjective   Feels well, no dyspnea. Edema resolved.   Inpatient Medications    Scheduled Meds: . aspirin EC  81 mg Oral QHS  . dutasteride  0.5 mg Oral Daily  . enoxaparin (LOVENOX) injection  40 mg Subcutaneous Q24H  . [START ON 05/23/2020] epinephrine  0-10 mcg/min Intravenous To OR  . furosemide  40 mg Oral BID  . [START ON 05/23/2020] heparin-papaverine-plasmalyte irrigation   Irrigation To OR  . [START ON 05/23/2020] insulin   Intravenous To OR  . [START ON 05/23/2020] magnesium sulfate  40 mEq Other To OR  . [START ON 05/23/2020] phenylephrine  30-200 mcg/min Intravenous To OR  . [START ON 05/23/2020] potassium chloride  80 mEq Other To OR  . rosuvastatin  5 mg Oral Daily  . sodium chloride flush  3 mL Intravenous Q12H  . sodium chloride flush  3 mL Intravenous Q12H  . sulfaSALAzine  2,000 mg Oral Daily  . tamsulosin  0.4 mg Oral BID  . [START ON 05/23/2020] tranexamic acid  15 mg/kg Intravenous To OR  . [START ON 05/23/2020] tranexamic acid  2 mg/kg Intracatheter To OR   Continuous Infusions: . sodium chloride    . [START ON 05/23/2020] cefUROXime (ZINACEF)  IV    . [START ON 05/23/2020] cefUROXime (ZINACEF)  IV    . [START ON 05/23/2020] dexmedetomidine    . [START ON 05/23/2020] heparin 30,000 units/NS 1000 mL solution for CELLSAVER    . [START ON 05/23/2020] milrinone    . [START ON 05/23/2020] nitroGLYCERIN    . [START ON 05/23/2020] norepinephrine    . [START ON 05/23/2020] tranexamic acid (CYKLOKAPRON) infusion ([facility])    . [START ON 05/23/2020] vancomycin     PRN Meds: sodium chloride, acetaminophen, melatonin, nitroGLYCERIN, ondansetron (ZOFRAN) IV, polyethylene glycol, senna, sodium chloride flush   Vital Signs    Vitals:   05/21/20 2202 05/22/20 0044 05/22/20 0454 05/22/20 0754  BP: 103/71  117/76 126/88  Pulse: 72  82 68   Resp: 18  20 16   Temp: (!) 97.5 F (36.4 C)  97.9 F (36.6 C) 97.9 F (36.6 C)  TempSrc: Oral  Oral Oral  SpO2: 96%  98% 97%  Weight:  76.4 kg      Intake/Output Summary (Last 24 hours) at 05/22/2020 0816 Last data filed at 05/21/2020 2241 Gross per 24 hour  Intake 963 ml  Output 1800 ml  Net -837 ml   Last 3 Weights 05/22/2020 05/21/2020 05/20/2020  Weight (lbs) 168 lb 6.9 oz 168 lb 169 lb 6.4 oz  Weight (kg) 76.4 kg 76.204 kg 76.839 kg      Telemetry    SR with PVCs - Personally Reviewed  Physical Exam  Pleasant older male GEN: No acute distress.   Neck: No JVD Cardiac: RRR, + harsh systolic murmur, no rubs, or gallops.  Respiratory: Clear to auscultation bilaterally. GI: Soft, nontender, non-distended  MS: No edema; No deformity. Right radial site stable.  Neuro:  Nonfocal  Psych: Normal affect   Labs    High Sensitivity Troponin:  No results for input(s): TROPONINIHS in the last 720 hours.    Chemistry Recent Labs  Lab 05/16/20 1736 05/17/20 0335 05/19/20 1032 05/19/20 1032 05/20/20 0340 05/20/20 0340 05/20/20 0925 05/20/20 0933 05/21/20 0310  NA 137   < > 138   < > 136   < > 138  140 138  K 4.1   < > 3.7   < > 3.8   < > 3.7 3.5 4.1  CL 100   < > 100  --  100  --   --   --  101  CO2 26   < > 27  --  25  --   --   --  25  GLUCOSE 101*   < > 106*  --  101*  --   --   --  96  BUN 26*   < > 28*  --  29*  --   --   --  28*  CREATININE 1.39*   < > 1.44*  --  1.42*  --   --   --  1.40*  CALCIUM 9.2   < > 9.4  --  9.0  --   --   --  9.1  PROT 6.6  --   --   --   --   --   --   --   --   ALBUMIN 3.5  --   --   --   --   --   --   --   --   AST 39  --   --   --   --   --   --   --   --   ALT 25  --   --   --   --   --   --   --   --   ALKPHOS 67  --   --   --   --   --   --   --   --   BILITOT 0.5  --   --   --   --   --   --   --   --   GFRNONAA 48*   < > 46*  --  47*  --   --   --  48*  ANIONGAP 11   < > 11  --  11  --   --   --  12   < > = values in  this interval not displayed.     Hematology Recent Labs  Lab 05/16/20 1736 05/20/20 0925 05/20/20 0933  WBC 5.2  --   --   RBC 3.93*  --   --   HGB 12.3* 12.9* 12.6*  HCT 37.2* 38.0* 37.0*  MCV 94.7  --   --   MCH 31.3  --   --   MCHC 33.1  --   --   RDW 14.1  --   --   PLT 141*  --   --     BNP Recent Labs  Lab 05/16/20 1736  BNP 2,005.2*     DDimer No results for input(s): DDIMER in the last 168 hours.   Radiology    CARDIAC CATHETERIZATION  Addendum Date: 05/20/2020    Mid RCA lesion is 90% stenosed.  Mid RCA to Dist RCA lesion is 100% stenosed.  Prox LAD to Mid LAD lesion is 85% stenosed.  1st Mrg lesion is 95% stenosed.  Prox Cx to Mid Cx lesion is 100% stenosed.  LV end diastolic pressure is mildly elevated.  Hemodynamic findings consistent with mild pulmonary hypertension.  2nd Diag lesion is 100% stenosed.  1. Severe complex 3 vessel obstructive CAD. 2. Low flow Aortic stenosis. The valve is severely calcified. By cath mean gradient is 14 mm Hg. 3. Mildly elevated LV filling  pressures 4. Mild pulmonary HTN with mean PA pressure 22 mmHg 5. Cardiac index 2.74. Plan: surgical consultation for CABG/AVR. Will resume lasix 40 mg po bid.   Result Date: 05/20/2020  Mid RCA lesion is 90% stenosed.  Mid RCA to Dist RCA lesion is 100% stenosed.  Prox LAD to Mid LAD lesion is 85% stenosed.  1st Mrg lesion is 95% stenosed.  Prox Cx to Mid Cx lesion is 100% stenosed.  LV end diastolic pressure is mildly elevated.  Hemodynamic findings consistent with mild pulmonary hypertension.  1. Severe complex 3 vessel obstructive CAD. 2. Low flow Aortic stenosis. The valve is severely calcified. By cath mean gradient is 14 mm Hg. 3. Mildly elevated LV filling pressures 4. Mild pulmonary HTN with mean PA pressure 22 mmHg 5. Cardiac index 2.74. Plan: surgical consultation for CABG/AVR. Will resume lasix 40 mg po bid.   VAS US DOPPLER PRE CABG  Result Date: 05/20/2020 PREOPERATIVE  VASCULAR EVALUATION  Indications: Pre-CABG. Performing Technologist: Lita Mains RDMS, RVT  Examination Guidelines: A complete evaluation includes B-mode imaging, spectral Doppler, color Doppler, and power Doppler as needed of all accessible portions of each vessel. Bilateral testing is considered an integral part of a complete examination. Limited examinations for reoccurring indications may be performed as noted.  Right Carotid Findings: +----------+--------+--------+--------+------------+--------+           PSV cm/sEDV cm/sStenosisDescribe    Comments +----------+--------+--------+--------+------------+--------+ CCA Prox  44      21                                   +----------+--------+--------+--------+------------+--------+ CCA Distal41      18              heterogenous         +----------+--------+--------+--------+------------+--------+ ICA Prox  40      20                                   +----------+--------+--------+--------+------------+--------+ ICA Mid   47      18                                   +----------+--------+--------+--------+------------+--------+ ICA Distal28      13                                   +----------+--------+--------+--------+------------+--------+ ECA       51      14                                   +----------+--------+--------+--------+------------+--------+ Portions of this table do not appear on this page. +----------+--------+-------+--------+------------+           PSV cm/sEDV cmsDescribeArm Pressure +----------+--------+-------+--------+------------+ Subclavian79                                  +----------+--------+-------+--------+------------+ +---------+--------+--+--------+-+---------+ VertebralPSV cm/s23EDV cm/s9Antegrade +---------+--------+--+--------+-+---------+ Left Carotid Findings: +----------+--------+--------+--------+--------+--------+           PSV cm/sEDV  cm/sStenosisDescribeComments +----------+--------+--------+--------+--------+--------+ CCA Prox  48      21                               +----------+--------+--------+--------+--------+--------+  CCA Distal39      14                               +----------+--------+--------+--------+--------+--------+ ICA Prox  46      22                               +----------+--------+--------+--------+--------+--------+ ICA Mid   49      26                               +----------+--------+--------+--------+--------+--------+ ICA Distal36      18                               +----------+--------+--------+--------+--------+--------+ ECA       64      18                               +----------+--------+--------+--------+--------+--------+ +----------+--------+--------+--------+------------+ SubclavianPSV cm/sEDV cm/sDescribeArm Pressure +----------+--------+--------+--------+------------+           89                                   +----------+--------+--------+--------+------------+ +---------+--------+--+--------+--+---------+ VertebralPSV cm/s31EDV cm/s14Antegrade +---------+--------+--+--------+--+---------+  ABI Findings: +---------+------------------+-----+---------+--------+ Right    Rt Pressure (mmHg)IndexWaveform Comment  +---------+------------------+-----+---------+--------+ Brachial 107                    triphasic         +---------+------------------+-----+---------+--------+ PTA      255               2.38 biphasic          +---------+------------------+-----+---------+--------+ DP       255               2.38 triphasic         +---------+------------------+-----+---------+--------+ Great Toe147               1.37                   +---------+------------------+-----+---------+--------+ +---------+------------------+-----+---------+-------+ Left     Lt Pressure (mmHg)IndexWaveform Comment  +---------+------------------+-----+---------+-------+ Brachial 95                     triphasic        +---------+------------------+-----+---------+-------+ PTA      174               1.63 triphasic        +---------+------------------+-----+---------+-------+ DP       255               2.38 biphasic         +---------+------------------+-----+---------+-------+ Great Toe65                0.61                  +---------+------------------+-----+---------+-------+  Right Doppler Findings: +--------+--------+-----+---------+--------+ Site    PressureIndexDoppler  Comments +--------+--------+-----+---------+--------+ [id number]          triphasic         +--------+--------+-----+---------+--------+ Radial  triphasic         +--------+--------+-----+---------+--------+ Ulnar                triphasic         +--------+--------+-----+---------+--------+  Left Doppler Findings: +--------+--------+-----+---------+--------+ Site    PressureIndexDoppler  Comments +--------+--------+-----+---------+--------+ [id number]           triphasic         +--------+--------+-----+---------+--------+ Radial               triphasic         +--------+--------+-----+---------+--------+ Ulnar                triphasic         +--------+--------+-----+---------+--------+  Summary: Right Carotid: Velocities in the right ICA are consistent with a 1-39% stenosis. Left Carotid: Velocities in the left ICA are consistent with a 1-39% stenosis. Vertebrals: Bilateral vertebral arteries demonstrate antegrade flow. Right ABI: ABIs are unreliable. Left ABI: ABIs are unreliable. Right Upper Extremity: Doppler waveforms remain within normal limits with right radial compression. Doppler waveforms decrease >50% with right ulnar compression. Left Upper Extremity: Doppler waveform obliterate with left radial compression. Doppler waveforms remain within normal limits with  left ulnar compression.  Electronically signed by Deitra Mayo MD on 05/20/2020 at 5:56:21 PM.    Final     Cardiac Studies   Cath: 05/20/20   Mid RCA lesion is 90% stenosed.  Mid RCA to Dist RCA lesion is 100% stenosed.  Prox LAD to Mid LAD lesion is 85% stenosed.  1st Mrg lesion is 95% stenosed.  Prox Cx to Mid Cx lesion is 100% stenosed.  LV end diastolic pressure is mildly elevated.  Hemodynamic findings consistent with mild pulmonary hypertension.  2nd Diag lesion is 100% stenosed.   1. Severe complex 3 vessel obstructive CAD.  2. Low flow Aortic stenosis. The valve is severely calcified. By cath mean gradient is 14 mm Hg.  3. Mildly elevated LV filling pressures 4. Mild pulmonary HTN with mean PA pressure 22 mmHg 5. Cardiac index 2.74.  Plan: surgical consultation for CABG/AVR. Will resume lasix 40 mg po bid.   Diagnostic Dominance: Right    Patient Profile     86 y.o. male with acute on chronic systolic HF, severe AS, BPH, pulmonary HTN, CKD IV who presented from the office with acute HF for diuresis.   Assessment & Plan    1. Acute on Chronic systolic HF: EF noted at 93-71% with severe LVH. Global hypokinesis. He has been diuresed with IV lasix 40mg  BID. Net - 15.9L, and weight is down 182>>168lbs. Cath  With severe  multivessel CAD. TCTS consulted with plans for CABG/AVR on Friday.  -- blood pressures have been soft limiting therapy -- resumed on lasix 40mg  BID PO, volume stable on exam -- renal function stable.   2. Severe low flow AS: mean gradient 21mmHg on echo. Seen by TCTS by Dr. Cyndia Bent with plans for CABG c/AVR  3. CAD/aortic atherosclerosis: Severe multivessel disease with plans for CABG Friday -- on ASA  4. HLD: has had intolerance to simvastatin in the past. Will attempt to add low dose crestor  5. CKD IV: Cr 1.4 today, stable with diuresis.  -- follow BMET  6. Pulmonary HTN: moderately elevated pressures 57mmHg on echo. Noted as  mild on right heart with mean PA pressure of 59mmHg  7. Severe BPH: patient self caths  For questions or updates, please contact [place] Please consult [email or web address] for contact info under  Signed, Rossana Molchan [place], MD  05/22/2020, 8:16 AM

## 2020-05-22 NOTE — Progress Notes (Addendum)
Nurse paged inquiring about baseline CXR for patient preoperatively given cardiac surgery planned for tomorrow. Last CXR was in 12/2019 which was following a resolving PNA. He did not yet have one on admission. This would be helpful to have on file to compare post-operatively should there be any clinical changes warranting comparison. Order placed.   Addendum: since study report not yet finalized near shift change, will anticipate signing out to fellow to review report.  Rosevelt Luu PA-C

## 2020-05-22 NOTE — Progress Notes (Signed)
2 Days Post-Op Procedure(s) (LRB): RIGHT/LEFT HEART CATH AND CORONARY ANGIOGRAPHY (N/A) Subjective: No complaints. Got some sleep overnight. Sitting up eating breakfast.  Objective: Vital signs in last 24 hours: Temp:  [97.5 F (36.4 C)-97.9 F (36.6 C)] 97.9 F (36.6 C) (11/25 0454) Pulse Rate:  [61-82] 82 (11/25 0454) Cardiac Rhythm: Normal sinus rhythm (11/25 0038) Resp:  [18-20] 20 (11/25 0454) BP: (99-123)/(67-93) 117/76 (11/25 0454) SpO2:  [96 %-98 %] 98 % (11/25 0454) Weight:  [76.4 kg] 76.4 kg (11/25 0044)  Hemodynamic parameters for last 24 hours:    Intake/Output from previous day: 11/24 0701 - 11/25 0700 In: 963 [P.O.:960; I.V.:3] Out: 1800 [Urine:1800] Intake/Output this shift: No intake/output data recorded.  General appearance: alert and cooperative Heart: regular rate and rhythm and harsh systolic AS murmur Lungs: clear to auscultation bilaterally Extremities: moderate edema of legs persists  Lab Results: Recent Labs    05/20/20 0925 05/20/20 0933  HGB 12.9* 12.6*  HCT 38.0* 37.0*   BMET:  Recent Labs    05/20/20 0340 05/20/20 0925 05/20/20 0933 05/21/20 0310  NA 136   < > 140 138  K 3.8   < > 3.5 4.1  CL 100  --   --  101  CO2 25  --   --  25  GLUCOSE 101*  --   --  96  BUN 29*  --   --  28*  CREATININE 1.42*  --   --  1.40*  CALCIUM 9.0  --   --  9.1   < > = values in this interval not displayed.    PT/INR: No results for input(s): LABPROT, INR in the last 72 hours. ABG    Component Value Date/Time   PHART 7.462 (H) 05/20/2020 0925   HCO3 27.9 05/20/2020 0933   TCO2 29 05/20/2020 0933   O2SAT 69.0 05/20/2020 0933   CBG (last 3)  No results for input(s): GLUCAP in the last 72 hours.  Assessment/Plan:  Severe multi-vessel CAD and severe AS. Plan AVR and CABG in am. Orders written. He has no further questions. I talked with his wife yesterday by phone and she is agreement to proceed tomorrow morning.   LOS: 6 days    Gaye Pollack 05/22/2020

## 2020-05-22 NOTE — Progress Notes (Addendum)
Pt. Non compliant with clean technique when doing self catheterization. Pt was educated on clean technique and the importance.

## 2020-05-23 ENCOUNTER — Inpatient Hospital Stay (HOSPITAL_COMMUNITY): Payer: Medicare HMO | Admitting: Anesthesiology

## 2020-05-23 ENCOUNTER — Encounter (HOSPITAL_COMMUNITY): Payer: Self-pay | Admitting: Cardiology

## 2020-05-23 ENCOUNTER — Inpatient Hospital Stay (HOSPITAL_COMMUNITY): Payer: Medicare HMO

## 2020-05-23 ENCOUNTER — Encounter (HOSPITAL_COMMUNITY)
Admission: RE | Disposition: A | Discharge status: Skilled Nursing Facility | Payer: Self-pay | Source: Home / Self Care | Attending: Surgery

## 2020-05-23 DIAGNOSIS — N179 Acute kidney failure, unspecified: Secondary | ICD-10-CM | POA: Diagnosis not present

## 2020-05-23 DIAGNOSIS — I25110 Atherosclerotic heart disease of native coronary artery with unstable angina pectoris: Secondary | ICD-10-CM | POA: Diagnosis not present

## 2020-05-23 DIAGNOSIS — D689 Coagulation defect, unspecified: Secondary | ICD-10-CM | POA: Diagnosis not present

## 2020-05-23 DIAGNOSIS — Z20822 Contact with and (suspected) exposure to covid-19: Secondary | ICD-10-CM | POA: Diagnosis not present

## 2020-05-23 DIAGNOSIS — I5043 Acute on chronic combined systolic (congestive) and diastolic (congestive) heart failure: Secondary | ICD-10-CM | POA: Diagnosis not present

## 2020-05-23 DIAGNOSIS — I4892 Unspecified atrial flutter: Secondary | ICD-10-CM | POA: Diagnosis not present

## 2020-05-23 DIAGNOSIS — I130 Hypertensive heart and chronic kidney disease with heart failure and stage 1 through stage 4 chronic kidney disease, or unspecified chronic kidney disease: Secondary | ICD-10-CM | POA: Diagnosis not present

## 2020-05-23 DIAGNOSIS — D62 Acute posthemorrhagic anemia: Secondary | ICD-10-CM | POA: Diagnosis not present

## 2020-05-23 DIAGNOSIS — I080 Rheumatic disorders of both mitral and aortic valves: Secondary | ICD-10-CM | POA: Diagnosis not present

## 2020-05-23 DIAGNOSIS — I350 Nonrheumatic aortic (valve) stenosis: Secondary | ICD-10-CM | POA: Diagnosis not present

## 2020-05-23 DIAGNOSIS — N184 Chronic kidney disease, stage 4 (severe): Secondary | ICD-10-CM | POA: Diagnosis not present

## 2020-05-23 DIAGNOSIS — Z951 Presence of aortocoronary bypass graft: Secondary | ICD-10-CM

## 2020-05-23 DIAGNOSIS — R578 Other shock: Secondary | ICD-10-CM | POA: Diagnosis not present

## 2020-05-23 HISTORY — PX: CORONARY ARTERY BYPASS GRAFT: SHX141

## 2020-05-23 HISTORY — PX: AORTIC VALVE REPLACEMENT: SHX41

## 2020-05-23 HISTORY — PX: TEE WITHOUT CARDIOVERSION: SHX5443

## 2020-05-23 HISTORY — PX: ENDOVEIN HARVEST OF GREATER SAPHENOUS VEIN: SHX5059

## 2020-05-23 LAB — POCT I-STAT 7, (LYTES, BLD GAS, ICA,H+H)
Acid-Base Excess: 3 mmol/L — ABNORMAL HIGH (ref 0.0–2.0)
Acid-Base Excess: 2 mmol/L (ref 0.0–2.0)
Acid-Base Excess: 3 mmol/L — ABNORMAL HIGH (ref 0.0–2.0)
Acid-Base Excess: 2 mmol/L (ref 0.0–2.0)
Acid-Base Excess: 2 mmol/L (ref 0.0–2.0)
Acid-Base Excess: 0 mmol/L (ref 0.0–2.0)
Bicarbonate: 28.8 mmol/L — ABNORMAL HIGH (ref 20.0–28.0)
Bicarbonate: 27.1 mmol/L (ref 20.0–28.0)
Bicarbonate: 27.4 mmol/L (ref 20.0–28.0)
Bicarbonate: 25.6 mmol/L (ref 20.0–28.0)
Bicarbonate: 26.1 mmol/L (ref 20.0–28.0)
Bicarbonate: 25.3 mmol/L (ref 20.0–28.0)
Calcium, Ion: 1.17 mmol/L (ref 1.15–1.40)
Calcium, Ion: 1.04 mmol/L — ABNORMAL LOW (ref 1.15–1.40)
Calcium, Ion: 1.1 mmol/L — ABNORMAL LOW (ref 1.15–1.40)
Calcium, Ion: 1.02 mmol/L — ABNORMAL LOW (ref 1.15–1.40)
Calcium, Ion: 1.06 mmol/L — ABNORMAL LOW (ref 1.15–1.40)
Calcium, Ion: 1.25 mmol/L (ref 1.15–1.40)
HCT: 36 % — ABNORMAL LOW (ref 39.0–52.0)
HCT: 32 % — ABNORMAL LOW (ref 39.0–52.0)
HCT: 33 % — ABNORMAL LOW (ref 39.0–52.0)
HCT: 29 % — ABNORMAL LOW (ref 39.0–52.0)
HCT: 27 % — ABNORMAL LOW (ref 39.0–52.0)
HCT: 28 % — ABNORMAL LOW (ref 39.0–52.0)
Hemoglobin: 12.2 g/dL — ABNORMAL LOW (ref 13.0–17.0)
Hemoglobin: 10.9 g/dL — ABNORMAL LOW (ref 13.0–17.0)
Hemoglobin: 11.2 g/dL — ABNORMAL LOW (ref 13.0–17.0)
Hemoglobin: 9.9 g/dL — ABNORMAL LOW (ref 13.0–17.0)
Hemoglobin: 9.2 g/dL — ABNORMAL LOW (ref 13.0–17.0)
Hemoglobin: 9.5 g/dL — ABNORMAL LOW (ref 13.0–17.0)
O2 Saturation: 100 %
O2 Saturation: 100 %
O2 Saturation: 100 %
O2 Saturation: 100 %
O2 Saturation: 100 %
O2 Saturation: 97 %
Potassium: 3.8 mmol/L (ref 3.5–5.1)
Potassium: 3.6 mmol/L (ref 3.5–5.1)
Potassium: 3.7 mmol/L (ref 3.5–5.1)
Potassium: 3.7 mmol/L (ref 3.5–5.1)
Potassium: 4.2 mmol/L (ref 3.5–5.1)
Potassium: 4.9 mmol/L (ref 3.5–5.1)
Sodium: 141 mmol/L (ref 135–145)
Sodium: 141 mmol/L (ref 135–145)
Sodium: 140 mmol/L (ref 135–145)
Sodium: 140 mmol/L (ref 135–145)
Sodium: 140 mmol/L (ref 135–145)
Sodium: 139 mmol/L (ref 135–145)
TCO2: 30 mmol/L (ref 22–32)
TCO2: 28 mmol/L (ref 22–32)
TCO2: 29 mmol/L (ref 22–32)
TCO2: 27 mmol/L (ref 22–32)
TCO2: 27 mmol/L (ref 22–32)
TCO2: 27 mmol/L (ref 22–32)
pCO2 arterial: 50.1 mmHg — ABNORMAL HIGH (ref 32.0–48.0)
pCO2 arterial: 43.5 mmHg (ref 32.0–48.0)
pCO2 arterial: 40.1 mmHg (ref 32.0–48.0)
pCO2 arterial: 37.1 mmHg (ref 32.0–48.0)
pCO2 arterial: 37.2 mmHg (ref 32.0–48.0)
pCO2 arterial: 42.9 mmHg (ref 32.0–48.0)
pH, Arterial: 7.368 (ref 7.350–7.450)
pH, Arterial: 7.402 (ref 7.350–7.450)
pH, Arterial: 7.443 (ref 7.350–7.450)
pH, Arterial: 7.447 (ref 7.350–7.450)
pH, Arterial: 7.454 — ABNORMAL HIGH (ref 7.350–7.450)
pH, Arterial: 7.378 (ref 7.350–7.450)
pO2, Arterial: 553 mmHg — ABNORMAL HIGH (ref 83.0–108.0)
pO2, Arterial: 363 mmHg — ABNORMAL HIGH (ref 83.0–108.0)
pO2, Arterial: 375 mmHg — ABNORMAL HIGH (ref 83.0–108.0)
pO2, Arterial: 367 mmHg — ABNORMAL HIGH (ref 83.0–108.0)
pO2, Arterial: 313 mmHg — ABNORMAL HIGH (ref 83.0–108.0)
pO2, Arterial: 88 mmHg (ref 83.0–108.0)

## 2020-05-23 LAB — CBC WITH DIFFERENTIAL/PLATELET
Abs Immature Granulocytes: 0.02 10*3/uL (ref 0.00–0.07)
Basophils Absolute: 0 10*3/uL (ref 0.0–0.1)
Basophils Relative: 1 %
Eosinophils Absolute: 0.1 10*3/uL (ref 0.0–0.5)
Eosinophils Relative: 2 %
HCT: 39.9 % (ref 39.0–52.0)
Hemoglobin: 13.2 g/dL (ref 13.0–17.0)
Immature Granulocytes: 0 %
Lymphocytes Relative: 17 %
Lymphs Abs: 1.1 10*3/uL (ref 0.7–4.0)
MCH: 31.2 pg (ref 26.0–34.0)
MCHC: 33.1 g/dL (ref 30.0–36.0)
MCV: 94.3 fL (ref 80.0–100.0)
Monocytes Absolute: 0.6 10*3/uL (ref 0.1–1.0)
Monocytes Relative: 10 %
Neutro Abs: 4.5 10*3/uL (ref 1.7–7.7)
Neutrophils Relative %: 70 %
Platelets: 155 10*3/uL (ref 150–400)
RBC: 4.23 MIL/uL (ref 4.22–5.81)
RDW: 13.9 % (ref 11.5–15.5)
WBC: 6.3 10*3/uL (ref 4.0–10.5)
nRBC: 0 % (ref 0.0–0.2)

## 2020-05-23 LAB — BASIC METABOLIC PANEL
Anion gap: 9 (ref 5–15)
Anion gap: 10 (ref 5–15)
BUN: 29 mg/dL — ABNORMAL HIGH (ref 8–23)
BUN: 19 mg/dL (ref 8–23)
CO2: 24 mmol/L (ref 22–32)
CO2: 21 mmol/L — ABNORMAL LOW (ref 22–32)
Calcium: 8.8 mg/dL — ABNORMAL LOW (ref 8.9–10.3)
Calcium: 8.1 mg/dL — ABNORMAL LOW (ref 8.9–10.3)
Chloride: 101 mmol/L (ref 98–111)
Chloride: 107 mmol/L (ref 98–111)
Creatinine, Ser: 1.28 mg/dL — ABNORMAL HIGH (ref 0.61–1.24)
Creatinine, Ser: 1.14 mg/dL (ref 0.61–1.24)
GFR, Estimated: 53 mL/min — ABNORMAL LOW (ref >60)
GFR, Estimated: >60 mL/min (ref >60)
Glucose, Bld: 103 mg/dL — ABNORMAL HIGH (ref 70–99)
Glucose, Bld: 177 mg/dL — ABNORMAL HIGH (ref 70–99)
Potassium: 3.8 mmol/L (ref 3.5–5.1)
Potassium: 4.3 mmol/L (ref 3.5–5.1)
Sodium: 134 mmol/L — ABNORMAL LOW (ref 135–145)
Sodium: 138 mmol/L (ref 135–145)

## 2020-05-23 LAB — HEMOGLOBIN AND HEMATOCRIT, BLOOD
HCT: 27.5 % — ABNORMAL LOW (ref 39.0–52.0)
Hemoglobin: 9 g/dL — ABNORMAL LOW (ref 13.0–17.0)

## 2020-05-23 LAB — FIBRINOGEN
Fibrinogen: 231 mg/dL (ref 210–475)
Fibrinogen: 260 mg/dL (ref 210–475)

## 2020-05-23 LAB — GLUCOSE, CAPILLARY
Glucose-Capillary: 125 mg/dL — ABNORMAL HIGH (ref 70–99)
Glucose-Capillary: 128 mg/dL — ABNORMAL HIGH (ref 70–99)
Glucose-Capillary: 133 mg/dL — ABNORMAL HIGH (ref 70–99)
Glucose-Capillary: 146 mg/dL — ABNORMAL HIGH (ref 70–99)
Glucose-Capillary: 146 mg/dL — ABNORMAL HIGH (ref 70–99)
Glucose-Capillary: 127 mg/dL — ABNORMAL HIGH (ref 70–99)
Glucose-Capillary: 143 mg/dL — ABNORMAL HIGH (ref 70–99)
Glucose-Capillary: 158 mg/dL — ABNORMAL HIGH (ref 70–99)
Glucose-Capillary: 172 mg/dL — ABNORMAL HIGH (ref 70–99)

## 2020-05-23 LAB — POCT I-STAT, CHEM 8
BUN: 26 mg/dL — ABNORMAL HIGH (ref 8–23)
BUN: 25 mg/dL — ABNORMAL HIGH (ref 8–23)
BUN: 26 mg/dL — ABNORMAL HIGH (ref 8–23)
BUN: 23 mg/dL (ref 8–23)
BUN: 22 mg/dL (ref 8–23)
BUN: 22 mg/dL (ref 8–23)
BUN: 29 mg/dL — ABNORMAL HIGH (ref 8–23)
Calcium, Ion: 1.18 mmol/L (ref 1.15–1.40)
Calcium, Ion: 1.17 mmol/L (ref 1.15–1.40)
Calcium, Ion: 1.18 mmol/L (ref 1.15–1.40)
Calcium, Ion: 1.08 mmol/L — ABNORMAL LOW (ref 1.15–1.40)
Calcium, Ion: 1.08 mmol/L — ABNORMAL LOW (ref 1.15–1.40)
Calcium, Ion: 1.02 mmol/L — ABNORMAL LOW (ref 1.15–1.40)
Calcium, Ion: 1.24 mmol/L (ref 1.15–1.40)
Chloride: 102 mmol/L (ref 98–111)
Chloride: 103 mmol/L (ref 98–111)
Chloride: 103 mmol/L (ref 98–111)
Chloride: 101 mmol/L (ref 98–111)
Chloride: 102 mmol/L (ref 98–111)
Chloride: 103 mmol/L (ref 98–111)
Chloride: 104 mmol/L (ref 98–111)
Creatinine, Ser: 1.1 mg/dL (ref 0.61–1.24)
Creatinine, Ser: 1.1 mg/dL (ref 0.61–1.24)
Creatinine, Ser: 1.1 mg/dL (ref 0.61–1.24)
Creatinine, Ser: 1 mg/dL (ref 0.61–1.24)
Creatinine, Ser: 0.9 mg/dL (ref 0.61–1.24)
Creatinine, Ser: 0.8 mg/dL (ref 0.61–1.24)
Creatinine, Ser: 0.9 mg/dL (ref 0.61–1.24)
Glucose, Bld: 104 mg/dL — ABNORMAL HIGH (ref 70–99)
Glucose, Bld: 144 mg/dL — ABNORMAL HIGH (ref 70–99)
Glucose, Bld: 148 mg/dL — ABNORMAL HIGH (ref 70–99)
Glucose, Bld: 154 mg/dL — ABNORMAL HIGH (ref 70–99)
Glucose, Bld: 160 mg/dL — ABNORMAL HIGH (ref 70–99)
Glucose, Bld: 169 mg/dL — ABNORMAL HIGH (ref 70–99)
Glucose, Bld: 152 mg/dL — ABNORMAL HIGH (ref 70–99)
HCT: 39 % (ref 39.0–52.0)
HCT: 37 % — ABNORMAL LOW (ref 39.0–52.0)
HCT: 38 % — ABNORMAL LOW (ref 39.0–52.0)
HCT: 30 % — ABNORMAL LOW (ref 39.0–52.0)
HCT: 32 % — ABNORMAL LOW (ref 39.0–52.0)
HCT: 26 % — ABNORMAL LOW (ref 39.0–52.0)
HCT: 31 % — ABNORMAL LOW (ref 39.0–52.0)
Hemoglobin: 13.3 g/dL (ref 13.0–17.0)
Hemoglobin: 12.6 g/dL — ABNORMAL LOW (ref 13.0–17.0)
Hemoglobin: 12.9 g/dL — ABNORMAL LOW (ref 13.0–17.0)
Hemoglobin: 10.2 g/dL — ABNORMAL LOW (ref 13.0–17.0)
Hemoglobin: 10.9 g/dL — ABNORMAL LOW (ref 13.0–17.0)
Hemoglobin: 8.8 g/dL — ABNORMAL LOW (ref 13.0–17.0)
Hemoglobin: 10.5 g/dL — ABNORMAL LOW (ref 13.0–17.0)
Potassium: 3.8 mmol/L (ref 3.5–5.1)
Potassium: 3.6 mmol/L (ref 3.5–5.1)
Potassium: 3.7 mmol/L (ref 3.5–5.1)
Potassium: 3.5 mmol/L (ref 3.5–5.1)
Potassium: 3.7 mmol/L (ref 3.5–5.1)
Potassium: 4.2 mmol/L (ref 3.5–5.1)
Potassium: 4.9 mmol/L (ref 3.5–5.1)
Sodium: 141 mmol/L (ref 135–145)
Sodium: 140 mmol/L (ref 135–145)
Sodium: 140 mmol/L (ref 135–145)
Sodium: 140 mmol/L (ref 135–145)
Sodium: 139 mmol/L (ref 135–145)
Sodium: 139 mmol/L (ref 135–145)
Sodium: 139 mmol/L (ref 135–145)
TCO2: 26 mmol/L (ref 22–32)
TCO2: 23 mmol/L (ref 22–32)
TCO2: 24 mmol/L (ref 22–32)
TCO2: 25 mmol/L (ref 22–32)
TCO2: 25 mmol/L (ref 22–32)
TCO2: 24 mmol/L (ref 22–32)
TCO2: 23 mmol/L (ref 22–32)

## 2020-05-23 LAB — BLOOD GAS, ARTERIAL
Acid-Base Excess: 1.2 mmol/L (ref 0.0–2.0)
Bicarbonate: 25.1 mmol/L (ref 20.0–28.0)
Drawn by: 36496
FIO2: 21
O2 Saturation: 95.3 %
Patient temperature: 36.3
pCO2 arterial: 36.8 mmHg (ref 32.0–48.0)
pH, Arterial: 7.445 (ref 7.350–7.450)
pO2, Arterial: 73.2 mmHg — ABNORMAL LOW (ref 83.0–108.0)

## 2020-05-23 LAB — PREPARE RBC (CROSSMATCH)

## 2020-05-23 LAB — CBC
HCT: 28.3 % — ABNORMAL LOW (ref 39.0–52.0)
HCT: 26.7 % — ABNORMAL LOW (ref 39.0–52.0)
Hemoglobin: 9.1 g/dL — ABNORMAL LOW (ref 13.0–17.0)
Hemoglobin: 8.8 g/dL — ABNORMAL LOW (ref 13.0–17.0)
MCH: 31.1 pg (ref 26.0–34.0)
MCH: 30.6 pg (ref 26.0–34.0)
MCHC: 32.2 g/dL (ref 30.0–36.0)
MCHC: 33 g/dL (ref 30.0–36.0)
MCV: 96.6 fL (ref 80.0–100.0)
MCV: 92.7 fL (ref 80.0–100.0)
Platelets: 131 10*3/uL — ABNORMAL LOW (ref 150–400)
Platelets: 99 10*3/uL — ABNORMAL LOW (ref 150–400)
RBC: 2.93 MIL/uL — ABNORMAL LOW (ref 4.22–5.81)
RBC: 2.88 MIL/uL — ABNORMAL LOW (ref 4.22–5.81)
RDW: 13.9 % (ref 11.5–15.5)
RDW: 14 % (ref 11.5–15.5)
WBC: 12.4 10*3/uL — ABNORMAL HIGH (ref 4.0–10.5)
WBC: 9.2 10*3/uL (ref 4.0–10.5)
nRBC: 0 % (ref 0.0–0.2)
nRBC: 0 % (ref 0.0–0.2)

## 2020-05-23 LAB — POCT I-STAT EG7
Acid-Base Excess: 1 mmol/L (ref 0.0–2.0)
Bicarbonate: 26.2 mmol/L (ref 20.0–28.0)
Calcium, Ion: 1.05 mmol/L — ABNORMAL LOW (ref 1.15–1.40)
HCT: 30 % — ABNORMAL LOW (ref 39.0–52.0)
Hemoglobin: 10.2 g/dL — ABNORMAL LOW (ref 13.0–17.0)
O2 Saturation: 82 %
Potassium: 3.5 mmol/L (ref 3.5–5.1)
Sodium: 141 mmol/L (ref 135–145)
TCO2: 28 mmol/L (ref 22–32)
pCO2, Ven: 45.3 mmHg (ref 44.0–60.0)
pH, Ven: 7.37 (ref 7.250–7.430)
pO2, Ven: 48 mmHg — ABNORMAL HIGH (ref 32.0–45.0)

## 2020-05-23 LAB — PLATELET COUNT: Platelets: 91 10*3/uL — ABNORMAL LOW (ref 150–400)

## 2020-05-23 LAB — PROTIME-INR
INR: 1.6 — ABNORMAL HIGH (ref 0.8–1.2)
INR: 1.3 — ABNORMAL HIGH (ref 0.8–1.2)
Prothrombin Time: 18.2 seconds — ABNORMAL HIGH (ref 11.4–15.2)
Prothrombin Time: 16.1 seconds — ABNORMAL HIGH (ref 11.4–15.2)

## 2020-05-23 LAB — APTT
aPTT: 38 seconds — ABNORMAL HIGH (ref 24–36)
aPTT: 36 seconds (ref 24–36)

## 2020-05-23 LAB — MAGNESIUM: Magnesium: 2.7 mg/dL — ABNORMAL HIGH (ref 1.7–2.4)

## 2020-05-23 SURGERY — REPLACEMENT, AORTIC VALVE, OPEN
Anesthesia: General | Site: Leg Upper | Laterality: Right

## 2020-05-23 MED ORDER — SODIUM CHLORIDE 0.9% IV SOLUTION: Freq: Once | INTRAVENOUS | Status: AC

## 2020-05-23 MED ORDER — PHENYLEPHRINE HCL-NACL 20-0.9 MG/250ML-% IV SOLN
30.0000 ug/min | INTRAVENOUS | Status: DC
  Filled 2020-05-23: qty 250

## 2020-05-23 MED ORDER — POTASSIUM CHLORIDE 10 MEQ/50ML IV SOLN
10.0000 meq | INTRAVENOUS | Status: AC
  Administered 2020-05-23 (×3): 10 meq via INTRAVENOUS

## 2020-05-23 MED ORDER — NOREPINEPHRINE 4 MG/250ML-% IV SOLN
0.0000 ug/min | INTRAVENOUS | Status: DC
  Administered 2020-05-23: 10 ug/min via INTRAVENOUS
  Administered 2020-05-24 (×2): 17 ug/min via INTRAVENOUS
  Administered 2020-05-24: 12 ug/min via INTRAVENOUS
  Administered 2020-05-24: 9 ug/min via INTRAVENOUS
  Administered 2020-05-25: 12 ug/min via INTRAVENOUS
  Administered 2020-05-25: 20 ug/min via INTRAVENOUS
  Administered 2020-05-25: 18 ug/min via INTRAVENOUS
  Administered 2020-05-26: 22 ug/min via INTRAVENOUS
  Administered 2020-05-26: 16 ug/min via INTRAVENOUS
  Administered 2020-05-26: 22 ug/min via INTRAVENOUS
  Administered 2020-05-26: 15 ug/min via INTRAVENOUS
  Administered 2020-05-26: 18 ug/min via INTRAVENOUS
  Filled 2020-05-23 (×3): qty 250
  Filled 2020-05-23: qty 500
  Filled 2020-05-23 (×2): qty 250
  Filled 2020-05-23: qty 750
  Filled 2020-05-23 (×4): qty 250
  Filled 2020-05-23: qty 500
  Filled 2020-05-23: qty 250

## 2020-05-23 MED ORDER — SODIUM CHLORIDE 0.9 % IV SOLN
20.0000 ug | Freq: Once | INTRAVENOUS | Status: AC
  Administered 2020-05-23: 20 ug via INTRAVENOUS
  Filled 2020-05-23: qty 5

## 2020-05-23 MED ORDER — DEXTROSE 50 % IV SOLN: 0.0000 mL | INTRAVENOUS | Status: DC | PRN

## 2020-05-23 MED ORDER — LACTATED RINGERS IV SOLN: INTRAVENOUS | Status: DC

## 2020-05-23 MED ORDER — SODIUM CHLORIDE 0.9% FLUSH: 10.0000 mL | INTRAVENOUS | Status: DC | PRN

## 2020-05-23 MED ORDER — METOPROLOL TARTRATE 12.5 MG HALF TABLET: 12.5000 mg | ORAL_TABLET | Freq: Two times a day (BID) | ORAL | Status: DC

## 2020-05-23 MED ORDER — TRANEXAMIC ACID 1000 MG/10ML IV SOLN
1.5000 mg/kg/h | INTRAVENOUS | Status: DC
  Filled 2020-05-23: qty 25

## 2020-05-23 MED ORDER — ACETAMINOPHEN 500 MG PO TABS
1000.0000 mg | ORAL_TABLET | Freq: Four times a day (QID) | ORAL | Status: AC
  Administered 2020-05-24 – 2020-05-28 (×13): 1000 mg via ORAL
  Filled 2020-05-23 (×14): qty 2

## 2020-05-23 MED ORDER — MIDAZOLAM HCL 5 MG/5ML IJ SOLN
INTRAMUSCULAR | Status: DC | PRN
  Administered 2020-05-23: 1 mg via INTRAVENOUS
  Administered 2020-05-23: 2 mg via INTRAVENOUS
  Administered 2020-05-23 (×2): 1 mg via INTRAVENOUS

## 2020-05-23 MED ORDER — SODIUM CHLORIDE 0.9 % IV SOLN: INTRAVENOUS | Status: DC

## 2020-05-23 MED ORDER — MIDAZOLAM HCL (PF) 10 MG/2ML IJ SOLN
INTRAMUSCULAR | Status: AC
  Filled 2020-05-23: qty 2

## 2020-05-23 MED ORDER — PANTOPRAZOLE SODIUM 40 MG PO TBEC
40.0000 mg | DELAYED_RELEASE_TABLET | Freq: Every day | ORAL | Status: DC
  Administered 2020-05-25 – 2020-06-26 (×33): 40 mg via ORAL
  Filled 2020-05-23 (×33): qty 1

## 2020-05-23 MED ORDER — DOCUSATE SODIUM 100 MG PO CAPS
200.0000 mg | ORAL_CAPSULE | Freq: Every day | ORAL | Status: DC
  Administered 2020-05-24 – 2020-06-26 (×30): 200 mg via ORAL
  Filled 2020-05-23 (×32): qty 2

## 2020-05-23 MED ORDER — LIDOCAINE 2% (20 MG/ML) 5 ML SYRINGE
INTRAMUSCULAR | Status: DC | PRN
  Administered 2020-05-23: 100 mg via INTRAVENOUS

## 2020-05-23 MED ORDER — PROTAMINE SULFATE 10 MG/ML IV SOLN
INTRAVENOUS | Status: AC
  Filled 2020-05-23: qty 25

## 2020-05-23 MED ORDER — SODIUM CHLORIDE 0.9 % IV SOLN: INTRAVENOUS | Status: DC | PRN

## 2020-05-23 MED ORDER — 0.9 % SODIUM CHLORIDE (POUR BTL) OPTIME
TOPICAL | Status: DC | PRN
  Administered 2020-05-23: 1000 mL
  Administered 2020-05-23: 5000 mL

## 2020-05-23 MED ORDER — PHENYLEPHRINE 40 MCG/ML (10ML) SYRINGE FOR IV PUSH (FOR BLOOD PRESSURE SUPPORT)
PREFILLED_SYRINGE | INTRAVENOUS | Status: AC
  Filled 2020-05-23: qty 10

## 2020-05-23 MED ORDER — EPHEDRINE 5 MG/ML INJ
INTRAVENOUS | Status: AC
  Filled 2020-05-23: qty 10

## 2020-05-23 MED ORDER — CHLORHEXIDINE GLUCONATE 0.12 % MT SOLN
15.0000 mL | OROMUCOSAL | Status: AC
  Administered 2020-05-23: 15 mL via OROMUCOSAL

## 2020-05-23 MED ORDER — HEPARIN SODIUM (PORCINE) 1000 UNIT/ML IJ SOLN
INTRAMUSCULAR | Status: AC
  Filled 2020-05-23: qty 1

## 2020-05-23 MED ORDER — TRAMADOL HCL 50 MG PO TABS
50.0000 mg | ORAL_TABLET | ORAL | Status: DC | PRN
  Administered 2020-05-24 – 2020-05-26 (×6): 50 mg via ORAL
  Filled 2020-05-23 (×6): qty 1

## 2020-05-23 MED ORDER — INSULIN REGULAR(HUMAN) IN NACL 100-0.9 UT/100ML-% IV SOLN
INTRAVENOUS | Status: DC
  Administered 2020-05-25: 1.8 [IU]/h via INTRAVENOUS
  Filled 2020-05-23: qty 100

## 2020-05-23 MED ORDER — SODIUM CHLORIDE 0.9% IV SOLUTION: Freq: Once | INTRAVENOUS | Status: DC

## 2020-05-23 MED ORDER — BISACODYL 10 MG RE SUPP: 10.0000 mg | Freq: Every day | RECTAL | Status: DC

## 2020-05-23 MED ORDER — EPHEDRINE SULFATE-NACL 50-0.9 MG/10ML-% IV SOSY
PREFILLED_SYRINGE | INTRAVENOUS | Status: DC | PRN
  Administered 2020-05-23: 10 mg via INTRAVENOUS
  Administered 2020-05-23: 20 mg via INTRAVENOUS
  Administered 2020-05-23 (×2): 10 mg via INTRAVENOUS
  Administered 2020-05-23: 20 mg via INTRAVENOUS
  Administered 2020-05-23 (×2): 15 mg via INTRAVENOUS

## 2020-05-23 MED ORDER — PLASMA-LYTE 148 IV SOLN
INTRAVENOUS | Status: DC | PRN
  Administered 2020-05-23: 500 mL via INTRAVASCULAR

## 2020-05-23 MED ORDER — ALBUMIN HUMAN 5 % IV SOLN: INTRAVENOUS | Status: DC | PRN

## 2020-05-23 MED ORDER — CHLORHEXIDINE GLUCONATE 0.12% ORAL RINSE (MEDLINE KIT)
15.0000 mL | Freq: Two times a day (BID) | OROMUCOSAL | Status: DC
  Administered 2020-05-23: 15 mL via OROMUCOSAL

## 2020-05-23 MED ORDER — NITROGLYCERIN IN D5W 200-5 MCG/ML-% IV SOLN: 0.0000 ug/min | INTRAVENOUS | Status: DC

## 2020-05-23 MED ORDER — ROCURONIUM BROMIDE 10 MG/ML (PF) SYRINGE
PREFILLED_SYRINGE | INTRAVENOUS | Status: DC | PRN
  Administered 2020-05-23 (×2): 50 mg via INTRAVENOUS
  Administered 2020-05-23: 100 mg via INTRAVENOUS
  Administered 2020-05-23: 50 mg via INTRAVENOUS

## 2020-05-23 MED ORDER — CHLORHEXIDINE GLUCONATE CLOTH 2 % EX PADS: 6.0000 | MEDICATED_PAD | Freq: Every day | CUTANEOUS | Status: DC

## 2020-05-23 MED ORDER — FENTANYL CITRATE (PF) 250 MCG/5ML IJ SOLN
INTRAMUSCULAR | Status: AC
  Filled 2020-05-23: qty 25

## 2020-05-23 MED ORDER — SODIUM CHLORIDE 0.9% FLUSH: 3.0000 mL | INTRAVENOUS | Status: DC | PRN

## 2020-05-23 MED ORDER — MIDAZOLAM HCL 2 MG/2ML IJ SOLN: 2.0000 mg | INTRAMUSCULAR | Status: DC | PRN

## 2020-05-23 MED ORDER — FENTANYL CITRATE (PF) 250 MCG/5ML IJ SOLN
INTRAMUSCULAR | Status: DC | PRN
  Administered 2020-05-23: 25 ug via INTRAVENOUS
  Administered 2020-05-23: 950 ug via INTRAVENOUS
  Administered 2020-05-23: 100 ug via INTRAVENOUS
  Administered 2020-05-23: 25 ug via INTRAVENOUS

## 2020-05-23 MED ORDER — PHENYLEPHRINE HCL-NACL 20-0.9 MG/250ML-% IV SOLN: 0.0000 ug/min | INTRAVENOUS | Status: DC

## 2020-05-23 MED ORDER — PROTAMINE SULFATE 10 MG/ML IV SOLN
INTRAVENOUS | Status: DC | PRN
  Administered 2020-05-23: 25 mg via INTRAVENOUS

## 2020-05-23 MED ORDER — PROPOFOL 10 MG/ML IV BOLUS
INTRAVENOUS | Status: DC | PRN
  Administered 2020-05-23: 20 mg via INTRAVENOUS
  Administered 2020-05-23: 30 mg via INTRAVENOUS

## 2020-05-23 MED ORDER — THROMBIN 20000 UNITS EX SOLR
CUTANEOUS | Status: DC | PRN
  Administered 2020-05-23: 20000 [IU] via TOPICAL

## 2020-05-23 MED ORDER — ASPIRIN EC 325 MG PO TBEC: 325.0000 mg | DELAYED_RELEASE_TABLET | Freq: Every day | ORAL | Status: DC

## 2020-05-23 MED ORDER — FAMOTIDINE IN NACL 20-0.9 MG/50ML-% IV SOLN
INTRAVENOUS | Status: AC
  Filled 2020-05-23: qty 50

## 2020-05-23 MED ORDER — ROCURONIUM BROMIDE 10 MG/ML (PF) SYRINGE
PREFILLED_SYRINGE | INTRAVENOUS | Status: AC
  Filled 2020-05-23: qty 10

## 2020-05-23 MED ORDER — MORPHINE SULFATE (PF) 2 MG/ML IV SOLN
1.0000 mg | INTRAVENOUS | Status: DC | PRN
  Administered 2020-05-23: 1 mg via INTRAVENOUS
  Filled 2020-05-23: qty 1

## 2020-05-23 MED ORDER — LACTATED RINGERS IV SOLN: 500.0000 mL | Freq: Once | INTRAVENOUS | Status: DC | PRN

## 2020-05-23 MED ORDER — LIDOCAINE HCL (PF) 2 % IJ SOLN
INTRAMUSCULAR | Status: AC
  Filled 2020-05-23: qty 5

## 2020-05-23 MED ORDER — HEPARIN SODIUM (PORCINE) 1000 UNIT/ML IJ SOLN
INTRAMUSCULAR | Status: DC | PRN
  Administered 2020-05-23: 25000 [IU] via INTRAVENOUS

## 2020-05-23 MED ORDER — CHLORHEXIDINE GLUCONATE 0.12 % MT SOLN
OROMUCOSAL | Status: AC
  Filled 2020-05-23: qty 15

## 2020-05-23 MED ORDER — ACETAMINOPHEN 160 MG/5ML PO SOLN
1000.0000 mg | Freq: Four times a day (QID) | ORAL | Status: AC
  Administered 2020-05-24 – 2020-05-25 (×2): 1000 mg
  Filled 2020-05-23: qty 40.6

## 2020-05-23 MED ORDER — HEMOSTATIC AGENTS (NO CHARGE) OPTIME
TOPICAL | Status: DC | PRN
  Administered 2020-05-23 (×5): 1 via TOPICAL

## 2020-05-23 MED ORDER — NOREPINEPHRINE 4 MG/250ML-% IV SOLN
INTRAVENOUS | Status: AC
  Administered 2020-05-23: 2 ug/min via INTRAVENOUS
  Filled 2020-05-23: qty 250

## 2020-05-23 MED ORDER — ASPIRIN 81 MG PO CHEW: 324.0000 mg | CHEWABLE_TABLET | Freq: Every day | ORAL | Status: DC

## 2020-05-23 MED ORDER — DOPAMINE-DEXTROSE 3.2-5 MG/ML-% IV SOLN
INTRAVENOUS | Status: DC | PRN
  Administered 2020-05-23: 2 ug/kg/min via INTRAVENOUS

## 2020-05-23 MED ORDER — DEXMEDETOMIDINE HCL IN NACL 400 MCG/100ML IV SOLN: 0.0000 ug/kg/h | INTRAVENOUS | Status: DC

## 2020-05-23 MED ORDER — CHLORHEXIDINE GLUCONATE CLOTH 2 % EX PADS
6.0000 | MEDICATED_PAD | Freq: Every day | CUTANEOUS | Status: DC
  Administered 2020-05-23 – 2020-06-26 (×29): 6 via TOPICAL

## 2020-05-23 MED ORDER — SODIUM CHLORIDE 0.9 % IV SOLN
1.5000 g | Freq: Two times a day (BID) | INTRAVENOUS | Status: AC
  Administered 2020-05-23 – 2020-05-25 (×4): 1.5 g via INTRAVENOUS
  Filled 2020-05-23 (×4): qty 1.5

## 2020-05-23 MED ORDER — THROMBIN (RECOMBINANT) 20000 UNITS EX SOLR
CUTANEOUS | Status: AC
  Filled 2020-05-23: qty 20000

## 2020-05-23 MED ORDER — METOPROLOL TARTRATE 5 MG/5ML IV SOLN: 2.5000 mg | INTRAVENOUS | Status: DC | PRN

## 2020-05-23 MED ORDER — ACETAMINOPHEN 160 MG/5ML PO SOLN: 650.0000 mg | Freq: Once | ORAL | Status: AC

## 2020-05-23 MED ORDER — SODIUM CHLORIDE 0.9 % IV SOLN: 250.0000 mL | INTRAVENOUS | Status: DC

## 2020-05-23 MED ORDER — LACTATED RINGERS IV SOLN: INTRAVENOUS | Status: DC | PRN

## 2020-05-23 MED ORDER — ALBUMIN HUMAN 5 % IV SOLN
250.0000 mL | INTRAVENOUS | Status: AC | PRN
  Administered 2020-05-23 – 2020-05-24 (×3): 12.5 g via INTRAVENOUS
  Filled 2020-05-23: qty 250

## 2020-05-23 MED ORDER — SODIUM CHLORIDE 0.9% FLUSH: 3.0000 mL | Freq: Two times a day (BID) | INTRAVENOUS | Status: DC

## 2020-05-23 MED ORDER — METOPROLOL TARTRATE 25 MG/10 ML ORAL SUSPENSION: 12.5000 mg | Freq: Two times a day (BID) | ORAL | Status: DC

## 2020-05-23 MED ORDER — POTASSIUM CHLORIDE 10 MEQ/50ML IV SOLN: 10.0000 meq | INTRAVENOUS | Status: DC

## 2020-05-23 MED ORDER — FAMOTIDINE IN NACL 20-0.9 MG/50ML-% IV SOLN
20.0000 mg | Freq: Two times a day (BID) | INTRAVENOUS | Status: AC
  Administered 2020-05-23: 20 mg via INTRAVENOUS
  Filled 2020-05-23 (×2): qty 50

## 2020-05-23 MED ORDER — MAGNESIUM SULFATE 4 GM/100ML IV SOLN
4.0000 g | Freq: Once | INTRAVENOUS | Status: AC
  Administered 2020-05-23: 4 g via INTRAVENOUS
  Filled 2020-05-23: qty 100

## 2020-05-23 MED ORDER — PHENYLEPHRINE 40 MCG/ML (10ML) SYRINGE FOR IV PUSH (FOR BLOOD PRESSURE SUPPORT)
PREFILLED_SYRINGE | INTRAVENOUS | Status: DC | PRN
  Administered 2020-05-23: 160 ug via INTRAVENOUS
  Administered 2020-05-23: 120 ug via INTRAVENOUS
  Administered 2020-05-23: 80 ug via INTRAVENOUS
  Administered 2020-05-23 (×2): 160 ug via INTRAVENOUS
  Administered 2020-05-23: 120 ug via INTRAVENOUS

## 2020-05-23 MED ORDER — SODIUM CHLORIDE 0.9% FLUSH: 10.0000 mL | Freq: Two times a day (BID) | INTRAVENOUS | Status: DC

## 2020-05-23 MED ORDER — ACETAMINOPHEN 650 MG RE SUPP
650.0000 mg | Freq: Once | RECTAL | Status: AC
  Administered 2020-05-23: 650 mg via RECTAL

## 2020-05-23 MED ORDER — PROPOFOL 10 MG/ML IV BOLUS
INTRAVENOUS | Status: AC
  Filled 2020-05-23: qty 20

## 2020-05-23 MED ORDER — ORAL CARE MOUTH RINSE
15.0000 mL | OROMUCOSAL | Status: DC
  Administered 2020-05-23 – 2020-05-24 (×2): 15 mL via OROMUCOSAL

## 2020-05-23 MED ORDER — VANCOMYCIN HCL IN DEXTROSE 1-5 GM/200ML-% IV SOLN
1000.0000 mg | Freq: Once | INTRAVENOUS | Status: AC
  Administered 2020-05-23: 1000 mg via INTRAVENOUS
  Filled 2020-05-23: qty 200

## 2020-05-23 MED ORDER — ETOMIDATE 2 MG/ML IV SOLN
INTRAVENOUS | Status: AC
  Filled 2020-05-23: qty 10

## 2020-05-23 MED ORDER — BISACODYL 5 MG PO TBEC
10.0000 mg | DELAYED_RELEASE_TABLET | Freq: Every day | ORAL | Status: DC
  Administered 2020-05-24 – 2020-06-03 (×7): 10 mg via ORAL
  Filled 2020-05-23 (×9): qty 2

## 2020-05-23 MED ORDER — ONDANSETRON HCL 4 MG/2ML IJ SOLN
4.0000 mg | Freq: Four times a day (QID) | INTRAMUSCULAR | Status: DC | PRN
  Administered 2020-05-25 – 2020-05-28 (×4): 4 mg via INTRAVENOUS
  Filled 2020-05-23 (×4): qty 2

## 2020-05-23 MED ORDER — SODIUM CHLORIDE (PF) 0.9 % IJ SOLN
INTRAMUSCULAR | Status: AC
  Filled 2020-05-23: qty 10

## 2020-05-23 MED ORDER — DOPAMINE-DEXTROSE 3.2-5 MG/ML-% IV SOLN: 1.0000 ug/kg/min | INTRAVENOUS | Status: DC

## 2020-05-23 MED ORDER — OXYCODONE HCL 5 MG PO TABS
5.0000 mg | ORAL_TABLET | ORAL | Status: DC | PRN
  Administered 2020-05-29 (×2): 10 mg via ORAL
  Filled 2020-05-23 (×2): qty 2

## 2020-05-23 MED ORDER — SODIUM CHLORIDE 0.45 % IV SOLN: INTRAVENOUS | Status: DC | PRN

## 2020-05-23 SURGICAL SUPPLY — 131 items
ADAPTER CARDIO PERF ANTE/RETRO (ADAPTER) ×4 IMPLANT
ADH SKN CLS APL DERMABOND .7 (GAUZE/BANDAGES/DRESSINGS) ×3
ADPR PRFSN 84XANTGRD RTRGD (ADAPTER) ×3
BAG DECANTER FOR FLEXI CONT (MISCELLANEOUS) ×4 IMPLANT
BLADE CLIPPER SURG (BLADE) ×4 IMPLANT
BLADE STERNUM SYSTEM 6 (BLADE) ×4 IMPLANT
BLADE SURG 15 STRL LF DISP TIS (BLADE) ×3 IMPLANT
BLADE SURG 15 STRL SS (BLADE) ×4
BNDG ELASTIC 4X5.8 VLCR STR LF (GAUZE/BANDAGES/DRESSINGS) ×4 IMPLANT
BNDG ELASTIC 6X5.8 VLCR STR LF (GAUZE/BANDAGES/DRESSINGS) ×4 IMPLANT
BNDG GAUZE ELAST 4 BULKY (GAUZE/BANDAGES/DRESSINGS) ×4 IMPLANT
CANISTER SUCT 3000ML PPV (MISCELLANEOUS) ×4 IMPLANT
CANNULA GUNDRY RCSP 15FR (MISCELLANEOUS) ×4 IMPLANT
CANNULA SUMP PERICARDIAL (CANNULA) ×4 IMPLANT
CATH HEART VENT LEFT (CATHETERS) ×3 IMPLANT
CATH ROBINSON RED A/P 18FR (CATHETERS) ×12 IMPLANT
CATH THORACIC 28FR (CATHETERS) ×4 IMPLANT
CATH THORACIC 28FR RT ANG (CATHETERS) IMPLANT
CATH THORACIC 36FR (CATHETERS) ×4 IMPLANT
CATH THORACIC 36FR RT ANG (CATHETERS) ×4 IMPLANT
CLIP VESOCCLUDE MED 24/CT (CLIP) IMPLANT
CLIP VESOCCLUDE SM WIDE 24/CT (CLIP) ×4 IMPLANT
CNTNR URN SCR LID CUP LEK RST (MISCELLANEOUS) ×3 IMPLANT
CONN 3/8X3/8 GISH STERILE (MISCELLANEOUS) ×4 IMPLANT
CONT SPEC 4OZ STRL OR WHT (MISCELLANEOUS) ×4
COVER MAYO STAND STRL (DRAPES) ×4 IMPLANT
COVER SURGICAL LIGHT HANDLE (MISCELLANEOUS) IMPLANT
DERMABOND ADVANCED (GAUZE/BANDAGES/DRESSINGS) ×1
DERMABOND ADVANCED .7 DNX12 (GAUZE/BANDAGES/DRESSINGS) ×3 IMPLANT
DRAPE CARDIOVASCULAR INCISE (DRAPES) ×4
DRAPE SLUSH MACHINE 52X66 (DRAPES) ×4 IMPLANT
DRAPE SRG 135X102X78XABS (DRAPES) ×3 IMPLANT
DRSG COVADERM 4X14 (GAUZE/BANDAGES/DRESSINGS) ×4 IMPLANT
ELECT BLADE 4.0 EZ CLEAN MEGAD (MISCELLANEOUS) ×4
ELECT CAUTERY BLADE 6.4 (BLADE) ×4 IMPLANT
ELECT REM PT RETURN 9FT ADLT (ELECTROSURGICAL) ×8
ELECTRODE BLDE 4.0 EZ CLN MEGD (MISCELLANEOUS) ×3 IMPLANT
ELECTRODE REM PT RTRN 9FT ADLT (ELECTROSURGICAL) ×6 IMPLANT
FELT TEFLON 1X6 (MISCELLANEOUS) ×4 IMPLANT
GAUZE SPONGE 4X4 12PLY STRL (GAUZE/BANDAGES/DRESSINGS) ×8 IMPLANT
GAUZE SPONGE 4X4 12PLY STRL LF (GAUZE/BANDAGES/DRESSINGS) ×8 IMPLANT
GLOVE BIO SURGEON STRL SZ 6 (GLOVE) IMPLANT
GLOVE BIO SURGEON STRL SZ 6.5 (GLOVE) ×20 IMPLANT
GLOVE BIO SURGEON STRL SZ7 (GLOVE) IMPLANT
GLOVE BIO SURGEON STRL SZ7.5 (GLOVE) ×8 IMPLANT
GLOVE BIO SURGEON STRL SZ8 (GLOVE) ×4 IMPLANT
GLOVE BIOGEL PI IND STRL 6 (GLOVE) IMPLANT
GLOVE BIOGEL PI IND STRL 6.5 (GLOVE) ×12 IMPLANT
GLOVE BIOGEL PI IND STRL 7.0 (GLOVE) IMPLANT
GLOVE BIOGEL PI IND STRL 7.5 (GLOVE) ×6 IMPLANT
GLOVE BIOGEL PI IND STRL 8.5 (GLOVE) ×3 IMPLANT
GLOVE BIOGEL PI IND STRL 9 (GLOVE) ×3 IMPLANT
GLOVE BIOGEL PI INDICATOR 6 (GLOVE)
GLOVE BIOGEL PI INDICATOR 6.5 (GLOVE) ×4
GLOVE BIOGEL PI INDICATOR 7.0 (GLOVE)
GLOVE BIOGEL PI INDICATOR 7.5 (GLOVE) ×2
GLOVE BIOGEL PI INDICATOR 8.5 (GLOVE) ×1
GLOVE BIOGEL PI INDICATOR 9 (GLOVE) ×1
GLOVE ORTHO TXT STRL SZ7.5 (GLOVE) IMPLANT
GLOVE TRIUMPH SURG SIZE 7.0 (KITS) ×12 IMPLANT
GOWN STRL REUS W/ TWL LRG LVL3 (GOWN DISPOSABLE) ×24 IMPLANT
GOWN STRL REUS W/ TWL XL LVL3 (GOWN DISPOSABLE) ×6 IMPLANT
GOWN STRL REUS W/TWL LRG LVL3 (GOWN DISPOSABLE) ×32
GOWN STRL REUS W/TWL XL LVL3 (GOWN DISPOSABLE) ×8
HEMOSTAT POWDER SURGIFOAM 1G (HEMOSTASIS) ×16 IMPLANT
HEMOSTAT SURGICEL 2X14 (HEMOSTASIS) ×4 IMPLANT
INSERT FOGARTY 61MM (MISCELLANEOUS) IMPLANT
INSERT FOGARTY XLG (MISCELLANEOUS) IMPLANT
KIT BASIN OR (CUSTOM PROCEDURE TRAY) ×4 IMPLANT
KIT CATH CPB BARTLE (MISCELLANEOUS) ×4 IMPLANT
KIT SUCTION CATH 14FR (SUCTIONS) ×4 IMPLANT
KIT TURNOVER KIT B (KITS) ×4 IMPLANT
KIT VASOVIEW HEMOPRO 2 VH 4000 (KITS) ×4 IMPLANT
LINE VENT (MISCELLANEOUS) ×4 IMPLANT
NS IRRIG 1000ML POUR BTL (IV SOLUTION) ×24 IMPLANT
PACK E OPEN HEART (SUTURE) ×4 IMPLANT
PACK OPEN HEART (CUSTOM PROCEDURE TRAY) ×4 IMPLANT
PAD ARMBOARD 7.5X6 YLW CONV (MISCELLANEOUS) ×8 IMPLANT
PAD ELECT DEFIB RADIOL ZOLL (MISCELLANEOUS) ×4 IMPLANT
PENCIL BUTTON HOLSTER BLD 10FT (ELECTRODE) ×4 IMPLANT
POSITIONER HEAD DONUT 9IN (MISCELLANEOUS) ×4 IMPLANT
PUNCH AORTIC ROTATE 4.0MM (MISCELLANEOUS) IMPLANT
PUNCH AORTIC ROTATE 4.5MM 8IN (MISCELLANEOUS) ×4 IMPLANT
PUNCH AORTIC ROTATE 5MM 8IN (MISCELLANEOUS) IMPLANT
SET CARDIOPLEGIA MPS 5001102 (MISCELLANEOUS) ×4 IMPLANT
SPONGE INTESTINAL PEANUT (DISPOSABLE) IMPLANT
SPONGE LAP 18X18 RF (DISPOSABLE) ×12 IMPLANT
SPONGE LAP 4X18 RFD (DISPOSABLE) ×4 IMPLANT
SUPPORT HEART JANKE-BARRON (MISCELLANEOUS) ×4 IMPLANT
SUT BONE WAX W31G (SUTURE) ×4 IMPLANT
SUT ETHIBON 2 0 V 52N 30 (SUTURE) ×8 IMPLANT
SUT ETHIBON EXCEL 2-0 V-5 (SUTURE) ×8 IMPLANT
SUT ETHIBOND V-5 VALVE (SUTURE) ×12 IMPLANT
SUT MNCRL AB 4-0 PS2 18 (SUTURE) IMPLANT
SUT PROLENE 3 0 SH DA (SUTURE) IMPLANT
SUT PROLENE 3 0 SH1 36 (SUTURE) ×8 IMPLANT
SUT PROLENE 4 0 RB 1 (SUTURE) ×28
SUT PROLENE 4 0 SH DA (SUTURE) IMPLANT
SUT PROLENE 4-0 RB1 .5 CRCL 36 (SUTURE) ×21 IMPLANT
SUT PROLENE 5 0 C 1 36 (SUTURE) ×12 IMPLANT
SUT PROLENE 6 0 C 1 30 (SUTURE) ×16 IMPLANT
SUT PROLENE 7 0 BV 1 (SUTURE) IMPLANT
SUT PROLENE 7 0 BV1 MDA (SUTURE) ×8 IMPLANT
SUT PROLENE 8 0 BV175 6 (SUTURE) IMPLANT
SUT SILK  1 MH (SUTURE)
SUT SILK 1 MH (SUTURE) IMPLANT
SUT SILK 2 0 SH (SUTURE) IMPLANT
SUT SILK 2 0 SH CR/8 (SUTURE) ×4 IMPLANT
SUT STEEL 6MS V (SUTURE) ×8 IMPLANT
SUT STEEL STERNAL CCS#1 18IN (SUTURE) IMPLANT
SUT STEEL SZ 6 DBL 3X14 BALL (SUTURE) IMPLANT
SUT VIC AB 1 CTX 36 (SUTURE) ×8
SUT VIC AB 1 CTX36XBRD ANBCTR (SUTURE) ×6 IMPLANT
SUT VIC AB 2-0 CT1 27 (SUTURE) ×4
SUT VIC AB 2-0 CT1 TAPERPNT 27 (SUTURE) ×3 IMPLANT
SUT VIC AB 2-0 CTX 27 (SUTURE) IMPLANT
SUT VIC AB 3-0 SH 27 (SUTURE)
SUT VIC AB 3-0 SH 27X BRD (SUTURE) IMPLANT
SUT VIC AB 3-0 X1 27 (SUTURE) ×4 IMPLANT
SUT VICRYL 4-0 PS2 18IN ABS (SUTURE) IMPLANT
SYSTEM SAHARA CHEST DRAIN ATS (WOUND CARE) ×4 IMPLANT
TAPE CLOTH SURG 4X10 WHT LF (GAUZE/BANDAGES/DRESSINGS) ×4 IMPLANT
TAPE PAPER 2X10 WHT MICROPORE (GAUZE/BANDAGES/DRESSINGS) ×4 IMPLANT
TOWEL GREEN STERILE (TOWEL DISPOSABLE) ×4 IMPLANT
TOWEL GREEN STERILE FF (TOWEL DISPOSABLE) IMPLANT
TRAY FOLEY SLVR 16FR TEMP STAT (SET/KITS/TRAYS/PACK) ×4 IMPLANT
TUBING LAP HI FLOW INSUFFLATIO (TUBING) ×4 IMPLANT
UNDERPAD 30X36 HEAVY ABSORB (UNDERPADS AND DIAPERS) ×4 IMPLANT
VALVE AORTIC SZ29 INSP/RESIL (Valve) ×4 IMPLANT
VENT LEFT HEART 12002 (CATHETERS) ×4
WATER STERILE IRR 1000ML POUR (IV SOLUTION) ×8 IMPLANT

## 2020-05-23 NOTE — Progress Notes (Signed)
Patient has had increased chest tube output throughout shift after arrival from OR at 1530. On arrival patient had 300cc out of chest tube Dr. Cyndia Bent made aware. MD awaiting lab results before placing new orders. Dr Orvan Seen rounded at 1630 and placed orders for 1 RBC and 2 FFP. Dr Cyndia Bent then placed additional orders for 2 Cryo at 1830. Patient chest tube output being closely monitored.

## 2020-05-23 NOTE — Brief Op Note (Signed)
05/16/2020 - 05/23/2020  7:19 AM  PATIENT:  [name] [name]  86 y.o. male  PRE-OPERATIVE DIAGNOSIS:  CORONARY ARTERY DISEASE AORTIC STENOSIS  POST-OPERATIVE DIAGNOSIS:  CORONARY ARTERY DISEASE AORTIC STENOSIS  PROCEDURE:  Procedure(s): AORTIC VALVE REPLACEMENT (AVR) USING EDWARDS RESILIA 29 MM AORTIC VALVE. (N/A) CORONARY ARTERY BYPASS GRAFTING (CABG) USING LIMA to LAD; ENDOSCOPIC HARVESTED RIGHT GREATER SAPHENOUS VEIN: SVG to PD; SVG to SEQUENCED INTERMEDIATE to OM (N/A) TRANSESOPHAGEAL ECHOCARDIOGRAM (TEE) (N/A) ENDOVEIN HARVEST OF GREATER SAPHENOUS VEIN (Right) EVH- 45 MINUTES  SURGEON:  Surgeon(s) and Role:    * Bartle, Fernande Boyden, MD - Primary  PHYSICIAN ASSISTANT: Lugenia Assefa PA-C  ASSISTANTS: STAFF   ANESTHESIA:   general  EBL:  1092 mL   BLOOD ADMINISTERED:none  DRAINS:  LEFT PLEURAL AND MEDIASTINAL CHEST TUBES    LOCAL MEDICATIONS USED:  NONE  SPECIMEN:  Source of Specimen:  AORTIC VALVE LEAFLETS  DISPOSITION OF SPECIMEN:  PATHOLOGY  COUNTS:  YES  TOURNIQUET:  * No tourniquets in log *  DICTATION: .Dragon Dictation  PLAN OF CARE: Admit to inpatient   PATIENT DISPOSITION:  ICU - intubated and hemodynamically stable.   Delay start of Pharmacological VTE agent (>24hrs) due to surgical blood loss or risk of bleeding: yes  COMPLICATIONS: NO KNOWN

## 2020-05-23 NOTE — Progress Notes (Signed)
Dr Orvan Seen notified of continued high chest tube outputs. Orders received for 1 unit of RBC's,DDAVP, and repeat labs.

## 2020-05-23 NOTE — Progress Notes (Signed)
Pt's U/A has resulted back as many bacteria and a good amount of WBC's in his urine,  pt does self cath himself, and this could be contributing to this, pt may need some teaching on how to self cath on sterile technique. Cardiology MD on call  has been notified and aware and has ordered a CBC now instead of at 5 am to help determine if pt has an infection or not, pt has no s/s other than this, will continue to monitor, Thanks [name] [name].

## 2020-05-23 NOTE — Anesthesia Preprocedure Evaluation (Signed)
Anesthesia Evaluation  Patient identified by MRN, date of birth, ID band Patient awake    Reviewed: Allergy & Precautions, NPO status , Patient's Chart, lab work & pertinent test results  Airway Mallampati: I  TM Distance: >3 FB Neck ROM: Full    Dental   Pulmonary    Pulmonary exam normal        Cardiovascular hypertension, Pt. on medications + CAD  Normal cardiovascular exam+ Valvular Problems/Murmurs AS      Neuro/Psych    GI/Hepatic   Endo/Other    Renal/GU      Musculoskeletal   Abdominal   Peds  Hematology   Anesthesia Other Findings   Reproductive/Obstetrics                             Anesthesia Physical Anesthesia Plan  ASA: III  Anesthesia Plan: General   Post-op Pain Management:    Induction: Intravenous  PONV Risk Score and Plan: 2 and Treatment may vary due to age or medical condition  Airway Management Planned: Oral ETT  Additional Equipment: Arterial line, PA Cath, TEE and Ultrasound Guidance Line Placement  Intra-op Plan:   Post-operative Plan: Post-operative intubation/ventilation  Informed Consent: I have reviewed the patients History and Physical, chart, labs and discussed the procedure including the risks, benefits and alternatives for the proposed anesthesia with the patient or authorized representative who has indicated his/her understanding and acceptance.       Plan Discussed with: CRNA and Surgeon  Anesthesia Plan Comments:         Anesthesia Quick Evaluation

## 2020-05-23 NOTE — Progress Notes (Signed)
Pt's CBC earlier was WNL, but his U/A was showing many bacteria and WBC's from previous note, MD ordered urine cx and blood cx's as well, will continue to monitor, Thanks Arvella Nigh RN.

## 2020-05-23 NOTE — Plan of Care (Signed)
  Problem: Pain Managment: Goal: General experience of comfort will improve Outcome: Completed/Met   Problem: Safety: Goal: Ability to remain free from injury will improve Outcome: Completed/Met

## 2020-05-23 NOTE — Transfer of Care (Signed)
Immediate Anesthesia Transfer of Care Note  Patient: [name] [name]  Procedure(s) Performed: AORTIC VALVE REPLACEMENT (AVR) USING EDWARDS RESILIA 29 MM AORTIC VALVE. (N/A Chest) CORONARY ARTERY BYPASS GRAFTING (CABG) USING LIMA to LAD; ENDOSCOPIC HARVESTED RIGHT GREATER SAPHENOUS VEIN: SVG to PD; SVG to SEQUENCED INTERMEDIATE to OM (N/A Chest) TRANSESOPHAGEAL ECHOCARDIOGRAM (TEE) (N/A ) ENDOVEIN HARVEST OF GREATER SAPHENOUS VEIN (Right Leg Upper)  Patient Location: ICU  Anesthesia Type:General  Level of Consciousness: Patient remains intubated per anesthesia plan  Airway & Oxygen Therapy: Patient remains intubated per anesthesia plan and Patient placed on Ventilator (see vital sign flow sheet for setting)  Post-op Assessment: Report given to RN and Post -op Vital signs reviewed and stable  Post vital signs: Reviewed and stable  Last Vitals:  Vitals Value Taken Time  BP 97/52 05/23/20 15:40  Temp 36 C 05/23/20 15:40  Pulse 90 05/23/20 15:40  Resp 12 05/23/20 15:40  SpO2 96 05/23/20 15:40    Last Pain:  Vitals:   05/23/20 0541  TempSrc: Oral  PainSc:       Patients Stated Pain Goal: 0 (47/18/55 0158)  Complications: No complications documented.

## 2020-05-23 NOTE — Progress Notes (Signed)
CBC/diff came back negative for any abnormal values, will continue to monitor, Thanks Arvella Nigh RN.

## 2020-05-23 NOTE — Progress Notes (Signed)
   Urinalysis abnl, urine culture sent. Will send blood cultures as well.  Rosaria Ferries, PA-C 05/23/2020 6:29 AM

## 2020-05-23 NOTE — Hospital Course (Addendum)
History of Present Illness:  [name] [name] is a 86 year old gentleman with hypertension, hyperlipidemia, degenerative arthritis status post bilateral knee replacement and right shoulder replacement, peripheral neuropathy, and aortic stenosis with chronic diastolic congestive heart failure who has been followed by Dr. Martinique.  He has a history of chronic lower extremity edema with no prior DVT.  He was treated for pneumonia in April 2021 and was Covid negative.  He was also found to have a pleural effusion and started on Lasix.  An echocardiogram in June 2021 showed a left ventricular ejection fraction of 40 to 45% with global hypokinesis and severe hypokinesis of the entire inferior wall and inferoseptal wall.  There was moderate aortic stenosis with a mean gradient of 20 mmHg, aortic valve area 1.38 cm, and dimensionless index of 0.33.  There is also moderate mitral regurgitation.  He was seen in September 2021 with worsening congestive heart failure with increasing lower extremity edema, weight gain, and progressive fatigue.  His diuretic was increased.  A follow-up echocardiogram on 04/01/2020 showed a further drop in his ejection fraction to 30 to 35% with global hypokinesis.  There is moderate mitral regurgitation.  The aortic valve leaflets were thickened and calcified with a mean gradient of 24 mmHg and a valve area of 1.14 cm.  Dimensionless index was 0.25.  He was seen in the office on 05/15/2020 by Dr. Martinique and reported an 11 pound weight gain over the prior 3 weeks with progressive shortness of breath, lower extremity edema, and fatigue.  He was admitted for intravenous diuresis and is 14.3 L negative since admission.  Cardiac catheterization was performed  05/20/2020 and showed severe three-vessel coronary disease with heavily calcified vessels.  The LAD has 85% proximal stenosis.  The left circumflex has a large first marginal branch that has 95% stenosis and then is occluded with faint filling of  the second marginal by collaterals.  The right coronary artery has 90% mid vessel stenosis followed by occlusion with the PDA and PL branch filling by collaterals from the left.  It was felt coronary bypass grafting and aortic valve replacement would be indicated and TCTS consult was requested.  The patient was evaluated by Dr. Cyndia Bent and he was felt to be a candidate for surgery.  He would be considered a high risk candidate due to his advanced age.  The risks and benefits of the procedure were explained to the patient and he was agreeable to proceed.  Hospital Course:  [name] was taken to the operating room on 05/23/2020.  He underwent CABG x  utilizing LIMA to LAD, SVG to PDA, Sequential SVG to OM1 and OM 2.  He also underwent Aortic Valve Replacement with a 29 mm Edwards Inspiris Resilia bioprosthetic valve.  He also underwent endoscopic vein harvest from his right leg.  He tolerated the procedure without difficulty and was taken to the SICU in stable condition.  He was transfused packed cells, cryo and FFP post operatively for coagulopathy.  This persisted and the patient was transfused another unit of packed cells and was treated with DDAVP.  The patient was weaned and extubated overnight.  The patient suffered a vagal episode upon standing and become unresponsive briefly.  He was sat down and was alert and hemodynamically stable.  He was started on Amiodarone.  He was weaned of Dopamine, Milrinone and Levophed as hemodynamics allowed.  His chest tubes and arterial lines were removed on 05/25/2020.  He was transitioned to an oral Amiodarone regimen.  The patient was having issues with hypotension.  His Milrinone was discontinued.  On 05/27/2020 he remained hemodynamically stable but still remained dependent on norepinephrine and midodrine for blood pressure support.  He remained volume overloaded and did not respond well to torsemide on the previous day and therefore it was felt he should be  transitioned to a Lasix drip.  Additionally the advanced heart failure team was consulted to assist with management.  It was felt he may be in atrial fibrillation and was AV paced at 90 to assist also with blood pressure and cardiac output since rhythm was noted to be slower and irregular on its own.  The patient underwent PICC line placement on 05/28/2020.  He underwent an Echocardiogram which showed a reduced EF of 35%.  A properly functioning aortic valve prosthesis in present.  The patient continued to require pressor support.  It was felt these should not be weaned until his hypervolemic state was improved.  He will not be started on a beta blocker due to post operative cardiotomy shock.  He developed elevated bicarb level due to aggressive diuresis.  He was treated with Diamox for this.  He has been weaned of all IV support but is using midodrine for BP support.  He was evaluated by PT/OT who recommended SNF placement at time of discharge.  On 06/05/2020 patient did have a decrease in his co-oximetry and subsequently has been started on dobutamine.  Co-oximetry was improved to 55.  He remains in sinus rhythm at this time.  He has also developed an increased oxygen requirement and leukocytosis with white blood cell count on 06/06/2020 19.9.  Right lower lobe opacity on chest x-ray is concerning for possible pneumonia.  He has been started on empiric vancomycin and cefepime per pharmacy dosing.  Weight trending also has been increasing and he has been resumed on diuretics.  He is on tube feeds for maximization of nutrition protein/calorie intake.  Weakness and debilitation continues to be a significant problem.  On 2020-06-09 his chest x-ray improved in appearance of the suspected pneumonia.  White blood cell count has returned to normal.  He remained stable on low-dose dobutamine with a coox of 68% and CVP of 5.  He continues to require diuretics and has had some urinary retention requiring straight  catheterization.  On 06/13/2020 he was felt to be Lasix was placed on hold.  He continues to maintain sinus rhythm.  He continues with physical therapy showing a slow but steady improvement in this regard.  He continues to have some difficulty with motivation depression. As of 06/15/2020 his dobutamine has been weaned off and he maintains good cooximetry with most recent value 70. Palliative care has been asked to assist with symptom management and they have seen the patient and made suggestions.  The patient continues to wish to become fully recovered.  His Cortrak and tube feedings were discontinued ton 06/18/2020.  His oral intake improved.  He supplemented with Ensure in between meals.  He continued to work with PT/OT to increase strength and mobility.

## 2020-05-23 NOTE — Progress Notes (Signed)
RN paging that UA was obtained as routine pre-op and is cloudy with many  WBC  Patient with no UTI symptoms, does self cath. Sample obtained from self catheterization. He does not have any symptoms, no CBC on file, no fevers UA is dirty- large LEA, nitrite neg, many bacteria, WBCs  CBC ordered Given asymptomatic- I'm holding off on giving abx. From chart review- he does not appear to be having any sepsis or infected pic. He will get pre-op abx  RN to  Inform CTSx in am prior to taking him to the OR.

## 2020-05-23 NOTE — Op Note (Signed)
CARDIOVASCULAR SURGERY OPERATIVE NOTE  05/23/2020  Surgeon:  Gaye Pollack, MD  First Assistant: Jadene Pierini,  PA-C   Preoperative Diagnosis:  Severe multi-vessel coronary artery disease and severe low flow, low gradient aortic stenosis   Postoperative Diagnosis:  Same   Procedure:  1. Median Sternotomy 2. Extracorporeal circulation 3.   Coronary artery bypass grafting x 4   Left internal mammary artery graft to the LAD  Sequential SVG to OM1 and OM2  SVG to PDA  4.   Endoscopic vein harvest from the right leg 5.   Aortic valve replacement using a 29 mm Edwards INSPIRIS RESILIA pericardial valve.   Anesthesia:  General Endotracheal   Clinical History/Surgical Indication:  This 86 year old gentleman presents with stage D, severe, symptomatic low flow, low gradient aortic stenosis with New York Heart Association class IV symptoms of congestive heart failure with shortness of breath and fatigue at rest and massive volume overload.  He has responded well to diuresis of -14 L and feels much better.  I have personally reviewed his 2D echocardiogram and cardiac catheterization.  His echocardiogram shows a thickened and calcified aortic valve with restricted leaflet mobility.  The mean gradient was measured at 24 mmHg with a dimensionless index of 0.26 and a stroke-volume index is 21.  His left ventricular ejection fraction has decreased to 30 to 35% with global hypokinesis from 40 to 45% in June 2021.  Cardiac catheterization shows severe three-vessel coronary disease with calcified proximal coronary arteries in the area of stenoses.  His left circumflex and right arteries are occluded with collaterals to the distal vessels.  I agree the best treatment for this patient is coronary bypass graft surgery and aortic valve replacement for improvement of his symptoms and to prevent progressive left  ventricular dysfunction and further congestive heart failure.  His operative risk is increased due to his age but he is still active and I think will benefit from surgery. I discussed the operative procedure with the patient including alternatives, benefits and risks; including but not limited to bleeding, blood transfusion, infection, stroke, myocardial infarction, graft failure, heart block requiring a permanent pacemaker, organ dysfunction, and death.  [name] [name] understands and agrees to proceed.   Preparation:  The patient was seen in the preoperative holding area and the correct patient, correct operation were confirmed with the patient after reviewing the medical record and catheterization. The consent was signed by me. Preoperative antibiotics were given. A pulmonary arterial line and radial arterial line were placed by the anesthesia team. The patient was taken back to the operating room and positioned supine on the operating room table. After being placed under general endotracheal anesthesia by the anesthesia team a foley catheter was placed. The neck, chest, abdomen, and both legs were prepped with betadine soap and solution and draped in the usual sterile manner. A surgical time-out was taken and the correct patient and operative procedure were confirmed with the nursing and anesthesia staff.   Cardiopulmonary Bypass:  A median sternotomy was performed. The pericardium was opened in the midline. Right ventricular function appeared normal. The ascending aorta was mildly enlarged proximally and had no palpable plaque. There were no contraindications to aortic cannulation or cross-clamping. The patient was fully systemically heparinized and the ACT was maintained > 400 sec. The proximal aortic arch was cannulated with a 20 F aortic cannula for arterial inflow. Venous cannulation was performed via the right atrial appendage using a two-staged venous cannula. An antegrade cardioplegia/vent cannula  was  inserted into the mid-ascending aorta. Aortic occlusion was performed with a single cross-clamp. Systemic cooling to 32 degrees Centigrade and topical cooling of the heart with iced saline were used. Hyperkalemic antegrade cold blood cardioplegia was used to induce diastolic arrest and was then given at about 20 minute intervals throughout the period of arrest to maintain myocardial temperature at or below 10 degrees centigrade. A temperature probe was inserted into the interventricular septum and an insulating pad was placed in the pericardium.   Left internal mammary artery harvest:  The left side of the sternum was retracted using the Rultract retractor. The left internal mammary artery was harvested as a pedicle graft. All side branches were clipped. It was a medium-sized vessel of good quality with excellent blood flow. It was ligated distally and divided. It was sprayed with topical papaverine solution to prevent vasospasm.   Endoscopic vein harvest:  The right greater saphenous vein was harvested endoscopically through a 2 cm incision medial to the right knee. It was harvested from the upper thigh to below the knee. It was a medium-sized vein of good quality. The side branches were all ligated with 4-0 silk ties.    Coronary arteries:  The coronary arteries were examined.   LAD:  Intramyocardial throughout proximal and mid portions. Distally it was a medium caliber vessel with no disease.  LCX:  OM1 visible proximally where it was diseased and then became intramyocardial. It was traced into the muscle where it was a medium caliber vessel with no disease. OM2 was diffusely diseased but graftable distally.  RCA:  Medium caliber PDA that was diffusely diseased but graftable. PL branches were small.   Grafts:  1. LIMA to the LAD: 1.75 mm. It was sewn end to side using 8-0 prolene continuous suture. 2. Sequential SVG to OM1:  1.6 mm. It was sewn sequential side to side using 7-0  prolene continuous suture. 3. Sequential SVG to OM2:  1.6 mm. It was sewn sequential end to side using 7-0 prolene continuous suture. 4. SVG to PDA:  1.6 mm. It was sewn end to side using 7-0 prolene continuous suture.  The proximal vein graft anastomoses were performed to the mid-ascending aorta using continuous 6-0 prolene suture. Graft markers were placed around the proximal anastomoses.   Aortic Valve Replacement:    A transverse aortotomy was performed 1 cm above the take-off of the right coronary artery. The native valve was trileaflet with calcified leaflets and mild annular calcification. The ostia of the coronary arteries were in normal position but there were separate ostia for the LAD and LCX. The native valve leaflets were excised and the annulus was decalcified with rongeurs. Care was taken to remove all particulate debris. The left ventricle was directly inspected for debris and then irrigated with ice saline solution. The annulus was sized and a size 29 mm Edwards INSPIRIS RESILIA pericardial valve was chosen. The model number was [id number] and the serial number was [id number].  While the valve was being prepared 2-0 Ethibond pledgeted horizontal mattress sutures were placed around the annulus with the pledgets in a sub-annular position. The sutures were placed through the sewing ring and the valve lowered into place. The sutures were tied sequentially. The valve seated nicely and the coronary ostia were not obstructed. The prosthetic valve leaflets moved normally and there was no sub-valvular obstruction. The aortotomy was closed using 4-0 Prolene suture in 2 layers with felt strips to reinforce the closure.  Completion:  The patient was rewarmed  to 37 degrees Centigrade. The clamp was removed from the LIMA pedicle and there was rapid warming of the septum and return of ventricular fibrillation. The crossclamp was removed with a time of 173 minutes. There was spontaneous return of sinus  rhythm. The distal and proximal anastomoses were checked for hemostasis. The position of the grafts was satisfactory. Two temporary epicardial pacing wires were placed on the right atrium and two on the right ventricle. The patient was weaned from CPB without difficulty on milrinone 0.25 and dopamine 5.  CPB time was 203 minutes. Cardiac output was 4 LPM. TEE showed a normally functioning aortic valve prosthesis with no paravalvular leak. Mean gradient was 2 mm Hg.  LV systolic function appeared unchanged from preop. There was trivial MR.  Heparin was fully reversed with protamine and the aortic and venous cannulas removed. Hemostasis was achieved. Mediastinal and left pleural drainage tubes were placed. The sternum was closed with  #6 stainless steel wires. The fascia was closed with continuous # 1 vicryl suture. The subcutaneous tissue was closed with 2-0 vicryl continuous suture. The skin was closed with 3-0 vicryl subcuticular suture. All sponge, needle, and instrument counts were reported correct at the end of the case. Dry sterile dressings were placed over the incisions and around the chest tubes which were connected to pleurevac suction. The patient was then transported to the surgical intensive care unit in stable condition.

## 2020-05-23 NOTE — Progress Notes (Signed)
TCTS Evening Rounds   DOS s/p AVR/CABG Hemodynamically stable CT output 400 mL so far Good UOP  PE: sedated/intubated CTA RRR  CXR mild left pleural opacity  INR 1.6  Hct 27 on abg  A/p: continue to support 2 u FFP 1 u PRBC  Will monitor.  [name] [name] Z. Orvan Seen, [place]

## 2020-05-23 NOTE — Anesthesia Procedure Notes (Addendum)
Arterial Line Insertion Start/End11/26/2021 6:35 AM, 05/23/2020 6:45 AM Performed by: Lillia Abed, MD, Reece Agar, CRNA, CRNA  Patient location: Pre-op. Preanesthetic checklist: patient identified, IV checked, site marked, risks and benefits discussed, surgical consent, monitors and equipment checked, pre-op evaluation, timeout performed and anesthesia consent Lidocaine 1% used for infiltration Left, radial was placed Catheter size: 20 G Hand hygiene performed  and maximum sterile barriers used  Allen's test indicative of satisfactory collateral circulation Attempts: 1 Procedure performed without using ultrasound guided technique. Following insertion, dressing applied and Biopatch. Post procedure assessment: normal and unchanged

## 2020-05-23 NOTE — Anesthesia Procedure Notes (Signed)
Central Venous Catheter Insertion Performed by: Lillia Abed, MD, anesthesiologist Start/End11/26/2021 7:00 AM, 05/23/2020 7:10 AM Patient location: Pre-op. Preanesthetic checklist: patient identified, IV checked, risks and benefits discussed, surgical consent, monitors and equipment checked, pre-op evaluation, timeout performed and anesthesia consent Position: Trendelenburg Patient sedated Hand hygiene performed  and maximum sterile barriers used  Catheter size: 8.5 Fr PA cath was placed.Sheath introducer Procedure performed using ultrasound guided technique. Ultrasound Notes:anatomy identified, needle tip was noted to be adjacent to the nerve/plexus identified, no ultrasound evidence of intravascular and/or intraneural injection and image(s) printed for medical record Attempts: 1 Following insertion, line sutured and dressing applied. Post procedure assessment: blood return through all ports, free fluid flow and no air  Patient tolerated the procedure well with no immediate complications.

## 2020-05-23 NOTE — Anesthesia Procedure Notes (Signed)
Procedure Name: Intubation Date/Time: 05/23/2020 7:58 AM Performed by: Reece Agar, CRNA Pre-anesthesia Checklist: Patient identified, Emergency Drugs available, Suction available and Patient being monitored Patient Re-evaluated:Patient Re-evaluated prior to induction Oxygen Delivery Method: Circle System Utilized Preoxygenation: Pre-oxygenation with 100% oxygen Induction Type: IV induction Ventilation: Mask ventilation without difficulty and Oral airway inserted - appropriate to patient size Laryngoscope Size: Glidescope and 4 Grade View: Grade I Tube type: Oral Number of attempts: 1 Airway Equipment and Method: Stylet Placement Confirmation: ETT inserted through vocal cords under direct vision,  positive ETCO2 and breath sounds checked- equal and bilateral Secured at: 23 cm Tube secured with: Tape Dental Injury: Teeth and Oropharynx as per pre-operative assessment

## 2020-05-24 ENCOUNTER — Inpatient Hospital Stay (HOSPITAL_COMMUNITY): Payer: Medicare HMO

## 2020-05-24 LAB — PREPARE FRESH FROZEN PLASMA
Unit division: 0
Unit division: 0
Unit division: 0

## 2020-05-24 LAB — GLUCOSE, CAPILLARY
Glucose-Capillary: 125 mg/dL — ABNORMAL HIGH (ref 70–99)
Glucose-Capillary: 127 mg/dL — ABNORMAL HIGH (ref 70–99)
Glucose-Capillary: 133 mg/dL — ABNORMAL HIGH (ref 70–99)
Glucose-Capillary: 140 mg/dL — ABNORMAL HIGH (ref 70–99)
Glucose-Capillary: 141 mg/dL — ABNORMAL HIGH (ref 70–99)
Glucose-Capillary: 145 mg/dL — ABNORMAL HIGH (ref 70–99)
Glucose-Capillary: 146 mg/dL — ABNORMAL HIGH (ref 70–99)
Glucose-Capillary: 146 mg/dL — ABNORMAL HIGH (ref 70–99)
Glucose-Capillary: 147 mg/dL — ABNORMAL HIGH (ref 70–99)
Glucose-Capillary: 155 mg/dL — ABNORMAL HIGH (ref 70–99)
Glucose-Capillary: 158 mg/dL — ABNORMAL HIGH (ref 70–99)
Glucose-Capillary: 159 mg/dL — ABNORMAL HIGH (ref 70–99)
Glucose-Capillary: 162 mg/dL — ABNORMAL HIGH (ref 70–99)
Glucose-Capillary: 164 mg/dL — ABNORMAL HIGH (ref 70–99)
Glucose-Capillary: 165 mg/dL — ABNORMAL HIGH (ref 70–99)
Glucose-Capillary: 165 mg/dL — ABNORMAL HIGH (ref 70–99)
Glucose-Capillary: 166 mg/dL — ABNORMAL HIGH (ref 70–99)
Glucose-Capillary: 169 mg/dL — ABNORMAL HIGH (ref 70–99)
Glucose-Capillary: 181 mg/dL — ABNORMAL HIGH (ref 70–99)
Glucose-Capillary: 92 mg/dL (ref 70–99)

## 2020-05-24 LAB — URINE CULTURE: Special Requests: NORMAL

## 2020-05-24 LAB — PREPARE PLATELET PHERESIS: Unit division: 0

## 2020-05-24 LAB — PREPARE RBC (CROSSMATCH)

## 2020-05-24 LAB — BPAM FFP
Blood Product Expiration Date: 202111262359
Blood Product Expiration Date: 202111262359
Blood Product Expiration Date: 202112012359
Blood Product Expiration Date: 202112012359
ISSUE DATE / TIME: 202111261419
ISSUE DATE / TIME: 202111261419
ISSUE DATE / TIME: 202111261720
ISSUE DATE / TIME: 202111261720
Unit Type and Rh: 6200
Unit Type and Rh: 6200
Unit Type and Rh: 6200
Unit Type and Rh: 6200

## 2020-05-24 LAB — BPAM CRYOPRECIPITATE
Blood Product Expiration Date: 202111262353
Blood Product Expiration Date: 202111262353
ISSUE DATE / TIME: 202111261832
ISSUE DATE / TIME: 202111261832
Unit Type and Rh: 6200
Unit Type and Rh: 6200

## 2020-05-24 LAB — COMPREHENSIVE METABOLIC PANEL
ALT: 19 U/L (ref 0–44)
AST: 49 U/L — ABNORMAL HIGH (ref 15–41)
Albumin: 3.4 g/dL — ABNORMAL LOW (ref 3.5–5.0)
Alkaline Phosphatase: 34 U/L — ABNORMAL LOW (ref 38–126)
Anion gap: 13 (ref 5–15)
BUN: 20 mg/dL (ref 8–23)
CO2: 19 mmol/L — ABNORMAL LOW (ref 22–32)
Calcium: 8.1 mg/dL — ABNORMAL LOW (ref 8.9–10.3)
Chloride: 101 mmol/L (ref 98–111)
Creatinine, Ser: 1.16 mg/dL (ref 0.61–1.24)
GFR, Estimated: 60 mL/min — ABNORMAL LOW (ref >60)
Glucose, Bld: 211 mg/dL — ABNORMAL HIGH (ref 70–99)
Potassium: 4.2 mmol/L (ref 3.5–5.1)
Sodium: 133 mmol/L — ABNORMAL LOW (ref 135–145)
Total Bilirubin: 1.3 mg/dL — ABNORMAL HIGH (ref 0.3–1.2)
Total Protein: 5.5 g/dL — ABNORMAL LOW (ref 6.5–8.1)

## 2020-05-24 LAB — CBC
HCT: 24.4 % — ABNORMAL LOW (ref 39.0–52.0)
HCT: 26 % — ABNORMAL LOW (ref 39.0–52.0)
Hemoglobin: 8.1 g/dL — ABNORMAL LOW (ref 13.0–17.0)
Hemoglobin: 8.9 g/dL — ABNORMAL LOW (ref 13.0–17.0)
MCH: 31 pg (ref 26.0–34.0)
MCH: 31.1 pg (ref 26.0–34.0)
MCHC: 33.2 g/dL (ref 30.0–36.0)
MCHC: 34.2 g/dL (ref 30.0–36.0)
MCV: 93.5 fL (ref 80.0–100.0)
MCV: 90.9 fL (ref 80.0–100.0)
Platelets: 98 10*3/uL — ABNORMAL LOW (ref 150–400)
Platelets: 92 10*3/uL — ABNORMAL LOW (ref 150–400)
RBC: 2.61 MIL/uL — ABNORMAL LOW (ref 4.22–5.81)
RBC: 2.86 MIL/uL — ABNORMAL LOW (ref 4.22–5.81)
RDW: 14.4 % (ref 11.5–15.5)
RDW: 14.6 % (ref 11.5–15.5)
WBC: 9.3 10*3/uL (ref 4.0–10.5)
WBC: 12 10*3/uL — ABNORMAL HIGH (ref 4.0–10.5)
nRBC: 0 % (ref 0.0–0.2)
nRBC: 0 % (ref 0.0–0.2)

## 2020-05-24 LAB — POCT I-STAT 7, (LYTES, BLD GAS, ICA,H+H)
Acid-base deficit: 2 mmol/L (ref 0.0–2.0)
Acid-base deficit: 1 mmol/L (ref 0.0–2.0)
Acid-base deficit: 1 mmol/L (ref 0.0–2.0)
Bicarbonate: 23.7 mmol/L (ref 20.0–28.0)
Bicarbonate: 23.8 mmol/L (ref 20.0–28.0)
Bicarbonate: 23.2 mmol/L (ref 20.0–28.0)
Calcium, Ion: 1.14 mmol/L — ABNORMAL LOW (ref 1.15–1.40)
Calcium, Ion: 1.18 mmol/L (ref 1.15–1.40)
Calcium, Ion: 1.18 mmol/L (ref 1.15–1.40)
HCT: 29 % — ABNORMAL LOW (ref 39.0–52.0)
HCT: 26 % — ABNORMAL LOW (ref 39.0–52.0)
HCT: 24 % — ABNORMAL LOW (ref 39.0–52.0)
Hemoglobin: 9.9 g/dL — ABNORMAL LOW (ref 13.0–17.0)
Hemoglobin: 8.8 g/dL — ABNORMAL LOW (ref 13.0–17.0)
Hemoglobin: 8.2 g/dL — ABNORMAL LOW (ref 13.0–17.0)
O2 Saturation: 95 %
O2 Saturation: 98 %
O2 Saturation: 95 %
Patient temperature: 35.9
Patient temperature: 36.4
Patient temperature: 36.5
Potassium: 3.8 mmol/L (ref 3.5–5.1)
Potassium: 4.3 mmol/L (ref 3.5–5.1)
Potassium: 4.3 mmol/L (ref 3.5–5.1)
Sodium: 143 mmol/L (ref 135–145)
Sodium: 141 mmol/L (ref 135–145)
Sodium: 141 mmol/L (ref 135–145)
TCO2: 25 mmol/L (ref 22–32)
TCO2: 25 mmol/L (ref 22–32)
TCO2: 24 mmol/L (ref 22–32)
pCO2 arterial: 42.2 mmHg (ref 32.0–48.0)
pCO2 arterial: 37.5 mmHg (ref 32.0–48.0)
pCO2 arterial: 36.4 mmHg (ref 32.0–48.0)
pH, Arterial: 7.353 (ref 7.350–7.450)
pH, Arterial: 7.407 (ref 7.350–7.450)
pH, Arterial: 7.411 (ref 7.350–7.450)
pO2, Arterial: 73 mmHg — ABNORMAL LOW (ref 83.0–108.0)
pO2, Arterial: 111 mmHg — ABNORMAL HIGH (ref 83.0–108.0)
pO2, Arterial: 75 mmHg — ABNORMAL LOW (ref 83.0–108.0)

## 2020-05-24 LAB — BASIC METABOLIC PANEL
Anion gap: 12 (ref 5–15)
BUN: 19 mg/dL (ref 8–23)
CO2: 22 mmol/L (ref 22–32)
Calcium: 8.2 mg/dL — ABNORMAL LOW (ref 8.9–10.3)
Chloride: 105 mmol/L (ref 98–111)
Creatinine, Ser: 1.11 mg/dL (ref 0.61–1.24)
GFR, Estimated: >60 mL/min (ref >60)
Glucose, Bld: 158 mg/dL — ABNORMAL HIGH (ref 70–99)
Potassium: 4.5 mmol/L (ref 3.5–5.1)
Sodium: 139 mmol/L (ref 135–145)

## 2020-05-24 LAB — COOXEMETRY PANEL
Carboxyhemoglobin: 1.8 % — ABNORMAL HIGH (ref 0.5–1.5)
Methemoglobin: 1.1 % (ref 0.0–1.5)
O2 Saturation: 60.1 %
Total hemoglobin: 7.7 g/dL — ABNORMAL LOW (ref 12.0–16.0)

## 2020-05-24 LAB — BPAM PLATELET PHERESIS
Blood Product Expiration Date: 202111272359
ISSUE DATE / TIME: 202111261301
Unit Type and Rh: 6200

## 2020-05-24 LAB — PREPARE CRYOPRECIPITATE
Unit division: 0
Unit division: 0

## 2020-05-24 LAB — MAGNESIUM
Magnesium: 2.6 mg/dL — ABNORMAL HIGH (ref 1.7–2.4)
Magnesium: 2.4 mg/dL (ref 1.7–2.4)

## 2020-05-24 MED ORDER — ASPIRIN EC 81 MG PO TBEC
81.0000 mg | DELAYED_RELEASE_TABLET | Freq: Every day | ORAL | Status: DC
  Administered 2020-05-24 – 2020-06-26 (×34): 81 mg via ORAL
  Filled 2020-05-24 (×35): qty 1

## 2020-05-24 MED ORDER — MILRINONE LACTATE IN DEXTROSE 20-5 MG/100ML-% IV SOLN
0.2500 ug/kg/min | INTRAVENOUS | Status: DC
  Administered 2020-05-24 – 2020-05-25 (×3): 0.25 ug/kg/min via INTRAVENOUS
  Filled 2020-05-24 (×2): qty 100

## 2020-05-24 MED ORDER — ORAL CARE MOUTH RINSE
15.0000 mL | Freq: Two times a day (BID) | OROMUCOSAL | Status: DC
  Administered 2020-05-24 – 2020-06-26 (×56): 15 mL via OROMUCOSAL

## 2020-05-24 MED ORDER — SODIUM CHLORIDE 0.9% IV SOLUTION: Freq: Once | INTRAVENOUS | Status: DC

## 2020-05-24 MED ORDER — SODIUM CHLORIDE 0.9% IV SOLUTION: Freq: Once | INTRAVENOUS | Status: AC

## 2020-05-24 MED ORDER — ACETAMINOPHEN 10 MG/ML IV SOLN
1000.0000 mg | Freq: Four times a day (QID) | INTRAVENOUS | Status: AC
  Administered 2020-05-24 – 2020-05-25 (×3): 1000 mg via INTRAVENOUS
  Filled 2020-05-24 (×3): qty 100

## 2020-05-24 MED ORDER — ACETAMINOPHEN 10 MG/ML IV SOLN
1000.0000 mg | Freq: Four times a day (QID) | INTRAVENOUS | Status: DC
  Administered 2020-05-24: 1000 mg via INTRAVENOUS
  Filled 2020-05-24 (×2): qty 100

## 2020-05-24 MED ORDER — AMIODARONE HCL IN DEXTROSE 360-4.14 MG/200ML-% IV SOLN
30.0000 mg/h | INTRAVENOUS | Status: DC
  Administered 2020-05-24: 30 mg/h via INTRAVENOUS
  Filled 2020-05-24: qty 200

## 2020-05-24 MED ORDER — QUETIAPINE FUMARATE 25 MG PO TABS
25.0000 mg | ORAL_TABLET | Freq: Every evening | ORAL | Status: DC | PRN
  Administered 2020-05-24: 25 mg via ORAL
  Filled 2020-05-24: qty 1

## 2020-05-24 MED ORDER — MIDODRINE HCL 5 MG PO TABS
10.0000 mg | ORAL_TABLET | Freq: Three times a day (TID) | ORAL | Status: DC
  Administered 2020-05-24 – 2020-05-26 (×8): 10 mg via ORAL
  Filled 2020-05-24 (×8): qty 2

## 2020-05-24 MED ORDER — AMIODARONE HCL IN DEXTROSE 360-4.14 MG/200ML-% IV SOLN
60.0000 mg/h | INTRAVENOUS | Status: AC
  Administered 2020-05-24: 60 mg/h via INTRAVENOUS
  Filled 2020-05-24 (×2): qty 200

## 2020-05-24 MED ORDER — AMIODARONE LOAD VIA INFUSION
150.0000 mg | Freq: Once | INTRAVENOUS | Status: AC
  Administered 2020-05-24: 150 mg via INTRAVENOUS
  Filled 2020-05-24: qty 83.34

## 2020-05-24 MED ORDER — MILRINONE LACTATE IN DEXTROSE 20-5 MG/100ML-% IV SOLN: 0.3000 ug/kg/min | INTRAVENOUS | Status: DC

## 2020-05-24 MED ORDER — THIAMINE HCL 100 MG/ML IJ SOLN
Freq: Once | INTRAVENOUS | Status: AC
  Filled 2020-05-24: qty 1000

## 2020-05-24 NOTE — Progress Notes (Signed)
RT assessed pt for readiness to wean to next phase of heart wean. Pt able to perform all previous task when instructed to do so by RT w/out difficulty. Pt placed on CPAP/PSV mode 10/5, 40%. Pt respiratory status stable w/no distress noted at this time. RT will continue to monitor.

## 2020-05-24 NOTE — Progress Notes (Signed)
TCTS PM Rounds  Stable day Bleeding significantly less Reasonable UOP PM labs pending  PE: BP (!) 80/55   Pulse 88   Temp 98.4 F (36.9 C)   Resp (!) 30   Ht 5\' 9"  (1.753 m)   Wt 81.2 kg   SpO2 94%   BMI 26.44 kg/m   Alert/oriented RRR CTA   Intake/Output Summary (Last 24 hours) at 05/24/2020 1515 Last data filed at 05/24/2020 1512 Gross per 24 hour  Intake 6177 ml  Output 3040 ml  Net 3137 ml    A/p: d/c dopamine; continue milrinone and NE Keep PA cath this pm Continue amiodarone load  Natalyn Szymanowski Z. Orvan Seen, [place]

## 2020-05-24 NOTE — Progress Notes (Signed)
1 Day Post-Op Procedure(s) (LRB): AORTIC VALVE REPLACEMENT (AVR) USING EDWARDS RESILIA 29 MM AORTIC VALVE. (N/A) CORONARY ARTERY BYPASS GRAFTING (CABG) USING LIMA to LAD; ENDOSCOPIC HARVESTED RIGHT GREATER SAPHENOUS VEIN: SVG to PD; SVG to SEQUENCED INTERMEDIATE to OM (N/A) TRANSESOPHAGEAL ECHOCARDIOGRAM (TEE) (N/A) ENDOVEIN HARVEST OF GREATER SAPHENOUS VEIN (Right) Subjective: No complaints  Objective: Vital signs in last 24 hours: Temp:  [95.9 F (35.5 C)-98.4 F (36.9 C)] 98.4 F (36.9 C) (11/27 0730) Pulse Rate:  [51-104] 87 (11/27 0730) Cardiac Rhythm: Atrial fibrillation (11/27 0653) Resp:  [12-48] 24 (11/27 0730) BP: (79-131)/(44-86) 101/59 (11/27 0700) SpO2:  [94 %-100 %] 95 % (11/27 0730) Arterial Line BP: (85-151)/(43-77) 113/56 (11/27 0730) FiO2 (%):  [40 %-50 %] 40 % (11/27 0041) Weight:  [81.2 kg] 81.2 kg (11/27 0600)  Hemodynamic parameters for last 24 hours: PAP: (21-38)/(4-23) 30/17 CO:  [3 L/min-5 L/min] 5 L/min CI:  [1.5 L/min/m2-2.6 L/min/m2] 2.6 L/min/m2  Intake/Output from previous day: 11/26 0701 - 11/27 0700 In: 9473.7 [I.V.:4241.3; Blood:2867.1; IV Piggyback:2365.3] Out: 6659 [Urine:4345; Blood:1092; Chest Tube:1520] Intake/Output this shift: No intake/output data recorded.  General appearance: alert and cooperative Neurologic: intact Heart: irregularly irregular rhythm Lungs: clear to auscultation bilaterally Abdomen: soft, non-tender; bowel sounds normal; no masses,  no organomegaly Extremities: extremities normal, atraumatic, no cyanosis or edema Wound: dressed, dry  Lab Results: Recent Labs    05/23/20 2310 05/24/20 0119 05/24/20 0242 05/24/20 0415  WBC 9.2  --   --  9.3  HGB 8.8*   < > 8.2* 8.1*  HCT 26.7*   < > 24.0* 24.4*  PLT 99*  --   --  98*   < > = values in this interval not displayed.   BMET:  Recent Labs    05/23/20 2310 05/24/20 0119 05/24/20 0242 05/24/20 0415  NA 138   < > 141 139  K 4.3   < > 4.3 4.5  CL 107   --   --  105  CO2 21*  --   --  22  GLUCOSE 177*  --   --  158*  BUN 19  --   --  19  CREATININE 1.14  --   --  1.11  CALCIUM 8.1*  --   --  8.2*   < > = values in this interval not displayed.    PT/INR:  Recent Labs    05/23/20 2044  LABPROT 16.1*  INR 1.3*   ABG    Component Value Date/Time   PHART 7.411 05/24/2020 0242   HCO3 23.2 05/24/2020 0242   TCO2 24 05/24/2020 0242   ACIDBASEDEF 1.0 05/24/2020 0242   O2SAT 60.1 05/24/2020 0424   CBG (last 3)  Recent Labs    05/24/20 0501 05/24/20 0609 05/24/20 0702  GLUCAP 169* 164* 155*    Assessment/Plan: S/P Procedure(s) (LRB): AORTIC VALVE REPLACEMENT (AVR) USING EDWARDS RESILIA 29 MM AORTIC VALVE. (N/A) CORONARY ARTERY BYPASS GRAFTING (CABG) USING LIMA to LAD; ENDOSCOPIC HARVESTED RIGHT GREATER SAPHENOUS VEIN: SVG to PD; SVG to SEQUENCED INTERMEDIATE to OM (N/A) TRANSESOPHAGEAL ECHOCARDIOGRAM (TEE) (N/A) ENDOVEIN HARVEST OF GREATER SAPHENOUS VEIN (Right) Mobilize amiodarone load  txfuse one prbc    LOS: 8 days    Wonda Olds 05/24/2020

## 2020-05-24 NOTE — Progress Notes (Signed)
RT assessed pt for readiness to wean. Pt able to hold head off of pillow for 20+ seconds, squeeze hand, wiggle toes, and stick out tongue when prompted to do so by RT. Pt placed in first phase of heart wean on 560/4/+5/40%. Pt respiratory status stable at this time w/no distress noted at this time. RT will reassess pt for next phase in 20 minutes. RT will continue to monitor.

## 2020-05-24 NOTE — Progress Notes (Signed)
Upon standing patient for their morning weight, patient had a vagal response and became unconscious. Patient placed back into bed by two RNs. Patient resumed consciousness within 10 seconds. Patient now alert and oriented times four. Hemodynamics within normal limits. Will continue to monitor.

## 2020-05-24 NOTE — Procedures (Signed)
Extubation Procedure Note  Patient Details:   Name: [name] [name] DOB: 10/09/1927 MRN: [id number]   Airway Documentation:    Vent end date: 05/24/20 Vent end time: 0132   Evaluation  O2 sats: stable throughout Complications: No apparent complications Patient did tolerate procedure well. Bilateral Breath Sounds: Clear, Diminished   Yes  Pt had cuff leak, NIF -30, VC 1.6 L, pt able to speak name and expectorate secretions w/out difficulty. No stridor noted post extubation. Pt placed on 4 Lpm Olds w/humidity.   Roby Lofts Maloni Musleh RRT 05/24/2020, 1:40 AM

## 2020-05-25 ENCOUNTER — Inpatient Hospital Stay (HOSPITAL_COMMUNITY): Payer: Medicare HMO

## 2020-05-25 LAB — BASIC METABOLIC PANEL
Anion gap: 9 (ref 5–15)
BUN: 18 mg/dL (ref 8–23)
CO2: 22 mmol/L (ref 22–32)
Calcium: 8.1 mg/dL — ABNORMAL LOW (ref 8.9–10.3)
Chloride: 100 mmol/L (ref 98–111)
Creatinine, Ser: 1.16 mg/dL (ref 0.61–1.24)
GFR, Estimated: 60 mL/min — ABNORMAL LOW (ref >60)
Glucose, Bld: 140 mg/dL — ABNORMAL HIGH (ref 70–99)
Potassium: 3.9 mmol/L (ref 3.5–5.1)
Sodium: 131 mmol/L — ABNORMAL LOW (ref 135–145)

## 2020-05-25 LAB — CBC
HCT: 24.3 % — ABNORMAL LOW (ref 39.0–52.0)
Hemoglobin: 8.1 g/dL — ABNORMAL LOW (ref 13.0–17.0)
MCH: 30.5 pg (ref 26.0–34.0)
MCHC: 33.3 g/dL (ref 30.0–36.0)
MCV: 91.4 fL (ref 80.0–100.0)
Platelets: 79 10*3/uL — ABNORMAL LOW (ref 150–400)
RBC: 2.66 MIL/uL — ABNORMAL LOW (ref 4.22–5.81)
RDW: 14.7 % (ref 11.5–15.5)
WBC: 10.5 10*3/uL (ref 4.0–10.5)
nRBC: 0 % (ref 0.0–0.2)

## 2020-05-25 LAB — GLUCOSE, CAPILLARY
Glucose-Capillary: 104 mg/dL — ABNORMAL HIGH (ref 70–99)
Glucose-Capillary: 114 mg/dL — ABNORMAL HIGH (ref 70–99)
Glucose-Capillary: 118 mg/dL — ABNORMAL HIGH (ref 70–99)
Glucose-Capillary: 119 mg/dL — ABNORMAL HIGH (ref 70–99)
Glucose-Capillary: 122 mg/dL — ABNORMAL HIGH (ref 70–99)
Glucose-Capillary: 125 mg/dL — ABNORMAL HIGH (ref 70–99)
Glucose-Capillary: 128 mg/dL — ABNORMAL HIGH (ref 70–99)
Glucose-Capillary: 128 mg/dL — ABNORMAL HIGH (ref 70–99)
Glucose-Capillary: 129 mg/dL — ABNORMAL HIGH (ref 70–99)
Glucose-Capillary: 130 mg/dL — ABNORMAL HIGH (ref 70–99)
Glucose-Capillary: 131 mg/dL — ABNORMAL HIGH (ref 70–99)
Glucose-Capillary: 139 mg/dL — ABNORMAL HIGH (ref 70–99)
Glucose-Capillary: 142 mg/dL — ABNORMAL HIGH (ref 70–99)
Glucose-Capillary: 142 mg/dL — ABNORMAL HIGH (ref 70–99)
Glucose-Capillary: 143 mg/dL — ABNORMAL HIGH (ref 70–99)
Glucose-Capillary: 146 mg/dL — ABNORMAL HIGH (ref 70–99)
Glucose-Capillary: 156 mg/dL — ABNORMAL HIGH (ref 70–99)
Glucose-Capillary: 160 mg/dL — ABNORMAL HIGH (ref 70–99)
Glucose-Capillary: 182 mg/dL — ABNORMAL HIGH (ref 70–99)
Glucose-Capillary: 194 mg/dL — ABNORMAL HIGH (ref 70–99)
Glucose-Capillary: 203 mg/dL — ABNORMAL HIGH (ref 70–99)

## 2020-05-25 LAB — COOXEMETRY PANEL
Carboxyhemoglobin: 1.3 % (ref 0.5–1.5)
Methemoglobin: 0.8 % (ref 0.0–1.5)
O2 Saturation: 60.5 %
Total hemoglobin: 8.4 g/dL — ABNORMAL LOW (ref 12.0–16.0)

## 2020-05-25 MED ORDER — MILRINONE LACTATE IN DEXTROSE 20-5 MG/100ML-% IV SOLN
0.1250 ug/kg/min | INTRAVENOUS | Status: DC
  Administered 2020-05-25: 0.125 ug/kg/min via INTRAVENOUS
  Filled 2020-05-25 (×2): qty 100

## 2020-05-25 MED ORDER — AMIODARONE HCL 200 MG PO TABS
400.0000 mg | ORAL_TABLET | Freq: Two times a day (BID) | ORAL | Status: DC
  Administered 2020-05-25 – 2020-05-28 (×8): 400 mg via ORAL
  Filled 2020-05-25 (×8): qty 2

## 2020-05-25 MED ORDER — FUROSEMIDE 10 MG/ML IJ SOLN
40.0000 mg | Freq: Two times a day (BID) | INTRAMUSCULAR | Status: DC
  Administered 2020-05-25 (×2): 40 mg via INTRAVENOUS
  Filled 2020-05-25 (×2): qty 4

## 2020-05-25 NOTE — Anesthesia Postprocedure Evaluation (Signed)
Anesthesia Post Note  Patient: [name] [name]  Procedure(s) Performed: AORTIC VALVE REPLACEMENT (AVR) USING EDWARDS RESILIA 29 MM AORTIC VALVE. (N/A Chest) CORONARY ARTERY BYPASS GRAFTING (CABG) USING LIMA to LAD; ENDOSCOPIC HARVESTED RIGHT GREATER SAPHENOUS VEIN: SVG to PD; SVG to SEQUENCED INTERMEDIATE to OM (N/A Chest) TRANSESOPHAGEAL ECHOCARDIOGRAM (TEE) (N/A ) ENDOVEIN HARVEST OF GREATER SAPHENOUS VEIN (Right Leg Upper)     Patient location during evaluation: SICU Anesthesia Type: General Level of consciousness: sedated Pain management: pain level controlled Vital Signs Assessment: post-procedure vital signs reviewed and stable Respiratory status: patient remains intubated per anesthesia plan Cardiovascular status: stable Postop Assessment: no apparent nausea or vomiting Anesthetic complications: no   No complications documented.  Last Vitals:  Vitals:   05/25/20 1200 05/25/20 1300  BP: (!) 88/61 100/66  Pulse: 96 91  Resp: (!) 26 16  Temp: 36.9 C 37 C  SpO2: 92% 91%    Last Pain:  Vitals:   05/25/20 1200  TempSrc: Bladder  PainSc: 3                  [place]and[place] Current DAVID

## 2020-05-25 NOTE — Progress Notes (Addendum)
Pt had syncopal episode during transfer BED to Chair today.  AF w/PVCs - rate stable.  Pulses maintained- MAP >65  (see Epic for VS).  Pt face blanched, unable to answer simple questions or follow simple commands, knees buckled, started to lose balance needing full support of 3 staff members (no fall!), unable to maintain eye contact, eyes fluttered back.    Assisted back to chair. VS still stable upon seated position.  LOC improved within 2-3 minutes of sitting.  Transfer completed successfully w/o incident w/3 staff - slide to bed.     195 CT output. Verified Atkins to remove.  CT removed- sites CDI.  [place] D/C per orders, introducer in place.   Endotool continued for insulin gtt.  Levophed gtt, Milrinone gtt cont. Amio gtt switched to PO-Tolerating  Compliant w/ incentive spirometer Tolerating diet w/o complaints of N/V -no BM yet Foley in place for strict I/O and aggressive diuresing   Pt in NAD, VSS.

## 2020-05-25 NOTE — Progress Notes (Signed)
2 Days Post-Op Procedure(s) (LRB): AORTIC VALVE REPLACEMENT (AVR) USING EDWARDS RESILIA 29 MM AORTIC VALVE. (N/A) CORONARY ARTERY BYPASS GRAFTING (CABG) USING LIMA to LAD; ENDOSCOPIC HARVESTED RIGHT GREATER SAPHENOUS VEIN: SVG to PD; SVG to SEQUENCED INTERMEDIATE to OM (N/A) TRANSESOPHAGEAL ECHOCARDIOGRAM (TEE) (N/A) ENDOVEIN HARVEST OF GREATER SAPHENOUS VEIN (Right) Subjective: Pain with cough  Objective: Vital signs in last 24 hours: Temp:  [97.5 F (36.4 C)-98.6 F (37 C)] 97.5 F (36.4 C) (11/28 0700) Pulse Rate:  [67-111] 98 (11/28 0700) Cardiac Rhythm: Atrial fibrillation (11/27 2000) Resp:  [14-59] 19 (11/28 0700) BP: (80-118)/(55-76) 116/74 (11/28 0700) SpO2:  [86 %-98 %] 94 % (11/28 0700) Arterial Line BP: (86-163)/(43-80) 123/58 (11/28 0700) Weight:  [85.3 kg] 85.3 kg (11/28 0600)  Hemodynamic parameters for last 24 hours: PAP: (26-45)/(9-22) 38/11 CO:  [4 L/min] 4 L/min CI:  [2.1 L/min/m2] 2.1 L/min/m2  Intake/Output from previous day: 11/27 0701 - 11/28 0700 In: 5434.3 [P.O.:840; I.V.:3672.2; Blood:315; IV Piggyback:607.2] Out: 1330 [Urine:830; Chest Tube:500] Intake/Output this shift: No intake/output data recorded.  General appearance: alert and cooperative Neurologic: intact Heart: regular rate and rhythm, S1, S2 normal, no murmur, click, rub or gallop Lungs: clear to auscultation bilaterally Abdomen: soft, non-tender; bowel sounds normal; no masses,  no organomegaly Extremities: edema mild Wound: dressed, dry  Lab Results: Recent Labs    05/24/20 1546 05/25/20 0318  WBC 12.0* 10.5  HGB 8.9* 8.1*  HCT 26.0* 24.3*  PLT 92* 79*   BMET:  Recent Labs    05/24/20 1546 05/25/20 0318  NA 133* 131*  K 4.2 3.9  CL 101 100  CO2 19* 22  GLUCOSE 211* 140*  BUN 20 18  CREATININE 1.16 1.16  CALCIUM 8.1* 8.1*    PT/INR:  Recent Labs    05/23/20 2044  LABPROT 16.1*  INR 1.3*   ABG    Component Value Date/Time   PHART 7.411 05/24/2020 0242    HCO3 23.2 05/24/2020 0242   TCO2 24 05/24/2020 0242   ACIDBASEDEF 1.0 05/24/2020 0242   O2SAT 60.5 05/25/2020 0318   CBG (last 3)  Recent Labs    05/25/20 0313 05/25/20 0417 05/25/20 0616  GLUCAP 131* 129* 128*    Assessment/Plan: S/P Procedure(s) (LRB): AORTIC VALVE REPLACEMENT (AVR) USING EDWARDS RESILIA 29 MM AORTIC VALVE. (N/A) CORONARY ARTERY BYPASS GRAFTING (CABG) USING LIMA to LAD; ENDOSCOPIC HARVESTED RIGHT GREATER SAPHENOUS VEIN: SVG to PD; SVG to SEQUENCED INTERMEDIATE to OM (N/A) TRANSESOPHAGEAL ECHOCARDIOGRAM (TEE) (N/A) ENDOVEIN HARVEST OF GREATER SAPHENOUS VEIN (Right) Mobilize Diuresis d/c tubes/lines  Decrease milrinone drip Amiodarone to po   LOS: 9 days    Wonda Olds 05/25/2020

## 2020-05-26 ENCOUNTER — Inpatient Hospital Stay (HOSPITAL_COMMUNITY): Payer: Medicare HMO

## 2020-05-26 LAB — BPAM RBC
Blood Product Expiration Date: 202112252359
Blood Product Expiration Date: 202112262359
Blood Product Expiration Date: 202112262359
Blood Product Expiration Date: 202112262359
ISSUE DATE / TIME: 202111261655
ISSUE DATE / TIME: 202111262022
ISSUE DATE / TIME: 202111270812
Unit Type and Rh: 6200
Unit Type and Rh: 6200
Unit Type and Rh: 6200
Unit Type and Rh: 6200

## 2020-05-26 LAB — TYPE AND SCREEN
ABO/RH(D): A POS
Antibody Screen: NEGATIVE
Unit division: 0
Unit division: 0
Unit division: 0
Unit division: 0

## 2020-05-26 LAB — GLUCOSE, CAPILLARY
Glucose-Capillary: 117 mg/dL — ABNORMAL HIGH (ref 70–99)
Glucose-Capillary: 124 mg/dL — ABNORMAL HIGH (ref 70–99)
Glucose-Capillary: 121 mg/dL — ABNORMAL HIGH (ref 70–99)
Glucose-Capillary: 111 mg/dL — ABNORMAL HIGH (ref 70–99)
Glucose-Capillary: 173 mg/dL — ABNORMAL HIGH (ref 70–99)
Glucose-Capillary: 179 mg/dL — ABNORMAL HIGH (ref 70–99)
Glucose-Capillary: 144 mg/dL — ABNORMAL HIGH (ref 70–99)
Glucose-Capillary: 152 mg/dL — ABNORMAL HIGH (ref 70–99)

## 2020-05-26 LAB — POCT I-STAT 7, (LYTES, BLD GAS, ICA,H+H)
Acid-base deficit: 2 mmol/L (ref 0.0–2.0)
Bicarbonate: 22.2 mmol/L (ref 20.0–28.0)
Calcium, Ion: 1.18 mmol/L (ref 1.15–1.40)
HCT: 26 % — ABNORMAL LOW (ref 39.0–52.0)
Hemoglobin: 8.8 g/dL — ABNORMAL LOW (ref 13.0–17.0)
O2 Saturation: 95 %
Patient temperature: 99.8
Potassium: 3.8 mmol/L (ref 3.5–5.1)
Sodium: 127 mmol/L — ABNORMAL LOW (ref 135–145)
TCO2: 23 mmol/L (ref 22–32)
pCO2 arterial: 37.8 mmHg (ref 32.0–48.0)
pH, Arterial: 7.381 (ref 7.350–7.450)
pO2, Arterial: 82 mmHg — ABNORMAL LOW (ref 83.0–108.0)

## 2020-05-26 LAB — BASIC METABOLIC PANEL
Anion gap: 8 (ref 5–15)
BUN: 24 mg/dL — ABNORMAL HIGH (ref 8–23)
CO2: 23 mmol/L (ref 22–32)
Calcium: 8.1 mg/dL — ABNORMAL LOW (ref 8.9–10.3)
Chloride: 97 mmol/L — ABNORMAL LOW (ref 98–111)
Creatinine, Ser: 1.32 mg/dL — ABNORMAL HIGH (ref 0.61–1.24)
GFR, Estimated: 51 mL/min — ABNORMAL LOW (ref >60)
Glucose, Bld: 121 mg/dL — ABNORMAL HIGH (ref 70–99)
Potassium: 4 mmol/L (ref 3.5–5.1)
Sodium: 128 mmol/L — ABNORMAL LOW (ref 135–145)

## 2020-05-26 LAB — CBC
HCT: 24.4 % — ABNORMAL LOW (ref 39.0–52.0)
Hemoglobin: 8.2 g/dL — ABNORMAL LOW (ref 13.0–17.0)
MCH: 31.4 pg (ref 26.0–34.0)
MCHC: 33.6 g/dL (ref 30.0–36.0)
MCV: 93.5 fL (ref 80.0–100.0)
Platelets: 87 10*3/uL — ABNORMAL LOW (ref 150–400)
RBC: 2.61 MIL/uL — ABNORMAL LOW (ref 4.22–5.81)
RDW: 14.5 % (ref 11.5–15.5)
WBC: 8.9 10*3/uL (ref 4.0–10.5)
nRBC: 0 % (ref 0.0–0.2)

## 2020-05-26 LAB — COOXEMETRY PANEL
Carboxyhemoglobin: 1.2 % (ref 0.5–1.5)
Methemoglobin: 0.8 % (ref 0.0–1.5)
O2 Saturation: 65.2 %
Total hemoglobin: 8.4 g/dL — ABNORMAL LOW (ref 12.0–16.0)

## 2020-05-26 LAB — MAGNESIUM: Magnesium: 2 mg/dL (ref 1.7–2.4)

## 2020-05-26 LAB — SURGICAL PATHOLOGY

## 2020-05-26 MED ORDER — INSULIN ASPART 100 UNIT/ML ~~LOC~~ SOLN: 0.0000 [IU] | Freq: Three times a day (TID) | SUBCUTANEOUS | Status: DC

## 2020-05-26 MED ORDER — POTASSIUM CHLORIDE CRYS ER 20 MEQ PO TBCR
20.0000 meq | EXTENDED_RELEASE_TABLET | Freq: Two times a day (BID) | ORAL | Status: DC
  Administered 2020-05-26 – 2020-05-27 (×3): 20 meq via ORAL
  Filled 2020-05-26 (×4): qty 1

## 2020-05-26 MED ORDER — DOPAMINE-DEXTROSE 3.2-5 MG/ML-% IV SOLN
3.0000 ug/kg/min | INTRAVENOUS | Status: DC
  Administered 2020-05-26 – 2020-06-01 (×4): 3 ug/kg/min via INTRAVENOUS
  Filled 2020-05-26 (×4): qty 250

## 2020-05-26 MED ORDER — INSULIN ASPART 100 UNIT/ML ~~LOC~~ SOLN
0.0000 [IU] | Freq: Three times a day (TID) | SUBCUTANEOUS | Status: DC
  Administered 2020-05-26 (×2): 3 [IU] via SUBCUTANEOUS
  Administered 2020-05-26 – 2020-05-27 (×4): 2 [IU] via SUBCUTANEOUS
  Administered 2020-05-28: 3 [IU] via SUBCUTANEOUS
  Administered 2020-05-28: 2 [IU] via SUBCUTANEOUS
  Administered 2020-05-29 (×2): 3 [IU] via SUBCUTANEOUS
  Administered 2020-05-29 – 2020-05-30 (×3): 2 [IU] via SUBCUTANEOUS
  Administered 2020-05-30: 3 [IU] via SUBCUTANEOUS
  Administered 2020-05-31 – 2020-06-01 (×2): 2 [IU] via SUBCUTANEOUS
  Administered 2020-06-02 (×2): 3 [IU] via SUBCUTANEOUS
  Administered 2020-06-03: 2 [IU] via SUBCUTANEOUS
  Administered 2020-06-03: 3 [IU] via SUBCUTANEOUS
  Administered 2020-06-03: 2 [IU] via SUBCUTANEOUS
  Administered 2020-06-04: 3 [IU] via SUBCUTANEOUS
  Administered 2020-06-04: 2 [IU] via SUBCUTANEOUS
  Administered 2020-06-04: 3 [IU] via SUBCUTANEOUS

## 2020-05-26 MED ORDER — BISACODYL 10 MG RE SUPP
10.0000 mg | Freq: Once | RECTAL | Status: AC
  Administered 2020-05-26: 10 mg via RECTAL
  Filled 2020-05-26: qty 1

## 2020-05-26 MED ORDER — FE FUMARATE-B12-VIT C-FA-IFC PO CAPS
1.0000 | ORAL_CAPSULE | Freq: Two times a day (BID) | ORAL | Status: DC
  Administered 2020-05-26 – 2020-06-26 (×61): 1 via ORAL
  Filled 2020-05-26 (×65): qty 1

## 2020-05-26 MED ORDER — METOCLOPRAMIDE HCL 5 MG/ML IJ SOLN
10.0000 mg | Freq: Four times a day (QID) | INTRAMUSCULAR | Status: AC
  Administered 2020-05-26 – 2020-05-27 (×3): 10 mg via INTRAVENOUS
  Filled 2020-05-26 (×3): qty 2

## 2020-05-26 MED ORDER — NOREPINEPHRINE 16 MG/250ML-% IV SOLN
0.0000 ug/min | INTRAVENOUS | Status: DC
  Administered 2020-05-26: 2 ug/min via INTRAVENOUS
  Administered 2020-05-27: 10 ug/min via INTRAVENOUS
  Administered 2020-05-29: 5 ug/min via INTRAVENOUS
  Filled 2020-05-26 (×3): qty 250

## 2020-05-26 MED ORDER — TORSEMIDE 20 MG PO TABS
40.0000 mg | ORAL_TABLET | Freq: Every day | ORAL | Status: DC
  Administered 2020-05-26: 40 mg via ORAL
  Filled 2020-05-26 (×2): qty 2

## 2020-05-26 MED ORDER — MIDODRINE HCL 5 MG PO TABS
10.0000 mg | ORAL_TABLET | Freq: Three times a day (TID) | ORAL | Status: DC
  Administered 2020-05-26 – 2020-06-17 (×66): 10 mg via ORAL
  Filled 2020-05-26 (×66): qty 2

## 2020-05-26 MED FILL — Thrombin (Recombinant) For Soln 20000 Unit: CUTANEOUS | Qty: 1 | Status: AC

## 2020-05-26 NOTE — Evaluation (Signed)
Occupational Therapy Evaluation Patient Details Name: [name] [name] MRN: [id number] DOB: 1928/03/11 Today's Date: 05/26/2020    History of Present Illness 86 yo male presenting to PCP with worsening LE edema and dyspnea. S/p right/left heart cath and coronary angiography on 11/22. S/p CABG x4 on 11/26. PMH including venous insufficiency with chronic lower extremity edema (mostly on the left), hypertension, hyperlipidemia, peripheral neuropathy, Bil TKA,  essential tremor, and prostate cancer.   Clinical Impression   PTA, pt was living with his wife and was independent with ADLs. Pt currently requiring Mod-Max A for ADLs and presenting with fatigue, nausea, limited strength, and poor activity tolerance. Initiating education on sternal precautions. BP reclined with BLEs elevated 103/67 (80) and sitting upright with BLEs lowered 111/70 (81). Facilitating BUE/BLE AROM and AAROM exercises at recliner. Deferred functional transfers at this time as RN adjusting pacemaker; bradycardia and PVCs. Pt would benefit from further acute OT to facilitate safe dc. Recommend dc to SNF for further OT to optimize safety, independence with ADLs, and return to PLOF.     Follow Up Recommendations  SNF   Equipment Recommendations  3 in 1 bedside commode    Recommendations for Other Services PT consult     Precautions / Restrictions Precautions Precautions: Sternal Precaution Comments: Reviewed sternal precautions Restrictions Weight Bearing Restrictions: Yes      Mobility Bed Mobility               General bed mobility comments: Sitting in the recliner upon arrival    Transfers                 General transfer comment: Defer for safety. Hold standing at this time. RN present and adjusting pacer.    Balance Overall balance assessment: Needs assistance Sitting-balance support: No upper extremity supported;Feet supported Sitting balance-Leahy Scale: Fair Sitting balance - Comments:  Slight posterior lean with fatigue. Able to sit without assistance Postural control: Posterior lean                                 ADL either performed or assessed with clinical judgement   ADL Overall ADL's : Needs assistance/impaired Eating/Feeding: Set up;Supervision/ safety;Sitting   Grooming: Supervision/safety;Set up;Sitting   Upper Body Bathing: Sitting;Moderate assistance   Lower Body Bathing: Moderate assistance;Sitting/lateral leans   Upper Body Dressing : Sitting;Moderate assistance   Lower Body Dressing: Maximal assistance;Sitting/lateral leans                 General ADL Comments: Defer transfer due to bradycardia, nausea, fatigue, and RN adjusting pacemaker     Vision Baseline Vision/History: Wears glasses Wears Glasses: At all times Patient Visual Report: No change from baseline       Perception     Praxis      Pertinent Vitals/Pain Pain Assessment: 0-10 Pain Score: 8  Pain Location: chest surgical site Pain Descriptors / Indicators: Sore;Aching     Hand Dominance Right   Extremity/Trunk Assessment Upper Extremity Assessment Upper Extremity Assessment: Generalized weakness   Lower Extremity Assessment Lower Extremity Assessment: Generalized weakness   Cervical / Trunk Assessment Cervical / Trunk Assessment: Kyphotic   Communication Communication Communication: HOH   Cognition Arousal/Alertness: Awake/alert Behavior During Therapy: WFL for tasks assessed/performed Overall Cognitive Status: Within Functional Limits for tasks assessed  General Comments  SpO2 96-88% on 2L. HR 90s. BP sitting with BLEs elevated 103/67 (80). BP sitting upright 111/70 (81)    Exercises Exercises: General Lower Extremity;General Upper Extremity General Exercises - Upper Extremity Elbow Flexion: AROM;Both;10 reps;Seated Elbow Extension: AROM;Both;10 reps;Seated General Exercises - Lower  Extremity Long Arc Quad: AAROM;Both;10 reps;Seated Hip ABduction/ADduction: AAROM;Both;10 reps;Seated Hip Flexion/Marching: 10 reps;AAROM;Seated;Both   Shoulder Instructions      Home Living Family/patient expects to be discharged to:: Private residence Living Arrangements: Spouse/significant other Available Help at Discharge: Family;Available 24 hours/day Type of Home: House Home Access: Stairs to enter [employer] of Steps: 3 Entrance Stairs-Rails: None Home Layout: Two level;Able to live on main level with bedroom/bathroom     Bathroom Shower/Tub: Occupational psychologist: Handicapped height     Home Equipment: Environmental consultant - 2 wheels   Additional Comments: holds on to pole to get in home      Prior Functioning/Environment Level of Independence: Needs assistance  Gait / Transfers Assistance Needed: Does not use DME ADL's / Homemaking Assistance Needed: independent with bathing and dressing    Comments: wife does IADLs and driving         OT Problem List: Decreased strength;Decreased range of motion;Decreased activity tolerance;Impaired balance (sitting and/or standing);Decreased knowledge of use of DME or AE;Decreased knowledge of precautions;Cardiopulmonary status limiting activity;Pain      OT Treatment/Interventions: Self-care/ADL training;Therapeutic exercise;Energy conservation;DME and/or AE instruction;Therapeutic activities;Patient/family education    OT Goals(Current goals can be found in the care plan section) Acute Rehab OT Goals Patient Stated Goal: "Get better and go home" OT Goal Formulation: With patient Time For Goal Achievement: 06/09/20 Potential to Achieve Goals: Good  OT Frequency: Min 2X/week   Barriers to D/C:            Co-evaluation PT/OT/SLP Co-Evaluation/Treatment: Yes Reason for Co-Treatment: To address functional/ADL transfers;For patient/therapist safety   OT goals addressed during session: ADL's and self-care       AM-PAC OT "6 Clicks" Daily Activity     Outcome Measure Help from another person eating meals?: A Little Help from another person taking care of personal grooming?: A Little Help from another person toileting, which includes using toliet, bedpan, or urinal?: A Lot Help from another person bathing (including washing, rinsing, drying)?: A Lot Help from another person to put on and taking off regular upper body clothing?: A Lot Help from another person to put on and taking off regular lower body clothing?: A Lot 6 Click Score: 14   End of Session Equipment Utilized During Treatment: Oxygen Nurse Communication: Mobility status  Activity Tolerance: Patient limited by fatigue Patient left: in chair;with call bell/phone within reach  OT Visit Diagnosis: Unsteadiness on feet (R26.81);Other abnormalities of gait and mobility (R26.89);Muscle weakness (generalized) (M62.81);Pain Pain - part of body:  (Chest)                Time: 1610-9604 OT Time Calculation (min): 39 min Charges:  OT General Charges $OT Visit: 1 Visit OT Evaluation $OT Eval Moderate Complexity: [place], OTR/L Acute Rehab Pager: [phone] Office: [place] 05/26/2020, 12:15 PM

## 2020-05-26 NOTE — Progress Notes (Signed)
3 Days Post-Op Procedure(s) (LRB): AORTIC VALVE REPLACEMENT (AVR) USING EDWARDS RESILIA 29 MM AORTIC VALVE. (N/A) CORONARY ARTERY BYPASS GRAFTING (CABG) USING LIMA to LAD; ENDOSCOPIC HARVESTED RIGHT GREATER SAPHENOUS VEIN: SVG to PD; SVG to SEQUENCED INTERMEDIATE to OM (N/A) TRANSESOPHAGEAL ECHOCARDIOGRAM (TEE) (N/A) ENDOVEIN HARVEST OF GREATER SAPHENOUS VEIN (Right) Subjective: Feels congested but working on IS. Sitting up eating. Remains on NE16 and milrinone 0.125. Co-ox 65%.  Objective: Vital signs in last 24 hours: Temp:  [97.9 F (36.6 C)-99.4 F (37.4 C)] 98.5 F (36.9 C) (11/29 0736) Pulse Rate:  [79-99] 90 (11/29 0600) Cardiac Rhythm: Ventricular paced;Atrial fibrillation (11/28 2000) Resp:  [16-32] 27 (11/29 0600) BP: (81-120)/(53-74) 120/68 (11/29 0600) SpO2:  [90 %-99 %] 96 % (11/29 0600) Arterial Line BP: (90-128)/(42-78) 127/63 (11/29 0600) Weight:  [88.1 kg] 88.1 kg (11/29 0600)  Hemodynamic parameters for last 24 hours: PAP: (35-43)/(8-16) 38/12 CO:  [4 L/min] 4 L/min CI:  [2.1 L/min/m2] 2.1 L/min/m2  Intake/Output from previous day: 11/28 0701 - 11/29 0700 In: 2980.2 [P.O.:150; I.V.:2490.2; IV Piggyback:100] Out: 1560 [Urine:1365; Chest Tube:195] Intake/Output this shift: No intake/output data recorded.  General appearance: alert and cooperative Neurologic: intact Heart: irregularly irregular rhythm Lungs: rales bilaterally Extremities: moderate anasarca Wound: incision ok  Lab Results: Recent Labs    05/25/20 0318 05/26/20 0424  WBC 10.5 8.9  HGB 8.1* 8.2*  HCT 24.3* 24.4*  PLT 79* 87*   BMET:  Recent Labs    05/25/20 0318 05/26/20 0424  NA 131* 128*  K 3.9 4.0  CL 100 97*  CO2 22 23  GLUCOSE 140* 121*  BUN 18 24*  CREATININE 1.16 1.32*  CALCIUM 8.1* 8.1*    PT/INR:  Recent Labs    05/23/20 2044  LABPROT 16.1*  INR 1.3*   ABG    Component Value Date/Time   PHART 7.411 05/24/2020 0242   HCO3 23.2 05/24/2020 0242   TCO2 24  05/24/2020 0242   ACIDBASEDEF 1.0 05/24/2020 0242   O2SAT 65.2 05/26/2020 0424   CBG (last 3)  Recent Labs    05/26/20 0208 05/26/20 0424 05/26/20 0616  GLUCAP 124* 121* 111*   CXR: pulmonary edema and atelectasis  Assessment/Plan: S/P Procedure(s) (LRB): AORTIC VALVE REPLACEMENT (AVR) USING EDWARDS RESILIA 29 MM AORTIC VALVE. (N/A) CORONARY ARTERY BYPASS GRAFTING (CABG) USING LIMA to LAD; ENDOSCOPIC HARVESTED RIGHT GREATER SAPHENOUS VEIN: SVG to PD; SVG to SEQUENCED INTERMEDIATE to OM (N/A) TRANSESOPHAGEAL ECHOCARDIOGRAM (TEE) (N/A) ENDOVEIN HARVEST OF GREATER SAPHENOUS VEIN (Right)  POD 3 Remains on NE for BP support as well as midodrine. Will DC milrinone which may be vasodilating him. He is also on Flomax which we will hold for now. He was also getting Seroquel, another vasodilator which I stopped. No BB   Postop atrial fib with controlled rate on oral amio.   Volume excess: Wt is 28 lbs over preop if accurate. Will start Demedex and replace K+  Hyponatremia with volume excess: watch closely and if any further drop may use tolvaptan.   Continue IS, OOB to chair. Would not walk on high dose levophed.  DM: glucose under good control. preop Hgb A1c was 6.2. Switch to SSI.   LOS: 10 days    Gaye Pollack 05/26/2020

## 2020-05-26 NOTE — Evaluation (Signed)
Physical Therapy Evaluation Patient Details Name: [name] [name] MRN: [id number] DOB: 10-Apr-1928 Today's Date: 05/26/2020   History of Present Illness  86 yo male presenting to PCP with worsening LE edema and dyspnea. S/p right/left heart cath and coronary angiography on 11/22. S/p CABG x4 on 11/26. PMH including venous insufficiency with chronic lower extremity edema (mostly on the left), hypertension, hyperlipidemia, peripheral neuropathy, Bil TKA,  essential tremor, and prostate cancer.  Clinical Impression  PTA pt living with wife in 2 story home with bed and bath on first floor. Pt reports household ambulation and independence with ADLs. Pt's wife provides iADLs and drives pt.  Pt with nausea, SoB, fatigue and weakness on entry Pt is limited in safe mobility today by bradycardia that is not being well paced. RN in room adjusting pace but unable to achieve satisfactory HR for proper perfusion with mobility so deferred today. Able to perform AROM in UE and AAROM in LE. PT recommending SNF level rehab to progress strengthening and mobility prior to return home PT will continue to follow acutely.     Follow Up Recommendations SNF    Equipment Recommendations  None recommended by PT (has RW at home)       Precautions / Restrictions Precautions Precautions: Sternal Precaution Comments: Reviewed sternal precautions Restrictions Weight Bearing Restrictions: Yes      Mobility  Bed Mobility               General bed mobility comments: Sitting in the recliner upon arrival    Transfers                 General transfer comment: Defer for safety. Hold standing at this time. RN present and adjusting pacer.         Balance Overall balance assessment: Needs assistance Sitting-balance support: No upper extremity supported;Feet supported Sitting balance-Leahy Scale: Fair Sitting balance - Comments: Slight posterior lean with fatigue. Able to sit without assistance Postural  control: Posterior lean                                   Pertinent Vitals/Pain Pain Assessment: 0-10 Pain Score: 8  Pain Location: chest surgical site Pain Descriptors / Indicators: Sore;Aching    Home Living Family/patient expects to be discharged to:: Private residence Living Arrangements: Spouse/significant other Available Help at Discharge: Family;Available 24 hours/day Type of Home: House Home Access: Stairs to enter Entrance Stairs-Rails: None Entrance Stairs-Number of Steps: 3 Home Layout: Two level;Able to live on main level with bedroom/bathroom Home Equipment: Walker - 2 wheels Additional Comments: holds on to pole to get in home    Prior Function Level of Independence: Needs assistance   Gait / Transfers Assistance Needed: Does not use DME  ADL's / Homemaking Assistance Needed: independent with bathing and dressing   Comments: wife does IADLs and driving      Hand Dominance   Dominant Hand: Right    Extremity/Trunk Assessment   Upper Extremity Assessment Upper Extremity Assessment: Generalized weakness    Lower Extremity Assessment Lower Extremity Assessment: Generalized weakness    Cervical / Trunk Assessment Cervical / Trunk Assessment: Kyphotic  Communication   Communication: HOH  Cognition Arousal/Alertness: Awake/alert Behavior During Therapy: WFL for tasks assessed/performed Overall Cognitive Status: Within Functional Limits for tasks assessed  General Comments General comments (skin integrity, edema, etc.): SpO2 96-88% on 2L. HR 90s. BP sitting with BLEs elevated 103/67 (80). BP sitting upright 111/70 (81)    Exercises General Exercises - Upper Extremity Elbow Flexion: AROM;Both;10 reps;Seated Elbow Extension: AROM;Both;10 reps;Seated General Exercises - Lower Extremity Long Arc Quad: AAROM;Both;10 reps;Seated Hip ABduction/ADduction: AAROM;Both;10 reps;Seated Hip  Flexion/Marching: 10 reps;AAROM;Seated;Both   Assessment/Plan    PT Assessment Patient needs continued PT services  PT Problem List Decreased strength;Decreased activity tolerance;Decreased balance;Decreased mobility;Cardiopulmonary status limiting activity;Pain       PT Treatment Interventions DME instruction;Gait training;Stair training;Functional mobility training;Therapeutic activities;Therapeutic exercise;Balance training;Patient/family education;Cognitive remediation    PT Goals (Current goals can be found in the Care Plan section)  Acute Rehab PT Goals Patient Stated Goal: "Get better and go home" PT Goal Formulation: With patient Time For Goal Achievement: 06/09/20 Potential to Achieve Goals: Fair    Frequency Min 3X/week   Barriers to discharge        Co-evaluation PT/OT/SLP Co-Evaluation/Treatment: Yes Reason for Co-Treatment: To address functional/ADL transfers PT goals addressed during session: Strengthening/ROM OT goals addressed during session: ADL's and self-care       AM-PAC PT "6 Clicks" Mobility  Outcome Measure Help needed turning from your back to your side while in a flat bed without using bedrails?: A Little Help needed moving from lying on your back to sitting on the side of a flat bed without using bedrails?: Total Help needed moving to and from a bed to a chair (including a wheelchair)?: Total Help needed standing up from a chair using your arms (e.g., wheelchair or bedside chair)?: Total Help needed to walk in hospital room?: Total Help needed climbing 3-5 steps with a railing? : Total 6 Click Score: 8    End of Session Equipment Utilized During Treatment: Oxygen Activity Tolerance: Treatment limited secondary to medical complications (Comment) (heart not pacing well with pacer) Patient left: in chair;with call bell/phone within reach;with nursing/sitter in room Nurse Communication: Other (comment) (use of Geomat if able to stand later today  ) PT Visit Diagnosis: Muscle weakness (generalized) (M62.81);Difficulty in walking, not elsewhere classified (R26.2);Other abnormalities of gait and mobility (R26.89);Pain Pain - part of body:  (sternum)    Time: 8850-2774 PT Time Calculation (min) (ACUTE ONLY): 40 min   Charges:   PT Evaluation $PT Eval Moderate Complexity: 1 Mod          Brandace Cargle B. Migdalia Dk PT, DPT Acute Rehabilitation Services Pager [phone] Office [phone]   [place] 05/26/2020, 1:18 PM

## 2020-05-26 NOTE — Progress Notes (Signed)
      [address]       [place] [address]             [phone]      POD # 3 AVR, CABG  Sleeping BP 98/66   Pulse 90   Temp 99 F (37.2 C) (Oral)   Resp 18   Ht 5\' 9"  (1.753 m)   Wt 88.1 kg   SpO2 97%   BMI 28.68 kg/m   I/O 970/635 so far today  No evening labs   Continue current Rx  Remo Lipps C. Roxan Hockey, MD [facility] [phone]

## 2020-05-26 NOTE — Plan of Care (Signed)
  Problem: Education: Goal: Knowledge of disease and its progression will improve Outcome: Progressing   Problem: Fluid Volume: Goal: Compliance with measures to maintain balanced fluid volume will improve Outcome: Progressing   Problem: Education: Goal: Knowledge of General Education information will improve Description: Including pain rating scale, medication(s)/side effects and non-pharmacologic comfort measures Outcome: Progressing   Problem: Health Behavior/Discharge Planning: Goal: Ability to manage health-related needs will improve Outcome: Progressing   Problem: Clinical Measurements: Goal: Ability to maintain clinical measurements within normal limits will improve Outcome: Progressing Goal: Will remain free from infection Outcome: Progressing Goal: Diagnostic test results will improve Outcome: Progressing Goal: Respiratory complications will improve Outcome: Progressing Goal: Cardiovascular complication will be avoided Outcome: Progressing   Problem: Activity: Goal: Risk for activity intolerance will decrease Outcome: Progressing   Problem: Nutrition: Goal: Adequate nutrition will be maintained Outcome: Progressing   Problem: Coping: Goal: Level of anxiety will decrease Outcome: Progressing   Problem: Elimination: Goal: Will not experience complications related to bowel motility Outcome: Progressing Goal: Will not experience complications related to urinary retention Outcome: Progressing   Problem: Skin Integrity: Goal: Risk for impaired skin integrity will decrease Outcome: Progressing   Problem: Education: Goal: Ability to demonstrate management of disease process will improve Outcome: Progressing Goal: Ability to verbalize understanding of medication therapies will improve Outcome: Progressing Goal: Individualized Educational Video(s) Outcome: Progressing   Problem: Activity: Goal: Capacity to carry out activities will improve Outcome:  Progressing   Problem: Cardiac: Goal: Ability to achieve and maintain adequate cardiopulmonary perfusion will improve Outcome: Progressing

## 2020-05-27 ENCOUNTER — Inpatient Hospital Stay: Payer: Self-pay

## 2020-05-27 ENCOUNTER — Encounter (HOSPITAL_COMMUNITY): Payer: Self-pay | Admitting: Surgery

## 2020-05-27 ENCOUNTER — Inpatient Hospital Stay (HOSPITAL_COMMUNITY): Payer: Medicare HMO

## 2020-05-27 DIAGNOSIS — Z951 Presence of aortocoronary bypass graft: Secondary | ICD-10-CM

## 2020-05-27 LAB — ECHO INTRAOPERATIVE TEE
AR max vel: 1.42 cm2
AV Area VTI: 1.55 cm2
AV Area mean vel: 1.4 cm2
AV Mean grad: 6.5 mmHg
AV Peak grad: 11.6 mmHg
Ao pk vel: 1.7 m/s
Weight: 2662.4 oz

## 2020-05-27 LAB — COOXEMETRY PANEL
Carboxyhemoglobin: 1.1 % (ref 0.5–1.5)
Methemoglobin: 1 % (ref 0.0–1.5)
O2 Saturation: 55.1 %
Total hemoglobin: 13.7 g/dL (ref 12.0–16.0)

## 2020-05-27 LAB — CBC
HCT: 27.1 % — ABNORMAL LOW (ref 39.0–52.0)
Hemoglobin: 8.9 g/dL — ABNORMAL LOW (ref 13.0–17.0)
MCH: 30.6 pg (ref 26.0–34.0)
MCHC: 32.8 g/dL (ref 30.0–36.0)
MCV: 93.1 fL (ref 80.0–100.0)
Platelets: 122 10*3/uL — ABNORMAL LOW (ref 150–400)
RBC: 2.91 MIL/uL — ABNORMAL LOW (ref 4.22–5.81)
RDW: 14.2 % (ref 11.5–15.5)
WBC: 9.2 10*3/uL (ref 4.0–10.5)
nRBC: 0 % (ref 0.0–0.2)

## 2020-05-27 LAB — BASIC METABOLIC PANEL
Anion gap: 11 (ref 5–15)
BUN: 26 mg/dL — ABNORMAL HIGH (ref 8–23)
CO2: 23 mmol/L (ref 22–32)
Calcium: 8.3 mg/dL — ABNORMAL LOW (ref 8.9–10.3)
Chloride: 93 mmol/L — ABNORMAL LOW (ref 98–111)
Creatinine, Ser: 1.39 mg/dL — ABNORMAL HIGH (ref 0.61–1.24)
GFR, Estimated: 48 mL/min — ABNORMAL LOW (ref >60)
Glucose, Bld: 150 mg/dL — ABNORMAL HIGH (ref 70–99)
Potassium: 4 mmol/L (ref 3.5–5.1)
Sodium: 127 mmol/L — ABNORMAL LOW (ref 135–145)

## 2020-05-27 LAB — GLUCOSE, CAPILLARY
Glucose-Capillary: 135 mg/dL — ABNORMAL HIGH (ref 70–99)
Glucose-Capillary: 130 mg/dL — ABNORMAL HIGH (ref 70–99)
Glucose-Capillary: 132 mg/dL — ABNORMAL HIGH (ref 70–99)
Glucose-Capillary: 152 mg/dL — ABNORMAL HIGH (ref 70–99)

## 2020-05-27 MED ORDER — FUROSEMIDE 10 MG/ML IJ SOLN
10.0000 mg/h | INTRAVENOUS | Status: DC
  Administered 2020-05-27 – 2020-05-30 (×4): 8 mg/h via INTRAVENOUS
  Administered 2020-05-31 – 2020-06-01 (×2): 10 mg/h via INTRAVENOUS
  Filled 2020-05-27 (×8): qty 20

## 2020-05-27 MED ORDER — MELATONIN 3 MG PO TABS
3.0000 mg | ORAL_TABLET | Freq: Every evening | ORAL | Status: DC | PRN
  Administered 2020-05-27 – 2020-06-12 (×8): 3 mg via ORAL
  Filled 2020-05-27 (×8): qty 1

## 2020-05-27 MED FILL — Potassium Chloride Inj 2 mEq/ML: INTRAVENOUS | Qty: 40 | Status: AC

## 2020-05-27 MED FILL — Lidocaine HCl Local Soln Prefilled Syringe 100 MG/5ML (2%): INTRAMUSCULAR | Qty: 10 | Status: AC

## 2020-05-27 MED FILL — Electrolyte-R (PH 7.4) Solution: INTRAVENOUS | Qty: 6000 | Status: AC

## 2020-05-27 MED FILL — Heparin Sodium (Porcine) Inj 1000 Unit/ML: INTRAMUSCULAR | Qty: 30 | Status: AC

## 2020-05-27 MED FILL — Mannitol IV Soln 20%: INTRAVENOUS | Qty: 500 | Status: AC

## 2020-05-27 MED FILL — Calcium Chloride Inj 10%: INTRAVENOUS | Qty: 10 | Status: AC

## 2020-05-27 MED FILL — Albumin, Human Inj 5%: INTRAVENOUS | Qty: 250 | Status: AC

## 2020-05-27 MED FILL — Heparin Sodium (Porcine) Inj 1000 Unit/ML: INTRAMUSCULAR | Qty: 20 | Status: AC

## 2020-05-27 MED FILL — Sodium Chloride IV Soln 0.9%: INTRAVENOUS | Qty: 2000 | Status: AC

## 2020-05-27 MED FILL — Magnesium Sulfate Inj 50%: INTRAMUSCULAR | Qty: 10 | Status: AC

## 2020-05-27 MED FILL — Sodium Bicarbonate IV Soln 8.4%: INTRAVENOUS | Qty: 50 | Status: AC

## 2020-05-27 NOTE — Consult Note (Addendum)
Advanced Heart Failure Team Consult Note   Primary Physician: [name], MD PCP-Cardiologist:  Peter Martinique, MD  CT Surgeon: Dr. Cyndia Bent   Reason for Consultation: Acute on Chronic Systolic Heart Failure, post CABG + AVR  HPI:    [name] [name] is seen today for evaluation of acute on chronic systolic heart failure, post CABG + AVR at the request of Dr. Cyndia Bent.  86 y/o male, very active despite age before recent illness. Had been working 20-30 hr/week for [employer] and [employer].   H/o combined systolic and diastolic HF w/ recent progressive decline in LVEF. Echo in 2014 showed normal LVEF 55-60% w/ G1DD. Echo 6/21 showed mildly reduced EF down to 40-45%. Echo repeated again 10/21 and EF had further dropped to 30-35% w/ global HK. RV systolic function mildly reduced. Also noted to have mod-sever AS. LVOT/AV VTI ratio: 0.25 and moderate MR. Had subsequent R/LHC showing severe 3V CAD. CI preserved at 2.74.   Underwent CABG x 4 (LIMA-LAD, sequential SVG-OM1 and OM2, SVG- PDA) + tissue AVR by Dr. Cyndia Bent on 11/26. Post operative course c/b atrial fibrillation treated w/ amiodarone + difficulty weaning inotropes, fluid overload and hypotension. Currently on dual inotropes/pressors. He is on NE 10 + Dopamine 3 mcg/kg/min. Milrinone was discontinued yesterday due to hypotension. A-line MAPs in low 60s. Also on midodrine 10 mg tid. Most recent Co-ox was drawn yesterday and was 65%. He remains ~30 lb above preop wt. SCr 1.39 today (basline <1.0). CXR shows small bilateral pleural effusions.     He is out of bed sitting up in chair. Feels "rough". O2 sats 95% on 2L/[place]. Temporary pacing wires in place. A-V pacing.    Echo 05/20/20 LVEF has decreased from 12/10/2019, now 30-35% with LV dyssynchrony. Aortic stenosis is most probably severe with low flow low gradient, dimensionless index 0.26. 2. Left ventricular ejection fraction, by estimation, is 30 to 35%. The left ventricle  has moderately decreased function. The left ventricle demonstrates global hypokinesis. The left ventricular internal cavity size was mildly dilated. There is severe asymmetric left ventricular hypertrophy of the basal-septal segment. Left ventricular diastolic function could not be evaluated. 3. Right ventricular systolic function is mildly reduced. The right ventricular size is mildly enlarged. There is moderately elevated pulmonary artery systolic pressure. The estimated right ventricular systolic pressure is 32.4 mmHg. 4. Left atrial size was severely dilated. 5. Right atrial size was moderately dilated. 6. The mitral valve is normal in structure. Moderate mitral valve regurgitation. No evidence of mitral stenosis. 7. The aortic valve is normal in structure. Aortic valve regurgitation is not visualized. Moderate to severe aortic valve stenosis. Aortic valve mean gradient measures 24.0 mmHg. 8. Aortic dilatation noted. There is mild to moderate dilatation of the ascending aorta, measuring 44 mm. 9. The inferior vena cava is dilated in size with <50% respiratory variability, suggesting right atrial pressure of 15 mmHg.  Review of Systems: [y] = yes, [ ]  = no   . General: Weight gain [Y ]; Weight loss [ ] ; Anorexia [ ] ; Fatigue [Y ]; Fever [ ] ; Chills [ ] ; Weakness [Y ]  . Cardiac: Chest pain/pressure [ ] ; Resting SOB [ ] ; Exertional SOB [ Y]; Orthopnea [ ] ; Pedal Edema [ Y]; Palpitations [ ] ; Syncope [ ] ; Presyncope [ ] ; Paroxysmal nocturnal dyspnea[ ]   . Pulmonary: Cough [ ] ; Wheezing[ ] ; Hemoptysis[ ] ; Sputum [ ] ; Snoring [ ]   . GI: Vomiting[ ] ; Dysphagia[ ] ; Melena[ ] ; Hematochezia [ ] ; Heartburn[ ] ;  Abdominal pain [ ] ; Constipation [ ] ; Diarrhea [ ] ; BRBPR [ ]   . GU: Hematuria[ ] ; Dysuria [ ] ; Nocturia[ ]   . Vascular: Pain in legs with walking [ ] ; Pain in feet with lying flat [ ] ; Non-healing sores [ ] ; Stroke [ ] ; TIA [ ] ; Slurred speech [ ] ;  . Neuro: Headaches[ ] ; Vertigo[ ] ;  Seizures[ ] ; Paresthesias[ ] ;Blurred vision [ ] ; Diplopia [ ] ; Vision changes [ ]   . Ortho/Skin: Arthritis [ ] ; Joint pain [ ] ; Muscle pain [ ] ; Joint swelling [ ] ; Back Pain [ ] ; Rash [ ]   . Psych: Depression[ ] ; Anxiety[ ]   . Heme: Bleeding problems [ ] ; Clotting disorders [ ] ; Anemia [ ]   . Endocrine: Diabetes [ ] ; Thyroid dysfunction[ ]   Home Medications Prior to Admission medications   Medication Sig Start Date End Date Taking? Authorizing Provider  aspirin 81 MG tablet Take 81 mg by mouth at bedtime.    Yes [provider]  dutasteride (AVODART) 0.5 MG capsule Take 0.5 mg by mouth daily. 01/28/20  Yes [provider]  furosemide (LASIX) 40 MG tablet Take 1 tablet (40 mg total) by mouth 2 (two) times daily. 04/22/20  Yes Martinique, Peter M, MD  losartan (COZAAR) 25 MG tablet Take 25 mg by mouth daily.   Yes [provider]  Multiple Vitamins-Minerals (CENTRUM SILVER PO) Take 1 tablet by mouth daily.    Yes [provider]  sulfaSALAzine (AZULFIDINE) 500 MG tablet Take 2,000 mg by mouth daily.    Yes [provider]  tamsulosin (FLOMAX) 0.4 MG CAPS Take 0.4 mg by mouth 2 (two) times daily.    Yes [provider]    Past Medical History: Past Medical History:  Diagnosis Date  . Aortic stenosis   . Arthritis   . CAD (coronary artery disease)   . Colitis   . Edema   . Essential hypertension    no med now  . Essential tremor   . Hyperlipidemia   . Hypotonic bladder    L4-L5  . Obstructive uropathy   . Peripheral neuropathy    Elevated by Dr. love 2010  . Prostate cancer ([facility]) 2005    Past Surgical History: Past Surgical History:  Procedure Laterality Date  . ACHILLES TENDON SURGERY Bilateral 1995  . COLONOSCOPY     With polyp resection  . REVERSE SHOULDER ARTHROPLASTY Right 06/04/2016  . REVERSE SHOULDER ARTHROPLASTY Right 06/04/2016   Procedure: REVERSE SHOULDER ARTHROPLASTY;  Surgeon: Netta Cedars, MD;  Location: [facility];   Service: Orthopedics;  Laterality: Right;  . RIGHT/LEFT HEART CATH AND CORONARY ANGIOGRAPHY N/A 05/20/2020   Procedure: RIGHT/LEFT HEART CATH AND CORONARY ANGIOGRAPHY;  Surgeon: Martinique, Peter M, MD;  Location: [facility];  Service: Cardiovascular;  Laterality: N/A;  . SKIN CANCER EXCISION  1974   On chest wall  . TOTAL KNEE ARTHROPLASTY Right 2011  . TOTAL KNEE ARTHROPLASTY Left 04/09/2013   Procedure: LEFT TOTAL KNEE ARTHROPLASTY;  Surgeon: Gearlean Alf, MD;  Location: [facility];  Service: Orthopedics;  Laterality: Left;    Family History: Family History  Problem Relation Age of Onset  . Hypertension Mother   . Heart failure Mother   . Diabetes Mother   . Heart attack Brother   . Heart attack Maternal Aunt     Social History: Social History   Socioeconomic History  . Marital status: Married    Spouse name: Not on file  . Number of children: Not  on file  . Years of education: Not on file  . Highest education level: Not on file  Occupational History  . Not on file  Tobacco Use  . Smoking status: Never Smoker  . Smokeless tobacco: Never Used  Vaping Use  . Vaping Use: Never used  Substance and Sexual Activity  . Alcohol use: Yes    Alcohol/week: 1.0 standard drink    Types: 1 Glasses of wine per week    Comment: occ  . Drug use: No  . Sexual activity: Yes  Other Topics Concern  . Not on file  Social History Narrative  . Not on file   Social Determinants of Health   Financial Resource Strain:   . Difficulty of Paying Living Expenses: Not on file  Food Insecurity:   . Worried About Charity fundraiser in the Last Year: Not on file  . Ran Out of Food in the Last Year: Not on file  Transportation Needs:   . Lack of Transportation (Medical): Not on file  . Lack of Transportation (Non-Medical): Not on file  Physical Activity:   . Days of Exercise per Week: Not on file  . Minutes of Exercise per Session: Not on file  Stress:   . Feeling of Stress : Not on  file  Social Connections:   . Frequency of Communication with Friends and Family: Not on file  . Frequency of Social Gatherings with Friends and Family: Not on file  . Attends Religious Services: Not on file  . Active Member of Clubs or Organizations: Not on file  . Attends Archivist Meetings: Not on file  . Marital Status: Not on file    Allergies:  Allergies  Allergen Reactions  . Simvastatin Other (See Comments)    "caused neuropathy pain"    Objective:    Vital Signs:   Temp:  [97.7 F (36.5 C)-99.1 F (37.3 C)] 99.1 F (37.3 C) (11/30 0755) Pulse Rate:  [84-106] 94 (11/30 0700) Resp:  [15-39] 21 (11/30 0700) BP: (83-120)/(53-77) 101/74 (11/30 0700) SpO2:  [83 %-98 %] 90 % (11/30 0700) Arterial Line BP: (71-156)/(43-75) 100/49 (11/30 0700) Weight:  [89.5 kg] 89.5 kg (11/30 0500) Last BM Date:  (PTA)  Weight change: Filed Weights   05/25/20 0600 05/26/20 0600 05/27/20 0500  Weight: 85.3 kg 88.1 kg 89.5 kg    Intake/Output:   Intake/Output Summary (Last 24 hours) at 05/27/2020 3382 Last data filed at 05/27/2020 0700 Gross per 24 hour  Intake 1344.68 ml  Output 1905 ml  Net -560.32 ml      Physical Exam    General:  Fatigue appearing elderly male, sitting up in chair. No resp difficulty HEENT: normal Neck: supple. JVP elevated to jaw . Rt IJ CVC. Carotids 2+ bilat; no bruits. No lymphadenopathy or thyromegaly appreciated. Cor: PMI nondisplaced. Regular rate & rhythm. No rubs, gallops or murmurs. sternotomy site bandaged + pacing wires  Lungs: decreased BS at the bases bilaterally  Abdomen: soft, nontender, nondistended. No hepatosplenomegaly. No bruits or masses. Good bowel sounds. Extremities: no cyanosis, clubbing, rash, 1+ bilateral LE edema Neuro: alert & orientedx3, cranial nerves grossly intact. moves all 4 extremities w/o difficulty. Affect pleasant   Telemetry   A-V pacing   EKG    No new EKG to review today, most recent 11/29  showed Afib 86 bpm. Non-specific intra-ventricular conduction block. QRS duration 156 ms.   Labs   Basic Metabolic Panel: Recent Labs  Lab 05/23/20 2310 05/24/20 0119  05/24/20 0415 05/24/20 0415 05/24/20 1546 05/24/20 1546 05/25/20 0318 05/26/20 0424 05/26/20 1750 05/27/20 0328  NA 138   < > 139   < > 133*  --  131* 128* 127* 127*  K 4.3   < > 4.5   < > 4.2  --  3.9 4.0 3.8 4.0  CL 107   < > 105  --  101  --  100 97*  --  93*  CO2 21*   < > 22  --  19*  --  22 23  --  23  GLUCOSE 177*   < > 158*  --  211*  --  140* 121*  --  150*  BUN 19   < > 19  --  20  --  18 24*  --  26*  CREATININE 1.14   < > 1.11  --  1.16  --  1.16 1.32*  --  1.39*  CALCIUM 8.1*   < > 8.2*   < > 8.1*   < > 8.1* 8.1*  --  8.3*  MG 2.7*  --  2.6*  --  2.4  --   --  2.0  --   --    < > = values in this interval not displayed.    Liver Function Tests: Recent Labs  Lab 05/24/20 1546  AST 49*  ALT 19  ALKPHOS 34*  BILITOT 1.3*  PROT 5.5*  ALBUMIN 3.4*   No results for input(s): LIPASE, AMYLASE in the last 168 hours. No results for input(s): AMMONIA in the last 168 hours.  CBC: Recent Labs  Lab 05/23/20 0004 05/23/20 0809 05/24/20 0415 05/24/20 0415 05/24/20 1546 05/25/20 0318 05/26/20 0424 05/26/20 1750 05/27/20 0328  WBC 6.3   < > 9.3  --  12.0* 10.5 8.9  --  9.2  NEUTROABS 4.5  --   --   --   --   --   --   --   --   HGB 13.2   < > 8.1*   < > 8.9* 8.1* 8.2* 8.8* 8.9*  HCT 39.9   < > 24.4*   < > 26.0* 24.3* 24.4* 26.0* 27.1*  MCV 94.3   < > 93.5  --  90.9 91.4 93.5  --  93.1  PLT 155   < > 98*  --  92* 79* 87*  --  122*   < > = values in this interval not displayed.    Cardiac Enzymes: No results for input(s): CKTOTAL, CKMB, CKMBINDEX, TROPONINI in the last 168 hours.  BNP: BNP (last 3 results) Recent Labs    11/19/19 1550 05/16/20 1736  BNP 1,139.7* 2,005.2*    ProBNP (last 3 results) Recent Labs    01/02/20 1134  PROBNP 1,167.0*     CBG: Recent Labs  Lab  05/26/20 0918 05/26/20 1205 05/26/20 1523 05/26/20 1939 05/27/20 0621  GLUCAP 173* 179* 144* 152* 135*    Coagulation Studies: No results for input(s): LABPROT, INR in the last 72 hours.   Imaging   DG CHEST PORT 1 VIEW  Result Date: 05/27/2020 CLINICAL DATA:  Sore chest.  CABG. EXAM: PORTABLE CHEST 1 VIEW COMPARISON:  05/26/2020. FINDINGS: Right IJ sheath in stable position. Prior CABG and cardiac valve replacement. Cardiomegaly again noted. No pulmonary venous congestion. Interim partial clearing of bibasilar edema. Persistent bibasilar atelectasis and small bilateral pleural effusions. No pneumothorax. Prior right shoulder replacement. Degenerative changes left shoulder. IMPRESSION: 1. Right IJ sheath in stable position.  2. Prior CABG and cardiac valve replacement. Cardiomegaly again noted. No pulmonary venous congestion. 3. Interim partial clearing of bibasilar edema. Persistent bibasilar atelectasis and small bilateral pleural effusions. Electronically Signed   By: Marcello Moores  Register   On: 05/27/2020 06:23      Medications:     Current Medications: . acetaminophen  1,000 mg Oral Q6H   Or  . acetaminophen (TYLENOL) oral liquid 160 mg/5 mL  1,000 mg Per Tube Q6H  . amiodarone  400 mg Oral BID  . aspirin EC  81 mg Oral Daily  . bisacodyl  10 mg Oral Daily   Or  . bisacodyl  10 mg Rectal Daily  . Chlorhexidine Gluconate Cloth  6 each Topical Daily  . docusate sodium  200 mg Oral Daily  . ferrous [id number] C-folic acid  1 capsule Oral BID PC  . insulin aspart  0-15 Units Subcutaneous TID WC  . mouth rinse  15 mL Mouth Rinse BID  . metoCLOPramide (REGLAN) injection  10 mg Intravenous Q6H  . midodrine  10 mg Oral TID  . pantoprazole  40 mg Oral Daily  . potassium chloride  20 mEq Oral BID     Infusions: . DOPamine 3 mcg/kg/min (05/27/20 0700)  . lactated ringers 10 mL/hr at 05/27/20 0700  . norepinephrine (LEVOPHED) Adult infusion 10 mcg/min (05/27/20 0700)       Assessment/Plan   1. Severe 3V CAD: - s/p CABGx 4 (LIMA-LAD, sequential SVG-OM1 and OM2, SVG- PDA) - continue ASA 81 - no  blocker w/ post-cardiotomy shock and low BP - consider addition of statin   2. Aortic Stenosis: - severe low flow low gradient AS - s/p tissue AVR 11/26   3. Acute on Chronic Biventricular Heart Failure:  - Echo in 2014 showed normal LVEF 55-60% w/ G1DD.  - Echo 6/21 showed mildly reduced EF down to 40-45%.  - Echo repeated again 10/21 and EF had further dropped to 30-35% w/ global HK. RV systolic function mildly reduced. - LHC this admit w/ severe 3V CAD>>ICM. Now s/p CABG. Also w/ severe AS, possibly contributing to LV dysfunction, now s/p SAVR - remains fluid overloaded. Per charted wt's, he is ~30 above preop wt. ? If accurate. Will order CVPs to help better assess volume status and guide diuresis  - difficulty weaning pressors. On NE 10 + Dopamine 3 + midodrine 10 mg tid. Did not tolerate milrinone due to hypotension (discontinued 11/29) - check Co-ox today  - given concomitant aortic stenosis, h/o recurrent pleural effusions, hypotension and peripheral neuropathy, ? Amyloidosis. Consider cMRI - Add TEE hoses  - hypotension limits use of ARNi/ARB and spiro - consider addition of digoxin    4. Post Operative Atrial Fibrillation:   - continues w/ temp pacing wires, currently A-V paced - continue PO amiodarone 400 mg bid  - defer a/c to CT surgery    5. AKI:   - Scr 1.3 today (baseline <1.0)  - continue inotropes/ pressors and keep SBP >100  - diuresis per above  6. Hyponatremia - Na 127 - hypervolemic hyponatremia - diureses w/ IV Lasix   7. Anemia - expected ABLA from surgery  - Hgb stable at 8.9 - on PO Fe     Length of Stay: [address], PA-C  05/27/2020, 9:04 AM  Advanced Heart Failure Team Pager [phone] (M-F; [place])  Please contact [facility] for night-coverage after hours (4p -7a ) and weekends on  [email or web address]  Agree with above.  Very functional 86 y/o male admitted with severe AS with worsening EF 30-35% .Found to have 3v CAD on cath. Now s/p AVR and CABG on 11/26. He has been persistently inotrope/pressor dependent and we are asked to assist in management.   Remains on DA and NE as well as midodrine. Milrinone stopped due to low BP. Co-ox in 55-60% range. CVP 10. Weight still up about 20 pounds.   General:  Elderly male  No resp difficulty HEENT: normal Neck: supple. RIJ introducer. Carotids 2+ bilat; no bruits. No lymphadenopathy or thryomegaly appreciated. Cor: PMI nondisplaced. Regular rate & rhythm. No rubs, gallops or murmurs. Sternal dressing ok  Lungs: decrease at bases Abdomen: soft, nontender, nondistended. No hepatosplenomegaly. No bruits or masses. Good bowel sounds. Extremities: no cyanosis, clubbing, rash, 2+ edema Neuro: alert & orientedx3, cranial nerves grossly intact. moves all 4 extremities w/o difficulty. Affect pleasant  He remains inotrope and pressor dependent 4 days post CABG/AVR. Also still markedly volume overloaded. First issue will be to remove fluid. Place UNNA boots. Start IV lasix gtt. Continue pressors  Will check repeat echo. If/when fluid off will wean pressors slowly. Suspect it will be very slow gowing. Continue to mobilize.   CRITICAL CARE Performed by: Glori Bickers  Total critical care time: 35 minutes  Critical care time was exclusive of separately billable procedures and treating other patients.  Critical care was necessary to treat or prevent imminent or life-threatening deterioration.  Critical care was time spent personally by me (independent of midlevel providers or residents) on the following activities: development of treatment plan with patient and/or surrogate as well as nursing, discussions with consultants, evaluation of patient's response to treatment, examination of patient, obtaining history from patient or surrogate, ordering  and performing treatments and interventions, ordering and review of laboratory studies, ordering and review of radiographic studies, pulse oximetry and re-evaluation of patient's condition.  Glori Bickers, MD  4:51 PM

## 2020-05-27 NOTE — Progress Notes (Addendum)
[facility] DAILY ICU PROGRESS NOTE                   [address]            ,[place] [address]          [phone]   4 Days Post-Op Procedure(s) (LRB): AORTIC VALVE REPLACEMENT (AVR) USING EDWARDS RESILIA 29 MM AORTIC VALVE. (N/A) CORONARY ARTERY BYPASS GRAFTING (CABG) USING LIMA to LAD; ENDOSCOPIC HARVESTED RIGHT GREATER SAPHENOUS VEIN: SVG to PD; SVG to SEQUENCED INTERMEDIATE to OM (N/A) TRANSESOPHAGEAL ECHOCARDIOGRAM (TEE) (N/A) ENDOVEIN HARVEST OF GREATER SAPHENOUS VEIN (Right)  Total Length of Stay:  LOS: 11 days   Subjective: Feels ok but a bit frustrated with progress  Objective: Vital signs in last 24 hours: Temp:  [97.7 F (36.5 C)-99.1 F (37.3 C)] 97.7 F (36.5 C) (11/30 1123) Pulse Rate:  [84-106] 94 (11/30 1200) Cardiac Rhythm: A-V Sequential paced (11/30 0800) Resp:  [15-39] 19 (11/30 1100) BP: (82-117)/(53-77) 111/67 (11/30 1100) SpO2:  [90 %-98 %] 95 % (11/30 1200) Arterial Line BP: (71-156)/(43-87) 92/87 (11/30 1200) Weight:  [89.5 kg] 89.5 kg (11/30 0500)  Filed Weights   05/25/20 0600 05/26/20 0600 05/27/20 0500  Weight: 85.3 kg 88.1 kg 89.5 kg    Weight change: 1.4 kg   Hemodynamic parameters for last 24 hours:    Intake/Output from previous day: 11/29 0701 - 11/30 0700 In: 1519 [I.V.:1399] Out: 2030 [Urine:2030]  Intake/Output this shift: Total I/O In: 68.3 [I.V.:68.3] Out: 405 [Urine:405]  Current Meds: Scheduled Meds: . acetaminophen  1,000 mg Oral Q6H   Or  . acetaminophen (TYLENOL) oral liquid 160 mg/5 mL  1,000 mg Per Tube Q6H  . amiodarone  400 mg Oral BID  . aspirin EC  81 mg Oral Daily  . bisacodyl  10 mg Oral Daily   Or  . bisacodyl  10 mg Rectal Daily  . Chlorhexidine Gluconate Cloth  6 each Topical Daily  . docusate sodium  200 mg Oral Daily  . ferrous [id number] C-folic acid  1 capsule Oral BID PC  . insulin aspart  0-15 Units Subcutaneous TID WC  . mouth rinse  15 mL Mouth Rinse BID  .  midodrine  10 mg Oral TID  . pantoprazole  40 mg Oral Daily  . potassium chloride  20 mEq Oral BID   Continuous Infusions: . DOPamine 3 mcg/kg/min (05/27/20 1100)  . furosemide (LASIX) 200 mg in dextrose 5% 100 mL (2mg /mL) infusion    . lactated ringers Stopped (05/27/20 0813)  . norepinephrine (LEVOPHED) Adult infusion 10 mcg/min (05/27/20 1100)   PRN Meds:.ondansetron (ZOFRAN) IV, oxyCODONE, traMADol  General appearance: alert, cooperative and no distress Heart: regular rate and rhythm Lungs: dim in lower fields Abdomen: soft, non-tender Extremities: minor ankle edema Wound: evh site ok, chest dressing in place  Lab Results: CBC: Recent Labs    05/26/20 0424 05/26/20 0424 05/26/20 1750 05/27/20 0328  WBC 8.9  --   --  9.2  HGB 8.2*   < > 8.8* 8.9*  HCT 24.4*   < > 26.0* 27.1*  PLT 87*  --   --  122*   < > = values in this interval not displayed.   BMET:  Recent Labs    05/26/20 0424 05/26/20 0424 05/26/20 1750 05/27/20 0328  NA 128*   < > 127* 127*  K 4.0   < > 3.8 4.0  CL 97*  --   --  93*  CO2 23  --   --  23  GLUCOSE 121*  --   --  150*  BUN 24*  --   --  26*  CREATININE 1.32*  --   --  1.39*  CALCIUM 8.1*  --   --  8.3*   < > = values in this interval not displayed.    CMET: Lab Results  Component Value Date   WBC 9.2 05/27/2020   HGB 8.9 (L) 05/27/2020   HCT 27.1 (L) 05/27/2020   PLT 122 (L) 05/27/2020   GLUCOSE 150 (H) 05/27/2020   ALT 19 05/24/2020   AST 49 (H) 05/24/2020   NA 127 (L) 05/27/2020   K 4.0 05/27/2020   CL 93 (L) 05/27/2020   CREATININE 1.39 (H) 05/27/2020   BUN 26 (H) 05/27/2020   CO2 23 05/27/2020   TSH 0.94 10/02/2012   INR 1.3 (H) 05/23/2020   HGBA1C 6.2 (H) 05/16/2020      PT/INR: No results for input(s): LABPROT, INR in the last 72 hours. Radiology: DG CHEST PORT 1 VIEW  Result Date: 05/27/2020 CLINICAL DATA:  Sore chest.  CABG. EXAM: PORTABLE CHEST 1 VIEW COMPARISON:  05/26/2020. FINDINGS: Right IJ sheath in  stable position. Prior CABG and cardiac valve replacement. Cardiomegaly again noted. No pulmonary venous congestion. Interim partial clearing of bibasilar edema. Persistent bibasilar atelectasis and small bilateral pleural effusions. No pneumothorax. Prior right shoulder replacement. Degenerative changes left shoulder. IMPRESSION: 1. Right IJ sheath in stable position. 2. Prior CABG and cardiac valve replacement. Cardiomegaly again noted. No pulmonary venous congestion. 3. Interim partial clearing of bibasilar edema. Persistent bibasilar atelectasis and small bilateral pleural effusions. Electronically Signed   By: Marcello Moores  Register   On: 05/27/2020 06:23     Assessment/Plan: S/P Procedure(s) (LRB): AORTIC VALVE REPLACEMENT (AVR) USING EDWARDS RESILIA 29 MM AORTIC VALVE. (N/A) CORONARY ARTERY BYPASS GRAFTING (CABG) USING LIMA to LAD; ENDOSCOPIC HARVESTED RIGHT GREATER SAPHENOUS VEIN: SVG to PD; SVG to SEQUENCED INTERMEDIATE to OM (N/A) TRANSESOPHAGEAL ECHOCARDIOGRAM (TEE) (N/A) ENDOVEIN HARVEST OF GREATER SAPHENOUS VEIN (Right)   1 afebrile, afib, now- AV paced- on amio po- will need to consider ACRX if recurs, dopamine, levo and lasix gtts.  BP 90-low 100's- AHF team assisting now with management. Co_Ox is 55 2 sats ok on 2 liters Burt 3 expected ABLA, trend improved, equalibrating  4 creat is pretty stable at 1.39, making urine with lasix gtt currently at 8mg  /hr, renal dose dop. hyponatremia is fairly stable, hypochloremia trend is lower. Cont to monitor closely on lasix gtt 5 thrombocytopenia trend improving 6 CXR - bilat atx/small effus- improving aeration overall 7 BS adeq control 8 has been seen by PT/OT- push as able 9 pulm toilet/IS    [name] Giovanni PA-C Pager [phone] 05/27/2020 12:18 PM    Chart reviewed, patient examined, agree with above. He remains hemodynamically stable but still dependent on NE and midodrine to support BP. I added dop 3 last pm. He had been on milrinone  0.125 that was stopped yesterday morning since Co-ox was 65.2 and I was concerned about where this was contributing to hypotension. Co-ox this am 55%. Preop EF 30-35%. He did not diurese much with torsemide 40 mg yesterday afternoon and he is very volume overloaded. I asked heart failure team to see him and lasix drip was started. I can't tell for sure if rhythm is atrial fib but I think it is. We are AV pacing 90 to help BP and CO since  rhythm is slower and irregular on his own. I would hold off on anticoagulation for now in this 86 year old who was very coagulopathic at the time of surgery.

## 2020-05-27 NOTE — NC FL2 (Signed)
[place] MEDICAID FL2 LEVEL OF CARE SCREENING TOOL     IDENTIFICATION  Patient Name: [name] [name] Birthdate: 04-15-1928 Sex: male Admission Date (Current Location): 05/16/2020  [facility] and [place] Number:  Herbalist and Address:  [facility], [place] [address], [place], [place] [address]      Provider Number: [id number]  Attending Physician Name and Address:  Gaye Pollack, MD  Relative Name and Phone Number:  Tomi Bamberger [phone]    Current Level of Care: Hospital Recommended Level of Care: [place] Prior Approval Number:    Date Approved/Denied:   PASRR Number: [id number]  Discharge Plan: SNF    Current Diagnoses: Patient Active Problem List   Diagnosis Date Noted  . S/P CABG x 4 05/23/2020  . Pressure injury of skin 05/20/2020  . Acute on chronic combined systolic and diastolic CHF (congestive heart failure) ([place]) 05/16/2020  . Severe aortic stenosis 05/16/2020  . Chronic kidney disease (CKD), stage III (moderate) ([place]) 05/16/2020  . Pleural effusion on right 01/02/2020  . S/P shoulder replacement, right 06/04/2016  . Venous insufficiency 05/15/2015  . BPH (benign prostatic hyperplasia) 08/15/2014  . CAP (community acquired pneumonia) 08/14/2014  . Colitis 08/14/2014  . Pneumonia 08/13/2014  . OA (osteoarthritis) of knee 04/09/2013  . Edema   . Dyspnea 09/27/2012  . Bilateral leg edema 09/27/2012  . HTN (hypertension) 09/27/2012  . Hyperlipidemia     Orientation RESPIRATION BLADDER Height & Weight     Self, Time, Situation, Place  O2 (Nasal Cannula 2 liters) Continent (Urethral Catheter M. Pelance RNFA temperature probe Latex 16 Fr.) Weight: 197 lb 5 oz (89.5 kg) Height:  5\' 9"  (175.3 cm)  BEHAVIORAL SYMPTOMS/MOOD NEUROLOGICAL BOWEL NUTRITION STATUS      Incontinent Diet (See Discharge Summary)  AMBULATORY STATUS COMMUNICATION OF NEEDS Skin   Limited Assist Verbally Surgical wounds, Other (Comment) (PI  sacrum mid,medial stage 2, Foam lift dressing to assess site every shift, changed every 3 days,clean,dry,erythema blanchable,Incision closed leg right gauze clean,dry,intact, attached edges,skin glue)                       Personal Care Assistance Level of Assistance  Bathing, Feeding, Dressing Bathing Assistance: Limited assistance Feeding assistance: Independent (able to feed self, Cardiac;Carb modified) Dressing Assistance: Limited assistance     Functional Limitations Info  Sight, Hearing, Speech Sight Info: Impaired Hearing Info: Impaired Speech Info: Adequate    SPECIAL CARE FACTORS FREQUENCY  PT (By licensed PT), OT (By licensed OT)     PT Frequency: 5x min weekly OT Frequency: 5x min weekly            Contractures Contractures Info: Not present    Additional Factors Info  Code Status, Allergies, Insulin Sliding Scale Code Status Info: FULL Allergies Info: Simvastatin   Insulin Sliding Scale Info: insulin aspart (novoLOG) injection 0-15 Units 3 times daily with meals       Current Medications (05/27/2020):  This is the current hospital active medication list Current Facility-Administered Medications  Medication Dose Route Frequency Provider Last Rate Last Admin  . acetaminophen (TYLENOL) tablet 1,000 mg  1,000 mg Oral Q6H Gold, Wayne E, PA-C   1,000 mg at 05/27/20 1116   Or  . acetaminophen (TYLENOL) 160 MG/5ML solution 1,000 mg  1,000 mg Per Tube Q6H Gold, Wayne E, PA-C   1,000 mg at 05/25/20 1829  . amiodarone (PACERONE) tablet 400 mg  400 mg Oral BID Atkins,  Glenice Bow, MD   400 mg at 05/27/20 1115  . aspirin EC tablet 81 mg  81 mg Oral Daily Wonda Olds, MD   81 mg at 05/27/20 1116  . bisacodyl (DULCOLAX) EC tablet 10 mg  10 mg Oral Daily Gold, Wayne E, PA-C   10 mg at 05/26/20 1139   Or  . bisacodyl (DULCOLAX) suppository 10 mg  10 mg Rectal Daily Gold, Wayne E, PA-C      . Chlorhexidine Gluconate Cloth 2 % PADS 6 each  6 each Topical Daily  Gaye Pollack, MD   6 each at 05/26/20 1000  . docusate sodium (COLACE) capsule 200 mg  200 mg Oral Daily Gold, Wayne E, PA-C   200 mg at 05/26/20 1139  . DOPamine (INTROPIN) 800 mg in dextrose 5 % 250 mL (3.2 mg/mL) infusion  3 mcg/kg/min Intravenous Titrated Gaye Pollack, MD 4.96 mL/hr at 05/27/20 1100 3 mcg/kg/min at 05/27/20 1100  . ferrous [id number] C-folic acid (TRINSICON / FOLTRIN) capsule 1 capsule  1 capsule Oral BID PC Gaye Pollack, MD   1 capsule at 05/27/20 0742  . furosemide (LASIX) 200 mg in dextrose 5 % 100 mL (2 mg/mL) infusion  8 mg/hr Intravenous Continuous Bensimhon, Daniel R, MD      . insulin aspart (novoLOG) injection 0-15 Units  0-15 Units Subcutaneous TID WC Gaye Pollack, MD   2 Units at 05/27/20 1135  . lactated ringers infusion   Intravenous Continuous John Giovanni, PA-C   Paused at 05/27/20 0813  . MEDLINE mouth rinse  15 mL Mouth Rinse BID Gaye Pollack, MD   15 mL at 05/26/20 0836  . midodrine (PROAMATINE) tablet 10 mg  10 mg Oral TID Gaye Pollack, MD   10 mg at 05/27/20 1116  . norepinephrine (LEVOPHED) 16 mg in 27mL premix infusion  0-40 mcg/min Intravenous Titrated Gaye Pollack, MD 9.38 mL/hr at 05/27/20 1100 10 mcg/min at 05/27/20 1100  . ondansetron (ZOFRAN) injection 4 mg  4 mg Intravenous Q6H PRN Gold, Wayne E, PA-C   4 mg at 05/26/20 1606  . oxyCODONE (Oxy IR/ROXICODONE) immediate release tablet 5-10 mg  5-10 mg Oral Q3H PRN Gold, Wayne E, PA-C      . pantoprazole (PROTONIX) EC tablet 40 mg  40 mg Oral Daily Gold, Wayne E, PA-C   40 mg at 05/27/20 1116  . potassium chloride SA (KLOR-CON) CR tablet 20 mEq  20 mEq Oral BID Gaye Pollack, MD   20 mEq at 05/27/20 1116  . traMADol (ULTRAM) tablet 50-100 mg  50-100 mg Oral Q4H PRN Gold, Wayne E, PA-C   50 mg at 05/26/20 1140     Discharge Medications: Please see discharge summary for a list of discharge medications.  Relevant Imaging Results:  Relevant Lab  Results:   Additional Information [id number]  Trula Ore, LCSWA

## 2020-05-27 NOTE — Progress Notes (Signed)
Patient ID: [name] [name], male   DOB: Aug 10, 1927, 86 y.o.   MRN: [id number] TCTS Evening Rounds:  Hemodynamically stable on NE 10, dop 3.  CVP 10 AVR paced 90  Lasix drip started 1 hr ago.   Has been up in chair all day and now back to bed.

## 2020-05-27 NOTE — TOC Initial Note (Signed)
Transition of Care [facility]) - Initial/Assessment Note    Patient Details  Name: Bolton Canupp MRN: [id number] Date of Birth: Nov 06, 1927  [facility] of [facility]) CM/SW Contact:    Trula Ore, [place] Phone Number: 05/27/2020, 1:33 PM  Clinical Narrative:                  CSW received consult for possible SNF placement at time of discharge. CSW spoke with patient regarding PT recommendation of SNF placement at time of discharge.  Patient expressed understanding of PT recommendation and is agreeable to SNF placement at time of discharge. Patient gave CSW permission to fax out initial referral near the [place] area. Patient gave CSW permission to discuss his care with his spouse Tomi Bamberger. Patient has received the COVID vaccines as well as his booster vaccine.  No further questions reported at this time. CSW to continue to follow and assist with discharge planning needs.  Expected Discharge Plan: Skilled Nursing Facility Barriers to Discharge: Continued Medical Work up   Patient Goals and CMS Choice Patient states their goals for this hospitalization and ongoing recovery are:: to go to SNF CMS Medicare.gov Compare Post Acute Care list provided to:: Patient Choice offered to / list presented to : Patient  Expected Discharge Plan and Services Expected Discharge Plan: [place]       Living arrangements for the past 2 months: Single Family Home                                      Prior Living Arrangements/Services Living arrangements for the past 2 months: Single Family Home Lives with:: Self, Spouse Patient language and need for interpreter reviewed:: Yes Do you feel safe going back to the place where you live?: No   SNF  Need for Family Participation in Patient Care: Yes (Comment) Care giver support system in place?: Yes (comment)   Criminal Activity/Legal Involvement Pertinent to Current Situation/Hospitalization: No - Comment as needed  Activities of  Daily Living Home Assistive Devices/Equipment: None ADL Screening (condition at time of admission) Patient's cognitive ability adequate to safely complete daily activities?: Yes Is the patient deaf or have difficulty hearing?: Yes Does the patient have difficulty seeing, even when wearing glasses/contacts?: No Does the patient have difficulty concentrating, remembering, or making decisions?: No Patient able to express need for assistance with ADLs?: Yes Does the patient have difficulty dressing or bathing?: No Independently performs ADLs?: Yes (appropriate for developmental age) Does the patient have difficulty walking or climbing stairs?: Yes Weakness of Legs: Both Weakness of Arms/Hands: None  Permission Sought/Granted Permission sought to share information with : Case Manager, Family Supports, Chartered certified accountant granted to share information with : Yes, Verbal Permission Granted  Share Information with NAME: Tomi Bamberger  Permission granted to share info w AGENCY: SNF  Permission granted to share info w Relationship: spouse  Permission granted to share info w Contact Information: Tomi Bamberger [phone]  Emotional Assessment Appearance:: Appears stated age Attitude/Demeanor/Rapport: Gracious Affect (typically observed): Calm Orientation: : Oriented to Self, Oriented to Place, Oriented to  Time, Oriented to Situation Alcohol / Substance Use: Not Applicable Psych Involvement: No (comment)  Admission diagnosis:  Acute on chronic combined systolic and diastolic CHF (congestive heart failure) ([place]) [I50.43] S/P CABG x 4 [Z95.1] Patient Active Problem List   Diagnosis Date Noted  . S/P CABG x 4 05/23/2020  . Pressure injury of skin  05/20/2020  . Acute on chronic combined systolic and diastolic CHF (congestive heart failure) ([place]) 05/16/2020  . Severe aortic stenosis 05/16/2020  . Chronic kidney disease (CKD), stage III (moderate) ([place]) 05/16/2020  . Pleural effusion  on right 01/02/2020  . S/P shoulder replacement, right 06/04/2016  . Venous insufficiency 05/15/2015  . BPH (benign prostatic hyperplasia) 08/15/2014  . CAP (community acquired pneumonia) 08/14/2014  . Colitis 08/14/2014  . Pneumonia 08/13/2014  . OA (osteoarthritis) of knee 04/09/2013  . Edema   . Dyspnea 09/27/2012  . Bilateral leg edema 09/27/2012  . HTN (hypertension) 09/27/2012  . Hyperlipidemia    PCP:  [name], MD Pharmacy:   [place], [place] - [address] [place] [address] Phone: [phone] Fax: [phone]     Social Determinants of Health (SDOH) Interventions    Readmission Risk Interventions No flowsheet data found.

## 2020-05-28 ENCOUNTER — Inpatient Hospital Stay (HOSPITAL_COMMUNITY): Payer: Medicare HMO

## 2020-05-28 DIAGNOSIS — I5021 Acute systolic (congestive) heart failure: Secondary | ICD-10-CM | POA: Diagnosis not present

## 2020-05-28 DIAGNOSIS — I5043 Acute on chronic combined systolic (congestive) and diastolic (congestive) heart failure: Secondary | ICD-10-CM | POA: Diagnosis not present

## 2020-05-28 DIAGNOSIS — Z951 Presence of aortocoronary bypass graft: Secondary | ICD-10-CM | POA: Diagnosis not present

## 2020-05-28 LAB — CULTURE, BLOOD (ROUTINE X 2)
Culture: NO GROWTH
Culture: NO GROWTH
Special Requests: ADEQUATE
Special Requests: ADEQUATE

## 2020-05-28 LAB — CBC
HCT: 26.7 % — ABNORMAL LOW (ref 39.0–52.0)
Hemoglobin: 9.1 g/dL — ABNORMAL LOW (ref 13.0–17.0)
MCH: 31.3 pg (ref 26.0–34.0)
MCHC: 34.1 g/dL (ref 30.0–36.0)
MCV: 91.8 fL (ref 80.0–100.0)
Platelets: 159 10*3/uL (ref 150–400)
RBC: 2.91 MIL/uL — ABNORMAL LOW (ref 4.22–5.81)
RDW: 14.1 % (ref 11.5–15.5)
WBC: 7.3 10*3/uL (ref 4.0–10.5)
nRBC: 0.3 % — ABNORMAL HIGH (ref 0.0–0.2)

## 2020-05-28 LAB — BASIC METABOLIC PANEL
Anion gap: 12 (ref 5–15)
BUN: 27 mg/dL — ABNORMAL HIGH (ref 8–23)
CO2: 26 mmol/L (ref 22–32)
Calcium: 8.8 mg/dL — ABNORMAL LOW (ref 8.9–10.3)
Chloride: 93 mmol/L — ABNORMAL LOW (ref 98–111)
Creatinine, Ser: 1.44 mg/dL — ABNORMAL HIGH (ref 0.61–1.24)
GFR, Estimated: 46 mL/min — ABNORMAL LOW (ref >60)
Glucose, Bld: 142 mg/dL — ABNORMAL HIGH (ref 70–99)
Potassium: 3.5 mmol/L (ref 3.5–5.1)
Sodium: 131 mmol/L — ABNORMAL LOW (ref 135–145)

## 2020-05-28 LAB — COOXEMETRY PANEL
Carboxyhemoglobin: 1.2 % (ref 0.5–1.5)
Methemoglobin: 0.8 % (ref 0.0–1.5)
O2 Saturation: 54.4 %
Total hemoglobin: 9.6 g/dL — ABNORMAL LOW (ref 12.0–16.0)

## 2020-05-28 LAB — ECHOCARDIOGRAM COMPLETE
AR max vel: 2.86 cm2
AV Area VTI: 2.59 cm2
AV Area mean vel: 2.72 cm2
AV Mean grad: 4.3 mmHg
AV Peak grad: 6.7 mmHg
Ao pk vel: 1.29 m/s
Calc EF: 26.6 %
Height: 69 in
MV M vel: 4.49 m/s
MV Peak grad: 80.6 mmHg
Radius: 0.6 cm
S' Lateral: 4.12 cm
Single Plane A2C EF: 24 %
Single Plane A4C EF: 27.4 %
Weight: 3086.44 oz

## 2020-05-28 LAB — GLUCOSE, CAPILLARY
Glucose-Capillary: 131 mg/dL — ABNORMAL HIGH (ref 70–99)
Glucose-Capillary: 126 mg/dL — ABNORMAL HIGH (ref 70–99)
Glucose-Capillary: 174 mg/dL — ABNORMAL HIGH (ref 70–99)
Glucose-Capillary: 100 mg/dL — ABNORMAL HIGH (ref 70–99)

## 2020-05-28 LAB — MAGNESIUM: Magnesium: 1.9 mg/dL (ref 1.7–2.4)

## 2020-05-28 MED ORDER — TRAMADOL HCL 50 MG PO TABS
50.0000 mg | ORAL_TABLET | ORAL | Status: DC | PRN
  Administered 2020-05-29 – 2020-06-03 (×6): 50 mg via ORAL
  Filled 2020-05-28 (×6): qty 1

## 2020-05-28 MED ORDER — ENSURE ENLIVE PO LIQD
237.0000 mL | Freq: Two times a day (BID) | ORAL | Status: DC
  Administered 2020-05-28 – 2020-06-11 (×25): 237 mL via ORAL

## 2020-05-28 MED ORDER — SODIUM CHLORIDE 0.9% FLUSH
10.0000 mL | Freq: Two times a day (BID) | INTRAVENOUS | Status: DC
  Administered 2020-05-28 – 2020-05-31 (×7): 10 mL

## 2020-05-28 MED ORDER — ADULT MULTIVITAMIN W/MINERALS CH
1.0000 | ORAL_TABLET | Freq: Every day | ORAL | Status: DC
  Administered 2020-05-29 – 2020-06-26 (×29): 1 via ORAL
  Filled 2020-05-28 (×29): qty 1

## 2020-05-28 MED ORDER — POTASSIUM CHLORIDE CRYS ER 20 MEQ PO TBCR
40.0000 meq | EXTENDED_RELEASE_TABLET | ORAL | Status: AC
  Administered 2020-05-28 (×2): 40 meq via ORAL
  Filled 2020-05-28 (×2): qty 2

## 2020-05-28 MED ORDER — SODIUM CHLORIDE 0.9% FLUSH: 10.0000 mL | INTRAVENOUS | Status: DC | PRN

## 2020-05-28 MED ORDER — ONDANSETRON HCL 4 MG/2ML IJ SOLN
4.0000 mg | Freq: Four times a day (QID) | INTRAMUSCULAR | Status: DC | PRN
  Administered 2020-05-30 – 2020-06-25 (×4): 4 mg via INTRAVENOUS
  Filled 2020-05-28 (×4): qty 2

## 2020-05-28 MED ORDER — MAGNESIUM SULFATE IN D5W 1-5 GM/100ML-% IV SOLN
1.0000 g | Freq: Once | INTRAVENOUS | Status: AC
  Administered 2020-05-28: 1 g via INTRAVENOUS
  Filled 2020-05-28: qty 100

## 2020-05-28 MED ORDER — METOCLOPRAMIDE HCL 5 MG/ML IJ SOLN
5.0000 mg | Freq: Four times a day (QID) | INTRAMUSCULAR | Status: AC
  Administered 2020-05-28 – 2020-05-29 (×4): 5 mg via INTRAVENOUS
  Filled 2020-05-28 (×4): qty 2

## 2020-05-28 NOTE — Progress Notes (Signed)
Initial Nutrition Assessment  DOCUMENTATION CODES:   Non-severe (moderate) malnutrition in context of chronic illness  INTERVENTION:   Ensure Enlive po BID, each supplement provides 350 kcal and 20 grams of protein  MVI with minerals  NUTRITION DIAGNOSIS:   Moderate Malnutrition related to chronic illness (CHF, LLE edema) as evidenced by mild fat depletion, severe muscle depletion, moderate muscle depletion.   GOAL:   Patient will meet greater than or equal to 90% of their needs   MONITOR:   PO intake, Supplement acceptance, Labs, Weight trends, Skin  REASON FOR ASSESSMENT:   Consult Assessment of nutrition requirement/status, Wound healing  ASSESSMENT:   Pt with PMH of HTN, HLD, chronic LLE, peripheral neuropathy, essential tremor, and prostate cancer. Presented with CHF exacerbation and severe low flow AS. S/p right/left heart cath and coronary angiography on 11/22. Underwent CABG x4 and tissue AVR on 11/26.   Pt states his appetite has been decreased d/t nausea over the last couple of days. After surgery on the 26th, meal completions are documented 25-100%. Over the last couple of days, documented at 0%.  His appetite was good PTA. Typical intake: Breakfast: eggs, english muffin, Lunch: sandwich, Dinner: unknown. Unsure if intake was adequate PTA.   Pt states his UBW is 172 lbs. He is unaware of any recent weight loss but stated he has a lot of edema causing his weight to increase. Weight fluctuating d/t edema this admission.   Labs reviewed: Glucose: 126, Sodium: 131, Chloride: 93   Medications reviewed and include: dolcolax, colace, SSI, MVI with minerals, protonix, trinsicon/foltrin   NUTRITION - FOCUSED PHYSICAL EXAM:    Most Recent Value  Orbital Region No depletion  Upper Arm Region Moderate depletion  Thoracic and Lumbar Region Unable to assess  Buccal Region Mild depletion  Temple Region Moderate depletion  Clavicle Bone Region Severe depletion   Clavicle and Acromion Bone Region Severe depletion  Scapular Bone Region Severe depletion  Dorsal Hand Moderate depletion  Patellar Region Unable to assess  Anterior Thigh Region Unable to assess  Posterior Calf Region Unable to assess  Edema (RD Assessment) Moderate  Hair Reviewed  Eyes Reviewed  Mouth Reviewed  Skin Reviewed  Nails Reviewed       Diet Order:   Diet Order            Diet Heart Room service appropriate? Yes; Fluid consistency: Thin  Diet effective now                 EDUCATION NEEDS:   Education needs have been addressed  Skin:  Skin Assessment: Reviewed RN Assessment  Last BM:  12/1  Height:   Ht Readings from Last 1 Encounters:  05/23/20 5\' 9"  (1.753 m)    Weight:   Wt Readings from Last 1 Encounters:  05/28/20 87.5 kg    Ideal Body Weight:  72.7 kg  BMI:  Body mass index is 28.49 kg/m.  Estimated Nutritional Needs:   Kcal:  2100-2300  Protein:  110-120  Fluid:  >/= 2 L/day    Ronnald Nian, Dietetic Intern Pager: [phone] If unavailable: [phone]

## 2020-05-28 NOTE — Progress Notes (Signed)
Peripherally Inserted Central Catheter Placement  The IV Nurse has discussed with the patient and/or persons authorized to consent for the patient, the purpose of this procedure and the potential benefits and risks involved with this procedure.  The benefits include less needle sticks, lab draws from the catheter, and the patient may be discharged home with the catheter. Risks include, but not limited to, infection, bleeding, blood clot (thrombus formation), and puncture of an artery; nerve damage and irregular heartbeat and possibility to perform a PICC exchange if needed/ordered by physician.  Alternatives to this procedure were also discussed.  Bard Power PICC patient education guide, fact sheet on infection prevention and patient information card has been provided to patient /or left at bedside.    PICC Placement Documentation  PICC Double Lumen 05/28/20 PICC Right Brachial 39 cm 0 cm (Active)  Indication for Insertion or Continuance of Line Prolonged intravenous therapies 05/28/20 1122  Exposed Catheter (cm) 0 cm 05/28/20 1122  Site Assessment Clean;Dry;Intact 05/28/20 1122  Lumen #1 Status Flushed;Saline locked;Blood return noted 05/28/20 1122  Lumen #2 Status Flushed;Saline locked;Blood return noted 05/28/20 1122  Dressing Type Transparent;Securing device 05/28/20 1122  Dressing Status Clean;Dry;Intact 05/28/20 1122  Antimicrobial disc in place? Yes 05/28/20 1122  Safety Lock Not Applicable 69/24/93 2419  Dressing Intervention New dressing 05/28/20 1122  Dressing Change Due 06/04/20 05/28/20 1122       Enos Fling 05/28/2020, 11:24 AM

## 2020-05-28 NOTE — Progress Notes (Signed)
  Echocardiogram 2D Echocardiogram has been performed.  Fidel Levy 05/28/2020, 3:05 PM

## 2020-05-28 NOTE — Progress Notes (Signed)
Advanced Heart Failure Rounding Note  PCP-Cardiologist: Peter Martinique, MD   Subjective:    -3 L in UOP yesterday on lasix gtt. Urine clear in foley bag. Wt down 5 lb. BMP pending. CVP not working.   Remains on Dopamine 3 + NE 4. Co-ox remains marginal at 54%. MAPs in 80s.   OOB sitting up in chair. Eating breakfast. Feels slightly better.    Objective:   Weight Range: 87.5 kg Body mass index is 28.49 kg/m.   Vital Signs:   Temp:  [97.4 F (36.3 C)-99.1 F (37.3 C)] 97.5 F (36.4 C) (12/01 0700) Pulse Rate:  [89-96] 90 (12/01 0300) Resp:  [19-34] 26 (12/01 0300) BP: (82-117)/(64-85) 110/78 (12/01 0300) SpO2:  [91 %-97 %] 96 % (12/01 0300) Arterial Line BP: (82-92)/(53-87) 92/87 (11/30 1200) Weight:  [87.5 kg] 87.5 kg (12/01 0630) Last BM Date: 05/27/20  Weight change: Filed Weights   05/26/20 0600 05/27/20 0500 05/28/20 0630  Weight: 88.1 kg 89.5 kg 87.5 kg    Intake/Output:   Intake/Output Summary (Last 24 hours) at 05/28/2020 0713 Last data filed at 05/28/2020 0102 Gross per 24 hour  Intake 893.84 ml  Output 3045 ml  Net -2151.16 ml      Physical Exam    General:  Well appearing elderly male, sitting up in chair. No resp difficulty HEENT: Normal Neck: Supple. JVP elevated to ear . Carotids 2+ bilat; no bruits. No lymphadenopathy or thyromegaly appreciated. Cor: PMI nondisplaced. Regular rate & rhythm. No rubs, gallops or murmurs. + pacing wires  Lungs: decreased BS at bases bilaterally  Abdomen: Soft, nontender, nondistended. No hepatosplenomegaly. No bruits or masses. Good bowel sounds. Extremities: No cyanosis, clubbing, rash, 1+ bilateral LEE edema Neuro: Alert & orientedx3, cranial nerves grossly intact. moves all 4 extremities w/o difficulty. Affect pleasant   Telemetry   A-V paced rhythm 80s  EKG    No new EKG to review   Labs    CBC Recent Labs    05/26/20 0424 05/26/20 0424 05/26/20 1750 05/27/20 0328  WBC 8.9  --   --  9.2   HGB 8.2*   < > 8.8* 8.9*  HCT 24.4*   < > 26.0* 27.1*  MCV 93.5  --   --  93.1  PLT 87*  --   --  122*   < > = values in this interval not displayed.   Basic Metabolic Panel Recent Labs    05/26/20 0424 05/26/20 0424 05/26/20 1750 05/27/20 0328  NA 128*   < > 127* 127*  K 4.0   < > 3.8 4.0  CL 97*  --   --  93*  CO2 23  --   --  23  GLUCOSE 121*  --   --  150*  BUN 24*  --   --  26*  CREATININE 1.32*  --   --  1.39*  CALCIUM 8.1*  --   --  8.3*  MG 2.0  --   --   --    < > = values in this interval not displayed.   Liver Function Tests No results for input(s): AST, ALT, ALKPHOS, BILITOT, PROT, ALBUMIN in the last 72 hours. No results for input(s): LIPASE, AMYLASE in the last 72 hours. Cardiac Enzymes No results for input(s): CKTOTAL, CKMB, CKMBINDEX, TROPONINI in the last 72 hours.  BNP: BNP (last 3 results) Recent Labs    11/19/19 1550 05/16/20 1736  BNP 1,139.7* 2,005.2*    ProBNP (last  3 results) Recent Labs    01/02/20 1134  PROBNP 1,167.0*     D-Dimer No results for input(s): DDIMER in the last 72 hours. Hemoglobin A1C No results for input(s): HGBA1C in the last 72 hours. Fasting Lipid Panel No results for input(s): CHOL, HDL, LDLCALC, TRIG, CHOLHDL, LDLDIRECT in the last 72 hours. Thyroid Function Tests No results for input(s): TSH, T4TOTAL, T3FREE, THYROIDAB in the last 72 hours.  Invalid input(s): FREET3  Other results:   Imaging    [place] EKG SITE RITE  Result Date: 05/27/2020 If Site Rite image not attached, placement could not be confirmed due to current cardiac rhythm.  [place] EKG SITE RITE  Result Date: 05/27/2020 If Site Rite image not attached, placement could not be confirmed due to current cardiac rhythm.     Medications:     Scheduled Medications: . acetaminophen  1,000 mg Oral Q6H   Or  . acetaminophen (TYLENOL) oral liquid 160 mg/5 mL  1,000 mg Per Tube Q6H  . amiodarone  400 mg Oral BID  . aspirin EC  81 mg Oral Daily   . bisacodyl  10 mg Oral Daily   Or  . bisacodyl  10 mg Rectal Daily  . Chlorhexidine Gluconate Cloth  6 each Topical Daily  . docusate sodium  200 mg Oral Daily  . ferrous [id number] C-folic acid  1 capsule Oral BID PC  . insulin aspart  0-15 Units Subcutaneous TID WC  . mouth rinse  15 mL Mouth Rinse BID  . midodrine  10 mg Oral TID  . pantoprazole  40 mg Oral Daily  . potassium chloride  20 mEq Oral BID     Infusions: . DOPamine 3 mcg/kg/min (05/28/20 1884)  . furosemide (LASIX) 200 mg in dextrose 5% 100 mL (2mg /mL) infusion 8 mg/hr (05/28/20 0648)  . lactated ringers Stopped (05/27/20 0813)  . norepinephrine (LEVOPHED) Adult infusion 4 mcg/min (05/28/20 0648)     PRN Medications:  melatonin, ondansetron (ZOFRAN) IV, oxyCODONE, traMADol   Assessment/Plan   1. Severe 3V CAD: - s/p CABGx 4 (LIMA-LAD, sequential SVG-OM1 and OM2, SVG- PDA) - chest pain free  - continue ASA 81 - no ? blocker w/ post-cardiotomy shock and low BP - consider addition of statin   2. Aortic Stenosis: - severe low flow low gradient AS - s/p tissue AVR 11/26   3. Acute on Chronic Biventricular Heart Failure:  - Echo in 2014 showed normal LVEF 55-60% w/ G1DD.  - Echo 6/21 showed mildly reduced EF down to 40-45%.  - Echo repeated again 10/21 and EF had further dropped to 30-35% w/ global HK. RV systolic function mildly reduced. - LHC this admit w/ severe 3V CAD>>ICM. Now s/p CABG. Also w/ severe AS, possibly contributing to LV dysfunction, now s/p SAVR - post op recovery c/b difficulties weaning pressors/inotropes. On NE 4 + Dopamine 3 + midodrine 10 mg tid. Did not tolerate milrinone due to hypotension (discontinued 11/29). MAPs in the 80s. Co-ox marginal at 54%.  - responding well to lasix gtt but remains fluid overloaded, ~25 lb above preopt wt   - continue lasix gtt at 8 mg/hr. Place PICC and follow CVPs - given concomitant aortic stenosis, h/o recurrent pleural effusions,  hypotension and peripheral neuropathy, ? Amyloidosis. Consider cMRI - Add TEE hoses  - hypotension limits use of ARNi/ARB and spiro - consider addition of digoxin    4. Post Operative Atrial Fibrillation:   - continues w/ temp pacing wires, currently A-V paced -  continue PO amiodarone 400 mg bid  - defer a/c to CT surgery    5. AKI:   - Scr 1.3 yesterday (baseline <1.0)  - BMP pending  - continue inotropes/ pressors and keep SBP >100  - diuresis per above  6. Hyponatremia - Na 131 - hypervolemic hyponatremia - diureses w/ IV Lasix   7. Anemia - expected ABLA from surgery  - Hgb stable and up trending, 9.1 today  - on PO Fe   Length of Stay: [address] PA-C  05/28/2020, 7:13 AM  Advanced Heart Failure Team Pager [phone] (M-F; [place])  Please contact [facility] for night-coverage after hours (4p -7a ) and weekends on [email or web address]  Agree with above.  Started on lasix gtt yesterday with good urine output. Remains on NE and DA. Co-ox 54%. Creatinine stable at 1.4. Feels slightly better.    General:  Elderly. Lying in bed.   No resp difficulty HEENT: normal Neck: supple. no JVD. Carotids 2+ bilat; no bruits. No lymphadenopathy or thryomegaly appreciated. Cor: PMI nondisplaced. Regular rate & rhythm. No rubs, gallops or murmurs. Lungs: clear Abdomen: soft, nontender, nondistended. No hepatosplenomegaly. No bruits or masses. Good bowel sounds. Extremities: no cyanosis, clubbing, rash, edema Neuro: alert & orientedx3, cranial nerves grossly intact. moves all 4 extremities w/o difficulty. Affect pleasant  Making a bit of progress. Beginning to diurese. Still with nearly 25 pounds of fluid on board. Would continue lasix gtt. Would not down titrate pressors/inotropes until fluid is removed. Wrap LE. Await repeat echo today. Continue to mobilize aggressively.  CRITICAL CARE Performed by: Glori Bickers  Total critical care time: 35 minutes  Critical  care time was exclusive of separately billable procedures and treating other patients.  Critical care was necessary to treat or prevent imminent or life-threatening deterioration.  Critical care was time spent personally by me (independent of midlevel providers or residents) on the following activities: development of treatment plan with patient and/or surrogate as well as nursing, discussions with consultants, evaluation of patient's response to treatment, examination of patient, obtaining history from patient or surrogate, ordering and performing treatments and interventions, ordering and review of laboratory studies, ordering and review of radiographic studies, pulse oximetry and re-evaluation of patient's condition.  Glori Bickers, MD  8:11 AM

## 2020-05-28 NOTE — Progress Notes (Signed)
Physical Therapy Treatment Patient Details Name: [name] [name] MRN: [id number] DOB: 09/24/1927 Today's Date: 05/28/2020    History of Present Illness 86 yo male presenting to PCP with worsening LE edema and dyspnea. S/p right/left heart cath and coronary angiography on 11/22. S/p CABG x4 on 11/26. PMH including venous insufficiency with chronic lower extremity edema (mostly on the left), hypertension, hyperlipidemia, peripheral neuropathy, Bil TKA,  essential tremor, and prostate cancer.    PT Comments    Pt looks better today, however has c/o nausea after PICC line placement. RN in room and offered medication before getting up with therapy. Pt reports fatigue however is agreeable to coming to EoB to take his medication and drink his Ensure. Pt is limited by decreased strength, balance and endurance, requiring modAx2 for bed mobility, transfers and stepping laterally along side of bed. Further progression of ambulation prohibited by cardiologist due to increased dizziness with standing due to Levophed. D/c plans remain appropriate at this time. PT will continue to follow acutely.   Follow Up Recommendations  SNF     Equipment Recommendations  None recommended by PT (has RW at home)       Precautions / Restrictions Precautions Precautions: Sternal Precaution Comments: Reviewed sternal precautions Restrictions Weight Bearing Restrictions: Yes Other Position/Activity Restrictions: sternal    Mobility  Bed Mobility Overal bed mobility: Needs Assistance Bed Mobility: Supine to Sit;Sit to Supine     Supine to sit: Mod assist;+2 for physical assistance Sit to supine: Mod assist;+2 for physical assistance   General bed mobility comments: modAx2 for managing LE off bed and for bringing trunk to upright and pad scoot of hips to EoB, requires modA for management of trunk and LE back into bed  Transfers Overall transfer level: Needs assistance Equipment used: 2 person hand held  assist Transfers: Sit to/from Stand Sit to Stand: Mod assist         General transfer comment: modA for power up and steadying, requires blocking of feet for forward slide vc for upright posture, pt with c/o dizziness  Ambulation/Gait Ambulation/Gait assistance: Mod assist Gait Distance (Feet): 2 Feet Assistive device: 2 person hand held assist Gait Pattern/deviations: Step-to pattern Gait velocity: slowed Gait velocity interpretation: <1.31 ft/sec, indicative of household ambulator General Gait Details: modA for steadying with lateral stepping toward HoB         Balance Overall balance assessment: Needs assistance Sitting-balance support: No upper extremity supported;Feet supported Sitting balance-Leahy Scale: Fair Sitting balance - Comments: min guard for safety   Standing balance support: Bilateral upper extremity supported Standing balance-Leahy Scale: Poor Standing balance comment: requires outside assist on B UE to maintain standing balance                            Cognition Arousal/Alertness: Awake/alert Behavior During Therapy: WFL for tasks assessed/performed Overall Cognitive Status: Within Functional Limits for tasks assessed                                           General Comments General comments (skin integrity, edema, etc.): SaO2 on 4L O2 via [place] SaO2 >90%O2, HR 90s       Pertinent Vitals/Pain Pain Assessment: Faces Faces Pain Scale: Hurts little more Pain Location: chest surgical site Pain Descriptors / Indicators: Sore;Aching;Grimacing;Guarding Pain Intervention(s): Limited activity within patient's tolerance;Monitored during session;Repositioned;RN gave pain  meds during session           PT Goals (current goals can now be found in the care plan section) Acute Rehab PT Goals Patient Stated Goal: "Get better and go home" PT Goal Formulation: With patient Time For Goal Achievement: 06/09/20 Potential to Achieve  Goals: Fair Progress towards PT goals: Progressing toward goals    Frequency    Min 3X/week      PT Plan Current plan remains appropriate    Co-evaluation PT/OT/SLP Co-Evaluation/Treatment: Yes Reason for Co-Treatment: Complexity of the patient's impairments (multi-system involvement) PT goals addressed during session: Mobility/safety with mobility        AM-PAC PT "6 Clicks" Mobility   Outcome Measure  Help needed turning from your back to your side while in a flat bed without using bedrails?: A Little Help needed moving from lying on your back to sitting on the side of a flat bed without using bedrails?: Total Help needed moving to and from a bed to a chair (including a wheelchair)?: Total Help needed standing up from a chair using your arms (e.g., wheelchair or bedside chair)?: Total Help needed to walk in hospital room?: Total Help needed climbing 3-5 steps with a railing? : Total 6 Click Score: 8    End of Session Equipment Utilized During Treatment: Oxygen Activity Tolerance: Treatment limited secondary to medical complications (Comment) (limited due to dizziness from Levophed) Patient left: in chair;with call bell/phone within reach;with nursing/sitter in room   PT Visit Diagnosis: Muscle weakness (generalized) (M62.81);Difficulty in walking, not elsewhere classified (R26.2);Other abnormalities of gait and mobility (R26.89);Pain Pain - part of body:  (sternum)     Time: 6759-1638 PT Time Calculation (min) (ACUTE ONLY): 38 min  Charges:  $Therapeutic Activity: 23-37 mins                     Kasumi Ditullio B. Migdalia Dk PT, DPT Acute Rehabilitation Services Pager [phone] Office [phone]    [place] 05/28/2020, 1:47 PM

## 2020-05-28 NOTE — Progress Notes (Signed)
5 Days Post-Op Procedure(s) (LRB): AORTIC VALVE REPLACEMENT (AVR) USING EDWARDS RESILIA 29 MM AORTIC VALVE. (N/A) CORONARY ARTERY BYPASS GRAFTING (CABG) USING LIMA to LAD; ENDOSCOPIC HARVESTED RIGHT GREATER SAPHENOUS VEIN: SVG to PD; SVG to SEQUENCED INTERMEDIATE to OM (N/A) TRANSESOPHAGEAL ECHOCARDIOGRAM (TEE) (N/A) ENDOVEIN HARVEST OF GREATER SAPHENOUS VEIN (Right) Subjective: No complaints. Says he feels better this am.   Objective: Vital signs in last 24 hours: Temp:  [97.4 F (36.3 C)-99.1 F (37.3 C)] 97.5 F (36.4 C) (12/01 0700) Pulse Rate:  [89-96] 90 (12/01 0300) Cardiac Rhythm: A-V Sequential paced (11/30 2000) Resp:  [19-34] 26 (12/01 0300) BP: (82-117)/(64-85) 110/78 (12/01 0300) SpO2:  [91 %-97 %] 96 % (12/01 0300) Arterial Line BP: (82-92)/(53-87) 92/87 (11/30 1200) Weight:  [87.5 kg] 87.5 kg (12/01 0630)  Hemodynamic parameters for last 24 hours: CVP:  [10 mmHg] 10 mmHg  Intake/Output from previous day: 11/30 0701 - 12/01 0700 In: 893.8 [P.O.:500; I.V.:393.8] Out: 0258 [Urine:3045] Intake/Output this shift: No intake/output data recorded.  General appearance: alert and cooperative Neurologic: intact Heart: regular rate and rhythm, S1, S2 normal, no murmur Lungs: diminished breath sounds bibasilar Abdomen: soft, non-tender; bowel sounds normal Extremities: edema moderate but face less edematous Wound: dressing dry  Lab Results: Recent Labs    05/26/20 0424 05/26/20 0424 05/26/20 1750 05/27/20 0328  WBC 8.9  --   --  9.2  HGB 8.2*   < > 8.8* 8.9*  HCT 24.4*   < > 26.0* 27.1*  PLT 87*  --   --  122*   < > = values in this interval not displayed.   BMET:  Recent Labs    05/26/20 0424 05/26/20 0424 05/26/20 1750 05/27/20 0328  NA 128*   < > 127* 127*  K 4.0   < > 3.8 4.0  CL 97*  --   --  93*  CO2 23  --   --  23  GLUCOSE 121*  --   --  150*  BUN 24*  --   --  26*  CREATININE 1.32*  --   --  1.39*  CALCIUM 8.1*  --   --  8.3*   < > =  values in this interval not displayed.    PT/INR: No results for input(s): LABPROT, INR in the last 72 hours. ABG    Component Value Date/Time   PHART 7.381 05/26/2020 1750   HCO3 22.2 05/26/2020 1750   TCO2 23 05/26/2020 1750   ACIDBASEDEF 2.0 05/26/2020 1750   O2SAT 54.4 05/28/2020 0625   CBG (last 3)  Recent Labs    05/27/20 1549 05/27/20 2146 05/28/20 0706  GLUCAP 132* 152* 131*    Assessment/Plan: S/P Procedure(s) (LRB): AORTIC VALVE REPLACEMENT (AVR) USING EDWARDS RESILIA 29 MM AORTIC VALVE. (N/A) CORONARY ARTERY BYPASS GRAFTING (CABG) USING LIMA to LAD; ENDOSCOPIC HARVESTED RIGHT GREATER SAPHENOUS VEIN: SVG to PD; SVG to SEQUENCED INTERMEDIATE to OM (N/A) TRANSESOPHAGEAL ECHOCARDIOGRAM (TEE) (N/A) ENDOVEIN HARVEST OF GREATER SAPHENOUS VEIN (Right)  POD 5  Remains on NE but weaned to 4 mcg. Dop 3. Keeping SBP> 100. Able to wean pressors despite diuresis. Co-ox 54,4 through sleeve this am. Dropped BP and felt dizzy when NE paused to draw this. No CVP checked this am due to this.  Rhythm under pacer looks sinus with frequent PAC's and 1st degree AV block. Will continue AV pacing at 90 for now to keep regular rhythm and maximize CO.  Volume excess: diuresed -2100 cc yesterday on lasix drip. Wt  down 4 lbs. Continue lasix drip. BMET pending this am. UO is 300-350/hr on lasix drip.  DM: glucose under good control.  Plan PICC today so sleeve can come out.  Continue diuresis today and wean NE as tolerated. Work on IS.  Heart failure team is following.   LOS: 12 days    Gaye Pollack 05/28/2020

## 2020-05-28 NOTE — Progress Notes (Signed)
TCTS Evening Rounds  POD #5 s/p CABG/AVR Remains on NE and is being diuresed; remains A-V paced.  Resting quietly, arousable CTA RRR Mild edema  BP 105/74   Pulse 89   Temp (!) 97.5 F (36.4 C) (Oral)   Resp 11   Ht 5\' 9"  (1.753 m)   Wt 87.5 kg   SpO2 90%   BMI 28.49 kg/m    Intake/Output Summary (Last 24 hours) at 05/28/2020 1927 Last data filed at 05/28/2020 1900 Gross per 24 hour  Intake 439.18 ml  Output 4015 ml  Net -3575.82 ml    A/P continue present management overnight  [name] [name] Z. Orvan Seen, [place]

## 2020-05-28 NOTE — Progress Notes (Signed)
Pt assisted to sit at EOB. Participated in drinking Ensure and taking medications. Stood with +2 mod assist and took several steps to [facility]. + dizziness related to medication.    05/28/20 1349  OT Visit Information  Last OT Received On 05/28/20  Assistance Needed +2  History of Present Illness 86 yo male presenting to PCP with worsening LE edema and dyspnea. S/p right/left heart cath and coronary angiography on 11/22. S/p CABG x4 on 11/26. PMH including venous insufficiency with chronic lower extremity edema (mostly on the left), hypertension, hyperlipidemia, peripheral neuropathy, Bil TKA,  essential tremor, and prostate cancer.  Precautions  Precautions Fall;Sternal  Precaution Comments Reviewed sternal precautions  Pain Assessment  Pain Assessment Faces  Faces Pain Scale 4  Pain Location chest surgical site  Pain Descriptors / Indicators Sore;Aching;Grimacing;Guarding  Pain Intervention(s) Monitored during session;Repositioned;RN gave pain meds during session  Cognition  Arousal/Alertness Awake/alert  Behavior During Therapy [facility] for tasks assessed/performed  Overall Cognitive Status Within Functional Limits for tasks assessed  ADL  Overall ADL's  Needs assistance/impaired  Lower Body Dressing Total assistance;Bed level  Bed Mobility  Overal bed mobility Needs Assistance  Bed Mobility Supine to Sit;Sit to Supine  Supine to sit Mod assist;+2 for physical assistance  Sit to supine Mod assist;+2 for physical assistance  General bed mobility comments modAx2 for managing LE off bed and for bringing trunk to upright and pad scoot of hips to EoB, requires modA for management of trunk and LE back into bed  Balance  Overall balance assessment Needs assistance  Sitting-balance support No upper extremity supported;Feet supported  Sitting balance-Leahy Scale Fair  Sitting balance - Comments min guard for safety, sat x 20 min EOB  Standing balance-Leahy Scale Poor  Standing balance comment  requires external assist B UE to maintain standing balance  Transfers  Overall transfer level Needs assistance  Equipment used 2 person hand held assist  Transfers Sit to/from Stand  Sit to Stand Mod assist;+2 physical assistance;From elevated surface  General transfer comment modA for power up and steadying, requires blocking of feet for forward slide vc for upright posture, pt with c/o dizziness  OT - End of Session  Equipment Utilized During Treatment Oxygen (4L)  Activity Tolerance Other (comment) (pt on levophed, + dizziness)  Patient left with call bell/phone within reach;in bed;with nursing/sitter in room  Nurse Communication Mobility status  OT Assessment/Plan  OT Plan Discharge plan remains appropriate  OT Visit Diagnosis Unsteadiness on feet (R26.81);Other abnormalities of gait and mobility (R26.89);Muscle weakness (generalized) (M62.81);Pain  OT Frequency (ACUTE ONLY) Min 2X/week  Follow Up Recommendations SNF  OT Equipment 3 in 1 bedside commode  AM-PAC OT "6 Clicks" Daily Activity Outcome Measure (Version 2)  Help from another person eating meals? 3  Help from another person taking care of personal grooming? 3  Help from another person toileting, which includes using toliet, bedpan, or urinal? 2  Help from another person bathing (including washing, rinsing, drying)? 2  Help from another person to put on and taking off regular upper body clothing? 2  Help from another person to put on and taking off regular lower body clothing? 2  6 Click Score 14  OT Goal Progression  Progress towards OT goals Not progressing toward goals - comment (limited by dizziness, levophed)  Acute Rehab OT Goals  Patient Stated Goal "Get better and go home"  OT Goal Formulation With patient  Time For Goal Achievement 06/09/20  Potential to Achieve Goals Good  OT  Time Calculation  OT Start Time (ACUTE ONLY) 1213  OT Stop Time (ACUTE ONLY) 1251  OT Time Calculation (min) 38 min  OT General  Charges  $OT Visit 1 Visit  OT Treatments  $Therapeutic Activity 8-22 mins  Nestor Lewandowsky, OTR/L Acute Rehabilitation Services Pager: [phone] Office: [phone]

## 2020-05-29 DIAGNOSIS — E440 Moderate protein-calorie malnutrition: Secondary | ICD-10-CM | POA: Insufficient documentation

## 2020-05-29 DIAGNOSIS — Z951 Presence of aortocoronary bypass graft: Secondary | ICD-10-CM | POA: Diagnosis not present

## 2020-05-29 DIAGNOSIS — I5043 Acute on chronic combined systolic (congestive) and diastolic (congestive) heart failure: Secondary | ICD-10-CM | POA: Diagnosis not present

## 2020-05-29 LAB — BASIC METABOLIC PANEL
Anion gap: 14 (ref 5–15)
BUN: 26 mg/dL — ABNORMAL HIGH (ref 8–23)
CO2: 26 mmol/L (ref 22–32)
Calcium: 8.8 mg/dL — ABNORMAL LOW (ref 8.9–10.3)
Chloride: 96 mmol/L — ABNORMAL LOW (ref 98–111)
Creatinine, Ser: 1.3 mg/dL — ABNORMAL HIGH (ref 0.61–1.24)
GFR, Estimated: 52 mL/min — ABNORMAL LOW (ref >60)
Glucose, Bld: 130 mg/dL — ABNORMAL HIGH (ref 70–99)
Potassium: 3.5 mmol/L (ref 3.5–5.1)
Sodium: 136 mmol/L (ref 135–145)

## 2020-05-29 LAB — COOXEMETRY PANEL
Carboxyhemoglobin: 1.2 % (ref 0.5–1.5)
Methemoglobin: 0.8 % (ref 0.0–1.5)
O2 Saturation: 58.9 %
Total hemoglobin: 9.2 g/dL — ABNORMAL LOW (ref 12.0–16.0)

## 2020-05-29 LAB — GLUCOSE, CAPILLARY
Glucose-Capillary: 132 mg/dL — ABNORMAL HIGH (ref 70–99)
Glucose-Capillary: 157 mg/dL — ABNORMAL HIGH (ref 70–99)
Glucose-Capillary: 195 mg/dL — ABNORMAL HIGH (ref 70–99)
Glucose-Capillary: 158 mg/dL — ABNORMAL HIGH (ref 70–99)
Glucose-Capillary: 172 mg/dL — ABNORMAL HIGH (ref 70–99)

## 2020-05-29 LAB — MAGNESIUM: Magnesium: 2 mg/dL (ref 1.7–2.4)

## 2020-05-29 MED ORDER — LORAZEPAM 2 MG/ML IJ SOLN
INTRAMUSCULAR | Status: AC
  Filled 2020-05-29: qty 1

## 2020-05-29 MED ORDER — AMIODARONE HCL 200 MG PO TABS
200.0000 mg | ORAL_TABLET | Freq: Two times a day (BID) | ORAL | Status: DC
  Administered 2020-05-29 – 2020-06-01 (×8): 200 mg via ORAL
  Filled 2020-05-29 (×8): qty 1

## 2020-05-29 MED ORDER — POTASSIUM CHLORIDE CRYS ER 20 MEQ PO TBCR
40.0000 meq | EXTENDED_RELEASE_TABLET | ORAL | Status: AC
  Administered 2020-05-29 (×3): 40 meq via ORAL
  Filled 2020-05-29 (×3): qty 2

## 2020-05-29 NOTE — TOC Progression Note (Signed)
Transition of Care [facility]) - Progression Note    Patient Details  Name: [name] [name] MRN: [id number] Date of Birth: 14-Aug-1927  Transition of Care [facility]) CM/SW [place], [place] Phone Number: 05/29/2020, 4:46 PM  Clinical Narrative:     CSW spoke with patient at bedside and provided SNF bed offers. Patient chose SNF placement with [facility].CSW will need to have Facility start insurance authorization closer to patient being medically ready.  Patient has SN[facility] bed at [facility].   CSW will continue to follow.    Expected Discharge Plan: [place] Barriers to Discharge: Continued Medical Work up  Expected Discharge Plan and Services Expected Discharge Plan: [place] arrangements for the past 2 months: Single Family Home                                       Social Determinants of Health (SDOH) Interventions    Readmission Risk Interventions No flowsheet data found.

## 2020-05-29 NOTE — Progress Notes (Signed)
6 Days Post-Op Procedure(s) (LRB): AORTIC VALVE REPLACEMENT (AVR) USING EDWARDS RESILIA 29 MM AORTIC VALVE. (N/A) CORONARY ARTERY BYPASS GRAFTING (CABG) USING LIMA to LAD; ENDOSCOPIC HARVESTED RIGHT GREATER SAPHENOUS VEIN: SVG to PD; SVG to SEQUENCED INTERMEDIATE to OM (N/A) TRANSESOPHAGEAL ECHOCARDIOGRAM (TEE) (N/A) ENDOVEIN HARVEST OF GREATER SAPHENOUS VEIN (Right) Subjective: Feels better today. Had a good nights sleep and nausea resolved. Appetite has returned and he is sitting up, ate all of breakfast.  Objective: Vital signs in last 24 hours: Temp:  [97 F (36.1 C)-98.4 F (36.9 C)] 97 F (36.1 C) (12/02 0420) Pulse Rate:  [77-97] 90 (12/02 0700) Cardiac Rhythm: A-V Sequential paced (12/02 0400) Resp:  [11-35] 22 (12/02 0700) BP: (83-114)/(58-81) 114/63 (12/02 0700) SpO2:  [90 %-97 %] 95 % (12/02 0700) Weight:  [84 kg-86.8 kg] 84 kg (12/02 0640)  Hemodynamic parameters for last 24 hours: CVP:  [12 mmHg-14 mmHg] 12 mmHg  Intake/Output from previous day: 12/01 0701 - 12/02 0700 In: 542.7 [P.O.:150; I.V.:294.1; IV Piggyback:98.6] Out: 4230 [Urine:4230] Intake/Output this shift: No intake/output data recorded.  General appearance: alert and cooperative Neurologic: intact Heart: regular rate and rhythm, S1, S2 normal, no murmur Lungs: diminished breath sounds bibasilar Extremities: edema moderate Wound: dressing dry  Lab Results: Recent Labs    05/27/20 0328 05/28/20 0723  WBC 9.2 7.3  HGB 8.9* 9.1*  HCT 27.1* 26.7*  PLT 122* 159   BMET:  Recent Labs    05/28/20 0723 05/29/20 0349  NA 131* 136  K 3.5 3.5  CL 93* 96*  CO2 26 26  GLUCOSE 142* 130*  BUN 27* 26*  CREATININE 1.44* 1.30*  CALCIUM 8.8* 8.8*    PT/INR: No results for input(s): LABPROT, INR in the last 72 hours. ABG    Component Value Date/Time   PHART 7.381 05/26/2020 1750   HCO3 22.2 05/26/2020 1750   TCO2 23 05/26/2020 1750   ACIDBASEDEF 2.0 05/26/2020 1750   O2SAT 58.9 05/29/2020 0354    CBG (last 3)  Recent Labs    05/28/20 1622 05/28/20 2129 05/29/20 0640  GLUCAP 174* 100* 132*    Assessment/Plan: S/P Procedure(s) (LRB): AORTIC VALVE REPLACEMENT (AVR) USING EDWARDS RESILIA 29 MM AORTIC VALVE. (N/A) CORONARY ARTERY BYPASS GRAFTING (CABG) USING LIMA to LAD; ENDOSCOPIC HARVESTED RIGHT GREATER SAPHENOUS VEIN: SVG to PD; SVG to SEQUENCED INTERMEDIATE to OM (N/A) TRANSESOPHAGEAL ECHOCARDIOGRAM (TEE) (N/A) ENDOVEIN HARVEST OF GREATER SAPHENOUS VEIN (Right)  POD 6.  Hemodynamically stable on NE 4, dop 3, midodrine.  Rhythm under pacer is irregular with premature beats. I can't tell if this is atrial fib or sinus with frequent extra beats. Will continue AV pacing for now to keep rhythm regular. Can decrease amio to 200 bid.  Volume excess: diuresed -3600 cc yesterday on lasix drip and wt down 6 lbs. Still 19 lbs over preop but making progress.  Echo reviewed. EF still 35%, aortic valve looks good. He has moderate MR but it was only mild in the OR. I suspect this will improve with resolution of volume excess.  Continue PT/OT. May need CIR as he recovers.   LOS: 13 days    Gaye Pollack 05/29/2020

## 2020-05-29 NOTE — Progress Notes (Signed)
Patient ID: [name] [name], male   DOB: Sep 26, 1927, 86 y.o.   MRN: [id number] TCTS Evening Rounds:  Hemodynamically stable on NE 5, dop 3, midodrine.  AV paced 90  Remains on lasix drip. -805 cc today. CVP 16-17. Ate dinner.

## 2020-05-29 NOTE — Progress Notes (Addendum)
Advanced Heart Failure Rounding Note  PCP-Cardiologist: Peter Martinique, MD   Subjective:    -4L in UOP yesterday on lasix gtt. Wt down another 6 lb. SCr down, 1.44>>1.30. CVP 10.  Remains on Dopamine 3 + NE 4. Co-ox 59%.   Echo repeated yesterday. LVEF 30-35% (unchanged). RV systolic function mildly reduced. No pericardial effusion. No aortic valve prosthesis dysfunction.   Sitting up in chair eating breakfast. Appetite improving. Feeling better. No complaints.      Objective:   Weight Range: 84 kg Body mass index is 27.35 kg/m.   Vital Signs:   Temp:  [97 F (36.1 C)-98.4 F (36.9 C)] 97 F (36.1 C) (12/02 0420) Pulse Rate:  [77-97] 90 (12/02 0700) Resp:  [11-35] 22 (12/02 0700) BP: (83-114)/(58-81) 114/63 (12/02 0700) SpO2:  [90 %-97 %] 95 % (12/02 0700) Weight:  [84 kg-86.8 kg] 84 kg (12/02 0640) Last BM Date: 05/28/20  Weight change: Filed Weights   05/28/20 0630 05/29/20 0400 05/29/20 0640  Weight: 87.5 kg 86.8 kg 84 kg    Intake/Output:   Intake/Output Summary (Last 24 hours) at 05/29/2020 0711 Last data filed at 05/29/2020 0600 Gross per 24 hour  Intake 542.72 ml  Output 4230 ml  Net -3687.28 ml      Physical Exam    CVP 10 General:  Well appearing, OBB sitting up in chair. No respiratory difficulty HEENT: normal Neck: supple. JVD elevated to jaw. Carotids 2+ bilat; no bruits. No lymphadenopathy or thyromegaly appreciated. Cor: PMI nondisplaced. Regular rate & rhythm. No rubs, gallops or murmurs.+ temp pacing wires. Sternotomy site bandaged.  Lungs: decreased BS at the bases bilaterally  Abdomen: soft, nontender, nondistended. No hepatosplenomegaly. No bruits or masses. Good bowel sounds. Extremities: no cyanosis, clubbing, rash, 1+ bilateral LE edema Neuro: alert & oriented x 3, cranial nerves grossly intact. moves all 4 extremities w/o difficulty. Affect pleasant.   Telemetry   A-V paced rhythm 80s  EKG    No new EKG to review   Labs     CBC Recent Labs    05/27/20 0328 05/28/20 0723  WBC 9.2 7.3  HGB 8.9* 9.1*  HCT 27.1* 26.7*  MCV 93.1 91.8  PLT 122* 166   Basic Metabolic Panel Recent Labs    05/28/20 0723 05/29/20 0349  NA 131* 136  K 3.5 3.5  CL 93* 96*  CO2 26 26  GLUCOSE 142* 130*  BUN 27* 26*  CREATININE 1.44* 1.30*  CALCIUM 8.8* 8.8*  MG 1.9 2.0   Liver Function Tests No results for input(s): AST, ALT, ALKPHOS, BILITOT, PROT, ALBUMIN in the last 72 hours. No results for input(s): LIPASE, AMYLASE in the last 72 hours. Cardiac Enzymes No results for input(s): CKTOTAL, CKMB, CKMBINDEX, TROPONINI in the last 72 hours.  BNP: BNP (last 3 results) Recent Labs    11/19/19 1550 05/16/20 1736  BNP 1,139.7* 2,005.2*    ProBNP (last 3 results) Recent Labs    01/02/20 1134  PROBNP 1,167.0*     D-Dimer No results for input(s): DDIMER in the last 72 hours. Hemoglobin A1C No results for input(s): HGBA1C in the last 72 hours. Fasting Lipid Panel No results for input(s): CHOL, HDL, LDLCALC, TRIG, CHOLHDL, LDLDIRECT in the last 72 hours. Thyroid Function Tests No results for input(s): TSH, T4TOTAL, T3FREE, THYROIDAB in the last 72 hours.  Invalid input(s): FREET3  Other results:   Imaging    DG CHEST PORT 1 VIEW  Result Date: 05/28/2020 CLINICAL DATA:  PICC  placement. EXAM: PORTABLE CHEST 1 VIEW COMPARISON:  May 27, 2020. FINDINGS: Stable cardiomegaly. Status post aortic valve repair. Right internal jugular sheath is unchanged. Interval placement of right sided PICC line with distal tip in expected position of the SVC. No pneumothorax is noted. Mild bibasilar subsegmental atelectasis is noted with small pleural effusions. Status post right shoulder arthroplasty. IMPRESSION: Interval placement of right sided PICC line with distal tip in expected position of the SVC. Electronically Signed   By: Marijo Conception M.D.   On: 05/28/2020 11:54   ECHOCARDIOGRAM COMPLETE  Result Date:  05/28/2020    ECHOCARDIOGRAM REPORT   Patient Name:   [name] [name] Date of Exam: 05/28/2020 Medical Rec #:  [id number]    Height:       69.0 in Accession #:    [id number]   Weight:       192.9 lb Date of Birth:  Jan 16, 1928    BSA:          2.035 m Patient Age:    37 years     BP:           94/69 mmHg Patient Gender: M            HR:           82 bpm. Exam Location:  Inpatient Procedure: 2D Echo, Color Doppler and Cardiac Doppler Indications:    CHF-Acute Systolic 938.10 / F75.10  History:        Patient has prior history of Echocardiogram examinations, most                 recent 04/01/2020. CAD; Risk Factors:Hypertension and                 Dyslipidemia.                 Aortic Valve: 29 mm Edwards valve is present in the aortic                 position. Procedure Date: 05/23/2020.  Sonographer:    Bernadene Person RDCS Referring Phys: [id number] Ty Buntrock R Jacqlyn Marolf IMPRESSIONS  1. Left ventricular ejection fraction, by estimation, is 30 to 35%. The left ventricle has moderately decreased function. The left ventricle demonstrates global hypokinesis. There is moderate concentric left ventricular hypertrophy. Left ventricular diastolic parameters are indeterminate.  2. Right ventricular systolic function is mildly reduced. The right ventricular size is mildly enlarged.  3. Left atrial size was severely dilated.  4. Right atrial size was moderately dilated.  5. The mitral valve is grossly normal. Moderate to severe mitral valve regurgitation. The mean mitral valve gradient is 1.6 mmHg with average heart rate of 124 bpm.  6. 29 mm Edwards valve is present in the aortic position without evidence of prosthetic valve dysfunction or paravalvular leak. The aortic valve has been repaired/replaced. Aortic valve regurgitation is not visualized. There is a 29 mm Edwards valve present in the aortic position. Procedure Date: 05/23/2020. Comparison(s): A prior study was performed on 04/01/20. Improvement in LV dilational with interval valve  replacement. Mitral regurgitation is still significant. FINDINGS  Left Ventricle: Left ventricular ejection fraction, by estimation, is 30 to 35%. The left ventricle has moderately decreased function. The left ventricle demonstrates global hypokinesis. The left ventricular internal cavity size was normal in size. There is moderate concentric left ventricular hypertrophy. Left ventricular diastolic parameters are indeterminate. Right Ventricle: The right ventricular size is mildly enlarged. Right vetricular wall thickness was not well visualized. Right ventricular  systolic function is mildly reduced. Left Atrium: Left atrial size was severely dilated. Right Atrium: Right atrial size was moderately dilated. Pericardium: There is no evidence of pericardial effusion. Mitral Valve: The mitral valve is grossly normal. Moderate to severe mitral valve regurgitation. The mean mitral valve gradient is 1.6 mmHg with average heart rate of 124 bpm. Tricuspid Valve: The tricuspid valve is normal in structure. Tricuspid valve regurgitation is mild. Aortic Valve: 29 mm Edwards valve is present in the aortic position without evidence of prosthetic valve dysfunction or paravalvular leak. The aortic valve has been repaired/replaced. Aortic valve regurgitation is not visualized. Aortic valve mean gradient measures 4.3 mmHg. Aortic valve peak gradient measures 6.7 mmHg. Aortic valve area, by VTI measures 2.59 cm. There is a 29 mm Edwards valve present in the aortic position. Procedure Date: 05/23/2020. Pulmonic Valve: The pulmonic valve was not well visualized. Pulmonic valve regurgitation is not visualized. Aorta: The aortic root and ascending aorta are structurally normal, with no evidence of dilitation. Venous: A systolic blunting flow pattern is recorded from the right lower pulmonary vein. IAS/Shunts: The atrial septum is grossly normal.  LEFT VENTRICLE PLAX 2D LVIDd:         5.20 cm LVIDs:         4.12 cm LV PW:         1.22 cm  LV IVS:        1.26 cm LVOT diam:     2.00 cm LV SV:         52 LV SV Index:   26 LVOT Area:     3.14 cm  LV Volumes (MOD) LV vol d, MOD A2C: 130.0 ml LV vol d, MOD A4C: 146.0 ml LV vol s, MOD A2C: 98.8 ml LV vol s, MOD A4C: 106.0 ml LV SV MOD A2C:     31.2 ml LV SV MOD A4C:     146.0 ml LV SV MOD BP:      37.1 ml RIGHT VENTRICLE TAPSE (M-mode): 1.8 cm LEFT ATRIUM              Index       RIGHT ATRIUM           Index LA diam:        4.20 cm  2.06 cm/m  RA Area:     33.00 cm LA Vol (A2C):   108.0 ml 53.08 ml/m RA Volume:   118.00 ml 58.00 ml/m LA Vol (A4C):   97.8 ml  48.07 ml/m LA Biplane Vol: 106.0 ml 52.10 ml/m  AORTIC VALVE AV Area (Vmax):    2.86 cm AV Area (Vmean):   2.72 cm AV Area (VTI):     2.59 cm AV Vmax:           129.33 cm/s AV Vmean:          95.333 cm/s AV VTI:            0.202 m AV Peak Grad:      6.7 mmHg AV Mean Grad:      4.3 mmHg LVOT Vmax:         117.67 cm/s LVOT Vmean:        82.467 cm/s LVOT VTI:          0.167 m LVOT/AV VTI ratio: 0.83  AORTA Ao Root diam: 3.60 cm Ao Asc diam:  3.70 cm MITRAL VALVE                 TRICUSPID VALVE  MV Mean grad: 1.6 mmHg       TR Peak grad:   22.5 mmHg MR Peak grad:    80.6 mmHg   TR Vmax:        237.00 cm/s MR Mean grad:    56.0 mmHg MR Vmax:         449.00 cm/s SHUNTS MR Vmean:        360.0 cm/s  Systemic VTI:  0.17 m MR PISA:         2.26 cm    Systemic Diam: 2.00 cm MR PISA Eff ROA: 19 mm MR PISA Radius:  0.60 cm Rudean Haskell MD Electronically signed by Rudean Haskell MD Signature Date/Time: 05/28/2020/5:03:32 PM    Final      Medications:     Scheduled Medications: . amiodarone  400 mg Oral BID  . aspirin EC  81 mg Oral Daily  . bisacodyl  10 mg Oral Daily   Or  . bisacodyl  10 mg Rectal Daily  . Chlorhexidine Gluconate Cloth  6 each Topical Daily  . docusate sodium  200 mg Oral Daily  . feeding supplement  237 mL Oral BID BM  . ferrous [id number] C-folic acid  1 capsule Oral BID PC  . insulin aspart   0-15 Units Subcutaneous TID WC  . mouth rinse  15 mL Mouth Rinse BID  . metoCLOPramide (REGLAN) injection  5 mg Intravenous Q6H  . midodrine  10 mg Oral TID  . multivitamin with minerals  1 tablet Oral Daily  . pantoprazole  40 mg Oral Daily  . sodium chloride flush  10-40 mL Intracatheter Q12H    Infusions: . DOPamine 3 mcg/kg/min (05/29/20 0600)  . furosemide (LASIX) 200 mg in dextrose 5% 100 mL (2mg /mL) infusion 8 mg/hr (05/29/20 0600)  . norepinephrine (LEVOPHED) Adult infusion 4 mcg/min (05/29/20 0600)    PRN Medications: melatonin, ondansetron (ZOFRAN) IV, oxyCODONE, sodium chloride flush, traMADol   Assessment/Plan   1. Severe 3V CAD: - s/p CABGx 4 (LIMA-LAD, sequential SVG-OM1 and OM2, SVG- PDA) - chest pain free  - continue ASA 81 - no ? blocker w/ post-cardiotomy shock and low BP - consider addition of statin   2. Aortic Stenosis: - severe low flow low gradient AS - s/p tissue AVR 11/26  - AV prosthesis ok on post op echo   3. Acute on Chronic Biventricular Heart Failure:  - Echo in 2014 showed normal LVEF 55-60% w/ G1DD.  - Echo 6/21 showed mildly reduced EF down to 40-45%.  - Echo repeated again 10/21 and EF had further dropped to 30-35% w/ global HK. RV systolic function mildly reduced. - LHC this admit w/ severe 3V CAD>>ICM. Now s/p CABG. Also w/ severe AS, possibly contributing to LV dysfunction, now s/p SAVR - post op recovery c/b difficulties weaning pressors/inotropes. On NE 4 + Dopamine 3 + midodrine 10 mg tid. Did not tolerate milrinone due to hypotension (discontinued 11/29). MAPs in the 80s. Co-ox 59% - Repeat echo 12/1 EF 30-35%, RV mildly reduced. AV prothesis ok  - responding well to lasix gtt but remains fluid overloaded, ~20 lb above preopt wt. CVP 10   - continue lasix gtt at 8 mg/hr.  - Would not down titrate pressors/inotropes until fluid is removed - given concomitant aortic stenosis, h/o recurrent pleural effusions, hypotension and  peripheral neuropathy, ? Amyloidosis. Consider cMRI - Add TEE hoses  - hypotension limits use of ARNi/ARB and spiro - consider addition of digoxin    4.  Post Operative Atrial Fibrillation:   - continues w/ temp pacing wires, currently A-V paced - continue PO amiodarone 400 mg bid  - defer a/c to CT surgery    5. AKI:   - Baseline <1.0. Peaked at 1.44 this admit  - stable w/ diuresis, 1.3 today  - continue inotropes/ pressors and keep SBP >100  - diuresis per above  6. Hyponatremia - improved 136 today  - hypervolemic hyponatremia - cont diureses w/ IV Lasix   7. Anemia - expected ABLA from surgery  - Hgb stable  - on PO Fe   Length of Stay: [address] PA-C  05/29/2020, 7:11 AM  Advanced Heart Failure Team Pager [phone] (M-F; [place])  Please contact [facility] for night-coverage after hours (4p -7a ) and weekends on [email or web address]  Agree with above.   Remains on milrinone and NE and lasix gtt at 4. Continues to diurese. Co-ox 59% CVP 10-13 Creatinine improving.  Echo EF 30-35% RV mildly HK. Feels weak but improving. Appetite improved. Denies SOB.   General:  Elderly male lying in bed . No resp difficulty HEENT: normal Neck: supple. JVP to jaw  Carotids 2+ bilat; no bruits. No lymphadenopathy or thryomegaly appreciated. Cor: PMI nondisplaced. Regular rate & rhythm. No rubs, gallops or murmurs. Lungs: clear anteriorly Abdomen: soft, nontender, nondistended. No hepatosplenomegaly. No bruits or masses. Good bowel sounds. Extremities: no cyanosis, clubbing, rash, 2+ edema Neuro: alert & orientedx3, cranial nerves grossly intact. moves all 4 extremities w/o difficulty. Affect pleasant  AV paced at 90    Remains tenuous but slowly improving. Continue milrinone and NE. Continue to diurese. Still about 15 pounds to go. Do not wean pressors until full diuresed then will wean. Continue to mobilize.   CRITICAL CARE Performed by: Glori Bickers  Total  critical care time: 35 minutes  Critical care time was exclusive of separately billable procedures and treating other patients.  Critical care was necessary to treat or prevent imminent or life-threatening deterioration.  Critical care was time spent personally by me (independent of midlevel providers or residents) on the following activities: development of treatment plan with patient and/or surrogate as well as nursing, discussions with consultants, evaluation of patient's response to treatment, examination of patient, obtaining history from patient or surrogate, ordering and performing treatments and interventions, ordering and review of laboratory studies, ordering and review of radiographic studies, pulse oximetry and re-evaluation of patient's condition.  Glori Bickers, MD  7:00 PM

## 2020-05-29 NOTE — Progress Notes (Signed)
Orthopedic Tech Progress Note Patient Details:  [name] [name] 08-08-1927 [id number]  Ortho Devices Type of Ortho Device: Haematologist Ortho Device/Splint Location: BLE Ortho Device/Splint Interventions: Ordered, Application   Post Interventions Patient Tolerated: Well Instructions Provided: Care of device, Poper ambulation with device   [name] [name] 05/29/2020, 4:03 PM

## 2020-05-30 DIAGNOSIS — I350 Nonrheumatic aortic (valve) stenosis: Secondary | ICD-10-CM | POA: Diagnosis not present

## 2020-05-30 DIAGNOSIS — I5043 Acute on chronic combined systolic (congestive) and diastolic (congestive) heart failure: Secondary | ICD-10-CM | POA: Diagnosis not present

## 2020-05-30 DIAGNOSIS — Z951 Presence of aortocoronary bypass graft: Secondary | ICD-10-CM | POA: Diagnosis not present

## 2020-05-30 LAB — BASIC METABOLIC PANEL
Anion gap: 10 (ref 5–15)
BUN: 29 mg/dL — ABNORMAL HIGH (ref 8–23)
CO2: 30 mmol/L (ref 22–32)
Calcium: 8.7 mg/dL — ABNORMAL LOW (ref 8.9–10.3)
Chloride: 96 mmol/L — ABNORMAL LOW (ref 98–111)
Creatinine, Ser: 1.35 mg/dL — ABNORMAL HIGH (ref 0.61–1.24)
GFR, Estimated: 50 mL/min — ABNORMAL LOW (ref >60)
Glucose, Bld: 144 mg/dL — ABNORMAL HIGH (ref 70–99)
Potassium: 4.2 mmol/L (ref 3.5–5.1)
Sodium: 136 mmol/L (ref 135–145)

## 2020-05-30 LAB — COOXEMETRY PANEL
Carboxyhemoglobin: 1.3 % (ref 0.5–1.5)
Methemoglobin: 1 % (ref 0.0–1.5)
O2 Saturation: 64.2 %
Total hemoglobin: 9 g/dL — ABNORMAL LOW (ref 12.0–16.0)

## 2020-05-30 LAB — MAGNESIUM: Magnesium: 1.9 mg/dL (ref 1.7–2.4)

## 2020-05-30 LAB — GLUCOSE, CAPILLARY
Glucose-Capillary: 143 mg/dL — ABNORMAL HIGH (ref 70–99)
Glucose-Capillary: 175 mg/dL — ABNORMAL HIGH (ref 70–99)
Glucose-Capillary: 141 mg/dL — ABNORMAL HIGH (ref 70–99)
Glucose-Capillary: 112 mg/dL — ABNORMAL HIGH (ref 70–99)

## 2020-05-30 MED ORDER — MAGNESIUM SULFATE 2 GM/50ML IV SOLN
2.0000 g | Freq: Once | INTRAVENOUS | Status: AC
  Administered 2020-05-30: 2 g via INTRAVENOUS
  Filled 2020-05-30: qty 50

## 2020-05-30 MED ORDER — POTASSIUM CHLORIDE CRYS ER 20 MEQ PO TBCR
20.0000 meq | EXTENDED_RELEASE_TABLET | Freq: Two times a day (BID) | ORAL | Status: AC
  Administered 2020-05-30 (×2): 20 meq via ORAL
  Filled 2020-05-30 (×2): qty 1

## 2020-05-30 MED ORDER — ACETAZOLAMIDE 250 MG PO TABS
250.0000 mg | ORAL_TABLET | Freq: Two times a day (BID) | ORAL | Status: DC
  Administered 2020-05-30 – 2020-06-01 (×6): 250 mg via ORAL
  Filled 2020-05-30 (×7): qty 1

## 2020-05-30 MED ORDER — SORBITOL 70 % SOLN
30.0000 mL | Freq: Once | Status: AC
  Administered 2020-05-30: 30 mL via ORAL
  Filled 2020-05-30: qty 30

## 2020-05-30 NOTE — Progress Notes (Signed)
Physical Therapy Treatment Patient Details Name: [name] [name] MRN: [id number] DOB: [birth year] Today's Date: 05/30/2020    History of Present Illness 86 yo male presenting to PCP with worsening LE edema and dyspnea. S/p right/left heart cath and coronary angiography on 11/22. S/p CABG x4 on 11/26. PMH including venous insufficiency with chronic lower extremity edema (mostly on the left), hypertension, hyperlipidemia, peripheral neuropathy, Bil TKA,  essential tremor, and prostate cancer.    PT Comments    Pt seated in recliner on arrival.  He was motivated but continues to fatigue with activity.  Pt progress gt distance.  Continue to recommend snf placement at this time.     Follow Up Recommendations  SNF     Equipment Recommendations  None recommended by PT (has RW at home)    Recommendations for Other Services       Precautions / Restrictions Precautions Precautions: Fall;Sternal Precaution Comments: Reviewed sternal precautions Restrictions Weight Bearing Restrictions: No Other Position/Activity Restrictions: sternal    Mobility  Bed Mobility           Sit to supine: +2 for physical assistance   General bed mobility comments: Pt seated in recliner on arrival this session.  Transfers Overall transfer level: Needs assistance Equipment used: Ambulation equipment used (EVA walker) Transfers: Sit to/from Stand Sit to Stand: Mod assist;+2 safety/equipment         General transfer comment: Pt with strong posterior translation this session.  He continues with his feet out in front but they did not slide this session.  NO c/o dizziness.  Ambulation/Gait Ambulation/Gait assistance: +2 safety/equipment;Mod assist Gait Distance (Feet): 50 Feet Assistive device:  (EVA walker) Gait Pattern/deviations: Step-through pattern;Trunk flexed;Decreased stride length;Leaning posteriorly Gait velocity: slowed   General Gait Details: Pt continues to require mod assistance for  balance.  Pt required close chair follow for safety this session.  Educated on posture and  hip extension.   Stairs             Wheelchair Mobility    Modified Rankin (Stroke Patients Only)       Balance Overall balance assessment: Needs assistance Sitting-balance support: No upper extremity supported;Feet supported Sitting balance-Leahy Scale: Fair Sitting balance - Comments: min guard for safety, sat x 20 min EOB Postural control: Posterior lean   Standing balance-Leahy Scale: Poor Standing balance comment: requires external assist B UE to maintain standing balance                            Cognition Arousal/Alertness: Awake/alert Behavior During Therapy: WFL for tasks assessed/performed Overall Cognitive Status: Within Functional Limits for tasks assessed                                        Exercises Other Exercises Other Exercises: IS x 10 reps 250-500 ml    General Comments        Pertinent Vitals/Pain Pain Assessment: Faces Faces Pain Scale: Hurts little more Pain Location: chest surgical site Pain Descriptors / Indicators: Sore;Aching;Grimacing;Guarding Pain Intervention(s): Monitored during session;Repositioned    Home Living                      Prior Function            PT Goals (current goals can now be found in the care plan section) Acute Rehab PT  Goals Patient Stated Goal: "Get better and go home" Potential to Achieve Goals: Fair Progress towards PT goals: Progressing toward goals    Frequency    Min 3X/week      PT Plan Current plan remains appropriate    Co-evaluation              AM-PAC PT "6 Clicks" Mobility   Outcome Measure  Help needed turning from your back to your side while in a flat bed without using bedrails?: A Little Help needed moving from lying on your back to sitting on the side of a flat bed without using bedrails?: Total Help needed moving to and from a bed to  a chair (including a wheelchair)?: A Lot Help needed standing up from a chair using your arms (e.g., wheelchair or bedside chair)?: A Lot Help needed to walk in hospital room?: A Lot Help needed climbing 3-5 steps with a railing? : A Lot 6 Click Score: 12    End of Session Equipment Utilized During Treatment: Oxygen Activity Tolerance: Patient tolerated treatment well Patient left: in chair;with call bell/phone within reach;with nursing/sitter in room   PT Visit Diagnosis: Muscle weakness (generalized) (M62.81);Difficulty in walking, not elsewhere classified (R26.2);Other abnormalities of gait and mobility (R26.89);Pain Pain - part of body:  (sternum)     Time: 8850-2774 PT Time Calculation (min) (ACUTE ONLY): 25 min  Charges:  $Gait Training: 8-22 mins $Therapeutic Activity: 8-22 mins                     Erasmo Leventhal , PTA Acute Rehabilitation Services Pager [phone] Office [phone]     Janece Laidlaw [name] 05/30/2020, 12:04 PM

## 2020-05-30 NOTE — Progress Notes (Signed)
7 Days Post-Op Procedure(s) (LRB): AORTIC VALVE REPLACEMENT (AVR) USING EDWARDS RESILIA 29 MM AORTIC VALVE. (N/A) CORONARY ARTERY BYPASS GRAFTING (CABG) USING LIMA to LAD; ENDOSCOPIC HARVESTED RIGHT GREATER SAPHENOUS VEIN: SVG to PD; SVG to SEQUENCED INTERMEDIATE to OM (N/A) TRANSESOPHAGEAL ECHOCARDIOGRAM (TEE) (N/A) ENDOVEIN HARVEST OF GREATER SAPHENOUS VEIN (Right) Subjective: No complaints this am  Objective: Vital signs in last 24 hours: Temp:  [96 F (35.6 C)-99.2 F (37.3 C)] 96 F (35.6 C) (12/03 0435) Pulse Rate:  [85-103] 90 (12/03 0500) Cardiac Rhythm: A-V Sequential paced (12/03 0400) Resp:  [12-44] 12 (12/03 0500) BP: (79-130)/(57-94) 119/81 (12/03 0500) SpO2:  [85 %-99 %] 92 % (12/03 0500) Weight:  [84 kg] 84 kg (12/02 0640)  Hemodynamic parameters for last 24 hours: CVP:  [2 mmHg-17 mmHg] 11 mmHg  Intake/Output from previous day: 12/02 0701 - 12/03 0700 In: 274.4 [I.V.:274.4] Out: 2750 [Urine:2750] Intake/Output this shift: Total I/O In: 138 [I.V.:138] Out: 1645 [Urine:1645]  General appearance: alert and cooperative Neurologic: intact Heart: regular rate and rhythm, S1, S2 normal, no murmur, click, rub or gallop Lungs: clear to auscultation bilaterally Extremities: moderate edema Wound: incision healing well  Lab Results: Recent Labs    05/28/20 0723  WBC 7.3  HGB 9.1*  HCT 26.7*  PLT 159   BMET:  Recent Labs    05/29/20 0349 05/30/20 0312  NA 136 136  K 3.5 4.2  CL 96* 96*  CO2 26 30  GLUCOSE 130* 144*  BUN 26* 29*  CREATININE 1.30* 1.35*  CALCIUM 8.8* 8.7*    PT/INR: No results for input(s): LABPROT, INR in the last 72 hours. ABG    Component Value Date/Time   PHART 7.381 05/26/2020 1750   HCO3 22.2 05/26/2020 1750   TCO2 23 05/26/2020 1750   ACIDBASEDEF 2.0 05/26/2020 1750   O2SAT 64.2 05/30/2020 0310   CBG (last 3)  Recent Labs    05/29/20 1252 05/29/20 1541 05/29/20 2204  GLUCAP 195* 158* 172*     Assessment/Plan: S/P Procedure(s) (LRB): AORTIC VALVE REPLACEMENT (AVR) USING EDWARDS RESILIA 29 MM AORTIC VALVE. (N/A) CORONARY ARTERY BYPASS GRAFTING (CABG) USING LIMA to LAD; ENDOSCOPIC HARVESTED RIGHT GREATER SAPHENOUS VEIN: SVG to PD; SVG to SEQUENCED INTERMEDIATE to OM (N/A) TRANSESOPHAGEAL ECHOCARDIOGRAM (TEE) (N/A) ENDOVEIN HARVEST OF GREATER SAPHENOUS VEIN (Right)  POD 7  Hemodynamically stable on NE 3, dop 3, midodrine. Co-ox improved to 64.2.   Rhythm under pacer is sinus with frequent premature beats.  Will continue AV pacing for now to keep rhythm regular. Continue amio 200 bid.  Volume excess: diuresed -2475 cc yesterday on lasix drip and weight continues to decrease. Will continue lasix drip for now.   Continue PT/OT. May need CIR as he recovers.    LOS: 14 days    [name] [name] 05/30/2020

## 2020-05-30 NOTE — Progress Notes (Signed)
T[facility] BRIEF SICU PROGRESS NOTE  7 Days Post-Op  S/P Procedure(s) (LRB): AORTIC VALVE REPLACEMENT (AVR) USING EDWARDS RESILIA 29 MM AORTIC VALVE. (N/A) CORONARY ARTERY BYPASS GRAFTING (CABG) USING LIMA to LAD; ENDOSCOPIC HARVESTED RIGHT GREATER SAPHENOUS VEIN: SVG to PD; SVG to SEQUENCED INTERMEDIATE to OM (N/A) TRANSESOPHAGEAL ECHOCARDIOGRAM (TEE) (N/A) ENDOVEIN HARVEST OF GREATER SAPHENOUS VEIN (Right)   Stable day AV paced w/ stable BP on low dose levophed and dopamine Diuresing well on lasix drip  Plan: Continue current plan  Rexene Alberts, MD 05/30/2020 5:29 PM

## 2020-05-30 NOTE — Progress Notes (Signed)
Advanced Heart Failure Rounding Note  PCP-Cardiologist: Peter Martinique, MD   Subjective:    -2.6L in UOP yesterday on lasix gtt. Wt down only 1 lb. SCr  Stable at 1.35. CVP 13  Remains on Dopamine 3 + NE 4. Co-ox 64%. AV paced 90   Echo 12/1  LVEF 30-35% (unchanged). RV systolic function mildly reduced. No pericardial effusion. No aortic valve prosthesis dysfunction.   Feels weak. Fatigued. No SOB. Says he's bloated. No recent BM    Objective:   Weight Range: 83.8 kg Body mass index is 27.28 kg/m.   Vital Signs:   Temp:  [96 F (35.6 C)-99.2 F (37.3 C)] 97.5 F (36.4 C) (12/03 0727) Pulse Rate:  [86-103] 87 (12/03 0700) Resp:  [12-44] 16 (12/03 0700) BP: (79-130)/(57-94) 119/78 (12/03 0700) SpO2:  [85 %-99 %] 93 % (12/03 0700) Weight:  [83.8 kg] 83.8 kg (12/03 0500) Last BM Date: 05/28/20  Weight change: Filed Weights   05/29/20 0400 05/29/20 0640 05/30/20 0500  Weight: 86.8 kg 84 kg 83.8 kg    Intake/Output:   Intake/Output Summary (Last 24 hours) at 05/30/2020 0759 Last data filed at 05/30/2020 0700 Gross per 24 hour  Intake 397.92 ml  Output 3050 ml  Net -2652.08 ml      Physical Exam    General:  Elderly male sitting up in bed. No resp difficulty HEENT: normal Neck: supple. JVP to jaw  Carotids 2+ bilat; no bruits. No lymphadenopathy or thryomegaly appreciated. Cor: PMI nondisplaced. Regular rate & rhythm. No rubs, gallops or murmurs. Lungs: decreased at bases Abdomen: soft, nontender, +distended. No hepatosplenomegaly. No bruits or masses. Good bowel sounds. Extremities: no cyanosis, clubbing, rash, 1-2+ edema + UNNA Neuro: alert & orientedx3, cranial nerves grossly intact. moves all 4 extremities w/o difficulty. Affect pleasant   Telemetry   A-V paced rhythm 90s Personally reviewed   Labs    CBC Recent Labs    05/28/20 0723  WBC 7.3  HGB 9.1*  HCT 26.7*  MCV 91.8  PLT 616   Basic Metabolic Panel Recent Labs    05/29/20 0349  05/30/20 0312  NA 136 136  K 3.5 4.2  CL 96* 96*  CO2 26 30  GLUCOSE 130* 144*  BUN 26* 29*  CREATININE 1.30* 1.35*  CALCIUM 8.8* 8.7*  MG 2.0 1.9   Liver Function Tests No results for input(s): AST, ALT, ALKPHOS, BILITOT, PROT, ALBUMIN in the last 72 hours. No results for input(s): LIPASE, AMYLASE in the last 72 hours. Cardiac Enzymes No results for input(s): CKTOTAL, CKMB, CKMBINDEX, TROPONINI in the last 72 hours.  BNP: BNP (last 3 results) Recent Labs    11/19/19 1550 05/16/20 1736  BNP 1,139.7* 2,005.2*    ProBNP (last 3 results) Recent Labs    01/02/20 1134  PROBNP 1,167.0*     D-Dimer No results for input(s): DDIMER in the last 72 hours. Hemoglobin A1C No results for input(s): HGBA1C in the last 72 hours. Fasting Lipid Panel No results for input(s): CHOL, HDL, LDLCALC, TRIG, CHOLHDL, LDLDIRECT in the last 72 hours. Thyroid Function Tests No results for input(s): TSH, T4TOTAL, T3FREE, THYROIDAB in the last 72 hours.  Invalid input(s): FREET3  Other results:   Imaging    No results found.   Medications:     Scheduled Medications: . amiodarone  200 mg Oral BID  . aspirin EC  81 mg Oral Daily  . bisacodyl  10 mg Oral Daily   Or  . bisacodyl  10 mg Rectal Daily  . Chlorhexidine Gluconate Cloth  6 each Topical Daily  . docusate sodium  200 mg Oral Daily  . feeding supplement  237 mL Oral BID BM  . ferrous [id number] C-folic acid  1 capsule Oral BID PC  . insulin aspart  0-15 Units Subcutaneous TID WC  . mouth rinse  15 mL Mouth Rinse BID  . midodrine  10 mg Oral TID  . multivitamin with minerals  1 tablet Oral Daily  . pantoprazole  40 mg Oral Daily  . potassium chloride  20 mEq Oral BID  . sodium chloride flush  10-40 mL Intracatheter Q12H    Infusions: . DOPamine 3 mcg/kg/min (05/30/20 0700)  . furosemide (LASIX) 200 mg in dextrose 5% 100 mL (2mg /mL) infusion 8 mg/hr (05/30/20 0700)  . norepinephrine (LEVOPHED) Adult  infusion 3 mcg/min (05/30/20 0700)    PRN Medications: melatonin, ondansetron (ZOFRAN) IV, oxyCODONE, sodium chloride flush, traMADol   Assessment/Plan   1. Severe 3V CAD: - s/p CABGx 4 (LIMA-LAD, sequential SVG-OM1 and OM2, SVG- PDA) - no s/s ischemia  - continue ASA 81 - no ? blocker w/ post-cardiotomy shock and low BP - will add atorva 40  2. Aortic Stenosis: - severe low flow low gradient AS - s/p tissue AVR 11/26  - AV prosthesis ok on post op echo - stable   3. Acute on Chronic Biventricular Heart Failure:  - Echo in 2014 showed normal LVEF 55-60% w/ G1DD.  - Echo 10/21 and EF 30-35% w/ global HK. RV systolic function mildly reduced. - LHC this admit w/ severe 3V CAD>>ICM. Now s/p CABG. Also w/ severe AS, possibly contributing to LV dysfunction, now s/p SAVR - post op recovery c/b difficulties weaning pressors/inotropes. On NE 4 + Dopamine 3 + midodrine 10 mg tid. Co-ox 64% - Post-op echo 12/1 EF 30-35%, RV mildly reduced. AV prothesis ok  - responding well to lasix gtt but remains fluid overloaded ~16 pounds over baseline. Bicarb rising - continue lasix gtt at 4 mg/hr. Add diamox 250 bid with rising bicarb - Would not down titrate pressors/inotropes until fluid is removed - Continue TED  - hypotension limits use of ARNi/ARB and spiro   4. Post Operative Atrial Fibrillation:   - continues w/ temp pacing wires, currently A-V paced - underlying rhythm eiter AF or sinus with PACs in 60-70s. Will get ECG with pacing off - continue AV pacing for now as he is tolerating well - continue PO amiodarone 400 mg bid  - defer a/c to CT surgery    5. AKI:   - Baseline <1.0. Peaked at 1.44 this admit  - stable w/ diuresis, 1.35 today  - continue inotropes/ pressors and keep SBP >100  - diuresis per above  6. Hyponatremia - stable 136 today - follow  7. Anemia - expected ABLA from surgery  - Hgb stable  - on PO Fe   8. Debility - aggressive PT. Ok to get out of  bed and walk.  - will ask PT to give him exercise bands - I instructed him on the use of incentive spirometer personally   9. Constipation - sorbitol today  CRITICAL CARE Performed by: Glori Bickers  Total critical care time: 35 minutes  Critical care time was exclusive of separately billable procedures and treating other patients.  Critical care was necessary to treat or prevent imminent or life-threatening deterioration.  Critical care was time spent personally by me (independent of midlevel providers or residents) on  the following activities: development of treatment plan with patient and/or surrogate as well as nursing, discussions with consultants, evaluation of patient's response to treatment, examination of patient, obtaining history from patient or surrogate, ordering and performing treatments and interventions, ordering and review of laboratory studies, ordering and review of radiographic studies, pulse oximetry and re-evaluation of patient's condition.   Length of Stay: [place], MD  05/30/2020, 7:59 AM  Advanced Heart Failure Team Pager [phone] (M-F; [place])  Please contact [facility] for night-coverage after hours (4p -7a ) and weekends on [email or web address]

## 2020-05-31 DIAGNOSIS — Z951 Presence of aortocoronary bypass graft: Secondary | ICD-10-CM | POA: Diagnosis not present

## 2020-05-31 DIAGNOSIS — I350 Nonrheumatic aortic (valve) stenosis: Secondary | ICD-10-CM | POA: Diagnosis not present

## 2020-05-31 DIAGNOSIS — I5043 Acute on chronic combined systolic (congestive) and diastolic (congestive) heart failure: Secondary | ICD-10-CM | POA: Diagnosis not present

## 2020-05-31 LAB — CBC
HCT: 28.9 % — ABNORMAL LOW (ref 39.0–52.0)
Hemoglobin: 9.4 g/dL — ABNORMAL LOW (ref 13.0–17.0)
MCH: 31.6 pg (ref 26.0–34.0)
MCHC: 32.5 g/dL (ref 30.0–36.0)
MCV: 97.3 fL (ref 80.0–100.0)
Platelets: 252 10*3/uL (ref 150–400)
RBC: 2.97 MIL/uL — ABNORMAL LOW (ref 4.22–5.81)
RDW: 15.4 % (ref 11.5–15.5)
WBC: 9.9 10*3/uL (ref 4.0–10.5)
nRBC: 0 % (ref 0.0–0.2)

## 2020-05-31 LAB — COOXEMETRY PANEL
Carboxyhemoglobin: 1.2 % (ref 0.5–1.5)
Methemoglobin: 1 % (ref 0.0–1.5)
O2 Saturation: 70.4 %
Total hemoglobin: 9.5 g/dL — ABNORMAL LOW (ref 12.0–16.0)

## 2020-05-31 LAB — BASIC METABOLIC PANEL
Anion gap: 10 (ref 5–15)
BUN: 25 mg/dL — ABNORMAL HIGH (ref 8–23)
CO2: 32 mmol/L (ref 22–32)
Calcium: 8.5 mg/dL — ABNORMAL LOW (ref 8.9–10.3)
Chloride: 95 mmol/L — ABNORMAL LOW (ref 98–111)
Creatinine, Ser: 1.33 mg/dL — ABNORMAL HIGH (ref 0.61–1.24)
GFR, Estimated: 51 mL/min — ABNORMAL LOW (ref >60)
Glucose, Bld: 172 mg/dL — ABNORMAL HIGH (ref 70–99)
Potassium: 3.4 mmol/L — ABNORMAL LOW (ref 3.5–5.1)
Sodium: 137 mmol/L (ref 135–145)

## 2020-05-31 LAB — GLUCOSE, CAPILLARY
Glucose-Capillary: 129 mg/dL — ABNORMAL HIGH (ref 70–99)
Glucose-Capillary: 138 mg/dL — ABNORMAL HIGH (ref 70–99)
Glucose-Capillary: 150 mg/dL — ABNORMAL HIGH (ref 70–99)

## 2020-05-31 MED ORDER — POTASSIUM CHLORIDE 10 MEQ/50ML IV SOLN
10.0000 meq | INTRAVENOUS | Status: AC
  Administered 2020-05-31 (×3): 10 meq via INTRAVENOUS
  Filled 2020-05-31 (×3): qty 50

## 2020-05-31 MED ORDER — POTASSIUM CHLORIDE CRYS ER 20 MEQ PO TBCR
40.0000 meq | EXTENDED_RELEASE_TABLET | Freq: Two times a day (BID) | ORAL | Status: DC
  Administered 2020-05-31 – 2020-06-01 (×4): 40 meq via ORAL
  Filled 2020-05-31 (×4): qty 2

## 2020-05-31 NOTE — Progress Notes (Signed)
Advanced Heart Failure Rounding Note  PCP-Cardiologist: Peter Martinique, MD   Subjective:    -2.3L in UOP yesterday on lasix gtt at 8. Wt down 5 lb. SCr  Stable at 1.33. CVP 12  Remains on Dopamine 3 + NE 2. Co-ox 70%. AV paced 90   Echo 12/1  LVEF 30-35% (unchanged). RV systolic function mildly reduced. No pericardial effusion. No aortic valve prosthesis dysfunction.   Still feels weak and bloated. Mildly SOB. No CP. Had BM yesterday  Objective:   Weight Range: 81.2 kg Body mass index is 26.44 kg/m.   Vital Signs:   Temp:  [97.1 F (36.2 C)-97.5 F (36.4 C)] 97.1 F (36.2 C) (12/04 0700) Pulse Rate:  [73-96] 90 (12/04 0700) Resp:  [15-30] 20 (12/04 0700) BP: (86-130)/(60-79) 90/63 (12/04 0600) SpO2:  [90 %-99 %] 95 % (12/04 0700) Weight:  [81.2 kg] 81.2 kg (12/04 0600) Last BM Date: 05/30/20  Weight change: Filed Weights   05/29/20 0640 05/30/20 0500 05/31/20 0600  Weight: 84 kg 83.8 kg 81.2 kg    Intake/Output:   Intake/Output Summary (Last 24 hours) at 05/31/2020 0903 Last data filed at 05/31/2020 0700 Gross per 24 hour  Intake 1540.57 ml  Output 3250 ml  Net -1709.43 ml      Physical Exam    General:  Elderly male sitting up in chair. No resp difficulty HEENT: normal Neck: supple. JVP 12. Carotids 2+ bilat; no bruits. No lymphadenopathy or thryomegaly appreciated. Cor: PMI nondisplaced. Regular rate & rhythm. No rubs, gallops or murmurs. Lungs: clear Abdomen: soft, nontender, + mildly distended. No hepatosplenomegaly. No bruits or masses. Good bowel sounds. Extremities: no cyanosis, clubbing, rash, 1+ edema + UNNA Neuro: alert & orientedx3, cranial nerves grossly intact. moves all 4 extremities w/o difficulty. Affect pleasant    Telemetry   A-V paced rhythm 90s (Underneath sinus 80s with long 1AVB) Personally reviewed   Labs    CBC Recent Labs    05/31/20 0344  WBC 9.9  HGB 9.4*  HCT 28.9*  MCV 97.3  PLT 144   Basic Metabolic  Panel Recent Labs    05/29/20 0349 05/29/20 0349 05/30/20 0312 05/31/20 0344  NA 136   < > 136 137  K 3.5   < > 4.2 3.4*  CL 96*   < > 96* 95*  CO2 26   < > 30 32  GLUCOSE 130*   < > 144* 172*  BUN 26*   < > 29* 25*  CREATININE 1.30*   < > 1.35* 1.33*  CALCIUM 8.8*   < > 8.7* 8.5*  MG 2.0  --  1.9  --    < > = values in this interval not displayed.   Liver Function Tests No results for input(s): AST, ALT, ALKPHOS, BILITOT, PROT, ALBUMIN in the last 72 hours. No results for input(s): LIPASE, AMYLASE in the last 72 hours. Cardiac Enzymes No results for input(s): CKTOTAL, CKMB, CKMBINDEX, TROPONINI in the last 72 hours.  BNP: BNP (last 3 results) Recent Labs    11/19/19 1550 05/16/20 1736  BNP 1,139.7* 2,005.2*    ProBNP (last 3 results) Recent Labs    01/02/20 1134  PROBNP 1,167.0*     D-Dimer No results for input(s): DDIMER in the last 72 hours. Hemoglobin A1C No results for input(s): HGBA1C in the last 72 hours. Fasting Lipid Panel No results for input(s): CHOL, HDL, LDLCALC, TRIG, CHOLHDL, LDLDIRECT in the last 72 hours. Thyroid Function Tests No results for  input(s): TSH, T4TOTAL, T3FREE, THYROIDAB in the last 72 hours.  Invalid input(s): FREET3  Other results:   Imaging    No results found.   Medications:     Scheduled Medications: . acetaZOLAMIDE  250 mg Oral BID  . amiodarone  200 mg Oral BID  . aspirin EC  81 mg Oral Daily  . bisacodyl  10 mg Oral Daily   Or  . bisacodyl  10 mg Rectal Daily  . Chlorhexidine Gluconate Cloth  6 each Topical Daily  . docusate sodium  200 mg Oral Daily  . feeding supplement  237 mL Oral BID BM  . ferrous [id number] C-folic acid  1 capsule Oral BID PC  . insulin aspart  0-15 Units Subcutaneous TID WC  . mouth rinse  15 mL Mouth Rinse BID  . midodrine  10 mg Oral TID  . multivitamin with minerals  1 tablet Oral Daily  . pantoprazole  40 mg Oral Daily  . sodium chloride flush  10-40 mL  Intracatheter Q12H    Infusions: . DOPamine 3 mcg/kg/min (05/31/20 0700)  . furosemide (LASIX) 200 mg in dextrose 5% 100 mL (2mg /mL) infusion 8 mg/hr (05/31/20 0700)  . norepinephrine (LEVOPHED) Adult infusion 2 mcg/min (05/31/20 0700)    PRN Medications: melatonin, ondansetron (ZOFRAN) IV, oxyCODONE, sodium chloride flush, traMADol   Assessment/Plan   1. Severe 3V CAD: - s/p CABGx 4 (LIMA-LAD, sequential SVG-OM1 and OM2, SVG- PDA) - no s/s ischemia - continue ASA 81 - no ? blocker w/ post-cardiotomy shock and low BP - continue atorva 40  2. Aortic Stenosis: - severe low flow low gradient AS - s/p tissue AVR 11/26  - AV prosthesis ok on post op echo - stable  3. Acute on Chronic Biventricular Heart Failure:  - Echo in 2014 showed normal LVEF 55-60% w/ G1DD.  - Echo 10/21 and EF 30-35% w/ global HK. RV systolic function mildly reduced. - LHC this admit w/ severe 3V CAD>>ICM. Now s/p CABG. Also w/ severe AS, possibly contributing to LV dysfunction, now s/p SAVR - post op recovery c/b difficulties weaning pressors/inotropes. On NE 4 + Dopamine 3 + midodrine 10 mg tid. Co-ox 64% - Post-op echo 12/1 EF 30-35%, RV mildly reduced. AV prothesis ok  - responding well to lasix gtt but remains fluid overloaded ~11 pounds over baseline.  - I turned lasix gtt up to 10. Continue diamox - Would not down titrate pressors/inotropes until fluid is removed - Continue UNNA - hypotension limits use of ARNi/ARB and spiro - supp K. Add spiro soon    4. Post Operative Atrial Fibrillation:   - continues w/ temp pacing wires, currently A-V paced - underlying rhythm sinus with long 1AVB in 70-80s - continue AV pacing for now as he is tolerating well - continue PO amiodarone 200 mg bid  - defer a/c to CT surgery    5. AKI:   - Baseline <1.0. Peaked at 1.44 this admit  - stable w/ diuresis, 1.32 today  - continue inotropes/ pressors and keep SBP >100  - diuresis per above  6.  Hyponatremia - stable 137 today - follow  7. Anemia - expected ABLA from surgery  - Hgb stable  - on PO Fe   8. Debility - aggressive PT. Ok to get out of bed and walk.  - will ask PT to give him exercise bands - I instructed him on the use of incentive spirometer personally  - Wlil walk this am   9.  Constipation - improved with sorbitol  CRITICAL CARE Performed by: Glori Bickers  Total critical care time: 35 minutes  Critical care time was exclusive of separately billable procedures and treating other patients.  Critical care was necessary to treat or prevent imminent or life-threatening deterioration.  Critical care was time spent personally by me (independent of midlevel providers or residents) on the following activities: development of treatment plan with patient and/or surrogate as well as nursing, discussions with consultants, evaluation of patient's response to treatment, examination of patient, obtaining history from patient or surrogate, ordering and performing treatments and interventions, ordering and review of laboratory studies, ordering and review of radiographic studies, pulse oximetry and re-evaluation of patient's condition.   Length of Stay: [place], MD  05/31/2020, 9:03 AM  Advanced Heart Failure Team Pager [phone] (M-F; 7a - 4p)  Please contact [facility] for night-coverage after hours (4p -7a ) and weekends on [email or web address]

## 2020-05-31 NOTE — Progress Notes (Signed)
TCTS BRIEF SICU PROGRESS NOTE  8 Days Post-Op  S/P Procedure(s) (LRB): AORTIC VALVE REPLACEMENT (AVR) USING EDWARDS RESILIA 29 MM AORTIC VALVE. (N/A) CORONARY ARTERY BYPASS GRAFTING (CABG) USING LIMA to LAD; ENDOSCOPIC HARVESTED RIGHT GREATER SAPHENOUS VEIN: SVG to PD; SVG to SEQUENCED INTERMEDIATE to OM (N/A) TRANSESOPHAGEAL ECHOCARDIOGRAM (TEE) (N/A) ENDOVEIN HARVEST OF GREATER SAPHENOUS VEIN (Right)   Stable day  Plan: Continue current plan  Rexene Alberts, MD 05/31/2020 6:03 PM

## 2020-05-31 NOTE — Progress Notes (Signed)
      [address]       [place] [address]             [phone]        CARDIOTHORACIC SURGERY PROGRESS NOTE   R8 Days Post-Op Procedure(s) (LRB): AORTIC VALVE REPLACEMENT (AVR) USING EDWARDS RESILIA 29 MM AORTIC VALVE. (N/A) CORONARY ARTERY BYPASS GRAFTING (CABG) USING LIMA to LAD; ENDOSCOPIC HARVESTED RIGHT GREATER SAPHENOUS VEIN: SVG to PD; SVG to SEQUENCED INTERMEDIATE to OM (N/A) TRANSESOPHAGEAL ECHOCARDIOGRAM (TEE) (N/A) ENDOVEIN HARVEST OF GREATER SAPHENOUS VEIN (Right)  Subjective: Feels okay.  + BM overnight.  Appetite okay.  Ambulated short distance w/ assistance  Objective: Vital signs: BP Readings from Last 1 Encounters:  05/31/20 90/63   Pulse Readings from Last 1 Encounters:  05/31/20 90   Resp Readings from Last 1 Encounters:  05/31/20 20   Temp Readings from Last 1 Encounters:  05/31/20 (!) 97.1 F (36.2 C) (Axillary)    Hemodynamics: CVP:  [13 mmHg-14 mmHg] (P) 14 mmHg  Mixed venous co-ox 70%   Physical Exam:  Rhythm:   AV paced  Breath sounds: clear  Heart sounds:  RRR  Incisions:  Clean and dry  Abdomen:  Soft, non-distended, non-tender  Extremities:  Warm, well-perfused    Intake/Output from previous day: 12/03 0701 - 12/04 0700 In: 1804.1 [P.O.:1440; I.V.:258.4; IV Piggyback:105.7] Out: 4050 [Urine:4050] Intake/Output this shift: No intake/output data recorded.  Lab Results:  CBC: Recent Labs    05/31/20 0344  WBC 9.9  HGB 9.4*  HCT 28.9*  PLT 252    BMET:  Recent Labs    05/30/20 0312 05/31/20 0344  NA 136 137  K 4.2 3.4*  CL 96* 95*  CO2 30 32  GLUCOSE 144* 172*  BUN 29* 25*  CREATININE 1.35* 1.33*  CALCIUM 8.7* 8.5*     PT/INR:  No results for input(s): LABPROT, INR in the last 72 hours.  CBG (last 3)  Recent Labs    05/30/20 1519 05/30/20 2213 05/31/20 0628  GLUCAP 141* 112* 129*    ABG    Component Value Date/Time   PHART 7.381 05/26/2020 1750   PCO2ART 37.8 05/26/2020 1750    PO2ART 82 (L) 05/26/2020 1750   HCO3 22.2 05/26/2020 1750   TCO2 23 05/26/2020 1750   ACIDBASEDEF 2.0 05/26/2020 1750   O2SAT 70.4 05/31/2020 0344    CXR: n/a  Assessment/Plan: S/P Procedure(s) (LRB): AORTIC VALVE REPLACEMENT (AVR) USING EDWARDS RESILIA 29 MM AORTIC VALVE. (N/A) CORONARY ARTERY BYPASS GRAFTING (CABG) USING LIMA to LAD; ENDOSCOPIC HARVESTED RIGHT GREATER SAPHENOUS VEIN: SVG to PD; SVG to SEQUENCED INTERMEDIATE to OM (N/A) TRANSESOPHAGEAL ECHOCARDIOGRAM (TEE) (N/A) ENDOVEIN HARVEST OF GREATER SAPHENOUS VEIN (Right)  Clinically stable Maintaining AV paced rhythm w/ stable BP on low dose levophed and dopamine, midodrine Breathing comfortably w/ O2 sats 95-98% on nasal cannula Diuresing well on lasix drip, weight down 5 lbs and renal function stable Remains weak, significant deconditioning   Continue current plan  Rexene Alberts, MD 05/31/2020 10:21 AM

## 2020-06-01 DIAGNOSIS — I5043 Acute on chronic combined systolic (congestive) and diastolic (congestive) heart failure: Secondary | ICD-10-CM | POA: Diagnosis not present

## 2020-06-01 DIAGNOSIS — Z951 Presence of aortocoronary bypass graft: Secondary | ICD-10-CM | POA: Diagnosis not present

## 2020-06-01 DIAGNOSIS — I350 Nonrheumatic aortic (valve) stenosis: Secondary | ICD-10-CM | POA: Diagnosis not present

## 2020-06-01 LAB — BASIC METABOLIC PANEL
Anion gap: 10 (ref 5–15)
BUN: 27 mg/dL — ABNORMAL HIGH (ref 8–23)
CO2: 33 mmol/L — ABNORMAL HIGH (ref 22–32)
Calcium: 9 mg/dL (ref 8.9–10.3)
Chloride: 96 mmol/L — ABNORMAL LOW (ref 98–111)
Creatinine, Ser: 1.45 mg/dL — ABNORMAL HIGH (ref 0.61–1.24)
GFR, Estimated: 46 mL/min — ABNORMAL LOW (ref >60)
Glucose, Bld: 141 mg/dL — ABNORMAL HIGH (ref 70–99)
Potassium: 4 mmol/L (ref 3.5–5.1)
Sodium: 139 mmol/L (ref 135–145)

## 2020-06-01 LAB — GLUCOSE, CAPILLARY
Glucose-Capillary: 140 mg/dL — ABNORMAL HIGH (ref 70–99)
Glucose-Capillary: 124 mg/dL — ABNORMAL HIGH (ref 70–99)
Glucose-Capillary: 131 mg/dL — ABNORMAL HIGH (ref 70–99)
Glucose-Capillary: 256 mg/dL — ABNORMAL HIGH (ref 70–99)

## 2020-06-01 LAB — COOXEMETRY PANEL
Carboxyhemoglobin: 1.3 % (ref 0.5–1.5)
Methemoglobin: 0.9 % (ref 0.0–1.5)
O2 Saturation: 71.9 %
Total hemoglobin: 9.4 g/dL — ABNORMAL LOW (ref 12.0–16.0)

## 2020-06-01 MED ORDER — TORSEMIDE 20 MG PO TABS: 40.0000 mg | ORAL_TABLET | Freq: Every day | ORAL | Status: DC

## 2020-06-01 NOTE — Progress Notes (Signed)
Advanced Heart Failure Rounding Note  PCP-Cardiologist: Peter Martinique, MD   Subjective:    Remains on Dopamine 3 + NE 2. Co-ox 72%. AV paced 90. Lasix gtt turned up to 10/hr yesterday. Weight down 6 more pounds. 24 pounds total in last 5 days. Creatinine up 1.3 -> 1.4  CVP 6  Still feels weak. Denies CP ,orthopnea or PND. Walked the unit with RN yesterday,    Echo 12/1  LVEF 30-35% (unchanged). RV systolic function mildly reduced. No pericardial effusion. No aortic valve prosthesis dysfunction.    Objective:   Weight Range: 78.8 kg Body mass index is 25.65 kg/m.   Vital Signs:   Temp:  [97.6 F (36.4 C)-98.9 F (37.2 C)] 97.6 F (36.4 C) (12/05 0646) Pulse Rate:  [85-97] 87 (12/05 0800) Resp:  [15-25] 21 (12/05 0800) BP: (89-107)/(61-75) 94/66 (12/05 0800) SpO2:  [90 %-100 %] 90 % (12/05 0800) Weight:  [78.8 kg] 78.8 kg (12/05 0500) Last BM Date: 05/30/20  Weight change: Filed Weights   05/30/20 0500 05/31/20 0600 06/01/20 0500  Weight: 83.8 kg 81.2 kg 78.8 kg    Intake/Output:   Intake/Output Summary (Last 24 hours) at 06/01/2020 0826 Last data filed at 06/01/2020 0800 Gross per 24 hour  Intake 1652.18 ml  Output 2745 ml  Net -1092.82 ml      Physical Exam    General:  Elderly male sitting up in chair. No resp difficulty HEENT: normal Neck: supple. no JVD. Carotids 2+ bilat; no bruits. No lymphadenopathy or thryomegaly appreciated. Cor: PMI nondisplaced. Sternal dressing ok Regular rate & rhythm. No rubs, gallops or murmurs. Lungs: clear Abdomen: soft, nontender, nondistended. No hepatosplenomegaly. No bruits or masses. Good bowel sounds. Extremities: no cyanosis, clubbing, rash, tr-1+ edema + UNNA Neuro: alert & orientedx3, cranial nerves grossly intact. moves all 4 extremities w/o difficulty. Affect pleasant   Telemetry   A-V paced rhythm 90s (Underneath sinus 80s with long 1AVB) Personally reviewed  Labs    CBC Recent Labs    05/31/20 0344   WBC 9.9  HGB 9.4*  HCT 28.9*  MCV 97.3  PLT 403   Basic Metabolic Panel Recent Labs    05/30/20 0312 05/30/20 0312 05/31/20 0344 06/01/20 0343  NA 136   < > 137 139  K 4.2   < > 3.4* 4.0  CL 96*   < > 95* 96*  CO2 30   < > 32 33*  GLUCOSE 144*   < > 172* 141*  BUN 29*   < > 25* 27*  CREATININE 1.35*   < > 1.33* 1.45*  CALCIUM 8.7*   < > 8.5* 9.0  MG 1.9  --   --   --    < > = values in this interval not displayed.   Liver Function Tests No results for input(s): AST, ALT, ALKPHOS, BILITOT, PROT, ALBUMIN in the last 72 hours. No results for input(s): LIPASE, AMYLASE in the last 72 hours. Cardiac Enzymes No results for input(s): CKTOTAL, CKMB, CKMBINDEX, TROPONINI in the last 72 hours.  BNP: BNP (last 3 results) Recent Labs    11/19/19 1550 05/16/20 1736  BNP 1,139.7* 2,005.2*    ProBNP (last 3 results) Recent Labs    01/02/20 1134  PROBNP 1,167.0*     D-Dimer No results for input(s): DDIMER in the last 72 hours. Hemoglobin A1C No results for input(s): HGBA1C in the last 72 hours. Fasting Lipid Panel No results for input(s): CHOL, HDL, LDLCALC, TRIG, CHOLHDL, LDLDIRECT  in the last 72 hours. Thyroid Function Tests No results for input(s): TSH, T4TOTAL, T3FREE, THYROIDAB in the last 72 hours.  Invalid input(s): FREET3  Other results:   Imaging    No results found.   Medications:     Scheduled Medications: . acetaZOLAMIDE  250 mg Oral BID  . amiodarone  200 mg Oral BID  . aspirin EC  81 mg Oral Daily  . bisacodyl  10 mg Oral Daily   Or  . bisacodyl  10 mg Rectal Daily  . Chlorhexidine Gluconate Cloth  6 each Topical Daily  . docusate sodium  200 mg Oral Daily  . feeding supplement  237 mL Oral BID BM  . ferrous [id number] C-folic acid  1 capsule Oral BID PC  . insulin aspart  0-15 Units Subcutaneous TID WC  . mouth rinse  15 mL Mouth Rinse BID  . midodrine  10 mg Oral TID  . multivitamin with minerals  1 tablet Oral Daily  .  pantoprazole  40 mg Oral Daily  . potassium chloride  40 mEq Oral BID  . sodium chloride flush  10-40 mL Intracatheter Q12H    Infusions: . DOPamine 3 mcg/kg/min (06/01/20 0800)  . furosemide (LASIX) 200 mg in dextrose 5% 100 mL (2mg /mL) infusion 10 mg/hr (06/01/20 0800)  . norepinephrine (LEVOPHED) Adult infusion 2 mcg/min (06/01/20 0800)    PRN Medications: melatonin, ondansetron (ZOFRAN) IV, oxyCODONE, sodium chloride flush, traMADol   Assessment/Plan   1. Severe 3V CAD: - s/p CABGx 4 (LIMA-LAD, sequential SVG-OM1 and OM2, SVG- PDA) - no s/s ischemia - continue ASA 81 - no ? blocker w/ post-cardiotomy shock and low BP - start atorva 40  2. Aortic Stenosis: - severe low flow low gradient AS - s/p tissue AVR 11/26  - AV prosthesis ok on post op echo - stable  3. Acute on Chronic Biventricular Heart Failure:  - Echo in 2014 showed normal LVEF 55-60% w/ G1DD.  - Echo 10/21 and EF 30-35% w/ global HK. RV systolic function mildly reduced. - LHC this admit w/ severe 3V CAD>>ICM. Now s/p CABG. Also w/ severe AS, possibly contributing to LV dysfunction, now s/p SAVR - post op recovery c/b difficulties weaning pressors/inotropes. On NE 2 + Dopamine 3 + midodrine 10 mg tid. Co-ox 72% this am. CVP down to 6 - Post-op echo 12/1 EF 30-35%, RV mildly reduced. AV prothesis ok  - Will stop IV diuretics today. Start torsemide 40 daily.  - Begin to wean pressors tomorrow - Continue UNNA - hypotension limits use of ARNi/ARB and spiro - Add spiro soon    4. Post Operative Atrial Fibrillation:   - continues w/ temp pacing wires, currently A-V paced - underlying rhythm sinus with long 1AVB in 70-80s - switched to A-pacing this am - continue PO amiodarone 200 mg bid  - defer a/c to CT surgery    5. AKI:   - Baseline <1.0. Peaked at 1.44 this admit  - stable w/ diuresis, 1.32 today  - continue inotropes/ pressors and keep SBP >100  - diuresis per above  6. Hyponatremia -  resolved   7. Anemia - expected ABLA from surgery  - Hgb stable  - on PO Fe   8. Debility - aggressive PT. Ok to get out of bed and walk.  - will ask PT to give him exercise bands - I instructed him on the use of incentive spirometer personally  - continue to mobilize as much as possible  9.  Constipation - improved with sorbitol  CRITICAL CARE Performed by: Glori Bickers  Total critical care time: 35 minutes  Critical care time was exclusive of separately billable procedures and treating other patients.  Critical care was necessary to treat or prevent imminent or life-threatening deterioration.  Critical care was time spent personally by me (independent of midlevel providers or residents) on the following activities: development of treatment plan with patient and/or surrogate as well as nursing, discussions with consultants, evaluation of patient's response to treatment, examination of patient, obtaining history from patient or surrogate, ordering and performing treatments and interventions, ordering and review of laboratory studies, ordering and review of radiographic studies, pulse oximetry and re-evaluation of patient's condition.   Length of Stay: [place], MD  06/01/2020, 8:26 AM  Advanced Heart Failure Team Pager [phone] (M-F; 7a - 4p)  Please contact [facility] for night-coverage after hours (4p -7a ) and weekends on [email or web address]

## 2020-06-01 NOTE — Progress Notes (Signed)
TCTS BRIEF SICU PROGRESS NOTE  9 Days Post-Op  S/P Procedure(s) (LRB): AORTIC VALVE REPLACEMENT (AVR) USING EDWARDS RESILIA 29 MM AORTIC VALVE. (N/A) CORONARY ARTERY BYPASS GRAFTING (CABG) USING LIMA to LAD; ENDOSCOPIC HARVESTED RIGHT GREATER SAPHENOUS VEIN: SVG to PD; SVG to SEQUENCED INTERMEDIATE to OM (N/A) TRANSESOPHAGEAL ECHOCARDIOGRAM (TEE) (N/A) ENDOVEIN HARVEST OF GREATER SAPHENOUS VEIN (Right)   Stable day  Plan: Continue current plan  Rexene Alberts, MD 06/01/2020 5:14 PM

## 2020-06-01 NOTE — Progress Notes (Signed)
      [address]       [name] [address]             [phone]        CARDIOTHORACIC SURGERY PROGRESS NOTE   R9 Days Post-Op Procedure(s) (LRB): AORTIC VALVE REPLACEMENT (AVR) USING EDWARDS RESILIA 29 MM AORTIC VALVE. (N/A) CORONARY ARTERY BYPASS GRAFTING (CABG) USING LIMA to LAD; ENDOSCOPIC HARVESTED RIGHT GREATER SAPHENOUS VEIN: SVG to PD; SVG to SEQUENCED INTERMEDIATE to OM (N/A) TRANSESOPHAGEAL ECHOCARDIOGRAM (TEE) (N/A) ENDOVEIN HARVEST OF GREATER SAPHENOUS VEIN (Right)  Subjective: Seems more lethargic today.  No specific complaints.  Poor appetite.  Denies pain, SOB  Objective: Vital signs: BP Readings from Last 1 Encounters:  06/01/20 94/66   Pulse Readings from Last 1 Encounters:  06/01/20 87   Resp Readings from Last 1 Encounters:  06/01/20 (!) 21   Temp Readings from Last 1 Encounters:  06/01/20 97.6 F (36.4 C) (Oral)    Hemodynamics: CVP:  [11 mmHg-13 mmHg] 11 mmHg  Mixed venous co-ox 72%   Physical Exam:  Rhythm:   AV paced - sinus underneath w/ occasional PVC's and pauses  Breath sounds: Diminished at bases  Heart sounds:  RRR  Incisions:  Clean and dry  Abdomen:  Soft, non-distended, non-tender  Extremities:  Warm, well-perfused    Intake/Output from previous day: 12/04 0701 - 12/05 0700 In: 1578.5 [P.O.:1200; I.V.:281.2; IV Piggyback:97.3] Out: 2745 [Urine:2745] Intake/Output this shift: Total I/O In: 131.8 [P.O.:120; I.V.:11.8] Out: 350 [Urine:350]  Lab Results:  CBC: Recent Labs    05/31/20 0344  WBC 9.9  HGB 9.4*  HCT 28.9*  PLT 252    BMET:  Recent Labs    05/31/20 0344 06/01/20 0343  NA 137 139  K 3.4* 4.0  CL 95* 96*  CO2 32 33*  GLUCOSE 172* 141*  BUN 25* 27*  CREATININE 1.33* 1.45*  CALCIUM 8.5* 9.0     PT/INR:  No results for input(s): LABPROT, INR in the last 72 hours.  CBG (last 3)  Recent Labs    05/31/20 1133 05/31/20 2115 06/01/20 0645  GLUCAP 138* 150* 140*    ABG     Component Value Date/Time   PHART 7.381 05/26/2020 1750   PCO2ART 37.8 05/26/2020 1750   PO2ART 82 (L) 05/26/2020 1750   HCO3 22.2 05/26/2020 1750   TCO2 23 05/26/2020 1750   ACIDBASEDEF 2.0 05/26/2020 1750   O2SAT 71.9 06/01/2020 0343    CXR: n/a  Assessment/Plan: S/P Procedure(s) (LRB): AORTIC VALVE REPLACEMENT (AVR) USING EDWARDS RESILIA 29 MM AORTIC VALVE. (N/A) CORONARY ARTERY BYPASS GRAFTING (CABG) USING LIMA to LAD; ENDOSCOPIC HARVESTED RIGHT GREATER SAPHENOUS VEIN: SVG to PD; SVG to SEQUENCED INTERMEDIATE to OM (N/A) TRANSESOPHAGEAL ECHOCARDIOGRAM (TEE) (N/A) ENDOVEIN HARVEST OF GREATER SAPHENOUS VEIN (Right)  Overall stable but not progressing well NSR w/ PVC's, BP stable and mixed venous co-ox 72% but still on dopamine and levophed drips with midodrine for BP support Acute on chronic systolic CHF with expected post-op volume excess, diuresing well and CVP 11, I/O's negative > 11 liters last 5 days and weight approaching preop baseline Severe deconditioning and generalized weakness Poor appetite   Will try AAI pacing  Wean levophed and dopamine as tolerated  Consider backing off on lasix  Mobilize as much as possible  Check nutrition labs and consult dietician for protein/calorie supplementation  Rexene Alberts, MD 06/01/2020 11:35 AM

## 2020-06-01 NOTE — Progress Notes (Signed)
Pt has been very sad,withdrawn and tearful today. Says he is mad at his body for not healing faster. Encouraged pt with gains he has made. Wife also trying. Pt refused to get oob after returning to bed at 10 am. Pt eating very little, he sips on ensure most of day but only manages to drink about 1/2 of each.   [name]

## 2020-06-02 ENCOUNTER — Inpatient Hospital Stay (HOSPITAL_COMMUNITY): Payer: Medicare HMO

## 2020-06-02 DIAGNOSIS — Z951 Presence of aortocoronary bypass graft: Secondary | ICD-10-CM | POA: Diagnosis not present

## 2020-06-02 DIAGNOSIS — I5043 Acute on chronic combined systolic (congestive) and diastolic (congestive) heart failure: Secondary | ICD-10-CM | POA: Diagnosis not present

## 2020-06-02 DIAGNOSIS — N1832 Chronic kidney disease, stage 3b: Secondary | ICD-10-CM | POA: Diagnosis not present

## 2020-06-02 LAB — GLUCOSE, CAPILLARY
Glucose-Capillary: 154 mg/dL — ABNORMAL HIGH (ref 70–99)
Glucose-Capillary: 156 mg/dL — ABNORMAL HIGH (ref 70–99)
Glucose-Capillary: 162 mg/dL — ABNORMAL HIGH (ref 70–99)
Glucose-Capillary: 115 mg/dL — ABNORMAL HIGH (ref 70–99)
Glucose-Capillary: 148 mg/dL — ABNORMAL HIGH (ref 70–99)

## 2020-06-02 LAB — COMPREHENSIVE METABOLIC PANEL
ALT: 20 U/L (ref 0–44)
AST: 26 U/L (ref 15–41)
Albumin: 3.1 g/dL — ABNORMAL LOW (ref 3.5–5.0)
Alkaline Phosphatase: 61 U/L (ref 38–126)
Anion gap: 9 (ref 5–15)
BUN: 29 mg/dL — ABNORMAL HIGH (ref 8–23)
CO2: 33 mmol/L — ABNORMAL HIGH (ref 22–32)
Calcium: 9.1 mg/dL (ref 8.9–10.3)
Chloride: 98 mmol/L (ref 98–111)
Creatinine, Ser: 1.63 mg/dL — ABNORMAL HIGH (ref 0.61–1.24)
Glucose, Bld: 144 mg/dL — ABNORMAL HIGH (ref 70–99)
Potassium: 3.9 mmol/L (ref 3.5–5.1)
Sodium: 140 mmol/L (ref 135–145)
Total Bilirubin: 1.2 mg/dL (ref 0.3–1.2)
Total Protein: 6 g/dL — ABNORMAL LOW (ref 6.5–8.1)

## 2020-06-02 LAB — CBC
HCT: 29 % — ABNORMAL LOW (ref 39.0–52.0)
Hemoglobin: 9.3 g/dL — ABNORMAL LOW (ref 13.0–17.0)
MCH: 31.6 pg (ref 26.0–34.0)
MCHC: 32.1 g/dL (ref 30.0–36.0)
MCV: 98.6 fL (ref 80.0–100.0)
Platelets: 236 10*3/uL (ref 150–400)
RBC: 2.94 MIL/uL — ABNORMAL LOW (ref 4.22–5.81)
RDW: 16.1 % — ABNORMAL HIGH (ref 11.5–15.5)
WBC: 10.2 10*3/uL (ref 4.0–10.5)
nRBC: 0 % (ref 0.0–0.2)

## 2020-06-02 LAB — COOXEMETRY PANEL
Carboxyhemoglobin: 1.2 % (ref 0.5–1.5)
Carboxyhemoglobin: 1.2 % (ref 0.5–1.5)
Methemoglobin: 1 % (ref 0.0–1.5)
Methemoglobin: 0.9 % (ref 0.0–1.5)
O2 Saturation: 67.3 %
O2 Saturation: 60.8 %
Total hemoglobin: 9.6 g/dL — ABNORMAL LOW (ref 12.0–16.0)
Total hemoglobin: 11.6 g/dL — ABNORMAL LOW (ref 12.0–16.0)

## 2020-06-02 LAB — URINALYSIS, ROUTINE W REFLEX MICROSCOPIC
Bilirubin Urine: NEGATIVE
Glucose, UA: NEGATIVE mg/dL
Hgb urine dipstick: NEGATIVE
Ketones, ur: NEGATIVE mg/dL
Leukocytes,Ua: NEGATIVE
Nitrite: NEGATIVE
Protein, ur: NEGATIVE mg/dL
Specific Gravity, Urine: 1.013 (ref 1.005–1.030)
pH: 8 (ref 5.0–8.0)

## 2020-06-02 LAB — MAGNESIUM: Magnesium: 2.6 mg/dL — ABNORMAL HIGH (ref 1.7–2.4)

## 2020-06-02 LAB — PREALBUMIN: Prealbumin: 17.4 mg/dL — ABNORMAL LOW (ref 18–38)

## 2020-06-02 MED ORDER — AMIODARONE HCL 200 MG PO TABS
200.0000 mg | ORAL_TABLET | Freq: Every day | ORAL | Status: DC
  Administered 2020-06-02 – 2020-06-03 (×2): 200 mg via ORAL
  Filled 2020-06-02 (×2): qty 1

## 2020-06-02 MED ORDER — TORSEMIDE 20 MG PO TABS
40.0000 mg | ORAL_TABLET | Freq: Every day | ORAL | Status: DC
  Filled 2020-06-02: qty 2

## 2020-06-02 MED ORDER — DOPAMINE-DEXTROSE 3.2-5 MG/ML-% IV SOLN: 2.0000 ug/kg/min | INTRAVENOUS | Status: DC

## 2020-06-02 MED ORDER — POTASSIUM CHLORIDE CRYS ER 20 MEQ PO TBCR
20.0000 meq | EXTENDED_RELEASE_TABLET | Freq: Two times a day (BID) | ORAL | Status: DC
  Administered 2020-06-02 – 2020-06-03 (×4): 20 meq via ORAL
  Filled 2020-06-02 (×4): qty 1

## 2020-06-02 MED ORDER — ESCITALOPRAM OXALATE 10 MG PO TABS
5.0000 mg | ORAL_TABLET | Freq: Every day | ORAL | Status: DC
  Administered 2020-06-02 – 2020-06-13 (×12): 5 mg via ORAL
  Filled 2020-06-02 (×12): qty 1

## 2020-06-02 NOTE — Progress Notes (Signed)
Patient ID: [name], male   DOB: 1927-10-11, 86 y.o.   MRN: [id number] EVENING ROUNDS NOTE :     [address]       [place] [address]             [phone]                 10 Days Post-Op Procedure(s) (LRB): AORTIC VALVE REPLACEMENT (AVR) USING EDWARDS RESILIA 29 MM AORTIC VALVE. (N/A) CORONARY ARTERY BYPASS GRAFTING (CABG) USING LIMA to LAD; ENDOSCOPIC HARVESTED RIGHT GREATER SAPHENOUS VEIN: SVG to PD; SVG to SEQUENCED INTERMEDIATE to OM (N/A) TRANSESOPHAGEAL ECHOCARDIOGRAM (TEE) (N/A) ENDOVEIN HARVEST OF GREATER SAPHENOUS VEIN (Right)  Total Length of Stay:  LOS: 17 days  BP 94/72   Pulse 86   Temp (!) 97.4 F (36.3 C) (Oral)   Resp 16   Ht 5\' 9"  (1.753 m)   Wt 77 kg   SpO2 96%   BMI 25.07 kg/m   .Intake/Output      12/05 0701 - 12/06 0700 12/06 0701 - 12/07 0700   P.O. 840    I.V. (mL/kg) 216.6 (2.8) 25.8 (0.3)   IV Piggyback     Total Intake(mL/kg) 1056.6 (13.7) 25.8 (0.3)   Urine (mL/kg/hr) 2573 (1.4) 480 (0.6)   Total Output 2573 480   Net -1516.4 -454.2          . norepinephrine (LEVOPHED) Adult infusion 1 mcg/min (06/02/20 1600)     Lab Results  Component Value Date   WBC 10.2 06/02/2020   HGB 9.3 (L) 06/02/2020   HCT 29.0 (L) 06/02/2020   PLT 236 06/02/2020   GLUCOSE 144 (H) 06/02/2020   ALT 20 06/02/2020   AST 26 06/02/2020   NA 140 06/02/2020   K 3.9 06/02/2020   CL 98 06/02/2020   CREATININE 1.63 (H) 06/02/2020   BUN 29 (H) 06/02/2020   CO2 33 (H) 06/02/2020   TSH 0.94 10/02/2012   INR 1.3 (H) 05/23/2020   HGBA1C 6.2 (H) 05/16/2020   Slow progress Weak    Grace Isaac MD  Beeper [phone] Office [phone] 06/02/2020 5:44 PM

## 2020-06-02 NOTE — Progress Notes (Addendum)
Advanced Heart Failure Rounding Note  PCP-Cardiologist: Peter Martinique, MD   Subjective:    Yesterday IV lasix stopped and switched to torsemide. Weight down 24 pounds.   Remains on norepi 2 mcg + dopamine 2 mcg. CO-OX 67%.   Complaining of fatigue. Had a hard time standing. Denies SOB.   Echo 12/1  LVEF 30-35% (unchanged). RV systolic function mildly reduced. No pericardial effusion. No aortic valve prosthesis dysfunction.    Objective:   Weight Range: 77 kg Body mass index is 25.07 kg/m.   Vital Signs:   Temp:  [96.7 F (35.9 C)-98 F (36.7 C)] 97.9 F (36.6 C) (12/06 0629) Pulse Rate:  [68-90] 76 (12/06 0630) Resp:  [13-26] 23 (12/06 0630) BP: (87-108)/(53-74) 108/57 (12/06 0400) SpO2:  [84 %-100 %] 95 % (12/06 0630) Weight:  [77 kg] 77 kg (12/06 0630) Last BM Date: 05/30/20  Weight change: Filed Weights   05/31/20 0600 06/01/20 0500 06/02/20 0630  Weight: 81.2 kg 78.8 kg 77 kg    Intake/Output:   Intake/Output Summary (Last 24 hours) at 06/02/2020 0725 Last data filed at 06/02/2020 0500 Gross per 24 hour  Intake 1036.14 ml  Output 2573 ml  Net -1536.86 ml      Physical Exam   CVP 8 General:  In the chair. No resp difficulty HEENT: normal Neck: supple. JVP 6-7 . Carotids 2+ bilat; no bruits. No lymphadenopathy or thryomegaly appreciated. Cor: PMI nondisplaced. Regular rate & rhythm. No rubs, gallops or murmurs. Sternal incision approximated.  Lungs: clear Abdomen: soft, nontender, nondistended. No hepatosplenomegaly. No bruits or masses. Good bowel sounds. Extremities: no cyanosis, clubbing, rash, R and LLE unna boots. edema. RUE PICC Neuro: alert & orientedx3, cranial nerves grossly intact. moves all 4 extremities w/o difficulty. Affect flat.  GU: Foley  Telemetry  A V paced 80s    Labs    CBC Recent Labs    05/31/20 0344 06/02/20 0331  WBC 9.9 10.2  HGB 9.4* 9.3*  HCT 28.9* 29.0*  MCV 97.3 98.6  PLT 252 962   Basic Metabolic  Panel Recent Labs    06/01/20 0343 06/02/20 0331  NA 139 140  K 4.0 3.9  CL 96* 98  CO2 33* 33*  GLUCOSE 141* 144*  BUN 27* 29*  CREATININE 1.45* 1.63*  CALCIUM 9.0 9.1  MG  --  2.6*   Liver Function Tests Recent Labs    06/02/20 0331  AST 26  ALT 20  ALKPHOS 61  BILITOT 1.2  PROT 6.0*  ALBUMIN 3.1*   No results for input(s): LIPASE, AMYLASE in the last 72 hours. Cardiac Enzymes No results for input(s): CKTOTAL, CKMB, CKMBINDEX, TROPONINI in the last 72 hours.  BNP: BNP (last 3 results) Recent Labs    11/19/19 1550 05/16/20 1736  BNP 1,139.7* 2,005.2*    ProBNP (last 3 results) Recent Labs    01/02/20 1134  PROBNP 1,167.0*     D-Dimer No results for input(s): DDIMER in the last 72 hours. Hemoglobin A1C No results for input(s): HGBA1C in the last 72 hours. Fasting Lipid Panel No results for input(s): CHOL, HDL, LDLCALC, TRIG, CHOLHDL, LDLDIRECT in the last 72 hours. Thyroid Function Tests No results for input(s): TSH, T4TOTAL, T3FREE, THYROIDAB in the last 72 hours.  Invalid input(s): FREET3  Other results:   Imaging    No results found.   Medications:     Scheduled Medications: . acetaZOLAMIDE  250 mg Oral BID  . amiodarone  200 mg Oral Daily  .  aspirin EC  81 mg Oral Daily  . bisacodyl  10 mg Oral Daily   Or  . bisacodyl  10 mg Rectal Daily  . Chlorhexidine Gluconate Cloth  6 each Topical Daily  . docusate sodium  200 mg Oral Daily  . feeding supplement  237 mL Oral BID BM  . ferrous [id number] C-folic acid  1 capsule Oral BID PC  . insulin aspart  0-15 Units Subcutaneous TID WC  . mouth rinse  15 mL Mouth Rinse BID  . midodrine  10 mg Oral TID  . multivitamin with minerals  1 tablet Oral Daily  . pantoprazole  40 mg Oral Daily  . potassium chloride  20 mEq Oral BID  . torsemide  40 mg Oral Daily    Infusions: . DOPamine    . norepinephrine (LEVOPHED) Adult infusion 2 mcg/min (06/02/20 0400)    PRN  Medications: melatonin, ondansetron (ZOFRAN) IV, sodium chloride flush, traMADol   Assessment/Plan   1. Severe 3V CAD: - s/p CABGx 4 (LIMA-LAD, sequential SVG-OM1 and OM2, SVG- PDA) - no s/s ischemia - continue ASA 81 - no ? blocker w/ post-cardiotomy shock and low BP - Continue atorva 40  2. Aortic Stenosis: - severe low flow low gradient AS - s/p tissue AVR 11/26  - AV prosthesis ok on post op echo - stable  3. Acute on Chronic Biventricular Heart Failure:  - Echo in 2014 showed normal LVEF 55-60% w/ G1DD.  - Echo 10/21 and EF 30-35% w/ global HK. RV systolic function mildly reduced. - LHC this admit w/ severe 3V CAD>>ICM. Now s/p CABG. Also w/ severe AS, possibly contributing to LV dysfunction, now s/p SAVR - post op recovery c/b difficulties weaning pressors/inotropes. On NE 2 + Dopamine 3 + midodrine 10 mg tid. Co-ox 72% this am. CVP down to 6 - Post-op echo 12/1 EF 30-35%, RV mildly reduced. AV prothesis ok  - CO-OX 67%. On Norepi 2 + dopamine cut back to 2 mcg this morning. Remains on midodrine 10 mg three times a day.  - Volume status improved. Renal function trending up. Holding torsemide per Dr Cyndia Bent.  - Stop diamox.  -  Continue UNNA - hypotension limits use of ARNi/ARB and spiro   4. Post Operative Atrial Fibrillation:   - continues w/ temp pacing wires, currently A-V paced - Underlying rhythm sinus with long AVB u - AV Pacing. Continue amio twice a day.   - defer a/c to CT surgery    5. AKI:   - Baseline <1.0.  - Creatinine -->1.4 -> 1.6. Holding diuretics as above.   - continue inotropes/ pressors and keep SBP >100   6. Hyponatremia -- Stable.    7. Anemia - expected ABLA from surgery  - Hgb stable  - on PO Fe   8. Debility - PT following.  -Recommending SNF.   9. Constipation - improved with sorbitol  10. Urinary Retention Prior to admit I&O cath at home.  - Has foley. Checking UA/Urine Cx   Length of Stay: [place], NP   06/02/2020, 7:25 AM  Advanced Heart Failure Team Pager [phone] (M-F; [place])  Please contact [facility] for night-coverage after hours (4p -7a ) and weekends on [email or web address]  Agree with above.   Remains on NE 2 dopamine 2. He has been fully diuresed. Creatine has bumped. Remains weak. Unable to stand easily. Gets frustrated.   General:  Elderly . No resp difficulty HEENT: normal Neck: supple. JVP  6. Carotids 2+ bilat; no bruits. No lymphadenopathy or thryomegaly appreciated. Cor: PMI nondisplaced. Regular rate & rhythm. No rubs, gallops or murmurs. Lungs: clear Abdomen: soft, nontender, nondistended. No hepatosplenomegaly. No bruits or masses. Good bowel sounds. Extremities: no cyanosis, clubbing, rash, tr edema Neuro: alert & orientedx3, cranial nerves grossly intact. moves all 4 extremities w/o difficulty. Affect pleasant  Will stop dopamine. Cut amio back to 200 daily. Continue  Aggressive PT. Will get him arm bands. Check UA.   CRITICAL CARE Performed by: Glori Bickers  Total critical care time: 35 minutes  Critical care time was exclusive of separately billable procedures and treating other patients.  Critical care was necessary to treat or prevent imminent or life-threatening deterioration.  Critical care was time spent personally by me (independent of midlevel providers or residents) on the following activities: development of treatment plan with patient and/or surrogate as well as nursing, discussions with consultants, evaluation of patient's response to treatment, examination of patient, obtaining history from patient or surrogate, ordering and performing treatments and interventions, ordering and review of laboratory studies, ordering and review of radiographic studies, pulse oximetry and re-evaluation of patient's condition.  Glori Bickers, MD  8:03 AM

## 2020-06-02 NOTE — Progress Notes (Signed)
10 Days Post-Op Procedure(s) (LRB): AORTIC VALVE REPLACEMENT (AVR) USING EDWARDS RESILIA 29 MM AORTIC VALVE. (N/A) CORONARY ARTERY BYPASS GRAFTING (CABG) USING LIMA to LAD; ENDOSCOPIC HARVESTED RIGHT GREATER SAPHENOUS VEIN: SVG to PD; SVG to SEQUENCED INTERMEDIATE to OM (N/A) TRANSESOPHAGEAL ECHOCARDIOGRAM (TEE) (N/A) ENDOVEIN HARVEST OF GREATER SAPHENOUS VEIN (Right) Subjective:  Feels weak. Nurse reports that he could not walk this am.  Objective: Vital signs in last 24 hours: Temp:  [96.7 F (35.9 C)-98 F (36.7 C)] 97.9 F (36.6 C) (12/06 0629) Pulse Rate:  [68-90] 76 (12/06 0630) Cardiac Rhythm: Atrial paced (12/06 0400) Resp:  [13-26] 23 (12/06 0630) BP: (87-108)/(53-74) 108/57 (12/06 0400) SpO2:  [84 %-100 %] 95 % (12/06 0630) Weight:  [77 kg] 77 kg (12/06 0630)  Hemodynamic parameters for last 24 hours: CVP:  [5 mmHg-11 mmHg] 7 mmHg  Intake/Output from previous day: 12/05 0701 - 12/06 0700 In: 1036.1 [P.O.:840; I.V.:196.1] Out: 2573 [Urine:2573] Intake/Output this shift: No intake/output data recorded.  General appearance: alert and cooperative Neurologic: intact Heart: regular rate and rhythm, S1, S2 normal, no murmur Lungs: diminished breath sounds bibasilar Extremities: minimal edema Wound: incision ok  Lab Results: Recent Labs    05/31/20 0344 06/02/20 0331  WBC 9.9 10.2  HGB 9.4* 9.3*  HCT 28.9* 29.0*  PLT 252 236   BMET:  Recent Labs    06/01/20 0343 06/02/20 0331  NA 139 140  K 4.0 3.9  CL 96* 98  CO2 33* 33*  GLUCOSE 141* 144*  BUN 27* 29*  CREATININE 1.45* 1.63*  CALCIUM 9.0 9.1    PT/INR: No results for input(s): LABPROT, INR in the last 72 hours. ABG    Component Value Date/Time   PHART 7.381 05/26/2020 1750   HCO3 22.2 05/26/2020 1750   TCO2 23 05/26/2020 1750   ACIDBASEDEF 2.0 05/26/2020 1750   O2SAT 67.3 06/02/2020 0331   CBG (last 3)  Recent Labs    06/01/20 1543 06/01/20 2122 06/02/20 0628  GLUCAP 131* 256* 156*    CXR: bilateral lower lobe atelectasis and pleural effusions.  Assessment/Plan: S/P Procedure(s) (LRB): AORTIC VALVE REPLACEMENT (AVR) USING EDWARDS RESILIA 29 MM AORTIC VALVE. (N/A) CORONARY ARTERY BYPASS GRAFTING (CABG) USING LIMA to LAD; ENDOSCOPIC HARVESTED RIGHT GREATER SAPHENOUS VEIN: SVG to PD; SVG to SEQUENCED INTERMEDIATE to OM (N/A) TRANSESOPHAGEAL ECHOCARDIOGRAM (TEE) (N/A) ENDOVEIN HARVEST OF GREATER SAPHENOUS VEIN (Right)  POD 10 Hemodynamically stable on dop 3, NE 2, midodrine. SBP 90's to 100 with MAP 70. Will decrease dop to 2 and try wean levophed off. Co-ox 67.  Rhythm is sinus 70's with frequent PAC's and PVC's, long 1st degree AV block under pacer. Will continue AV pacing to smooth rhythm which should maximize CO. Decrease amio to 200 daily.  Volume excess: he is only a couple lbs over preop wt. -1500 cc yesterday. Lasix drip stopped and started on Demedex. Creat bumped to 1.6 today. May be best to hold diuresis for a day, then resume Demedex.  Generalized weakness and deconditioning. Continue PT/OT.  Work on IS.  Will check UA/UC since he has had a foley in for 10 days while diuresing.   LOS: 17 days    Gaye Pollack 06/02/2020

## 2020-06-02 NOTE — Plan of Care (Signed)
  Problem: Education: Goal: Knowledge of disease and its progression will improve Outcome: Progressing   Problem: Clinical Measurements: Goal: Ability to maintain clinical measurements within normal limits will improve Outcome: Progressing Goal: Will remain free from infection Outcome: Progressing   Problem: Activity: Goal: Risk for activity intolerance will decrease Outcome: Not Progressing   Problem: Nutrition: Goal: Adequate nutrition will be maintained Outcome: Progressing   Problem: Skin Integrity: Goal: Risk for impaired skin integrity will decrease Outcome: Progressing

## 2020-06-02 NOTE — Progress Notes (Signed)
Physical Therapy Treatment Patient Details Name: [name] [name] MRN: [id number] DOB: 12/23/1927 Today's Date: 06/02/2020    History of Present Illness 86 yo male presenting to PCP with worsening LE edema and dyspnea. S/p right/left heart cath and coronary angiography on 11/22. S/p CABG x4 on 11/26. PMH including venous insufficiency with chronic lower extremity edema (mostly on the left), hypertension, hyperlipidemia, peripheral neuropathy, Bil TKA,  essential tremor, and prostate cancer.    PT Comments    Pt admitted with above diagnosis. Pt was able to ambulate with [name] walker with mod assist of 2 with close chair follow as pt fatigues quickly and needs to sit after 35 feet.  Pt stated "I cannot walk anymore as that wore me out."  WEnt back to room and did some LE exercises.  Will continue acute PT. Updated pt frequency as pt D/C plan is SNF therefore frequency per guidelines is 2x week. Pt currently with functional limitations due to balance and endurance deficits. Pt will benefit from skilled PT to increase their independence and safety with mobility to allow discharge to the venue listed below.     Follow Up Recommendations  SNF     Equipment Recommendations  None recommended by PT (has RW at home)    Recommendations for Other Services       Precautions / Restrictions Precautions Precautions: Fall;Sternal Precaution Comments: Reviewed sternal precautions Restrictions Other Position/Activity Restrictions: sternal    Mobility  Bed Mobility               General bed mobility comments: Pt seated in recliner on arrival this session.  Transfers Overall transfer level: Needs assistance Equipment used: Ambulation equipment used (EVA walker) Transfers: Sit to/from Stand Sit to Stand: Mod assist;+2 safety/equipment         General transfer comment: Pt with strong posterior translation this session.  He continues with his feet out in front but they did not slide this session.   NO c/o dizziness.  Ambulation/Gait Ambulation/Gait assistance: +2 safety/equipment;Mod assist Gait Distance (Feet): 35 Feet Assistive device:  (EVA walker) Gait Pattern/deviations: Step-through pattern;Trunk flexed;Decreased stride length;Leaning posteriorly Gait velocity: slowed Gait velocity interpretation: <1.31 ft/sec, indicative of household ambulator General Gait Details: Pt continues to require mod assistance for balance.  Pt required close chair follow for safety this session.  Educated on posture and  hip extension as pt flexes at trunk. Fatigues quickly and cannot continue needing close chair follow.    Stairs             Wheelchair Mobility    Modified Rankin (Stroke Patients Only)       Balance Overall balance assessment: Needs assistance       Postural control: Posterior lean Standing balance support: Bilateral upper extremity supported Standing balance-Leahy Scale: Poor Standing balance comment: requires external assist and B UE to maintain standing balance                            Cognition Arousal/Alertness: Awake/alert Behavior During Therapy: WFL for tasks assessed/performed Overall Cognitive Status: Within Functional Limits for tasks assessed                                        Exercises General Exercises - Lower Extremity Ankle Circles/Pumps: AROM;Both;10 reps;Supine Long Arc Quad: AAROM;Both;10 reps;Seated Hip Flexion/Marching: 10 reps;AAROM;Seated;Both    General Comments  General comments (skin integrity, edema, etc.): VSS with pt on 2LO2 with activity.        Pertinent Vitals/Pain Pain Assessment: Faces Faces Pain Scale: Hurts little more Pain Location: chest surgical site Pain Descriptors / Indicators: Sore;Aching;Grimacing;Guarding Pain Intervention(s): Limited activity within patient's tolerance;Monitored during session;Repositioned    Home Living                      Prior Function             PT Goals (current goals can now be found in the care plan section) Acute Rehab PT Goals Patient Stated Goal: "Get better and go home" Progress towards PT goals: Progressing toward goals    Frequency    Min 2X/week      PT Plan Frequency needs to be updated;Current plan remains appropriate    Co-evaluation              AM-PAC PT "6 Clicks" Mobility   Outcome Measure  Help needed turning from your back to your side while in a flat bed without using bedrails?: A Little Help needed moving from lying on your back to sitting on the side of a flat bed without using bedrails?: A Lot Help needed moving to and from a bed to a chair (including a wheelchair)?: A Lot Help needed standing up from a chair using your arms (e.g., wheelchair or bedside chair)?: A Lot Help needed to walk in hospital room?: A Lot Help needed climbing 3-5 steps with a railing? : A Lot 6 Click Score: 13    End of Session Equipment Utilized During Treatment: Oxygen;Gait belt Activity Tolerance: Patient tolerated treatment well Patient left: in chair;with call bell/phone within reach;with chair alarm set Nurse Communication: Mobility status PT Visit Diagnosis: Muscle weakness (generalized) (M62.81);Difficulty in walking, not elsewhere classified (R26.2);Other abnormalities of gait and mobility (R26.89);Pain Pain - part of body:  (sternum)     Time: 1610-9604 PT Time Calculation (min) (ACUTE ONLY): 18 min  Charges:  $Gait Training: 8-22 mins                     Muna Demers W,PT [place] Pager:  [phone]  Office:  [place] 06/02/2020, 3:00 PM

## 2020-06-02 NOTE — Progress Notes (Addendum)
Nutrition Follow-up  DOCUMENTATION CODES:   Non-severe (moderate) malnutrition in context of chronic illness  INTERVENTION:   Recommend liberalizing diet to regular to increase po intake  Begin 48 hour calorie count   Magic cup BID with meals, each supplement provides 290 kcal and 9 grams of protein  Ensure Enlive po BID, each supplement provides 350 kcal and 20 grams of protein  MVI with minerals  NUTRITION DIAGNOSIS:   Moderate Malnutrition related to chronic illness (CHF, LLE edema) as evidenced by mild fat depletion, severe muscle depletion, moderate muscle depletion.  Ongoing  GOAL:   Patient will meet greater than or equal to 90% of their needs  Ongoing  MONITOR:   PO intake, Supplement acceptance, Labs, Weight trends, Skin  REASON FOR ASSESSMENT:   Consult Assessment of nutrition requirement/status, Wound healing  ASSESSMENT:   Pt with PMH of HTN, HLD, chronic LLE, peripheral neuropathy, essential tremor, and prostate cancer. Presented with CHF exacerbation and severe low flow AS. S/p right/left heart cath and coronary angiography on 11/22. Underwent CABG x4 and tissue AVR on 11/26.  Spoke with pt at bedside. Pt reports eating very little d/t no appetite the past few days. Pt reports he is able to drink about two Ensures a day. Pt reports he also consumes applesauce and orange juice once daily but that is all. Per RN, pt is consuming very little. RN mentioned he is excited about his lunch of tomato soup and a grilled cheese sandwich ordered for today. Per chart review, pt has documented 2 meal records since last RD visit averaging 12.5% completion.   Pt appeared extremely fatigued and stated he wants to get better but can't because he can't properly rest.    Current weight 77 kg. Net negative 11.8 L. Noted highest weight post op 89.5 kg, weight pre op of 75.5 kg. Edema present but improving, but noted pt with LLE edema.   No BM since 12/3, Noted pt on scheduled  bowel regimen. Denies nausea, vomiting and pain.  Labs reviewed: CBG's x 24 hours: 124-256  Medications reviewed and include: torsemide, dolcolax, colace, SSI, MVI with minerals, protonix, trinsicon/foltrin   Diet Order:   Diet Order            Diet Heart Room service appropriate? Yes; Fluid consistency: Thin  Diet effective now                 EDUCATION NEEDS:   Education needs have been addressed  Skin:  Skin Assessment: Reviewed RN Assessment  Last BM:  05/30/2020  Height:   Ht Readings from Last 1 Encounters:  05/23/20 5\' 9"  (1.753 m)    Weight:   Wt Readings from Last 1 Encounters:  06/02/20 77 kg    Ideal Body Weight:  72.7 kg  BMI:  Body mass index is 25.07 kg/m.  Estimated Nutritional Needs:   Kcal:  2100-2300  Protein:  110-120  Fluid:  >/= 2 L/day   Ronnald Nian, Dietetic Intern Pager: [phone] If unavailable: [phone]

## 2020-06-03 DIAGNOSIS — N183 Chronic kidney disease, stage 3 unspecified: Secondary | ICD-10-CM | POA: Diagnosis not present

## 2020-06-03 DIAGNOSIS — I5043 Acute on chronic combined systolic (congestive) and diastolic (congestive) heart failure: Secondary | ICD-10-CM | POA: Diagnosis not present

## 2020-06-03 LAB — URINE CULTURE: Culture: NO GROWTH

## 2020-06-03 LAB — GLUCOSE, CAPILLARY
Glucose-Capillary: 125 mg/dL — ABNORMAL HIGH (ref 70–99)
Glucose-Capillary: 172 mg/dL — ABNORMAL HIGH (ref 70–99)
Glucose-Capillary: 137 mg/dL — ABNORMAL HIGH (ref 70–99)
Glucose-Capillary: 117 mg/dL — ABNORMAL HIGH (ref 70–99)

## 2020-06-03 LAB — BASIC METABOLIC PANEL
Anion gap: 8 (ref 5–15)
BUN: 29 mg/dL — ABNORMAL HIGH (ref 8–23)
CO2: 33 mmol/L — ABNORMAL HIGH (ref 22–32)
Calcium: 9.2 mg/dL (ref 8.9–10.3)
Chloride: 101 mmol/L (ref 98–111)
Creatinine, Ser: 1.56 mg/dL — ABNORMAL HIGH (ref 0.61–1.24)
GFR, Estimated: 42 mL/min — ABNORMAL LOW (ref >60)
Glucose, Bld: 139 mg/dL — ABNORMAL HIGH (ref 70–99)
Potassium: 4 mmol/L (ref 3.5–5.1)
Sodium: 142 mmol/L (ref 135–145)

## 2020-06-03 LAB — COOXEMETRY PANEL
Carboxyhemoglobin: 1.3 % (ref 0.5–1.5)
Methemoglobin: 0.8 % (ref 0.0–1.5)
O2 Saturation: 67.4 %
Total hemoglobin: 9.4 g/dL — ABNORMAL LOW (ref 12.0–16.0)

## 2020-06-03 MED ORDER — ATORVASTATIN CALCIUM 40 MG PO TABS
40.0000 mg | ORAL_TABLET | Freq: Every day | ORAL | Status: DC
  Administered 2020-06-03: 40 mg via ORAL
  Filled 2020-06-03: qty 1

## 2020-06-03 NOTE — Progress Notes (Signed)
Calorie Count Check-In Note  48 hour calorie count ordered. Calorie count not yet completed. Spoke with RN. She observed very little intake last night at dinner. RN reports consumption of yogurt this morning. Reports he is drinking Ensure well. RN to record lunch today. No documented meal completions since 12/3.   Diet: Heart Healthy/ Thin liquids Supplements: Ensure Ensure BID, Magic cup BID  12/6:  Breakfast: no data Lunch: 32.5 kcal, 0 grams protein Dinner: no data Supplements: 350 kcal, 20 gram protien  Total intake from available data: 382.5 kcal (18% of minimum estimated needs)  20 gram protein (18% of minimum estimated needs)  Nutrition Dx: Non-severe (moderate) malnutrition in context of chronic illness  Goal: Patient will meet greater than or equal to 90% of their needs  Intervention:   Recommend liberalizing diet to regular to increase po intake  Continue 48 hour calorie count; discussed calorie count with RN   Magic cup BID with meals, each supplement provides 290 kcal and 9 grams of protein  Ensure Enlive po BID, each supplement provides 350 kcal and 20 grams of protein  MVI with minerals   Ronnald Nian, Dietetic Intern Pager: [phone] If unavailable: [phone]

## 2020-06-03 NOTE — Progress Notes (Signed)
Advanced Heart Failure Rounding Note  PCP-Cardiologist: Peter Martinique, MD   Subjective:    Off NE and dopamine.  Feels tired but had a good night sleep. Still weak. Able to ambulate unit with PT.   Co-ox 67% Creatinine improving. CVP 6  Echo 12/1  LVEF 30-35% (unchanged). RV systolic function mildly reduced. No pericardial effusion. No aortic valve prosthesis dysfunction.    Objective:   Weight Range: 77 kg Body mass index is 25.07 kg/m.   Vital Signs:   Temp:  [96.8 F (36 C)-99 F (37.2 C)] 96.9 F (36.1 C) (12/07 0425) Pulse Rate:  [76-86] 86 (12/07 0300) Resp:  [15-27] 16 (12/07 0300) BP: (93-111)/(65-84) 94/74 (12/07 0300) SpO2:  [92 %-100 %] 96 % (12/07 0300) Weight:  [77 kg] 77 kg (12/06 0630) Last BM Date: 05/30/20  Weight change: Filed Weights   05/31/20 0600 06/01/20 0500 06/02/20 0630  Weight: 81.2 kg 78.8 kg 77 kg    Intake/Output:   Intake/Output Summary (Last 24 hours) at 06/03/2020 0615 Last data filed at 06/03/2020 0100 Gross per 24 hour  Intake 46.53 ml  Output 780 ml  Net -733.47 ml      Physical Exam   General:  Elderly weak . No resp difficulty HEENT: normal Neck: supple. no JVD. Carotids 2+ bilat; no bruits. No lymphadenopathy or thryomegaly appreciated. Cor: PMI nondisplaced. Regular rate & rhythm. No rubs, gallops or murmurs. Lungs: clear Abdomen: soft, nontender, nondistended. No hepatosplenomegaly. No bruits or masses. Good bowel sounds. Extremities: no cyanosis, clubbing, rash, tr edema + UNNA Neuro: alert & orientedx3, cranial nerves grossly intact. moves all 4 extremities w/o difficulty. Affect pleasant  Telemetry   A V paced 80s -> I turned pacer off NSR 70s with long AVB and occasional PVCs. Personally reviewed   Labs    CBC Recent Labs    06/02/20 0331  WBC 10.2  HGB 9.3*  HCT 29.0*  MCV 98.6  PLT 371   Basic Metabolic Panel Recent Labs    06/02/20 0331 06/03/20 0411  NA 140 142  K 3.9 4.0  CL 98  101  CO2 33* 33*  GLUCOSE 144* 139*  BUN 29* 29*  CREATININE 1.63* 1.56*  CALCIUM 9.1 9.2  MG 2.6*  --    Liver Function Tests Recent Labs    06/02/20 0331  AST 26  ALT 20  ALKPHOS 61  BILITOT 1.2  PROT 6.0*  ALBUMIN 3.1*   No results for input(s): LIPASE, AMYLASE in the last 72 hours. Cardiac Enzymes No results for input(s): CKTOTAL, CKMB, CKMBINDEX, TROPONINI in the last 72 hours.  BNP: BNP (last 3 results) Recent Labs    11/19/19 1550 05/16/20 1736  BNP 1,139.7* 2,005.2*    ProBNP (last 3 results) Recent Labs    01/02/20 1134  PROBNP 1,167.0*     D-Dimer No results for input(s): DDIMER in the last 72 hours. Hemoglobin A1C No results for input(s): HGBA1C in the last 72 hours. Fasting Lipid Panel No results for input(s): CHOL, HDL, LDLCALC, TRIG, CHOLHDL, LDLDIRECT in the last 72 hours. Thyroid Function Tests No results for input(s): TSH, T4TOTAL, T3FREE, THYROIDAB in the last 72 hours.  Invalid input(s): FREET3  Other results:   Imaging    No results found.   Medications:     Scheduled Medications: . amiodarone  200 mg Oral Daily  . aspirin EC  81 mg Oral Daily  . bisacodyl  10 mg Oral Daily   Or  . bisacodyl  10 mg Rectal Daily  . Chlorhexidine Gluconate Cloth  6 each Topical Daily  . docusate sodium  200 mg Oral Daily  . escitalopram  5 mg Oral Daily  . feeding supplement  237 mL Oral BID BM  . ferrous VELFYBOF-B51-WCHENID C-folic acid  1 capsule Oral BID PC  . insulin aspart  0-15 Units Subcutaneous TID WC  . mouth rinse  15 mL Mouth Rinse BID  . midodrine  10 mg Oral TID  . multivitamin with minerals  1 tablet Oral Daily  . pantoprazole  40 mg Oral Daily  . potassium chloride  20 mEq Oral BID  . torsemide  40 mg Oral Daily    Infusions: . norepinephrine (LEVOPHED) Adult infusion Stopped (06/02/20 1613)    PRN Medications: melatonin, ondansetron (ZOFRAN) IV, sodium chloride flush, traMADol   Assessment/Plan   1. Severe  3V CAD: - s/p CABGx 4 (LIMA-LAD, sequential SVG-OM1 and OM2, SVG- PDA) - no s/s ischemia - continue ASA 81 - no ? blocker w/ post-cardiotomy shock and low BP - Continue atorva 40  2. Aortic Stenosis: - severe low flow low gradient AS - s/p tissue AVR 11/26  - AV prosthesis ok on post op echo - stable  3. Acute on Chronic Biventricular Heart Failure:  - Echo in 2014 showed normal LVEF 55-60% w/ G1DD.  - Echo 10/21 and EF 30-35% w/ global HK. RV systolic function mildly reduced. - LHC this admit w/ severe 3V CAD>>ICM. Now s/p CABG. Also w/ severe AS, possibly contributing to LV dysfunction, now s/p SAVR - post op recovery c/b difficulties weaning pressors/inotropes. On NE 2 + Dopamine 3 + midodrine 10 mg tid. Co-ox 72% this am. CVP down to 6 - Post-op echo 12/1 EF 30-35%, RV mildly reduced. AV prothesis ok  - Off pressors. Co-ox 67%  Continue to hold diuretics - Volume status ok.  -  Continue UNNA - hypotension limits use of ARNi/ARB and spiro   4. Post Operative Atrial Fibrillation:   - continues w/ temp pacing wires, currently A-V paced -> I turned pacer off NSR 70s with long AVB and occasional PVCs - defer a/c to CT surgery    5. AKI:   - Baseline <1.0.  - Creatinine -->1.4 -> 1.6 -> 1.5 Holding diuretics as above.   - continue inotropes/ pressors and keep SBP >100   6. Hyponatremia -- Stable.    7. Anemia - expected ABLA from surgery  - Hgb stable  - on PO Fe   8. Debility - PT following.  -Recommending SNF.   9. Constipation - improved with sorbitol  10. Urinary Retention Prior to admit I&O cath at home.  - Has foley. UA negative  Can go to 2C from my standpoint. Continue to mobilize. I am again asking PT to provide resistance bands.  Length of Stay: [place], MD  06/03/2020, 6:15 AM  Advanced Heart Failure Team Pager [phone] (M-F; [place])  Please contact [facility] for night-coverage after hours (4p -7a ) and weekends on  [email or web address]   6:15 AM

## 2020-06-03 NOTE — Progress Notes (Signed)
Physical Therapy Treatment Patient Details Name: [name] [name] MRN: [id number] DOB: [birth year] Today's Date: 06/03/2020    History of Present Illness 86 yo male presenting to PCP with worsening LE edema and dyspnea. S/p right/left heart cath and coronary angiography on 11/22. S/p CABG x4 on 11/26. PMH including venous insufficiency with chronic lower extremity edema (mostly on the left), hypertension, hyperlipidemia, peripheral neuropathy, Bil TKA,  essential tremor, and prostate cancer.    PT Comments    Pt admitted with above diagnosis. Pt was able to progress distance with ambulation today with EVa walker with min to mod assist with chair follow. Initiated exercises with theraband however pt will need staff present when performing as he cannot follow sternal precautions on his own with shoulder movements. Also level 1 theraband is too strong for pt as his shoulder movements still need AA/ROM to complete full ROM and he is not ready for even level 1 theraband currently.  HE was able to perform elbow exercises with the level 1 theraband with cues from therapist for staying in the tube.  Will continue to follow acutely. Pt currently with functional limitations due to balance and endurance deficits. Pt will benefit from skilled PT to increase their independence and safety with mobility to allow discharge to the venue listed below.     Follow Up Recommendations  SNF     Equipment Recommendations  None recommended by PT (has RW at home)    Recommendations for Other Services       Precautions / Restrictions Precautions Precautions: Fall;Sternal Precaution Comments: Reviewed sternal precautions Restrictions Other Position/Activity Restrictions: sternal    Mobility  Bed Mobility               General bed mobility comments: Pt seated in recliner on arrival this session.  Transfers Overall transfer level: Needs assistance Equipment used: Ambulation equipment used (EVA  walker) Transfers: Sit to/from Stand Sit to Stand: Mod assist;+2 safety/equipment         General transfer comment: Meeds mod assist to power up and steadying assist but once up, incr steadiness with UE support.   Ambulation/Gait Ambulation/Gait assistance: +2 safety/equipment;Mod assist;Min assist Gait Distance (Feet): 100 Feet Assistive device:  (EVA walker) Gait Pattern/deviations: Step-through pattern;Trunk flexed;Decreased stride length;Leaning posteriorly Gait velocity: slowed Gait velocity interpretation: <1.31 ft/sec, indicative of household ambulator General Gait Details: Pt continues to require min to mod assistance for balance.  Pt overall incr distance today and got steadier as he progressed with his walking.  Pt required close chair follow for safety this session.  Educated on posture and  hip extension as pt flexes at trunk. Fatigues quickly needing close chair follow.    Stairs             Wheelchair Mobility    Modified Rankin (Stroke Patients Only)       Balance         Postural control: Posterior lean Standing balance support: Bilateral upper extremity supported Standing balance-Leahy Scale: Poor Standing balance comment: requires external assist and B UE to maintain standing balance                            Cognition Arousal/Alertness: Awake/alert Behavior During Therapy: WFL for tasks assessed/performed Overall Cognitive Status: Within Functional Limits for tasks assessed  Exercises General Exercises - Upper Extremity Shoulder Flexion: AAROM;Both;5 reps;Seated (Cannot do this with LEvel 1 theraband) Elbow Flexion: AROM;Both;10 reps;Seated;Theraband;AAROM Theraband Level (Elbow Flexion): Level 1 (Yellow) Elbow Extension: AROM;Both;10 reps;Seated General Exercises - Lower Extremity Ankle Circles/Pumps: AROM;Both;10 reps;Supine Long Arc Quad: AAROM;Both;10 reps;Seated Hip  ABduction/ADduction: AAROM;Both;10 reps;Seated Hip Flexion/Marching: 10 reps;AAROM;Seated;Both Other Exercises Other Exercises: Discussed theraband exercises with pt and the need to stay in the tube.  Pt cannot stay in the tube when performing shoulder exercises unless assisted by PT therefore theraband isnt appropriate for shoulder exercises currently. He also could not perform without AA/ROM therefore level 1 theraband is too difficult for shoulder.  HE was able to perform elbow exercises with theraband and stay in tube.  Discussed with pt to only do theraband exercises with staff for safety.      General Comments General comments (skin integrity, edema, etc.): VSS with pt on 2L O2 on arrival and incr to 6L with activity for sats to stay >90%.  Back to 2L once back to room. other VSS.       Pertinent Vitals/Pain Pain Assessment: Faces Faces Pain Scale: Hurts little more Pain Location: chest surgical site Pain Descriptors / Indicators: Sore;Aching;Grimacing;Guarding Pain Intervention(s): Limited activity within patient's tolerance;Monitored during session;Repositioned    Home Living                      Prior Function            PT Goals (current goals can now be found in the care plan section) Acute Rehab PT Goals Patient Stated Goal: "Get better and go home" Progress towards PT goals: Progressing toward goals    Frequency    Min 2X/week      PT Plan Current plan remains appropriate    Co-evaluation              AM-PAC PT "6 Clicks" Mobility   Outcome Measure  Help needed turning from your back to your side while in a flat bed without using bedrails?: A Little Help needed moving from lying on your back to sitting on the side of a flat bed without using bedrails?: A Lot Help needed moving to and from a bed to a chair (including a wheelchair)?: A Lot Help needed standing up from a chair using your arms (e.g., wheelchair or bedside chair)?: A Lot Help  needed to walk in hospital room?: A Lot Help needed climbing 3-5 steps with a railing? : A Lot 6 Click Score: 13    End of Session Equipment Utilized During Treatment: Oxygen;Gait belt Activity Tolerance: Patient tolerated treatment well Patient left: in chair;with call bell/phone within reach;with chair alarm set Nurse Communication: Mobility status PT Visit Diagnosis: Muscle weakness (generalized) (M62.81);Difficulty in walking, not elsewhere classified (R26.2);Other abnormalities of gait and mobility (R26.89);Pain Pain - part of body:  (sternum)     Time: 7654-6503 PT Time Calculation (min) (ACUTE ONLY): 27 min  Charges:  $Gait Training: 8-22 mins $Therapeutic Exercise: 8-22 mins                     [name] Pettry W,PT Acute Rehabilitation Services Pager:  [phone]  Office:  [place] 06/03/2020, 12:28 PM

## 2020-06-03 NOTE — Progress Notes (Signed)
11 Days Post-Op Procedure(s) (LRB): AORTIC VALVE REPLACEMENT (AVR) USING EDWARDS RESILIA 29 MM AORTIC VALVE. (N/A) CORONARY ARTERY BYPASS GRAFTING (CABG) USING LIMA to LAD; ENDOSCOPIC HARVESTED RIGHT GREATER SAPHENOUS VEIN: SVG to PD; SVG to SEQUENCED INTERMEDIATE to OM (N/A) TRANSESOPHAGEAL ECHOCARDIOGRAM (TEE) (N/A) ENDOVEIN HARVEST OF GREATER SAPHENOUS VEIN (Right) Subjective:  Tired and weak but sitting up, ate some breakfast. Ambulated some with PT yesterday.  Objective: Vital signs in last 24 hours: Temp:  [96.8 F (36 C)-99 F (37.2 C)] 96.8 F (36 C) (12/07 0800) Pulse Rate:  [35-86] 35 (12/07 0900) Cardiac Rhythm: Normal sinus rhythm (12/07 0800) Resp:  [13-25] 21 (12/07 0900) BP: (93-111)/(65-84) 95/65 (12/07 0900) SpO2:  [92 %-100 %] 93 % (12/07 0900)  Hemodynamic parameters for last 24 hours: CVP:  [3 mmHg-11 mmHg] 6 mmHg  Intake/Output from previous day: 12/06 0701 - 12/07 0700 In: 26 [I.V.:26] Out: 1055 [Urine:1055] Intake/Output this shift: No intake/output data recorded.  General appearance: fatigued Neurologic: intact Heart: regular rate and rhythm and frequent extra beats Lungs: diminished breath sounds bibasilar Abdomen: soft, non-tender; bowel sounds normal Extremities:  no edema Wound: incision healing well.  Lab Results: Recent Labs    06/02/20 0331  WBC 10.2  HGB 9.3*  HCT 29.0*  PLT 236   BMET:  Recent Labs    06/02/20 0331 06/03/20 0411  NA 140 142  K 3.9 4.0  CL 98 101  CO2 33* 33*  GLUCOSE 144* 139*  BUN 29* 29*  CREATININE 1.63* 1.56*  CALCIUM 9.1 9.2    PT/INR: No results for input(s): LABPROT, INR in the last 72 hours. ABG    Component Value Date/Time   PHART 7.381 05/26/2020 1750   HCO3 22.2 05/26/2020 1750   TCO2 23 05/26/2020 1750   ACIDBASEDEF 2.0 05/26/2020 1750   O2SAT 67.4 06/03/2020 0411   CBG (last 3)  Recent Labs    06/02/20 1549 06/02/20 2231 06/03/20 0713  GLUCAP 115* 148* 125*     Assessment/Plan: S/P Procedure(s) (LRB): AORTIC VALVE REPLACEMENT (AVR) USING EDWARDS RESILIA 29 MM AORTIC VALVE. (N/A) CORONARY ARTERY BYPASS GRAFTING (CABG) USING LIMA to LAD; ENDOSCOPIC HARVESTED RIGHT GREATER SAPHENOUS VEIN: SVG to PD; SVG to SEQUENCED INTERMEDIATE to OM (N/A) TRANSESOPHAGEAL ECHOCARDIOGRAM (TEE) (N/A) ENDOVEIN HARVEST OF GREATER SAPHENOUS VEIN (Right)  POD 11 Hemodynamically stable off NE and dop. Still on midodrine 10 tid.  Rhythm is sinus with frequent premature atrial and ventricular beats. Rate 70's on amio 200 daily.  Wt is 3 lbs over preop. Diuretic on hold since wt is decreasing daily and creat bumped to 1.63. Today creat down slightly to 1.56.  Work on IS.  PT/OT.  Slow progress and needs lots of encouragement and help. He just got off vasopressors so will keep in ICU.   LOS: 18 days    [name] [name] 06/03/2020

## 2020-06-03 NOTE — Progress Notes (Signed)
TCTS BRIEF SICU PROGRESS NOTE  11 Days Post-Op  S/P Procedure(s) (LRB): AORTIC VALVE REPLACEMENT (AVR) USING EDWARDS RESILIA 29 MM AORTIC VALVE. (N/A) CORONARY ARTERY BYPASS GRAFTING (CABG) USING LIMA to LAD; ENDOSCOPIC HARVESTED RIGHT GREATER SAPHENOUS VEIN: SVG to PD; SVG to SEQUENCED INTERMEDIATE to OM (N/A) TRANSESOPHAGEAL ECHOCARDIOGRAM (TEE) (N/A) ENDOVEIN HARVEST OF GREATER SAPHENOUS VEIN (Right)   Stable day.  Ambulated a little further BP stable off drips  Plan: Continue current plan  Rexene Alberts, MD 06/03/2020 5:19 PM

## 2020-06-04 ENCOUNTER — Inpatient Hospital Stay (HOSPITAL_COMMUNITY): Payer: Medicare HMO

## 2020-06-04 DIAGNOSIS — E440 Moderate protein-calorie malnutrition: Secondary | ICD-10-CM

## 2020-06-04 LAB — BASIC METABOLIC PANEL
Anion gap: 10 (ref 5–15)
BUN: 33 mg/dL — ABNORMAL HIGH (ref 8–23)
CO2: 28 mmol/L (ref 22–32)
Calcium: 9.2 mg/dL (ref 8.9–10.3)
Chloride: 102 mmol/L (ref 98–111)
Creatinine, Ser: 1.51 mg/dL — ABNORMAL HIGH (ref 0.61–1.24)
GFR, Estimated: 44 mL/min — ABNORMAL LOW (ref >60)
Glucose, Bld: 144 mg/dL — ABNORMAL HIGH (ref 70–99)
Potassium: 4.7 mmol/L (ref 3.5–5.1)
Sodium: 140 mmol/L (ref 135–145)

## 2020-06-04 LAB — COOXEMETRY PANEL
Carboxyhemoglobin: 1.3 % (ref 0.5–1.5)
Methemoglobin: 1.2 % (ref 0.0–1.5)
O2 Saturation: 66.6 %
Total hemoglobin: 9.9 g/dL — ABNORMAL LOW (ref 12.0–16.0)

## 2020-06-04 LAB — GLUCOSE, CAPILLARY
Glucose-Capillary: 160 mg/dL — ABNORMAL HIGH (ref 70–99)
Glucose-Capillary: 155 mg/dL — ABNORMAL HIGH (ref 70–99)
Glucose-Capillary: 128 mg/dL — ABNORMAL HIGH (ref 70–99)
Glucose-Capillary: 140 mg/dL — ABNORMAL HIGH (ref 70–99)
Glucose-Capillary: 132 mg/dL — ABNORMAL HIGH (ref 70–99)

## 2020-06-04 LAB — PHOSPHORUS: Phosphorus: 3.5 mg/dL (ref 2.5–4.6)

## 2020-06-04 LAB — MAGNESIUM: Magnesium: 2.7 mg/dL — ABNORMAL HIGH (ref 1.7–2.4)

## 2020-06-04 MED ORDER — PROSOURCE TF PO LIQD
45.0000 mL | Freq: Two times a day (BID) | ORAL | Status: DC
  Administered 2020-06-04 – 2020-06-17 (×27): 45 mL
  Filled 2020-06-04 (×29): qty 45

## 2020-06-04 MED ORDER — DRONABINOL 2.5 MG PO CAPS
5.0000 mg | ORAL_CAPSULE | Freq: Two times a day (BID) | ORAL | Status: DC
  Administered 2020-06-04 – 2020-06-13 (×19): 5 mg via ORAL
  Filled 2020-06-04 (×20): qty 2

## 2020-06-04 MED ORDER — OSMOLITE 1.5 CAL PO LIQD
1000.0000 mL | ORAL | Status: DC
  Administered 2020-06-04 – 2020-06-10 (×5): 1000 mL

## 2020-06-04 MED ORDER — INSULIN ASPART 100 UNIT/ML ~~LOC~~ SOLN
0.0000 [IU] | SUBCUTANEOUS | Status: DC
  Administered 2020-06-04 (×2): 2 [IU] via SUBCUTANEOUS
  Administered 2020-06-05 (×2): 5 [IU] via SUBCUTANEOUS
  Administered 2020-06-05: 2 [IU] via SUBCUTANEOUS
  Administered 2020-06-06: 3 [IU] via SUBCUTANEOUS
  Administered 2020-06-06: 2 [IU] via SUBCUTANEOUS
  Administered 2020-06-06 (×2): 3 [IU] via SUBCUTANEOUS
  Administered 2020-06-07 (×4): 2 [IU] via SUBCUTANEOUS
  Administered 2020-06-08: 5 [IU] via SUBCUTANEOUS
  Administered 2020-06-08 – 2020-06-10 (×8): 2 [IU] via SUBCUTANEOUS
  Administered 2020-06-10: 5 [IU] via SUBCUTANEOUS
  Administered 2020-06-10: 3 [IU] via SUBCUTANEOUS
  Administered 2020-06-10: 2 [IU] via SUBCUTANEOUS
  Administered 2020-06-10: 5 [IU] via SUBCUTANEOUS
  Administered 2020-06-10: 2 [IU] via SUBCUTANEOUS
  Administered 2020-06-11 (×3): 3 [IU] via SUBCUTANEOUS
  Administered 2020-06-12 (×4): 2 [IU] via SUBCUTANEOUS
  Administered 2020-06-13 (×2): 3 [IU] via SUBCUTANEOUS
  Administered 2020-06-13: 2 [IU] via SUBCUTANEOUS
  Administered 2020-06-13: 3 [IU] via SUBCUTANEOUS
  Administered 2020-06-14 (×3): 2 [IU] via SUBCUTANEOUS
  Administered 2020-06-14: 3 [IU] via SUBCUTANEOUS
  Administered 2020-06-15: 2 [IU] via SUBCUTANEOUS
  Administered 2020-06-15: 3 [IU] via SUBCUTANEOUS
  Administered 2020-06-16 (×2): 2 [IU] via SUBCUTANEOUS
  Administered 2020-06-16: 1 [IU] via SUBCUTANEOUS
  Administered 2020-06-17: 2 [IU] via SUBCUTANEOUS
  Administered 2020-06-17 (×2): 3 [IU] via SUBCUTANEOUS
  Administered 2020-06-17: 2 [IU] via SUBCUTANEOUS
  Administered 2020-06-18: 3 [IU] via SUBCUTANEOUS

## 2020-06-04 NOTE — Progress Notes (Signed)
Patient ID: [name] [name], male   DOB: 07/18/1927, 86 y.o.   MRN: [id number] EVENING ROUNDS NOTE :     [address]       [place] [address]             [phone]                 12 Days Post-Op Procedure(s) (LRB): AORTIC VALVE REPLACEMENT (AVR) USING EDWARDS RESILIA 29 MM AORTIC VALVE. (N/A) CORONARY ARTERY BYPASS GRAFTING (CABG) USING LIMA to LAD; ENDOSCOPIC HARVESTED RIGHT GREATER SAPHENOUS VEIN: SVG to PD; SVG to SEQUENCED INTERMEDIATE to OM (N/A) TRANSESOPHAGEAL ECHOCARDIOGRAM (TEE) (N/A) ENDOVEIN HARVEST OF GREATER SAPHENOUS VEIN (Right)  Total Length of Stay:  LOS: 19 days  BP 109/82   Pulse 79   Temp 98 F (36.7 C) (Oral)   Resp (!) 23   Ht 5\' 9"  (1.753 m)   Wt 74.8 kg   SpO2 96%   BMI 24.35 kg/m   .Intake/Output      12/07 0701 - 12/08 0700 12/08 0701 - 12/09 0700   P.O. 795 240   I.V. (mL/kg)     Other 0    Total Intake(mL/kg) 795 (10.6) 240 (3.2)   Urine (mL/kg/hr) 850 (0.5) 240 (0.3)   Total Output 850 240   Net -55 0          . feeding supplement (OSMOLITE 1.5 CAL)       Lab Results  Component Value Date   WBC 10.2 06/02/2020   HGB 9.3 (L) 06/02/2020   HCT 29.0 (L) 06/02/2020   PLT 236 06/02/2020   GLUCOSE 144 (H) 06/04/2020   ALT 20 06/02/2020   AST 26 06/02/2020   NA 140 06/04/2020   K 4.7 06/04/2020   CL 102 06/04/2020   CREATININE 1.51 (H) 06/04/2020   BUN 33 (H) 06/04/2020   CO2 28 06/04/2020   TSH 0.94 10/02/2012   INR 1.3 (H) 05/23/2020   HGBA1C 6.2 (H) 05/16/2020   Cr 1.5 Stable  day    Grace Isaac MD  Beeper [phone] Office [phone] 06/04/2020 5:24 PM

## 2020-06-04 NOTE — Progress Notes (Signed)
Occupational Therapy Treatment Patient Details Name: [name] [name] MRN: [id number] DOB: 05-14-1928 Today's Date: 06/04/2020    History of present illness 86 yo male presenting to PCP with worsening LE edema and dyspnea. S/p right/left heart cath and coronary angiography on 11/22. S/p CABG x4 on 11/26. PMH including venous insufficiency with chronic lower extremity edema (mostly on the left), hypertension, hyperlipidemia, peripheral neuropathy, Bil TKA,  essential tremor, and prostate cancer.   OT comments  Patient continues to struggle with the deficits listed below.  He was able to stand times two with Mod A for up to 20 seconds each time.  Patient cued to bring his weight forward, and able to briefly let go of RW with one hand at a time to increased independence with pant management and peri care in standing.  OT then worked on bringing his left and right legs up a figure four position while seated to increased lower body ADL.  Vital signs monitored throughout, and remained stable.  OT will continue to follow in the acute setting.  SNF post acute is recommended.    Follow Up Recommendations  SNF    Equipment Recommendations  3 in 1 bedside commode    Recommendations for Other Services      Precautions / Restrictions Precautions Precautions: Fall;Sternal Restrictions Other Position/Activity Restrictions: sternal       Mobility Bed Mobility                  Transfers     Transfers: Sit to/from Stand Sit to Stand: Mod assist              Balance         Postural control: Posterior lean Standing balance support: Bilateral upper extremity supported Standing balance-Leahy Scale: Poor                             ADL either performed or assessed with clinical judgement   ADL       Grooming: Supervision/safety;Set up;Sitting                                                                                                          Exercises General Exercises - Upper Extremity Shoulder Flexion: AAROM;Both;Seated;10 reps   Shoulder Instructions       General Comments      Pertinent Vitals/ Pain       Pain Assessment: Faces Faces Pain Scale: Hurts little more Pain Location: chest surgical site Pain Descriptors / Indicators: Tender;Burning Pain Intervention(s): Monitored during session                                                          Frequency  Min 2X/week        Progress Toward Goals  OT Goals(current goals can now be found in the care plan  section)  Progress towards OT goals: Progressing toward goals  Acute Rehab OT Goals Patient Stated Goal: I know I need to get stronger OT Goal Formulation: With patient Time For Goal Achievement: 06/09/20 Potential to Achieve Goals: [place]  Plan Discharge plan remains appropriate    Co-evaluation                 AM-PAC OT "6 Clicks" Daily Activity     Outcome Measure   Help from another person eating meals?: A Little Help from another person taking care of personal grooming?: A Little Help from another person toileting, which includes using toliet, bedpan, or urinal?: A Lot Help from another person bathing (including washing, rinsing, drying)?: A Lot Help from another person to put on and taking off regular upper body clothing?: A Lot Help from another person to put on and taking off regular lower body clothing?: A Lot 6 Click Score: 14    End of Session Equipment Utilized During Treatment: Gait belt;Rolling walker;Oxygen  OT Visit Diagnosis: Unsteadiness on feet (R26.81);Other abnormalities of gait and mobility (R26.89);Muscle weakness (generalized) (M62.81);Pain   Activity Tolerance Patient limited by fatigue   Patient Left in chair;with call bell/phone within reach   Nurse Communication Other (comment) (clear treatment)        Time: 1030-1105 OT Time Calculation (min): 35  min  Charges: OT General Charges $OT Visit: 1 Visit OT Treatments $Self Care/Home Management : 23-37 mins  06/04/2020  Rich, OTR/L  Acute Rehabilitation Services  Office:  [place] 06/04/2020, 12:59 PM

## 2020-06-04 NOTE — Progress Notes (Signed)
Calorie Count Note  48 hour calorie count ordered. Spoke with pt. Pt reports very little appetite. Pt stated he drinks Ensure throughout the day but is unsure of how many a day. He also likes to eat orange sherbet. Denies eating Magic cups. Other than this, pt could not provide a history of what he has been eating while admitted.  No documented meal completion records since 12/3. Per RN, pt has had ongoing poor po intake but is able to drink two Ensures a day.   Diet: Heart Healthy/Thin liquids Supplements: Ensure Enlive BID, Magic cup BID  12/6:  Breakfast: no data Lunch: 33 kcal, 0 grams protein Dinner: no data Supplements: 350 kcal, 20 grams protien  Total daily intake: 383 kcal (18% of minimum estimated needs)  20 grams protein (18% of minimum estimated needs)  12/7 Breakfast: no data Lunch: 256 kcal, 2 grams protein Dinner: no data Supplements: 350 kcal, 20 grams protein  Total daily intake: 606 kcal (29% of minimum estimated needs)  22 grams protein (20% of minimum estimated needs)  12/8 Breakfast: 11 kcal, 1 gram protein  Reported average daily intake:  500 kcal 35 grams protein   Nutrition Dx: Non-severe (moderate) malnutrition in context of chronic illness  Goal: Goal: Patient will meet greater than or equal to 90% of their needs  Intervention:   Recommend beginning continuous TF via Cortrak to meet calorie and protein needs.   Liberalize diet to regular to encourage intake with goal of weaning off TF.    Ronnald Nian, Dietetic Intern Pager: [phone] If unavailable: [phone]

## 2020-06-04 NOTE — Procedures (Signed)
Cortrak  Person Inserting Tube:  Donyale Falcon M, RD Tube Type:  Cortrak - 43 inches Tube Location:  Left nare Initial Placement:  Stomach Secured by: Bridle Technique Used to Measure Tube Placement:  Documented cm marking at nare/ corner of mouth Cortrak Secured At:  68 cm Procedure Comments:  Cortrak Tube Team Note:  Consult received to place a Cortrak feeding tube.   X-ray is required, abdominal x-ray has been ordered by the Cortrak team. Please confirm tube placement before using the Cortrak tube.   If the tube becomes dislodged please keep the tube and contact the Cortrak team at [email or web address] ([email or web address]) for replacement.  If after hours and replacement cannot be delayed, place a NG tube and confirm placement with an abdominal x-ray.      Sheala Dosh, MS, RD, LDN, CNSC Inpatient Clinical Dietitian RD pager # available in AMION  After hours/weekend pager # available in AMION      

## 2020-06-04 NOTE — Progress Notes (Signed)
Advanced Heart Failure Rounding Note  PCP-Cardiologist: Peter Martinique, MD   Subjective:    Off NE and dopamine.  Feels tired and uncomfortable. Not eating well. Very weak. Amio stopped this am due to bradycardia. Pacing resumed.   Co-ox 67% Creatinine stable at 150.   Echo 12/1  LVEF 30-35% (unchanged). RV systolic function mildly reduced. No pericardial effusion. No aortic valve prosthesis dysfunction.    Objective:   Weight Range: 74.8 kg Body mass index is 24.35 kg/m.   Vital Signs:   Temp:  [97.2 F (36.2 C)-99 F (37.2 C)] 97.8 F (36.6 C) (12/08 0752) Pulse Rate:  [54-91] 82 (12/08 0900) Resp:  [14-30] 18 (12/08 0900) BP: (88-114)/(61-77) 88/70 (12/08 0900) SpO2:  [87 %-100 %] 100 % (12/08 0900) FiO2 (%):  [40 %] 40 % (12/07 2005) Weight:  [74.8 kg] 74.8 kg (12/08 0500) Last BM Date: 05/30/20  Weight change: Filed Weights   06/01/20 0500 06/02/20 0630 06/04/20 0500  Weight: 78.8 kg 77 kg 74.8 kg    Intake/Output:   Intake/Output Summary (Last 24 hours) at 06/04/2020 1007 Last data filed at 06/04/2020 0900 Gross per 24 hour  Intake 795 ml  Output 850 ml  Net -55 ml      Physical Exam   General:  Elderly male sitting in chair. Weak appearing. No resp difficulty HEENT: normal Neck: supple. JVP 7-8Carotids 2+ bilat; no bruits. No lymphadenopathy or thryomegaly appreciated. Cor: PMI nondisplaced. Regular rate & rhythm. No rubs, gallops or murmurs. Lungs: clear Abdomen: soft, nontender, nondistended. No hepatosplenomegaly. No bruits or masses. Good bowel sounds. Extremities: no cyanosis, clubbing, rash, trace edema Neuro: alert & orientedx3, cranial nerves grossly intact. moves all 4 extremities w/o difficulty. Affect pleasant   Telemetry   V pacing 80 Personally reviewed    Labs    CBC Recent Labs    06/02/20 0331  WBC 10.2  HGB 9.3*  HCT 29.0*  MCV 98.6  PLT 016   Basic Metabolic Panel Recent Labs    06/02/20 0331 06/02/20 0331  06/03/20 0411 06/04/20 0345  NA 140   < > 142 140  K 3.9   < > 4.0 4.7  CL 98   < > 101 102  CO2 33*   < > 33* 28  GLUCOSE 144*   < > 139* 144*  BUN 29*   < > 29* 33*  CREATININE 1.63*   < > 1.56* 1.51*  CALCIUM 9.1   < > 9.2 9.2  MG 2.6*  --   --   --    < > = values in this interval not displayed.   Liver Function Tests Recent Labs    06/02/20 0331  AST 26  ALT 20  ALKPHOS 61  BILITOT 1.2  PROT 6.0*  ALBUMIN 3.1*   No results for input(s): LIPASE, AMYLASE in the last 72 hours. Cardiac Enzymes No results for input(s): CKTOTAL, CKMB, CKMBINDEX, TROPONINI in the last 72 hours.  BNP: BNP (last 3 results) Recent Labs    11/19/19 1550 05/16/20 1736  BNP 1,139.7* 2,005.2*    ProBNP (last 3 results) Recent Labs    01/02/20 1134  PROBNP 1,167.0*     D-Dimer No results for input(s): DDIMER in the last 72 hours. Hemoglobin A1C No results for input(s): HGBA1C in the last 72 hours. Fasting Lipid Panel No results for input(s): CHOL, HDL, LDLCALC, TRIG, CHOLHDL, LDLDIRECT in the last 72 hours. Thyroid Function Tests No results for input(s): TSH, T4TOTAL,  T3FREE, THYROIDAB in the last 72 hours.  Invalid input(s): FREET3  Other results:   Imaging    DG CHEST PORT 1 VIEW  Result Date: 06/04/2020 CLINICAL DATA:  Shortness of breath.  Open heart surgery EXAM: PORTABLE CHEST 1 VIEW COMPARISON:  Two days ago FINDINGS: Cardiomegaly. There has been CABG and aortic valve replacement. Hazy opacity at the bases with pulmonary opacity and pleural effusion, increased at the right base. No pneumothorax. Right PICC with tip at the SVC. IMPRESSION: Lower chest opacity from pleural effusion and presumed atelectasis, progressed on the right since study 2 days ago. Electronically Signed   By: Monte Fantasia M.D.   On: 06/04/2020 07:37     Medications:     Scheduled Medications: . aspirin EC  81 mg Oral Daily  . Chlorhexidine Gluconate Cloth  6 each Topical Daily  . docusate  sodium  200 mg Oral Daily  . escitalopram  5 mg Oral Daily  . feeding supplement  237 mL Oral BID BM  . ferrous [id number] C-folic acid  1 capsule Oral BID PC  . insulin aspart  0-15 Units Subcutaneous TID WC  . mouth rinse  15 mL Mouth Rinse BID  . midodrine  10 mg Oral TID  . multivitamin with minerals  1 tablet Oral Daily  . pantoprazole  40 mg Oral Daily    Infusions:   PRN Medications: melatonin, ondansetron (ZOFRAN) IV, sodium chloride flush, traMADol   Assessment/Plan   1. Severe 3V CAD: - s/p CABGx 4 (LIMA-LAD, sequential SVG-OM1 and OM2, SVG- PDA) - no s/s ischemia - continue ASA 81 - no ? blocker w/ post-cardiotomy shock and low BP - Atorva started 12/7 and stopped 12/8. Doubt this is contributing to his weakness but ok to hold   2. Aortic Stenosis: - severe low flow low gradient AS - s/p tissue AVR 11/26  - AV prosthesis ok on post op echo - stable  3. Acute on Chronic Biventricular Heart Failure:  - Echo in 2014 showed normal LVEF 55-60% w/ G1DD.  - Echo 10/21 and EF 30-35% w/ global HK. RV systolic function mildly reduced. - LHC this admit w/ severe 3V CAD>>ICM. Now s/p CABG. Also w/ severe AS, possibly contributing to LV dysfunction, now s/p SAVR - post op recovery c/b difficulties weaning pressors/inotropes.  - Now off pressors. On midodrine for BP support - CVP 5. Co-ox 67% - Post-op echo 12/1 EF 30-35%, RV mildly reduced. AV prothesis ok  - Volume status ok. Weight decreasing Continue to hold diuretics -  Continue UNNA - hypotension limits use of ARNi/ARB and spiro   4. Post Operative Atrial Fibrillation:   - continues w/ temp pacing wires, currently A-V paced -> I turned pacer off NSR 70s with long AVB and occasional PVCs - defer a/c to CT surgery    5. AKI:   - Baseline <1.0.  - Creatinine stable at 1.5 Holding diuretics as above.     6. Hyponatremia - resolved  7. Anemia - expected ABLA from surgery  - Hgb stable  - on PO  Fe   8. Debility - Major issue currently. PT following.  - Recommending SNF.    9. Urinary Retention Prior to admit I&O cath at home.  - Has foley. UA negative  Growing weaker every day despite optimization of hemodynamics and volume status. It is really now a failure to thrive issue. I think the only thing left to try is reinstituting TFs (with Cor-Trak) and potentially considering  marinol. If not turning around in the next 2-3 days doubt he will ever turn the corner.   Glori Bickers, MD  10:27 AM    Length of Stay: [place], MD  06/04/2020, 10:07 AM  Advanced Heart Failure Team Pager [phone] (M-F; 7a - 4p)  Please contact [facility] for night-coverage after hours (4p -7a ) and weekends on [email or web address]   10:07 AM

## 2020-06-04 NOTE — Progress Notes (Signed)
Nutrition Follow-up  DOCUMENTATION CODES:   Non-severe (moderate) malnutrition in context of chronic illness  INTERVENTION:   Begin continuous TF via Cortrak:  Osmolite 1.5 start at 20 mL/hr and increase rate by 10 mL q 8 hours until goal of 50 mL/hr is reached.   PROsource TF 45 mL BID  This regimen provides 1880 kcals, 97 grams of protein, and 912 mL of free water. This regimen meets 100% of estimated needs.   Once tolerating TF at goal rate, plan to transition to nocturnal TF.    MVI with minerals  Ensure Enlive po BID, each supplement provides 350 kcal and 20 grams of protein  Liberalize diet to regular  D/C Magic Cup as pt not eating  NUTRITION DIAGNOSIS:   Moderate Malnutrition related to chronic illness (CHF, LLE edema) as evidenced by mild fat depletion, severe muscle depletion, moderate muscle depletion.  Ongoing   GOAL:   Patient will meet greater than or equal to 90% of their needs  Ongoing  MONITOR:   PO intake, Supplement acceptance, Labs, Weight trends, Skin  REASON FOR ASSESSMENT:   Consult Enteral/tube feeding initiation and management  ASSESSMENT:   Pt with PMH of HTN, HLD, chronic LLE, peripheral neuropathy, essential tremor, and prostate cancer. Presented with CHF exacerbation and severe low flow AS. S/p right/left heart cath and coronary angiography on 11/22. Underwent CABG x4 and tissue AVR on 11/26.  48 hour calorie count completed. Pt continues to have poor po intake per calorie count. Average daily intake is 500 kcal and 35 grams of protein. Spoke with pt. Pt reports very little appetite. Pt stated he drinks Ensure throughout the day but is unsure of how many a day. He also likes to eat orange sherbet. Other than this, pt could not provide a history of what he has been eating while admitted. No documented meal completion records since 12/3. Per RN, pt has had ongoing poor po intake but is able to drink 2 Ensures a day.   MD placed orders to  begin TF via Cortrak to meet patients estimated needs since po intake is consistently poor. Noted pt also started on Marinol today.   Noted pt has not had a BM in 5 days but is on a scheduled bowel regimen. If continues without BM, recommend adjusting bowel regimen.   Current weight: 74.8 kg, lowest weight since admission. Net negative 9 L. Admit weight 83 kg. Pt continues with 2+ edema in LE, unsure of dry weight.   Labs reviewed: Glucose 144  Medications reviewed and include: torsemide, dolcolax, colace, SSI, MVI with minerals, protonix, trinsicon/foltrin, marinol   Diet Order:   Diet Order            Diet Heart Room service appropriate? Yes; Fluid consistency: Thin  Diet effective now                 EDUCATION NEEDS:   Education needs have been addressed  Skin:  Skin Assessment: Skin Integrity Issues: Skin Integrity Issues:: Stage II Stage II: sacrum  Last BM:  05/30/2020  Height:   Ht Readings from Last 1 Encounters:  05/23/20 5\' 9"  (1.753 m)    Weight:   Wt Readings from Last 1 Encounters:  06/04/20 74.8 kg    Ideal Body Weight:  72.7 kg  BMI:  Body mass index is 24.35 kg/m.  Estimated Nutritional Needs:   Kcal:  1700-1900  Protein:  90-110  Fluid:  >/= 2 L/day    Ronnald Nian, Dietetic  Intern Pager: [phone] If unavailable: [phone]

## 2020-06-04 NOTE — TOC Progression Note (Signed)
Transition of Care [facility]) - Progression Note    Patient Details  Name: [name] [name] MRN: [id number] Date of Birth: 1928-04-07  Transition of Care [facility]) CM/SW [place], [place] Phone Number: 06/04/2020, 4:43 PM  Clinical Narrative:     CSW awaiting callback from spouse to update on current SNF bed offer.  Patient has cortrak.CSW will continue to follow.    Expected Discharge Plan: [place] Barriers to Discharge: Continued Medical Work up  Expected Discharge Plan and Services Expected Discharge Plan: [place] arrangements for the past 2 months: Single Family Home                                       Social Determinants of Health (SDOH) Interventions    Readmission Risk Interventions No flowsheet data found.

## 2020-06-04 NOTE — Progress Notes (Signed)
12 Days Post-Op Procedure(s) (LRB): AORTIC VALVE REPLACEMENT (AVR) USING EDWARDS RESILIA 29 MM AORTIC VALVE. (N/A) CORONARY ARTERY BYPASS GRAFTING (CABG) USING LIMA to LAD; ENDOSCOPIC HARVESTED RIGHT GREATER SAPHENOUS VEIN: SVG to PD; SVG to SEQUENCED INTERMEDIATE to OM (N/A) TRANSESOPHAGEAL ECHOCARDIOGRAM (TEE) (N/A) ENDOVEIN HARVEST OF GREATER SAPHENOUS VEIN (Right) Subjective:  Feels tired, weak and uncomfortable.  Objective: Vital signs in last 24 hours: Temp:  [96.8 F (36 C)-99 F (37.2 C)] 99 F (37.2 C) (12/08 0025) Pulse Rate:  [35-91] 54 (12/08 0700) Cardiac Rhythm: Normal sinus rhythm (12/08 0000) Resp:  [14-30] 24 (12/08 0700) BP: (91-114)/(61-77) 91/65 (12/08 0700) SpO2:  [87 %-100 %] 90 % (12/08 0700) FiO2 (%):  [40 %] 40 % (12/07 2005) Weight:  [74.8 kg] 74.8 kg (12/08 0500)  Hemodynamic parameters for last 24 hours: CVP:  [3 mmHg-15 mmHg] 7 mmHg  Intake/Output from previous day: 12/07 0701 - 12/08 0700 In: 795 [P.O.:795] Out: 850 [Urine:850] Intake/Output this shift: No intake/output data recorded.  General appearance: fatigued Neurologic: intact Heart: regular rate and rhythm, S1, S2 normal, no murmur Lungs: diminished breath sounds bibasilar Abdomen: soft, non-tender; bowel sounds normal Extremities: edema none Wound: incision ok  Lab Results: Recent Labs    06/02/20 0331  WBC 10.2  HGB 9.3*  HCT 29.0*  PLT 236   BMET:  Recent Labs    06/03/20 0411 06/04/20 0345  NA 142 140  K 4.0 4.7  CL 101 102  CO2 33* 28  GLUCOSE 139* 144*  BUN 29* 33*  CREATININE 1.56* 1.51*  CALCIUM 9.2 9.2    PT/INR: No results for input(s): LABPROT, INR in the last 72 hours. ABG    Component Value Date/Time   PHART 7.381 05/26/2020 1750   HCO3 22.2 05/26/2020 1750   TCO2 23 05/26/2020 1750   ACIDBASEDEF 2.0 05/26/2020 1750   O2SAT 66.6 06/04/2020 0346   CBG (last 3)  Recent Labs    06/03/20 1539 06/03/20 2151 06/04/20 0619  GLUCAP 137* 117* 160*    CXR: bibasilar atelectasis and small pleural effusions.  Assessment/Plan: S/P Procedure(s) (LRB): AORTIC VALVE REPLACEMENT (AVR) USING EDWARDS RESILIA 29 MM AORTIC VALVE. (N/A) CORONARY ARTERY BYPASS GRAFTING (CABG) USING LIMA to LAD; ENDOSCOPIC HARVESTED RIGHT GREATER SAPHENOUS VEIN: SVG to PD; SVG to SEQUENCED INTERMEDIATE to OM (N/A) TRANSESOPHAGEAL ECHOCARDIOGRAM (TEE) (N/A) ENDOVEIN HARVEST OF GREATER SAPHENOUS VEIN (Right)  POD 12 Hemodynamically stable off IV pressors but still on midodrine 10 tid.   Rhythm this am is either junctional or sinus with very long PR interval 50's. Will AV pace and stop amiodarone. Amio could also be contributing to his weakness, malaise.  Wt is 1 lb below preop. Hold off on any further diuretic.  His biggest issue now is generalized weakness and malaise, not feeling strong enough to do anything. I am not sure if this is just his advanced age with a long period of immobilization and surgery or something else. Stop amio and Lipitor. His wife says he has had some weakness in past on statins. Continue PT/OT.  I think he needs to stay in ICU for now. He requires too much attention for progressive care.   LOS: 19 days    Gaye Pollack 06/04/2020

## 2020-06-05 LAB — BASIC METABOLIC PANEL
Anion gap: 10 (ref 5–15)
BUN: 41 mg/dL — ABNORMAL HIGH (ref 8–23)
CO2: 29 mmol/L (ref 22–32)
Calcium: 9.1 mg/dL (ref 8.9–10.3)
Chloride: 102 mmol/L (ref 98–111)
Creatinine, Ser: 1.58 mg/dL — ABNORMAL HIGH (ref 0.61–1.24)
GFR, Estimated: 41 mL/min — ABNORMAL LOW (ref >60)
Glucose, Bld: 113 mg/dL — ABNORMAL HIGH (ref 70–99)
Potassium: 4.2 mmol/L (ref 3.5–5.1)
Sodium: 141 mmol/L (ref 135–145)

## 2020-06-05 LAB — COOXEMETRY PANEL
Carboxyhemoglobin: 1 % (ref 0.5–1.5)
Carboxyhemoglobin: 1.1 % (ref 0.5–1.5)
Carboxyhemoglobin: 1.1 % (ref 0.5–1.5)
Methemoglobin: 0.8 % (ref 0.0–1.5)
Methemoglobin: 1.1 % (ref 0.0–1.5)
Methemoglobin: 0.8 % (ref 0.0–1.5)
O2 Saturation: 43.7 %
O2 Saturation: 46 %
O2 Saturation: 54.3 %
Total hemoglobin: 9.9 g/dL — ABNORMAL LOW (ref 12.0–16.0)
Total hemoglobin: 9.9 g/dL — ABNORMAL LOW (ref 12.0–16.0)
Total hemoglobin: 11.7 g/dL — ABNORMAL LOW (ref 12.0–16.0)

## 2020-06-05 LAB — GLUCOSE, CAPILLARY
Glucose-Capillary: 106 mg/dL — ABNORMAL HIGH (ref 70–99)
Glucose-Capillary: 120 mg/dL — ABNORMAL HIGH (ref 70–99)
Glucose-Capillary: 142 mg/dL — ABNORMAL HIGH (ref 70–99)
Glucose-Capillary: 159 mg/dL — ABNORMAL HIGH (ref 70–99)
Glucose-Capillary: 214 mg/dL — ABNORMAL HIGH (ref 70–99)
Glucose-Capillary: 232 mg/dL — ABNORMAL HIGH (ref 70–99)

## 2020-06-05 LAB — MAGNESIUM
Magnesium: 2.5 mg/dL — ABNORMAL HIGH (ref 1.7–2.4)
Magnesium: 2.4 mg/dL (ref 1.7–2.4)

## 2020-06-05 LAB — PHOSPHORUS
Phosphorus: 3.3 mg/dL (ref 2.5–4.6)
Phosphorus: 2.8 mg/dL (ref 2.5–4.6)

## 2020-06-05 MED ORDER — GERHARDT'S BUTT CREAM
TOPICAL_CREAM | CUTANEOUS | Status: DC | PRN
  Filled 2020-06-05 (×2): qty 1

## 2020-06-05 MED ORDER — SODIUM CHLORIDE 0.9 % IV SOLN
INTRAVENOUS | Status: DC | PRN
  Administered 2020-06-05: 10 mL via INTRAVENOUS

## 2020-06-05 MED ORDER — BISACODYL 10 MG RE SUPP
10.0000 mg | Freq: Every day | RECTAL | Status: DC | PRN
  Administered 2020-06-25: 10 mg via RECTAL
  Filled 2020-06-05 (×2): qty 1

## 2020-06-05 MED ORDER — TRAMADOL HCL 50 MG PO TABS
50.0000 mg | ORAL_TABLET | Freq: Two times a day (BID) | ORAL | Status: DC | PRN
  Administered 2020-06-07 – 2020-06-14 (×4): 50 mg via ORAL
  Filled 2020-06-05 (×6): qty 1

## 2020-06-05 MED ORDER — DOBUTAMINE IN D5W 4-5 MG/ML-% IV SOLN
2.5000 ug/kg/min | INTRAVENOUS | Status: DC
  Administered 2020-06-05: 2.5 ug/kg/min via INTRAVENOUS
  Filled 2020-06-05: qty 250

## 2020-06-05 NOTE — Progress Notes (Signed)
Orthopedic Tech Progress Note Patient Details:  [name] [name] May 04, 1928 [id number]  Ortho Devices Type of Ortho Device: Haematologist Ortho Device/Splint Location: Bilateral Ortho Device/Splint Interventions: Application,Ordered   Post Interventions Patient Tolerated: Well Instructions Provided: Care of device,Poper ambulation with device   [name] A Mazen Marcin 06/05/2020, 4:23 PM

## 2020-06-05 NOTE — Progress Notes (Addendum)
Physical Therapy Treatment Patient Details Name: [name] [name] MRN: [id number] DOB: 12-Dec-1927 Today's Date: 06/05/2020    History of Present Illness 86 yo male presenting to PCP with worsening LE edema and dyspnea. S/p right/left heart cath and coronary angiography on 11/22. S/p CABG x4 on 11/26. PMH including venous insufficiency with chronic lower extremity edema (mostly on the left), hypertension, hyperlipidemia, peripheral neuropathy, Bil TKA,  essential tremor, and prostate cancer.    PT Comments    Pt admitted with above diagnosis. Pt was able to ambulate a short distance today with [name] walker with +2 mod assist. Pt very fatigued on arrival today and pt on 8LO2 on arrival. Pt only able to walk to door and back to bed and was exhausted.  Assisted pt to bed as he had been up since early am. Goals updated as pt had met 2/4 goals.   Will follow acutely.   Pt currently with functional limitations due to balance and endurance deficits. Pt will benefit from skilled PT to increase their independence and safety with mobility to allow discharge to the venue listed below.     Follow Up Recommendations  SNF     Equipment Recommendations  None recommended by PT (has RW at home)    Recommendations for Other Services       Precautions / Restrictions Precautions Precautions: Fall;Sternal Precaution Comments: Reviewed sternal precautions Restrictions Other Position/Activity Restrictions: sternal    Mobility  Bed Mobility Overal bed mobility: Needs Assistance Bed Mobility: Sit to Supine       Sit to supine: Mod assist;Max assist   General bed mobility comments: Pt needed mod to max assist to get LEs into bed.  Transfers Overall transfer level: Needs assistance Equipment used: Ambulation equipment used (EVA walker) Transfers: Sit to/from Stand Sit to Stand: Mod assist;+2 physical assistance         General transfer comment: Needs mod assist to power up and steadying assist but  once up, incr steadiness with UE support pm [name] walker.  Ambulation/Gait Ambulation/Gait assistance: +2 safety/equipment;Mod assist;Min assist Gait Distance (Feet): 35 Feet Assistive device:  (EVA walker) Gait Pattern/deviations: Step-through pattern;Trunk flexed;Decreased stride length;Leaning posteriorly;Wide base of support;Drifts right/left Gait velocity: slowed Gait velocity interpretation: <1.31 ft/sec, indicative of household ambulator General Gait Details: Pt continues to require min to mod assistance for balance.  Pt with decr distance today and less steady as he progressed with his walking.  Pt required close chair follow for safety this session.  Educated on posture and  hip extension as pt flexes at trunk. Fatigues quickly needing close chair follow. Pt also on incr O2 today needing 8LO2 at rest.  Sats registering at >87% with activity on 8LO2 with DOE 3/4. Pt appeared exhausted at end of walk.   Stairs             Wheelchair Mobility    Modified Rankin (Stroke Patients Only)       Balance Overall balance assessment: Needs assistance Sitting-balance support: No upper extremity supported;Feet supported Sitting balance-Leahy Scale: Fair     Standing balance support: Bilateral upper extremity supported Standing balance-Leahy Scale: Poor Standing balance comment: requires external assist and B UE to maintain standing balance                            Cognition Arousal/Alertness: Awake/alert Behavior During Therapy: WFL for tasks assessed/performed Overall Cognitive Status: Within Functional Limits for tasks assessed  Exercises General Exercises - Lower Extremity Ankle Circles/Pumps: AROM;Both;10 reps;Supine Heel Slides: AROM;Both;10 reps;Supine    General Comments General comments (skin integrity, edema, etc.): HR stable but incr O2 needs on arrival today.      Pertinent Vitals/Pain Pain  Assessment: Faces Faces Pain Scale: Hurts even more Pain Location: chest surgical site Pain Descriptors / Indicators: Tender;Burning Pain Intervention(s): Limited activity within patient's tolerance;Monitored during session;Repositioned    Home Living                      Prior Function            PT Goals (current goals can now be found in the care plan section) Acute Rehab PT Goals PT Goal Formulation: With patient Time For Goal Achievement: 06/19/20 Potential to Achieve Goals: Fair Progress towards PT goals: Progressing toward goals    Frequency    Min 2X/week      PT Plan Current plan remains appropriate    Co-evaluation              AM-PAC PT "6 Clicks" Mobility   Outcome Measure  Help needed turning from your back to your side while in a flat bed without using bedrails?: A Little Help needed moving from lying on your back to sitting on the side of a flat bed without using bedrails?: A Lot Help needed moving to and from a bed to a chair (including a wheelchair)?: A Lot Help needed standing up from a chair using your arms (e.g., wheelchair or bedside chair)?: A Lot Help needed to walk in hospital room?: A Lot Help needed climbing 3-5 steps with a railing? : A Lot 6 Click Score: 13    End of Session Equipment Utilized During Treatment: Oxygen;Gait belt Activity Tolerance: Patient limited by fatigue Patient left: with call bell/phone within reach;in bed;with bed alarm set Nurse Communication: Mobility status PT Visit Diagnosis: Muscle weakness (generalized) (M62.81);Difficulty in walking, not elsewhere classified (R26.2);Other abnormalities of gait and mobility (R26.89);Pain Pain - part of body:  (sternum)     Time: 7225-7505 PT Time Calculation (min) (ACUTE ONLY): 20 min  Charges:  $Gait Training: 8-22 mins                     Breanna Shorkey W,PT [place] Pager:  [phone]  Office:  [place] 06/05/2020, 12:04 PM

## 2020-06-05 NOTE — Progress Notes (Signed)
Pt remains very depressed and unmotivated. Pt was up in recliner for 4 hours this am and has not gotten out of bed rest of day.pt has only eaten half orange sherbert and drank 2 ensures. Pt complains of bloating and constipation. I administered a suppository with no results. Digital exam revealed large soft stool burden in rectum. Pt unable to push out. While attempting to sit patient on side of bed in order to get on [facility], he started screaming he was dizzy and jerked himself backwards on the bed pulling RN as well. Heart rate was 80,no change, bp no change, oxygen no change. After 2 minutes assisted patient back to bed.   Ellamae Sia

## 2020-06-05 NOTE — Progress Notes (Signed)
      [address]       [place] [address]             [phone]      PM rounds  POD # 13 AVR CABG  Sleeping currently  BP 90/63   Pulse 68   Temp (!) 97.4 F (36.3 C) (Oral)   Resp 17   Ht 5\' 9"  (1.753 m)   Wt 78 kg   SpO2 (!) 89%   BMI 25.39 kg/m   CVP= 10 Dobutamine at 2.5  859/300  Co-ox at noon 54 up from 46  Continue current Rx  Leida Luton C. Roxan Hockey, MD [facility] [phone]

## 2020-06-05 NOTE — Progress Notes (Addendum)
Advanced Heart Failure Rounding Note  PCP-Cardiologist: Peter Martinique, [name] [name]   Subjective:    CO-OX 44>46% on repeat today   Yesterday started on marinol and cortrak placed.   Complaining of fatigue. Not hungry. Complaining of abdominal bloating.   Echo 12/1  LVEF 30-35% (unchanged). RV systolic function mildly reduced. No pericardial effusion. No aortic valve prosthesis dysfunction.    Objective:   Weight Range: 78 kg Body mass index is 25.39 kg/m.   Vital Signs:   Temp:  [97.5 F (36.4 C)-98.2 F (36.8 C)] 97.5 F (36.4 C) (12/09 0349) Pulse Rate:  [73-85] 81 (12/09 0700) Resp:  [16-26] 25 (12/09 0700) BP: (87-143)/(63-87) 143/78 (12/09 0700) SpO2:  [88 %-100 %] 95 % (12/09 0700) Weight:  [78 kg] 78 kg (12/09 0500) Last BM Date: 05/30/20  Weight change: Filed Weights   06/02/20 0630 06/04/20 0500 06/05/20 0500  Weight: 77 kg 74.8 kg 78 kg    Intake/Output:   Intake/Output Summary (Last 24 hours) at 06/05/2020 0744 Last data filed at 06/05/2020 0500 Gross per 24 hour  Intake 620.83 ml  Output 615 ml  Net 5.83 ml      Physical Exam  General: Appears weak.  No resp difficulty HEENT: normal except Cortrak.  Neck: supple. no JVD. Carotids 2+ bilat; no bruits. No lymphadenopathy or thryomegaly appreciated. Cor: PMI nondisplaced. Regular rate & rhythm. No rubs, gallops or murmurs. Lungs: Decreased in the bases on 8 liters oxyge.  Abdomen: soft, nontender, nondistended. No hepatosplenomegaly. No bruits or masses. Good bowel sounds. Extremities: no cyanosis, clubbing, rash, edema Neuro: alert & orientedx3, cranial nerves grossly intact. moves all 4 extremities w/o difficulty. Affect flat.    Telemetry   V paced 80s     Labs    CBC No results for input(s): WBC, NEUTROABS, HGB, HCT, MCV, PLT in the last 72 hours. Basic Metabolic Panel Recent Labs    06/04/20 0345 06/04/20 1653 06/05/20 0334  NA 140  --  141  K 4.7  --  4.2  CL 102  --  102  CO2  28  --  29  GLUCOSE 144*  --  113*  BUN 33*  --  41*  CREATININE 1.51*  --  1.58*  CALCIUM 9.2  --  9.1  MG  --  2.7* 2.5*  PHOS  --  3.5 3.3   Liver Function Tests No results for input(s): AST, ALT, ALKPHOS, BILITOT, PROT, ALBUMIN in the last 72 hours. No results for input(s): LIPASE, AMYLASE in the last 72 hours. Cardiac Enzymes No results for input(s): CKTOTAL, CKMB, CKMBINDEX, TROPONINI in the last 72 hours.  BNP: BNP (last 3 results) Recent Labs    11/19/19 1550 05/16/20 1736  BNP 1,139.7* 2,005.2*    ProBNP (last 3 results) Recent Labs    01/02/20 1134  PROBNP 1,167.0*     D-Dimer No results for input(s): DDIMER in the last 72 hours. Hemoglobin A1C No results for input(s): HGBA1C in the last 72 hours. Fasting Lipid Panel No results for input(s): CHOL, HDL, LDLCALC, TRIG, CHOLHDL, LDLDIRECT in the last 72 hours. Thyroid Function Tests No results for input(s): TSH, T4TOTAL, T3FREE, THYROIDAB in the last 72 hours.  Invalid input(s): FREET3  Other results:   Imaging    DG Abd Portable 1V  Result Date: 06/04/2020 CLINICAL DATA:  Feeding tube placement EXAM: PORTABLE ABDOMEN - 1 VIEW COMPARISON:  June 04, 2020 FINDINGS: Signs of sternotomy and aortic valve replacement in the chest with effusions  and basilar airspace disease. Leads project over the abdomen. Placement of a feeding tube, tip in the gastric antrum/likely pre-pyloric. Mild distension of gas-filled loops of bowel throughout the abdomen. No acute skeletal process on limited assessment. IMPRESSION: 1. Placement of a feeding tube, tip in the gastric antrum/likely pre-pyloric. 2. Signs of effusions and basilar airspace disease. 3. Mild gas-filled loops of bowel throughout the abdomen. May represent ileus. Correlate with abdominal symptoms with follow-up radiographs as warranted. Electronically Signed   By: Zetta Bills M.D.   On: 06/04/2020 14:13     Medications:     Scheduled Medications: .  aspirin EC  81 mg Oral Daily  . Chlorhexidine Gluconate Cloth  6 each Topical Daily  . docusate sodium  200 mg Oral Daily  . dronabinol  5 mg Oral BID AC  . escitalopram  5 mg Oral Daily  . feeding supplement  237 mL Oral BID BM  . feeding supplement (PROSource TF)  45 mL Per Tube BID  . ferrous [id number] C-folic acid  1 capsule Oral BID PC  . insulin aspart  0-15 Units Subcutaneous Q4H  . mouth rinse  15 mL Mouth Rinse BID  . midodrine  10 mg Oral TID  . multivitamin with minerals  1 tablet Oral Daily  . pantoprazole  40 mg Oral Daily    Infusions: . feeding supplement (OSMOLITE 1.5 CAL) 30 mL/hr at 06/05/20 0500    PRN Medications: melatonin, ondansetron (ZOFRAN) IV, sodium chloride flush, traMADol   Assessment/Plan   1. Severe 3V CAD: - s/p CABGx 4 (LIMA-LAD, sequential SVG-OM1 and OM2, SVG- PDA) - No s/s ischemia  - continue ASA 81 - no ? blocker w/ post-cardiotomy shock and low BP - Atorva started 12/7 and stopped 12/8. Doubt this is contributing to his weakness but ok to hold   2. Aortic Stenosis: - severe low flow low gradient AS - s/p tissue AVR 11/26  - AV prosthesis ok on post op echo - stable  3. Acute on Chronic Biventricular Heart Failure:  - Echo in 2014 showed normal LVEF 55-60% w/ G1DD.  - Echo 10/21 and EF 30-35% w/ global HK. RV systolic function mildly reduced. - LHC this admit w/ severe 3V CAD>>ICM. Now s/p CABG. Also w/ severe AS, possibly contributing to LV dysfunction, now s/p SAVR - post op recovery c/b difficulties weaning pressors/inotropes. - Post-op echo 12/1 EF 30-35%, RV mildly reduced. AV prothesis ok  -He has been off pressors somce 06/02/20. On midodrine for BP support - CO-OX 46%. Will add 2.5 mcg dobutamine.  Check CO-OX at 1200 -  Continue UNNA - hypotension limits use of ARNi/ARB and spiro   4. Post Operative Atrial Fibrillation:   - continues w/ temp pacing wires, currently A-V paced 80s  - defer a/c to CT surgery     5. AKI:   - Baseline <1.0.  - Creatinine stable 1.5>1.6  6. Hyponatremia - resolved  7. Anemia - expected ABLA from surgery  -  on PO Fe  - Hgb on CO-OX 9.9.   8. Debility - Major issue currently. PT/OT following.  - Recommending SNF.   9. Urinary Retention Prior to admit I&O cath at home.  - Has foley.  -UA negative   [name] Grinder, [name] [name]  7:44 AM   Length of Stay: 20  Patient seen and examined with the above-signed Advanced Practice Provider and/or Housestaff. I personally reviewed laboratory data, imaging studies and relevant notes. I independently examined the patient and formulated  the important aspects of the plan. I have edited the note to reflect any of my changes or salient points. I have personally discussed the plan with the patient and/or family.  Remains weak and fatigued. Depressed but says his mind is willing but his body isn't. Cor-trak placed. TFs stared.   Co-ox low x 2 this am after pacer turned off. In sinus in 80s   General:  Elderly weak appearing. No resp difficulty HEENT: normal + cor=trak Neck: supple. no JVD. Carotids 2+ bilat; no bruits. No lymphadenopathy or thryomegaly appreciated. Cor: PMI nondisplaced. Regular rate & rhythm. No rubs, gallops or murmurs. Lungs: clear Abdomen: soft, nontender, nondistended. No hepatosplenomegaly. No bruits or masses. Good bowel sounds. Extremities: no cyanosis, clubbing, rash, edema Neuro: alert & orientedx3, cranial nerves grossly intact. moves all 4 extremities w/o difficulty. Affect pleasant  Continues to struggle. Co-ox back down. I considered watchful waiting but given that time is critical to salvaging him in the setting of severe FTT.debiliy due to his age I think it is reasonable to try short course of dobutamine to support him while we give TFs, PT/OT and marinol a change to work.   [name] Bickers, [name] [name]  8:09 AM

## 2020-06-05 NOTE — Progress Notes (Signed)
13 Days Post-Op Procedure(s) (LRB): AORTIC VALVE REPLACEMENT (AVR) USING EDWARDS RESILIA 29 MM AORTIC VALVE. (N/A) CORONARY ARTERY BYPASS GRAFTING (CABG) USING LIMA to LAD; ENDOSCOPIC HARVESTED RIGHT GREATER SAPHENOUS VEIN: SVG to PD; SVG to SEQUENCED INTERMEDIATE to OM (N/A) TRANSESOPHAGEAL ECHOCARDIOGRAM (TEE) (N/A) ENDOVEIN HARVEST OF GREATER SAPHENOUS VEIN (Right) Subjective: Still complains of feeling weak, bloated belly  Objective: Vital signs in last 24 hours: Temp:  [97.5 F (36.4 C)-98.2 F (36.8 C)] 97.5 F (36.4 C) (12/09 0700) Pulse Rate:  [73-85] 81 (12/09 0700) Cardiac Rhythm: A-V Sequential paced (12/09 0400) Resp:  [16-26] 25 (12/09 0700) BP: (87-143)/(63-87) 143/78 (12/09 0700) SpO2:  [88 %-100 %] 95 % (12/09 0700) Weight:  [78 kg] 78 kg (12/09 0500)  Hemodynamic parameters for last 24 hours: CVP:  [2 mmHg-12 mmHg] 8 mmHg  Intake/Output from previous day: 12/08 0701 - 12/09 0700 In: 620.8 [P.O.:390; NG/GT:230.8] Out: 615 [Urine:615] Intake/Output this shift: No intake/output data recorded.  General appearance: alert and fatigued Neurologic: intact Heart: regular rate and rhythm, S1, S2 normal, no murmur Lungs: diminished breath sounds bibasilar Abdomen: soft, non-tender; bowel sounds normal Extremities: no significant edema Wound: incision ok  Lab Results: No results for input(s): WBC, HGB, HCT, PLT in the last 72 hours. BMET: Recent Labs    06/04/20 0345 06/05/20 0334  NA 140 141  K 4.7 4.2  CL 102 102  CO2 28 29  GLUCOSE 144* 113*  BUN 33* 41*  CREATININE 1.51* 1.58*  CALCIUM 9.2 9.1    PT/INR: No results for input(s): LABPROT, INR in the last 72 hours. ABG    Component Value Date/Time   PHART 7.381 05/26/2020 1750   HCO3 22.2 05/26/2020 1750   TCO2 23 05/26/2020 1750   ACIDBASEDEF 2.0 05/26/2020 1750   O2SAT 46.0 06/05/2020 0647   CBG (last 3)  Recent Labs    06/04/20 2323 06/05/20 0344 06/05/20 0634  GLUCAP 132* 106* 159*     Assessment/Plan: S/P Procedure(s) (LRB): AORTIC VALVE REPLACEMENT (AVR) USING EDWARDS RESILIA 29 MM AORTIC VALVE. (N/A) CORONARY ARTERY BYPASS GRAFTING (CABG) USING LIMA to LAD; ENDOSCOPIC HARVESTED RIGHT GREATER SAPHENOUS VEIN: SVG to PD; SVG to SEQUENCED INTERMEDIATE to OM (N/A) TRANSESOPHAGEAL ECHOCARDIOGRAM (TEE) (N/A) ENDOVEIN HARVEST OF GREATER SAPHENOUS VEIN (Right)  POD 13 Hemodynamics stable but Co-ox this am was 44 and repeat 46. Not sure why since it has been 66 off pressors. sats are ok. Hgb was 9.3 on 12/6. Nothing has really changed.  Rhythm this am is sinus 80's. Pacer turned off. Will discuss with DB whether we should restart an inotrope or follow.   Tube feeds going at 30 and will advance to goal of 50.  I/O even but wt recorded is up 7 lbs from yesterday. Question accuracy.  Generalized weakness due to advanced age and deconditioning, possibly low CO if Co-ox is accurate this am.   Probably has some depression. On Lexapro.  LOS: 20 days    Gaye Pollack 06/05/2020

## 2020-06-06 ENCOUNTER — Inpatient Hospital Stay (HOSPITAL_COMMUNITY): Payer: Medicare HMO

## 2020-06-06 LAB — CBC
HCT: 30 % — ABNORMAL LOW (ref 39.0–52.0)
Hemoglobin: 9.2 g/dL — ABNORMAL LOW (ref 13.0–17.0)
MCH: 30.7 pg (ref 26.0–34.0)
MCHC: 30.7 g/dL (ref 30.0–36.0)
MCV: 100 fL (ref 80.0–100.0)
Platelets: 190 10*3/uL (ref 150–400)
RBC: 3 MIL/uL — ABNORMAL LOW (ref 4.22–5.81)
RDW: 15.9 % — ABNORMAL HIGH (ref 11.5–15.5)
WBC: 19.9 10*3/uL — ABNORMAL HIGH (ref 4.0–10.5)
nRBC: 0 % (ref 0.0–0.2)

## 2020-06-06 LAB — BASIC METABOLIC PANEL
Anion gap: 10 (ref 5–15)
BUN: 40 mg/dL — ABNORMAL HIGH (ref 8–23)
CO2: 28 mmol/L (ref 22–32)
Calcium: 8.5 mg/dL — ABNORMAL LOW (ref 8.9–10.3)
Chloride: 100 mmol/L (ref 98–111)
Creatinine, Ser: 1.29 mg/dL — ABNORMAL HIGH (ref 0.61–1.24)
GFR, Estimated: 53 mL/min — ABNORMAL LOW (ref >60)
Glucose, Bld: 140 mg/dL — ABNORMAL HIGH (ref 70–99)
Potassium: 4.1 mmol/L (ref 3.5–5.1)
Sodium: 138 mmol/L (ref 135–145)

## 2020-06-06 LAB — URINALYSIS, ROUTINE W REFLEX MICROSCOPIC
Bilirubin Urine: NEGATIVE
Glucose, UA: NEGATIVE mg/dL
Hgb urine dipstick: NEGATIVE
Ketones, ur: NEGATIVE mg/dL
Nitrite: NEGATIVE
Protein, ur: NEGATIVE mg/dL
Specific Gravity, Urine: 1.02 (ref 1.005–1.030)
pH: 6 (ref 5.0–8.0)

## 2020-06-06 LAB — COOXEMETRY PANEL
Carboxyhemoglobin: 1.1 % (ref 0.5–1.5)
Methemoglobin: 1.1 % (ref 0.0–1.5)
O2 Saturation: 55 %
Total hemoglobin: 9.4 g/dL — ABNORMAL LOW (ref 12.0–16.0)

## 2020-06-06 LAB — GLUCOSE, CAPILLARY
Glucose-Capillary: 116 mg/dL — ABNORMAL HIGH (ref 70–99)
Glucose-Capillary: 126 mg/dL — ABNORMAL HIGH (ref 70–99)
Glucose-Capillary: 129 mg/dL — ABNORMAL HIGH (ref 70–99)
Glucose-Capillary: 171 mg/dL — ABNORMAL HIGH (ref 70–99)
Glucose-Capillary: 174 mg/dL — ABNORMAL HIGH (ref 70–99)
Glucose-Capillary: 194 mg/dL — ABNORMAL HIGH (ref 70–99)

## 2020-06-06 LAB — PROCALCITONIN: Procalcitonin: 0.26 ng/mL

## 2020-06-06 LAB — PHOSPHORUS: Phosphorus: 3 mg/dL (ref 2.5–4.6)

## 2020-06-06 LAB — MAGNESIUM: Magnesium: 2.3 mg/dL (ref 1.7–2.4)

## 2020-06-06 MED ORDER — VANCOMYCIN HCL IN DEXTROSE 1-5 GM/200ML-% IV SOLN
1000.0000 mg | INTRAVENOUS | Status: AC
  Administered 2020-06-06 – 2020-06-12 (×7): 1000 mg via INTRAVENOUS
  Filled 2020-06-06 (×7): qty 200

## 2020-06-06 MED ORDER — SODIUM CHLORIDE 0.9 % IV SOLN
2.0000 g | Freq: Two times a day (BID) | INTRAVENOUS | Status: AC
  Administered 2020-06-06 – 2020-06-12 (×14): 2 g via INTRAVENOUS
  Filled 2020-06-06 (×14): qty 2

## 2020-06-06 MED ORDER — DOBUTAMINE IN D5W 4-5 MG/ML-% IV SOLN
1.5000 ug/kg/min | INTRAVENOUS | Status: DC
  Administered 2020-06-08: 1.5 ug/kg/min via INTRAVENOUS
  Filled 2020-06-06: qty 250

## 2020-06-06 NOTE — Progress Notes (Signed)
14 Days Post-Op Procedure(s) (LRB): AORTIC VALVE REPLACEMENT (AVR) USING EDWARDS RESILIA 29 MM AORTIC VALVE. (N/A) CORONARY ARTERY BYPASS GRAFTING (CABG) USING LIMA to LAD; ENDOSCOPIC HARVESTED RIGHT GREATER SAPHENOUS VEIN: SVG to PD; SVG to SEQUENCED INTERMEDIATE to OM (N/A) TRANSESOPHAGEAL ECHOCARDIOGRAM (TEE) (N/A) ENDOVEIN HARVEST OF GREATER SAPHENOUS VEIN (Right) Subjective: Says he is uncomfortable. Feels like he needs to have a BM this morning.   Objective: Vital signs in last 24 hours: Temp:  [97.4 F (36.3 C)-98.6 F (37 C)] 97.7 F (36.5 C) (12/10 0355) Pulse Rate:  [68-91] 85 (12/10 0645) Cardiac Rhythm: Normal sinus rhythm (12/10 0400) Resp:  [15-39] 22 (12/10 0645) BP: (90-123)/(63-81) 94/63 (12/10 0600) SpO2:  [85 %-99 %] 96 % (12/10 0645) Weight:  [78.1 kg] 78.1 kg (12/10 0500)  Hemodynamic parameters for last 24 hours: CVP:  [2 mmHg-13 mmHg] 4 mmHg  Intake/Output from previous day: 12/09 0701 - 12/10 0700 In: 1876.4 [P.O.:600; I.V.:202.6; NG/GT:1073.8] Out: 980 [Urine:980] Intake/Output this shift: No intake/output data recorded.  General appearance: alert, fatigued and color looks better to me today Neurologic: intact Heart: regular rate and rhythm, S1, S2 normal, no murmur Lungs: diminished breath sounds bilaterally Abdomen: soft, non-tender; bowel sounds normal Extremities: edema mild Wound: incision healing well  Lab Results: Recent Labs    06/06/20 0351  WBC 19.9*  HGB 9.2*  HCT 30.0*  PLT 190   BMET:  Recent Labs    06/05/20 0334 06/06/20 0351  NA 141 138  K 4.2 4.1  CL 102 100  CO2 29 28  GLUCOSE 113* 140*  BUN 41* 40*  CREATININE 1.58* 1.29*  CALCIUM 9.1 8.5*    PT/INR: No results for input(s): LABPROT, INR in the last 72 hours. ABG    Component Value Date/Time   PHART 7.381 05/26/2020 1750   HCO3 22.2 05/26/2020 1750   TCO2 23 05/26/2020 1750   ACIDBASEDEF 2.0 05/26/2020 1750   O2SAT 55.0 06/06/2020 0355   CBG (last 3)   Recent Labs    06/05/20 2338 06/06/20 0342 06/06/20 0740  GLUCAP 142* 116* 174*    Assessment/Plan: S/P Procedure(s) (LRB): AORTIC VALVE REPLACEMENT (AVR) USING EDWARDS RESILIA 29 MM AORTIC VALVE. (N/A) CORONARY ARTERY BYPASS GRAFTING (CABG) USING LIMA to LAD; ENDOSCOPIC HARVESTED RIGHT GREATER SAPHENOUS VEIN: SVG to PD; SVG to SEQUENCED INTERMEDIATE to OM (N/A) TRANSESOPHAGEAL ECHOCARDIOGRAM (TEE) (N/A) ENDOVEIN HARVEST OF GREATER SAPHENOUS VEIN (Right)  POD 14  Hemodynamically stable. Co-ox improved to 55% this am on dobut 2.5. CVP 9. Rhythm is sinus 80's.  Increased oxygen requirement, leukocytosis to 19.9, and increased RLL opacity on CXR two days ago suggests pneumonia. Repeat CXR ordered this am. Will start empiric Vanc and cefepime per pharmacy.  Renal function stable.  Wt is trending up again if accurate +7 lbs over the past two days. May need to resume some diuretic.   Continue tube feeds to maximize nutrition.   Encourage IS, Flutter valve.  Weakness and debilitation is a major problem at this time and has probably lead to pneumonia.  LOS: 21 days    [name] [name] 06/06/2020

## 2020-06-06 NOTE — Progress Notes (Signed)
Pharmacy Antibiotic Note  [name] [name] is a 86 y.o. male admitted on 05/16/2020 s/p bAVR and CABG, now with WBC 19 and opacity on cxr, remains afebrile today .  Pharmacy has been consulted for Vancomycin and cefepime dosing. Cr 1.3, CrCl 61ml/min  Plan: Cefepime 2GM q12h Vancomycin 1gm q24h est AUC 490  Height: 5\' 9"  (175.3 cm) Weight: 78.1 kg (172 lb 2.9 oz) IBW/kg (Calculated) : 70.7  Temp (24hrs), Avg:97.8 F (36.6 C), Min:97.4 F (36.3 C), Max:98.6 F (37 C)  Recent Labs  Lab 05/31/20 0344 06/01/20 0343 06/02/20 0331 06/03/20 0411 06/04/20 0345 06/05/20 0334 06/06/20 0351  WBC 9.9  --  10.2  --   --   --  19.9*  CREATININE 1.33*   < > 1.63* 1.56* 1.51* 1.58* 1.29*   < > = values in this interval not displayed.    Estimated Creatinine Clearance: 38.1 mL/min (A) (by C-G formula based on SCr of 1.29 mg/dL (H)).    Allergies  Allergen Reactions  . Simvastatin Other (See Comments)    "caused neuropathy pain"    Antimicrobials this admission:  Dose adjustments this admission:   Microbiology results:    Bonnita Nasuti Pharm.D. CPP, BCPS Clinical Pharmacist [phone] 06/06/2020 1:13 PM

## 2020-06-06 NOTE — Progress Notes (Addendum)
Nutrition Follow-up  DOCUMENTATION CODES:   Non-severe (moderate) malnutrition in context of chronic illness  INTERVENTION:   Continue TF via Cortrak:  Osmolite 1.5 at goal rate of 50 mL/hr x 24 hours.   PROSource TF BID 45 mL BID   This regimen provides 1880 kcals, 97 grams of protein, and 912 mL of free water. This regimen meets 100% of estimated needs.   Do not recommend switching pt to nocturnal feeds at this time because po intake continues to be poor.   MVI with minerals  Ensure Enlive po BID, each supplement provides 350 kcal and 20 grams of protein   NUTRITION DIAGNOSIS:   Moderate Malnutrition related to chronic illness (CHF, LLE edema) as evidenced by mild fat depletion,severe muscle depletion,moderate muscle depletion.  Ongoing  GOAL:   Patient will meet greater than or equal to 90% of their needs  Progressing   MONITOR:   PO intake,Supplement acceptance,Labs,Weight trends,Skin  REASON FOR ASSESSMENT:   Consult Enteral/tube feeding initiation and management  ASSESSMENT:   Pt with PMH of HTN, HLD, chronic LLE, peripheral neuropathy, essential tremor, and prostate cancer. Presented with CHF exacerbation and severe low flow AS. S/p right/left heart cath and coronary angiography on 11/22. Underwent CABG x4 and tissue AVR on 11/26.  Pt was sitting in chair during RD visit. Pt stated he is uncomfortable and that it is difficult to rest. Stated he feels full but is able to drink some Ensure throughout the day. Pt could not provide how many a day he consumes. He reports only eating "bites" off of his meal tray.  Per RN, pt is consuming mostly liquids po besides applesauce to take meds.   Pt is tolerating TF at goal rate of 50 mL/hr. RD does not recommend switching to nocturnal TF because po intake continues to be poor.   Per RN, pt had large BM this AM after 6 day constipation.      Noted WBC is elevated at 19K and O2 requirement increased to 10L HFNC. There is  concern for possible PNA.   Labs reviewed: CBG's x 24 hours: 116-232  Medications reviewed and include: Colace, Marinol, Trinsicon/foltrin, SSI, Protonix, MVI with minerals, NS at 10 mL/hr   Diet Order:   Diet Order            Diet regular Room service appropriate? Yes; Fluid consistency: Thin  Diet effective now                 EDUCATION NEEDS:   Education needs have been addressed  Skin:  Skin Assessment: Skin Integrity Issues: Skin Integrity Issues:: Stage II Stage II: sacrum  Last BM:  06/06/20  Height:   Ht Readings from Last 1 Encounters:  05/23/20 5\' 9"  (1.753 m)    Weight:   Wt Readings from Last 1 Encounters:  06/06/20 78.1 kg    Ideal Body Weight:  72.7 kg  BMI:  Body mass index is 25.43 kg/m.  Estimated Nutritional Needs:   Kcal:  1700-1900  Protein:  90-110  Fluid:  >/= 2 L/day   Ronnald Nian, Dietetic Intern Pager: [phone] If unavailable: [phone]

## 2020-06-06 NOTE — TOC Progression Note (Signed)
Transition of Care [facility]) - Progression Note    Patient Details  Name: [name] [name] MRN: [id number] Date of Birth: 25-Jan-1928  [facility] CM/SW [place], [place] Phone Number: 06/06/2020, 5:33 PM  Clinical Narrative:      CSW spoke with patients spouse to let her know that [facility] can no longer offer SNF bed. Patients spouse is agreeable to patient going to [place] for SNF placement.  Patient has SNF bed at [facility]. Facility gets [place].  CSW will continue to follow.   Expected Discharge Plan: [place] Barriers to Discharge: Continued Medical Work up  Expected Discharge Plan and Services Expected Discharge Plan: [place] arrangements for the past 2 months: Single Family Home                                       Social Determinants of Health (SDOH) Interventions    Readmission Risk Interventions No flowsheet data found.

## 2020-06-06 NOTE — Progress Notes (Addendum)
Advanced Heart Failure Rounding Note  PCP-Cardiologist: Peter Martinique, MD   Subjective:    Restarted on inotropes yesterday for low Co-ox, 43%. Started on DBA 2.5. Co-ox up to 55% today. Rhythm stable on tele. CVP 9  Scr improved, down from 1.58>>1.29 today.   WBC elevated, 10>>19K. AF. mTemp 98.6. W/ increased O2 requirements. Now on 10L HF [place]. No cough.   Marinol added to stimulate appetite. Still not eating much. Currently getting tube feeds.   Looks depressed.   Echo 12/1  LVEF 30-35% (unchanged). RV systolic function mildly reduced. No pericardial effusion. No aortic valve prosthesis dysfunction.    Objective:   Weight Range: 78.1 kg Body mass index is 25.43 kg/m.   Vital Signs:   Temp:  [97.4 F (36.3 C)-98.6 F (37 C)] 97.7 F (36.5 C) (12/10 0355) Pulse Rate:  [68-91] 85 (12/10 0645) Resp:  [15-39] 22 (12/10 0645) BP: (90-131)/(63-91) 94/63 (12/10 0600) SpO2:  [85 %-99 %] 96 % (12/10 0645) Weight:  [78.1 kg] 78.1 kg (12/10 0500) Last BM Date: 05/30/20  Weight change: Filed Weights   06/04/20 0500 06/05/20 0500 06/06/20 0500  Weight: 74.8 kg 78 kg 78.1 kg    Intake/Output:   Intake/Output Summary (Last 24 hours) at 06/06/2020 0723 Last data filed at 06/06/2020 0600 Gross per 24 hour  Intake 1876.43 ml  Output 980 ml  Net 896.43 ml      Physical Exam   CVP 9  General:  Fatigued appearing elderly WM sitting up in chair. On HF [place] HEENT: normal, + Cortrak  Neck: supple. JVD ~ 9 cm. Carotids 2+ bilat; no bruits. No lymphadenopathy or thyromegaly appreciated. Cor: PMI nondisplaced. Regular rate & rhythm. No rubs, gallops or murmurs. Lungs: decreased BS at the bases bilaterally  Abdomen: soft, nontender, nondistended. No hepatosplenomegaly. No bruits or masses. Good bowel sounds. Extremities: no cyanosis, clubbing, rash, trace bilateral LEE, + bilateral unna boots  Neuro: alert & oriented x 3, cranial nerves grossly intact. moves all 4 extremities  w/o difficulty. Appears depressed     Telemetry   NSR 80s  Labs    CBC Recent Labs    06/06/20 0351  WBC 19.9*  HGB 9.2*  HCT 30.0*  MCV 100.0  PLT 660   Basic Metabolic Panel Recent Labs    06/05/20 0334 06/05/20 1630 06/06/20 0351  NA 141  --  138  K 4.2  --  4.1  CL 102  --  100  CO2 29  --  28  GLUCOSE 113*  --  140*  BUN 41*  --  40*  CREATININE 1.58*  --  1.29*  CALCIUM 9.1  --  8.5*  MG 2.5* 2.4 2.3  PHOS 3.3 2.8 3.0   Liver Function Tests No results for input(s): AST, ALT, ALKPHOS, BILITOT, PROT, ALBUMIN in the last 72 hours. No results for input(s): LIPASE, AMYLASE in the last 72 hours. Cardiac Enzymes No results for input(s): CKTOTAL, CKMB, CKMBINDEX, TROPONINI in the last 72 hours.  BNP: BNP (last 3 results) Recent Labs    11/19/19 1550 05/16/20 1736  BNP 1,139.7* 2,005.2*    ProBNP (last 3 results) Recent Labs    01/02/20 1134  PROBNP 1,167.0*     D-Dimer No results for input(s): DDIMER in the last 72 hours. Hemoglobin A1C No results for input(s): HGBA1C in the last 72 hours. Fasting Lipid Panel No results for input(s): CHOL, HDL, LDLCALC, TRIG, CHOLHDL, LDLDIRECT in the last 72 hours. Thyroid Function Tests  No results for input(s): TSH, T4TOTAL, T3FREE, THYROIDAB in the last 72 hours.  Invalid input(s): FREET3  Other results:   Imaging    No results found.   Medications:     Scheduled Medications: . aspirin EC  81 mg Oral Daily  . Chlorhexidine Gluconate Cloth  6 each Topical Daily  . docusate sodium  200 mg Oral Daily  . dronabinol  5 mg Oral BID AC  . escitalopram  5 mg Oral Daily  . feeding supplement  237 mL Oral BID BM  . feeding supplement (PROSource TF)  45 mL Per Tube BID  . ferrous [id number] C-folic acid  1 capsule Oral BID PC  . insulin aspart  0-15 Units Subcutaneous Q4H  . mouth rinse  15 mL Mouth Rinse BID  . midodrine  10 mg Oral TID  . multivitamin with minerals  1 tablet Oral Daily   . pantoprazole  40 mg Oral Daily    Infusions: . sodium chloride 10 mL/hr at 06/06/20 0600  . DOBUTamine 2.5 mcg/kg/min (06/06/20 0600)  . feeding supplement (OSMOLITE 1.5 CAL) 50 mL/hr at 06/06/20 0600    PRN Medications: sodium chloride, bisacodyl, Gerhardt's butt cream, melatonin, ondansetron (ZOFRAN) IV, sodium chloride flush, traMADol   Assessment/Plan   1. Severe 3V CAD: - s/p CABGx 4 (LIMA-LAD, sequential SVG-OM1 and OM2, SVG- PDA) - No s/s ischemia  - continue ASA 81 - no ? blocker w/ post-cardiotomy shock and low BP - Atorva started 12/7 and stopped 12/8. Doubt this is contributing to his weakness but ok to hold   2. Aortic Stenosis: - severe low flow low gradient AS - s/p tissue AVR 11/26  - AV prosthesis ok on post op echo - stable  3. Acute on Chronic Biventricular Heart Failure:  - Echo in 2014 showed normal LVEF 55-60% w/ G1DD.  - Echo 10/21 and EF 30-35% w/ global HK. RV systolic function mildly reduced. - LHC this admit w/ severe 3V CAD>>ICM. Now s/p CABG. Also w/ severe AS, possibly contributing to LV dysfunction, now s/p SAVR - post op recovery c/b difficulties weaning pressors/inotropes.  - Post-op echo 12/1 EF 30-35%, RV mildly reduced. AV prothesis ok - off NE and dopamine - DBA 2.5 added 12/9 for low co-ox (43%), Improved to 55% today   - On midodrine for BP support - CVP 9 - Continue UNNA - hypotension limits use of ARNi/ARB and spiro   4. Post Operative Atrial Fibrillation:   - currently NSR. Monitor on tele while on DBA - defer a/c to CT surgery    5. AKI:   - Baseline <1.0.  - Creatinine trending down, 1.52>>1.29 today   6. Hyponatremia - resolved  7. Anemia - expected ABLA from surgery  -  on PO Fe  - Hgb stable at 9.2   8. Debility - Major issue currently. PT/OT following.  - Recommending SNF.   9. Urinary Retention - Prior to admit I&O cath at home.  - WBC elevated at 19K, check UA  10. Leukocytosis  - WBC elevated  at 19K, AF - Increased O2 requirement on 10L HFNC, concern for PNA - Check PCT and CXR - Encourage IS  - Check UA  - Consider empiric abx   [name] Jester, PA-C  7:23 AM   Length of Stay: 21   Patient seen and examined with the above-signed Advanced Practice Provider and/or Housestaff. I personally reviewed laboratory data, imaging studies and relevant notes. I independently examined the patient and formulated  the important aspects of the plan. I have edited the note to reflect any of my changes or salient points. I have personally discussed the plan with the patient and/or family.  Feels weaker today. Not eating. Dobutamine started yesterday. Co-ox 43%->55%. WBC climbing. Afebrile. Getting TFs via [employer]  General:  Elderly weak appearing. Sitting in chair. No resp difficulty HEENT: normal + cortrak Neck: supple. no JVD. Carotids 2+ bilat; no bruits. No lymphadenopathy or thryomegaly appreciated. Cor: PMI nondisplaced. Regular rate & rhythm. No rubs, gallops or murmurs. Lungs: crackles at bases Abdomen: soft, nontender, nondistended. No hepatosplenomegaly. No bruits or masses. Good bowel sounds. Extremities: no cyanosis, clubbing, rash, edema Neuro: alert & orientedx3, cranial nerves grossly intact. moves all 4 extremities w/o difficulty. Affect pleasant  Remains very tenuous. Co-ox improved on dobutamine but remains weak and debilitated. Will get Cx. Start empiric vanc/cefepime. I am worried that he may be unable to bounce back at this point given his age.   [name] Bickers, MD  1:18 PM

## 2020-06-06 NOTE — Progress Notes (Signed)
Occupational Therapy Treatment Patient Details Name: [name] [name] MRN: [id number] DOB: September 23, 1927 Today's Date: 06/06/2020    History of present illness 86 yo male presenting to PCP with worsening LE edema and dyspnea. S/p right/left heart cath and coronary angiography on 11/22. S/p CABG x4 on 11/26. PMH including venous insufficiency with chronic lower extremity edema (mostly on the left), hypertension, hyperlipidemia, peripheral neuropathy, Bil TKA,  essential tremor, and prostate cancer.   OT comments  Patient continues with deficits listed below.  Nice session this date.  Patient with 4 sit to stands for peri care, transfer to [facility] and eventual steps with eva walker.  Patient able to walk about 62' with eva walker and recliner following.  OT focus for mobility is increased independence with toileting and functional transfers.  Nice effort this date, and patient worked hard despite fatigue.  OT to continue in the acute setting, SNF has been recommended.      Follow Up Recommendations  SNF    Equipment Recommendations  3 in 1 bedside commode;Wheelchair (measurements OT);Wheelchair cushion (measurements OT)    Recommendations for Other Services      Precautions / Restrictions Precautions Precautions: Fall;Sternal       Mobility Bed Mobility                  Transfers     Transfers: Sit to/from [employer] Transfers Sit to Stand: Mod assist Stand pivot transfers: Min assist       General transfer comment: eva walker and balance support when taking steps backwards.    Balance   Sitting-balance support: No upper extremity supported;Feet supported Sitting balance-Leahy Scale: Fair   Postural control: Posterior lean Standing balance support: Bilateral upper extremity supported Standing balance-Leahy Scale: Poor                             ADL either performed or assessed with clinical judgement   ADL       Grooming: Supervision/safety;Set  up;Sitting               Lower Body Dressing: Sitting/lateral leans;Maximal assistance   Toilet Transfer: +2 for safety/equipment;Minimal assistance             General ADL Comments: eva walker     Vision                  Cognition Arousal/Alertness: Awake/alert Behavior During Therapy: WFL for tasks assessed/performed Overall Cognitive Status: Within Functional Limits for tasks assessed                                                      General Comments  HR to 85 and O2 sats stable 90% and above on 8L via [place]    Pertinent Vitals/ Pain       Faces Pain Scale: Hurts a little bit Pain Location: chest surgical site Pain Descriptors / Indicators: Tender Pain Intervention(s): Monitored during session                                                          Frequency  Min 2X/week  Progress Toward Goals  OT Goals(current goals can now be found in the care plan section)  Progress towards OT goals: Progressing toward goals  Acute Rehab OT Goals Patient Stated Goal: I know I need to get stronger OT Goal Formulation: With patient Time For Goal Achievement: 06/09/20 Potential to Achieve Goals: Good  Plan Discharge plan remains appropriate    Co-evaluation                 AM-PAC OT "6 Clicks" Daily Activity     Outcome Measure   Help from another person eating meals?: A Little Help from another person taking care of personal grooming?: A Little Help from another person toileting, which includes using toliet, bedpan, or urinal?: A Lot Help from another person bathing (including washing, rinsing, drying)?: A Lot Help from another person to put on and taking off regular upper body clothing?: A Lot Help from another person to put on and taking off regular lower body clothing?: A Lot 6 Click Score: 14    End of Session Equipment Utilized During Treatment: Gait belt;Rolling walker;Oxygen  OT  Visit Diagnosis: Unsteadiness on feet (R26.81);Other abnormalities of gait and mobility (R26.89);Muscle weakness (generalized) (M62.81);Pain   Activity Tolerance Patient limited by fatigue   Patient Left in chair;with call bell/phone within reach;with nursing/sitter in room   Nurse Communication          Time: [phone] OT Time Calculation (min): 31 min  Charges: OT General Charges $OT Visit: 1 Visit OT Treatments $Self Care/Home Management : 23-37 mins  06/06/2020  Rich, OTR/L  Acute Rehabilitation Services  Office:  [phone]    Metta Clines 06/06/2020, 9:32 AM

## 2020-06-07 ENCOUNTER — Inpatient Hospital Stay (HOSPITAL_COMMUNITY): Payer: Medicare HMO

## 2020-06-07 DIAGNOSIS — I2510 Atherosclerotic heart disease of native coronary artery without angina pectoris: Secondary | ICD-10-CM

## 2020-06-07 LAB — BASIC METABOLIC PANEL WITH GFR
Anion gap: 9 (ref 5–15)
BUN: 37 mg/dL — ABNORMAL HIGH (ref 8–23)
CO2: 28 mmol/L (ref 22–32)
Calcium: 8.6 mg/dL — ABNORMAL LOW (ref 8.9–10.3)
Chloride: 102 mmol/L (ref 98–111)
Creatinine, Ser: 1.07 mg/dL (ref 0.61–1.24)
GFR, Estimated: >60 mL/min
Glucose, Bld: 105 mg/dL — ABNORMAL HIGH (ref 70–99)
Potassium: 4.1 mmol/L (ref 3.5–5.1)
Sodium: 139 mmol/L (ref 135–145)

## 2020-06-07 LAB — GLUCOSE, CAPILLARY
Glucose-Capillary: 86 mg/dL (ref 70–99)
Glucose-Capillary: 144 mg/dL — ABNORMAL HIGH (ref 70–99)
Glucose-Capillary: 146 mg/dL — ABNORMAL HIGH (ref 70–99)
Glucose-Capillary: 134 mg/dL — ABNORMAL HIGH (ref 70–99)

## 2020-06-07 LAB — CBC
HCT: 30.1 % — ABNORMAL LOW (ref 39.0–52.0)
Hemoglobin: 9.7 g/dL — ABNORMAL LOW (ref 13.0–17.0)
MCH: 31.8 pg (ref 26.0–34.0)
MCHC: 32.2 g/dL (ref 30.0–36.0)
MCV: 98.7 fL (ref 80.0–100.0)
Platelets: 203 10*3/uL (ref 150–400)
RBC: 3.05 MIL/uL — ABNORMAL LOW (ref 4.22–5.81)
RDW: 16.1 % — ABNORMAL HIGH (ref 11.5–15.5)
WBC: 14 10*3/uL — ABNORMAL HIGH (ref 4.0–10.5)
nRBC: 0 % (ref 0.0–0.2)

## 2020-06-07 LAB — COOXEMETRY PANEL
Carboxyhemoglobin: 1 % (ref 0.5–1.5)
Methemoglobin: 0.9 % (ref 0.0–1.5)
O2 Saturation: 55 %
Total hemoglobin: 9.3 g/dL — ABNORMAL LOW (ref 12.0–16.0)

## 2020-06-07 MED ORDER — POTASSIUM CHLORIDE CRYS ER 20 MEQ PO TBCR
20.0000 meq | EXTENDED_RELEASE_TABLET | Freq: Once | ORAL | Status: AC
  Administered 2020-06-07: 20 meq via ORAL
  Filled 2020-06-07: qty 1

## 2020-06-07 MED ORDER — DIGOXIN 125 MCG PO TABS
0.0625 mg | ORAL_TABLET | Freq: Every day | ORAL | Status: DC
  Administered 2020-06-07 – 2020-06-08 (×2): 0.0625 mg via ORAL
  Filled 2020-06-07 (×2): qty 1

## 2020-06-07 MED ORDER — MELATONIN 5 MG PO TABS: 5.0000 mg | ORAL_TABLET | Freq: Once | ORAL | Status: AC

## 2020-06-07 MED ORDER — FUROSEMIDE 10 MG/ML IJ SOLN
40.0000 mg | Freq: Once | INTRAMUSCULAR | Status: AC
  Administered 2020-06-07: 40 mg via INTRAVENOUS
  Filled 2020-06-07: qty 4

## 2020-06-07 NOTE — Progress Notes (Signed)
Patient ID: [name] [name], male   DOB: 07-16-1927, 86 y.o.   MRN: [id number]     Advanced Heart Failure Rounding Note  PCP-Cardiologist: Peter Martinique, MD   Subjective:    Remains on dobutamine 2.5 with co-ox 55%.  CVP 10 today.  MAP stable on midodrine 10 mg tid.   Scr improved, down to 1.07.   WBC elevated, 10>>19K>>Pending today. afebrile. mTemp 98.6. On 4 L HFNC, treating for PNA with vancomycin/cefepime.   Marinol added to stimulate appetite. Still not eating much. Currently getting tube feeds.   Feels "miserable."  Was able to walk about 50 feet with PT and walker this morning.   Echo 12/1  LVEF 30-35% (unchanged). RV systolic function mildly reduced. No pericardial effusion. No aortic valve prosthesis dysfunction.    Objective:   Weight Range: 80.1 kg Body mass index is 26.08 kg/m.   Vital Signs:   Temp:  [96.7 F (35.9 C)-98.3 F (36.8 C)] 96.7 F (35.9 C) (12/11 0730) Pulse Rate:  [34-200] 75 (12/11 0700) Resp:  [15-44] 35 (12/11 0700) BP: (85-134)/(57-89) 126/80 (12/11 0700) SpO2:  [62 %-100 %] 100 % (12/11 0700) Weight:  [80.1 kg] 80.1 kg (12/11 0500) Last BM Date: 06/06/20  Weight change: Filed Weights   06/05/20 0500 06/06/20 0500 06/07/20 0500  Weight: 78 kg 78.1 kg 80.1 kg    Intake/Output:   Intake/Output Summary (Last 24 hours) at 06/07/2020 0930 Last data filed at 06/07/2020 0737 Gross per 24 hour  Intake 2229.4 ml  Output 1100 ml  Net 1129.4 ml      Physical Exam   CVP 10 General: NAD Neck: JVP 8-9 cm. no thyromegaly or thyroid nodule.  Lungs: Crackles at bases.  CV: Nondisplaced PMI.  Heart regular S1/S2, no S3/S4, 1/6 SEM RUSB.  No peripheral edema.   Abdomen: Soft, nontender, no hepatosplenomegaly, no distention.  Skin: Intact without lesions or rashes.  Neurologic: Alert and oriented x 3.  Psych: Depressed affect. Extremities: No clubbing or cyanosis.  HEENT: Normal.    Telemetry   NSR 80s  Labs    CBC Recent Labs     06/06/20 0351  WBC 19.9*  HGB 9.2*  HCT 30.0*  MCV 100.0  PLT 174   Basic Metabolic Panel Recent Labs    06/05/20 1630 06/06/20 0351 06/07/20 0247  NA  --  138 139  K  --  4.1 4.1  CL  --  100 102  CO2  --  28 28  GLUCOSE  --  140* 105*  BUN  --  40* 37*  CREATININE  --  1.29* 1.07  CALCIUM  --  8.5* 8.6*  MG 2.4 2.3  --   PHOS 2.8 3.0  --    Liver Function Tests No results for input(s): AST, ALT, ALKPHOS, BILITOT, PROT, ALBUMIN in the last 72 hours. No results for input(s): LIPASE, AMYLASE in the last 72 hours. Cardiac Enzymes No results for input(s): CKTOTAL, CKMB, CKMBINDEX, TROPONINI in the last 72 hours.  BNP: BNP (last 3 results) Recent Labs    11/19/19 1550 05/16/20 1736  BNP 1,139.7* 2,005.2*    ProBNP (last 3 results) Recent Labs    01/02/20 1134  PROBNP 1,167.0*     D-Dimer No results for input(s): DDIMER in the last 72 hours. Hemoglobin A1C No results for input(s): HGBA1C in the last 72 hours. Fasting Lipid Panel No results for input(s): CHOL, HDL, LDLCALC, TRIG, CHOLHDL, LDLDIRECT in the last 72 hours. Thyroid Function Tests No results  for input(s): TSH, T4TOTAL, T3FREE, THYROIDAB in the last 72 hours.  Invalid input(s): FREET3  Other results:   Imaging    No results found.   Medications:     Scheduled Medications: . aspirin EC  81 mg Oral Daily  . Chlorhexidine Gluconate Cloth  6 each Topical Daily  . digoxin  0.0625 mg Oral Daily  . docusate sodium  200 mg Oral Daily  . dronabinol  5 mg Oral BID AC  . escitalopram  5 mg Oral Daily  . feeding supplement  237 mL Oral BID BM  . feeding supplement (PROSource TF)  45 mL Per Tube BID  . ferrous [id number] C-folic acid  1 capsule Oral BID PC  . insulin aspart  0-15 Units Subcutaneous Q4H  . mouth rinse  15 mL Mouth Rinse BID  . midodrine  10 mg Oral TID  . multivitamin with minerals  1 tablet Oral Daily  . pantoprazole  40 mg Oral Daily    Infusions: . sodium  chloride 10 mL/hr at 06/07/20 0737  . ceFEPime (MAXIPIME) IV Stopped (06/06/20 2315)  . DOBUTamine 2.5 mcg/kg/min (06/07/20 0737)  . feeding supplement (OSMOLITE 1.5 CAL) 50 mL/hr at 06/07/20 0737  . vancomycin Stopped (06/06/20 1059)    PRN Medications: sodium chloride, bisacodyl, Gerhardt's butt cream, melatonin, ondansetron (ZOFRAN) IV, sodium chloride flush, traMADol   Assessment/Plan   1. Severe 3V CAD: - s/p CABGx 4 (LIMA-LAD, sequential SVG-OM1 and OM2, SVG- PDA) - No chest pain.  - continue ASA 81 - no ? blocker w/ post-cardiotomy shock and low BP - Atorva started 12/7 and stopped 12/8. Doubt this is contributing to his weakness but ok to hold   2. Aortic Stenosis: - severe low flow low gradient AS - s/p tissue AVR 11/26  - AV prosthesis ok on post op echo - stable  3. Acute on Chronic Biventricular Heart Failure:  - Echo in 2014 showed normal LVEF 55-60% w/ G1DD.  - Echo 10/21 and EF 30-35% w/ global HK. RV systolic function mildly reduced. - LHC this admit w/ severe 3V CAD>>ICM. Now s/p CABG. Also w/ severe AS, possibly contributing to LV dysfunction, now s/p SAVR.  - post op recovery c/b difficulties weaning pressors/inotropes.  - Post-op echo 12/1 EF 30-35%, RV mildly reduced. AV prothesis ok - off NE and dopamine - DBA 2.5 added 12/9 for low co-ox (43%), stable at 55% today. Will add low dose digoxin 0.0625 mg daily with improved creatinine.  - On midodrine for BP support - CVP 10, will give a dose of Lasix 40 mg IV x 1 today (creatinine improved).  - Continue UNNA - hypotension limits use of ARNi/ARB and spiro   4. Post Operative Atrial Fibrillation:   - currently NSR. Monitor on tele while on DBA - defer anticoagulation to CT surgery    5. AKI:   - Baseline <1.0.  - Creatinine trending down, 1.52>>1.29 today   6. Hyponatremia - resolved  7. Anemia - expected ABLA from surgery  -  on PO Fe  - Hgb stable at 9.2   8. Debility - Major issue  currently. PT/OT following.  - Recommending SNF.   9. Urinary Retention - Prior to admit I&O cath at home.  - WBC elevated at 19K, check UA  10. Leukocytosis  - WBC elevated at 19K, febrile.  PCT not markedly elevated at 0.26.  Need repeat CBC today.  - concern for PNA, getting empiric cefepime/vancomycin.  - Going for CXR today.  -  Encourage IS   CRITICAL CARE Performed by: Loralie Champagne  Total critical care time: 35 minutes  Critical care time was exclusive of separately billable procedures and treating other patients.  Critical care was necessary to treat or prevent imminent or life-threatening deterioration.  Critical care was time spent personally by me on the following activities: development of treatment plan with patient and/or surrogate as well as nursing, discussions with consultants, evaluation of patient's response to treatment, examination of patient, obtaining history from patient or surrogate, ordering and performing treatments and interventions, ordering and review of laboratory studies, ordering and review of radiographic studies, pulse oximetry and re-evaluation of patient's condition.  Loralie Champagne 06/07/2020 9:35 AM

## 2020-06-07 NOTE — Progress Notes (Signed)
15 Days Post-Op Procedure(s) (LRB): AORTIC VALVE REPLACEMENT (AVR) USING EDWARDS RESILIA 29 MM AORTIC VALVE. (N/A) CORONARY ARTERY BYPASS GRAFTING (CABG) USING LIMA to LAD; ENDOSCOPIC HARVESTED RIGHT GREATER SAPHENOUS VEIN: SVG to PD; SVG to SEQUENCED INTERMEDIATE to OM (N/A) TRANSESOPHAGEAL ECHOCARDIOGRAM (TEE) (N/A) ENDOVEIN HARVEST OF GREATER SAPHENOUS VEIN (Right) Subjective: Feeling miserable  Objective: Vital signs in last 24 hours: Temp:  [96.7 F (35.9 C)-98.3 F (36.8 C)] 96.7 F (35.9 C) (12/11 0730) Pulse Rate:  [34-200] 75 (12/11 0700) Cardiac Rhythm: Normal sinus rhythm (12/11 0000) Resp:  [15-44] 35 (12/11 0700) BP: (85-134)/(57-89) 126/80 (12/11 0700) SpO2:  [62 %-100 %] 100 % (12/11 0700) Weight:  [80.1 kg] 80.1 kg (12/11 0500)  Hemodynamic parameters for last 24 hours: CVP:  [0 mmHg-66 mmHg] 11 mmHg  Intake/Output from previous day: 12/10 0701 - 12/11 0700 In: 2062.4 [P.O.:480; I.V.:225.9; NG/GT:952.5; IV Piggyback:404] Out: 1100 [Urine:1100] Intake/Output this shift: Total I/O In: 412.9 [I.V.:84.5; NG/GT:328.3] Out: -   General appearance: fatigued Neurologic: intact Heart: regular rate and rhythm, S1, S2 normal, no murmur, click, rub or gallop Lungs: clear to auscultation bilaterally Abdomen: soft, non-tender; bowel sounds normal; no masses,  no organomegaly Extremities: Unna boots Wound: c/d/i  Lab Results: Recent Labs    06/06/20 0351  WBC 19.9*  HGB 9.2*  HCT 30.0*  PLT 190   BMET:  Recent Labs    06/06/20 0351 06/07/20 0247  NA 138 139  K 4.1 4.1  CL 100 102  CO2 28 28  GLUCOSE 140* 105*  BUN 40* 37*  CREATININE 1.29* 1.07  CALCIUM 8.5* 8.6*    PT/INR: No results for input(s): LABPROT, INR in the last 72 hours. ABG    Component Value Date/Time   PHART 7.381 05/26/2020 1750   HCO3 22.2 05/26/2020 1750   TCO2 23 05/26/2020 1750   ACIDBASEDEF 2.0 05/26/2020 1750   O2SAT 55.0 06/07/2020 0247   CBG (last 3)  Recent Labs     06/06/20 2356 06/07/20 0350 06/07/20 0651  GLUCAP 129* 53 144*    Assessment/Plan: S/P Procedure(s) (LRB): AORTIC VALVE REPLACEMENT (AVR) USING EDWARDS RESILIA 29 MM AORTIC VALVE. (N/A) CORONARY ARTERY BYPASS GRAFTING (CABG) USING LIMA to LAD; ENDOSCOPIC HARVESTED RIGHT GREATER SAPHENOUS VEIN: SVG to PD; SVG to SEQUENCED INTERMEDIATE to OM (N/A) TRANSESOPHAGEAL ECHOCARDIOGRAM (TEE) (N/A) ENDOVEIN HARVEST OF GREATER SAPHENOUS VEIN (Right) Mobilize Diuresis check CXR  F/u cultures for leukocytosis   LOS: 22 days    Wonda Olds 06/07/2020

## 2020-06-07 NOTE — Progress Notes (Signed)
TCTS Evening Rounds  Stable day Remains in NSR Excellent UOP BP 123/68   Pulse 85   Temp 98 F (36.7 C) (Oral)   Resp (!) 23   Ht 5\' 9"  (1.753 m)   Wt 80.1 kg   SpO2 92%   BMI 26.08 kg/m  Alert/oriented CTA RRR   Intake/Output Summary (Last 24 hours) at 06/07/2020 2059 Last data filed at 06/07/2020 2000 Gross per 24 hour  Intake 2412 ml  Output 2475 ml  Net -63 ml    A/p: continue present management. Fareeha Evon Z. Orvan Seen, [place]

## 2020-06-08 ENCOUNTER — Inpatient Hospital Stay (HOSPITAL_COMMUNITY): Payer: Medicare HMO

## 2020-06-08 LAB — CBC
HCT: 29.4 % — ABNORMAL LOW (ref 39.0–52.0)
Hemoglobin: 9 g/dL — ABNORMAL LOW (ref 13.0–17.0)
MCH: 30.7 pg (ref 26.0–34.0)
MCHC: 30.6 g/dL (ref 30.0–36.0)
MCV: 100.3 fL — ABNORMAL HIGH (ref 80.0–100.0)
Platelets: 180 10*3/uL (ref 150–400)
RBC: 2.93 MIL/uL — ABNORMAL LOW (ref 4.22–5.81)
RDW: 16 % — ABNORMAL HIGH (ref 11.5–15.5)
WBC: 10.8 10*3/uL — ABNORMAL HIGH (ref 4.0–10.5)
nRBC: 0 % (ref 0.0–0.2)

## 2020-06-08 LAB — COOXEMETRY PANEL
Carboxyhemoglobin: 1.3 % (ref 0.5–1.5)
Methemoglobin: 0.9 % (ref 0.0–1.5)
O2 Saturation: 71.1 %
Total hemoglobin: 9.4 g/dL — ABNORMAL LOW (ref 12.0–16.0)

## 2020-06-08 LAB — BASIC METABOLIC PANEL
Anion gap: 7 (ref 5–15)
BUN: 36 mg/dL — ABNORMAL HIGH (ref 8–23)
CO2: 31 mmol/L (ref 22–32)
Calcium: 8.5 mg/dL — ABNORMAL LOW (ref 8.9–10.3)
Chloride: 102 mmol/L (ref 98–111)
Creatinine, Ser: 1.13 mg/dL (ref 0.61–1.24)
GFR, Estimated: >60 mL/min (ref >60)
Glucose, Bld: 98 mg/dL (ref 70–99)
Potassium: 4.6 mmol/L (ref 3.5–5.1)
Sodium: 140 mmol/L (ref 135–145)

## 2020-06-08 LAB — GLUCOSE, CAPILLARY
Glucose-Capillary: 112 mg/dL — ABNORMAL HIGH (ref 70–99)
Glucose-Capillary: 118 mg/dL — ABNORMAL HIGH (ref 70–99)
Glucose-Capillary: 130 mg/dL — ABNORMAL HIGH (ref 70–99)
Glucose-Capillary: 138 mg/dL — ABNORMAL HIGH (ref 70–99)
Glucose-Capillary: 141 mg/dL — ABNORMAL HIGH (ref 70–99)
Glucose-Capillary: 155 mg/dL — ABNORMAL HIGH (ref 70–99)
Glucose-Capillary: 209 mg/dL — ABNORMAL HIGH (ref 70–99)
Glucose-Capillary: 86 mg/dL (ref 70–99)

## 2020-06-08 MED ORDER — FUROSEMIDE 10 MG/ML IJ SOLN
40.0000 mg | Freq: Two times a day (BID) | INTRAMUSCULAR | Status: DC
  Administered 2020-06-08 – 2020-06-10 (×5): 40 mg via INTRAVENOUS
  Filled 2020-06-08 (×5): qty 4

## 2020-06-08 MED ORDER — WHITE PETROLATUM EX OINT
TOPICAL_OINTMENT | CUTANEOUS | Status: AC
  Filled 2020-06-08: qty 28.35

## 2020-06-08 NOTE — Progress Notes (Addendum)
Patient ID: [name] [name], male   DOB: 1928/01/10, 86 y.o.   MRN: [id number]     Advanced Heart Failure Rounding Note  PCP-Cardiologist: Peter Martinique, MD   Subjective:    Remains on dobutamine 2.5 with co-ox 70%.  CVP 12 today.  MAP stable on midodrine 10 mg tid.   Creatinine stable 1.13.  I/Os even with 1 dose IV Lasix yesterday.   WBCs 10.8. afebrile. On 5L [place], treating for PNA with vancomycin/cefepime. CXR with bibasilar infiltrates.   HR in 50s but looks like possible high grade heart block (Ps not tracking with QRSs).   Marinol added to stimulate appetite. Still not eating much. Currently getting tube feeds.   Feels a little better, some dyspnea.   Echo 12/1  LVEF 30-35% (unchanged). RV systolic function mildly reduced. No pericardial effusion. No aortic valve prosthesis dysfunction.    Objective:   Weight Range: 79.5 kg Body mass index is 25.88 kg/m.   Vital Signs:   Temp:  [96.6 F (35.9 C)-98.4 F (36.9 C)] 97.8 F (36.6 C) (12/12 0737) Pulse Rate:  [65-88] 66 (12/12 0900) Resp:  [19-30] 25 (12/12 0900) BP: (94-139)/(58-83) 101/58 (12/12 0900) SpO2:  [89 %-97 %] 96 % (12/12 0900) Weight:  [79.5 kg] 79.5 kg (12/12 0500) Last BM Date: 06/06/20  Weight change: Filed Weights   06/06/20 0500 06/07/20 0500 06/08/20 0500  Weight: 78.1 kg 80.1 kg 79.5 kg    Intake/Output:   Intake/Output Summary (Last 24 hours) at 06/08/2020 1005 Last data filed at 06/08/2020 0400 Gross per 24 hour  Intake 1637.89 ml  Output 2395 ml  Net -757.11 ml      Physical Exam   CVP 12 General: NAD Neck: JVP 10 cm, no thyromegaly or thyroid nodule.  Lungs: Decreased BS at bases.  CV: Nondisplaced PMI.  Heart regular S1/S2, no S3/S4, 1/6 SEM RUSB.  1+ ankle edema.  Abdomen: Soft, nontender, no hepatosplenomegaly, no distention.  Skin: Intact without lesions or rashes.  Neurologic: Alert and oriented x 3.  Psych: Normal affect. Extremities: No clubbing or cyanosis.  HEENT:  Normal.    Telemetry   ?High grade heart block but HR in 50s (personally reviewed)  Labs    CBC Recent Labs    06/07/20 1015 06/08/20 0500  WBC 14.0* 10.8*  HGB 9.7* 9.0*  HCT 30.1* 29.4*  MCV 98.7 100.3*  PLT 203 518   Basic Metabolic Panel Recent Labs    06/05/20 1630 06/06/20 0351 06/06/20 0351 06/07/20 0247 06/08/20 0500  NA  --  138   < > 139 140  K  --  4.1   < > 4.1 4.6  CL  --  100   < > 102 102  CO2  --  28   < > 28 31  GLUCOSE  --  140*   < > 105* 98  BUN  --  40*   < > 37* 36*  CREATININE  --  1.29*   < > 1.07 1.13  CALCIUM  --  8.5*   < > 8.6* 8.5*  MG 2.4 2.3  --   --   --   PHOS 2.8 3.0  --   --   --    < > = values in this interval not displayed.   Liver Function Tests No results for input(s): AST, ALT, ALKPHOS, BILITOT, PROT, ALBUMIN in the last 72 hours. No results for input(s): LIPASE, AMYLASE in the last 72 hours. Cardiac Enzymes No results for  input(s): CKTOTAL, CKMB, CKMBINDEX, TROPONINI in the last 72 hours.  BNP: BNP (last 3 results) Recent Labs    11/19/19 1550 05/16/20 1736  BNP 1,139.7* 2,005.2*    ProBNP (last 3 results) Recent Labs    01/02/20 1134  PROBNP 1,167.0*     D-Dimer No results for input(s): DDIMER in the last 72 hours. Hemoglobin A1C No results for input(s): HGBA1C in the last 72 hours. Fasting Lipid Panel No results for input(s): CHOL, HDL, LDLCALC, TRIG, CHOLHDL, LDLDIRECT in the last 72 hours. Thyroid Function Tests No results for input(s): TSH, T4TOTAL, T3FREE, THYROIDAB in the last 72 hours.  Invalid input(s): FREET3  Other results:   Imaging    DG Chest Port 1 View  Result Date: 06/08/2020 CLINICAL DATA:  Status post cardiac surgery. EXAM: PORTABLE CHEST 1 VIEW COMPARISON:  June 07, 2020. FINDINGS: Stable cardiomegaly. Status post aortic valve repair. Feeding tube is seen entering stomach. Right-sided PICC line is unchanged. Status post right shoulder arthroplasty. Stable bibasilar  opacities are noted with stable left pleural effusion. IMPRESSION: Stable bibasilar opacities as described above. Stable left pleural effusion. Feeding tube seen entering stomach. Electronically Signed   By: Marijo Conception M.D.   On: 06/08/2020 08:24     Medications:     Scheduled Medications: . aspirin EC  81 mg Oral Daily  . Chlorhexidine Gluconate Cloth  6 each Topical Daily  . docusate sodium  200 mg Oral Daily  . dronabinol  5 mg Oral BID AC  . escitalopram  5 mg Oral Daily  . feeding supplement  237 mL Oral BID BM  . feeding supplement (PROSource TF)  45 mL Per Tube BID  . ferrous [id number] C-folic acid  1 capsule Oral BID PC  . furosemide  40 mg Intravenous BID  . insulin aspart  0-15 Units Subcutaneous Q4H  . mouth rinse  15 mL Mouth Rinse BID  . midodrine  10 mg Oral TID  . multivitamin with minerals  1 tablet Oral Daily  . pantoprazole  40 mg Oral Daily    Infusions: . sodium chloride 10 mL/hr at 06/08/20 0400  . ceFEPime (MAXIPIME) IV 2 g (06/08/20 0823)  . DOBUTamine 2.5 mcg/kg/min (06/08/20 0400)  . feeding supplement (OSMOLITE 1.5 CAL) 50 mL/hr at 06/07/20 1800  . vancomycin 1,000 mg (06/08/20 0941)    PRN Medications: sodium chloride, bisacodyl, Gerhardt's butt cream, melatonin, ondansetron (ZOFRAN) IV, sodium chloride flush, traMADol   Assessment/Plan   1. Severe 3V CAD: - s/p CABGx 4 (LIMA-LAD, sequential SVG-OM1 and OM2, SVG- PDA) - No chest pain.  - continue ASA 81 - no ? blocker w/ post-cardiotomy shock and low BP - Atorva started 12/7 and stopped 12/8. Doubt this is contributing to his weakness but ok to hold   2. Aortic Stenosis: - severe low flow low gradient AS - s/p tissue AVR 11/26  - AV prosthesis ok on post op echo - stable  3. Acute on Chronic Biventricular Heart Failure:  - Echo in 2014 showed normal LVEF 55-60% w/ G1DD.  - Echo 10/21 and EF 30-35% w/ global HK. RV systolic function mildly reduced. - LHC this admit w/  severe 3V CAD>>ICM. Now s/p CABG. Also w/ severe AS, possibly contributing to LV dysfunction, now s/p SAVR.  - post op recovery c/b difficulties weaning pressors/inotropes.  - Post-op echo 12/1 EF 30-35%, RV mildly reduced. AV prothesis ok - off NE and dopamine - DBA 2.5 added 12/9 for low co-ox (43%), stable  at 70% today. Will decrease to dobutamine 1.5 mcg/kg/min.  - On midodrine for BP support - Stop digoxin with heart block.  - CVP 12, will give Lasix 40 mg IV bid today (creatinine improved).  - Continue UNNA boots.  - hypotension limits use of ARNi/ARB and spiro   4. Post Operative Atrial Fibrillation:   - Monitor on tele while on DBA - defer anticoagulation to CT surgery    5. AKI:   - Baseline <1.0.  - Resolved.   6. Hyponatremia - resolved  7. Anemia - expected ABLA from surgery  -  on PO Fe  - Hgb stable   8. Debility - Major issue currently. PT/OT following.  - Recommending SNF.   9. Urinary Retention - Prior to admit I&O cath at home.   10. Leukocytosis  - WBCs lower today at 10, afebrile.  - concern for PNA, getting empiric cefepime/vancomycin.  - CXR with bibasilar infiltrates.  - Encourage IS   11. Rhythm - HR stable in 50s but possible high grade heart block.  Will get ECG => looks like actually atrial fibrillation. Will need to start anticoagulation when surgery ok with this.  - Do not need to pace right now, stable BP.  - Stop digoxin.   CRITICAL CARE Performed by: Loralie Champagne  Total critical care time: 35 minutes  Critical care time was exclusive of separately billable procedures and treating other patients.  Critical care was necessary to treat or prevent imminent or life-threatening deterioration.  Critical care was time spent personally by me on the following activities: development of treatment plan with patient and/or surrogate as well as nursing, discussions with consultants, evaluation of patient's response to treatment, examination  of patient, obtaining history from patient or surrogate, ordering and performing treatments and interventions, ordering and review of laboratory studies, ordering and review of radiographic studies, pulse oximetry and re-evaluation of patient's condition.  Loralie Champagne 06/08/2020 10:05 AM

## 2020-06-08 NOTE — Progress Notes (Signed)
TCTS Evening Rounds  Stable day Sat in chair several hours  Excellent UOP  BP 107/66   Pulse 74   Temp 98.2 F (36.8 C) (Oral)   Resp (!) 24   Ht 5\' 9"  (1.753 m)   Wt 79.5 kg   SpO2 92%   BMI 25.88 kg/m  Alert/oriented Irreg/irreg Mild edema  Labs stable  A/p: consider left thoracentesis tomorrow Consider anticoagulation for in/out Afib [name] [name], [name] [name]

## 2020-06-08 NOTE — Progress Notes (Signed)
16 Days Post-Op Procedure(s) (LRB): AORTIC VALVE REPLACEMENT (AVR) USING EDWARDS RESILIA 29 MM AORTIC VALVE. (N/A) CORONARY ARTERY BYPASS GRAFTING (CABG) USING LIMA to LAD; ENDOSCOPIC HARVESTED RIGHT GREATER SAPHENOUS VEIN: SVG to PD; SVG to SEQUENCED INTERMEDIATE to OM (N/A) TRANSESOPHAGEAL ECHOCARDIOGRAM (TEE) (N/A) ENDOVEIN HARVEST OF GREATER SAPHENOUS VEIN (Right) Subjective: Feeling tired  Objective: Vital signs in last 24 hours: Temp:  [97.7 F (36.5 C)-98.4 F (36.9 C)] 98 F (36.7 C) (12/12 1210) Pulse Rate:  [52-88] 52 (12/12 1200) Cardiac Rhythm: Atrial fibrillation (12/12 1200) Resp:  [19-36] 31 (12/12 1200) BP: (94-139)/(58-83) 97/61 (12/12 1200) SpO2:  [89 %-97 %] 95 % (12/12 1200) Weight:  [79.5 kg] 79.5 kg (12/12 0500)  Hemodynamic parameters for last 24 hours: CVP:  [9 mmHg-14 mmHg] 13 mmHg  Intake/Output from previous day: 12/11 0701 - 12/12 0700 In: 2918.2 [P.O.:660; I.V.:360.7; BP/PH:4327.6; IV Piggyback:400] Out: 2395 [Urine:2395] Intake/Output this shift: Total I/O In: 583.9 [I.V.:33.9; NG/GT:250; IV Piggyback:300] Out: 300 [Urine:300]  General appearance: alert and cooperative Neurologic: intact Heart: regular rate and rhythm, S1, S2 normal, no murmur, click, rub or gallop Lungs: diminished breath sounds bibasilar Abdomen: soft, non-tender; bowel sounds normal; no masses,  no organomegaly Extremities: edema mild Wound: c/d/i  Lab Results: Recent Labs    06/07/20 1015 06/08/20 0500  WBC 14.0* 10.8*  HGB 9.7* 9.0*  HCT 30.1* 29.4*  PLT 203 180   BMET:  Recent Labs    06/07/20 0247 06/08/20 0500  NA 139 140  K 4.1 4.6  CL 102 102  CO2 28 31  GLUCOSE 105* 98  BUN 37* 36*  CREATININE 1.07 1.13  CALCIUM 8.6* 8.5*    PT/INR: No results for input(s): LABPROT, INR in the last 72 hours. ABG    Component Value Date/Time   PHART 7.381 05/26/2020 1750   HCO3 22.2 05/26/2020 1750   TCO2 23 05/26/2020 1750   ACIDBASEDEF 2.0 05/26/2020  1750   O2SAT 71.1 06/08/2020 0534   CBG (last 3)  Recent Labs    06/07/20 2348 06/08/20 0343 06/08/20 0742  GLUCAP 138* 86 130*    Assessment/Plan: S/P Procedure(s) (LRB): AORTIC VALVE REPLACEMENT (AVR) USING EDWARDS RESILIA 29 MM AORTIC VALVE. (N/A) CORONARY ARTERY BYPASS GRAFTING (CABG) USING LIMA to LAD; ENDOSCOPIC HARVESTED RIGHT GREATER SAPHENOUS VEIN: SVG to PD; SVG to SEQUENCED INTERMEDIATE to OM (N/A) TRANSESOPHAGEAL ECHOCARDIOGRAM (TEE) (N/A) ENDOVEIN HARVEST OF GREATER SAPHENOUS VEIN (Right) Mobilize Diuresis nutritional support   LOS: 23 days    Wonda Olds 06/08/2020

## 2020-06-09 LAB — GLUCOSE, CAPILLARY
Glucose-Capillary: 115 mg/dL — ABNORMAL HIGH (ref 70–99)
Glucose-Capillary: 123 mg/dL — ABNORMAL HIGH (ref 70–99)
Glucose-Capillary: 147 mg/dL — ABNORMAL HIGH (ref 70–99)
Glucose-Capillary: 149 mg/dL — ABNORMAL HIGH (ref 70–99)
Glucose-Capillary: 149 mg/dL — ABNORMAL HIGH (ref 70–99)
Glucose-Capillary: 160 mg/dL — ABNORMAL HIGH (ref 70–99)

## 2020-06-09 LAB — CBC
HCT: 28.4 % — ABNORMAL LOW (ref 39.0–52.0)
Hemoglobin: 9.4 g/dL — ABNORMAL LOW (ref 13.0–17.0)
MCH: 32.2 pg (ref 26.0–34.0)
MCHC: 33.1 g/dL (ref 30.0–36.0)
MCV: 97.3 fL (ref 80.0–100.0)
Platelets: 177 10*3/uL (ref 150–400)
RBC: 2.92 MIL/uL — ABNORMAL LOW (ref 4.22–5.81)
RDW: 16 % — ABNORMAL HIGH (ref 11.5–15.5)
WBC: 10.4 10*3/uL (ref 4.0–10.5)
nRBC: 0 % (ref 0.0–0.2)

## 2020-06-09 LAB — BASIC METABOLIC PANEL
Anion gap: 9 (ref 5–15)
BUN: 38 mg/dL — ABNORMAL HIGH (ref 8–23)
CO2: 31 mmol/L (ref 22–32)
Calcium: 8.2 mg/dL — ABNORMAL LOW (ref 8.9–10.3)
Chloride: 98 mmol/L (ref 98–111)
Creatinine, Ser: 1.25 mg/dL — ABNORMAL HIGH (ref 0.61–1.24)
GFR, Estimated: 55 mL/min — ABNORMAL LOW (ref >60)
Glucose, Bld: 165 mg/dL — ABNORMAL HIGH (ref 70–99)
Potassium: 4.4 mmol/L (ref 3.5–5.1)
Sodium: 138 mmol/L (ref 135–145)

## 2020-06-09 LAB — COOXEMETRY PANEL
Carboxyhemoglobin: 1.4 % (ref 0.5–1.5)
Methemoglobin: 1 % (ref 0.0–1.5)
O2 Saturation: 67.6 %
Total hemoglobin: 9.4 g/dL — ABNORMAL LOW (ref 12.0–16.0)

## 2020-06-09 MED ORDER — TAMSULOSIN HCL 0.4 MG PO CAPS
0.4000 mg | ORAL_CAPSULE | Freq: Every day | ORAL | Status: DC
  Administered 2020-06-09 – 2020-06-26 (×18): 0.4 mg via ORAL
  Filled 2020-06-09 (×18): qty 1

## 2020-06-09 MED ORDER — SORBITOL 70 % SOLN
30.0000 mL | Freq: Once | Status: AC
  Administered 2020-06-09: 30 mL
  Filled 2020-06-09: qty 30

## 2020-06-09 NOTE — Progress Notes (Signed)
17 Days Post-Op Procedure(s) (LRB): AORTIC VALVE REPLACEMENT (AVR) USING EDWARDS RESILIA 29 MM AORTIC VALVE. (N/A) CORONARY ARTERY BYPASS GRAFTING (CABG) USING LIMA to LAD; ENDOSCOPIC HARVESTED RIGHT GREATER SAPHENOUS VEIN: SVG to PD; SVG to SEQUENCED INTERMEDIATE to OM (N/A) TRANSESOPHAGEAL ECHOCARDIOGRAM (TEE) (N/A) ENDOVEIN HARVEST OF GREATER SAPHENOUS VEIN (Right) Subjective:  His only complaint is of abdomen feeling bloated and needing have BM. Wants an enema.  Objective: Vital signs in last 24 hours: Temp:  [97.4 F (36.3 C)-98.3 F (36.8 C)] 97.5 F (36.4 C) (12/13 0356) Pulse Rate:  [51-82] 73 (12/13 0700) Cardiac Rhythm: Normal sinus rhythm (12/12 2000) Resp:  [23-45] 40 (12/13 0700) BP: (91-124)/(58-79) 124/67 (12/13 0700) SpO2:  [90 %-100 %] 100 % (12/13 0700) Weight:  [79.2 kg] 79.2 kg (12/13 0500)  Hemodynamic parameters for last 24 hours: CVP:  [1 mmHg-13 mmHg] 1 mmHg  Intake/Output from previous day: 12/12 0701 - 12/13 0700 In: 1513.5 [I.V.:63.5; NG/GT:1050; IV Piggyback:400.1] Out: 3300 [Urine:3300] Intake/Output this shift: No intake/output data recorded.  General appearance: alert, fatigued but more engaging this am. Neurologic: intact Heart: regular rate and rhythm, S1, S2 normal, no murmur Lungs: diminished breath sounds bibasilar Abdomen: soft, non-tender; bowel sounds normal Extremities: mild edema Wound: incision healing well.  Lab Results: Recent Labs    06/08/20 0500 06/09/20 0406  WBC 10.8* 10.4  HGB 9.0* 9.4*  HCT 29.4* 28.4*  PLT 180 177   BMET:  Recent Labs    06/08/20 0500 06/09/20 0406  NA 140 138  K 4.6 4.4  CL 102 98  CO2 31 31  GLUCOSE 98 165*  BUN 36* 38*  CREATININE 1.13 1.25*  CALCIUM 8.5* 8.2*    PT/INR: No results for input(s): LABPROT, INR in the last 72 hours. ABG    Component Value Date/Time   PHART 7.381 05/26/2020 1750   HCO3 22.2 05/26/2020 1750   TCO2 23 05/26/2020 1750   ACIDBASEDEF 2.0 05/26/2020  1750   O2SAT 67.6 06/09/2020 0405   CBG (last 3)  Recent Labs    06/08/20 1921 06/08/20 2351 06/09/20 0353  GLUCAP 155* 118* 147*    Assessment/Plan: S/P Procedure(s) (LRB): AORTIC VALVE REPLACEMENT (AVR) USING EDWARDS RESILIA 29 MM AORTIC VALVE. (N/A) CORONARY ARTERY BYPASS GRAFTING (CABG) USING LIMA to LAD; ENDOSCOPIC HARVESTED RIGHT GREATER SAPHENOUS VEIN: SVG to PD; SVG to SEQUENCED INTERMEDIATE to OM (N/A) TRANSESOPHAGEAL ECHOCARDIOGRAM (TEE) (N/A) ENDOVEIN HARVEST OF GREATER SAPHENOUS VEIN (Right)  POD 17 Hemodynamics stable on dobut 1.5 with Co-ox 68%. CVP 5.   Volume excess: Wt is stable at 175 lbs despite being -1786 cc yesterday on Lasix 40 bid IV.  Still straight cathing due to urinary retention with large amounts on bladder scan.   Tube feeds at goal for nutrition. He complains about not being able to eat due to no appetite and abdominal bloating. Had a small smear this am. Will give him some sorbitol down tube.  On vanc and Maxepime for suspected pneumonia. I think CXR is looking better. WBC back to normal.  Continue IS, PT   LOS: 24 days    Gaye Pollack 06/09/2020

## 2020-06-09 NOTE — Progress Notes (Signed)
Physical Therapy Treatment Patient Details Name: [name] [name] MRN: [id number] DOB: [birth year] Today's Date: 06/09/2020    History of Present Illness 86 yo male presenting to PCP with worsening LE edema and dyspnea. S/p right/left heart cath and coronary angiography on 11/22. S/p CABG x4 on 11/26. PMH including venous insufficiency with chronic lower extremity edema (mostly on the left), hypertension, hyperlipidemia, peripheral neuropathy, Bil TKA,  essential tremor, and prostate cancer.    PT Comments    Pt admitted with above diagnosis. Pt was able to ambulate with eva walker with incr distance today. Still needs min to mod assist but tolerated being up on feet better today.  O2 6L at rest and 8L with activity.  PRogressing slowly.  Pt currently with functional limitations due to the deficits listed below (see PT Problem List). Pt will benefit from skilled PT to increase their independence and safety with mobility to allow discharge to the venue listed below.     Follow Up Recommendations  SNF     Equipment Recommendations  None recommended by PT (has RW at home)    Recommendations for Other Services       Precautions / Restrictions Precautions Precautions: Fall;Sternal Precaution Comments: Reviewed sternal precautions Restrictions Other Position/Activity Restrictions: sternal    Mobility  Bed Mobility Overal bed mobility: Needs Assistance             General bed mobility comments: in chair  Transfers Overall transfer level: Needs assistance Equipment used: Ambulation equipment used (EVA walker) Transfers: Sit to/from [facility] Sit to Stand: Mod assist         General transfer comment: power up assist but steadier once up.  Ambulation/Gait Ambulation/Gait assistance: +2 safety/equipment;Mod assist;Min assist Gait Distance (Feet): 170 Feet Assistive device:  (EVA walker) Gait Pattern/deviations: Step-through pattern;Trunk flexed;Decreased  stride length;Leaning posteriorly;Wide base of support;Drifts right/left Gait velocity: slowed Gait velocity interpretation: <1.31 ft/sec, indicative of household ambulator General Gait Details: Pt continues to require min to mod assistance for balance.  Pt with incr distance today  and more steady as he progressed with his walking.  Educated on posture and  hip extension as pt flexes at trunk. Fatigues quickly needing close chair follow. Pt  needing 6LO2 at rest.  Sats registering at >90% with activity on 8LO2 with DOE 3/4.   Stairs             Wheelchair Mobility    Modified Rankin (Stroke Patients Only)       Balance Overall balance assessment: Needs assistance Sitting-balance support: No upper extremity supported;Feet supported Sitting balance-Leahy Scale: Fair     Standing balance support: Bilateral upper extremity supported Standing balance-Leahy Scale: Poor Standing balance comment: requires external assist and B UE to maintain standing balance                            Cognition Arousal/Alertness: Awake/alert Behavior During Therapy: WFL for tasks assessed/performed Overall Cognitive Status: Within Functional Limits for tasks assessed                                        Exercises General Exercises - Upper Extremity Shoulder Flexion: AAROM;Both;Seated;10 reps General Exercises - Lower Extremity Ankle Circles/Pumps: AROM;Both;10 reps;Supine Long Arc Quad: AAROM;Both;10 reps;Seated Hip Flexion/Marching: 10 reps;AAROM;Seated;Both    General Comments        Pertinent Vitals/Pain  Pain Assessment: Faces Faces Pain Scale: Hurts a little bit Pain Location: chest surgical site Pain Descriptors / Indicators: Tender Pain Intervention(s): Limited activity within patient's tolerance;Monitored during session;Repositioned    Home Living                      Prior Function            PT Goals (current goals can now be  found in the care plan section) Acute Rehab PT Goals Patient Stated Goal: I know I need to get stronger Progress towards PT goals: Progressing toward goals    Frequency    Min 2X/week      PT Plan Current plan remains appropriate    Co-evaluation              AM-PAC PT "6 Clicks" Mobility   Outcome Measure  Help needed turning from your back to your side while in a flat bed without using bedrails?: A Little Help needed moving from lying on your back to sitting on the side of a flat bed without using bedrails?: A Lot Help needed moving to and from a bed to a chair (including a wheelchair)?: A Lot Help needed standing up from a chair using your arms (e.g., wheelchair or bedside chair)?: A Lot Help needed to walk in hospital room?: A Lot Help needed climbing 3-5 steps with a railing? : A Lot 6 Click Score: 13    End of Session Equipment Utilized During Treatment: Oxygen;Gait belt Activity Tolerance: Patient limited by fatigue Patient left: in chair;with call bell/phone within reach Nurse Communication: Mobility status PT Visit Diagnosis: Muscle weakness (generalized) (M62.81);Difficulty in walking, not elsewhere classified (R26.2);Other abnormalities of gait and mobility (R26.89);Pain Pain - part of body:  (sternum)     Time: 5361-4431 PT Time Calculation (min) (ACUTE ONLY): 28 min  Charges:  $Gait Training: 8-22 mins $Therapeutic Exercise: 8-22 mins                     Ewelina Naves W,PT Acute Rehabilitation Services Pager:  [phone]  Office:  [place] 06/09/2020, 1:52 PM

## 2020-06-09 NOTE — Progress Notes (Addendum)
Patient ID: [name] [name], male   DOB: Jul 02, 1927, 86 y.o.   MRN: [id number]     Advanced Heart Failure Rounding Note  PCP-Cardiologist: Peter Martinique, MD   Subjective:    Dobutamine decreased yesterday to 1.5. Co-ox 68% today.  Good UOP w/ IV Lasix, -3.3L out yesterday. Wt only down 1 lb. CVP 12-13.   Digoxin discontinued 12/12 for ? Transient Heart block.   Treating for PNA with vancomycin/cefepime. CXR with bibasilar infiltrates. WBC down, 19>>14>>10K. AF. Breathing ok.   Marinol added to stimulate appetite. Still not eating much. Currently getting tube feeds.   Complaining of diffuse abdominal pain and constipation. Last BM was 2 days ago.     Objective:   Weight Range: 79.2 kg Body mass index is 25.78 kg/m.   Vital Signs:   Temp:  [97.4 F (36.3 C)-98.3 F (36.8 C)] 97.5 F (36.4 C) (12/13 0356) Pulse Rate:  [51-82] 73 (12/13 0700) Resp:  [23-45] 40 (12/13 0700) BP: (91-124)/(58-79) 124/67 (12/13 0700) SpO2:  [90 %-100 %] 100 % (12/13 0700) Weight:  [79.2 kg] 79.2 kg (12/13 0500) Last BM Date: 06/06/20  Weight change: Filed Weights   06/07/20 0500 06/08/20 0500 06/09/20 0500  Weight: 80.1 kg 79.5 kg 79.2 kg    Intake/Output:   Intake/Output Summary (Last 24 hours) at 06/09/2020 0738 Last data filed at 06/09/2020 0500 Gross per 24 hour  Intake 1513.53 ml  Output 3300 ml  Net -1786.47 ml      Physical Exam   PHYSICAL EXAM: CVP 13  General:  fatigue appearing elderly WM sitting up in chair. No respiratory difficulty HEENT: normal + cor trak  Neck: supple. JVD elevated to jaw. Carotids 2+ bilat; no bruits. No lymphadenopathy or thyromegaly appreciated. Cor: PMI nondisplaced. Regular rate & rhythm. No rubs, gallops or murmurs. Lungs: decreased BS at the bases  Abdomen: soft, nontender, nondistended. No hepatosplenomegaly. No bruits or masses. Hypoactive bowel sounds. Extremities: no cyanosis, clubbing, rash, 1+ bilateral LE + bilateral unna boots  Neuro:  alert & oriented x 3, cranial nerves grossly intact. moves all 4 extremities w/o difficulty. Affect pleasant.    Telemetry   Rhythm undetermined on tele rates in 70s, ? Transient HB yesterday vs slow Afib. 12 lead EKG pending. (personally reviewed)  Labs    CBC Recent Labs    06/08/20 0500 06/09/20 0406  WBC 10.8* 10.4  HGB 9.0* 9.4*  HCT 29.4* 28.4*  MCV 100.3* 97.3  PLT 180 654   Basic Metabolic Panel Recent Labs    06/08/20 0500 06/09/20 0406  NA 140 138  K 4.6 4.4  CL 102 98  CO2 31 31  GLUCOSE 98 165*  BUN 36* 38*  CREATININE 1.13 1.25*  CALCIUM 8.5* 8.2*   Liver Function Tests No results for input(s): AST, ALT, ALKPHOS, BILITOT, PROT, ALBUMIN in the last 72 hours. No results for input(s): LIPASE, AMYLASE in the last 72 hours. Cardiac Enzymes No results for input(s): CKTOTAL, CKMB, CKMBINDEX, TROPONINI in the last 72 hours.  BNP: BNP (last 3 results) Recent Labs    11/19/19 1550 05/16/20 1736  BNP 1,139.7* 2,005.2*    ProBNP (last 3 results) Recent Labs    01/02/20 1134  PROBNP 1,167.0*     D-Dimer No results for input(s): DDIMER in the last 72 hours. Hemoglobin A1C No results for input(s): HGBA1C in the last 72 hours. Fasting Lipid Panel No results for input(s): CHOL, HDL, LDLCALC, TRIG, CHOLHDL, LDLDIRECT in the last 72 hours. Thyroid Function Tests  No results for input(s): TSH, T4TOTAL, T3FREE, THYROIDAB in the last 72 hours.  Invalid input(s): FREET3  Other results:   Imaging    No results found.   Medications:     Scheduled Medications: . aspirin EC  81 mg Oral Daily  . Chlorhexidine Gluconate Cloth  6 each Topical Daily  . docusate sodium  200 mg Oral Daily  . dronabinol  5 mg Oral BID AC  . escitalopram  5 mg Oral Daily  . feeding supplement  237 mL Oral BID BM  . feeding supplement (PROSource TF)  45 mL Per Tube BID  . ferrous [id number] C-folic acid  1 capsule Oral BID PC  . furosemide  40 mg Intravenous  BID  . insulin aspart  0-15 Units Subcutaneous Q4H  . mouth rinse  15 mL Mouth Rinse BID  . midodrine  10 mg Oral TID  . multivitamin with minerals  1 tablet Oral Daily  . pantoprazole  40 mg Oral Daily    Infusions: . sodium chloride Stopped (06/08/20 0941)  . ceFEPime (MAXIPIME) IV Stopped (06/08/20 2152)  . DOBUTamine 1.5 mcg/kg/min (06/09/20 0500)  . feeding supplement (OSMOLITE 1.5 CAL) 50 mL/hr at 06/09/20 0500  . vancomycin Stopped (06/08/20 1042)    PRN Medications: sodium chloride, bisacodyl, Gerhardt's butt cream, melatonin, ondansetron (ZOFRAN) IV, sodium chloride flush, traMADol   Assessment/Plan   1. Severe 3V CAD: - s/p CABGx 4 (LIMA-LAD, sequential SVG-OM1 and OM2, SVG- PDA) - No chest pain.  - continue ASA 81 - no ? blocker w/ post-cardiotomy shock and low BP - Atorva started 12/7 and stopped 12/8. Doubt this is contributing to his weakness but ok to hold   2. Aortic Stenosis: - severe low flow low gradient AS - s/p tissue AVR 11/26  - AV prosthesis ok on post op echo - stable  3. Acute on Chronic Biventricular Heart Failure:  - Echo in 2014 showed normal LVEF 55-60% w/ G1DD.  - Echo 10/21 and EF 30-35% w/ global HK. RV systolic function mildly reduced. - LHC this admit w/ severe 3V CAD>>ICM. Now s/p CABG. Also w/ severe AS, possibly contributing to LV dysfunction, now s/p SAVR.  - post op recovery c/b difficulties weaning pressors/inotropes.  - Post-op echo 12/1 EF 30-35%, RV mildly reduced. AV prothesis ok - off NE and dopamine - DBA 2.5 added 12/9 for low co-ox (43%). Tolerating dobutamine wean, now on 1.5 mcg/kg/min. Co-ox 68% today  - On midodrine for BP support - Off digoxin with ? heart block.  - CVP 12-13, continue IV Lasix 40 mg IV bid today  - Continue UNNA boots.  - hypotension limits use of ARNi/ARB and spiro   4. Post Operative Atrial Fibrillation:   - Monitor on tele while on DBA - defer anticoagulation to CT surgery    5. AKI:    - Baseline <1.0.  - up slightly today at 1.25 - monitor   6. Hyponatremia - resolved  7. Anemia - expected ABLA from surgery  -  on PO Fe  - Hgb stable   8. Debility - Major issue currently. PT/OT following.  - Recommending SNF.   9. Urinary Retention - Prior to admit I&O cath at home.   10. PNA  - CXR w/ bilateral infiltrates, PCT 0.26  - on Vanc + cefepime - AF, WBC trending down   11. Rhythm - Bradycardic yesterday w/ HR in the 50s ? possible high grade heart block.  - Digoxin discontinued. HR in  70s currently - 12 lead EKG pending   12. Constipation  - Sorbtibol ordered     [name] Jester, PA-C  06/09/2020 7:38 AM  Patient seen and examined with the above-signed Advanced Practice Provider and/or Housestaff. I personally reviewed laboratory data, imaging studies and relevant notes. I independently examined the patient and formulated the important aspects of the plan. I have edited the note to reflect any of my changes or salient points. I have personally discussed the plan with the patient and/or family.  Remains on dobutamine and IV abx. Co-ox stable on DBA 1.5. Looks clearer today but says he still feels very weak. Feels bloated. Still requiring straight cath for urinary retention.   General:  Elderly Weak appearing. No resp difficulty HEENT: normal Neck: supple. no JVD. Carotids 2+ bilat; no bruits. No lymphadenopathy or thryomegaly appreciated. Cor: PMI nondisplaced. Regular rate & rhythm. No rubs, gallops or murmurs. Lungs: clear dull at bases Abdomen: soft, nontender, + distended. No hepatosplenomegaly. No bruits or masses. Good bowel sounds. Extremities: no cyanosis, clubbing, rash, trace edema Neuro: alert & orientedx3, cranial nerves grossly intact. moves all 4 extremities w/o difficulty. Affect pleasant  Overall seems brighter today and more clear. However he remains very weak. Would continue aggressive care this week with TFs, dobutamine, marinol  and PT/OT. I am hopeful he still has a chance to bounce back but realize the odds are tough at his age. Will give sorbitol today. Continue DBA and IV lasix. Will start Flomax for urinary retention.  [name] Bickers, MD  1:13 PM

## 2020-06-09 NOTE — Plan of Care (Signed)
  Problem: Education: Goal: Knowledge of disease and its progression will improve Outcome: Progressing   Problem: Fluid Volume: Goal: Compliance with measures to maintain balanced fluid volume will improve Outcome: Progressing   Problem: Education: Goal: Knowledge of General Education information will improve Description: Including pain rating scale, medication(s)/side effects and non-pharmacologic comfort measures Outcome: Progressing   Problem: Health Behavior/Discharge Planning: Goal: Ability to manage health-related needs will improve Outcome: Progressing   Problem: Clinical Measurements: Goal: Ability to maintain clinical measurements within normal limits will improve Outcome: Progressing Goal: Will remain free from infection Outcome: Progressing Goal: Diagnostic test results will improve Outcome: Progressing Goal: Respiratory complications will improve Outcome: Progressing Goal: Cardiovascular complication will be avoided Outcome: Progressing   Problem: Activity: Goal: Risk for activity intolerance will decrease Outcome: Progressing   Problem: Nutrition: Goal: Adequate nutrition will be maintained Outcome: Progressing   Problem: Coping: Goal: Level of anxiety will decrease Outcome: Progressing   Problem: Elimination: Goal: Will not experience complications related to bowel motility Outcome: Progressing Goal: Will not experience complications related to urinary retention Outcome: Progressing   Problem: Skin Integrity: Goal: Risk for impaired skin integrity will decrease Outcome: Progressing   Problem: Education: Goal: Ability to demonstrate management of disease process will improve Outcome: Progressing Goal: Ability to verbalize understanding of medication therapies will improve Outcome: Progressing Goal: Individualized Educational Video(s) Outcome: Progressing   Problem: Activity: Goal: Capacity to carry out activities will improve Outcome:  Progressing   Problem: Cardiac: Goal: Ability to achieve and maintain adequate cardiopulmonary perfusion will improve Outcome: Progressing   Problem: Education: Goal: Will demonstrate proper wound care and an understanding of methods to prevent future damage Outcome: Progressing Goal: Knowledge of disease or condition will improve Outcome: Progressing Goal: Knowledge of the prescribed therapeutic regimen will improve Outcome: Progressing Goal: Individualized Educational Video(s) Outcome: Progressing   Problem: Activity: Goal: Risk for activity intolerance will decrease Outcome: Progressing   Problem: Cardiac: Goal: Will achieve and/or maintain hemodynamic stability Outcome: Progressing   Problem: Clinical Measurements: Goal: Postoperative complications will be avoided or minimized Outcome: Progressing   Problem: Respiratory: Goal: Respiratory status will improve Outcome: Progressing   Problem: Skin Integrity: Goal: Wound healing without signs and symptoms of infection Outcome: Progressing Goal: Risk for impaired skin integrity will decrease Outcome: Progressing   Problem: Urinary Elimination: Goal: Ability to achieve and maintain adequate renal perfusion and functioning will improve Outcome: Progressing

## 2020-06-09 NOTE — Progress Notes (Signed)
Patient ID: [name] [name], male   DOB: 1927/06/30, 86 y.o.   MRN: [id number] TCTS Evening Rounds:  Hemodynamics stable on dobut 1.5. CVP 11.  Continues to make good uo but requiring straight cath.

## 2020-06-09 NOTE — Progress Notes (Signed)
Pharmacy Antibiotic Note  Ranard Harte is a 86 y.o. male admitted on 05/16/2020 s/p bAVR and CABG, now with WBC 19> improved wnl and opacity on cxr -imprved, remains afebrile today .  Pharmacy has been consulted for Vancomycin and cefepime dosing. Cr 1.3, CrCl 63ml/min Continue current abx will discuss 5 days course for HCAP  Plan: Cefepime 2GM q12h Vancomycin 1gm q24h est AUC 490  Height: 5\' 9"  (175.3 cm) Weight: 79.2 kg (174 lb 9.7 oz) IBW/kg (Calculated) : 70.7  Temp (24hrs), Avg:97.8 F (36.6 C), Min:97.4 F (36.3 C), Max:98.3 F (36.8 C)  Recent Labs  Lab 06/05/20 0334 06/06/20 0351 06/07/20 0247 06/07/20 1015 06/08/20 0500 06/09/20 0406  WBC  --  19.9*  --  14.0* 10.8* 10.4  CREATININE 1.58* 1.29* 1.07  --  1.13 1.25*    Estimated Creatinine Clearance: 39.3 mL/min (A) (by C-G formula based on SCr of 1.25 mg/dL (H)).    Allergies  Allergen Reactions  . Simvastatin Other (See Comments)    "caused neuropathy pain"    Antimicrobials this admission:  Dose adjustments this admission:   Microbiology results:    Bonnita Nasuti Pharm.D. CPP, BCPS Clinical Pharmacist [phone] 06/09/2020 1:59 PM

## 2020-06-10 LAB — COOXEMETRY PANEL
Carboxyhemoglobin: 1.4 % (ref 0.5–1.5)
Methemoglobin: 0.7 % (ref 0.0–1.5)
O2 Saturation: 73.2 %
Total hemoglobin: 8.9 g/dL — ABNORMAL LOW (ref 12.0–16.0)

## 2020-06-10 LAB — GLUCOSE, CAPILLARY
Glucose-Capillary: 121 mg/dL — ABNORMAL HIGH (ref 70–99)
Glucose-Capillary: 212 mg/dL — ABNORMAL HIGH (ref 70–99)
Glucose-Capillary: 185 mg/dL — ABNORMAL HIGH (ref 70–99)
Glucose-Capillary: 98 mg/dL (ref 70–99)

## 2020-06-10 LAB — BASIC METABOLIC PANEL
Anion gap: 7 (ref 5–15)
BUN: 42 mg/dL — ABNORMAL HIGH (ref 8–23)
CO2: 34 mmol/L — ABNORMAL HIGH (ref 22–32)
Calcium: 8.2 mg/dL — ABNORMAL LOW (ref 8.9–10.3)
Chloride: 99 mmol/L (ref 98–111)
Creatinine, Ser: 1.28 mg/dL — ABNORMAL HIGH (ref 0.61–1.24)
GFR, Estimated: 53 mL/min — ABNORMAL LOW (ref >60)
Glucose, Bld: 138 mg/dL — ABNORMAL HIGH (ref 70–99)
Potassium: 4.4 mmol/L (ref 3.5–5.1)
Sodium: 140 mmol/L (ref 135–145)

## 2020-06-10 LAB — CBC
HCT: 27.5 % — ABNORMAL LOW (ref 39.0–52.0)
Hemoglobin: 8.9 g/dL — ABNORMAL LOW (ref 13.0–17.0)
MCH: 32.2 pg (ref 26.0–34.0)
MCHC: 32.4 g/dL (ref 30.0–36.0)
MCV: 99.6 fL (ref 80.0–100.0)
Platelets: 152 10*3/uL (ref 150–400)
RBC: 2.76 MIL/uL — ABNORMAL LOW (ref 4.22–5.81)
RDW: 15.9 % — ABNORMAL HIGH (ref 11.5–15.5)
WBC: 9.4 10*3/uL (ref 4.0–10.5)
nRBC: 0 % (ref 0.0–0.2)

## 2020-06-10 MED ORDER — FUROSEMIDE 10 MG/ML IJ SOLN
40.0000 mg | Freq: Every day | INTRAMUSCULAR | Status: DC
  Administered 2020-06-11 – 2020-06-12 (×2): 40 mg via INTRAVENOUS
  Filled 2020-06-10 (×2): qty 4

## 2020-06-10 NOTE — Progress Notes (Signed)
Occupational Therapy Treatment Patient Details Name: [name] [name] MRN: [id number] DOB: 26-May-1928 Today's Date: 06/10/2020    History of present illness 86 yo male presenting to PCP with worsening LE edema and dyspnea. S/p right/left heart cath and coronary angiography on 11/22. S/p CABG x4 on 11/26. PMH including venous insufficiency with chronic lower extremity edema (mostly on the left), hypertension, hyperlipidemia, peripheral neuropathy, Bil TKA,  essential tremor, and prostate cancer.   OT comments  Pt progressing towards established OT goals. Reviewing sternal precautions as pt with difficulty verbalizing them. Pt demonstrating functional progress to perform functional mobiltiy in hallway with RW instead of eva walker. Pt requiring one seated rest beak and Min A for mobility. Pt with bowel incontinence and requiring Max A for peri care. VSS throughout on 4L. Continue to recommend dc to SNF and will continue to follow acutely as admitted.    Follow Up Recommendations  SNF    Equipment Recommendations  3 in 1 bedside commode;Wheelchair (measurements OT);Wheelchair cushion (measurements OT)    Recommendations for Other Services PT consult    Precautions / Restrictions Precautions Precautions: Fall;Sternal Precaution Comments: Reviewed sternal precautions Restrictions Other Position/Activity Restrictions: sternal       Mobility Bed Mobility Overal bed mobility: Needs Assistance             General bed mobility comments: in chair  Transfers Overall transfer level: Needs assistance Equipment used: Ambulation equipment used (EVA walker) Transfers: Sit to/from [facility] Sit to Stand: Mod assist         General transfer comment: power up assist but steadier once up.    Balance Overall balance assessment: Needs assistance Sitting-balance support: No upper extremity supported;Feet supported Sitting balance-Leahy Scale: Fair     Standing balance  support: Bilateral upper extremity supported Standing balance-Leahy Scale: Poor Standing balance comment: requires external assist and B UE to maintain standing balance                           ADL either performed or assessed with clinical judgement   ADL Overall ADL's : Needs assistance/impaired                     Lower Body Dressing: Maximal assistance;Sit to/from stand Lower Body Dressing Details (indicate cue type and reason): Max A for donning/doffing socks Toilet Transfer: Moderate assistance;Ambulation;RW (simulated to recliner)   Toileting- Clothing Manipulation and Hygiene: Maximal assistance;Sit to/from stand Toileting - Clothing Manipulation Details (indicate cue type and reason): Max A for peri care after bowel incontience     Functional mobility during ADLs: Minimal assistance;Rolling walker General ADL Comments: Pt continues to present with decreased strength, balance, and activity tolerance. Demonsatrating progress to perform mobility with RW instead of eva walker. Pt fatiguing more quickyl with RW     Vision       Perception     Praxis      Cognition Arousal/Alertness: Awake/alert Behavior During Therapy: WFL for tasks assessed/performed Overall Cognitive Status: Within Functional Limits for tasks assessed                                          Exercises General Exercises - Lower Extremity Long Arc Quad: Both;10 reps;Seated;AROM Hip Flexion/Marching: 10 reps;Seated;Both;AROM   Shoulder Instructions       General Comments VSS on 4L.  Pertinent Vitals/ Pain       Pain Assessment: Faces Faces Pain Scale: Hurts a little bit Pain Location: chest surgical site Pain Descriptors / Indicators: Tender Pain Intervention(s): Monitored during session;Limited activity within patient's tolerance;Repositioned  Home Living                                          Prior Functioning/Environment               Frequency  Min 2X/week        Progress Toward Goals  OT Goals(current goals can now be found in the care plan section)  Progress towards OT goals: Progressing toward goals  Acute Rehab OT Goals Patient Stated Goal: I know I need to get stronger OT Goal Formulation: With patient Time For Goal Achievement: 06/09/20 Potential to Achieve Goals: Good ADL Goals Pt Will Perform Grooming: with min assist;standing Pt Will Perform Upper Body Dressing: with min assist;sitting Pt Will Perform Lower Body Dressing: with min assist;with adaptive equipment;sit to/from stand Pt Will Transfer to Toilet: with min assist;bedside commode;ambulating Pt Will Perform Toileting - Clothing Manipulation and hygiene: with min assist;sitting/lateral leans;sit to/from stand  Plan Discharge plan remains appropriate    Co-evaluation                 AM-PAC OT "6 Clicks" Daily Activity     Outcome Measure   Help from another person eating meals?: A Little Help from another person taking care of personal grooming?: A Little Help from another person toileting, which includes using toliet, bedpan, or urinal?: A Lot Help from another person bathing (including washing, rinsing, drying)?: A Lot Help from another person to put on and taking off regular upper body clothing?: A Lot Help from another person to put on and taking off regular lower body clothing?: A Lot 6 Click Score: 14    End of Session Equipment Utilized During Treatment: Gait belt;Rolling walker;Oxygen  OT Visit Diagnosis: Unsteadiness on feet (R26.81);Other abnormalities of gait and mobility (R26.89);Muscle weakness (generalized) (M62.81);Pain Pain - part of body:  (Chest)   Activity Tolerance Patient limited by fatigue   Patient Left in chair;with call bell/phone within reach;with nursing/sitter in room   Nurse Communication Mobility status        Time: 7616-0737 OT Time Calculation (min): 32 min  Charges: OT  General Charges $OT Visit: 1 Visit OT Treatments $Therapeutic Activity: 23-37 mins  [place], OTR/L Acute Rehab Pager: [phone] Office: [place] 06/10/2020, 3:17 PM

## 2020-06-10 NOTE — Progress Notes (Signed)
Patient ID: [name] [name], male   DOB: [birth year], 86 y.o.   MRN: [id number]     Advanced Heart Failure Rounding Note  PCP-Cardiologist: Peter Martinique, MD   Subjective:    Remains on dobutamine 1.5. Co-ox 73% today.  Good UOP w/ IV Lasix. CVP 8-9   Feeling better today. Was able to walk 3 times yesterday. + BM x 2  Appetite improving. Still feels weak  WBC improving on abx   Objective:   Weight Range: 71.1 kg Body mass index is 23.15 kg/m.   Vital Signs:   Temp:  [97.5 F (36.4 C)-98.8 F (37.1 C)] 98.8 F (37.1 C) (12/14 0333) Pulse Rate:  [60-118] 83 (12/14 0700) Resp:  [18-34] 28 (12/14 0700) BP: (89-130)/(55-82) 121/78 (12/14 0700) SpO2:  [91 %-98 %] 96 % (12/14 0700) Weight:  [71.1 kg] 71.1 kg (12/14 0645) Last BM Date: 06/09/20  Weight change: Filed Weights   06/08/20 0500 06/09/20 0500 06/10/20 0645  Weight: 79.5 kg 79.2 kg 71.1 kg    Intake/Output:   Intake/Output Summary (Last 24 hours) at 06/10/2020 1014 Last data filed at 06/10/2020 0700 Gross per 24 hour  Intake 981.92 ml  Output 1585 ml  Net -603.08 ml      Physical Exam   PHYSICAL EXAM: CVP 8-9 General:  fatigue appearing elderly WM sitting up in chair. No respiratory difficulty HEENT: normal + cor trak  Neck: supple. no JVP 8-9. Carotids 2+ bilat; no bruits. No lymphadenopathy or thryomegaly appreciated. Cor: PMI nondisplaced. Regular rate & rhythm. No rubs, gallops or murmurs. Lungs: clear Abdomen: soft, nontender, mildlydistended. No hepatosplenomegaly. No bruits or masses. Good bowel sounds. Extremities: no cyanosis, clubbing, rash, edema Neuro: alert & orientedx3, cranial nerves grossly intact. moves all 4 extremities w/o difficulty. Affect pleasant    Telemetry   Sinus with long AVBlock 70s Personally reviewed  Labs    CBC Recent Labs    06/09/20 0406 06/10/20 0422  WBC 10.4 9.4  HGB 9.4* 8.9*  HCT 28.4* 27.5*  MCV 97.3 99.6  PLT 177 283   Basic Metabolic Panel Recent  Labs    06/09/20 0406 06/10/20 0422  NA 138 140  K 4.4 4.4  CL 98 99  CO2 31 34*  GLUCOSE 165* 138*  BUN 38* 42*  CREATININE 1.25* 1.28*  CALCIUM 8.2* 8.2*   Liver Function Tests No results for input(s): AST, ALT, ALKPHOS, BILITOT, PROT, ALBUMIN in the last 72 hours. No results for input(s): LIPASE, AMYLASE in the last 72 hours. Cardiac Enzymes No results for input(s): CKTOTAL, CKMB, CKMBINDEX, TROPONINI in the last 72 hours.  BNP: BNP (last 3 results) Recent Labs    11/19/19 1550 05/16/20 1736  BNP 1,139.7* 2,005.2*    ProBNP (last 3 results) Recent Labs    01/02/20 1134  PROBNP 1,167.0*     D-Dimer No results for input(s): DDIMER in the last 72 hours. Hemoglobin A1C No results for input(s): HGBA1C in the last 72 hours. Fasting Lipid Panel No results for input(s): CHOL, HDL, LDLCALC, TRIG, CHOLHDL, LDLDIRECT in the last 72 hours. Thyroid Function Tests No results for input(s): TSH, T4TOTAL, T3FREE, THYROIDAB in the last 72 hours.  Invalid input(s): FREET3  Other results:   Imaging    No results found.   Medications:     Scheduled Medications: . aspirin EC  81 mg Oral Daily  . Chlorhexidine Gluconate Cloth  6 each Topical Daily  . docusate sodium  200 mg Oral Daily  . dronabinol  5  mg Oral BID AC  . escitalopram  5 mg Oral Daily  . feeding supplement  237 mL Oral BID BM  . feeding supplement (PROSource TF)  45 mL Per Tube BID  . ferrous [id number] C-folic acid  1 capsule Oral BID PC  . [START ON 06/11/2020] furosemide  40 mg Intravenous Daily  . insulin aspart  0-15 Units Subcutaneous Q4H  . mouth rinse  15 mL Mouth Rinse BID  . midodrine  10 mg Oral TID  . multivitamin with minerals  1 tablet Oral Daily  . pantoprazole  40 mg Oral Daily  . tamsulosin  0.4 mg Oral Daily    Infusions: . sodium chloride Stopped (06/08/20 0941)  . ceFEPime (MAXIPIME) IV 2 g (06/10/20 1007)  . DOBUTamine 1.5 mcg/kg/min (06/10/20 0700)  . feeding  supplement (OSMOLITE 1.5 CAL) 50 mL/hr at 06/09/20 1200  . vancomycin Stopped (06/09/20 0922)    PRN Medications: sodium chloride, bisacodyl, Gerhardt's butt cream, melatonin, ondansetron (ZOFRAN) IV, sodium chloride flush, traMADol   Assessment/Plan   1. Severe 3V CAD: - s/p CABGx 4 (LIMA-LAD, sequential SVG-OM1 and OM2, SVG- PDA) - No s/s angin - continue ASA 81 - no ? blocker w/ post-cardiotomy shock and low BP - Atorva started 12/7 and stopped 12/8. Doubt this is contributing to his weakness but ok to hold   2. Aortic Stenosis: - severe low flow low gradient AS - s/p tissue AVR 11/26  - AV prosthesis ok on post op echo - stable  3. Acute on Chronic Biventricular Heart Failure:  - Echo in 2014 showed normal LVEF 55-60% w/ G1DD.  - Echo 10/21 and EF 30-35% w/ global HK. RV systolic function mildly reduced. - LHC this admit w/ severe 3V CAD>>ICM. Now s/p CABG. Also w/ severe AS, possibly contributing to LV dysfunction, now s/p SAVR.  - post op recovery c/b difficulties weaning pressors/inotropes.  - Post-op echo 12/1 EF 30-35%, RV mildly reduced. AV prothesis ok - off NE and dopamine - DBA 2.5 added 12/9 for low co-ox (43%). Remains on dobutamine at 1.5 mcg/kg/min. Co-ox 73% today. Will continue for now - On midodrine for BP support - Off digoxin with ? heart block.  - CVP 8-9 decrease lasix to once daily 40 IV - Continue UNNA boots.  - hypotension limits use of ARNi/ARB and spiro   4. Post Operative Atrial Fibrillation:   - Monitor on tele while on DBA - seems like sins today - defer anticoagulation to CT surgery    5. AKI:   - Baseline <1.0.  - stable at 1.2 - monitor   6. Hyponatremia - resolved  7. Anemia - expected ABLA from surgery  -  on PO Fe  - Hgb stable   8. Debility - Major issue currently. PT/OT following.  - Recommending SNF.  - seems to be improving slightly  9. Urinary Retention - Prior to admit I&O cath at home.   10. PNA  - CXR w/  bilateral infiltrates, PCT 0.26  - on Vanc + cefepime - AF, WBC trending down   11. Rhythm - looks like sinus with long 1AVB - Digoxin discontinued. HR in 70s currently -  12. Constipation  - Sorbtibol ordered  - continue to mobilize    Glori Bickers, MD 06/10/2020 10:14 AM

## 2020-06-10 NOTE — Progress Notes (Addendum)
TCTS DAILY ICU PROGRESS NOTE                   [address]            [place] [address]          [phone]   18 Days Post-Op Procedure(s) (LRB): AORTIC VALVE REPLACEMENT (AVR) USING EDWARDS RESILIA 29 MM AORTIC VALVE. (N/A) CORONARY ARTERY BYPASS GRAFTING (CABG) USING LIMA to LAD; ENDOSCOPIC HARVESTED RIGHT GREATER SAPHENOUS VEIN: SVG to PD; SVG to SEQUENCED INTERMEDIATE to OM (N/A) TRANSESOPHAGEAL ECHOCARDIOGRAM (TEE) (N/A) ENDOVEIN HARVEST OF GREATER SAPHENOUS VEIN (Right)  Total Length of Stay:  LOS: 25 days   Subjective: conts to feel pretty weak but some slow improvement  Objective: Vital signs in last 24 hours: Temp:  [97.5 F (36.4 C)-98.8 F (37.1 C)] 98.8 F (37.1 C) (12/14 0333) Pulse Rate:  [60-118] 79 (12/14 1215) Cardiac Rhythm: Normal sinus rhythm (12/13 2045) Resp:  [18-39] 19 (12/14 1215) BP: (89-130)/(55-95) 104/68 (12/14 1215) SpO2:  [91 %-100 %] 98 % (12/14 1215) Weight:  [71.1 kg] 71.1 kg (12/14 0645)  Filed Weights   06/08/20 0500 06/09/20 0500 06/10/20 0645  Weight: 79.5 kg 79.2 kg 71.1 kg    Weight change: -8.1 kg   Hemodynamic parameters for last 24 hours: CVP:  [7 mmHg-32 mmHg] 30 mmHg  Intake/Output from previous day: 12/13 0701 - 12/14 0700 In: 1937.1 [P.O.:720; I.V.:42.1; NG/GT:775; IV Piggyback:399.9] Out: 2685 [Urine:2685]  Intake/Output this shift: No intake/output data recorded.  Current Meds: Scheduled Meds: . aspirin EC  81 mg Oral Daily  . Chlorhexidine Gluconate Cloth  6 each Topical Daily  . docusate sodium  200 mg Oral Daily  . dronabinol  5 mg Oral BID AC  . escitalopram  5 mg Oral Daily  . feeding supplement  237 mL Oral BID BM  . feeding supplement (PROSource TF)  45 mL Per Tube BID  . ferrous [id number] C-folic acid  1 capsule Oral BID PC  . [START ON 06/11/2020] furosemide  40 mg Intravenous Daily  . insulin aspart  0-15 Units Subcutaneous Q4H  . mouth rinse  15 mL Mouth Rinse BID   . midodrine  10 mg Oral TID  . multivitamin with minerals  1 tablet Oral Daily  . pantoprazole  40 mg Oral Daily  . tamsulosin  0.4 mg Oral Daily   Continuous Infusions: . sodium chloride Stopped (06/08/20 0941)  . ceFEPime (MAXIPIME) IV 2 g (06/10/20 1007)  . DOBUTamine 1.5 mcg/kg/min (06/10/20 0700)  . feeding supplement (OSMOLITE 1.5 CAL) 1,000 mL (06/10/20 1214)  . vancomycin 1,000 mg (06/10/20 1135)   PRN Meds:.sodium chloride, bisacodyl, Gerhardt's butt cream, melatonin, ondansetron (ZOFRAN) IV, sodium chloride flush, traMADol  General appearance: alert, NAD fatigues easily Heart: regular rate and rhythm Lungs: dim in bases Abdomen: soft, non tender Extremities: Unna bootts in place Wound: incis healing well  Lab Results: CBC: Recent Labs    06/09/20 0406 06/10/20 0422  WBC 10.4 9.4  HGB 9.4* 8.9*  HCT 28.4* 27.5*  PLT 177 152   BMET:  Recent Labs    06/09/20 0406 06/10/20 0422  NA 138 140  K 4.4 4.4  CL 98 99  CO2 31 34*  GLUCOSE 165* 138*  BUN 38* 42*  CREATININE 1.25* 1.28*  CALCIUM 8.2* 8.2*    CMET: Lab Results  Component Value Date   WBC 9.4 06/10/2020   HGB 8.9 (L) 06/10/2020   HCT 27.5 (L) 06/10/2020  PLT 152 06/10/2020   GLUCOSE 138 (H) 06/10/2020   ALT 20 06/02/2020   AST 26 06/02/2020   NA 140 06/10/2020   K 4.4 06/10/2020   CL 99 06/10/2020   CREATININE 1.28 (H) 06/10/2020   BUN 42 (H) 06/10/2020   CO2 34 (H) 06/10/2020   TSH 0.94 10/02/2012   INR 1.3 (H) 05/23/2020   HGBA1C 6.2 (H) 05/16/2020      PT/INR: No results for input(s): LABPROT, INR in the last 72 hours. Radiology: No results found.   Assessment/Plan: S/P Procedure(s) (LRB): AORTIC VALVE REPLACEMENT (AVR) USING EDWARDS RESILIA 29 MM AORTIC VALVE. (N/A) CORONARY ARTERY BYPASS GRAFTING (CABG) USING LIMA to LAD; ENDOSCOPIC HARVESTED RIGHT GREATER SAPHENOUS VEIN: SVG to PD; SVG to SEQUENCED INTERMEDIATE to OM (N/A) TRANSESOPHAGEAL ECHOCARDIOGRAM (TEE)  (N/A) ENDOVEIN HARVEST OF GREATER SAPHENOUS VEIN (Right)   1 steady overall improvement 2 conts advanced heart failure team management of chronic biventricular HF Remains on low dose dobut with excellent C0-OX 73. Also on midodrine for BP assist.  3 afib- sinus rhythm currently. Long  1 deg AVD- dig has been stopped 4 afeb, no leukocytosis, on vanco and cefepime for Pneumonia, no new CXR today. Sats good on 3 liters [place] 5 creat up slightly/BUN rising, may be a bit intravasc dry. Conts significant diuresis, developing some metabolic alkalosis- may need to stop lasix . 6 conts rehab modalities- working with PT, SNF at discharge 7 Anemia is relatively stable- on Trinsicon 8 BS adeq controlled   John Giovanni PA-C Pager [phone] 06/10/2020 12:50 PM   Pt. Seen and examined; agree with documentation. Stable day; excellent UOP. Continues to make slow progress. Keshonda Monsour Z. Orvan Seen, [place]

## 2020-06-10 NOTE — Progress Notes (Signed)
Patient resting in chair at end of shift eating breakfast. Patient A&O x4, continues on [place] @ 5L. Patient denies any pain or distress at this time. Patient gait and balance continues to be unsteady. Patient was straight cathed once this shift, see output flow sheet. Last bladder scan at [place] with 380ml. No BM noted on this shift. Patient continues to be educated on mobility and how to move post CABG. Safety measures remain in place and call light within reach. Will continue to monitor and SBARR to day shift nurse.

## 2020-06-10 NOTE — Plan of Care (Signed)
  Problem: Education: Goal: Knowledge of disease and its progression will improve Outcome: Progressing   Problem: Fluid Volume: Goal: Compliance with measures to maintain balanced fluid volume will improve Outcome: Progressing   Problem: Education: Goal: Knowledge of General Education information will improve Description: Including pain rating scale, medication(s)/side effects and non-pharmacologic comfort measures Outcome: Progressing   Problem: Health Behavior/Discharge Planning: Goal: Ability to manage health-related needs will improve Outcome: Progressing   Problem: Clinical Measurements: Goal: Ability to maintain clinical measurements within normal limits will improve Outcome: Progressing Goal: Will remain free from infection Outcome: Progressing Goal: Diagnostic test results will improve Outcome: Progressing Goal: Respiratory complications will improve Outcome: Progressing Goal: Cardiovascular complication will be avoided Outcome: Progressing   Problem: Activity: Goal: Risk for activity intolerance will decrease Outcome: Progressing   Problem: Nutrition: Goal: Adequate nutrition will be maintained Outcome: Progressing   Problem: Coping: Goal: Level of anxiety will decrease Outcome: Progressing   Problem: Elimination: Goal: Will not experience complications related to bowel motility Outcome: Progressing Goal: Will not experience complications related to urinary retention Outcome: Progressing   Problem: Skin Integrity: Goal: Risk for impaired skin integrity will decrease Outcome: Progressing   Problem: Education: Goal: Ability to demonstrate management of disease process will improve Outcome: Progressing Goal: Ability to verbalize understanding of medication therapies will improve Outcome: Progressing Goal: Individualized Educational Video(s) Outcome: Progressing   Problem: Activity: Goal: Capacity to carry out activities will improve Outcome:  Progressing   Problem: Cardiac: Goal: Ability to achieve and maintain adequate cardiopulmonary perfusion will improve Outcome: Progressing   Problem: Education: Goal: Will demonstrate proper wound care and an understanding of methods to prevent future damage Outcome: Progressing Goal: Knowledge of disease or condition will improve Outcome: Progressing Goal: Knowledge of the prescribed therapeutic regimen will improve Outcome: Progressing Goal: Individualized Educational Video(s) Outcome: Progressing   Problem: Activity: Goal: Risk for activity intolerance will decrease Outcome: Progressing   Problem: Cardiac: Goal: Will achieve and/or maintain hemodynamic stability Outcome: Progressing   Problem: Clinical Measurements: Goal: Postoperative complications will be avoided or minimized Outcome: Progressing   Problem: Respiratory: Goal: Respiratory status will improve Outcome: Progressing   Problem: Skin Integrity: Goal: Wound healing without signs and symptoms of infection Outcome: Progressing Goal: Risk for impaired skin integrity will decrease Outcome: Progressing   Problem: Urinary Elimination: Goal: Ability to achieve and maintain adequate renal perfusion and functioning will improve Outcome: Progressing

## 2020-06-11 ENCOUNTER — Other Ambulatory Visit: Payer: Self-pay | Admitting: Surgery

## 2020-06-11 DIAGNOSIS — Z951 Presence of aortocoronary bypass graft: Secondary | ICD-10-CM

## 2020-06-11 LAB — COOXEMETRY PANEL
Carboxyhemoglobin: 1.7 % — ABNORMAL HIGH (ref 0.5–1.5)
Carboxyhemoglobin: 1.5 % (ref 0.5–1.5)
Methemoglobin: 1.1 % (ref 0.0–1.5)
Methemoglobin: 1 % (ref 0.0–1.5)
O2 Saturation: 90.4 %
O2 Saturation: 55.4 %
Total hemoglobin: 6.8 g/dL — CL (ref 12.0–16.0)
Total hemoglobin: 8.1 g/dL — ABNORMAL LOW (ref 12.0–16.0)

## 2020-06-11 LAB — BASIC METABOLIC PANEL
Anion gap: 10 (ref 5–15)
BUN: 44 mg/dL — ABNORMAL HIGH (ref 8–23)
CO2: 31 mmol/L (ref 22–32)
Calcium: 8.4 mg/dL — ABNORMAL LOW (ref 8.9–10.3)
Chloride: 99 mmol/L (ref 98–111)
Creatinine, Ser: 1.19 mg/dL (ref 0.61–1.24)
GFR, Estimated: 58 mL/min — ABNORMAL LOW (ref >60)
Glucose, Bld: 138 mg/dL — ABNORMAL HIGH (ref 70–99)
Potassium: 4.5 mmol/L (ref 3.5–5.1)
Sodium: 140 mmol/L (ref 135–145)

## 2020-06-11 LAB — CBC
HCT: 28.2 % — ABNORMAL LOW (ref 39.0–52.0)
Hemoglobin: 8.5 g/dL — ABNORMAL LOW (ref 13.0–17.0)
MCH: 30.7 pg (ref 26.0–34.0)
MCHC: 30.1 g/dL (ref 30.0–36.0)
MCV: 101.8 fL — ABNORMAL HIGH (ref 80.0–100.0)
Platelets: 147 10*3/uL — ABNORMAL LOW (ref 150–400)
RBC: 2.77 MIL/uL — ABNORMAL LOW (ref 4.22–5.81)
RDW: 15.6 % — ABNORMAL HIGH (ref 11.5–15.5)
WBC: 8.4 10*3/uL (ref 4.0–10.5)
nRBC: 0 % (ref 0.0–0.2)

## 2020-06-11 LAB — GLUCOSE, CAPILLARY
Glucose-Capillary: 155 mg/dL — ABNORMAL HIGH (ref 70–99)
Glucose-Capillary: 156 mg/dL — ABNORMAL HIGH (ref 70–99)
Glucose-Capillary: 114 mg/dL — ABNORMAL HIGH (ref 70–99)
Glucose-Capillary: 197 mg/dL — ABNORMAL HIGH (ref 70–99)
Glucose-Capillary: 73 mg/dL (ref 70–99)
Glucose-Capillary: 198 mg/dL — ABNORMAL HIGH (ref 70–99)

## 2020-06-11 MED ORDER — ENSURE ENLIVE PO LIQD
237.0000 mL | Freq: Three times a day (TID) | ORAL | Status: DC
  Administered 2020-06-11 – 2020-06-24 (×38): 237 mL via ORAL

## 2020-06-11 MED ORDER — TRAZODONE HCL 50 MG PO TABS
50.0000 mg | ORAL_TABLET | Freq: Every day | ORAL | Status: DC
  Administered 2020-06-11: 50 mg via ORAL
  Filled 2020-06-11: qty 1

## 2020-06-11 MED ORDER — OSMOLITE 1.5 CAL PO LIQD
1000.0000 mL | ORAL | Status: DC
  Administered 2020-06-11 – 2020-06-17 (×7): 1000 mL
  Filled 2020-06-11 (×5): qty 1000

## 2020-06-11 NOTE — Progress Notes (Signed)
TCTS DAILY ICU PROGRESS NOTE                   [address]            [place] [address]          [phone]   19 Days Post-Op Procedure(s) (LRB): AORTIC VALVE REPLACEMENT (AVR) USING EDWARDS RESILIA 29 MM AORTIC VALVE. (N/A) CORONARY ARTERY BYPASS GRAFTING (CABG) USING LIMA to LAD; ENDOSCOPIC HARVESTED RIGHT GREATER SAPHENOUS VEIN: SVG to PD; SVG to SEQUENCED INTERMEDIATE to OM (N/A) TRANSESOPHAGEAL ECHOCARDIOGRAM (TEE) (N/A) ENDOVEIN HARVEST OF GREATER SAPHENOUS VEIN (Right)  Total Length of Stay:  LOS: 26 days   Subjective: fatigued  Objective: Vital signs in last 24 hours: Temp:  [98.2 F (36.8 C)-99.6 F (37.6 C)] 99.4 F (37.4 C) (12/15 0753) Pulse Rate:  [69-90] 69 (12/15 0500) Cardiac Rhythm: Normal sinus rhythm (12/15 0421) Resp:  [19-37] 37 (12/15 0500) BP: (88-116)/(56-95) 88/56 (12/15 0500) SpO2:  [92 %-98 %] 96 % (12/15 0500) Weight:  [71.1 kg] 71.1 kg (12/15 0500)  Filed Weights   06/09/20 0500 06/10/20 0645 06/11/20 0500  Weight: 79.2 kg 71.1 kg 71.1 kg    Weight change: 0 kg   Hemodynamic parameters for last 24 hours: CVP:  [0 mmHg-32 mmHg] 0 mmHg  Intake/Output from previous day: 12/14 0701 - 12/15 0700 In: 3030.4 [P.O.:840; I.V.:40.4; NG/GT:1750; IV Piggyback:400.1] Out: 2200 [Urine:2200]  Intake/Output this shift: No intake/output data recorded.  Current Meds: Scheduled Meds: . aspirin EC  81 mg Oral Daily  . Chlorhexidine Gluconate Cloth  6 each Topical Daily  . docusate sodium  200 mg Oral Daily  . dronabinol  5 mg Oral BID AC  . escitalopram  5 mg Oral Daily  . feeding supplement  237 mL Oral BID BM  . feeding supplement (PROSource TF)  45 mL Per Tube BID  . ferrous [id number] C-folic acid  1 capsule Oral BID PC  . furosemide  40 mg Intravenous Daily  . insulin aspart  0-15 Units Subcutaneous Q4H  . mouth rinse  15 mL Mouth Rinse BID  . midodrine  10 mg Oral TID  . multivitamin with minerals  1 tablet  Oral Daily  . pantoprazole  40 mg Oral Daily  . tamsulosin  0.4 mg Oral Daily   Continuous Infusions: . sodium chloride Stopped (06/08/20 0941)  . ceFEPime (MAXIPIME) IV Stopped (06/10/20 2232)  . DOBUTamine 1.5 mcg/kg/min (06/11/20 0600)  . feeding supplement (OSMOLITE 1.5 CAL) 1,000 mL (06/10/20 1214)  . vancomycin Stopped (06/10/20 1235)   PRN Meds:.sodium chloride, bisacodyl, Gerhardt's butt cream, melatonin, ondansetron (ZOFRAN) IV, sodium chloride flush, traMADol  General appearance: distracted and fatigued Heart: regular rate and rhythm Lungs: clear to auscultation bilaterally Abdomen: benign Extremities: no edema Wound: incis healing well  Lab Results: CBC: Recent Labs    06/10/20 0422 06/11/20 0442  WBC 9.4 8.4  HGB 8.9* 8.5*  HCT 27.5* 28.2*  PLT 152 147*   BMET:  Recent Labs    06/10/20 0422 06/11/20 0442  NA 140 140  K 4.4 4.5  CL 99 99  CO2 34* 31  GLUCOSE 138* 138*  BUN 42* 44*  CREATININE 1.28* 1.19  CALCIUM 8.2* 8.4*    CMET: Lab Results  Component Value Date   WBC 8.4 06/11/2020   HGB 8.5 (L) 06/11/2020   HCT 28.2 (L) 06/11/2020   PLT 147 (L) 06/11/2020   GLUCOSE 138 (H) 06/11/2020   ALT 20 06/02/2020  AST 26 06/02/2020   NA 140 06/11/2020   K 4.5 06/11/2020   CL 99 06/11/2020   CREATININE 1.19 06/11/2020   BUN 44 (H) 06/11/2020   CO2 31 06/11/2020   TSH 0.94 10/02/2012   INR 1.3 (H) 05/23/2020   HGBA1C 6.2 (H) 05/16/2020      PT/INR: No results for input(s): LABPROT, INR in the last 72 hours. Radiology: No results found.   Assessment/Plan: S/P Procedure(s) (LRB): AORTIC VALVE REPLACEMENT (AVR) USING EDWARDS RESILIA 29 MM AORTIC VALVE. (N/A) CORONARY ARTERY BYPASS GRAFTING (CABG) USING LIMA to LAD; ENDOSCOPIC HARVESTED RIGHT GREATER SAPHENOUS VEIN: SVG to PD; SVG to SEQUENCED INTERMEDIATE to OM (N/A) TRANSESOPHAGEAL ECHOCARDIOGRAM (TEE) (N/A) ENDOVEIN HARVEST OF GREATER SAPHENOUS VEIN (Right)  1 Tmax 99.4 , SBP  88-116,CO-OC 55.4 , AHF team assisting with Bivent HF- excellent diuresis, sinus rhythm 2 sats ok with 3 liters [place] 3 creat improved today 4 BS ok control 5 H/H stable, slightly improved 6 minor thrombocytopenia 7 No leukocytosis 8 conts pneumonia RX, cefepime and Vanco 9 cont TF's till po intake better 9 cont PT/OT 10 SNF at d/c     John Giovanni PA-C Pager [phone] 06/11/2020 8:33 AM

## 2020-06-11 NOTE — Progress Notes (Addendum)
Patient ID: [name] [name], male   DOB: Apr 29, 1928, 86 y.o.   MRN: [id number]     Advanced Heart Failure Rounding Note  PCP-Cardiologist: Peter Martinique, MD   Subjective:    Remains on dobutamine 1.5. Co-ox 90%. Repeat now.   Didn't sleep well last night. Walked 3 times yesterday. Denies SOB.   Objective:   Weight Range: 71.1 kg Body mass index is 23.15 kg/m.   Vital Signs:   Temp:  [98.2 F (36.8 C)-99.6 F (37.6 C)] 98.2 F (36.8 C) (12/15 0400) Pulse Rate:  [69-90] 69 (12/15 0500) Resp:  [19-39] 37 (12/15 0500) BP: (88-116)/(56-95) 88/56 (12/15 0500) SpO2:  [92 %-100 %] 96 % (12/15 0500) Weight:  [71.1 kg] 71.1 kg (12/15 0500) Last BM Date: 06/10/20  Weight change: Filed Weights   06/09/20 0500 06/10/20 0645 06/11/20 0500  Weight: 79.2 kg 71.1 kg 71.1 kg    Intake/Output:   Intake/Output Summary (Last 24 hours) at 06/11/2020 7829 Last data filed at 06/11/2020 0600 Gross per 24 hour  Intake 3030.44 ml  Output 2200 ml  Net 830.44 ml      Physical Exam  CVP 10-11 Personally checked.  PHYSICAL EXAM: General:  Elderlry in bed.  No resp difficulty HEENT: [place]  Neck: supple. JVP 10-11. Carotids 2+ bilat; no bruits. No lymphadenopathy or thryomegaly appreciated. Cor: PMI nondisplaced. Regular rate & rhythm. No rubs, gallops or murmurs. Lungs: Decreased in the bases on 3 liters [place]>  Abdomen: soft, nontender, nondistended. No hepatosplenomegaly. No bruits or masses. Good bowel sounds. Extremities: no cyanosis, clubbing, rash, R and LLE trace edema Neuro: alert & orientedx3, cranial nerves grossly intact. moves all 4 extremities w/o difficulty. Affect flat     Telemetry   SR 60-80s   Labs    CBC Recent Labs    06/10/20 0422 06/11/20 0442  WBC 9.4 8.4  HGB 8.9* 8.5*  HCT 27.5* 28.2*  MCV 99.6 101.8*  PLT 152 562*   Basic Metabolic Panel Recent Labs    06/10/20 0422 06/11/20 0442  NA 140 140  K 4.4 4.5  CL 99 99  CO2 34* 31  GLUCOSE 138* 138*   BUN 42* 44*  CREATININE 1.28* 1.19  CALCIUM 8.2* 8.4*   Liver Function Tests No results for input(s): AST, ALT, ALKPHOS, BILITOT, PROT, ALBUMIN in the last 72 hours. No results for input(s): LIPASE, AMYLASE in the last 72 hours. Cardiac Enzymes No results for input(s): CKTOTAL, CKMB, CKMBINDEX, TROPONINI in the last 72 hours.  BNP: BNP (last 3 results) Recent Labs    11/19/19 1550 05/16/20 1736  BNP 1,139.7* 2,005.2*    ProBNP (last 3 results) Recent Labs    01/02/20 1134  PROBNP 1,167.0*     D-Dimer No results for input(s): DDIMER in the last 72 hours. Hemoglobin A1C No results for input(s): HGBA1C in the last 72 hours. Fasting Lipid Panel No results for input(s): CHOL, HDL, LDLCALC, TRIG, CHOLHDL, LDLDIRECT in the last 72 hours. Thyroid Function Tests No results for input(s): TSH, T4TOTAL, T3FREE, THYROIDAB in the last 72 hours.  Invalid input(s): FREET3  Other results:   Imaging    No results found.   Medications:     Scheduled Medications: . aspirin EC  81 mg Oral Daily  . Chlorhexidine Gluconate Cloth  6 each Topical Daily  . docusate sodium  200 mg Oral Daily  . dronabinol  5 mg Oral BID AC  . escitalopram  5 mg Oral Daily  . feeding supplement  237  mL Oral BID BM  . feeding supplement (PROSource TF)  45 mL Per Tube BID  . ferrous [id number] C-folic acid  1 capsule Oral BID PC  . furosemide  40 mg Intravenous Daily  . insulin aspart  0-15 Units Subcutaneous Q4H  . mouth rinse  15 mL Mouth Rinse BID  . midodrine  10 mg Oral TID  . multivitamin with minerals  1 tablet Oral Daily  . pantoprazole  40 mg Oral Daily  . tamsulosin  0.4 mg Oral Daily    Infusions: . sodium chloride Stopped (06/08/20 0941)  . ceFEPime (MAXIPIME) IV Stopped (06/10/20 2232)  . DOBUTamine 1.5 mcg/kg/min (06/11/20 0600)  . feeding supplement (OSMOLITE 1.5 CAL) 1,000 mL (06/10/20 1214)  . vancomycin Stopped (06/10/20 1235)    PRN Medications: sodium  chloride, bisacodyl, Gerhardt's butt cream, melatonin, ondansetron (ZOFRAN) IV, sodium chloride flush, traMADol   Assessment/Plan   1. Severe 3V CAD: - s/p CABGx 4 (LIMA-LAD, sequential SVG-OM1 and OM2, SVG- PDA) - No chest pain.  - continue ASA 81 - no ? blocker w/ post-cardiotomy shock and low BP - Atorva started 12/7 and stopped 12/8. Doubt this is contributing to his weakness but ok to hold   2. Aortic Stenosis: - severe low flow low gradient AS - s/p tissue AVR 11/26  - AV prosthesis ok on post op echo - stable  3. Acute on Chronic Biventricular Heart Failure:  - Echo in 2014 showed normal LVEF 55-60% w/ G1DD.  - Echo 10/21 and EF 30-35% w/ global HK. RV systolic function mildly reduced. - LHC this admit w/ severe 3V CAD>>ICM. Now s/p CABG. Also w/ severe AS, possibly contributing to LV dysfunction, now s/p SAVR.  - post op recovery c/b difficulties weaning pressors/inotropes.  - Post-op echo 12/1 EF 30-35%, RV mildly reduced. AV prothesis ok - off NE - Remains on dobutamine 1.5 mcg. Repeat CO-OX now.   - CVP 11-12. Continue IV lasix.  - On midodrine for BP support - Off digoxin with ? heart block.  - - CVP 11. Continue IV lasix.  - Place ted hose today.   - hypotension limits use of ARNi/ARB and spiro   4. Post Operative Atrial Fibrillation:   - Monitor on tele while on DBA - defer anticoagulation to CT surgery    5. AKI:   - Baseline <1.0.  -Creatinine 1.2 today. Stable.   6. Hyponatremia - resolved  7. Anemia - expected ABLA from surgery  -  on PO Fe  - Hgb stable 8.4   8. Debility - Major issue currently. PT/OT following.  - Recommending SNF.   9. Urinary Retention - Prior to admit I&O cath at home.  - Continues with I&O cath every 6 hours.   10. PNA  - CXR w/ bilateral infiltrates, PCT 0.26  - on Vanc + cefepime - Tmax 99.6, WBC down to 8.4   11. Rhythm - looks like sinus with long 1AVB - Digoxin discontinued.   12. Constipation  -  Resolved.  - continue to mobilize    Darrick Grinder, NP-C  06/11/2020 7:12 AM  Patient seen and examined with the above-signed Advanced Practice Provider and/or Housestaff. I personally reviewed laboratory data, imaging studies and relevant notes. I independently examined the patient and formulated the important aspects of the plan. I have edited the note to reflect any of my changes or salient points. I have personally discussed the plan with the patient and/or family.  Frustrated because he did not  sleep well last night. Tired today. Remains on dobutamine. Co-ox ok. Volume status looks good. Still with low-grade temp  General:  Elderly male sitting in chair No resp difficulty HEENT: normal Neck: supple. no JVD. Carotids 2+ bilat; no bruits. No lymphadenopathy or thryomegaly appreciated. Cor: PMI nondisplaced. Regular rate & rhythm. No rubs, gallops or murmurs. Lungs: clear Abdomen: soft, nontender, nondistended. No hepatosplenomegaly. No bruits or masses. Good bowel sounds. Extremities: no cyanosis, clubbing, rash, edema Neuro: alert & orientedx3, cranial nerves grossly intact. moves all 4 extremities w/o difficulty. Affect pleasant   Remains tenuous but still making progress. Had a little setback with lack of sleep but otherwise ok. Will continue dobutamine. CVP 10-11. Continue IV lasix 40 daily.  Keep abx going for 7 days. Discussed dosing with PharmD personally. Add trazodone for sleep. Continue to mobilize.   Glori Bickers, MD  4:36 PM

## 2020-06-11 NOTE — Plan of Care (Signed)
  Problem: Education: Goal: Knowledge of disease and its progression will improve Outcome: Progressing   Problem: Fluid Volume: Goal: Compliance with measures to maintain balanced fluid volume will improve Outcome: Progressing   Problem: Education: Goal: Knowledge of General Education information will improve Description: Including pain rating scale, medication(s)/side effects and non-pharmacologic comfort measures Outcome: Progressing   Problem: Health Behavior/Discharge Planning: Goal: Ability to manage health-related needs will improve Outcome: Progressing   Problem: Clinical Measurements: Goal: Ability to maintain clinical measurements within normal limits will improve Outcome: Progressing Goal: Will remain free from infection Outcome: Progressing Goal: Diagnostic test results will improve Outcome: Progressing Goal: Respiratory complications will improve Outcome: Progressing Goal: Cardiovascular complication will be avoided Outcome: Progressing   Problem: Activity: Goal: Risk for activity intolerance will decrease Outcome: Progressing   Problem: Nutrition: Goal: Adequate nutrition will be maintained Outcome: Progressing   Problem: Coping: Goal: Level of anxiety will decrease Outcome: Progressing   Problem: Elimination: Goal: Will not experience complications related to bowel motility Outcome: Progressing Goal: Will not experience complications related to urinary retention Outcome: Progressing   Problem: Skin Integrity: Goal: Risk for impaired skin integrity will decrease Outcome: Progressing   Problem: Education: Goal: Ability to demonstrate management of disease process will improve Outcome: Progressing Goal: Ability to verbalize understanding of medication therapies will improve Outcome: Progressing Goal: Individualized Educational Video(s) Outcome: Progressing   Problem: Activity: Goal: Capacity to carry out activities will improve Outcome:  Progressing   Problem: Cardiac: Goal: Ability to achieve and maintain adequate cardiopulmonary perfusion will improve Outcome: Progressing   Problem: Education: Goal: Will demonstrate proper wound care and an understanding of methods to prevent future damage Outcome: Progressing Goal: Knowledge of disease or condition will improve Outcome: Progressing Goal: Knowledge of the prescribed therapeutic regimen will improve Outcome: Progressing Goal: Individualized Educational Video(s) Outcome: Progressing   Problem: Activity: Goal: Risk for activity intolerance will decrease Outcome: Progressing   Problem: Cardiac: Goal: Will achieve and/or maintain hemodynamic stability Outcome: Progressing   Problem: Clinical Measurements: Goal: Postoperative complications will be avoided or minimized Outcome: Progressing   Problem: Respiratory: Goal: Respiratory status will improve Outcome: Progressing   Problem: Skin Integrity: Goal: Wound healing without signs and symptoms of infection Outcome: Progressing Goal: Risk for impaired skin integrity will decrease Outcome: Progressing   Problem: Urinary Elimination: Goal: Ability to achieve and maintain adequate renal perfusion and functioning will improve Outcome: Progressing

## 2020-06-11 NOTE — Progress Notes (Signed)
Orthopedic tech contacted to come replace uniboots.

## 2020-06-11 NOTE — Progress Notes (Signed)
Patient laying in bed awake and oriented near end of shift. Patient did not sleep throughout night despite receiving melatonin. Pt. Continues on [place] at 3L. Patient did get up and walk during shift 1/2 lap around unit using 6L during walk, then sat in chair for bath and CHG scrub before getting back in bed. Feeding continues to infuse at 50 cc/hr per order. Patient u/o 400 via straight cath, last bladder scan a 0645 showing 226 ml. Patient had 2 BM's during shift. Safety measure remain in place, bed locked in lowest position and call light within reach. Will continue to monitor and SBARR to day shift nurse at change of shift.

## 2020-06-11 NOTE — Progress Notes (Signed)
Nutrition Follow-up  DOCUMENTATION CODES:   Non-severe (moderate) malnutrition in context of chronic illness  INTERVENTION:   Ensure Enlive po TID, each supplement provides 350 kcal and 20 grams of protein  Tube Feeding: transition to nocturnal TF Change to Osmolite 1.5 at 55 ml/hr x 12 hours Continue Pro-Source 45 mL BID Provides 1070 kcals, 63 g of protein  NUTRITION DIAGNOSIS:   Moderate Malnutrition related to chronic illness (CHF, LLE edema) as evidenced by mild fat depletion,severe muscle depletion,moderate muscle depletion.  Being addressed via TF, supplements  GOAL:   Patient will meet greater than or equal to 90% of their needs  Progressing  MONITOR:   PO intake,Supplement acceptance,Labs,Weight trends,Skin  REASON FOR ASSESSMENT:   Consult Enteral/tube feeding initiation and management  ASSESSMENT:   Pt with PMH of HTN, HLD, chronic LLE, peripheral neuropathy, essential tremor, and prostate cancer. Presented with CHF exacerbation and severe low flow AS. S/p right/left heart cath and coronary angiography on 11/22. Underwent CABG x4 and tissue AVR on 11/26.   Pt remains on dobutamine  Pt eating a little better per RN; pt ate 50% at breakfast yesterday, 25% at lunch and dinner. Drinking Ensure well; RN indicates pt sips on Ensure all day, as soon as he finished one, RN opens another one and he sips on that til it is done; likely drinking 3 per day per RN  Tolerating Osmolite 1.5 at 50 ml/hr, Pro-Source TF 45 mL BID  Current wt 71.1 kg; down from 79 kg on 12/13. Net negative 11 L. Admit weight around 79-83 kg  Labs: reviewed Meds: marinol, lasix, trinsicon, MVI with minerals   Diet Order:   Diet Order            Diet regular Room service appropriate? Yes; Fluid consistency: Thin  Diet effective now                 EDUCATION NEEDS:   Education needs have been addressed  Skin:  Skin Assessment: Skin Integrity Issues: Skin Integrity Issues:: Stage  II Stage II: sacrum  Last BM:  12/14  Height:   Ht Readings from Last 1 Encounters:  05/23/20 5\' 9"  (1.753 m)    Weight:   Wt Readings from Last 1 Encounters:  06/11/20 71.1 kg    Ideal Body Weight:  72.7 kg  BMI:  Body mass index is 23.15 kg/m.  Estimated Nutritional Needs:   Kcal:  1700-1900  Protein:  90-110  Fluid:  >/= 2 L/day   Kerman Passey MS, RDN, LDN, CNSC Registered Dietitian III Clinical Nutrition RD Pager and On-Call Pager Number Located in [place]

## 2020-06-12 LAB — COOXEMETRY PANEL
Carboxyhemoglobin: 1.6 % — ABNORMAL HIGH (ref 0.5–1.5)
Methemoglobin: 0.6 % (ref 0.0–1.5)
O2 Saturation: 62 %
Total hemoglobin: 9.1 g/dL — ABNORMAL LOW (ref 12.0–16.0)

## 2020-06-12 LAB — CBC
HCT: 28.3 % — ABNORMAL LOW (ref 39.0–52.0)
Hemoglobin: 8.9 g/dL — ABNORMAL LOW (ref 13.0–17.0)
MCH: 31 pg (ref 26.0–34.0)
MCHC: 31.4 g/dL (ref 30.0–36.0)
MCV: 98.6 fL (ref 80.0–100.0)
Platelets: 151 10*3/uL (ref 150–400)
RBC: 2.87 MIL/uL — ABNORMAL LOW (ref 4.22–5.81)
RDW: 15.7 % — ABNORMAL HIGH (ref 11.5–15.5)
WBC: 8.2 10*3/uL (ref 4.0–10.5)
nRBC: 0 % (ref 0.0–0.2)

## 2020-06-12 LAB — BASIC METABOLIC PANEL
Anion gap: 8 (ref 5–15)
BUN: 39 mg/dL — ABNORMAL HIGH (ref 8–23)
CO2: 30 mmol/L (ref 22–32)
Calcium: 8.6 mg/dL — ABNORMAL LOW (ref 8.9–10.3)
Chloride: 100 mmol/L (ref 98–111)
Creatinine, Ser: 1.24 mg/dL (ref 0.61–1.24)
GFR, Estimated: 55 mL/min — ABNORMAL LOW (ref >60)
Glucose, Bld: 121 mg/dL — ABNORMAL HIGH (ref 70–99)
Potassium: 4.5 mmol/L (ref 3.5–5.1)
Sodium: 138 mmol/L (ref 135–145)

## 2020-06-12 LAB — GLUCOSE, CAPILLARY
Glucose-Capillary: 125 mg/dL — ABNORMAL HIGH (ref 70–99)
Glucose-Capillary: 129 mg/dL — ABNORMAL HIGH (ref 70–99)
Glucose-Capillary: 142 mg/dL — ABNORMAL HIGH (ref 70–99)
Glucose-Capillary: 99 mg/dL (ref 70–99)
Glucose-Capillary: 149 mg/dL — ABNORMAL HIGH (ref 70–99)
Glucose-Capillary: 101 mg/dL — ABNORMAL HIGH (ref 70–99)

## 2020-06-12 MED ORDER — TRAZODONE HCL 50 MG PO TABS
100.0000 mg | ORAL_TABLET | Freq: Every day | ORAL | Status: DC
  Administered 2020-06-12: 100 mg via ORAL
  Filled 2020-06-12: qty 2

## 2020-06-12 NOTE — Consult Note (Signed)
[facility] Nurse Consult Note: Patient receiving care in [place] completed remotely after review of chart and [place] with bedside RN Reason for Consult: Sacral wound Wound type:Stage 2  Pressure Injury POA: Yes Measurement: See flow sheet Wound bed: Clean pink Drainage (amount, consistency, odor) None Periwound: intact Dressing procedure/placement/frequency:Clean sacral wound with no rinse cleanser. Pat dry. Apply Xeroform gauze and secure with sacral foam dressing. Change daily.   \Monitor the wound area(s) for worsening of condition such as: Signs/symptoms of infection, increase in size, development of or worsening of odor, development of pain, or increased pain at the affected locations.   Notify the medical team if any of these develop.  Thank you for the consult. [facility] nurse will not follow at this time.   Please re-consult the [facility] team if needed.  Cathlean Marseilles Tamala Julian, MSN, RN, [place], Lysle Pearl, [name] Wound Treatment Associate Pager [phone]

## 2020-06-12 NOTE — Progress Notes (Signed)
Occupational Therapy Treatment Patient Details Name: [name] [name] MRN: [id number] DOB: [birth year] Today's Date: 06/12/2020    History of present illness 86 yo male presenting to PCP with worsening LE edema and dyspnea. S/p right/left heart cath and coronary angiography on 11/22. S/p CABG x4 on 11/26. PMH including venous insufficiency with chronic lower extremity edema (mostly on the left), hypertension, hyperlipidemia, peripheral neuropathy, Bil TKA,  essential tremor, and prostate cancer.   OT comments  Patient off O2 this date.  O2 sats remained above 90% on room air throughout.  Verbal cues given for pursed lip breathing, and not hold his breath during activity.  Patient continues with the deficits below, which are impacting independence, but the patient is tolerating more activity, and is moving with less assist.  Patient was able to sit to stand with Min A and Min Guard for balance.  OT will continue to follow in the acute setting, SNF has been recommended.    Follow Up Recommendations  SNF    Equipment Recommendations  3 in 1 bedside commode;Wheelchair (measurements OT);Wheelchair cushion (measurements OT)    Recommendations for Other Services      Precautions / Restrictions Precautions Precautions: Fall;Sternal Restrictions Weight Bearing Restrictions: No          Balance           Standing balance support: Bilateral upper extremity supported Standing balance-Leahy Scale: Poor Standing balance comment: requires external assist and B UE to maintain standing balance                           ADL either performed or assessed with clinical judgement   ADL       Grooming: Supervision/safety;Set up;Sitting   Upper Body Bathing: Sitting;Moderate assistance       Upper Body Dressing : Sitting;Moderate assistance   Lower Body Dressing: Maximal assistance;Sit to/from stand   Toilet Transfer: Moderate assistance;Ambulation;RW;Minimal assistance            Functional mobility during ADLs: Minimal assistance;Rolling walker                                                                                           Pertinent Vitals/ Pain       Faces Pain Scale: Hurts a little bit Pain Location: chest surgical site Pain Descriptors / Indicators: Jabbing Pain Intervention(s): Monitored during session                                                          Frequency    2x/wk       Progress Toward Goals  OT Goals(current goals can now be found in the care plan section)  Progress towards OT goals: Progressing toward goals  Acute Rehab OT Goals Patient Stated Goal: Get my body to do what my mind wants it to do. OT Goal Formulation: With patient Time For Goal Achievement: 06/23/20 Potential to Achieve Goals: Good  ADL Goals Pt Will Perform Grooming: standing;with min guard assist Pt Will Transfer to Toilet: with min guard assist;bedside commode;ambulating Pt Will Perform Toileting - Clothing Manipulation and hygiene: sit to/from stand;with min assist  Plan Discharge plan remains appropriate    Co-evaluation                 AM-PAC OT "6 Clicks" Daily Activity     Outcome Measure   Help from another person eating meals?: A Little Help from another person taking care of personal grooming?: A Little Help from another person toileting, which includes using toliet, bedpan, or urinal?: A Lot Help from another person bathing (including washing, rinsing, drying)?: A Lot Help from another person to put on and taking off regular upper body clothing?: A Lot Help from another person to put on and taking off regular lower body clothing?: A Lot 6 Click Score: 14    End of Session Equipment Utilized During Treatment: Gait belt;Rolling walker  OT Visit Diagnosis: Unsteadiness on feet (R26.81);Other abnormalities of gait and mobility (R26.89);Muscle weakness  (generalized) (M62.81);Pain   Activity Tolerance Patient limited by fatigue   Patient Left in chair;with call bell/phone within reach   Nurse Communication          Time: 6283-1517 OT Time Calculation (min): 25 min  Charges: OT General Charges $OT Visit: 1 Visit OT Treatments $Self Care/Home Management : 23-37 mins  06/12/2020  Rich, OTR/L  Acute Rehabilitation Services  Office:  [phone]    Metta Clines 06/12/2020, 11:18 AM

## 2020-06-12 NOTE — Progress Notes (Signed)
Orthopedic Tech Progress Note Patient Details:  Tage Feggins April 20, 1928 [id number]  Ortho Devices Type of Ortho Device: Haematologist Ortho Device/Splint Location: Bilateral Ortho Device/Splint Interventions: Application,Ordered   Post Interventions Patient Tolerated: Well Instructions Provided: Care of device,Poper ambulation with device   Lyla Jasek A Kenner Lewan 06/12/2020, 11:45 AM

## 2020-06-12 NOTE — Progress Notes (Signed)
20 Days Post-Op Procedure(s) (LRB): AORTIC VALVE REPLACEMENT (AVR) USING EDWARDS RESILIA 29 MM AORTIC VALVE. (N/A) CORONARY ARTERY BYPASS GRAFTING (CABG) USING [place]A to LAD; ENDOSCOPIC HARVESTED RIGHT GREATER SAPHENOUS VEIN: SVG to PD; SVG to SEQUENCED INTERMEDIATE to OM (N/A) TRANSESOPHAGEAL ECHOCARDIOGRAM (TEE) (N/A) ENDOVEIN HARVEST OF GREATER SAPHENOUS VEIN (Right) Subjective:  Only slept 1-2 hrs last night after Trazodone 50. He did ambulate this am. 350 ft. Reportedly strong but balance off.  Objective: Vital signs in last 24 hours: Temp:  [97.6 F (36.4 C)-99.9 F (37.7 C)] 98.2 F (36.8 C) (12/16 1114) Pulse Rate:  [61-82] 76 (12/16 1100) Cardiac Rhythm: Normal sinus rhythm (12/16 0345) Resp:  [16-51] 28 (12/16 1100) BP: (97-125)/(63-80) 125/79 (12/16 1100) SpO2:  [84 %-100 %] 96 % (12/16 1100) Weight:  [80.2 kg] 80.2 kg (12/16 0422)  Hemodynamic parameters for last 24 hours: CVP:  [0 mmHg-14 mmHg] 0 mmHg  Intake/Output from previous day: 12/15 0701 - 12/16 0700 In: 2732.6 [P.O.:1080; I.V.:38.7; NG/GT:1208.4; IV Piggyback:405.6] Out: 2350 [Urine:2350] Intake/Output this shift: Total I/O In: 372.3 [P.O.:360; I.V.:12.3] Out: -   General appearance: alert and cooperative Neurologic: intact Heart: regular rate and rhythm, S1, S2 normal, no murmur Lungs: diminished breath sounds bibasilar Abdomen: soft, non-tender; bowel sounds normal Extremities: edema mild Wound: incision ok  Lab Results: Recent Labs    06/11/20 0442 06/12/20 0437  WBC 8.4 8.2  HGB 8.5* 8.9*  HCT 28.2* 28.3*  PLT 147* 151   BMET:  Recent Labs    06/11/20 0442 06/12/20 0437  NA 140 138  K 4.5 4.5  CL 99 100  CO2 31 30  GLUCOSE 138* 121*  BUN 44* 39*  CREATININE 1.19 1.24  CALCIUM 8.4* 8.6*    PT/INR: No results for input(s): LABPROT, INR in the last 72 hours. ABG    Component Value Date/Time   PHART 7.381 05/26/2020 1750   HCO3 22.2 05/26/2020 1750   TCO2 23 05/26/2020 1750    ACIDBASEDEF 2.0 05/26/2020 1750   O2SAT 62.0 06/12/2020 0437   CBG (last 3)  Recent Labs    06/12/20 0318 06/12/20 0644 06/12/20 1112  GLUCAP 129* 142* 99    Assessment/Plan: S/P Procedure(s) (LRB): AORTIC VALVE REPLACEMENT (AVR) USING EDWARDS RESILIA 29 MM AORTIC VALVE. (N/A) CORONARY ARTERY BYPASS GRAFTING (CABG) USING [place] to LAD; ENDOSCOPIC HARVESTED RIGHT GREATER SAPHENOUS VEIN: SVG to PD; SVG to SEQUENCED INTERMEDIATE to OM (N/A) TRANSESOPHAGEAL ECHOCARDIOGRAM (TEE) (N/A) ENDOVEIN HARVEST OF GREATER SAPHENOUS VEIN (Right)  POD 20  Hemodynamically stable in sinus rhythm. Appears to have 1st degree block but at times looks Wenckebach. Remains on midodrine 10 tid and dobut 1.5. Co-ox is 62%.  Weights are all over the place but probably 10 lbs over baseline preop wt. He has mild peripheral edema. Continuing daily lasix.  Tolerating TF at goal for nutrition and eating some.   Day 7/7 of antibiotics for pneumonia. Could stop them after today. WBC nl.  Major goal at this time is mobilization, PT, IS.   Will transfer to [place] to continue present therapies.       LOS: 27 days    Gaye Pollack 06/12/2020

## 2020-06-12 NOTE — Progress Notes (Addendum)
Physical Therapy Treatment Patient Details Name: [name] [name] MRN: [id number] DOB: 1928-05-27 Today's Date: 06/12/2020    History of Present Illness 86 yo male presenting to PCP with worsening LE edema and dyspnea. S/p right/left heart cath and coronary angiography on 11/22. S/p CABG x4 on 11/26. PMH including venous insufficiency with chronic lower extremity edema (mostly on the left), hypertension, hyperlipidemia, peripheral neuropathy, Bil TKA,  essential tremor, and prostate cancer.    PT Comments    Pt admitted with above diagnosis. Pt was able to ambulate with RW on unit and incr distance today.  Needs several standing rest breaks and constant cues to stay close to RW. Pt currently with functional limitations due to balance and endurance. Pt will benefit from skilled PT to increase their independence and safety with mobility to allow discharge to the venue listed below.     Follow Up Recommendations  SNF     Equipment Recommendations  None recommended by PT (has RW at home)    Recommendations for Other Services       Precautions / Restrictions Precautions Precautions: Fall;Sternal Precaution Comments: Reviewed sternal precautions Restrictions Weight Bearing Restrictions: No Other Position/Activity Restrictions: sternal    Mobility  Bed Mobility               General bed mobility comments: in chair  Transfers Overall transfer level: Needs assistance Equipment used: Rolling walker (2 wheeled) Transfers: Sit to/from Stand Sit to Stand: Min assist         General transfer comment: power up assist but steadier once up.  Ambulation/Gait Ambulation/Gait assistance: Min assist Gait Distance (Feet): 295 Feet Assistive device: Rolling walker (2 wheeled) Gait Pattern/deviations: Step-through pattern;Trunk flexed;Decreased stride length;Leaning posteriorly;Wide base of support;Drifts right/left Gait velocity: slowed Gait velocity interpretation: <1.31 ft/sec,  indicative of household ambulator General Gait Details: Pt continues to require min  assistance for balance.  Pt with incr distance today but needed at least 6 standing rest breaks during walk. Pt needed constant cues to stay close to RW as well. Educated on posture and  hip extension as pt flexes at trunk. Did not need chair follow. Sats registering at >90% with activity on RA with dOE 3/4.   Stairs             Wheelchair Mobility    Modified Rankin (Stroke Patients Only)       Balance Overall balance assessment: Needs assistance         Standing balance support: Bilateral upper extremity supported Standing balance-Leahy Scale: Poor Standing balance comment: requires external assist and B UE to maintain standing balance                            Cognition Arousal/Alertness: Awake/alert Behavior During Therapy: WFL for tasks assessed/performed Overall Cognitive Status: Within Functional Limits for tasks assessed                                        Exercises General Exercises - Lower Extremity Ankle Circles/Pumps: AROM;Both;10 reps;Supine Long Arc Quad: Both;10 reps;Seated;AROM Hip Flexion/Marching: 10 reps;Seated;Both;AROM    General Comments General comments (skin integrity, edema, etc.): VSS on RA      Pertinent Vitals/Pain Pain Assessment: No/denies pain Faces Pain Scale: Hurts a little bit Pain Location: chest surgical site Pain Descriptors / Indicators: Jabbing Pain Intervention(s): Monitored during session  Home Living                      Prior Function            PT Goals (current goals can now be found in the care plan section) Acute Rehab PT Goals Patient Stated Goal: Get my body to do what my mind wants it to do. Progress towards PT goals: Progressing toward goals    Frequency    Min 2X/week      PT Plan Current plan remains appropriate    Co-evaluation              AM-PAC PT "6  Clicks" Mobility   Outcome Measure  Help needed turning from your back to your side while in a flat bed without using bedrails?: A Little Help needed moving from lying on your back to sitting on the side of a flat bed without using bedrails?: A Lot Help needed moving to and from a bed to a chair (including a wheelchair)?: A Little Help needed standing up from a chair using your arms (e.g., wheelchair or bedside chair)?: A Lot Help needed to walk in hospital room?: A Little Help needed climbing 3-5 steps with a railing? : A Lot 6 Click Score: 15    End of Session Equipment Utilized During Treatment: Gait belt Activity Tolerance: Patient limited by fatigue Patient left: in chair;with call bell/phone within reach Nurse Communication: Mobility status PT Visit Diagnosis: Muscle weakness (generalized) (M62.81);Difficulty in walking, not elsewhere classified (R26.2);Other abnormalities of gait and mobility (R26.89);Pain Pain - part of body:  (sternum)     Time: 0936-1000 PT Time Calculation (min) (ACUTE ONLY): 24 min  Charges:  $Gait Training: 8-22 mins $Therapeutic Exercise: 8-22 mins                     Ninetta Adelstein W,PT Acute Rehabilitation Services Pager:  [phone]  Office:  [place] 06/12/2020, 12:45 PM

## 2020-06-12 NOTE — Plan of Care (Signed)
  Problem: Fluid Volume: Goal: Compliance with measures to maintain balanced fluid volume will improve Outcome: Progressing   Problem: Clinical Measurements: Goal: Will remain free from infection Outcome: Progressing

## 2020-06-12 NOTE — Progress Notes (Signed)
CARDIAC REHAB PHASE I   PRE:  Rate/Rhythm: 73 1HB SR  BP:  Supine:   Sitting: 97/61, 125/77  Standing:    SaO2: 92%RA  MODE:  Ambulation: 270 ft   POST:  Rate/Rhythm: 89   BP:  Supine:   Sitting: 128/79  Standing:    SaO2: 89-95%RA 1400-1450 Pt c/o dizziness upon putting feet down. Retook BP at 125/77. Let pt rest and then assisted to [facility] at his request. No BM. Pt walked 270 ft on RA with gait belt use, rolling walker and asst x 2. Pt took about 4 standing rest breaks during walk. Encouraged pursed lip breathing. Fairly steady. Encouraged to stand upright. Pt assisted to bed at his request after walk. Needed verbal cues to get pt to sit down on bedside. Wife in room and pt comfortable upon leaving.   Graylon Good, RN BSN  06/12/2020 2:46 PM

## 2020-06-12 NOTE — Progress Notes (Signed)
CT surgery p.m. rounds  Resting comfortably Ate some of his full liquid meal, tube feeds at 55 cc Vital signs stable rate controlled A. Fib Blood pressure 103/69, pulse 74, temperature (!) 97.5 F (36.4 C), resp. rate (!) 22, height 5\' 9"  (1.753 m), weight 80.2 kg, SpO2 96 %.

## 2020-06-12 NOTE — Progress Notes (Addendum)
Patient ID: [name] [name], male   DOB: [birth year], 86 y.o.   MRN: [id number]     Advanced Heart Failure Rounding Note  PCP-Cardiologist: Peter Martinique, MD   Subjective:    Remains on dobutamine 1.5. Co-ox 75%. CVP 10-11  Did well ambulating yesterday. Walked w/ PT several times.    Objective:   Weight Range: 80.2 kg Body mass index is 26.11 kg/m.   Vital Signs:                 Temp:  [97.6 F (36.4 C)-99.9 F (37.7 C)] 98.5 F (36.9 C) (12/16 0729) Pulse Rate:  [61-82] 77 (12/16 0700) Resp:  [16-39] 31 (12/16 0700) BP: (97-125)/(63-80) 113/74 (12/16 0700) SpO2:  [84 %-100 %] 92 % (12/16 0700) Weight:  [80.2 kg] 80.2 kg (12/16 0422) Last BM Date: 06/11/20  Weight change:      Filed Weights   06/10/20 0645 06/11/20 0500 06/12/20 0422  Weight: 71.1 kg 71.1 kg 80.2 kg    Intake/Output:             Intake/Output Summary (Last 24 hours) at 06/12/2020 0908 Last data filed at 06/12/2020 0400    Gross per 24 hour  Intake 2622.63 ml  Output 2350 ml  Net 272.63 ml                 Physical Exam   CVP 10-11  General:  Fatigue appearing, elderly WM sitting up in chair. No respiratory difficulty HEENT: normal + cor trak  Neck: supple. JVD 10 cm. Carotids 2+ bilat; no bruits. No lymphadenopathy or thyromegaly appreciated. Cor: PMI nondisplaced. Regular rate & rhythm. No rubs, gallops or murmurs. Lungs: decreased BS at the bases bilaterally  Abdomen: soft, nontender, nondistended. No hepatosplenomegaly. No bruits or masses. Good bowel sounds. Extremities: no cyanosis, clubbing, rash, trace bilateral LEE edema Neuro: alert & oriented x 3, cranial nerves grossly intact. moves all 4 extremities w/o difficulty. Affect pleasant.   Telemetry   Sinus rhythm w/ 1st degree AVB, 70s   Labs    CBC Recent Labs (last 2 labs)       Recent Labs    06/11/20 0442 06/12/20 0437  WBC 8.4 8.2  HGB 8.5* 8.9*  HCT 28.2* 28.3*  MCV 101.8* 98.6  PLT 147* 151      Basic Metabolic Panel Recent Labs (last 2 labs)       Recent Labs    06/11/20 0442 06/12/20 0437  NA 140 138  K 4.5 4.5  CL 99 100  CO2 31 30  GLUCOSE 138* 121*  BUN 44* 39*  CREATININE 1.19 1.24  CALCIUM 8.4* 8.6*     Liver Function Tests Recent Labs (last 2 labs)   No results for input(s): AST, ALT, ALKPHOS, BILITOT, PROT, ALBUMIN in the last 72 hours.   Recent Labs (last 2 labs)   No results for input(s): LIPASE, AMYLASE in the last 72 hours.   Cardiac Enzymes Recent Labs (last 2 labs)   No results for input(s): CKTOTAL, CKMB, CKMBINDEX, TROPONINI in the last 72 hours.    BNP: BNP (last 3 results) Recent Labs (within last 365 days)      Recent Labs    11/19/19 1550 05/16/20 1736  BNP 1,139.7* 2,005.2*      ProBNP (last 3 results) Recent Labs (within last 365 days)     Recent Labs    01/02/20 1134  PROBNP 1,167.0*       D-Dimer Recent Labs (last 2 labs)  No results for input(s): DDIMER in the last 72 hours.   Hemoglobin A1C Recent Labs (last 2 labs)   No results for input(s): HGBA1C in the last 72 hours.   Fasting Lipid Panel Recent Labs (last 2 labs)   No results for input(s): CHOL, HDL, LDLCALC, TRIG, CHOLHDL, LDLDIRECT in the last 72 hours.   Thyroid Function Tests  Recent Labs (last 2 labs)   No results for input(s): TSH, T4TOTAL, T3FREE, THYROIDAB in the last 72 hours.  Invalid input(s): FREET3    Other results:   Imaging     No results found.   Medications:     Scheduled Medications:  .  aspirin EC   81 mg  Oral  Daily   .  Chlorhexidine Gluconate Cloth   6 each  Topical  Daily   .  docusate sodium   200 mg  Oral  Daily   .  dronabinol   5 mg  Oral  BID AC   .  escitalopram   5 mg  Oral  Daily   .  feeding supplement   237 mL  Oral  TID BM   .  feeding supplement (OSMOLITE 1.5 CAL)   1,000 mL  Per Tube  Q24H   .  feeding supplement (PROSource TF)   45 mL   Per Tube  BID   .  ferrous [id number] C-folic acid   1 capsule  Oral  BID PC   .  furosemide   40 mg  Intravenous  Daily   .  insulin aspart   0-15 Units  Subcutaneous  Q4H   .  mouth rinse   15 mL  Mouth Rinse  BID   .  midodrine   10 mg  Oral  TID   .  multivitamin with minerals   1 tablet  Oral  Daily   .  pantoprazole   40 mg  Oral  Daily   .  tamsulosin   0.4 mg  Oral  Daily   .  traZODone   50 mg  Oral  QHS     Infusions:  .  sodium chloride  Stopped (06/08/20 0941)   .  ceFEPime (MAXIPIME) IV  Stopped (06/11/20 2152)   .  DOBUTamine  1.5 mcg/kg/min (06/12/20 0400)   .  vancomycin  1,000 mg (06/11/20 1000)     PRN Medications:  sodium chloride, bisacodyl, Gerhardt's butt cream, melatonin, ondansetron (ZOFRAN) IV, sodium chloride flush, traMADol   Assessment/Plan   1. Severe 3V CAD: - s/p CABGx 4(LIMA-LAD, sequential SVG-OM1 and OM2, SVG- PDA) - No chest pain.  - continue ASA 81 - no?blockerw/ post-cardiotomy shock and low BP - Atorva started 12/7 and stopped 12/8. Doubt this is contributing to his weakness but ok to hold   2. Aortic Stenosis: - severe low flow low gradient AS - s/p tissue AVR 11/26  - AV prosthesis ok on post op echo - stable  3. Acute on Chronic Biventricular Heart Failure:  -Echo in 2014 showed normal LVEF 55-60% w/ G1DD. -Echo 10/21 and EF 30-35% w/ global HK. RV systolic function mildly reduced. - LHC this admit w/ severe 3V CAD>>ICM. Now s/p CABG. Also w/ severe AS, possibly contributing to LV dysfunction, now s/p SAVR.  - post op recovery c/b difficulties weaning pressors/inotropes.  - Post-op echo 12/1 EF 30-35%, RV mildly reduced. AV prothesis ok - off NE - Remains on dobutamine 1.5 mcg. Co-ox 62%   - CVP 10-11. Continue IV lasix  40 mg bid.   - On midodrine for BP support - Off digoxin with ? heart block.  - Place ted hose today.   - hypotension  limits use of ARNi/ARB and spiro   4. Post Operative Atrial Fibrillation:  - Currently NSR  - Monitor on tele while on DBA - defer anticoagulation to CT surgery   5. AKI:  - Baseline <1.0.  -Creatinine 1.2 today. Stable.   6. Hyponatremia - resolved  7. Anemia - expected ABLA from surgery  -  on PO Fe - Hgb stable 8.9   8. Debility - Major issue currently. PT/OT following.  - Recommending SNF.   9. Urinary Retention - Prior to admit I&O cath at home.  - Continues with I&O cath every 6 hours.   10. PNA  - CXR w/ bilateral infiltrates, PCT 0.26  - on Vanc + cefepime (continue x 7 days)  - AF WBC down to 8.4   11. Rhythm - looks like sinus with long 1AVB - Digoxin discontinued.   12. Constipation  - Resolved.  - continue to mobilize  13. Insomnia - continue trazodone     Lyda Jester, PA-C  06/12/2020 9:08 AM   Patient seen and examined with the above-signed Advanced Practice Provider and/or Housestaff. I personally reviewed laboratory data, imaging studies and relevant notes. I independently examined the patient and formulated the important aspects of the plan. I have edited the note to reflect any of my changes or salient points. I have personally discussed the plan with the patient and/or family.  Remains on dobutamine. CVP 9-10 on IV lasix. Still on IV abx. Afebrile. Did not sleep very well last night despite trazodone 50. Still getting cor-trak feeds. Able to walk with PT.   General:  Elderly  No resp difficulty HEENT: normal + Cor-trak Neck: supple. JVP 9. Carotids 2+ bilat; no bruits. No lymphadenopathy or thryomegaly appreciated. Cor: PMI nondisplaced. Regular rate & rhythm. No rubs, gallops or murmurs. Lungs: clear Abdomen: soft, nontender, nondistended. No hepatosplenomegaly. No bruits or masses. Good bowel sounds. Extremities: no cyanosis, clubbing, rash, tr edema Neuro: alert & orientedx3, cranial nerves grossly intact.  moves all 4 extremities w/o difficulty. Affect pleasant  Making slow progress. Insomnia still an issue. Will continue dobutamine and IV lasix. Increase trazodone to 100 qhs. Can go to [facility]. D/w Dr. Cyndia Bent and PT team at bedside.   Glori Bickers, MD  11:34 AM

## 2020-06-13 DIAGNOSIS — Z952 Presence of prosthetic heart valve: Secondary | ICD-10-CM | POA: Diagnosis not present

## 2020-06-13 DIAGNOSIS — F5102 Adjustment insomnia: Secondary | ICD-10-CM

## 2020-06-13 DIAGNOSIS — Z515 Encounter for palliative care: Secondary | ICD-10-CM

## 2020-06-13 LAB — CBC
HCT: 27.4 % — ABNORMAL LOW (ref 39.0–52.0)
Hemoglobin: 8.9 g/dL — ABNORMAL LOW (ref 13.0–17.0)
MCH: 31.7 pg (ref 26.0–34.0)
MCHC: 32.5 g/dL (ref 30.0–36.0)
MCV: 97.5 fL (ref 80.0–100.0)
Platelets: 155 10*3/uL (ref 150–400)
RBC: 2.81 MIL/uL — ABNORMAL LOW (ref 4.22–5.81)
RDW: 15.8 % — ABNORMAL HIGH (ref 11.5–15.5)
WBC: 6.9 10*3/uL (ref 4.0–10.5)
nRBC: 0 % (ref 0.0–0.2)

## 2020-06-13 LAB — GLUCOSE, CAPILLARY
Glucose-Capillary: 103 mg/dL — ABNORMAL HIGH (ref 70–99)
Glucose-Capillary: 116 mg/dL — ABNORMAL HIGH (ref 70–99)
Glucose-Capillary: 130 mg/dL — ABNORMAL HIGH (ref 70–99)
Glucose-Capillary: 132 mg/dL — ABNORMAL HIGH (ref 70–99)
Glucose-Capillary: 154 mg/dL — ABNORMAL HIGH (ref 70–99)
Glucose-Capillary: 164 mg/dL — ABNORMAL HIGH (ref 70–99)
Glucose-Capillary: 166 mg/dL — ABNORMAL HIGH (ref 70–99)

## 2020-06-13 LAB — BASIC METABOLIC PANEL
Anion gap: 9 (ref 5–15)
BUN: 40 mg/dL — ABNORMAL HIGH (ref 8–23)
CO2: 29 mmol/L (ref 22–32)
Calcium: 8.6 mg/dL — ABNORMAL LOW (ref 8.9–10.3)
Chloride: 101 mmol/L (ref 98–111)
Creatinine, Ser: 1.37 mg/dL — ABNORMAL HIGH (ref 0.61–1.24)
GFR, Estimated: 49 mL/min — ABNORMAL LOW (ref >60)
Glucose, Bld: 149 mg/dL — ABNORMAL HIGH (ref 70–99)
Potassium: 4.8 mmol/L (ref 3.5–5.1)
Sodium: 139 mmol/L (ref 135–145)

## 2020-06-13 LAB — COOXEMETRY PANEL
Carboxyhemoglobin: 1.8 % — ABNORMAL HIGH (ref 0.5–1.5)
Methemoglobin: 0.8 % (ref 0.0–1.5)
O2 Saturation: 74.9 %
Total hemoglobin: 9.2 g/dL — ABNORMAL LOW (ref 12.0–16.0)

## 2020-06-13 LAB — CORTISOL-AM, BLOOD: Cortisol - AM: 14.4 ug/dL (ref 6.7–22.6)

## 2020-06-13 LAB — TSH: TSH: 1.92 u[IU]/mL (ref 0.350–4.500)

## 2020-06-13 LAB — T4, FREE: Free T4: 1.46 ng/dL — ABNORMAL HIGH (ref 0.61–1.12)

## 2020-06-13 MED ORDER — SENNA 8.6 MG PO TABS
1.0000 | ORAL_TABLET | Freq: Two times a day (BID) | ORAL | Status: DC
  Administered 2020-06-13 – 2020-06-14 (×2): 8.6 mg via ORAL
  Filled 2020-06-13 (×2): qty 1

## 2020-06-13 MED ORDER — MIRTAZAPINE 15 MG PO TABS
7.5000 mg | ORAL_TABLET | Freq: Every day | ORAL | Status: DC
  Administered 2020-06-13 – 2020-06-25 (×13): 7.5 mg via ORAL
  Filled 2020-06-13 (×13): qty 1

## 2020-06-13 MED ORDER — ALPRAZOLAM 0.25 MG PO TABS
0.2500 mg | ORAL_TABLET | Freq: Every day | ORAL | Status: AC
  Administered 2020-06-13: 0.25 mg via ORAL
  Filled 2020-06-13: qty 1

## 2020-06-13 MED ORDER — MELATONIN 5 MG PO TABS
5.0000 mg | ORAL_TABLET | Freq: Every evening | ORAL | Status: DC | PRN
  Administered 2020-06-13 – 2020-06-25 (×9): 5 mg via ORAL
  Filled 2020-06-13 (×9): qty 1

## 2020-06-13 MED ORDER — TRAZODONE HCL 50 MG PO TABS: 25.0000 mg | ORAL_TABLET | Freq: Every day | ORAL | Status: DC

## 2020-06-13 NOTE — Progress Notes (Addendum)
Patient ID: [name] [name], male   DOB: [birth year], 86 y.o.   MRN: [id number]     Advanced Heart Failure Rounding Note  PCP-Cardiologist: Peter Martinique, MD   Subjective:    Remains on dobutamine 1.5. Co-ox 75%. CVP 7-8  Did well ambulating yesterday. Walked w/ PT several times.   Feels "lousy" today. Continues w/ poor sleep despite trazodone. Appetite still poor. Has Cor-trak and getting nocturnal tube feeds.   SCr trending up, 1.24>>1.37 today.    Objective:   Weight Range: 79.9 kg Body mass index is 26.01 kg/m.   Vital Signs:   Temp:  [97.5 F (36.4 C)-99.3 F (37.4 C)] 98.4 F (36.9 C) (12/17 0728) Pulse Rate:  [62-88] 68 (12/17 0800) Resp:  [19-32] 30 (12/17 0800) BP: (90-131)/(55-82) 97/55 (12/17 0800) SpO2:  [89 %-98 %] 93 % (12/17 0800) Weight:  [79.9 kg] 79.9 kg (12/17 0600) Last BM Date: 06/11/20  Weight change: Filed Weights   06/11/20 0500 06/12/20 0422 06/13/20 0600  Weight: 71.1 kg 80.2 kg 79.9 kg    Intake/Output:   Intake/Output Summary (Last 24 hours) at 06/13/2020 0945 Last data filed at 06/13/2020 0800 Gross per 24 hour  Intake 1954.14 ml  Output 2925 ml  Net -970.86 ml      Physical Exam   CVP 7-8 General:  Fatigue appearing, sitting up in chair. No respiratory difficulty HEENT: normal + cor trak  Neck: supple. JVD 7 cm. Carotids 2+ bilat; no bruits. No lymphadenopathy or thyromegaly appreciated. Cor: PMI nondisplaced. Regular rate & rhythm. No rubs, gallops or murmurs. Lungs: no wheezing, decreased BS at the bases bilaterally  Abdomen: soft, nontender, nondistended. No hepatosplenomegaly. No bruits or masses. Good bowel sounds. Extremities: no cyanosis, clubbing, rash, trace bilateral LEE edema + bilateral unna boots  Neuro: alert & oriented x 3, cranial nerves grossly intact. moves all 4 extremities w/o difficulty. Affect pleasant.   Telemetry   Sinus rhythm w/ 1st degree AVB, 70s   Labs    CBC Recent Labs    06/12/20 0437  06/13/20 0439  WBC 8.2 6.9  HGB 8.9* 8.9*  HCT 28.3* 27.4*  MCV 98.6 97.5  PLT 151 350   Basic Metabolic Panel Recent Labs    06/12/20 0437 06/13/20 0439  NA 138 139  K 4.5 4.8  CL 100 101  CO2 30 29  GLUCOSE 121* 149*  BUN 39* 40*  CREATININE 1.24 1.37*  CALCIUM 8.6* 8.6*   Liver Function Tests No results for input(s): AST, ALT, ALKPHOS, BILITOT, PROT, ALBUMIN in the last 72 hours. No results for input(s): LIPASE, AMYLASE in the last 72 hours. Cardiac Enzymes No results for input(s): CKTOTAL, CKMB, CKMBINDEX, TROPONINI in the last 72 hours.  BNP: BNP (last 3 results) Recent Labs    11/19/19 1550 05/16/20 1736  BNP 1,139.7* 2,005.2*    ProBNP (last 3 results) Recent Labs    01/02/20 1134  PROBNP 1,167.0*     D-Dimer No results for input(s): DDIMER in the last 72 hours. Hemoglobin A1C No results for input(s): HGBA1C in the last 72 hours. Fasting Lipid Panel No results for input(s): CHOL, HDL, LDLCALC, TRIG, CHOLHDL, LDLDIRECT in the last 72 hours. Thyroid Function Tests No results for input(s): TSH, T4TOTAL, T3FREE, THYROIDAB in the last 72 hours.  Invalid input(s): FREET3  Other results:   Imaging    No results found.   Medications:     Scheduled Medications: . aspirin EC  81 mg Oral Daily  . Chlorhexidine Gluconate Cloth  6 each Topical Daily  . docusate sodium  200 mg Oral Daily  . dronabinol  5 mg Oral BID AC  . escitalopram  5 mg Oral Daily  . feeding supplement  237 mL Oral TID BM  . feeding supplement (OSMOLITE 1.5 CAL)  1,000 mL Per Tube Q24H  . feeding supplement (PROSource TF)  45 mL Per Tube BID  . ferrous [id number] C-folic acid  1 capsule Oral BID PC  . insulin aspart  0-15 Units Subcutaneous Q4H  . mouth rinse  15 mL Mouth Rinse BID  . midodrine  10 mg Oral TID  . multivitamin with minerals  1 tablet Oral Daily  . pantoprazole  40 mg Oral Daily  . tamsulosin  0.4 mg Oral Daily  . traZODone  100 mg Oral QHS     Infusions: . sodium chloride Stopped (06/08/20 0941)  . DOBUTamine 1.5 mcg/kg/min (06/13/20 0800)    PRN Medications: sodium chloride, bisacodyl, Gerhardt's butt cream, melatonin, ondansetron (ZOFRAN) IV, sodium chloride flush, traMADol   Assessment/Plan   1. Severe 3V CAD: - s/p CABGx 4 (LIMA-LAD, sequential SVG-OM1 and OM2, SVG- PDA) - No chest pain.  - continue ASA 81 - no ? blocker w/ post-cardiotomy shock and low BP - Atorva started 12/7 and stopped 12/8. Doubt this is contributing to his weakness but ok to hold   2. Aortic Stenosis: - severe low flow low gradient AS - s/p tissue AVR 11/26  - AV prosthesis ok on post op echo - stable  3. Acute on Chronic Biventricular Heart Failure:  - Echo in 2014 showed normal LVEF 55-60% w/ G1DD.  - Echo 10/21 and EF 30-35% w/ global HK. RV systolic function mildly reduced. - LHC this admit w/ severe 3V CAD>>ICM. Now s/p CABG. Also w/ severe AS, possibly contributing to LV dysfunction, now s/p SAVR.  - post op recovery c/b difficulties weaning pressors/inotropes.  - Post-op echo 12/1 EF 30-35%, RV mildly reduced. AV prothesis ok - off NE - Remains on dobutamine 1.5 mcg. Co-ox 75%   - suspect he is a little dry. CVP 7. SCr up. Hold lasix today  - On midodrine for BP support - Off digoxin with ? heart block.  - Continue LE compression  - hypotension limits use of ARNi/ARB and spiro   4. Post Operative Atrial Fibrillation:   - Currently NSR  - Monitor on tele while on DBA - defer anticoagulation to CT surgery    5. AKI:   - Baseline <1.0.  -Creatinine 1.4 today.  - Hold diuretics today and follow BMP   6. Hyponatremia - resolved  7. Anemia - expected ABLA from surgery  -  on PO Fe  - Hgb stable 8.9   8. Debility - Major issue currently. PT/OT following.  - Recommending SNF.   9. Urinary Retention - Prior to admit I&O cath at home.  - Continues with I&O cath every 6 hours.   10. PNA  - CXR w/ bilateral  infiltrates, PCT 0.26  - completed Vanc + cefepime x 7 days - AF WBC down to 6.9   11. Rhythm - looks like sinus with long 1AVB - Digoxin discontinued.   12. Constipation  - Resolved.  - continue to mobilize  13. Insomnia - continue trazodone     Lyda Jester, PA-C  06/13/2020 9:45 AM   Patient seen and examined with the above-signed Advanced Practice Provider and/or Housestaff. I personally reviewed laboratory data, imaging studies and relevant notes. I independently  examined the patient and formulated the important aspects of the plan. I have edited the note to reflect any of my changes or salient points. I have personally discussed the plan with the patient and/or family.  Remains on dobutamine. Co-ox ok. Continues to make slow progress with his walking but remains depressed and frustrated. Volume status ok. PNA has been treated. Continues with TFs via Cor-trak  General:  Elderly male No resp difficulty HEENT: normal + Cor-trak Neck: supple. no JVD. Carotids 2+ bilat; no bruits. No lymphadenopathy or thryomegaly appreciated. Cor: PMI nondisplaced. Regular rate & rhythm. No rubs, gallops or murmurs. Lungs: clear Abdomen: soft, nontender, nondistended. No hepatosplenomegaly. No bruits or masses. Good bowel sounds. Extremities: no cyanosis, clubbing, rash, edema Neuro: alert & orientedx3, cranial nerves grossly intact. moves all 4 extremities w/o difficulty. Affect pleasant  Continue dobutamine for now. Continue to mobilize. Continue TFs. Insomnia improved with trazodone.   Motivation and depression seem to be major issues. Will ask Palliative Care team to see and see if they have recommendations for his medicine regimen.   Needs to go to [facility].   Glori Bickers, MD  10:02 AM

## 2020-06-13 NOTE — Consult Note (Signed)
Consultation Note Date: 06/13/2020   Patient Name: [name] [name]  DOB: 1927/10/19  MRN: [id number]  Age / Sex: 86 y.o., male  PCP: [name], MD Referring Physician: Gaye Pollack, MD  Reason for Consultation: Symptom management:  appetite, insomnia, motivation, depression  HPI/Patient Profile: 86 y.o. male  with past medical history of prostate cancer with obstructive uropathy, peripheral neuropathy, colitis, CAD, AS, CHF who was admitted on 05/16/2020 with acute on chronic heart failure exacerbation.  He was found to have severe 3V CAD and aortic stenosis.  He underwent CABG x 4 and AVR on 05/23/2020.  Post op echocardiogram 12/1 shows LVEF of 30 - 35 % and moderate to severe mitral valve regurgitation with an avg HR of 124.  PMT was asked for recommendations regarding symptoms of poor appetite, depression, and insomnia.  Clinical Assessment and Goals of Care:  I have reviewed medical records including EPIC notes, labs and imaging, received report from the care team, examined the patient and talked with him about symptoms and symptom management.  On exam Mr. Line had some episodes of dyspnea when speaking with me.  He would seem to take a minute, calm himself and they would improve.  He also had some difficulty with memory.  For example he was unable to give me details about the type of work he did for [employer].  At times he had difficulty hearing me.  I introduced Palliative Medicine as specialized medical care for people living with serious illness. It focuses on providing relief from the symptoms and stress of a serious illness.   We discussed a brief life review of the patient. He lives at home with his wife.  He has a son and a daughter each of whom have given him two grandsons.  Unfortunately his daughter lives in [place] and his son is not local either.  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  As far  as functional and nutritional status he is able to get up with physical therapy, but seems to need a lot of mobility assistance when in bed.    He tells me he can't position himself comfortably in bed at night.  He complains of insomnia.  He does not have this problem at home, but here in the hospital his eyes will not close and he can not get comfortable.  Further he expresses feelings of worthlessness. "I'm no good to anybody. I'm worthless in this state."  He does express a desire to go to rehab to regain strength.  He wants to be at home with his wife but feels she could not manage him in this state.  He is surprised at how difficult the recovery from surgery has been.  He did not expect it to be so difficult.  He complains of slow moving bowels and lack of desire to eat.  Mr. Golla denies having insomnia, feelings of depression, or even constipation at home and tells me he has never taken any medications in the past for these things.  I provided  space and time for Mr. Casique to express his concerns and fears. Reassurance was given.  I provided him my contact information and encouraged him to call with any questions or concerns.    Primary Decision Maker:  PATIENT    SUMMARY OF RECOMMENDATIONS     Discussed case with Dr. Rhea Pink.  As part of a depression work up will check TSH, Free T3, Free T4, and am Cortisol.  Will initiate low dose Remeron for insomnia, appetite and depression - DC lexapro and trazodone.  For immediate insomnia will try short term low dose Xanax.  I believe Mr. Willmann has a significant component of anxiety.  If he does well with Xanax, I would recommend it daily PRN as well as QHS.  He has no visible thrush but a very dry mouth. Add biotene rinse.  For constipation will add senna bid.  Hold for loose stools.  Recommend Palliative Care to follow at SNF for support and symptom management    Palliative Prophylaxis:   Delirium Protocol for quiet  nights  Psycho-social/Spiritual:   Desire for further Chaplaincy support: not discussed.  Discharge Planning: [facility] for rehab with Palliative care service follow-up      Primary Diagnoses: Present on Admission: . Acute on chronic combined systolic and diastolic CHF (congestive heart failure) ([place])   I have reviewed the medical record, interviewed the patient and family, and examined the patient. The following aspects are pertinent.  Past Medical History:  Diagnosis Date  . Aortic stenosis   . Arthritis   . CAD (coronary artery disease)   . Colitis   . Edema   . Essential hypertension    no med now  . Essential tremor   . Hyperlipidemia   . Hypotonic bladder    L4-L5  . Obstructive uropathy   . Peripheral neuropathy    Elevated by Dr. love 2010  . Prostate cancer [facility]) 2005   Social History   Socioeconomic History  . Marital status: Married    Spouse name: Not on file  . Number of children: Not on file  . Years of education: Not on file  . Highest education level: Not on file  Occupational History  . Not on file  Tobacco Use  . Smoking status: Never Smoker  . Smokeless tobacco: Never Used  Vaping Use  . Vaping Use: Never used  Substance and Sexual Activity  . Alcohol use: Yes    Alcohol/week: 1.0 standard drink    Types: 1 Glasses of wine per week    Comment: occ  . Drug use: No  . Sexual activity: Yes  Other Topics Concern  . Not on file  Social History Narrative  . Not on file   Social Determinants of Health   Financial Resource Strain: Not on file  Food Insecurity: Not on file  Transportation Needs: Not on file  Physical Activity: Not on file  Stress: Not on file  Social Connections: Not on file   Family History  Problem Relation Age of Onset  . Hypertension Mother   . Heart failure Mother   . Diabetes Mother   . Heart attack Brother   . Heart attack Maternal Aunt     Allergies  Allergen Reactions  . Simvastatin  Other (See Comments)    "caused neuropathy pain"      Vital Signs: BP 126/78 (BP Location: Left Arm)   Pulse 77   Temp 97.8 F (36.6 C) (Oral)   Resp (!) 26  Ht 5\' 9"  (1.753 m)   Wt 79.9 kg   SpO2 97%   BMI 26.01 kg/m  Pain Scale: 0-10 POSS *See Group Information*: 1-Acceptable,Awake and alert Pain Score: 0-No pain   SpO2: SpO2: 97 % O2 Device:SpO2: 97 % O2 Flow Rate: .O2 Flow Rate (L/min): 2 L/min    Palliative Assessment/Data: 50%     Time In: 4:30 Time Out: 5:30 Time Total: 60 min Visit consisted of counseling and education dealing with the complex and emotionally intense issues surrounding the need for palliative care and symptom management in the setting of serious and potentially life-threatening illness. Greater than 50%  of this time was spent counseling and coordinating care related to the above assessment and plan.  Signed by: Florentina Jenny, PA-C Palliative Medicine  Please contact Palliative Medicine Team phone at [phone] for questions and concerns.  For individual provider: See Shea Evans

## 2020-06-13 NOTE — Progress Notes (Signed)
21 Days Post-Op Procedure(s) (LRB): AORTIC VALVE REPLACEMENT (AVR) USING EDWARDS RESILIA 29 MM AORTIC VALVE. (N/A) CORONARY ARTERY BYPASS GRAFTING (CABG) USING LIMA to LAD; ENDOSCOPIC HARVESTED RIGHT GREATER SAPHENOUS VEIN: SVG to PD; SVG to SEQUENCED INTERMEDIATE to OM (N/A) TRANSESOPHAGEAL ECHOCARDIOGRAM (TEE) (N/A) ENDOVEIN HARVEST OF GREATER SAPHENOUS VEIN (Right) Subjective: Says he can't relax. Mouth is dry. Reportedly slept better with increased Trazodone dose. 4-5 hrs. Continues to ambulate well per nurse. Needs to improve on balance. Eating 25% of meals but drinks 3 Ensures per day and nighttime tube feeds.  Objective: Vital signs in last 24 hours: Temp:  [97.5 F (36.4 C)-99.3 F (37.4 C)] 98.4 F (36.9 C) (12/17 0728) Pulse Rate:  [62-88] 68 (12/17 0800) Cardiac Rhythm: Normal sinus rhythm (12/17 0800) Resp:  [19-32] 30 (12/17 0800) BP: (90-131)/(55-82) 97/55 (12/17 0800) SpO2:  [89 %-98 %] 93 % (12/17 0800) Weight:  [79.9 kg] 79.9 kg (12/17 0600)  Hemodynamic parameters for last 24 hours: CVP:  [5 mmHg-10 mmHg] 5 mmHg  Intake/Output from previous day: 12/16 0701 - 12/17 0700 In: 1950.6 [P.O.:960; I.V.:45.7; NG/GT:745.1; IV Piggyback:199.9] Out: 2925 [Urine:2925] Intake/Output this shift: Total I/O In: 3.5 [I.V.:3.5] Out: -   General appearance: alert and cooperative Neurologic: intact Heart: regular rate and rhythm, S1, S2 normal, no murmur Lungs: clear to auscultation bilaterally Abdomen: soft, non-tender; bowel sounds normal Extremities: minimal edema Wound: incisions healing well.  Lab Results: Recent Labs    06/12/20 0437 06/13/20 0439  WBC 8.2 6.9  HGB 8.9* 8.9*  HCT 28.3* 27.4*  PLT 151 155   BMET:  Recent Labs    06/12/20 0437 06/13/20 0439  NA 138 139  K 4.5 4.8  CL 100 101  CO2 30 29  GLUCOSE 121* 149*  BUN 39* 40*  CREATININE 1.24 1.37*  CALCIUM 8.6* 8.6*    PT/INR: No results for input(s): LABPROT, INR in the last 72  hours. ABG    Component Value Date/Time   PHART 7.381 05/26/2020 1750   HCO3 22.2 05/26/2020 1750   TCO2 23 05/26/2020 1750   ACIDBASEDEF 2.0 05/26/2020 1750   O2SAT 74.9 06/13/2020 0439   CBG (last 3)  Recent Labs    06/13/20 0004 06/13/20 0440 06/13/20 0726  GLUCAP 164* 154* 132*    Assessment/Plan: S/P Procedure(s) (LRB): AORTIC VALVE REPLACEMENT (AVR) USING EDWARDS RESILIA 29 MM AORTIC VALVE. (N/A) CORONARY ARTERY BYPASS GRAFTING (CABG) USING LIMA to LAD; ENDOSCOPIC HARVESTED RIGHT GREATER SAPHENOUS VEIN: SVG to PD; SVG to SEQUENCED INTERMEDIATE to OM (N/A) TRANSESOPHAGEAL ECHOCARDIOGRAM (TEE) (N/A) ENDOVEIN HARVEST OF GREATER SAPHENOUS VEIN (Right)  POD 21 Hemodynamically stable on dobut 1.5 with Co-ox of 75 this am if accurate.  Volume excess: wt is about 10 lbs over preop baseline. Continues on daily diuretic. Creat trending up over the past 3 days. CVP 5 this am. May be getting intravascularly dry despite wt being up 10 lbs over preop.  Pneumonia: he has completed 7 days of antibiotics so I think we can stop these.  Continue to ambulate, work on IS.  Encourage nutrition.    LOS: 28 days    Gaye Pollack 06/13/2020

## 2020-06-13 NOTE — Progress Notes (Addendum)
CSW called [facility] admissions to confirm offer. [place] confirmed they will start auth today.

## 2020-06-14 DIAGNOSIS — Z515 Encounter for palliative care: Secondary | ICD-10-CM | POA: Diagnosis not present

## 2020-06-14 DIAGNOSIS — G4700 Insomnia, unspecified: Secondary | ICD-10-CM | POA: Diagnosis not present

## 2020-06-14 LAB — COOXEMETRY PANEL
Carboxyhemoglobin: 1.9 % — ABNORMAL HIGH (ref 0.5–1.5)
Methemoglobin: 0.9 % (ref 0.0–1.5)
O2 Saturation: 75.8 %
Total hemoglobin: 8.9 g/dL — ABNORMAL LOW (ref 12.0–16.0)

## 2020-06-14 LAB — GLUCOSE, CAPILLARY
Glucose-Capillary: 148 mg/dL — ABNORMAL HIGH (ref 70–99)
Glucose-Capillary: 143 mg/dL — ABNORMAL HIGH (ref 70–99)
Glucose-Capillary: 176 mg/dL — ABNORMAL HIGH (ref 70–99)
Glucose-Capillary: 78 mg/dL (ref 70–99)
Glucose-Capillary: 126 mg/dL — ABNORMAL HIGH (ref 70–99)

## 2020-06-14 LAB — BASIC METABOLIC PANEL
Anion gap: 9 (ref 5–15)
BUN: 38 mg/dL — ABNORMAL HIGH (ref 8–23)
CO2: 27 mmol/L (ref 22–32)
Calcium: 8.5 mg/dL — ABNORMAL LOW (ref 8.9–10.3)
Chloride: 103 mmol/L (ref 98–111)
Creatinine, Ser: 1.23 mg/dL (ref 0.61–1.24)
GFR, Estimated: 56 mL/min — ABNORMAL LOW (ref >60)
Glucose, Bld: 151 mg/dL — ABNORMAL HIGH (ref 70–99)
Potassium: 4.5 mmol/L (ref 3.5–5.1)
Sodium: 139 mmol/L (ref 135–145)

## 2020-06-14 MED ORDER — SENNA 8.6 MG PO TABS
1.0000 | ORAL_TABLET | Freq: Every day | ORAL | Status: DC
  Administered 2020-06-15 – 2020-06-26 (×12): 8.6 mg via ORAL
  Filled 2020-06-14 (×12): qty 1

## 2020-06-14 MED ORDER — BISACODYL 5 MG PO TBEC
10.0000 mg | DELAYED_RELEASE_TABLET | Freq: Once | ORAL | Status: AC
  Administered 2020-06-14: 10 mg via ORAL
  Filled 2020-06-14: qty 2

## 2020-06-14 MED ORDER — ALPRAZOLAM 0.25 MG PO TABS
0.2500 mg | ORAL_TABLET | Freq: Every day | ORAL | Status: DC
  Administered 2020-06-14 – 2020-06-25 (×12): 0.25 mg via ORAL
  Filled 2020-06-14 (×12): qty 1

## 2020-06-14 MED ORDER — BIOTENE DRY MOUTH MT LIQD
15.0000 mL | Freq: Three times a day (TID) | OROMUCOSAL | Status: DC
  Administered 2020-06-14 – 2020-06-21 (×18): 15 mL via OROMUCOSAL

## 2020-06-14 MED ORDER — ACETAMINOPHEN 325 MG PO TABS
650.0000 mg | ORAL_TABLET | Freq: Three times a day (TID) | ORAL | Status: DC
  Administered 2020-06-14 – 2020-06-26 (×37): 650 mg via ORAL
  Filled 2020-06-14 (×37): qty 2

## 2020-06-14 NOTE — Plan of Care (Signed)
Discussed with patient plan of care for the evening, pain management and CVP monitoring with some teach back displayed.  Talked with patient planning to ambulating before bedtime to promote sleep.  Patient supposedly had a run of V-fib/VT but some leads were normal while others looked like artifact.  Patient ambulated about 120 ft with an SpO2 of 86-87% on room air.  Re-educated in using heart pillow when coughing and moving in bed for support especially when ambulating.  Problem: Education: Goal: Knowledge of disease and its progression will improve Outcome: Progressing

## 2020-06-14 NOTE — Progress Notes (Signed)
Patient ID: [name] [name], male   DOB: 09-27-1927, 86 y.o.   MRN: [id number] TCTS DAILY ICU PROGRESS NOTE                   [address]            [place] [address]          [phone]   22 Days Post-Op Procedure(s) (LRB): AORTIC VALVE REPLACEMENT (AVR) USING EDWARDS RESILIA 29 MM AORTIC VALVE. (N/A) CORONARY ARTERY BYPASS GRAFTING (CABG) USING LIMA to LAD; ENDOSCOPIC HARVESTED RIGHT GREATER SAPHENOUS VEIN: SVG to PD; SVG to SEQUENCED INTERMEDIATE to OM (N/A) TRANSESOPHAGEAL ECHOCARDIOGRAM (TEE) (N/A) ENDOVEIN HARVEST OF GREATER SAPHENOUS VEIN (Right)  Total Length of Stay:  LOS: 29 days   Subjective: Patient awake and talkative, complains about how weak he is, currently on tube feedings but is taking more p.o. supplements  Objective: Vital signs in last 24 hours: Temp:  [97.8 F (36.6 C)-98.9 F (37.2 C)] 97.8 F (36.6 C) (12/18 0339) Pulse Rate:  [60-78] 64 (12/18 0800) Cardiac Rhythm: Normal sinus rhythm;Heart block (12/18 0701) Resp:  [16-35] 18 (12/18 0800) BP: (93-128)/(57-84) 110/67 (12/18 0800) SpO2:  [87 %-97 %] 91 % (12/18 0800) Weight:  [79.9 kg] 79.9 kg (12/18 0526)  Filed Weights   06/12/20 0422 06/13/20 0600 06/14/20 0526  Weight: 80.2 kg 79.9 kg 79.9 kg    Weight change: 0 kg   Hemodynamic parameters for last 24 hours: CVP:  [4 mmHg-12 mmHg] 12 mmHg  Intake/Output from previous day: 12/17 0701 - 12/18 0700 In: 2558.3 [P.O.:1870; I.V.:42; NG/GT:646.3] Out: 1550 [Urine:1550]  Intake/Output this shift: No intake/output data recorded.  Current Meds: Scheduled Meds: . acetaminophen  650 mg Oral TID  . antiseptic oral rinse  15 mL Mouth Rinse TID  . aspirin EC  81 mg Oral Daily  . Chlorhexidine Gluconate Cloth  6 each Topical Daily  . docusate sodium  200 mg Oral Daily  . feeding supplement  237 mL Oral TID BM  . feeding supplement (OSMOLITE 1.5 CAL)  1,000 mL Per Tube Q24H  . feeding supplement (PROSource TF)  45 mL Per Tube BID   . ferrous [id number] C-folic acid  1 capsule Oral BID PC  . insulin aspart  0-15 Units Subcutaneous Q4H  . mouth rinse  15 mL Mouth Rinse BID  . midodrine  10 mg Oral TID  . mirtazapine  7.5 mg Oral QHS  . multivitamin with minerals  1 tablet Oral Daily  . pantoprazole  40 mg Oral Daily  . senna  1 tablet Oral BID  . tamsulosin  0.4 mg Oral Daily   Continuous Infusions: . sodium chloride Stopped (06/08/20 0941)  . DOBUTamine 1.5 mcg/kg/min (06/14/20 0600)   PRN Meds:.sodium chloride, bisacodyl, Fitzpatrick Alberico's butt cream, melatonin, ondansetron (ZOFRAN) IV, sodium chloride flush, traMADol  General appearance: alert, cooperative and no distress Neurologic: intact Heart: regular rate and rhythm Lungs: diminished breath sounds bibasilar Abdomen: soft, non-tender; bowel sounds normal; no masses,  no organomegaly Extremities: edema Bilateral lower extremity edema Wound: Sternum intact  Lab Results: CBC: Recent Labs    06/12/20 0437 06/13/20 0439  WBC 8.2 6.9  HGB 8.9* 8.9*  HCT 28.3* 27.4*  PLT 151 155   BMET:  Recent Labs    06/13/20 0439 06/14/20 0410  NA 139 139  K 4.8 4.5  CL 101 103  CO2 29 27  GLUCOSE 149* 151*  BUN 40* 38*  CREATININE 1.37* 1.23  CALCIUM  8.6* 8.5*    CMET: Lab Results  Component Value Date   WBC 6.9 06/13/2020   HGB 8.9 (L) 06/13/2020   HCT 27.4 (L) 06/13/2020   PLT 155 06/13/2020   GLUCOSE 151 (H) 06/14/2020   ALT 20 06/02/2020   AST 26 06/02/2020   NA 139 06/14/2020   K 4.5 06/14/2020   CL 103 06/14/2020   CREATININE 1.23 06/14/2020   BUN 38 (H) 06/14/2020   CO2 27 06/14/2020   TSH 1.920 06/13/2020   INR 1.3 (H) 05/23/2020   HGBA1C 6.2 (H) 05/16/2020      PT/INR: No results for input(s): LABPROT, INR in the last 72 hours. Radiology: No results found.   Assessment/Plan: S/P Procedure(s) (LRB): AORTIC VALVE REPLACEMENT (AVR) USING EDWARDS RESILIA 29 MM AORTIC VALVE. (N/A) CORONARY ARTERY BYPASS GRAFTING (CABG)  USING LIMA to LAD; ENDOSCOPIC HARVESTED RIGHT GREATER SAPHENOUS VEIN: SVG to PD; SVG to SEQUENCED INTERMEDIATE to OM (N/A) TRANSESOPHAGEAL ECHOCARDIOGRAM (TEE) (N/A) ENDOVEIN HARVEST OF GREATER SAPHENOUS VEIN (Right) Continues on low-dose dobutamine Still with edema No recurrent fever or chills Encourage walking  Grace Isaac 06/14/2020 10:25 AM

## 2020-06-14 NOTE — Progress Notes (Signed)
Daily Progress Note   Patient Name: [name] [name]       Date: 06/14/2020 DOB: [birth year]  Age: 86 y.o. MRN#: [id number] Attending Physician: Gaye Pollack, MD Primary Care Physician: [name], MD Admit Date: 05/16/2020  Reason for Consultation/Follow-up: symptom management   Subjective: Patient reports he slept better last night than he has in the past 4 nights and smiles at me.   Unfortunately he still feels constipated and tells me that his last two walks have been shorter than before because he is SOB.   He is eating today although I'm not certain of how much.  He asks me to call Tomi Bamberger on the phone.  He asks that we exchange phone numbers.  Tomi Bamberger and I discussed his symptoms.  Interestingly Tomi Bamberger says that fatigue and lack of appetite are the symptoms that made him come into the hospital.  She reassures Laurel that it will take time to get over such a major surgery.  She is on her way to visit in the next hour.  We discussed the use of Xanax for sleep and remeron.  Tomi Bamberger says at times he uses ducolax at home when his bowels are slow.  We agree to try a dose of that today and continue the senna for on-going bowel function.  Mr. Reede is SOB speaking with me.  He regularly stops to breathe mid sentence.  Assessment: 86 yo gentleman recovering from CABG and AVR in late November.  Seems to have slept better last night.  Will likely eat better if he does not feel constipated.   Patient Profile/HPI:  86 y.o. male  with past medical history of prostate cancer with obstructive uropathy, peripheral neuropathy, colitis, CAD, AS, CHF who was admitted on 05/16/2020 with acute on chronic heart failure exacerbation.  He was found to have severe 3V CAD and aortic stenosis.  He underwent CABG x 4  and AVR on 05/23/2020.  Post op echocardiogram 12/1 shows LVEF of 30 - 35 % and moderate to severe mitral valve regurgitation with an avg HR of 124.  PMT was asked for recommendations regarding symptoms of poor appetite, depression, and insomnia.    Length of Stay: 29   Vital Signs: BP 126/75   Pulse 73   Temp 97.8 F (36.6 C) (Axillary)   Resp (!) 21  Ht 5\' 9"  (1.753 m)   Wt 79.9 kg   SpO2 99%   BMI 26.01 kg/m  SpO2: SpO2: 99 % O2 Device: O2 Device: Nasal Cannula O2 Flow Rate: O2 Flow Rate (L/min): 3 L/min       Palliative Assessment/Data: 50%     Palliative Care Plan    Recommendations/Plan:  Will add 1 dose of bisacodyl to start his bowels moving.  Continue senna once daily as an on-going regimen.  He responded well to low dose xanax for sleep.  Will schedule it QHS.  Primary complaint today is fatigue.  Perhaps another couple of good nights sleep will help.  Could consider very low dose stimulant in am (2.5 mg q am) but only for a short time period.  I will not order this medication but defer to the primary team.  Recommend Palliative Care to follow at SNF.  Code Status:  Full code  Prognosis:   Unable to determine   Discharge Planning:  [facility] for rehab with Palliative care service follow-up  Care plan was discussed with patient and his wife.  Thank you for allowing the Palliative Medicine Team to assist in the care of this patient.  Total time spent:  35 min.     Greater than 50%  of this time was spent counseling and coordinating care related to the above assessment and plan.  Florentina Jenny, PA-C Palliative Medicine  Please contact Palliative MedicineTeam phone at [phone] for questions and concerns between 7 am - 7 pm.   Please see AMION for individual provider pager numbers.

## 2020-06-14 NOTE — Progress Notes (Signed)
Patient ID: [name] [name], male   DOB: 05-25-1928, 86 y.o.   MRN: [id number]     Advanced Heart Failure Rounding Note  PCP-Cardiologist: Peter Martinique, MD   Subjective:    Remains on dobutamine 1.5. Co-ox 76%. CVP 8  Continues to complain of weakness and fatigue. Able to walk unit with PT.   Met with Palliative Care yesterday and meds adjusted.   Denies CP or SOB.     Objective:   Weight Range: 79.9 kg Body mass index is 26.01 kg/m.   Vital Signs:   Temp:  [97.8 F (36.6 C)-98.9 F (37.2 C)] 97.8 F (36.6 C) (12/18 1100) Pulse Rate:  [60-78] 73 (12/18 1200) Resp:  [16-35] 21 (12/18 1200) BP: (93-137)/(60-84) 126/75 (12/18 1200) SpO2:  [87 %-99 %] 99 % (12/18 1200) Weight:  [79.9 kg] 79.9 kg (12/18 0526) Last BM Date: 06/12/20  Weight change: Filed Weights   06/12/20 0422 06/13/20 0600 06/14/20 0526  Weight: 80.2 kg 79.9 kg 79.9 kg    Intake/Output:   Intake/Output Summary (Last 24 hours) at 06/14/2020 1216 Last data filed at 06/14/2020 1210 Gross per 24 hour  Intake 2895.56 ml  Output 2000 ml  Net 895.56 ml      Physical Exam   CVP 7-8 General:  Elderly male. No resp difficulty HEENT: normal + cor-trak Neck: supple. CVP 8  Carotids 2+ bilat; no bruits. No lymphadenopathy or thryomegaly appreciated. Cor: PMI nondisplaced. Regular rate & rhythm. No rubs, gallops or murmurs. Lungs: clear Abdomen: soft, nontender, nondistended. No hepatosplenomegaly. No bruits or masses. Good bowel sounds. Extremities: no cyanosis, clubbing, rash, tr edema + UNNA Neuro: alert & orientedx3, cranial nerves grossly intact. moves all 4 extremities w/o difficulty. Affect pleasant   Telemetry   Sinus rhythm w/ 1st degree AVB, 70s Personally reviewed  Labs    CBC Recent Labs    06/12/20 0437 06/13/20 0439  WBC 8.2 6.9  HGB 8.9* 8.9*  HCT 28.3* 27.4*  MCV 98.6 97.5  PLT 151 683   Basic Metabolic Panel Recent Labs    06/13/20 0439 06/14/20 0410  NA 139 139  K  4.8 4.5  CL 101 103  CO2 29 27  GLUCOSE 149* 151*  BUN 40* 38*  CREATININE 1.37* 1.23  CALCIUM 8.6* 8.5*   Liver Function Tests No results for input(s): AST, ALT, ALKPHOS, BILITOT, PROT, ALBUMIN in the last 72 hours. No results for input(s): LIPASE, AMYLASE in the last 72 hours. Cardiac Enzymes No results for input(s): CKTOTAL, CKMB, CKMBINDEX, TROPONINI in the last 72 hours.  BNP: BNP (last 3 results) Recent Labs    11/19/19 1550 05/16/20 1736  BNP 1,139.7* 2,005.2*    ProBNP (last 3 results) Recent Labs    01/02/20 1134  PROBNP 1,167.0*     D-Dimer No results for input(s): DDIMER in the last 72 hours. Hemoglobin A1C No results for input(s): HGBA1C in the last 72 hours. Fasting Lipid Panel No results for input(s): CHOL, HDL, LDLCALC, TRIG, CHOLHDL, LDLDIRECT in the last 72 hours. Thyroid Function Tests Recent Labs    06/13/20 2100  TSH 1.920    Other results:   Imaging    No results found.   Medications:     Scheduled Medications: . acetaminophen  650 mg Oral TID  . ALPRAZolam  0.25 mg Oral QHS  . antiseptic oral rinse  15 mL Mouth Rinse TID  . aspirin EC  81 mg Oral Daily  . Chlorhexidine Gluconate Cloth  6 each Topical Daily  .  docusate sodium  200 mg Oral Daily  . feeding supplement  237 mL Oral TID BM  . feeding supplement (OSMOLITE 1.5 CAL)  1,000 mL Per Tube Q24H  . feeding supplement (PROSource TF)  45 mL Per Tube BID  . ferrous [id number] C-folic acid  1 capsule Oral BID PC  . insulin aspart  0-15 Units Subcutaneous Q4H  . mouth rinse  15 mL Mouth Rinse BID  . midodrine  10 mg Oral TID  . mirtazapine  7.5 mg Oral QHS  . multivitamin with minerals  1 tablet Oral Daily  . pantoprazole  40 mg Oral Daily  . senna  1 tablet Oral BID  . tamsulosin  0.4 mg Oral Daily    Infusions: . sodium chloride Stopped (06/08/20 0941)  . DOBUTamine 1.5 mcg/kg/min (06/14/20 1210)    PRN Medications: sodium chloride, bisacodyl,  Gerhardt's butt cream, melatonin, ondansetron (ZOFRAN) IV, sodium chloride flush, traMADol   Assessment/Plan   1. Severe 3V CAD: - s/p CABGx 4 (LIMA-LAD, sequential SVG-OM1 and OM2, SVG- PDA) - No s/s ischemia - continue ASA 81 - no ? blocker w/ post-cardiotomy shock and low BP - Atorva started 12/7 and stopped 12/8. Doubt this is contributing to his weakness but ok to hold   2. Aortic Stenosis: - severe low flow low gradient AS - s/p tissue AVR 11/26  - AV prosthesis ok on post op echo - stable  3. Acute on Chronic Biventricular Heart Failure:  - Echo in 2014 showed normal LVEF 55-60% w/ G1DD.  - Echo 10/21 and EF 30-35% w/ global HK. RV systolic function mildly reduced. - LHC this admit w/ severe 3V CAD>>ICM. Now s/p CABG. Also w/ severe AS, possibly contributing to LV dysfunction, now s/p SAVR.  - post op recovery c/b difficulties weaning pressors/inotropes.  - Post-op echo 12/1 EF 30-35%, RV mildly reduced. AV prothesis ok - off NE - Remains on dobutamine 1.5 mcg. Co-ox 75%. Will stop today and follow co-ox - CVP and weight stable off lasix. Continue to hold. Can restart as needed - On midodrine for BP support. Wil continue - Off digoxin with ? heart block.  - Continue LE compression    4. Post Operative Atrial Fibrillation:   - Currently NSR  - Monitor on tele while on DBA - defer anticoagulation to CT surgery    5. AKI:   - Baseline <1.0.  -Creatinine 1.2 today.  - Hold diuretics today and follow BMP   6. Hyponatremia - resolved  7. Anemia - expected ABLA from surgery  -  on PO Fe  - Hgb stable 8.9  8. Debility - Major issue currently. PT/OT following.  - Recommending SNF.   9. Urinary Retention - Prior to admit I&O cath at home.  - Continues with I&O cath every 6 hours.   10. PNA  - CXR w/ bilateral infiltrates, PCT 0.26  - completed Vanc + cefepime x 7 days - resolved  11. Rhythm - insinus with long 1AVB - Digoxin discontinued.   12.  Constipation  - Resolved.  - continue to mobilize  13. Insomnia/anxiety/depression  - appreciate Palliative Care's recommendations    Glori Bickers, MD 06/14/2020 12:16 PM

## 2020-06-15 ENCOUNTER — Inpatient Hospital Stay (HOSPITAL_COMMUNITY): Payer: Medicare HMO

## 2020-06-15 DIAGNOSIS — E440 Moderate protein-calorie malnutrition: Secondary | ICD-10-CM | POA: Diagnosis not present

## 2020-06-15 DIAGNOSIS — Z515 Encounter for palliative care: Secondary | ICD-10-CM | POA: Diagnosis not present

## 2020-06-15 LAB — CBC
HCT: 30.8 % — ABNORMAL LOW (ref 39.0–52.0)
Hemoglobin: 9.3 g/dL — ABNORMAL LOW (ref 13.0–17.0)
MCH: 30.2 pg (ref 26.0–34.0)
MCHC: 30.2 g/dL (ref 30.0–36.0)
MCV: 100 fL (ref 80.0–100.0)
Platelets: 157 10*3/uL (ref 150–400)
RBC: 3.08 MIL/uL — ABNORMAL LOW (ref 4.22–5.81)
RDW: 15.4 % (ref 11.5–15.5)
WBC: 7.1 10*3/uL (ref 4.0–10.5)
nRBC: 0 % (ref 0.0–0.2)

## 2020-06-15 LAB — BASIC METABOLIC PANEL
Anion gap: 7 (ref 5–15)
BUN: 38 mg/dL — ABNORMAL HIGH (ref 8–23)
CO2: 27 mmol/L (ref 22–32)
Calcium: 8.6 mg/dL — ABNORMAL LOW (ref 8.9–10.3)
Chloride: 105 mmol/L (ref 98–111)
Creatinine, Ser: 1.14 mg/dL (ref 0.61–1.24)
GFR, Estimated: >60 mL/min (ref >60)
Glucose, Bld: 155 mg/dL — ABNORMAL HIGH (ref 70–99)
Potassium: 4.5 mmol/L (ref 3.5–5.1)
Sodium: 139 mmol/L (ref 135–145)

## 2020-06-15 LAB — GLUCOSE, CAPILLARY
Glucose-Capillary: 101 mg/dL — ABNORMAL HIGH (ref 70–99)
Glucose-Capillary: 119 mg/dL — ABNORMAL HIGH (ref 70–99)
Glucose-Capillary: 146 mg/dL — ABNORMAL HIGH (ref 70–99)
Glucose-Capillary: 164 mg/dL — ABNORMAL HIGH (ref 70–99)
Glucose-Capillary: 95 mg/dL (ref 70–99)
Glucose-Capillary: 96 mg/dL (ref 70–99)

## 2020-06-15 LAB — COOXEMETRY PANEL
Carboxyhemoglobin: 1.5 % (ref 0.5–1.5)
Methemoglobin: 0.8 % (ref 0.0–1.5)
O2 Saturation: 70.7 %
Total hemoglobin: 9.2 g/dL — ABNORMAL LOW (ref 12.0–16.0)

## 2020-06-15 LAB — T3, FREE: T3, Free: 1.6 pg/mL — ABNORMAL LOW (ref 2.0–4.4)

## 2020-06-15 NOTE — Progress Notes (Addendum)
[address]       [place] [address]             [phone]      23 Days Post-Op Procedure(s) (LRB): AORTIC VALVE REPLACEMENT (AVR) USING EDWARDS RESILIA 29 MM AORTIC VALVE. (N/A) CORONARY ARTERY BYPASS GRAFTING (CABG) USING LIMA to LAD; ENDOSCOPIC HARVESTED RIGHT GREATER SAPHENOUS VEIN: SVG to PD; SVG to SEQUENCED INTERMEDIATE to OM (N/A) TRANSESOPHAGEAL ECHOCARDIOGRAM (TEE) (N/A) ENDOVEIN HARVEST OF GREATER SAPHENOUS VEIN (Right) Subjective: Sleepy this am  Objective: Vital signs in last 24 hours: Temp:  [97.5 F (36.4 C)-98 F (36.7 C)] 97.8 F (36.6 C) (12/19 0745) Pulse Rate:  [63-77] 63 (12/19 0348) Cardiac Rhythm: Normal sinus rhythm (12/18 2110) Resp:  [16-22] 18 (12/19 0745) BP: (101-137)/(64-85) 120/79 (12/19 0745) SpO2:  [93 %-99 %] 96 % (12/19 0348)  Hemodynamic parameters for last 24 hours: CVP:  [6 mmHg-11 mmHg] 11 mmHg  Intake/Output from previous day: 12/18 0701 - 12/19 0700 In: 671.7 [I.V.:11.7; NG/GT:660] Out: 450 [Urine:450] Intake/Output this shift: Total I/O In: -  Out: 1500 [Urine:1500]  General appearance: cooperative, distracted and fatigued Heart: regular rate and rhythm Lungs: sim in bases Abdomen: some distension, soft, non-tender Extremities: UNNA boots in place Wound: incis healing well  Lab Results: Recent Labs    06/13/20 0439 06/15/20 0345  WBC 6.9 7.1  HGB 8.9* 9.3*  HCT 27.4* 30.8*  PLT 155 157   BMET:  Recent Labs    06/14/20 0410 06/15/20 0345  NA 139 139  K 4.5 4.5  CL 103 105  CO2 27 27  GLUCOSE 151* 155*  BUN 38* 38*  CREATININE 1.23 1.14  CALCIUM 8.5* 8.6*    PT/INR: No results for input(s): LABPROT, INR in the last 72 hours. ABG    Component Value Date/Time   PHART 7.381 05/26/2020 1750   HCO3 22.2 05/26/2020 1750   TCO2 23 05/26/2020 1750   ACIDBASEDEF 2.0 05/26/2020 1750   O2SAT 70.7 06/15/2020 0345   CBG (last 3)  Recent Labs    06/15/20 0021 06/15/20 0435  06/15/20 0725  GLUCAP 119* 146* 95    Meds Scheduled Meds: . acetaminophen  650 mg Oral TID  . ALPRAZolam  0.25 mg Oral QHS  . antiseptic oral rinse  15 mL Mouth Rinse TID  . aspirin EC  81 mg Oral Daily  . Chlorhexidine Gluconate Cloth  6 each Topical Daily  . docusate sodium  200 mg Oral Daily  . feeding supplement  237 mL Oral TID BM  . feeding supplement (OSMOLITE 1.5 CAL)  1,000 mL Per Tube Q24H  . feeding supplement (PROSource TF)  45 mL Per Tube BID  . ferrous [id number] C-folic acid  1 capsule Oral BID PC  . insulin aspart  0-15 Units Subcutaneous Q4H  . mouth rinse  15 mL Mouth Rinse BID  . midodrine  10 mg Oral TID  . mirtazapine  7.5 mg Oral QHS  . multivitamin with minerals  1 tablet Oral Daily  . pantoprazole  40 mg Oral Daily  . senna  1 tablet Oral Daily  . tamsulosin  0.4 mg Oral Daily   Continuous Infusions: . sodium chloride Stopped (06/08/20 0941)   PRN Meds:.sodium chloride, bisacodyl, Lealer Marsland's butt cream, melatonin, ondansetron (ZOFRAN) IV, sodium chloride flush, traMADol  Xrays No results found.  Assessment/Plan: S/P Procedure(s) (LRB): AORTIC VALVE REPLACEMENT (AVR) USING EDWARDS RESILIA 29 MM AORTIC VALVE. (N/A) CORONARY ARTERY BYPASS GRAFTING (CABG) USING  LIMA to LAD; ENDOSCOPIC HARVESTED RIGHT GREATER SAPHENOUS VEIN: SVG to PD; SVG to SEQUENCED INTERMEDIATE to OM (N/A) TRANSESOPHAGEAL ECHOCARDIOGRAM (TEE) (N/A) ENDOVEIN HARVEST OF GREATER SAPHENOUS VEIN (Right)  1 afeb, VSS, sinus rhythm , Co-Ox is 70- AHF team assisting with management- improved 2 sats good on 3 liters 3 no leukocytosis 4 H/H improved 5 BUN/Creat improved 6 CXR stable to slightly improved aeration in lower fields, L effus appears stable- now completed 7 days of abx 7 seen by palliative- assisting with care 8 cont to push rehab/pulm toilet as able    LOS: 30 days    John Giovanni PA-C Pager [phone] 06/15/2020 More alert today  COX  stable after  dobutamine off I have seen and examined [name] and agree with the above assessment  and plan.  Grace Isaac MD Beeper [phone] Office [phone] 06/15/2020 12:11 PM

## 2020-06-15 NOTE — Progress Notes (Signed)
Pt ambulated in a room and little outside of room almost 30 ft , complained dizziness, although vitals stable.[name] Kitchenoxygen saturation 97 @ 2l of oxygen, wife is in bed side, will continue to monitor the patient  Palma Holter, RN

## 2020-06-15 NOTE — Progress Notes (Signed)
Patient ID: [name] [name], male   DOB: 19-Oct-1927, 86 y.o.   MRN: [id number]     Advanced Heart Failure Rounding Note  PCP-Cardiologist: Peter Martinique, MD   Subjective:    Dobutamine stopped yesterday. Co-ox stable at 71%. Weight stable off lasix.  CVP 11  Seen by Palliative Care yesterday and multiple med changes made.   Feels better today. Slept well. Mild SOB. No orthopnea or PND. + BM     Objective:   Weight Range: 79.9 kg Body mass index is 26.01 kg/m.   Vital Signs:   Temp:  [97.5 F (36.4 C)-98 F (36.7 C)] 97.8 F (36.6 C) (12/19 0745) Pulse Rate:  [63-77] 63 (12/19 0348) Resp:  [16-22] 18 (12/19 0745) BP: (101-137)/(64-85) 120/79 (12/19 0745) SpO2:  [92 %-99 %] 96 % (12/19 0348) Last BM Date: 06/12/20  Weight change: Filed Weights   06/12/20 0422 06/13/20 0600 06/14/20 0526  Weight: 80.2 kg 79.9 kg 79.9 kg    Intake/Output:   Intake/Output Summary (Last 24 hours) at 06/15/2020 0914 Last data filed at 06/14/2020 1800 Gross per 24 hour  Intake 671.73 ml  Output 450 ml  Net 221.73 ml      Physical Exam   General:  Elderly male. No resp difficulty HEENT: normal Neck: supple. JVPto jaw Carotids 2+ bilat; no bruits. No lymphadenopathy or thryomegaly appreciated. Cor: PMI nondisplaced. Regular rate & rhythm. No rubs, gallops or murmurs. Lungs: clear Abdomen: soft, nontender, nondistended. No hepatosplenomegaly. No bruits or masses. Good bowel sounds. Extremities: no cyanosis, clubbing, rash, edema Neuro: alert & orientedx3, cranial nerves grossly intact. moves all 4 extremities w/o difficulty. Affect pleasant   Telemetry   Sinus rhythm w/ 1st degree AVB, 60-70s Personally reviewed  Labs    CBC Recent Labs    06/13/20 0439 06/15/20 0345  WBC 6.9 7.1  HGB 8.9* 9.3*  HCT 27.4* 30.8*  MCV 97.5 100.0  PLT 155 656   Basic Metabolic Panel Recent Labs    06/14/20 0410 06/15/20 0345  NA 139 139  K 4.5 4.5  CL 103 105  CO2 27 27  GLUCOSE  151* 155*  BUN 38* 38*  CREATININE 1.23 1.14  CALCIUM 8.5* 8.6*   Liver Function Tests No results for input(s): AST, ALT, ALKPHOS, BILITOT, PROT, ALBUMIN in the last 72 hours. No results for input(s): LIPASE, AMYLASE in the last 72 hours. Cardiac Enzymes No results for input(s): CKTOTAL, CKMB, CKMBINDEX, TROPONINI in the last 72 hours.  BNP: BNP (last 3 results) Recent Labs    11/19/19 1550 05/16/20 1736  BNP 1,139.7* 2,005.2*    ProBNP (last 3 results) Recent Labs    01/02/20 1134  PROBNP 1,167.0*     D-Dimer No results for input(s): DDIMER in the last 72 hours. Hemoglobin A1C No results for input(s): HGBA1C in the last 72 hours. Fasting Lipid Panel No results for input(s): CHOL, HDL, LDLCALC, TRIG, CHOLHDL, LDLDIRECT in the last 72 hours. Thyroid Function Tests Recent Labs    06/13/20 2100  TSH 1.920    Other results:   Imaging    No results found.   Medications:     Scheduled Medications: . acetaminophen  650 mg Oral TID  . ALPRAZolam  0.25 mg Oral QHS  . antiseptic oral rinse  15 mL Mouth Rinse TID  . aspirin EC  81 mg Oral Daily  . Chlorhexidine Gluconate Cloth  6 each Topical Daily  . docusate sodium  200 mg Oral Daily  . feeding supplement  237 mL Oral TID BM  . feeding supplement (OSMOLITE 1.5 CAL)  1,000 mL Per Tube Q24H  . feeding supplement (PROSource TF)  45 mL Per Tube BID  . ferrous [id number] C-folic acid  1 capsule Oral BID PC  . insulin aspart  0-15 Units Subcutaneous Q4H  . mouth rinse  15 mL Mouth Rinse BID  . midodrine  10 mg Oral TID  . mirtazapine  7.5 mg Oral QHS  . multivitamin with minerals  1 tablet Oral Daily  . pantoprazole  40 mg Oral Daily  . senna  1 tablet Oral Daily  . tamsulosin  0.4 mg Oral Daily    Infusions: . sodium chloride Stopped (06/08/20 0941)    PRN Medications: sodium chloride, bisacodyl, Gerhardt's butt cream, melatonin, ondansetron (ZOFRAN) IV, sodium chloride flush,  traMADol   Assessment/Plan   1. Severe 3V CAD: - s/p CABGx 4 (LIMA-LAD, sequential SVG-OM1 and OM2, SVG- PDA) - No s/s ischemia - continue ASA 81 - no ? blocker w/ post-cardiotomy shock and low BP - Atorva started 12/7 and stopped 12/8. Doubt this is contributing to his weakness but ok to hold   2. Aortic Stenosis: - severe low flow low gradient AS - s/p tissue AVR 11/26  - AV prosthesis ok on post op echo - stable  3. Acute on Chronic Biventricular Heart Failure:  - Echo in 2014 showed normal LVEF 55-60% w/ G1DD.  - Echo 10/21 and EF 30-35% w/ global HK. RV systolic function mildly reduced. - LHC this admit w/ severe 3V CAD>>ICM. Now s/p CABG. Also w/ severe AS, possibly contributing to LV dysfunction, now s/p SAVR.  - post op recovery c/b difficulties weaning pressors/inotropes.  - Post-op echo 12/1 EF 30-35%, RV mildly reduced. AV prothesis ok - off NE - Dobutamine stopped 12/18  Co-ox stable at 71% - Weight stable of diuretics but CVP up and more dyspneic. Will give lasix 40IV today. Will likely need some daily oral dosing.  - On midodrine for BP support. Wil continue - Off digoxin with ? heart block.  - Continue LE compression    4. Post Operative Atrial Fibrillation:   - Remains in NSR - defer anticoagulation to CT surgery    5. AKI:   - Baseline <1.0.  - Resolved. Creatinine 1.14 today.   6. Hyponatremia - resolved  7. Anemia - expected ABLA from surgery  -  on PO Fe  - Hgb improved 9.3  8. Debility - Major issue currently. PT/OT following.  - Recommending SNF.   9. Urinary Retention - Prior to admit I&O cath at home.  - Continues with I&O cath every 6 hours.   10. PNA  - CXR w/ bilateral infiltrates, PCT 0.26  - completed Vanc + cefepime x 7 days - resolved  11. Rhythm - insinus with long 1AVB - Digoxin discontinued.   12. Constipation  - On senna. Can give sorbitol as needed  13. Insomnia/anxiety/depression  - appreciate Palliative  Care's recommendations - ok to proceed with stimulant if felt he needs it    Glori Bickers, MD 06/15/2020 9:14 AM

## 2020-06-15 NOTE — Progress Notes (Signed)
Daily Progress Note   Patient Name: [name] [name]       Date: 06/15/2020 DOB: 30-Jan-1928  Age: 86 y.o. MRN#: [id number] Attending Physician: Gaye Pollack, MD Primary Care Physician: [name], MD Admit Date: 05/16/2020  Reason for Consultation/Follow-up: symptom management  Subjective: Patient reports sleeping well again last night.  He also had a bowel movement last night.  Ate tomato soup.  Complains that he has not been out of bed today. He's watching the game routing for the [place].   Assessment: Symptoms improving.     Patient Profile/HPI:  86 y.o.malewith past medical history of prostate cancer with obstructive uropathy, peripheral neuropathy, colitis, CAD, AS, CHFwho was admitted on 11/19/2021with acute on chronic heart failure exacerbation. He was found to have severe 3V CAD and aortic stenosis. He underwent CABG x 4 and AVR on 05/23/2020. Post op echocardiogram 12/1 shows LVEF of 30 - 35 % and moderate to severe mitral valve regurgitation with an avg HR of 124.  PMT was asked for recommendations regarding symptoms of poor appetite, depression, and insomnia.    Length of Stay: 30   Vital Signs: BP 123/80 (BP Location: Left Arm)   Pulse 71   Temp 97.8 F (36.6 C) (Axillary)   Resp 16   Ht 5\' 9"  (1.753 m)   Wt 79.9 kg   SpO2 95%   BMI 26.01 kg/m  SpO2: SpO2: 95 % O2 Device: O2 Device: Nasal Cannula O2 Flow Rate: O2 Flow Rate (L/min): 3 L/min       Palliative Assessment/Data: 40 - 50%     Palliative Care Plan    Recommendations/Plan:  Insomnia - would continue low dose Xanax 0.25 mg and low dose remeron 7.5 mg  Constipation - Continue senna.    Appetite - Remeron should help over time.  I also wonder if he would eat more if his tube feeds  were paused.   Would consider pausing tube feeds (leave cor trak in place) to track how much he will take in on his own volition.  Depression - improvement in the above symptoms will likely help his depression.  Ambulate multiple times a day.  Per patient he has not been out of bed today.  Will request RN / Tech to get him up.  Recommend Palliative to follow at SNF.  [facility] team will continue to follow intermittently.  Please call with any concerns/questions.  Code Status:  Full code  Prognosis:   Unable to determine   Discharge Planning:  [place] for rehab with Palliative care service follow-up  Care plan was discussed with patient.  Thank you for allowing the Palliative Medicine Team to assist in the care of this patient.  Total time spent:  15 min.     Greater than 50%  of this time was spent counseling and coordinating care related to the above assessment and plan.  Florentina Jenny, PA-C Palliative Medicine  Please contact Palliative MedicineTeam phone at [phone] for questions and concerns between 7 am - 7 pm.   Please see AMION for individual provider pager numbers.

## 2020-06-15 NOTE — Plan of Care (Signed)
  Problem: Education: Goal: Knowledge of disease and its progression will improve Outcome: Progressing   Problem: Fluid Volume: Goal: Compliance with measures to maintain balanced fluid volume will improve Outcome: Progressing   Problem: Education: Goal: Knowledge of General Education information will improve Description: Including pain rating scale, medication(s)/side effects and non-pharmacologic comfort measures Outcome: Progressing   Problem: Health Behavior/Discharge Planning: Goal: Ability to manage health-related needs will improve Outcome: Progressing   Problem: Clinical Measurements: Goal: Ability to maintain clinical measurements within normal limits will improve Outcome: Progressing Goal: Will remain free from infection Outcome: Progressing Goal: Diagnostic test results will improve Outcome: Progressing Goal: Respiratory complications will improve Outcome: Progressing Goal: Cardiovascular complication will be avoided Outcome: Progressing   Problem: Activity: Goal: Risk for activity intolerance will decrease Outcome: Progressing   Problem: Nutrition: Goal: Adequate nutrition will be maintained Outcome: Progressing   Problem: Coping: Goal: Level of anxiety will decrease Outcome: Progressing   Problem: Elimination: Goal: Will not experience complications related to bowel motility Outcome: Progressing Goal: Will not experience complications related to urinary retention Outcome: Progressing   Problem: Skin Integrity: Goal: Risk for impaired skin integrity will decrease Outcome: Progressing   Problem: Education: Goal: Ability to demonstrate management of disease process will improve Outcome: Progressing Goal: Ability to verbalize understanding of medication therapies will improve Outcome: Progressing Goal: Individualized Educational Video(s) Outcome: Progressing   Problem: Activity: Goal: Capacity to carry out activities will improve Outcome:  Progressing   Problem: Cardiac: Goal: Ability to achieve and maintain adequate cardiopulmonary perfusion will improve Outcome: Progressing   Problem: Education: Goal: Will demonstrate proper wound care and an understanding of methods to prevent future damage Outcome: Progressing Goal: Knowledge of disease or condition will improve Outcome: Progressing Goal: Knowledge of the prescribed therapeutic regimen will improve Outcome: Progressing Goal: Individualized Educational Video(s) Outcome: Progressing   Problem: Activity: Goal: Risk for activity intolerance will decrease Outcome: Progressing   Problem: Cardiac: Goal: Will achieve and/or maintain hemodynamic stability Outcome: Progressing   Problem: Clinical Measurements: Goal: Postoperative complications will be avoided or minimized Outcome: Progressing   Problem: Respiratory: Goal: Respiratory status will improve Outcome: Progressing   Problem: Skin Integrity: Goal: Wound healing without signs and symptoms of infection Outcome: Progressing Goal: Risk for impaired skin integrity will decrease Outcome: Progressing   Problem: Urinary Elimination: Goal: Ability to achieve and maintain adequate renal perfusion and functioning will improve Outcome: Progressing

## 2020-06-16 ENCOUNTER — Inpatient Hospital Stay (HOSPITAL_COMMUNITY): Payer: Medicare HMO

## 2020-06-16 DIAGNOSIS — E440 Moderate protein-calorie malnutrition: Secondary | ICD-10-CM | POA: Diagnosis not present

## 2020-06-16 DIAGNOSIS — R52 Pain, unspecified: Secondary | ICD-10-CM | POA: Diagnosis not present

## 2020-06-16 DIAGNOSIS — Z515 Encounter for palliative care: Secondary | ICD-10-CM | POA: Diagnosis not present

## 2020-06-16 LAB — BASIC METABOLIC PANEL
Anion gap: 8 (ref 5–15)
BUN: 38 mg/dL — ABNORMAL HIGH (ref 8–23)
CO2: 26 mmol/L (ref 22–32)
Calcium: 8.7 mg/dL — ABNORMAL LOW (ref 8.9–10.3)
Chloride: 107 mmol/L (ref 98–111)
Creatinine, Ser: 1.19 mg/dL (ref 0.61–1.24)
GFR, Estimated: 58 mL/min — ABNORMAL LOW (ref >60)
Glucose, Bld: 96 mg/dL (ref 70–99)
Potassium: 5 mmol/L (ref 3.5–5.1)
Sodium: 141 mmol/L (ref 135–145)

## 2020-06-16 LAB — GLUCOSE, CAPILLARY
Glucose-Capillary: 107 mg/dL — ABNORMAL HIGH (ref 70–99)
Glucose-Capillary: 112 mg/dL — ABNORMAL HIGH (ref 70–99)
Glucose-Capillary: 113 mg/dL — ABNORMAL HIGH (ref 70–99)
Glucose-Capillary: 132 mg/dL — ABNORMAL HIGH (ref 70–99)
Glucose-Capillary: 139 mg/dL — ABNORMAL HIGH (ref 70–99)
Glucose-Capillary: 141 mg/dL — ABNORMAL HIGH (ref 70–99)

## 2020-06-16 LAB — COOXEMETRY PANEL
Carboxyhemoglobin: 1.4 % (ref 0.5–1.5)
Methemoglobin: 0.6 % (ref 0.0–1.5)
O2 Saturation: 64.7 %
Total hemoglobin: 8.4 g/dL — ABNORMAL LOW (ref 12.0–16.0)

## 2020-06-16 MED ORDER — FUROSEMIDE 40 MG PO TABS
40.0000 mg | ORAL_TABLET | Freq: Every day | ORAL | Status: DC
  Administered 2020-06-16 – 2020-06-26 (×11): 40 mg via ORAL
  Filled 2020-06-16 (×11): qty 1

## 2020-06-16 MED ORDER — OXYCODONE HCL 5 MG PO TABS: 2.5000 mg | ORAL_TABLET | Freq: Two times a day (BID) | ORAL | Status: DC | PRN

## 2020-06-16 MED ORDER — FUROSEMIDE 40 MG PO TABS: 40.0000 mg | ORAL_TABLET | Freq: Every day | ORAL | Status: DC

## 2020-06-16 NOTE — Plan of Care (Signed)
  Problem: Education: Goal: Knowledge of General Education information will improve Description: Including pain rating scale, medication(s)/side effects and non-pharmacologic comfort measures Outcome: Progressing   Problem: Health Behavior/Discharge Planning: Goal: Ability to manage health-related needs will improve Outcome: Progressing   Problem: Clinical Measurements: Goal: Ability to maintain clinical measurements within normal limits will improve Outcome: Progressing Goal: Will remain free from infection Outcome: Progressing Goal: Diagnostic test results will improve Outcome: Progressing Goal: Respiratory complications will improve Outcome: Progressing Goal: Cardiovascular complication will be avoided Outcome: Progressing   Problem: Activity: Goal: Risk for activity intolerance will decrease Outcome: Progressing   Problem: Nutrition: Goal: Adequate nutrition will be maintained Outcome: Progressing   Problem: Coping: Goal: Level of anxiety will decrease Outcome: Progressing   Problem: Elimination: Goal: Will not experience complications related to bowel motility Outcome: Progressing Goal: Will not experience complications related to urinary retention Outcome: Progressing   Problem: Skin Integrity: Goal: Risk for impaired skin integrity will decrease Outcome: Progressing   

## 2020-06-16 NOTE — TOC Progression Note (Addendum)
Transition of Care [facility]) - Progression Note    Patient Details  Name: [name] [name] MRN: [id number] Date of Birth: [birth year]  Transition of Care [facility]) CM/SW [place], [place] Phone Number: 06/16/2020, 2:50 PM  Clinical Narrative:    CSW called pt wife after she requested to talk with TOC to other staff. CSW updated pt wife on pt's likely transfer to 4E. CSW explained [place] is only offer. Wife explained that she did not necessarily commit to [facility] and does not like their ratings. She inquires if it's possible to see if out of network SNF's would take pt with [employer] covering 50% and pt to cover other 50% out of pocket. Wife also explains pt has LTC insurance. CSW and wife also discuss potential for pt to go home with [facility], DME, and aides. Pt  Wife requested to check in to [facility]. CSW gave wife instructions to look up snf and their star ratings on Medicare.gov for further choices.   CSW called and left message with [facility].   [facility] notified CSW they could take an out of network insurance with private pay up front. Cost would be $10,275 up front for 30 days. [facility] will review pt clinically to see if they can accept.   Expected Discharge Plan: [place] Barriers to Discharge: Continued Medical Work up  Expected Discharge Plan and Services Expected Discharge Plan: [place] arrangements for the past 2 months: Single Family Home                                       Social Determinants of Health (SDOH) Interventions    Readmission Risk Interventions No flowsheet data found.

## 2020-06-16 NOTE — Progress Notes (Signed)
Straight cath performed 800cc of urine obtained.

## 2020-06-16 NOTE — Progress Notes (Addendum)
Patient ID: [name] [name], male   DOB: October 14, 1927, 86 y.o.   MRN: [id number]     Advanced Heart Failure Rounding Note  PCP-Cardiologist: Peter Martinique, MD   Subjective:    Dobutamine stopped 12/18. Co-ox stable at 65%. Weight stable off lasix. Wt down 5 lb in 2 days.     CVP 10-11. SBPs low 100s. On midodrine 10 tid. BMP pending   Appetite is still poor. Getting nocturnal tube feeds. He wants to get out of bed and move more. CR currently at bedside to ambulate.    Objective:   Weight Range: 78 kg Body mass index is 25.39 kg/m.   Vital Signs:   Temp:  [97.8 F (36.6 C)-98.7 F (37.1 C)] 98 F (36.7 C) (12/20 0700) Pulse Rate:  [55-84] 55 (12/20 0700) Resp:  [16-32] 32 (12/20 0700) BP: (103-131)/(64-89) 103/64 (12/20 0700) SpO2:  [94 %-96 %] 96 % (12/20 0700) Weight:  [78 kg] 78 kg (12/20 0311) Last BM Date: 06/15/20  Weight change: Filed Weights   06/13/20 0600 06/14/20 0526 06/16/20 0311  Weight: 79.9 kg 79.9 kg 78 kg    Intake/Output:   Intake/Output Summary (Last 24 hours) at 06/16/2020 1015 Last data filed at 06/16/2020 0955 Gross per 24 hour  Intake 2030 ml  Output 500 ml  Net 1530 ml      Physical Exam   CVP 10-11  General:  Elderly male, laying in bed. No resp difficulty HEENT: normal + cor trak  Neck: supple. JVP 9-10 cm Carotids 2+ bilat; no bruits. No lymphadenopathy or thryomegaly appreciated. Cor: PMI nondisplaced. Regular rate & rhythm. No rubs, gallops or murmurs. Lungs: clear, no wheezing  Abdomen: soft, nontender, nondistended. No hepatosplenomegaly. No bruits or masses. Good bowel sounds. Extremities: no cyanosis, clubbing, rash, edema + bilateral unna boots  Neuro: alert & orientedx3, cranial nerves grossly intact. moves all 4 extremities w/o difficulty. Affect pleasant   Telemetry   Sinus bradycardia w/ 1st degree AVB, mid 50s w/ transient junctional bradycardia in mid 50s Personally reviewed  Labs    CBC Recent Labs     06/15/20 0345  WBC 7.1  HGB 9.3*  HCT 30.8*  MCV 100.0  PLT 086   Basic Metabolic Panel Recent Labs    06/14/20 0410 06/15/20 0345  NA 139 139  K 4.5 4.5  CL 103 105  CO2 27 27  GLUCOSE 151* 155*  BUN 38* 38*  CREATININE 1.23 1.14  CALCIUM 8.5* 8.6*   Liver Function Tests No results for input(s): AST, ALT, ALKPHOS, BILITOT, PROT, ALBUMIN in the last 72 hours. No results for input(s): LIPASE, AMYLASE in the last 72 hours. Cardiac Enzymes No results for input(s): CKTOTAL, CKMB, CKMBINDEX, TROPONINI in the last 72 hours.  BNP: BNP (last 3 results) Recent Labs    11/19/19 1550 05/16/20 1736  BNP 1,139.7* 2,005.2*    ProBNP (last 3 results) Recent Labs    01/02/20 1134  PROBNP 1,167.0*     D-Dimer No results for input(s): DDIMER in the last 72 hours. Hemoglobin A1C No results for input(s): HGBA1C in the last 72 hours. Fasting Lipid Panel No results for input(s): CHOL, HDL, LDLCALC, TRIG, CHOLHDL, LDLDIRECT in the last 72 hours. Thyroid Function Tests Recent Labs    06/13/20 2100  TSH 1.920  T3FREE 1.6*    Other results:   Imaging    DG CHEST PORT 1 VIEW  Result Date: 06/16/2020 CLINICAL DATA:  Shortness of breath, pleural effusion, open heart surgery EXAM: PORTABLE  CHEST 1 VIEW COMPARISON:  06/15/2020 FINDINGS: Small to moderate left pleural effusion. Trace right pleural effusion. Pulmonary vascular congestion without frank interstitial edema. Cardiomegaly. Prosthetic valve. Postsurgical changes related to prior CABG. Median sternotomy. Enteric tube courses into the stomach. Right shoulder arthroplasty. IMPRESSION: Stable bilateral pleural effusions, left greater than right. No frank interstitial edema. Stable support apparatus as above. Electronically Signed   By: Julian Hy M.D.   On: 06/16/2020 09:39     Medications:     Scheduled Medications: . acetaminophen  650 mg Oral TID  . ALPRAZolam  0.25 mg Oral QHS  . antiseptic oral rinse   15 mL Mouth Rinse TID  . aspirin EC  81 mg Oral Daily  . Chlorhexidine Gluconate Cloth  6 each Topical Daily  . docusate sodium  200 mg Oral Daily  . feeding supplement  237 mL Oral TID BM  . feeding supplement (OSMOLITE 1.5 CAL)  1,000 mL Per Tube Q24H  . feeding supplement (PROSource TF)  45 mL Per Tube BID  . ferrous [id number] C-folic acid  1 capsule Oral BID PC  . insulin aspart  0-15 Units Subcutaneous Q4H  . mouth rinse  15 mL Mouth Rinse BID  . midodrine  10 mg Oral TID  . mirtazapine  7.5 mg Oral QHS  . multivitamin with minerals  1 tablet Oral Daily  . pantoprazole  40 mg Oral Daily  . senna  1 tablet Oral Daily  . tamsulosin  0.4 mg Oral Daily    Infusions: . sodium chloride Stopped (06/08/20 0941)    PRN Medications: sodium chloride, bisacodyl, Gerhardt's butt cream, melatonin, ondansetron (ZOFRAN) IV, sodium chloride flush, traMADol   Assessment/Plan   1. Severe 3V CAD: - s/p CABGx 4 (LIMA-LAD, sequential SVG-OM1 and OM2, SVG- PDA) - No s/s ischemia - continue ASA 81 - no ? blocker w/ post-cardiotomy shock and low BP - Atorva started 12/7 and stopped 12/8. Doubt this is contributing to his weakness but ok to hold   2. Aortic Stenosis: - severe low flow low gradient AS - s/p tissue AVR 11/26  - AV prosthesis ok on post op echo - stable  3. Acute on Chronic Biventricular Heart Failure:  - Echo in 2014 showed normal LVEF 55-60% w/ G1DD.  - Echo 10/21 and EF 30-35% w/ global HK. RV systolic function mildly reduced. - LHC this admit w/ severe 3V CAD>>ICM. Now s/p CABG. Also w/ severe AS, possibly contributing to LV dysfunction, now s/p SAVR.  - post op recovery c/b difficulties weaning pressors/inotropes.  - Post-op echo 12/1 EF 30-35%, RV mildly reduced. AV prothesis ok - off NE - Dobutamine stopped 12/18.  Co-ox stable at 65% - Weight trending down off diuretics but CVP still ~10. Start PO Lasix 40 daily if SCr ok (BMP pending) - On midodrine 10  tid for BP support. Will continue - Off digoxin with ? heart block.  - Continue LE compression    4. Post Operative Atrial Fibrillation:   - Remains in NSR - defer anticoagulation to CT surgery    5. AKI:   - Baseline <1.0.  - BMP pending   6. Hyponatremia - resolved   7. Anemia - expected ABLA from surgery  - on PO Fe  - Hgb has been stable, 9.3 yesterday   8. Debility - Major issue currently. Continue aggressive PT/OT - Recommending SNF.   9. Urinary Retention - Prior to admit I&O cath at home.  - Continues with I&O cath  every 6 hours.   10. PNA  - CXR w/ bilateral infiltrates, PCT 0.26  - completed Vanc + cefepime x 7 days - resolved  11. Rhythm - insinus with long 1AVB - Digoxin discontinued.   12. Constipation  - On senna. Can give sorbitol as needed  13. Insomnia/anxiety/depression  - appreciate Palliative Care's recommendations - ok to proceed with stimulant if felt he needs it    Lyda Jester, PA-C  06/16/2020 10:15 AM   Patient seen and examined with the above-signed Advanced Practice Provider and/or Housestaff. I personally reviewed laboratory data, imaging studies and relevant notes. I independently examined the patient and formulated the important aspects of the plan. I have edited the note to reflect any of my changes or salient points. I have personally discussed the plan with the patient and/or family.  Says he didn't sleep well last night. Denies CP or SOB. Feels weak but able to ambulate halls. Co-ox ok. CVP up slightly  General:  Elderly male No resp difficulty HEENT: normal Neck: supple. JVP 8-9 Carotids 2+ bilat; no bruits. No lymphadenopathy or thryomegaly appreciated. Cor: PMI nondisplaced. Regular rate & rhythm. No rubs, gallops or murmurs. Lungs: clear Abdomen: soft, nontender, nondistended. No hepatosplenomegaly. No bruits or masses. Good bowel sounds. Extremities: no cyanosis, clubbing, rash, edema Neuro: alert &  orientedx3, cranial nerves grossly intact. moves all 4 extremities w/o difficulty. Affect pleasant   Continues to improved slowly. Co-ox stable off inotropes. Agree with low-dose lasix. Appreciate Palliative Care recs. Hopefully will be ready for SNF by the end of the week. Remove UNNA. Place TEDs.   Glori Bickers, MD  6:45 PM

## 2020-06-16 NOTE — Progress Notes (Signed)
CARDIAC REHAB PHASE I   PRE:  Rate/Rhythm: 55 SB first deg    BP: lying 108/58, sitting 121/70    SaO2: 97 5L, 96 3L  MODE:  Ambulation: 50 ft   POST:  Rate/Rhythm: 68 SR first deg    BP: sitting 112/67     SaO2: 90 then 94 3L   Pt grunting/SOB in bed. Sts he is uncomfortable with his breathing. Mod assist to sit up, esp moving hips to EOB. Stood with min-mod assist x2 with gait belt, posterior lean, and moved to [facility] for large BM. Ambulated in hall with RW, 3L, assist x2 with gait belt. Pt struggled with steering, fatigued and SOB with distance, anxious at times. Rest x2 standing. To recliner in room, exhausted but VSS. Practiced IS, 250-500 mL. Pt requested to lean back in recliner. Congested, encouraged pt coughing, he declined. PT later today. [id number]  [place], ACSM 06/16/2020 11:03 AM

## 2020-06-16 NOTE — Progress Notes (Signed)
Physical Therapy Treatment Patient Details Name: [name] [name] MRN: [id number] DOB: Oct 25, 1927 Today's Date: 06/16/2020    History of Present Illness 86 yo male presenting to PCP with worsening LE edema and dyspnea. S/p right/left heart cath and coronary angiography on 11/22. S/p CABG x4 on 11/26. PMH including venous insufficiency with chronic lower extremity edema (mostly on the left), hypertension, hyperlipidemia, peripheral neuropathy, Bil TKA,  essential tremor, and prostate cancer.    PT Comments    Patient progressing slowly towards PT goals. Pt discouraged about progress due to unrealistic expectations about recovery timeline. Pt also upset that he cannot sleep or eat, "I know what I need to do to get out of here but I just can't." Provided a listening ear and some education addressing above concerns. Requires Mod A to stand from low chair with cues for use of momentum and anterior weight shift as pt has a tendency to lean posteriorly during the transition to standing. Noted to require less standing rest breaks today during gait training with 3/4 DOE, VSS. Not able to get Sp02 reading due to poor wave form. Pt responded well to encouragement today. Continue to recommend SNF. Will follow and progress as able as pt highly motivated.   Follow Up Recommendations  SNF     Equipment Recommendations  None recommended by PT    Recommendations for Other Services       Precautions / Restrictions Precautions Precautions: Fall;Sternal Precaution Booklet Issued: No Precaution Comments: Reviewed sternal precautions, pt not able to recall Restrictions Weight Bearing Restrictions: Yes Other Position/Activity Restrictions: sternal    Mobility  Bed Mobility Overal bed mobility: Needs Assistance Bed Mobility: Sit to Sidelying         Sit to sidelying: Mod assist;HOB elevated General bed mobility comments: Sitting in chair upon PT arrival. Assist to bring LEs into bed to return to  supine.  Transfers Overall transfer level: Needs assistance Equipment used: Rolling walker (2 wheeled) Transfers: Sit to/from Stand Sit to Stand: Mod assist         General transfer comment: Assist to power to standing with cues for use of momentum and anterior weight shift; posterior bias/tendency.  Ambulation/Gait Ambulation/Gait assistance: Min assist Gait Distance (Feet): 295 Feet Assistive device: Rolling walker (2 wheeled) Gait Pattern/deviations: Step-through pattern;Trunk flexed;Decreased stride length;Wide base of support;Drifts right/left Gait velocity: decreased   General Gait Details: Slow, mildly unsteady gait with a few standing rest breaks (~3) and assist for balance; 3/4 DOE. Not able to read SP02 during activity. RR up to 38.   Stairs             Wheelchair Mobility    Modified Rankin (Stroke Patients Only)       Balance Overall balance assessment: Needs assistance Sitting-balance support: No upper extremity supported;Feet supported Sitting balance-Leahy Scale: Fair Sitting balance - Comments: Supervision for safety.   Standing balance support: During functional activity Standing balance-Leahy Scale: Poor Standing balance comment: Requires UE support in standing.                            Cognition Arousal/Alertness: Awake/alert Behavior During Therapy: WFL for tasks assessed/performed;Anxious Overall Cognitive Status: Within Functional Limits for tasks assessed                                 General Comments: Pt with unrealistic expectations/timeline for recovery. Discussed taking it  day by day; pt anxious. Does well with reassurance and stating realistic expectations.      Exercises      General Comments General comments (skin integrity, edema, etc.): Not able to get SP02 reading, poor wave form. BP pre activity 127/84, post activity 144/92.      Pertinent Vitals/Pain Pain Assessment: No/denies pain     Home Living                      Prior Function            PT Goals (current goals can now be found in the care plan section) Progress towards PT goals: Progressing toward goals    Frequency    Min 2X/week      PT Plan Current plan remains appropriate    Co-evaluation              AM-PAC PT "6 Clicks" Mobility   Outcome Measure  Help needed turning from your back to your side while in a flat bed without using bedrails?: A Little Help needed moving from lying on your back to sitting on the side of a flat bed without using bedrails?: A Lot Help needed moving to and from a bed to a chair (including a wheelchair)?: A Lot Help needed standing up from a chair using your arms (e.g., wheelchair or bedside chair)?: A Lot Help needed to walk in hospital room?: A Little Help needed climbing 3-5 steps with a railing? : A Lot 6 Click Score: 14    End of Session Equipment Utilized During Treatment: Gait belt Activity Tolerance: Patient limited by fatigue Patient left: in bed;with call bell/phone within reach;with bed alarm set Nurse Communication: Mobility status PT Visit Diagnosis: Muscle weakness (generalized) (M62.81);Difficulty in walking, not elsewhere classified (R26.2);Other abnormalities of gait and mobility (R26.89)     Time: 9242-6834 PT Time Calculation (min) (ACUTE ONLY): 34 min  Charges:  $Gait Training: 8-22 mins $Therapeutic Activity: 8-22 mins                     Marisa Severin, PT, DPT Acute Rehabilitation Services Pager [phone] Office [place] 06/16/2020, 3:43 PM

## 2020-06-16 NOTE — Plan of Care (Signed)
  Problem: Education: Goal: Knowledge of disease and its progression will improve Outcome: Progressing   Problem: Fluid Volume: Goal: Compliance with measures to maintain balanced fluid volume will improve Outcome: Progressing   Problem: Education: Goal: Knowledge of General Education information will improve Description: Including pain rating scale, medication(s)/side effects and non-pharmacologic comfort measures Outcome: Progressing   Problem: Health Behavior/Discharge Planning: Goal: Ability to manage health-related needs will improve Outcome: Progressing   Problem: Clinical Measurements: Goal: Ability to maintain clinical measurements within normal limits will improve Outcome: Progressing Goal: Will remain free from infection Outcome: Progressing Goal: Diagnostic test results will improve Outcome: Progressing Goal: Respiratory complications will improve Outcome: Progressing Goal: Cardiovascular complication will be avoided Outcome: Progressing   Problem: Activity: Goal: Risk for activity intolerance will decrease Outcome: Progressing   Problem: Nutrition: Goal: Adequate nutrition will be maintained Outcome: Progressing   Problem: Coping: Goal: Level of anxiety will decrease Outcome: Progressing   Problem: Elimination: Goal: Will not experience complications related to bowel motility Outcome: Progressing Goal: Will not experience complications related to urinary retention Outcome: Progressing   Problem: Skin Integrity: Goal: Risk for impaired skin integrity will decrease Outcome: Progressing   Problem: Education: Goal: Ability to demonstrate management of disease process will improve Outcome: Progressing Goal: Ability to verbalize understanding of medication therapies will improve Outcome: Progressing Goal: Individualized Educational Video(s) Outcome: Progressing   Problem: Activity: Goal: Capacity to carry out activities will improve Outcome:  Progressing   Problem: Cardiac: Goal: Ability to achieve and maintain adequate cardiopulmonary perfusion will improve Outcome: Progressing   Problem: Education: Goal: Will demonstrate proper wound care and an understanding of methods to prevent future damage Outcome: Progressing Goal: Knowledge of disease or condition will improve Outcome: Progressing Goal: Knowledge of the prescribed therapeutic regimen will improve Outcome: Progressing Goal: Individualized Educational Video(s) Outcome: Progressing   Problem: Activity: Goal: Risk for activity intolerance will decrease Outcome: Progressing   Problem: Cardiac: Goal: Will achieve and/or maintain hemodynamic stability Outcome: Progressing   Problem: Clinical Measurements: Goal: Postoperative complications will be avoided or minimized Outcome: Progressing   Problem: Respiratory: Goal: Respiratory status will improve Outcome: Progressing   Problem: Skin Integrity: Goal: Wound healing without signs and symptoms of infection Outcome: Progressing Goal: Risk for impaired skin integrity will decrease Outcome: Progressing   Problem: Urinary Elimination: Goal: Ability to achieve and maintain adequate renal perfusion and functioning will improve Outcome: Progressing

## 2020-06-16 NOTE — Progress Notes (Signed)
Daily Progress Note   Patient Name: [name] [name]       Date: 06/16/2020 DOB: [birth year]  Age: 86 y.o. MRN#: [id number] Attending Physician: [name] Pollack, MD Primary Care Physician: [name] Fess, MD Admit Date: 05/16/2020  Reason for Consultation/Follow-up: Non pain symptom management, Pain control and Psychosocial/spiritual support    Patient sitting up in recliner Color in his face is good. Had a good bowel movement.  Subjective: Patient complaining that he is going backwards.  States he didn't sleep because he was uncomfortable and his breathing is worse.  "I can't even open this straw! That's something a 86 yo could do!". Very frustrated by what he perceives as a slow recovery.  I attempted to encourage him by pointing out a few things that were going very well.  Assessment: 86 yo gentleman several weeks post op from CABG x 4 and AVR.  He is dyspneic when he speaks but otherwise very stable.  He has high expectations and a strong spirit.   Patient Profile/HPI:  86 y.o.malewith past medical history of prostate cancer with obstructive uropathy, peripheral neuropathy, colitis, CAD, AS, CHFwho was admitted on 11/19/2021with acute on chronic heart failure exacerbation. He was found to have severe 3V CAD and aortic stenosis. He underwent CABG x 4 and AVR on 05/23/2020. Post op echocardiogram 12/1 shows LVEF of 30 - 35 % and moderate to severe mitral valve regurgitation with an avg HR of 124.  PMT was asked for recommendations regarding symptoms of poor appetite, depression, and insomnia.    Length of Stay: 31   Vital Signs: BP (!) 92/54 (BP Location: Left Arm)   Pulse (!) 54   Temp 97.9 F (36.6 C) (Oral)   Resp (!) 21   Ht 5\' 9"  (1.753 m)   Wt 78 kg   SpO2 92%   BMI  25.39 kg/m  SpO2: SpO2: 92 % O2 Device: O2 Device: Nasal Cannula O2 Flow Rate: O2 Flow Rate (L/min): 3.5 L/min       Palliative Assessment/Data: 50%     Palliative Care Plan    Recommendations/Plan:  Continue current care with Remeron and low dose xanax QHS.  Will add very low dose oxy IR for discomfort at night.  May also help with dyspnea.  Closely monitor bowels while on opioid.  Still considering low  dose ritalin for daytime - but am worried about adverse effects (nervousness/agitation).  Will call [name] [name] to discuss medication changes.   Discharge Planning:  [name] for rehab with Palliative care service follow-up    Thank you for allowing the Palliative Medicine Team to assist in the care of this patient.  Total time spent:  35 min.     Greater than 50%  of this time was spent counseling and coordinating care related to the above assessment and plan.  [name] Jenny, PA-C Palliative Medicine  Please contact Palliative MedicineTeam phone at [phone] for questions and concerns between 7 am - 7 pm.   Please see AMION for individual provider pager numbers.

## 2020-06-16 NOTE — Progress Notes (Addendum)
[address]       [place] [address]             [phone]        24 Days Post-Op Procedure(s) (LRB): AORTIC VALVE REPLACEMENT (AVR) USING EDWARDS RESILIA 29 MM AORTIC VALVE. (N/A) CORONARY ARTERY BYPASS GRAFTING (CABG) USING LIMA to LAD; ENDOSCOPIC HARVESTED RIGHT GREATER SAPHENOUS VEIN: SVG to PD; SVG to SEQUENCED INTERMEDIATE to OM (N/A) TRANSESOPHAGEAL ECHOCARDIOGRAM (TEE) (N/A) ENDOVEIN HARVEST OF GREATER SAPHENOUS VEIN (Right)  Subjective: Patient states he cannot get comfortable in the bed so he did not sleep much and his breathing   Objective: Vital signs in last 24 hours: Temp:  [97.8 F (36.6 C)-98.7 F (37.1 C)] 97.9 F (36.6 C) (12/20 0311) Pulse Rate:  [71-84] 76 (12/20 0311) Cardiac Rhythm: Junctional rhythm (12/20 0726) Resp:  [16-23] 20 (12/20 0311) BP: (104-131)/(72-89) 104/76 (12/20 0311) SpO2:  [94 %-96 %] 96 % (12/20 0311) Weight:  [78 kg] 78 kg (12/20 0311)  Pre op weight 75.5 kg Current Weight  06/16/20 78 kg    Hemodynamic parameters for last 24 hours: CVP:  [7 mmHg-11 mmHg] 9 mmHg  Intake/Output from previous day: 12/19 0701 - 12/20 0700 In: 1910 [P.O.:40; NG/GT:1870] Out: 2000 [Urine:2000]   Physical Exam:  Cardiovascular: Slightly bradycardia Pulmonary: Diminished left basilar breath sounds Abdomen: Soft, non tender, bowel sounds present. Extremities: LEs wrapped Wounds: Clean and dry.  No erythema or signs of infection.  Lab Results: CBC: Recent Labs    06/15/20 0345  WBC 7.1  HGB 9.3*  HCT 30.8*  PLT 157   BMET:  Recent Labs    06/14/20 0410 06/15/20 0345  NA 139 139  K 4.5 4.5  CL 103 105  CO2 27 27  GLUCOSE 151* 155*  BUN 38* 38*  CREATININE 1.23 1.14  CALCIUM 8.5* 8.6*    PT/INR:  Lab Results  Component Value Date   INR 1.3 (H) 05/23/2020   INR 1.6 (H) 05/23/2020   INR 1.1 05/22/2020   ABG:  INR: Will add last result for INR, ABG once components are confirmed Will add last  4 CBG results once components are confirmed  Assessment/Plan:  1. CV - Junctional, first degree heart block. HR in the 50's this am. On Midodrine 10 mg tid. Co ox this am decreased to 64.7 has been off Dobutamine since 12/18.  2.  Pulmonary - On 3 liters of oxygen via [place]. Wean as able. Check CXR (previous small to moderate left pleural effusion and treated for previous PNA) . Will discuss with Dr. Cyndia Bent. Encourage incentive spirometer. 3. Acute on chronic biventricular heart failure-diuretic per heart failure 4. Expected post op blood loss anemia-H and H yesterday stable at 9.3 and 30.8. Continue Trinsicon 5. CBGs 112/141/132. Pre op HGA1C 6.2. 6. GI-encourage po. Has Cortrak 7. GU-History of severe BPD. On Flomax. Patient requesting standing order for I&O cath as did so prior to admission so ordered.  Donielle M ZimmermanPA-C 06/16/2020,7:42 AM    Chart reviewed, patient examined, agree with above. He says he was not out of bed to chair yesterday and did not walk. The most important thing for him at this time is mobilization and it is not happening where he is. Will transfer to 4E. Continue PT/OT, IS, OOB and ambulation hopefully with the mobility team. He complains a lot of not sleeping and being uncomfortable and if he continues to lie in bed all day he will not  recover.

## 2020-06-16 NOTE — Progress Notes (Signed)
Pt transferred to 4E rm 4.  Belongings with him at bedside.  Will notify wife.

## 2020-06-16 NOTE — Progress Notes (Signed)
Sacral wound dressing change per order.

## 2020-06-17 DIAGNOSIS — Z515 Encounter for palliative care: Secondary | ICD-10-CM | POA: Diagnosis not present

## 2020-06-17 DIAGNOSIS — E440 Moderate protein-calorie malnutrition: Secondary | ICD-10-CM | POA: Diagnosis not present

## 2020-06-17 LAB — BASIC METABOLIC PANEL
Anion gap: 8 (ref 5–15)
BUN: 34 mg/dL — ABNORMAL HIGH (ref 8–23)
CO2: 28 mmol/L (ref 22–32)
Calcium: 8.7 mg/dL — ABNORMAL LOW (ref 8.9–10.3)
Chloride: 105 mmol/L (ref 98–111)
Creatinine, Ser: 1.17 mg/dL (ref 0.61–1.24)
GFR, Estimated: 59 mL/min — ABNORMAL LOW (ref >60)
Glucose, Bld: 132 mg/dL — ABNORMAL HIGH (ref 70–99)
Potassium: 4.4 mmol/L (ref 3.5–5.1)
Sodium: 141 mmol/L (ref 135–145)

## 2020-06-17 LAB — GLUCOSE, CAPILLARY
Glucose-Capillary: 102 mg/dL — ABNORMAL HIGH (ref 70–99)
Glucose-Capillary: 114 mg/dL — ABNORMAL HIGH (ref 70–99)
Glucose-Capillary: 125 mg/dL — ABNORMAL HIGH (ref 70–99)
Glucose-Capillary: 143 mg/dL — ABNORMAL HIGH (ref 70–99)
Glucose-Capillary: 154 mg/dL — ABNORMAL HIGH (ref 70–99)
Glucose-Capillary: 180 mg/dL — ABNORMAL HIGH (ref 70–99)
Glucose-Capillary: 90 mg/dL (ref 70–99)

## 2020-06-17 MED ORDER — MIDODRINE HCL 5 MG PO TABS
5.0000 mg | ORAL_TABLET | Freq: Three times a day (TID) | ORAL | Status: DC
  Administered 2020-06-17 – 2020-06-26 (×25): 5 mg via ORAL
  Filled 2020-06-17 (×25): qty 1

## 2020-06-17 NOTE — Progress Notes (Signed)
CARDIAC REHAB PHASE I   PRE:  Rate/Rhythm: 80 first deg    BP: sitting 130/80    SaO2: 98 2L  MODE:  Ambulation: 280 ft   POST:  Rate/Rhythm: 96 first deg    BP: sitting 158/86     SaO2: 99 2L,, 96 RA  Pt motivated this am. Needs mod assist to move to EOB, mod assist to stand due to initially unsteadiness (posterior lean). Ambulated with RW, gait belt, assist x2 with 2L. Quicker, stronger pace. Requested rest at halfway, sat for 4 min. Able to walk back to room with x1 standing rest for SOB. To BSC for BM. Able to take O2 off. Bleeding hemorrhoids, had RN look at buttocks/wound. To recliner, brushed teeth. Left off O2 for now, SaO2 93 RA. [id number]  Darrick Meigs CES, ACSM 06/17/2020 10:19 AM

## 2020-06-17 NOTE — Progress Notes (Signed)
Mobility Specialist: Progress Note   06/17/20 1718  Mobility  Activity Ambulated in hall  Level of Assistance Moderate assist, patient does 50-74%  Assistive Device Front wheel walker  Distance Ambulated (ft) 140 ft  Mobility Response Tolerated fair  Mobility performed by Mobility specialist  $Mobility charge 1 Mobility   Pre-Mobility: 87 HR, 125/66 BP, 92% SpO2 During Mobility: 87 HR, 95% SpO2 Post-Mobility: 92 HR, 133/93 BP, 96% SpO2  Pt was notably SOB during ambulation. Pt with chair follow, stopped to take one seated rest break lasting 1-2 minutes due to feeling SOB. RN present in room.   [facility] Valisa Karpel Mobility Specialist

## 2020-06-17 NOTE — Care Management Important Message (Signed)
Important Message  Patient Details  Name: [name] [name] MRN: [id number] Date of Birth: 1927/09/14   Medicare Important Message Given:  Yes     [name] 06/17/2020, 12:39 PM

## 2020-06-17 NOTE — TOC Progression Note (Signed)
Transition of Care [facility]) - Progression Note    Patient Details  Name: [name] [name] MRN: [id number] Date of Birth: Jul 26, 1927  Transition of Care [facility]) CM/SW [place], [place] Phone Number: 06/17/2020, 5:26 PM  Clinical Narrative:      CSW called patients spouse to give additional bed offer of [place]. CSW awaiting call back.  Pending SNF choice.   CSW will continue to follow.   Expected Discharge Plan: [place] Barriers to Discharge: Continued Medical Work up  Expected Discharge Plan and Services Expected Discharge Plan: [place] arrangements for the past 2 months: Single Family Home                                       Social Determinants of Health (SDOH) Interventions    Readmission Risk Interventions No flowsheet data found.

## 2020-06-17 NOTE — Progress Notes (Signed)
Daily Progress Note   Patient Name: [name] [name]       Date: 06/17/2020 DOB: 09-Aug-1927  Age: 86 y.o. MRN#: [id number] Attending Physician: Gaye Pollack, MD Primary Care Physician: [name], MD Admit Date: 05/16/2020  Reason for visit:  Symptom management  Subjective: Patient complains that he cant do anything.  He's too weak.  He's dizzy and nauseated.  I note that he walked 280 ft with cardiac rehab this morning. He tells me everything he's eaten today.  It definitely sounds as though he is eating more. He tells me he slept well last night and had another BM today.  Then he reiterates how badly he is doing.  I talked with him for awhile to encourage him.  Assessment:  Improving.  Recovering from CABG and AVR.   Per wife patient has a history of  not eating well and being fatigued.    Patient Profile/HPI:  86 y.o.malewith past medical history of prostate cancer with obstructive uropathy, peripheral neuropathy, colitis, CAD, AS, CHFwho was admitted on 11/19/2021with acute on chronic heart failure exacerbation. He was found to have severe 3V CAD and aortic stenosis. He underwent CABG x 4 and AVR on 05/23/2020. Post op echocardiogram 12/1 shows LVEF of 30 - 35 % and moderate to severe mitral valve regurgitation with an avg HR of 124.  PMT was asked for recommendations regarding symptoms of poor appetite, depression, and insomnia.    Length of Stay: 32   Vital Signs: BP 108/73 (BP Location: Left Arm)   Pulse 81   Temp 98.2 F (36.8 C) (Oral)   Resp 20   Ht 5\' 9"  (1.753 m)   Wt 77.3 kg   SpO2 95%   BMI 25.17 kg/m  SpO2: SpO2: 95 % O2 Device: O2 Device: Nasal Cannula O2 Flow Rate: O2 Flow Rate (L/min): 3.5 L/min       Palliative Assessment/Data:  50%     Palliative Care Plan    Recommendations/Plan:  Patient appears to have steady improvement.  PMT will follow from a distance. Please call us back if we can be of assistance.  Would he be a good candidate for [facility] Inpatient Rehab?  Recommend Outpatient Palliative to check in after discharge.   Discharge Planning:  [place] for rehab with Palliative care service follow-up  Care plan was discussed with patient  Thank you for allowing the Palliative Medicine Team to assist in the care of this patient.  Total time spent:  15 min.     Greater than 50%  of this time was spent counseling and coordinating care related to the above assessment and plan.  Florentina Jenny, PA-C Palliative Medicine  Please contact Palliative MedicineTeam phone at [phone] for questions and concerns between 7 am - 7 pm.   Please see AMION for individual provider pager numbers.

## 2020-06-17 NOTE — Progress Notes (Addendum)
Patient ID: [name] [name], male   DOB: 1927-06-29, 86 y.o.   MRN: [id number]     Advanced Heart Failure Rounding Note  PCP-Cardiologist: Peter Martinique, MD   Subjective:    Wt continues to trend down and nearing pre-op wt, 170 lb today.   BP running higher. SBPs persistently >110 + supine hypertension into the 150s. Primary team weaning down midodrine, down from 10>>5 tid.   Scr stable 1.17. K 4.4   Appetite improving. Sleep is better. Working w/ PT    Objective:   Weight Range: 77.3 kg Body mass index is 25.17 kg/m.   Vital Signs:   Temp:  [97.8 F (36.6 C)-98.5 F (36.9 C)] 98.2 F (36.8 C) (12/21 1130) Pulse Rate:  [68-81] 81 (12/21 1130) Resp:  [18-22] 20 (12/21 1130) BP: (108-131)/(67-84) 108/73 (12/21 1130) SpO2:  [95 %-98 %] 95 % (12/21 1130) Weight:  [77.3 kg] 77.3 kg (12/21 0537) Last BM Date: 06/17/20  Weight change: Filed Weights   06/14/20 0526 06/16/20 0311 06/17/20 0537  Weight: 79.9 kg 78 kg 77.3 kg    Intake/Output:   Intake/Output Summary (Last 24 hours) at 06/17/2020 1240 Last data filed at 06/17/2020 1037 Gross per 24 hour  Intake 960 ml  Output 1900 ml  Net -940 ml      Physical Exam   PHYSICAL EXAM: General:  fatigue appearing. No respiratory difficulty HEENT: normal Neck: supple. JVD ~ 8 cm. Carotids 2+ bilat; no bruits. No lymphadenopathy or thyromegaly appreciated. Cor: PMI nondisplaced. Regular rate & rhythm. No rubs, gallops or murmurs. Lungs: clear Abdomen: soft, nontender, nondistended. No hepatosplenomegaly. No bruits or masses. Good bowel sounds. Extremities: no cyanosis, clubbing, rash, trace bilateral LE edema Neuro: alert & oriented x 3, cranial nerves grossly intact. moves all 4 extremities w/o difficulty. Affect pleasant.    Telemetry   NSR w/ prolonged 1st degree AVB   Labs    CBC Recent Labs    06/15/20 0345  WBC 7.1  HGB 9.3*  HCT 30.8*  MCV 100.0  PLT 185   Basic Metabolic Panel Recent Labs     06/16/20 1210 06/17/20 0445  NA 141 141  K 5.0 4.4  CL 107 105  CO2 26 28  GLUCOSE 96 132*  BUN 38* 34*  CREATININE 1.19 1.17  CALCIUM 8.7* 8.7*   Liver Function Tests No results for input(s): AST, ALT, ALKPHOS, BILITOT, PROT, ALBUMIN in the last 72 hours. No results for input(s): LIPASE, AMYLASE in the last 72 hours. Cardiac Enzymes No results for input(s): CKTOTAL, CKMB, CKMBINDEX, TROPONINI in the last 72 hours.  BNP: BNP (last 3 results) Recent Labs    11/19/19 1550 05/16/20 1736  BNP 1,139.7* 2,005.2*    ProBNP (last 3 results) Recent Labs    01/02/20 1134  PROBNP 1,167.0*     D-Dimer No results for input(s): DDIMER in the last 72 hours. Hemoglobin A1C No results for input(s): HGBA1C in the last 72 hours. Fasting Lipid Panel No results for input(s): CHOL, HDL, LDLCALC, TRIG, CHOLHDL, LDLDIRECT in the last 72 hours. Thyroid Function Tests No results for input(s): TSH, T4TOTAL, T3FREE, THYROIDAB in the last 72 hours.  Invalid input(s): FREET3  Other results:   Imaging    No results found.   Medications:     Scheduled Medications: . acetaminophen  650 mg Oral TID  . ALPRAZolam  0.25 mg Oral QHS  . antiseptic oral rinse  15 mL Mouth Rinse TID  . aspirin EC  81 mg Oral  Daily  . Chlorhexidine Gluconate Cloth  6 each Topical Daily  . docusate sodium  200 mg Oral Daily  . feeding supplement  237 mL Oral TID BM  . feeding supplement (OSMOLITE 1.5 CAL)  1,000 mL Per Tube Q24H  . feeding supplement (PROSource TF)  45 mL Per Tube BID  . ferrous [id number] C-folic acid  1 capsule Oral BID PC  . furosemide  40 mg Oral Daily  . insulin aspart  0-15 Units Subcutaneous Q4H  . mouth rinse  15 mL Mouth Rinse BID  . midodrine  10 mg Oral TID  . mirtazapine  7.5 mg Oral QHS  . multivitamin with minerals  1 tablet Oral Daily  . pantoprazole  40 mg Oral Daily  . senna  1 tablet Oral Daily  . tamsulosin  0.4 mg Oral Daily    Infusions: . sodium  chloride Stopped (06/08/20 0941)    PRN Medications: sodium chloride, bisacodyl, Gerhardt's butt cream, melatonin, ondansetron (ZOFRAN) IV, oxyCODONE, sodium chloride flush, traMADol   Assessment/Plan   1. Severe 3V CAD: - s/p CABGx 4 (LIMA-LAD, sequential SVG-OM1 and OM2, SVG- PDA) - No s/s ischemia - continue ASA 81 - no ? blocker w/ post-cardiotomy shock and low BP - Atorva started 12/7 and stopped 12/8. Doubt this is contributing to his weakness but ok to hold   2. Aortic Stenosis: - severe low flow low gradient AS - s/p tissue AVR 11/26  - AV prosthesis ok on post op echo - stable  3. Acute on Chronic Biventricular Heart Failure:  - Echo in 2014 showed normal LVEF 55-60% w/ G1DD.  - Echo 10/21 and EF 30-35% w/ global HK. RV systolic function mildly reduced. - LHC this admit w/ severe 3V CAD>>ICM. Now s/p CABG. Also w/ severe AS, possibly contributing to LV dysfunction, now s/p SAVR.  - post op recovery c/b difficulties weaning pressors/inotropes.  - Post-op echo 12/1 EF 30-35%, RV mildly reduced. AV prothesis ok - off NE - Dobutamine stopped 12/18.  Co-ox stable at 65% on 12/20  - Volume status much improved, now nearing pre-op wt. Continue Lasix 40 mg daily  - Will wean down midodrine but will need to watch BP/renal function, reduce to 5 mg tid  - Off digoxin with ? heart block.  - Continue LE compression    4. Post Operative Atrial Fibrillation:   - Remains in NSR - defer anticoagulation to CT surgery    5. AKI:   - Baseline <1.0.  - resolved, SCr 1.17 today   6. Hyponatremia - resolved  - Na 141 today   7. Anemia - expected ABLA from surgery  - on PO Fe  - Hgb has been stable ~9    8. Debility - Major issue currently. Continue aggressive PT/OT - Recommending SNF.   9. Urinary Retention - Prior to admit I&O cath at home.  - Continues with I&O cath every 6 hours.   10. PNA  - CXR w/ bilateral infiltrates, PCT 0.26  - completed Vanc + cefepime x  7 days - resolved  11. Rhythm - insinus with long 1AVB - Digoxin discontinued.   12. Constipation  - On senna. Can give sorbitol as needed  13. Insomnia/anxiety/depression  - Palliative care team has adjusted meds. Sleep improved. Appreciate PC recommendations    [name] [name]  06/17/2020 12:40 PM   Patient seen and examined with the above-signed Advanced Practice Provider and/or Housestaff. I personally reviewed laboratory data, imaging studies and relevant  notes. I independently examined the patient and formulated the important aspects of the plan. I have edited the note to reflect any of my changes or salient points. I have personally discussed the plan with the patient and/or family.  He continues to improve. Eating more. Co-ox stable yesterday. Volume ok  General:  Elderly No resp difficulty HEENT: normal Neck: supple. no JVD. Carotids 2+ bilat; no bruits. No lymphadenopathy or thryomegaly appreciated. Cor: PMI nondisplaced. Regular rate & rhythm. No rubs, gallops or murmurs. Lungs: clear Abdomen: soft, nontender, nondistended. No hepatosplenomegaly. No bruits or masses. Good bowel sounds. Extremities: no cyanosis, clubbing, rash, edema Neuro: alert & orientedx3, cranial nerves grossly intact. moves all 4 extremities w/o difficulty. Affect pleasant  Doing well. Agree with dropping midodrine. Be careful not to overdiurese. Hopefully ready for SNF soon.   [name] Bickers, MD  1:56 PM

## 2020-06-17 NOTE — Consult Note (Signed)
[facility] CM Inpatient Consult   06/17/2020  [name] [name] 04/30/1928 [id number]  [facility] [[facility]]  Accountable Care Organization [ACO] Patient: [employer]   Patient screened for length of stay hospitalization and transition to 4E from 2C noted.  Reviewed inpatient Transition of Care team notes to check if potential [facility]  [[facility]] Care Management service needs.  Review of patient's medical record reveals patient is for a skilled nursing facility level of care, PT/OT and  LCSW notes reviewed for transition of care needs.  Plan:  Continue to follow up with inpatient TOC team and disposition when finalized. If going to a skilled nursing facility then transition of care needs can be met at that level of care.   For questions contact:    , RN BSN CCM [facility] Hospital Liaison  [phone] business mobile phone Toll free office [phone]  Fax number: [phone] [email or web address]       

## 2020-06-17 NOTE — Progress Notes (Signed)
25 Days Post-Op Procedure(s) (LRB): AORTIC VALVE REPLACEMENT (AVR) USING EDWARDS RESILIA 29 MM AORTIC VALVE. (N/A) CORONARY ARTERY BYPASS GRAFTING (CABG) USING LIMA to LAD; ENDOSCOPIC HARVESTED RIGHT GREATER SAPHENOUS VEIN: SVG to PD; SVG to SEQUENCED INTERMEDIATE to OM (N/A) TRANSESOPHAGEAL ECHOCARDIOGRAM (TEE) (N/A) ENDOVEIN HARVEST OF GREATER SAPHENOUS VEIN (Right) Subjective: Slept well. Ambulated this am with cardiac rehab and did well. Ate better for breakfast.   Objective: Vital signs in last 24 hours: Temp:  [97.8 F (36.6 C)-98.5 F (36.9 C)] 98.1 F (36.7 C) (12/21 0300) Pulse Rate:  [54-81] 68 (12/21 0537) Cardiac Rhythm: Normal sinus rhythm;Heart block;Bundle branch block (12/21 0724) Resp:  [18-22] 18 (12/21 0537) BP: (92-131)/(54-84) 118/83 (12/21 0300) SpO2:  [92 %-98 %] 95 % (12/21 0537) Weight:  [77.3 kg] 77.3 kg (12/21 0537)  Hemodynamic parameters for last 24 hours:    Intake/Output from previous day: 12/20 0701 - 12/21 0700 In: 840 [P.O.:840] Out: 3000 [Urine:3000] Intake/Output this shift: No intake/output data recorded.  General appearance: alert, cooperative and more interactive this am. Neurologic: intact Heart: regular rate and rhythm, S1, S2 normal, no murmur, click, rub or gallop Lungs: diminished breath sounds bibasilar Extremities: mild edema, Unna boots on legs Wound: chest incision ok  Lab Results: Recent Labs    06/15/20 0345  WBC 7.1  HGB 9.3*  HCT 30.8*  PLT 157   BMET:  Recent Labs    06/16/20 1210 06/17/20 0445  NA 141 141  K 5.0 4.4  CL 107 105  CO2 26 28  GLUCOSE 96 132*  BUN 38* 34*  CREATININE 1.19 1.17  CALCIUM 8.7* 8.7*    PT/INR: No results for input(s): LABPROT, INR in the last 72 hours. ABG    Component Value Date/Time   PHART 7.381 05/26/2020 1750   HCO3 22.2 05/26/2020 1750   TCO2 23 05/26/2020 1750   ACIDBASEDEF 2.0 05/26/2020 1750   O2SAT 64.7 06/16/2020 0320   CBG (last 3)  Recent Labs     06/17/20 0023 06/17/20 0454 06/17/20 0809  GLUCAP 154* 114* 143*    Assessment/Plan: S/P Procedure(s) (LRB): AORTIC VALVE REPLACEMENT (AVR) USING EDWARDS RESILIA 29 MM AORTIC VALVE. (N/A) CORONARY ARTERY BYPASS GRAFTING (CABG) USING LIMA to LAD; ENDOSCOPIC HARVESTED RIGHT GREATER SAPHENOUS VEIN: SVG to PD; SVG to SEQUENCED INTERMEDIATE to OM (N/A) TRANSESOPHAGEAL ECHOCARDIOGRAM (TEE) (N/A) ENDOVEIN HARVEST OF GREATER SAPHENOUS VEIN (Right)  POD 25  Hemodynamically stable in sinus rhythm. Remains on midodrine 10 tid. Will decrease to 5 tid for now since BP has been consistently >110.   Good urine output with daily lasix and wt slowly trending down. Creat stable.  In better spirits and eating better after a good nights sleep. Seems Remeron may be working for him. Will see how he eats today and then consider removing feeding tube which would make him feel better.  Continue IS, ambulation with cardiac rehab, mobility team, PT.  Hopefully if he continues to progress he could get to SNF next week.     LOS: 32 days    Gaye Pollack 06/17/2020

## 2020-06-17 NOTE — Progress Notes (Signed)
Mobility Specialist: Progress Note   06/17/20 1324  Mobility  Activity Refused mobility   Pt said he doesn't have any energy and wants to wait for his lunch to arrive. Will f/u.   [facility] [place] Mobility Specialist

## 2020-06-17 NOTE — Progress Notes (Signed)
Occupational Therapy Treatment Patient Details Name: [name] [name] MRN: [id number] DOB: 1928-02-06 Today's Date: 06/17/2020    History of present illness 86 yo male presenting to PCP with worsening LE edema and dyspnea. S/p right/left heart cath and coronary angiography on 11/22. S/p CABG x4 on 11/26. PMH including venous insufficiency with chronic lower extremity edema (mostly on the left), hypertension, hyperlipidemia, peripheral neuropathy, Bil TKA,  essential tremor, and prostate cancer.   OT comments  Patient continues to make steady progress towards goals in skilled OT session. Patient's session encompassed functional mobility and toileting in order to increase overall activity tolerance and endurance. Pt continues to require emotional support as far as his progress is concerned, often stating "Im going downhill". Pt remains a mod A of one in order to complete sit<>stand and minimal ambulation to the [facility]. (Pt stating dizziness but BP 112/73 in sitting and 108/73 in standing). Pt requesting back to bed after toileting, requiring min A for lower extremities. However, pt continues to require education with regard to sternal precautions, as he remains unable to recall even with cues. Discharge remains appropriate, therapy will continue to follow.   Follow Up Recommendations  SNF    Equipment Recommendations  3 in 1 bedside commode;Wheelchair (measurements OT);Wheelchair cushion (measurements OT)    Recommendations for Other Services      Precautions / Restrictions Precautions Precautions: Fall;Sternal Precaution Booklet Issued: No Precaution Comments: Reviewed sternal precautions, pt not able to recall Restrictions Weight Bearing Restrictions: No Other Position/Activity Restrictions: sternal       Mobility Bed Mobility               General bed mobility comments: Sitting in chair upon OTA arrival. Assist to bring LEs into bed to return to supine.  Transfers Overall  transfer level: Needs assistance Equipment used: Rolling walker (2 wheeled) Transfers: Sit to/from Stand Sit to Stand: Mod assist Stand pivot transfers: Min assist       General transfer comment: Assist to power to standing with cues for use of momentum and anterior weight shift; posterior bias/tendency.    Balance Overall balance assessment: Needs assistance Sitting-balance support: No upper extremity supported;Feet supported Sitting balance-Leahy Scale: Fair Sitting balance - Comments: Supervision for safety. Postural control: Posterior lean Standing balance support: During functional activity Standing balance-Leahy Scale: Poor Standing balance comment: Requires UE support in standing.                           ADL either performed or assessed with clinical judgement   ADL Overall ADL's : Needs assistance/impaired                         Toilet Transfer: Minimal assistance;Cueing for safety;Cueing for sequencing;Stand-pivot;BSC;RW Toilet Transfer Details (indicate cue type and reason): BCS due to urgency   Toileting - Clothing Manipulation Details (indicate cue type and reason): Max A for peri care after bowel incontience     Functional mobility during ADLs: Minimal assistance;Rolling walker General ADL Comments: Pt continues to present with decreased strength, balance, and activity tolerance. Dizzy in sitting and standing, but no orthostasis noted, required increased encouragement as pt kept stating "im going downhill" and not acknowledging his progress     Vision       Perception     Praxis      Cognition Arousal/Alertness: Awake/alert Behavior During Therapy: WFL for tasks assessed/performed;Anxious Overall Cognitive Status: Within Functional Limits for tasks  assessed                                 General Comments: Pt with unrealistic expectations/timeline for recovery. Discussed taking it day by day; pt anxious. Does well  with reassurance and stating realistic expectations.        Exercises     Shoulder Instructions       General Comments      Pertinent Vitals/ Pain       Pain Assessment: No/denies pain  Home Living                                          Prior Functioning/Environment              Frequency  Min 2X/week        Progress Toward Goals  OT Goals(current goals can now be found in the care plan section)  Progress towards OT goals: Progressing toward goals  Acute Rehab OT Goals Patient Stated Goal: Get my body to do what my mind wants it to do. OT Goal Formulation: With patient Time For Goal Achievement: 06/23/20 Potential to Achieve Goals: Good  Plan Discharge plan remains appropriate    Co-evaluation                 AM-PAC OT "6 Clicks" Daily Activity     Outcome Measure   Help from another person eating meals?: A Little Help from another person taking care of personal grooming?: A Little Help from another person toileting, which includes using toliet, bedpan, or urinal?: A Lot Help from another person bathing (including washing, rinsing, drying)?: A Lot Help from another person to put on and taking off regular upper body clothing?: A Lot Help from another person to put on and taking off regular lower body clothing?: A Lot 6 Click Score: 14    End of Session Equipment Utilized During Treatment: Gait belt;Rolling walker  OT Visit Diagnosis: Unsteadiness on feet (R26.81);Other abnormalities of gait and mobility (R26.89);Muscle weakness (generalized) (M62.81);Pain   Activity Tolerance Patient limited by fatigue   Patient Left with call bell/phone within reach;in bed;with bed alarm set   Nurse Communication Mobility status        Time: 6168-3729 OT Time Calculation (min): 30 min  Charges: OT General Charges $OT Visit: 1 Visit OT Treatments $Self Care/Home Management : 23-37 mins  [place]. Jiayi Lengacher, COTA/L Acute  Rehabilitation Services [place] 06/17/2020, 12:38 PM

## 2020-06-18 ENCOUNTER — Ambulatory Visit: Payer: Self-pay | Admitting: Surgery

## 2020-06-18 LAB — GLUCOSE, CAPILLARY
Glucose-Capillary: 106 mg/dL — ABNORMAL HIGH (ref 70–99)
Glucose-Capillary: 111 mg/dL — ABNORMAL HIGH (ref 70–99)
Glucose-Capillary: 116 mg/dL — ABNORMAL HIGH (ref 70–99)
Glucose-Capillary: 153 mg/dL — ABNORMAL HIGH (ref 70–99)
Glucose-Capillary: 157 mg/dL — ABNORMAL HIGH (ref 70–99)
Glucose-Capillary: 95 mg/dL (ref 70–99)

## 2020-06-18 MED ORDER — INSULIN ASPART 100 UNIT/ML ~~LOC~~ SOLN
0.0000 [IU] | Freq: Three times a day (TID) | SUBCUTANEOUS | Status: DC
  Administered 2020-06-19 – 2020-06-20 (×3): 2 [IU] via SUBCUTANEOUS
  Administered 2020-06-21 – 2020-06-22 (×2): 3 [IU] via SUBCUTANEOUS
  Administered 2020-06-22: 2 [IU] via SUBCUTANEOUS

## 2020-06-18 MED ORDER — SORBITOL 70 % SOLN
30.0000 mL | Freq: Once | Status: AC
  Administered 2020-06-18: 30 mL via ORAL
  Filled 2020-06-18: qty 30

## 2020-06-18 NOTE — TOC Progression Note (Addendum)
Transition of Care [facility]) - Progression Note    Patient Details  Name: [name] [name] MRN: [id number] Date of Birth: Sep 08, 1927  Transition of Care [facility]) CM/SW [place], [place] Phone Number: 06/18/2020, 12:54 PM  Clinical Narrative:     CSW spoke with patient's spouse, Tomi Bamberger. She advised his only  Bed offers are [facility](if willing to pay out of network cost). CSW explained limited SNF options w/[employer].  CSW deferred her to [place]  for questions regarding being in network, cost payment,etc. She will call CSW back after she has talked with SNF.  CSW will continue to follow and assist with discharge planning.  [name], MSW, LCSW Clinical Social Worker   Expected Discharge Plan: Skilled Nursing Facility Barriers to Discharge: Continued Medical Work up  Expected Discharge Plan and Services Expected Discharge Plan: [place] arrangements for the past 2 months: Single Family Home                                       Social Determinants of Health (SDOH) Interventions    Readmission Risk Interventions No flowsheet data found.

## 2020-06-18 NOTE — Progress Notes (Signed)
CARDIAC REHAB PHASE I   PRE:  Rate/Rhythm: 90 SR 1HB  BP:  Supine:   Sitting: 155/83  Standing:    SaO2: 95%RA(  Prongs not in nose) MODE:  Ambulation: 330 ft   POST:  Rate/Rhythm: 93  BP:  Supine:   Sitting: 133/86  Standing:    SaO2: 94%RA halll and 97% in room 1037-1115 Pt walked 330 ft on RA with gait belt use, rolling walker and asst x 2. Pt encouraged to take slow deep breaths. Sat once to rest halfway . We had followed with rollator.. Pt stated he felt lightheaded upon standing to walk in room but seemed to get better with ambulation. Pt tended to stand a little more upright today. Encouraged to keep eyes opened. Sats good on RA in hallway and room so left oxygen off. To recliner after walk with call bell and got pt comfortable. He knows he is to walk later with Mobilitly specialist.    Graylon Good, RN BSN  06/18/2020 11:10 AM

## 2020-06-18 NOTE — Progress Notes (Signed)
Mobility Specialist: Progress Note   06/18/20 1506  Mobility  Activity Ambulated in hall  Level of Assistance Moderate assist, patient does 50-74%  Assistive Device Front wheel walker  Distance Ambulated (ft) 400 ft  Mobility Response Tolerated well  Mobility performed by Mobility specialist  $Mobility charge 1 Mobility   Pre-Mobility: 82 HR, 125/66 BP, 96% SpO2 Post-Mobility: 89 HR, 142/67 BP, 98% SpO2  Pt stopped to take 3 standing rest breaks during ambulation due to feeling SOB. Breaks lasted <30 seconds each. Pt back to bed per request after walk.   [facility] Mobility Specialist

## 2020-06-18 NOTE — TOC Progression Note (Signed)
Transition of Care [facility]) - Progression Note    Patient Details  Name: [name] [name] MRN: [id number] Date of Birth: 30-Oct-1927  Transition of Care [facility]) CM/SW [place], [place] Phone Number: 06/18/2020, 11:31 AM  Clinical Narrative:     CSW received return call from patients spouse. CSW provided patients spouse with SNF bed offers. Patients spouse chose [facility]. CSW spoke with Star at [facility] and they confirmed that they do not take [place] but that patient can come if they pay out of network cost. CSW called patients spouse back and no answer. CSW left voicemail with this information. CSW informed social worker Caren Griffins that is working floor today.  CSW will continue to follow.    Expected Discharge Plan: [place] Barriers to Discharge: Continued Medical Work up  Expected Discharge Plan and Services Expected Discharge Plan: [place] arrangements for the past 2 months: Single Family Home                                       Social Determinants of Health (SDOH) Interventions    Readmission Risk Interventions No flowsheet data found.

## 2020-06-18 NOTE — Progress Notes (Signed)
26 Days Post-Op Procedure(s) (LRB): AORTIC VALVE REPLACEMENT (AVR) USING EDWARDS RESILIA 29 MM AORTIC VALVE. (N/A) CORONARY ARTERY BYPASS GRAFTING (CABG) USING LIMA to LAD; ENDOSCOPIC HARVESTED RIGHT GREATER SAPHENOUS VEIN: SVG to PD; SVG to SEQUENCED INTERMEDIATE to OM (N/A) TRANSESOPHAGEAL ECHOCARDIOGRAM (TEE) (N/A) ENDOVEIN HARVEST OF GREATER SAPHENOUS VEIN (Right) Subjective: Slept well after 2 am.   Objective: Vital signs in last 24 hours: Temp:  [98 F (36.7 C)-98.6 F (37 C)] 98 F (36.7 C) (12/22 0800) Pulse Rate:  [68-81] 76 (12/22 0800) Cardiac Rhythm: Normal sinus rhythm;Bundle branch block;Heart block (12/22 0721) Resp:  [17-20] 20 (12/22 0800) BP: (96-133)/(59-93) 129/65 (12/22 0800) SpO2:  [94 %-95 %] 95 % (12/22 0800) Weight:  [79.3 kg] 79.3 kg (12/22 0500)  Hemodynamic parameters for last 24 hours:    Intake/Output from previous day: 12/21 0701 - 12/22 0700 In: 1050 [P.O.:1050] Out: 950 [Urine:950] Intake/Output this shift: No intake/output data recorded.  General appearance: alert and cooperative Neurologic: intact Heart: regular rate and rhythm Lungs: clear to auscultation bilaterally Extremities: noedema Wound: incision well healed  Lab Results: No results for input(s): WBC, HGB, HCT, PLT in the last 72 hours. BMET: Recent Labs    06/16/20 1210 06/17/20 0445  NA 141 141  K 5.0 4.4  CL 107 105  CO2 26 28  GLUCOSE 96 132*  BUN 38* 34*  CREATININE 1.19 1.17  CALCIUM 8.7* 8.7*    PT/INR: No results for input(s): LABPROT, INR in the last 72 hours. ABG    Component Value Date/Time   PHART 7.381 05/26/2020 1750   HCO3 22.2 05/26/2020 1750   TCO2 23 05/26/2020 1750   ACIDBASEDEF 2.0 05/26/2020 1750   O2SAT 64.7 06/16/2020 0320   CBG (last 3)  Recent Labs    06/17/20 2249 06/18/20 0500 06/18/20 0754  GLUCAP 125* 157* 116*    Assessment/Plan: S/P Procedure(s) (LRB): AORTIC VALVE REPLACEMENT (AVR) USING EDWARDS RESILIA 29 MM AORTIC  VALVE. (N/A) CORONARY ARTERY BYPASS GRAFTING (CABG) USING LIMA to LAD; ENDOSCOPIC HARVESTED RIGHT GREATER SAPHENOUS VEIN: SVG to PD; SVG to SEQUENCED INTERMEDIATE to OM (N/A) TRANSESOPHAGEAL ECHOCARDIOGRAM (TEE) (N/A) ENDOVEIN HARVEST OF GREATER SAPHENOUS VEIN (Right)  POD 26  Hemodynamically stable on midodrine 5 tid.   Volume status looks stable. Wt up 4-5 lbs from yesterday but not sure about accuracy of that.  Will remove feeding tube and stop feedings for now to see how he does. Encourage to eat and drink at least 3 Ensure per day.  Main goal now is ambulation.   LOS: 33 days    Gaye Pollack 06/18/2020

## 2020-06-18 NOTE — Progress Notes (Addendum)
Patient ID: [name] [name], male   DOB: 01-05-1928, 86 y.o.   MRN: [id number]     Advanced Heart Failure Rounding Note  PCP-Cardiologist: Peter Martinique, MD   Subjective:    Eating more. Cor-trak now out  No cardiac complaints. Denies CP. No dyspnea. VSS. BP stable on reduced dose of midodrine.  Has been ambulating w/ PT. Sleeping better.    Objective:   Weight Range: 79.3 kg Body mass index is 25.82 kg/m.   Vital Signs:   Temp:  [98 F (36.7 C)-98.6 F (37 C)] 98 F (36.7 C) (12/22 0800) Pulse Rate:  [68-81] 76 (12/22 0800) Resp:  [17-20] 20 (12/22 0800) BP: (96-133)/(59-93) 129/65 (12/22 0800) SpO2:  [94 %-95 %] 95 % (12/22 0800) Weight:  [79.3 kg] 79.3 kg (12/22 0500) Last BM Date: 06/17/20  Weight change: Filed Weights   06/16/20 0311 06/17/20 0537 06/18/20 0500  Weight: 78 kg 77.3 kg 79.3 kg    Intake/Output:   Intake/Output Summary (Last 24 hours) at 06/18/2020 1431 Last data filed at 06/18/2020 0030 Gross per 24 hour  Intake 810 ml  Output 950 ml  Net -140 ml      Physical Exam   PHYSICAL EXAM: General:  Fatigued appearing elderly WM. No respiratory difficulty HEENT: normal Neck: supple.  JVD ~8 cm. Carotids 2+ bilat; no bruits. No lymphadenopathy or thyromegaly appreciated. Cor: PMI nondisplaced. Regular rate & rhythm. No rubs, gallops or murmurs. Lungs: clear Abdomen: soft, nontender, nondistended. No hepatosplenomegaly. No bruits or masses. Good bowel sounds. Extremities: no cyanosis, clubbing, rash, edema Neuro: alert & oriented x 3, cranial nerves grossly intact. moves all 4 extremities w/o difficulty. Affect pleasant.   Telemetry   NSR w/ prolonged 1st degree AVB and PACs HR 80s    Labs    CBC No results for input(s): WBC, NEUTROABS, HGB, HCT, MCV, PLT in the last 72 hours. Basic Metabolic Panel Recent Labs    06/16/20 1210 06/17/20 0445  NA 141 141  K 5.0 4.4  CL 107 105  CO2 26 28  GLUCOSE 96 132*  BUN 38* 34*  CREATININE  1.19 1.17  CALCIUM 8.7* 8.7*   Liver Function Tests No results for input(s): AST, ALT, ALKPHOS, BILITOT, PROT, ALBUMIN in the last 72 hours. No results for input(s): LIPASE, AMYLASE in the last 72 hours. Cardiac Enzymes No results for input(s): CKTOTAL, CKMB, CKMBINDEX, TROPONINI in the last 72 hours.  BNP: BNP (last 3 results) Recent Labs    11/19/19 1550 05/16/20 1736  BNP 1,139.7* 2,005.2*    ProBNP (last 3 results) Recent Labs    01/02/20 1134  PROBNP 1,167.0*     D-Dimer No results for input(s): DDIMER in the last 72 hours. Hemoglobin A1C No results for input(s): HGBA1C in the last 72 hours. Fasting Lipid Panel No results for input(s): CHOL, HDL, LDLCALC, TRIG, CHOLHDL, LDLDIRECT in the last 72 hours. Thyroid Function Tests No results for input(s): TSH, T4TOTAL, T3FREE, THYROIDAB in the last 72 hours.  Invalid input(s): FREET3  Other results:   Imaging    No results found.   Medications:     Scheduled Medications: . acetaminophen  650 mg Oral TID  . ALPRAZolam  0.25 mg Oral QHS  . antiseptic oral rinse  15 mL Mouth Rinse TID  . aspirin EC  81 mg Oral Daily  . Chlorhexidine Gluconate Cloth  6 each Topical Daily  . docusate sodium  200 mg Oral Daily  . feeding supplement  237 mL Oral TID  BM  . ferrous [id number] C-folic acid  1 capsule Oral BID PC  . furosemide  40 mg Oral Daily  . insulin aspart  0-15 Units Subcutaneous TID WC  . mouth rinse  15 mL Mouth Rinse BID  . midodrine  5 mg Oral TID  . mirtazapine  7.5 mg Oral QHS  . multivitamin with minerals  1 tablet Oral Daily  . pantoprazole  40 mg Oral Daily  . senna  1 tablet Oral Daily  . tamsulosin  0.4 mg Oral Daily    Infusions: . sodium chloride Stopped (06/08/20 0941)    PRN Medications: sodium chloride, bisacodyl, Gerhardt's butt cream, melatonin, ondansetron (ZOFRAN) IV, oxyCODONE, sodium chloride flush, traMADol   Assessment/Plan   1. Severe 3V CAD: - s/p CABGx 4  (LIMA-LAD, sequential SVG-OM1 and OM2, SVG- PDA) - No s/s ischemia - continue ASA 81 - no ? blocker w/ post-cardiotomy shock and low BP - Atorva started 12/7 and stopped 12/8. Doubt this is contributing to his weakness but ok to hold   2. Aortic Stenosis: - severe low flow low gradient AS - s/p tissue AVR 11/26  - AV prosthesis ok on post op echo - stable  3. Acute on Chronic Biventricular Heart Failure:  - Echo in 2014 showed normal LVEF 55-60% w/ G1DD.  - Echo 10/21 and EF 30-35% w/ global HK. RV systolic function mildly reduced. - LHC this admit w/ severe 3V CAD>>ICM. Now s/p CABG. Also w/ severe AS, possibly contributing to LV dysfunction, now s/p SAVR.  - post op recovery c/b difficulties weaning pressors/inotropes.  - Post-op echo 12/1 EF 30-35%, RV mildly reduced. AV prothesis ok - off NE - Dobutamine stopped 12/18.  Co-ox was stable at 65% on 12/20  - Volume status much improved. Wt up slightly from yesterday. Follow trend and continue 40 PO Lasix daily. BMP in am - Continue on midodrine, wean off as tolerated  - Off digoxin with ? heart block.    4. Post Operative Atrial Fibrillation:   - Remains in NSR - defer anticoagulation to CT surgery    5. AKI:   - Baseline <1.0.  - 1.17 today. Repeat BMP again in AM   6. Hyponatremia - resolved   7. Anemia - expected ABLA from surgery  - on PO Fe  - Hgb has been stable ~9    8. Debility - Major issue currently. Continue aggressive PT/OT - Recommending SNF. May be able to go later this week   9. Urinary Retention - Prior to admit I&O cath at home.  - Continues with I&O cath every 6 hours.   10. PNA  - CXR w/ bilateral infiltrates, PCT 0.26  - completed Vanc + cefepime x 7 days - resolved  11. Rhythm - insinus with long 1AVB - Digoxin discontinued.   12. Constipation  - On senna. Can give sorbitol as needed  13. Insomnia/anxiety/depression  - Palliative care team has adjusted meds. Sleep improving.  Appreciate PC recommendations    [name] [name]  06/18/2020 2:31 PM   Patient seen and examined with the above-signed Advanced Practice Provider and/or Housestaff. I personally reviewed laboratory data, imaging studies and relevant notes. I independently examined the patient and formulated the important aspects of the plan. I have edited the note to reflect any of my changes or salient points. I have personally discussed the plan with the patient and/or family.  Cor-trak out. Eating pretty well.  C/o constipation. Says he walked several times per  day  No CP or SOB. Rhythms stable  General:  Elderly No resp difficulty HEENT: normal Neck: supple. no JVD. Carotids 2+ bilat; no bruits. No lymphadenopathy or thryomegaly appreciated. Cor: PMI nondisplaced. Regular rate & rhythm. No rubs, gallops or murmurs. Lungs: clear Abdomen: soft, nontender, nondistended. No hepatosplenomegaly. No bruits or masses. Good bowel sounds. Extremities: no cyanosis, clubbing, rash, edema Neuro: alert & orientedx3, cranial nerves grossly intact. moves all 4 extremities w/o difficulty. Affect grumpy  He is improved. Cor-trak out.  Encouraged po intake. Continue current regimen. Will give sorbitol. Likely ready for SNF soon.   [name] Bickers, MD  5:42 PM

## 2020-06-18 NOTE — Progress Notes (Signed)
Nutrition Follow-up  DOCUMENTATION CODES:   Non-severe (moderate) malnutrition in context of chronic illness  INTERVENTION:   Nocturnal TF discontinued, Cortrak removed  Continue Ensure Enlive po TID, each supplement provides 350 kcal and 20 grams of protein  Encourage po intake  Continue MVI with minerals daily   NUTRITION DIAGNOSIS:   Moderate Malnutrition related to chronic illness (CHF, LLE edema) as evidenced by mild fat depletion,severe muscle depletion,moderate muscle depletion.  Ongoing   GOAL:   Patient will meet greater than or equal to 90% of their needs  Ongoing  MONITOR:   PO intake,Supplement acceptance,Labs,Weight trends,Skin  REASON FOR ASSESSMENT:   Consult Enteral/tube feeding initiation and management  ASSESSMENT:   Pt with PMH of HTN, HLD, chronic LLE, peripheral neuropathy, essential tremor, and prostate cancer. Presented with CHF exacerbation and severe low flow AS. S/p right/left heart cath and coronary angiography on 11/22. Underwent CABG x4 and tissue AVR on 11/26.   Cortrak feeding tube removed 12/22. Pt reports he is drinking 3 Ensures a day. He reports he eats some items off his tray but not all of them. Pt reports he does not feel hungry but tries to eat what he can. Per RN, pt is drinking 3 Ensures a day. RN reports his meal completions vary. RN reports this AM that pt had one bite of muffin. RN instructed to encourage po intake at meal times. Per chart review, pt is completing 25-75% of last 7 documented meals.   Pt educated on the importance of drinking Ensures and completing meals now that nutrition support is discontinued. Pt advised to consume small, frequent meals as a way to increase protein and calorie intake if feeling full. Pt also advised to choose protein containing foods at meals.   Pt stated he was hungry and ready for lunch during RD visit. Order of salmon, mashed potatoes, tomato soup, vanilla yogurt and cranberry juice  placed for pt at 11:50 AM.   Pt no longer on marinol or dobutamine.   Labs reviewed: CBG's x 24 hours: 90-180  Medications reviewed and include: Colace, Trinsicon, Lasix, SSI, MVI with minerals, Protonix  Current weight: 79.3 kg. Up from weight of 71.1 kg on 12/15. Admit weight around 79-83 kg.    Diet Order:   Diet Order            Diet regular Room service appropriate? Yes with Assist; Fluid consistency: Thin  Diet effective now                 EDUCATION NEEDS:   Education needs have been addressed  Skin:  Skin Assessment: Skin Integrity Issues: Skin Integrity Issues:: Stage II Stage II: sacrum  Last BM:  06/17/2020  Height:   Ht Readings from Last 1 Encounters:  05/23/20 5\' 9"  (1.753 m)    Weight:   Wt Readings from Last 1 Encounters:  06/18/20 79.3 kg    Ideal Body Weight:  72.7 kg  BMI:  Body mass index is 25.82 kg/m.  Estimated Nutritional Needs:   Kcal:  1700-1900  Protein:  90-110  Fluid:  >/= 2 L/day    Ronnald Nian, Dietetic Intern Pager: [phone] If unavailable: [phone]

## 2020-06-18 NOTE — Progress Notes (Deleted)
      [address]       [place] [address]             [phone]      26 Days Post-Op Procedure(s) (LRB): AORTIC VALVE REPLACEMENT (AVR) USING EDWARDS RESILIA 29 MM AORTIC VALVE. (N/A) CORONARY ARTERY BYPASS GRAFTING (CABG) USING LIMA to LAD; ENDOSCOPIC HARVESTED RIGHT GREATER SAPHENOUS VEIN: SVG to PD; SVG to SEQUENCED INTERMEDIATE to OM (N/A) TRANSESOPHAGEAL ECHOCARDIOGRAM (TEE) (N/A) ENDOVEIN HARVEST OF GREATER SAPHENOUS VEIN (Right)   Subjective:  No new complaints.  Remains weak and fatigued.  States slept well last night.  Eating what he is able.  Walked with cardiac rehab yesterday.   Objective: Vital signs in last 24 hours: Temp:  [98 F (36.7 C)-98.6 F (37 C)] 98.3 F (36.8 C) (12/22 0457) Pulse Rate:  [68-81] 68 (12/22 0457) Cardiac Rhythm: Normal sinus rhythm;Bundle branch block;Heart block (12/22 0721) Resp:  [17-20] 20 (12/22 0457) BP: (96-133)/(59-93) 96/59 (12/22 0457) SpO2:  [94 %-95 %] 95 % (12/22 0457) Weight:  [79.3 kg] 79.3 kg (12/22 0500)  Intake/Output from previous day: 12/21 0701 - 12/22 0700 In: 1050 [P.O.:1050] Out: 950 [Urine:950]  General appearance: alert, cooperative and no distress Heart: regular rate and rhythm Lungs: clear to auscultation bilaterally Abdomen: soft, non-tender; bowel sounds normal; no masses,  no organomegaly Extremities: extremities normal, atraumatic, no cyanosis or edema Wound: clean and dry  Lab Results: No results for input(s): WBC, HGB, HCT, PLT in the last 72 hours. BMET: Recent Labs    06/16/20 1210 06/17/20 0445  NA 141 141  K 5.0 4.4  CL 107 105  CO2 26 28  GLUCOSE 96 132*  BUN 38* 34*  CREATININE 1.19 1.17  CALCIUM 8.7* 8.7*    PT/INR: No results for input(s): LABPROT, INR in the last 72 hours. ABG    Component Value Date/Time   PHART 7.381 05/26/2020 1750   HCO3 22.2 05/26/2020 1750   TCO2 23 05/26/2020 1750   ACIDBASEDEF 2.0 05/26/2020 1750   O2SAT 64.7 06/16/2020 0320    CBG (last 3)  Recent Labs    06/17/20 1959 06/17/20 2249 06/18/20 0500  GLUCAP 180* 125* 157*    Assessment/Plan: S/P Procedure(s) (LRB): AORTIC VALVE REPLACEMENT (AVR) USING EDWARDS RESILIA 29 MM AORTIC VALVE. (N/A) CORONARY ARTERY BYPASS GRAFTING (CABG) USING LIMA to LAD; ENDOSCOPIC HARVESTED RIGHT GREATER SAPHENOUS VEIN: SVG to PD; SVG to SEQUENCED INTERMEDIATE to OM (N/A) TRANSESOPHAGEAL ECHOCARDIOGRAM (TEE) (N/A) ENDOVEIN HARVEST OF GREATER SAPHENOUS VEIN (Right)  1. CV- hemodynamically stable in NSR, BP stable on Midodrine 5 mg TID 2. Pulm- no acute issues, continue IS 3. Renal- creatinine stable, weight is below admission, on lasix 40 mg daily 4. GI- poor oral intake, continue supplemental enteral nutrition... patient eating as able 5. Deconditioning- working with PT/OT.. for SNF, possibly early next week 6. Dispo- patient stable, continue current care, continue PT/OT, ambulate, increasing oral intake as tolerated   LOS: 33 days   Ellwood Handler, PA-C 06/18/2020

## 2020-06-18 NOTE — Progress Notes (Signed)
Feeding tube was removed from the patient. Tolerated well with no complaints. Patient is swallowing well. Will continue to monitor.   -Donnelly Angelica, RN

## 2020-06-19 LAB — BASIC METABOLIC PANEL
Anion gap: 7 (ref 5–15)
BUN: 37 mg/dL — ABNORMAL HIGH (ref 8–23)
CO2: 30 mmol/L (ref 22–32)
Calcium: 8.6 mg/dL — ABNORMAL LOW (ref 8.9–10.3)
Chloride: 105 mmol/L (ref 98–111)
Creatinine, Ser: 1.12 mg/dL (ref 0.61–1.24)
GFR, Estimated: >60 mL/min (ref >60)
Glucose, Bld: 104 mg/dL — ABNORMAL HIGH (ref 70–99)
Potassium: 4.8 mmol/L (ref 3.5–5.1)
Sodium: 142 mmol/L (ref 135–145)

## 2020-06-19 LAB — CBC
HCT: 30.3 % — ABNORMAL LOW (ref 39.0–52.0)
Hemoglobin: 9.2 g/dL — ABNORMAL LOW (ref 13.0–17.0)
MCH: 30.3 pg (ref 26.0–34.0)
MCHC: 30.4 g/dL (ref 30.0–36.0)
MCV: 99.7 fL (ref 80.0–100.0)
Platelets: 165 10*3/uL (ref 150–400)
RBC: 3.04 MIL/uL — ABNORMAL LOW (ref 4.22–5.81)
RDW: 15 % (ref 11.5–15.5)
WBC: 5.7 10*3/uL (ref 4.0–10.5)
nRBC: 0 % (ref 0.0–0.2)

## 2020-06-19 LAB — GLUCOSE, CAPILLARY
Glucose-Capillary: 100 mg/dL — ABNORMAL HIGH (ref 70–99)
Glucose-Capillary: 136 mg/dL — ABNORMAL HIGH (ref 70–99)
Glucose-Capillary: 148 mg/dL — ABNORMAL HIGH (ref 70–99)
Glucose-Capillary: 75 mg/dL (ref 70–99)
Glucose-Capillary: 123 mg/dL — ABNORMAL HIGH (ref 70–99)
Glucose-Capillary: 157 mg/dL — ABNORMAL HIGH (ref 70–99)

## 2020-06-19 NOTE — Progress Notes (Signed)
CARDIAC REHAB PHASE I   PRE:  Rate/Rhythm: 82 first deg    BP: sitting 132/86    SaO2: 100 2L, 99 RA  MODE:  Ambulation: 430 ft   POST:  Rate/Rhythm: 100 first deg with PACs    BP: sitting 138/101     SaO2: 97 RA  Pt progressing. Able to move hips to EOB with less assist, min-mod assist now. Mod assist to stand from EOB. To BR for BM. Had to sit and stand x2 which from lower surface needed mod-max assist x2 with gait belt. Steady in hall, min assist with gait belt. Increased distance, sat x1 for rest. To recliner. VSS. Encouraged IS, still 300-400 mL, does too fast. Motivated to walk. Discussed sternal precautions, IS, mobility at home, and CRPII. Will refer to [place] (for down the road). [id number]   [place], ACSM 06/19/2020 10:27 AM

## 2020-06-19 NOTE — Progress Notes (Signed)
Physical Therapy Treatment Patient Details Name: [name] [name] MRN: [id number] DOB: [birth year] Today's Date: 06/19/2020    History of Present Illness 86 yo male presenting to PCP with worsening LE edema and dyspnea. S/p right/left heart cath and coronary angiography on 11/22. S/p CABG x4 on 11/26. PMH including venous insufficiency with chronic lower extremity edema (mostly on the left), hypertension, hyperlipidemia, peripheral neuropathy, Bil TKA,  essential tremor, and prostate cancer.    PT Comments    Pt seated in recliner.  Pt reviewed precautions with cues for PTA.  Pt required assistance for transfer and gt training ranging from min to mod assistance.  Continue to recommend snf placement to improve strength and function before returning home.     Follow Up Recommendations  SNF     Equipment Recommendations  None recommended by PT    Recommendations for Other Services       Precautions / Restrictions Precautions Precautions: Fall;Sternal Precaution Booklet Issued: No Precaution Comments: Reviewed sternal precautions, pt not able to recall Restrictions Weight Bearing Restrictions: No    Mobility  Bed Mobility Overal bed mobility: Needs Assistance Bed Mobility: Rolling;Sit to Sidelying Rolling: Mod assist       Sit to sidelying: Mod assist;+2 for safety/equipment;+2 for physical assistance General bed mobility comments: Pt seated in recliner on arrival.  Pt required assistance to move to sidelying and roll into supine.  Pt required assistance to lower trunk and lift B LEs.  Transfers Overall transfer level: Needs assistance Equipment used: Rolling walker (2 wheeled) Transfers: Sit to/from Stand Sit to Stand: Mod assist         General transfer comment: Assistance to power up into standing.  Cues for hand placement on knees to rock forward and push into standing.  Ambulation/Gait Ambulation/Gait assistance: Min assist Gait Distance (Feet): 25 Feet Assistive  device: Rolling walker (2 wheeled) Gait Pattern/deviations: Step-through pattern;Trunk flexed;Decreased stride length;Wide base of support;Drifts right/left Gait velocity: decreased   General Gait Details: Cues for sequencing and safety with turns and backing.  Limited gt distance due to lunch arriving.   Stairs             Wheelchair Mobility    Modified Rankin (Stroke Patients Only)       Balance Overall balance assessment: Needs assistance Sitting-balance support: No upper extremity supported;Feet supported Sitting balance-Leahy Scale: Fair Sitting balance - Comments: Supervision for safety. Postural control: Posterior lean   Standing balance-Leahy Scale: Poor Standing balance comment: Requires UE support in standing.                            Cognition Arousal/Alertness: Awake/alert Behavior During Therapy: WFL for tasks assessed/performed;Anxious Overall Cognitive Status: Within Functional Limits for tasks assessed                                 General Comments: Pt with unrealistic expectations/timeline for recovery. Discussed taking it day by day; pt anxious. Does well with reassurance and stating realistic expectations.      Exercises      General Comments        Pertinent Vitals/Pain Pain Assessment: No/denies pain    Home Living                      Prior Function            PT Goals (current goals  can now be found in the care plan section) Acute Rehab PT Goals Patient Stated Goal: To get back to bed and eat lunch Potential to Achieve Goals: Fair Progress towards PT goals: Progressing toward goals    Frequency    Min 2X/week      PT Plan Current plan remains appropriate    Co-evaluation              AM-PAC PT "6 Clicks" Mobility   Outcome Measure  Help needed turning from your back to your side while in a flat bed without using bedrails?: A Little Help needed moving from lying on your  back to sitting on the side of a flat bed without using bedrails?: A Lot Help needed moving to and from a bed to a chair (including a wheelchair)?: A Lot Help needed standing up from a chair using your arms (e.g., wheelchair or bedside chair)?: A Lot Help needed to walk in hospital room?: A Little Help needed climbing 3-5 steps with a railing? : A Little 6 Click Score: 15    End of Session Equipment Utilized During Treatment: Gait belt Activity Tolerance: Patient limited by fatigue Patient left: in bed;with call bell/phone within reach;with bed alarm set Nurse Communication: Mobility status PT Visit Diagnosis: Muscle weakness (generalized) (M62.81);Difficulty in walking, not elsewhere classified (R26.2);Other abnormalities of gait and mobility (R26.89) Pain - part of body:  (sternum)     Time: 6433-2951 PT Time Calculation (min) (ACUTE ONLY): 17 min  Charges:  $Gait Training: 8-22 mins                     [name] [name] , PTA Acute Rehabilitation Services Pager [phone] Office [phone]     [name] [name] [name] [name] 06/19/2020, 12:22 PM

## 2020-06-19 NOTE — Discharge Summary (Addendum)
[address]       [place] [address]             [phone]    Physician Discharge Summary  Patient ID: [name] [name] MRN: [id number] DOB/AGE: 10/11/1927 86 y.o.  Admit date: 05/16/2020 Discharge date: 06/26/2020  Admission Diagnoses:  Patient Active Problem List   Diagnosis Date Noted  . Pressure injury of skin 05/20/2020  . Acute on chronic combined systolic and diastolic CHF (congestive heart failure) ([facility]) 05/16/2020  . Severe aortic stenosis 05/16/2020  . Chronic kidney disease (CKD), stage III (moderate) ([facility]) 05/16/2020  . Pleural effusion on right 01/02/2020  . S/P shoulder replacement, right 06/04/2016  . Venous insufficiency 05/15/2015  . BPH (benign prostatic hyperplasia) 08/15/2014  . CAP (community acquired pneumonia) 08/14/2014  . Colitis 08/14/2014  . Pneumonia 08/13/2014  . OA (osteoarthritis) of knee 04/09/2013  . Edema   . Dyspnea 09/27/2012  . Bilateral leg edema 09/27/2012  . HTN (hypertension) 09/27/2012  . Hyperlipidemia    Discharge Diagnoses:  Patient Active Problem List   Diagnosis Date Noted  . Malnutrition of moderate degree 05/29/2020  . S/P CABG x 4 05/23/2020  . Pressure injury of skin 05/20/2020  . Acute on chronic combined systolic and diastolic CHF (congestive heart failure) ([facility]) 05/16/2020  . Severe aortic stenosis 05/16/2020  . Chronic kidney disease (CKD), stage III (moderate) ([facility]) 05/16/2020  . Pleural effusion on right 01/02/2020  . S/P shoulder replacement, right 06/04/2016  . Venous insufficiency 05/15/2015  . BPH (benign prostatic hyperplasia) 08/15/2014  . CAP (community acquired pneumonia) 08/14/2014  . Colitis 08/14/2014  . Pneumonia 08/13/2014  . OA (osteoarthritis) of knee 04/09/2013  . Edema   . Dyspnea 09/27/2012  . Bilateral leg edema 09/27/2012  . HTN (hypertension) 09/27/2012  . Hyperlipidemia     Discharged Condition: fair  History of Present Illness:  [name] is a  86 year old gentleman with hypertension, hyperlipidemia, degenerative arthritis status post bilateral knee replacement and right shoulder replacement, peripheral neuropathy, and aortic stenosis with chronic diastolic congestive heart failure who has been followed by Dr. Martinique.  He has a history of chronic lower extremity edema with no prior DVT.  He was treated for pneumonia in April 2021 and was Covid negative.  He was also found to have a pleural effusion and started on Lasix.  An echocardiogram in June 2021 showed a left ventricular ejection fraction of 40 to 45% with global hypokinesis and severe hypokinesis of the entire inferior wall and inferoseptal wall.  There was moderate aortic stenosis with a mean gradient of 20 mmHg, aortic valve area 1.38 cm, and dimensionless index of 0.33.  There is also moderate mitral regurgitation.  He was seen in September 2021 with worsening congestive heart failure with increasing lower extremity edema, weight gain, and progressive fatigue.  His diuretic was increased.  A follow-up echocardiogram on 04/01/2020 showed a further drop in his ejection fraction to 30 to 35% with global hypokinesis.  There is moderate mitral regurgitation.  The aortic valve leaflets were thickened and calcified with a mean gradient of 24 mmHg and a valve area of 1.14 cm.  Dimensionless index was 0.25.  He was seen in the office on 05/15/2020 by Dr. Martinique and reported an 11 pound weight gain over the prior 3 weeks with progressive shortness of breath, lower extremity edema, and fatigue.  He was admitted for intravenous diuresis and is 14.3 L negative since admission.  Cardiac catheterization  was performed  05/20/2020 and showed severe three-vessel coronary disease with heavily calcified vessels.  The LAD has 85% proximal stenosis.  The left circumflex has a large first marginal branch that has 95% stenosis and then is occluded with faint filling of the second marginal by collaterals.  The right  coronary artery has 90% mid vessel stenosis followed by occlusion with the PDA and PL branch filling by collaterals from the left.  It was felt coronary bypass grafting and aortic valve replacement would be indicated and TCTS consult was requested.  The patient was evaluated by Dr. Cyndia [name] and he was felt to be a candidate for surgery.  He would be considered a high risk candidate due to his advanced age.  The risks and benefits of the procedure were explained to the patient and he was agreeable to proceed.  Hospital Course:  [name] was taken to the operating room on 05/23/2020.  He underwent CABG x  utilizing LIMA to LAD, SVG to PDA, Sequential SVG to OM1 and OM 2.  He also underwent Aortic Valve Replacement with a 29 mm Edwards Inspiris Resilia bioprosthetic valve.  He also underwent endoscopic vein harvest from his right leg.  He tolerated the procedure without difficulty and was taken to the SICU in stable condition.  He was transfused packed cells, cryo and FFP post operatively for coagulopathy.  This persisted and the patient was transfused another unit of packed cells and was treated with DDAVP.  The patient was weaned and extubated overnight.  The patient suffered a vagal episode upon standing and become unresponsive briefly.  He was sat down and was alert and hemodynamically stable.  He was started on Amiodarone.  He was weaned of Dopamine, Milrinone and Levophed as hemodynamics allowed.  His chest tubes and arterial lines were removed on 05/25/2020.  He was transitioned to an oral Amiodarone regimen.  The patient was having issues with hypotension.  His Milrinone was discontinued.  On 05/27/2020 he remained hemodynamically stable but still remained dependent on norepinephrine and midodrine for blood pressure support.  He remained volume overloaded and did not respond well to torsemide on the previous day and therefore it was felt he should be transitioned to a Lasix drip.  Additionally the  advanced heart failure team was consulted to assist with management.  It was felt he may be in atrial fibrillation and was AV paced at 90 to assist also with blood pressure and cardiac output since rhythm was noted to be slower and irregular on its own.  The patient underwent PICC line placement on 05/28/2020.  He underwent an Echocardiogram which showed a reduced EF of 35%.  A properly functioning aortic valve prosthesis in present.  The patient continued to require pressor support.  It was felt these should not be weaned until his hypervolemic state was improved.  He will not be started on a beta blocker due to post operative cardiotomy shock.  He developed elevated bicarb level due to aggressive diuresis.  He was treated with Diamox for this.  He has been weaned of all IV support but is using midodrine for BP support.  He was evaluated by PT/OT who recommended SNF placement at time of discharge.  On 06/05/2020 patient did have a decrease in his co-oximetry and subsequently has been started on dobutamine.  Co-oximetry was improved to 55.  He remains in sinus rhythm at this time.  He has also developed an increased oxygen requirement and leukocytosis with white blood cell count  on 06/06/2020 19.9.  Right lower lobe opacity on chest x-ray is concerning for possible pneumonia.  He has been started on empiric vancomycin and cefepime per pharmacy dosing.  Weight trending also has been increasing and he has been resumed on diuretics.  He is on tube feeds for maximization of nutrition protein/calorie intake.  Weakness and debilitation continues to be a significant problem.  On 2020-06-09 his chest x-ray improved in appearance of the suspected pneumonia.  White blood cell count has returned to normal.  He remained stable on low-dose dobutamine with a coox of 68% and CVP of 5.  He continues to require diuretics and has had some urinary retention requiring straight catheterization.  On 06/13/2020 he was felt to be Lasix was  placed on hold.  He continues to maintain sinus rhythm.  He continues with physical therapy showing a slow but steady improvement in this regard.  He continues to have some difficulty with motivation depression. As of 06/15/2020 his dobutamine has been weaned off and he maintains good cooximetry with most recent value 70. Palliative care has been asked to assist with symptom management and they have seen the patient and made suggestions.  The patient continues to wish to become fully recovered.  His Cortrak and tube feedings were discontinued ton 06/18/2020.  His oral intake improved.  He supplemented with Ensure in between meals.  He continued to work with PT/OT to increase strength and mobility.   He did go into atrial flutter on 1227 and subsequently has been started on amiodarone as well as Eliquis.  He is currently back in sinus rhythm with first deg block and some rate controlled afib.  He continues to be weak and fatigued easily but overall his status is felt to be quite stable for transfer to a skilled nursing facility for further rehab.   Consults: palliative care  Significant Diagnostic Studies:   Mid RCA lesion is 90% stenosed.  Mid RCA to Dist RCA lesion is 100% stenosed.  Prox LAD to Mid LAD lesion is 85% stenosed.  1st Mrg lesion is 95% stenosed.  Prox Cx to Mid Cx lesion is 100% stenosed.  LV end diastolic pressure is mildly elevated.  Hemodynamic findings consistent with mild pulmonary hypertension.  2nd Diag lesion is 100% stenosed.   1. Severe complex 3 vessel obstructive CAD.  2. Low flow Aortic stenosis. The valve is severely calcified. By cath mean gradient is 14 mm Hg.  3. Mildly elevated LV filling pressures 4. Mild pulmonary HTN with mean PA pressure 22 mmHg 5. Cardiac index 2.74.  Plan: surgical consultation for CABG/AVR. Will resume lasix 40 mg po bid.    ECHO: IMPRESSIONS    1. LVEF has decreased from 12/10/2019, now 30-35% with LV dyssynchrony.   Aortic stenosis is most probably severe with low flow low gradient,  dimensionless index 0.26.  2. Left ventricular ejection fraction, by estimation, is 30 to 35%. The  left ventricle has moderately decreased function. The left ventricle  demonstrates global hypokinesis. The left ventricular internal cavity size  was mildly dilated. There is severe  asymmetric left ventricular hypertrophy of the basal-septal segment. Left  ventricular diastolic function could not be evaluated.  3. Right ventricular systolic function is mildly reduced. The right  ventricular size is mildly enlarged. There is moderately elevated  pulmonary artery systolic pressure. The estimated right ventricular  systolic pressure is 42.5 mmHg.  4. Left atrial size was severely dilated.  5. Right atrial size was moderately dilated.  6. The mitral valve is normal in structure. Moderate mitral valve  regurgitation. No evidence of mitral stenosis.  7. The aortic valve is normal in structure. Aortic valve regurgitation is  not visualized. Moderate to severe aortic valve stenosis. Aortic valve  mean gradient measures 24.0 mmHg.  8. Aortic dilatation noted. There is mild to moderate dilatation of the  ascending aorta, measuring 44 mm.  9. The inferior vena cava is dilated in size with <50% respiratory  variability, suggesting right atrial pressure of 15 mmHg.   Treatments: surgery:   Surgeon:  Gaye Pollack, MD  First Assistant: Jadene Pierini,  PA-C   Preoperative Diagnosis:  Severe multi-vessel coronary artery disease and severe low flow, low gradient aortic stenosis   Postoperative Diagnosis:  Same   Procedure:  1. Median Sternotomy 2. Extracorporeal circulation 3.   Coronary artery bypass grafting x 4   Left internal mammary artery graft to the LAD  Sequential SVG to OM1 and OM2  SVG to PDA  4.   Endoscopic vein harvest from the right leg 5.   Aortic valve replacement using a 29 mm  Edwards INSPIRIS RESILIA pericardial valve.   Discharge Exam: Blood pressure 101/68, pulse 77, temperature 97.8 F (36.6 C), temperature source Oral, resp. rate 17, height 5\' 9"  (1.753 m), weight 76.5 kg, SpO2 96 %.   General appearance: alert, cooperative, appears stated age, fatigued and no distress Heart: regular rate and rhythm Lungs: clear to auscultation bilaterally Abdomen: benign Extremities: no edema Wound: incis healing well  Discharge Medications:  The patient has been discharged on:   1.Beta Blocker:  Yes [   ]                              No   [ X  ]                              If No, reason: labile BP  2.Ace Inhibitor/ARB: Yes [   ]                                     No  [ x   ]                                     If No, reason: labile BP  3.Statin:   Yes [   ]                  No  [ x  ]                  If No, reason: intolerance  4.Shela CommonsVelta Addison  [ X  ]                  No   [   ]                  If No, reason:    Discharge Instructions    Amb Referral to Cardiac Rehabilitation   Complete by: As directed    Diagnosis:  CABG Valve Replacement     Valve: Aortic   CABG X ___: 4   After initial evaluation and assessments completed: Virtual Based Care  may be provided alone or in conjunction with Phase 2 Cardiac Rehab based on patient barriers.: Yes   Discharge patient   Complete by: As directed    Discharge disposition: 03-Skilled [facility]   Discharge patient date: 06/26/2020     Allergies as of 06/26/2020      Reactions   Simvastatin Other (See Comments)   "caused neuropathy pain"      Medication List    STOP taking these medications   dutasteride 0.5 MG capsule Commonly known as: AVODART   furosemide 40 MG tablet Commonly known as: LASIX   losartan 25 MG tablet Commonly known as: COZAAR   sulfaSALAzine 500 MG tablet Commonly known as: AZULFIDINE     TAKE these medications   acetaminophen 325 MG tablet Commonly known  as: TYLENOL Take 2 tablets (650 mg total) by mouth every 6 (six) hours as needed.   amiodarone 200 MG tablet Commonly known as: PACERONE Take 1 tablet (200 mg total) by mouth 2 (two) times daily. For 7 days, then once daily   apixaban 5 MG Tabs tablet Commonly known as: ELIQUIS Take 1 tablet (5 mg total) by mouth 2 (two) times daily.   aspirin 81 MG tablet Take 81 mg by mouth at bedtime.   CENTRUM SILVER PO Take 1 tablet by mouth daily.   dextromethorphan-guaiFENesin 30-600 MG 12hr tablet Commonly known as: MUCINEX DM Take 1 tablet by mouth 2 (two) times daily as needed for cough.   feeding supplement Liqd Take 237 mLs by mouth 4 (four) times daily.   ferrous [id number] C-folic acid capsule Commonly known as: TRINSICON / FOLTRIN Take 1 capsule by mouth 2 (two) times daily after a meal.   melatonin 5 MG Tabs Take 1 tablet (5 mg total) by mouth at bedtime as needed (sleep).   midodrine 5 MG tablet Commonly known as: PROAMATINE Take 1 tablet (5 mg total) by mouth 3 (three) times daily.   tamsulosin 0.4 MG Caps capsule Commonly known as: FLOMAX Take 0.4 mg by mouth 2 (two) times daily.       Contact information for follow-up providers    Gaye Pollack, MD Follow up on 06/18/2020.   Specialty: Cardiothoracic Surgery Why: Appointment is at 4:30, please get CXR at 4:00 at [facility] located on first floor of our office building Contact information: [address] [place] [address] [phone]            Contact information for after-discharge care    Destination    [facility]erred [facility] .   Service: Skilled Nursing Contact information: [place] [phone]                  Signed: @mec @ 06/26/2020, 7:29 AM

## 2020-06-19 NOTE — TOC Progression Note (Addendum)
Transition of Care [facility]) - Progression Note    Patient Details  Name: [name] [name] MRN: [id number] Date of Birth: 09-28-1927  Transition of Care [facility]) CM/SW [place], [place] Phone Number: 06/19/2020, 3:40 PM  Clinical Narrative:     [facility] and patient's spouse has agreed to placement. Ronney Lion will start insurance auth today to determine if patient's insurance will cover  50% of the out of network cost and the family pays the other portion. Patient has [employer] - due to [employer]- more than likely will not know the outcome of  until the beginning of next week.   CSW will continue to follow and assist with discharge planning.  [name], MSW, LCSW Clinical Social Worker   Expected Discharge Plan: Skilled Nursing Facility Barriers to Discharge: Continued Medical Work up  Expected Discharge Plan and Services Expected Discharge Plan: [place] arrangements for the past 2 months: Single Family Home                                       Social Determinants of Health (SDOH) Interventions    Readmission Risk Interventions No flowsheet data found.

## 2020-06-19 NOTE — Progress Notes (Addendum)
Patient ID: Xavyer Steenson, male   DOB: 09-20-1927, 86 y.o.   MRN: [id number]     Advanced Heart Failure Rounding Note  PCP-Cardiologist: Peter Martinique, MD   Subjective:    Resting comfortably. Up in chair. No complaints.   Labs and VSS.    Objective:   Weight Range: 79.3 kg Body mass index is 25.82 kg/m.   Vital Signs:   Temp:  [97.9 F (36.6 C)-98.9 F (37.2 C)] 98.5 F (36.9 C) (12/23 1648) Pulse Rate:  [77] 77 (12/23 1648) Resp:  [20] 20 (12/23 1648) BP: (115-132)/(68-88) 132/88 (12/23 1648) SpO2:  [98 %] 98 % (12/23 1648) Last BM Date: 06/19/20  Weight change: Filed Weights   06/16/20 0311 06/17/20 0537 06/18/20 0500  Weight: 78 kg 77.3 kg 79.3 kg    Intake/Output:   Intake/Output Summary (Last 24 hours) at 06/19/2020 1708 Last data filed at 06/19/2020 1030 Gross per 24 hour  Intake 240 ml  Output 1875 ml  Net -1635 ml      Physical Exam   PHYSICAL EXAM: General:  Elderly WM resting comfortably in chair. No respiratory difficulty HEENT: normal Neck: supple.  JVD 8 cm. Carotids 2+ bilat; no bruits. No lymphadenopathy or thyromegaly appreciated. Cor: PMI nondisplaced. Regular rate & rhythm. No rubs, gallops or murmurs. Lungs: clear bilaterally, no wheezing  Abdomen: soft, nontender, nondistended. No hepatosplenomegaly. No bruits or masses. Good bowel sounds. Extremities: no cyanosis, clubbing, rash, edema Neuro: alert & oriented x 3, cranial nerves grossly intact. moves all 4 extremities w/o difficulty. Affect pleasant.   Telemetry   NSR w/ prolonged 1st degree AVB  HR 70s   Labs    CBC Recent Labs    06/19/20 0500  WBC 5.7  HGB 9.2*  HCT 30.3*  MCV 99.7  PLT 638   Basic Metabolic Panel Recent Labs    06/17/20 0445 06/19/20 0500  NA 141 142  K 4.4 4.8  CL 105 105  CO2 28 30  GLUCOSE 132* 104*  BUN 34* 37*  CREATININE 1.17 1.12  CALCIUM 8.7* 8.6*   Liver Function Tests No results for input(s): AST, ALT, ALKPHOS, BILITOT, PROT,  ALBUMIN in the last 72 hours. No results for input(s): LIPASE, AMYLASE in the last 72 hours. Cardiac Enzymes No results for input(s): CKTOTAL, CKMB, CKMBINDEX, TROPONINI in the last 72 hours.  BNP: BNP (last 3 results) Recent Labs    11/19/19 1550 05/16/20 1736  BNP 1,139.7* 2,005.2*    ProBNP (last 3 results) Recent Labs    01/02/20 1134  PROBNP 1,167.0*     D-Dimer No results for input(s): DDIMER in the last 72 hours. Hemoglobin A1C No results for input(s): HGBA1C in the last 72 hours. Fasting Lipid Panel No results for input(s): CHOL, HDL, LDLCALC, TRIG, CHOLHDL, LDLDIRECT in the last 72 hours. Thyroid Function Tests No results for input(s): TSH, T4TOTAL, T3FREE, THYROIDAB in the last 72 hours.  Invalid input(s): FREET3  Other results:   Imaging    No results found.   Medications:     Scheduled Medications: . acetaminophen  650 mg Oral TID  . ALPRAZolam  0.25 mg Oral QHS  . antiseptic oral rinse  15 mL Mouth Rinse TID  . aspirin EC  81 mg Oral Daily  . Chlorhexidine Gluconate Cloth  6 each Topical Daily  . docusate sodium  200 mg Oral Daily  . feeding supplement  237 mL Oral TID BM  . ferrous [id number] C-folic acid  1 capsule Oral BID PC  .  furosemide  40 mg Oral Daily  . insulin aspart  0-15 Units Subcutaneous TID WC  . mouth rinse  15 mL Mouth Rinse BID  . midodrine  5 mg Oral TID  . mirtazapine  7.5 mg Oral QHS  . multivitamin with minerals  1 tablet Oral Daily  . pantoprazole  40 mg Oral Daily  . senna  1 tablet Oral Daily  . tamsulosin  0.4 mg Oral Daily    Infusions: . sodium chloride Stopped (06/08/20 0941)    PRN Medications: sodium chloride, bisacodyl, Gerhardt's butt cream, melatonin, ondansetron (ZOFRAN) IV, oxyCODONE, sodium chloride flush, traMADol   Assessment/Plan   1. Severe 3V CAD: - s/p CABGx 4 (LIMA-LAD, sequential SVG-OM1 and OM2, SVG- PDA) - No s/s ischemia - continue ASA 81 - no ? blocker w/  post-cardiotomy shock and low BP - Atorva started 12/7 and stopped 12/8. Doubt this is contributing to his weakness but ok to hold   2. Aortic Stenosis: - severe low flow low gradient AS - s/p tissue AVR 11/26  - AV prosthesis ok on post op echo - stable  3. Acute on Chronic Biventricular Heart Failure:  - Echo in 2014 showed normal LVEF 55-60% w/ G1DD.  - Echo 10/21 and EF 30-35% w/ global HK. RV systolic function mildly reduced. - LHC this admit w/ severe 3V CAD>>ICM. Now s/p CABG. Also w/ severe AS, possibly contributing to LV dysfunction, now s/p SAVR.  - post op recovery c/b difficulties weaning pressors/inotropes.  - Post-op echo 12/1 EF 30-35%, RV mildly reduced. AV prothesis ok - off NE - Dobutamine stopped 12/18.  Co-ox was stable at 65% on 12/20  - Volume status ok.  continue 40 PO Lasix daily. BMP in am - Continue on midodrine, wean off as tolerated  - Off digoxin with ? heart block.    4. Post Operative Atrial Fibrillation:   - Remains in NSR - defer anticoagulation to CT surgery    5. AKI:   - Baseline <1.0.  - 1.12 today.   6. Hyponatremia - resolved   7. Anemia - expected ABLA from surgery  - on PO Fe  - Hgb has been stable 9.2 today   8. Debility - Major issue currently. Continue aggressive PT/OT - Recommending SNF. May be able to go next week   9. Urinary Retention - Prior to admit I&O cath at home.  - Continues with I&O cath every 6 hours.   10. PNA  - CXR w/ bilateral infiltrates, PCT 0.26  - completed Vanc + cefepime x 7 days - resolved  11. Rhythm - insinus with long 1AVB - Digoxin discontinued.   12. Constipation  - resolved w/ sorbitol   13. Insomnia/anxiety/depression  - Palliative care team has adjusted meds. Sleep improving. Appreciate PC recommendations    Nelida Gores  06/19/2020 5:08 PM   Patient seen and examined with the above-signed Advanced Practice Provider and/or Housestaff. I personally reviewed  laboratory data, imaging studies and relevant notes. I independently examined the patient and formulated the important aspects of the plan. I have edited the note to reflect any of my changes or salient points. I have personally discussed the plan with the patient and/or family.  Cortrak out. Eating reasonably well. Ambulates with assistance. Seems to be sleeping better.  General:  Sitting in chiar No resp difficulty HEENT: normal Neck: supple. no JVD. Carotids 2+ bilat; no bruits. No lymphadenopathy or thryomegaly appreciated. Cor: PMI nondisplaced. Regular rate & rhythm. No  rubs, gallops or murmurs. Lungs: clear Abdomen: soft, nontender, nondistended. No hepatosplenomegaly. No bruits or masses. Good bowel sounds. Extremities: no cyanosis, clubbing, rash, edema Neuro: alert & orientedx3, cranial nerves grossly intact. moves all 4 extremities w/o difficulty. Affect pleasant  Stable from HF perspective. Continue current meds. HF team will follow at a distance. Appears ready for SNF.   Glori Bickers, MD  6:05 PM

## 2020-06-19 NOTE — Progress Notes (Addendum)
      [address]       [place] [address]             [phone]      27 Days Post-Op Procedure(s) (LRB): AORTIC VALVE REPLACEMENT (AVR) USING EDWARDS RESILIA 29 MM AORTIC VALVE. (N/A) CORONARY ARTERY BYPASS GRAFTING (CABG) USING LIMA to LAD; ENDOSCOPIC HARVESTED RIGHT GREATER SAPHENOUS VEIN: SVG to PD; SVG to SEQUENCED INTERMEDIATE to OM (N/A) TRANSESOPHAGEAL ECHOCARDIOGRAM (TEE) (N/A) ENDOVEIN HARVEST OF GREATER SAPHENOUS VEIN (Right)   Subjective:  No new complaints.  Cortrak out, eating as able.  Ambulated once yesterday, required several breaks  Objective: Vital signs in last 24 hours: Temp:  [97.9 F (36.6 C)-98.9 F (37.2 C)] 98.9 F (37.2 C) (12/23 0449) Pulse Rate:  [73-76] 73 (12/22 1610) Cardiac Rhythm: Normal sinus rhythm;Heart block (12/23 0720) Resp:  [20] 20 (12/22 1610) BP: (123-129)/(65-85) 123/85 (12/22 1610) SpO2:  [91 %-95 %] 91 % (12/22 1610)  Intake/Output from previous day: 12/22 0701 - 12/23 0700 In: 480 [P.O.:480] Out: 1700 [Urine:1700]  General appearance: alert, cooperative and no distress Heart: regular rate and rhythm Lungs: clear to auscultation bilaterally Abdomen: soft, non-tender; bowel sounds normal; no masses,  no organomegaly Extremities: extremities normal, atraumatic, no cyanosis or edema Wound: healing well  Lab Results: Recent Labs    06/19/20 0500  WBC 5.7  HGB 9.2*  HCT 30.3*  PLT 165   BMET:  Recent Labs    06/17/20 0445 06/19/20 0500  NA 141 142  K 4.4 4.8  CL 105 105  CO2 28 30  GLUCOSE 132* 104*  BUN 34* 37*  CREATININE 1.17 1.12  CALCIUM 8.7* 8.6*    PT/INR: No results for input(s): LABPROT, INR in the last 72 hours. ABG    Component Value Date/Time   PHART 7.381 05/26/2020 1750   HCO3 22.2 05/26/2020 1750   TCO2 23 05/26/2020 1750   ACIDBASEDEF 2.0 05/26/2020 1750   O2SAT 64.7 06/16/2020 0320   CBG (last 3)  Recent Labs    06/18/20 2020 06/18/20 2344 06/19/20 0351   GLUCAP 153* 95 100*    Assessment/Plan: S/P Procedure(s) (LRB): AORTIC VALVE REPLACEMENT (AVR) USING EDWARDS RESILIA 29 MM AORTIC VALVE. (N/A) CORONARY ARTERY BYPASS GRAFTING (CABG) USING LIMA to LAD; ENDOSCOPIC HARVESTED RIGHT GREATER SAPHENOUS VEIN: SVG to PD; SVG to SEQUENCED INTERMEDIATE to OM (N/A) TRANSESOPHAGEAL ECHOCARDIOGRAM (TEE) (N/A) ENDOVEIN HARVEST OF GREATER SAPHENOUS VEIN (Right)  1. CV- hemodynamically stable, on Midodrine 5 mg TID 2. Pulm- no acute issues 3. Renal- volume status is stable, weight remains mildly elevated, continue Lasix 4. GI- cortrak removed, tube feedings stopped, continue to encourage oral intake, ensure between meals 5. Deconditioning- patient not seen by PT yesterday, walked with ambulation tech once, required several rest breaks, he declined to walk with nursing at times, patient needs to ambulate more frequently, CSW is working on placement   LOS: 34 days    Ellwood Handler, PA-C 06/19/2020   Chart reviewed, patient examined, agree with above. He says he feels better today and looks much brighter. Starting to smile a little. Walked 430 ft with cardiac rehab. Seen by PT today. Continue to encourage po diet, IS and ambulation. If her continues to improve over the weekend he may be able to go to SNF sometime next week.

## 2020-06-19 NOTE — Progress Notes (Signed)
Patient has been encouraged and educated twice this shift on the importance of ambulating however he has refused both times but says he will ambulate after he eats breakfast. Will continue encourage and re-educated patient on the need to cooperate with all components of his care and treatment plan to have the best recovery outcome.

## 2020-06-20 LAB — GLUCOSE, CAPILLARY
Glucose-Capillary: 124 mg/dL — ABNORMAL HIGH (ref 70–99)
Glucose-Capillary: 150 mg/dL — ABNORMAL HIGH (ref 70–99)
Glucose-Capillary: 239 mg/dL — ABNORMAL HIGH (ref 70–99)
Glucose-Capillary: 83 mg/dL (ref 70–99)
Glucose-Capillary: 93 mg/dL (ref 70–99)
Glucose-Capillary: 96 mg/dL (ref 70–99)

## 2020-06-20 NOTE — Progress Notes (Signed)
Performed straight cath per Md order and protocol 400 cc yellow urine returned pt tolerated well

## 2020-06-20 NOTE — Progress Notes (Signed)
28 Days Post-Op Procedure(s) (LRB): AORTIC VALVE REPLACEMENT (AVR) USING EDWARDS RESILIA 29 MM AORTIC VALVE. (N/A) CORONARY ARTERY BYPASS GRAFTING (CABG) USING LIMA to LAD; ENDOSCOPIC HARVESTED RIGHT GREATER SAPHENOUS VEIN: SVG to PD; SVG to SEQUENCED INTERMEDIATE to OM (N/A) TRANSESOPHAGEAL ECHOCARDIOGRAM (TEE) (N/A) ENDOVEIN HARVEST OF GREATER SAPHENOUS VEIN (Right) Subjective:  Slept well. Eating better. Walked some this am but felt dizzy and weak. His only complaint is of pain in butt from pressure sore. Can't get comfortable in chair.  Objective: Vital signs in last 24 hours: Temp:  [98.2 F (36.8 C)-98.6 F (37 C)] 98.2 F (36.8 C) (12/24 1152) Pulse Rate:  [68-84] 83 (12/24 1152) Cardiac Rhythm: Heart block (12/24 0700) Resp:  [20-24] 20 (12/24 1152) BP: (113-132)/(74-90) 125/74 (12/24 1152) SpO2:  [94 %-98 %] 95 % (12/24 1152)  Hemodynamic parameters for last 24 hours:    Intake/Output from previous day: 12/23 0701 - 12/24 0700 In: 237 [P.O.:237] Out: 2425 [Urine:2425] Intake/Output this shift: No intake/output data recorded.  General appearance: alert and cooperative Neurologic: intact Heart: regular rate and rhythm, S1, S2 normal, no murmur Lungs: clear to auscultation bilaterally Extremities: no edema Wound: incision healing well  Lab Results: Recent Labs    06/19/20 0500  WBC 5.7  HGB 9.2*  HCT 30.3*  PLT 165   BMET:  Recent Labs    06/19/20 0500  NA 142  K 4.8  CL 105  CO2 30  GLUCOSE 104*  BUN 37*  CREATININE 1.12  CALCIUM 8.6*    PT/INR: No results for input(s): LABPROT, INR in the last 72 hours. ABG    Component Value Date/Time   PHART 7.381 05/26/2020 1750   HCO3 22.2 05/26/2020 1750   TCO2 23 05/26/2020 1750   ACIDBASEDEF 2.0 05/26/2020 1750   O2SAT 64.7 06/16/2020 0320   CBG (last 3)  Recent Labs    06/20/20 0424 06/20/20 0733 06/20/20 1148  GLUCAP 93 96 150*    Assessment/Plan: S/P Procedure(s) (LRB): AORTIC VALVE  REPLACEMENT (AVR) USING EDWARDS RESILIA 29 MM AORTIC VALVE. (N/A) CORONARY ARTERY BYPASS GRAFTING (CABG) USING LIMA to LAD; ENDOSCOPIC HARVESTED RIGHT GREATER SAPHENOUS VEIN: SVG to PD; SVG to SEQUENCED INTERMEDIATE to OM (N/A) TRANSESOPHAGEAL ECHOCARDIOGRAM (TEE) (N/A) ENDOVEIN HARVEST OF GREATER SAPHENOUS VEIN (Right)  POD 28  Hemodynamically stable in sinus rhythm. Remains on low dose Midodrine.  -2188 cc for 24 hrs but recorded wt up 5 lbs. Obviously wts are not accurate.  Glucose under good control on SSI. 150's at times. He was not on any medication for DM preop but Hgb A1c was 6.2.  In and out caths for chronic bladder dysfunction. He was seeing a urologist for this preop and doing self caths at home for short while.  Concentrate on nutrition, ambulation and IS.  DC PICC line.  Plan SNF sometime next week.   LOS: 35 days    Gaye Pollack 06/20/2020

## 2020-06-20 NOTE — Progress Notes (Signed)
D/c PICC per MD Order. Pt tolerated well. Will continue to monitor the pt.   Lavenia Atlas, RN

## 2020-06-20 NOTE — Progress Notes (Signed)
Occupational Therapy Treatment Patient Details Name: [name] [name] MRN: [id number] DOB: February 20, 1928 Today's Date: 06/20/2020    History of present illness 86 yo male presenting to PCP with worsening LE edema and dyspnea. S/p right/left heart cath and coronary angiography on 11/22. S/p CABG x4 on 11/26. PMH including venous insufficiency with chronic lower extremity edema (mostly on the left), hypertension, hyperlipidemia, peripheral neuropathy, Bil TKA,  essential tremor, and prostate cancer.   OT comments  Pt calling out to return to bed d/t pain in buttock. Pt requesting to walk once OTA entered room. Pt able to complete functional mobility with RW and min guard assist ~ 100 ft with chair follow. Noted bleeding coming from pts rectum while ambulating in hallway, deferred further mobility and returned pt to room to have RN enter to assess site. Pt continues to present with decreased activity tolerance, generalized weakness and sternal precautions impacting pts ability to complete BADLs independently, continue to recommend SNF for DC, will follow acutely per POC.    Follow Up Recommendations  SNF    Equipment Recommendations  3 in 1 bedside commode;Wheelchair (measurements OT);Wheelchair cushion (measurements OT)    Recommendations for Other Services      Precautions / Restrictions Precautions Precautions: Fall;Sternal Precaution Booklet Issued: No Precaution Comments: cues for hand placement throughout session in order to maintain sternal precautions Restrictions Weight Bearing Restrictions: No Other Position/Activity Restrictions: sternal       Mobility Bed Mobility Overal bed mobility: Needs Assistance Bed Mobility: Rolling;Sit to Sidelying Rolling: Supervision       Sit to sidelying: Min assist General bed mobility comments: MIN A to lower trunk into sidelying and assist with elevating BLES back to bed, supervision to roll to supine from sidelying, however pt able to roll  R<>L to position pad with supervision  Transfers Overall transfer level: Needs assistance Equipment used: Rolling walker (2 wheeled) Transfers: Sit to/from Stand Sit to Stand: Mod assist;Min assist Stand pivot transfers: Min assist       General transfer comment: pt continues to present with posterior lean when attempting to stand but progressed to MIN A with increased reps and cues, pt continues to benefit from the use of momentum to power into standing from recliner d/t sternal precautions    Balance Overall balance assessment: Needs assistance Sitting-balance support: No upper extremity supported;Feet supported Sitting balance-Leahy Scale: Fair     Standing balance support: During functional activity Standing balance-Leahy Scale: Poor Standing balance comment: Requires UE support in standing.                           ADL either performed or assessed with clinical judgement   ADL Overall ADL's : Needs assistance/impaired                     Lower Body Dressing: Moderate assistance;Sitting/lateral leans Lower Body Dressing Details (indicate cue type and reason): to adjust socks from EOB Toilet Transfer: Moderate assistance;Min guard;RW;Ambulation Toilet Transfer Details (indicate cue type and reason): simulated via functional mobility with RW, MOD A to power into standing , min guard to ambulate to ~ 148ft with close chair follow         Functional mobility during ADLs: Min guard;Rolling walker (~100 ft with rw and chair follow) General ADL Comments: returned for second session as pt calling out requesting to return to bed d/t pain in buttock, pt requesting to walk once OTA entered room, assisted pt with  functional mobility with RW ~ 163ft, pt noted to be bleeding from buttock in hallway, returned to pt to sitting and returned pt to room to alert RN and have her assess     Vision       Perception     Praxis      Cognition Arousal/Alertness:  Awake/alert Behavior During Therapy: [facility] for tasks assessed/performed;Restless Overall Cognitive Status: Within Functional Limits for tasks assessed                                          Exercises Other Exercises Other Exercises: seated marches x10 from EOB Other Exercises: LAQ from EOB x10 reps   Shoulder Instructions       General Comments pt able to ambulate ~ 130ft with RW and min guard assist with close chair follow ; VSS noted bleeding coming from pts rectum while ambulating in hallway, deferred further mobility and returned pt to room to have RN come assess rectum    Pertinent Vitals/ Pain       Pain Assessment: Faces Faces Pain Scale: Hurts even more Pain Location: buttock Pain Descriptors / Indicators: Constant;Pressure Pain Intervention(s): Limited activity within patient's tolerance;Monitored during session;Repositioned;Other (comment) (RN change sacral dressing)  Home Living                                          Prior Functioning/Environment              Frequency  Min 2X/week        Progress Toward Goals  OT Goals(current goals can now be found in the care plan section)  Progress towards OT goals: Progressing toward goals  Acute Rehab OT Goals Patient Stated Goal: To get back to bed and eat lunch OT Goal Formulation: With patient Time For Goal Achievement: 06/23/20 Potential to Achieve Goals: Good  Plan Discharge plan remains appropriate;Frequency remains appropriate    Co-evaluation                 AM-PAC OT "6 Clicks" Daily Activity     Outcome Measure   Help from another person eating meals?: None Help from another person taking care of personal grooming?: A Little Help from another person toileting, which includes using toliet, bedpan, or urinal?: A Lot Help from another person bathing (including washing, rinsing, drying)?: A Lot Help from another person to put on and taking off regular upper  body clothing?: A Little Help from another person to put on and taking off regular lower body clothing?: A Lot 6 Click Score: 16    End of Session Equipment Utilized During Treatment: Gait belt;Rolling walker  OT Visit Diagnosis: Unsteadiness on feet (R26.81);Other abnormalities of gait and mobility (R26.89);Muscle weakness (generalized) (M62.81);Pain   Activity Tolerance Patient tolerated treatment well   Patient Left in bed;with call bell/phone within reach;with nursing/sitter in room   Nurse Communication Mobility status        Time: 1010-1033 OT Time Calculation (min): 23 min  Charges: OT General Charges $OT Visit: 1 Visit OT Treatments  $Therapeutic Activity: 23-37 mins  Lanier Clam., COTA/L Acute Rehabilitation Services [phone]    Ihor Gully 06/20/2020, 12:50 PM

## 2020-06-20 NOTE — Progress Notes (Signed)
Occupational Therapy Treatment Patient Details Name: [name] [name] MRN: [id number] DOB: September 27, 1927 Today's Date: 06/20/2020    History of present illness 86 yo male presenting to PCP with worsening LE edema and dyspnea. S/p right/left heart cath and coronary angiography on 11/22. S/p CABG x4 on 11/26. PMH including venous insufficiency with chronic lower extremity edema (mostly on the left), hypertension, hyperlipidemia, peripheral neuropathy, Bil TKA,  essential tremor, and prostate cancer.   OT comments  Pt making steady progress towards OT goals this session. Session focus on functional mobility and BADL reeducation in compliance with sternal precautions. Pt required up to MOD A for bed mobility needing cues for sequencing in order to maintain sternal precautions. Pt required to MOD A to stand d/t heavy posterior lean. MIN A to pivot tor recliner with RW, education provided on BADL reeducation while maintaining sternal precautions even though pt declined BADL participation. Pt continues to perseverate on not "getting better" offered encouragement and challenged pt to stay up in chair for lunch. Pt would continue to benefit from skilled occupational therapy while admitted and after d/c to address the below listed limitations in order to improve overall functional mobility and facilitate independence with BADL participation. DC plan remains appropriate, will follow acutely per POC.     Follow Up Recommendations  SNF    Equipment Recommendations  3 in 1 bedside commode;Wheelchair (measurements OT);Wheelchair cushion (measurements OT)    Recommendations for Other Services      Precautions / Restrictions Precautions Precautions: Fall;Sternal Precaution Booklet Issued: No Precaution Comments: Reviewed sternal precautions, pt reports " stay in the barrel" Restrictions Weight Bearing Restrictions: No Other Position/Activity Restrictions: sternal       Mobility Bed Mobility Overal bed  mobility: Needs Assistance Bed Mobility: Rolling;Sit to Sidelying Rolling: Min assist       Sit to sidelying: Mod assist General bed mobility comments: light MIN A to roll to pts R side needing cues for body mechanics and MOD A to elevate trunk into sitting  Transfers Overall transfer level: Needs assistance Equipment used: Rolling walker (2 wheeled) Transfers: Sit to/from [facility] Sit to Stand: Mod assist Stand pivot transfers: Min assist       General transfer comment: hevy MOD A to power into standing while pt holding heart pillow presenting with heavy posterior lean, pt seems to benefit from use of momentum to power into standing, MIN A to pivot to recliner with RW needing cues for safey and rw mgmt    Balance Overall balance assessment: Needs assistance Sitting-balance support: No upper extremity supported;Feet supported Sitting balance-Leahy Scale: Fair     Standing balance support: During functional activity Standing balance-Leahy Scale: Poor Standing balance comment: Requires UE support in standing.                           ADL either performed or assessed with clinical judgement   ADL Overall ADL's : Needs assistance/impaired                     Lower Body Dressing: Moderate assistance;Sitting/lateral leans Lower Body Dressing Details (indicate cue type and reason): to adjust socks from EOB Toilet Transfer: Minimal assistance;RW;Stand-pivot;Moderate assistance Toilet Transfer Details (indicate cue type and reason): simulated via functional mobility; MOD A for sit<>stand from EOB d/t heavy posterior lean, MIN A to pivot to recliner needing assist for RW mgmt and for cues for body mechanics related to maintaining sternal precautions  Functional mobility during ADLs: Minimal assistance;Rolling walker;Moderate assistance (stand pivot only) General ADL Comments: pt presents with increased pain in buttocks, sternal  precautions and generalized impacting pts ability to complete BADLS independently     Vision       Perception     Praxis      Cognition Arousal/Alertness: Awake/alert Behavior During Therapy: WFL for tasks assessed/performed Overall Cognitive Status: Within Functional Limits for tasks assessed                                          Exercises Other Exercises Other Exercises: seated marches x10 from EOB Other Exercises: LAQ from EOB x10 reps   Shoulder Instructions       General Comments pt reprots feeling dizzy upon initial stand, BP taken from chair 132/91 O2 93% on RA with HR 89 bpm    Pertinent Vitals/ Pain       Pain Assessment: No/denies pain  Home Living                                          Prior Functioning/Environment              Frequency  Min 2X/week        Progress Toward Goals  OT Goals(current goals can now be found in the care plan section)  Progress towards OT goals: Progressing toward goals  Acute Rehab OT Goals Patient Stated Goal: To get back to bed and eat lunch OT Goal Formulation: With patient Time For Goal Achievement: 06/23/20 Potential to Achieve Goals: Good  Plan Discharge plan remains appropriate;Frequency remains appropriate    Co-evaluation                 AM-PAC OT "6 Clicks" Daily Activity     Outcome Measure   Help from another person eating meals?: None Help from another person taking care of personal grooming?: A Little Help from another person toileting, which includes using toliet, bedpan, or urinal?: A Lot Help from another person bathing (including washing, rinsing, drying)?: A Lot Help from another person to put on and taking off regular upper body clothing?: A Little Help from another person to put on and taking off regular lower body clothing?: A Lot 6 Click Score: 16    End of Session Equipment Utilized During Treatment: Gait belt;Rolling walker  OT  Visit Diagnosis: Unsteadiness on feet (R26.81);Other abnormalities of gait and mobility (R26.89);Muscle weakness (generalized) (M62.81);Pain   Activity Tolerance Patient tolerated treatment well   Patient Left in chair;with call bell/phone within reach;with chair alarm set   Nurse Communication Mobility status        Time: 1779-3903 OT Time Calculation (min): 21 min  Charges: OT General Charges $OT Visit: 1 Visit OT Treatments $Self Care/Home Management : 8-22 mins  Lanier Clam., COTA/L Acute Rehabilitation Services [phone] [place] 06/20/2020, 12:21 PM

## 2020-06-21 LAB — GLUCOSE, CAPILLARY
Glucose-Capillary: 107 mg/dL — ABNORMAL HIGH (ref 70–99)
Glucose-Capillary: 164 mg/dL — ABNORMAL HIGH (ref 70–99)
Glucose-Capillary: 167 mg/dL — ABNORMAL HIGH (ref 70–99)
Glucose-Capillary: 170 mg/dL — ABNORMAL HIGH (ref 70–99)
Glucose-Capillary: 93 mg/dL (ref 70–99)
Glucose-Capillary: 98 mg/dL (ref 70–99)

## 2020-06-21 NOTE — Progress Notes (Addendum)
29 Days Post-Op Procedure(s) (LRB): AORTIC VALVE REPLACEMENT (AVR) USING EDWARDS RESILIA 29 MM AORTIC VALVE. (N/A) CORONARY ARTERY BYPASS GRAFTING (CABG) USING LIMA to LAD; ENDOSCOPIC HARVESTED RIGHT GREATER SAPHENOUS VEIN: SVG to PD; SVG to SEQUENCED INTERMEDIATE to OM (N/A) TRANSESOPHAGEAL ECHOCARDIOGRAM (TEE) (N/A) ENDOVEIN HARVEST OF GREATER SAPHENOUS VEIN (Right) Subjective:  Sleepng in bed, awakened easily. Said he only slept about a hour and a half last night. No new concerns.  Required I&O cath last evening was self-catheterizing routinely prior to admission.    Objective: Vital signs in last 24 hours: Temp:  [97.6 F (36.4 C)-99.2 F (37.3 C)] 97.6 F (36.4 C) (12/25 0741) Pulse Rate:  [80-88] 84 (12/25 0741) Cardiac Rhythm: Heart block;Bundle branch block (12/25 0838) Resp:  [18-20] 20 (12/25 0741) BP: (112-125)/(74-87) 115/74 (12/25 0741) SpO2:  [93 %-97 %] 97 % (12/25 0741) Weight:  [78 kg] 78 kg (12/25 0500)  Hemodynamic parameters for last 24 hours:    Intake/Output from previous day: 12/24 0701 - 12/25 0700 In: 680 [P.O.:680] Out: 2525 [Urine:2525] Intake/Output this shift: No intake/output data recorded.  General appearance: alert and cooperative Neurologic: intact Heart: regular rate and rhythm Lungs: clear to auscultation bilaterally Extremities: no edema Wound: incision healing well  Lab Results: Recent Labs    06/19/20 0500  WBC 5.7  HGB 9.2*  HCT 30.3*  PLT 165   BMET:  Recent Labs    06/19/20 0500  NA 142  K 4.8  CL 105  CO2 30  GLUCOSE 104*  BUN 37*  CREATININE 1.12  CALCIUM 8.6*    PT/INR: No results for input(s): LABPROT, INR in the last 72 hours. ABG    Component Value Date/Time   PHART 7.381 05/26/2020 1750   HCO3 22.2 05/26/2020 1750   TCO2 23 05/26/2020 1750   ACIDBASEDEF 2.0 05/26/2020 1750   O2SAT 64.7 06/16/2020 0320   CBG (last 3)  Recent Labs    06/20/20 2335 06/21/20 0528 06/21/20 0736  GLUCAP 124* 93 98     Assessment/Plan: S/P Procedure(s) (LRB): AORTIC VALVE REPLACEMENT (AVR) USING EDWARDS RESILIA 29 MM AORTIC VALVE. (N/A) CORONARY ARTERY BYPASS GRAFTING (CABG) USING LIMA to LAD; ENDOSCOPIC HARVESTED RIGHT GREATER SAPHENOUS VEIN: SVG to PD; SVG to SEQUENCED INTERMEDIATE to OM (N/A) TRANSESOPHAGEAL ECHOCARDIOGRAM (TEE) (N/A) ENDOVEIN HARVEST OF GREATER SAPHENOUS VEIN (Right)  POD 29 Cabg X 4 and bioprosthetic AVR  -Hemodynamically stable in sinus rhythm. Remains on low dose Midodrine, SBP stable in the 110-120 range.  -I&O -1800 cc for 24 hrs accuracy of Wt's in question since transfer.   -Glucose 90-230 past 24 hours He was not on any medication for DM preop but Hgb A1c was 6.2.  -Urinary retention--In and out caths for chronic bladder dysfunction. He was seeing a urologist for this preop and doing self caths at home for short while.  -PT/OT recommending SNF. Plan transition sometime next week.   LOS: 36 days    Malon Kindle [phone] 06/21/2020   Chart reviewed, patient examined, agree with above. Says he needs to be on oxygen at night when sleeping or else he keeps waking up. His wife thinks he has sleep apnea but never has been on CPAP. Will order oxygen for hs to see if this helps him sleep. He seems to have much better days if he sleeps well overnight.

## 2020-06-22 LAB — GLUCOSE, CAPILLARY
Glucose-Capillary: 105 mg/dL — ABNORMAL HIGH (ref 70–99)
Glucose-Capillary: 109 mg/dL — ABNORMAL HIGH (ref 70–99)
Glucose-Capillary: 111 mg/dL — ABNORMAL HIGH (ref 70–99)
Glucose-Capillary: 133 mg/dL — ABNORMAL HIGH (ref 70–99)
Glucose-Capillary: 143 mg/dL — ABNORMAL HIGH (ref 70–99)
Glucose-Capillary: 155 mg/dL — ABNORMAL HIGH (ref 70–99)

## 2020-06-22 NOTE — Progress Notes (Addendum)
30 Days Post-Op Procedure(s) (LRB): AORTIC VALVE REPLACEMENT (AVR) USING EDWARDS RESILIA 29 MM AORTIC VALVE. (N/A) CORONARY ARTERY BYPASS GRAFTING (CABG) USING [place]A to LAD; ENDOSCOPIC HARVESTED RIGHT GREATER SAPHENOUS VEIN: SVG to PD; SVG to SEQUENCED INTERMEDIATE to OM (N/A) TRANSESOPHAGEAL ECHOCARDIOGRAM (TEE) (N/A) ENDOVEIN HARVEST OF GREATER SAPHENOUS VEIN (Right) Subjective:  Sitting up in the bedside chair eating breakfast with RN assisting.  Says he rested better with the oxygen but still is not satisfied with the amount of time he is able to sleep at night.  He is walking short distances with assistance..    Objective: Vital signs in last 24 hours: Temp:  [98 F (36.7 C)-98.7 F (37.1 C)] 98.5 F (36.9 C) (12/26 0743) Pulse Rate:  [60-84] 84 (12/26 0743) Cardiac Rhythm: Normal sinus rhythm;Heart block (12/26 0744) Resp:  [19-20] 20 (12/26 0743) BP: (106-128)/(66-86) 128/86 (12/26 0743) SpO2:  [94 %-98 %] 98 % (12/26 0743) Weight:  [77.6 kg] 77.6 kg (12/26 1023)    Intake/Output from previous day: 12/25 0701 - 12/26 0700 In: 977 [P.O.:977] Out: 1450 [Urine:1450] Intake/Output this shift: Total I/O In: 240 [P.O.:240] Out: -   General appearance: alert and cooperative Neurologic: intact Heart: regular rate and rhythm, Monitor shows SR with first degree AB block Lungs: clear to auscultation bilaterally Extremities: no edema Wound: incision healing well  Lab Results: No results for input(s): WBC, HGB, HCT, PLT in the last 72 hours. BMET:  No results for input(s): NA, K, CL, CO2, GLUCOSE, BUN, CREATININE, CALCIUM in the last 72 hours.  PT/INR: No results for input(s): LABPROT, INR in the last 72 hours. ABG    Component Value Date/Time   PHART 7.381 05/26/2020 1750   HCO3 22.2 05/26/2020 1750   TCO2 23 05/26/2020 1750   ACIDBASEDEF 2.0 05/26/2020 1750   O2SAT 64.7 06/16/2020 0320   CBG (last 3)  Recent Labs    06/21/20 2348 06/22/20 0355 06/22/20 0740   GLUCAP 167* 109* 111*    Assessment/Plan: S/P Procedure(s) (LRB): AORTIC VALVE REPLACEMENT (AVR) USING EDWARDS RESILIA 29 MM AORTIC VALVE. (N/A) CORONARY ARTERY BYPASS GRAFTING (CABG) USING LIMA to LAD; ENDOSCOPIC HARVESTED RIGHT GREATER SAPHENOUS VEIN: SVG to PD; SVG to SEQUENCED INTERMEDIATE to OM (N/A) TRANSESOPHAGEAL ECHOCARDIOGRAM (TEE) (N/A) ENDOVEIN HARVEST OF GREATER SAPHENOUS VEIN (Right)  POD 30 Cabg X 4 and bioprosthetic AVR  -Hemodynamically stable in sinus rhythm with 1st degree AVB. Remains on low dose Midodrine, SBP stable in the 110-120 range.  -Post-op A-fib- remains in SR not on antiarrhythmic or BB (due to soft BP)  -Glucose 109-167 past 24 hours He was not on any medication for DM preop but Hgb A1c was 6.2.  -Urinary retention--In and out caths for chronic bladder dysfunction. He was seeing a urologist for this preop and doing self caths at home for short while.  -PT/OT recommending SNF. Plan transition sometime next week.   LOS: 37 days    Antony Odea, [place] [phone] 06/22/2020 .  Chart reviewed, patient examined, agree with above. He is doing well overall and I think can go to SNF at [facility] when bed available this week.

## 2020-06-22 NOTE — Plan of Care (Signed)
  Problem: Education: Goal: Knowledge of disease and its progression will improve Outcome: Progressing   Problem: Fluid Volume: Goal: Compliance with measures to maintain balanced fluid volume will improve Outcome: Progressing   Problem: Education: Goal: Knowledge of General Education information will improve Description: Including pain rating scale, medication(s)/side effects and non-pharmacologic comfort measures Outcome: Progressing   Problem: Health Behavior/Discharge Planning: Goal: Ability to manage health-related needs will improve Outcome: Progressing   Problem: Clinical Measurements: Goal: Ability to maintain clinical measurements within normal limits will improve Outcome: Progressing Goal: Will remain free from infection Outcome: Progressing Goal: Diagnostic test results will improve Outcome: Progressing Goal: Respiratory complications will improve Outcome: Progressing Goal: Cardiovascular complication will be avoided Outcome: Progressing   Problem: Activity: Goal: Risk for activity intolerance will decrease Outcome: Progressing   Problem: Nutrition: Goal: Adequate nutrition will be maintained Outcome: Progressing   Problem: Coping: Goal: Level of anxiety will decrease Outcome: Progressing   Problem: Elimination: Goal: Will not experience complications related to bowel motility Outcome: Progressing Goal: Will not experience complications related to urinary retention Outcome: Progressing   Problem: Skin Integrity: Goal: Risk for impaired skin integrity will decrease Outcome: Progressing   Problem: Education: Goal: Ability to demonstrate management of disease process will improve Outcome: Progressing Goal: Ability to verbalize understanding of medication therapies will improve Outcome: Progressing Goal: Individualized Educational Video(s) Outcome: Progressing   Problem: Activity: Goal: Capacity to carry out activities will improve Outcome:  Progressing   Problem: Cardiac: Goal: Ability to achieve and maintain adequate cardiopulmonary perfusion will improve Outcome: Progressing   Problem: Education: Goal: Will demonstrate proper wound care and an understanding of methods to prevent future damage Outcome: Progressing Goal: Knowledge of disease or condition will improve Outcome: Progressing Goal: Knowledge of the prescribed therapeutic regimen will improve Outcome: Progressing Goal: Individualized Educational Video(s) Outcome: Progressing   Problem: Activity: Goal: Risk for activity intolerance will decrease Outcome: Progressing   Problem: Cardiac: Goal: Will achieve and/or maintain hemodynamic stability Outcome: Progressing   Problem: Clinical Measurements: Goal: Postoperative complications will be avoided or minimized Outcome: Progressing   Problem: Respiratory: Goal: Respiratory status will improve Outcome: Progressing   Problem: Skin Integrity: Goal: Wound healing without signs and symptoms of infection Outcome: Progressing Goal: Risk for impaired skin integrity will decrease Outcome: Progressing

## 2020-06-22 NOTE — Progress Notes (Signed)
I/o cath completed with 300 cc urine output. Pt tolerated well, keeps complaining of insomnia besides bedtime meds and Prn. Will continue to monitor.

## 2020-06-22 NOTE — Progress Notes (Signed)
Mobility Specialist: Progress Note   06/22/20 1210  Mobility  Activity Ambulated in hall  Level of Assistance Moderate assist, patient does 50-74%  Assistive Device Four wheel walker  Distance Ambulated (ft) 470 ft  Mobility Response Tolerated fair  Mobility performed by Mobility specialist  Bed Position Chair  $Mobility charge 1 Mobility   Pre-Mobility on 1 1/2 L/min [place]: 76 HR, 106/77 BP, 98% SpO2 Post-Mobility on 1 1/2 L/min [place]: 88 HR, 145/86 BP, 100% SpO2  Pt c/o dizziness upon standing, quickly resolved. Pt ambulated on 2 L/min [place]. I was unable to get pulse oximeter reading during walk. Pt stopped to take several standing rest breaks during ambulation as well as one seated halfway through due to feeling SOB. Standing breaks lasted <30 seconds and seated break lasted 1-2 minutes. Emphasized and explained purse lip breathing to pt for his SOB.   [facility] Mya Suell Mobility Specialist

## 2020-06-23 ENCOUNTER — Inpatient Hospital Stay (HOSPITAL_COMMUNITY): Payer: Medicare HMO

## 2020-06-23 LAB — MAGNESIUM: Magnesium: 2.2 mg/dL (ref 1.7–2.4)

## 2020-06-23 LAB — BASIC METABOLIC PANEL
Anion gap: 8 (ref 5–15)
BUN: 28 mg/dL — ABNORMAL HIGH (ref 8–23)
CO2: 31 mmol/L (ref 22–32)
Calcium: 8.7 mg/dL — ABNORMAL LOW (ref 8.9–10.3)
Chloride: 101 mmol/L (ref 98–111)
Creatinine, Ser: 1.21 mg/dL (ref 0.61–1.24)
GFR, Estimated: 57 mL/min — ABNORMAL LOW (ref >60)
Glucose, Bld: 104 mg/dL — ABNORMAL HIGH (ref 70–99)
Potassium: 4.7 mmol/L (ref 3.5–5.1)
Sodium: 140 mmol/L (ref 135–145)

## 2020-06-23 LAB — CBC
HCT: 32.5 % — ABNORMAL LOW (ref 39.0–52.0)
Hemoglobin: 10.3 g/dL — ABNORMAL LOW (ref 13.0–17.0)
MCH: 30.6 pg (ref 26.0–34.0)
MCHC: 31.7 g/dL (ref 30.0–36.0)
MCV: 96.4 fL (ref 80.0–100.0)
Platelets: 187 10*3/uL (ref 150–400)
RBC: 3.37 MIL/uL — ABNORMAL LOW (ref 4.22–5.81)
RDW: 14.8 % (ref 11.5–15.5)
WBC: 5.6 10*3/uL (ref 4.0–10.5)
nRBC: 0 % (ref 0.0–0.2)

## 2020-06-23 LAB — GLUCOSE, CAPILLARY
Glucose-Capillary: 106 mg/dL — ABNORMAL HIGH (ref 70–99)
Glucose-Capillary: 115 mg/dL — ABNORMAL HIGH (ref 70–99)

## 2020-06-23 MED ORDER — DM-GUAIFENESIN ER 30-600 MG PO TB12
1.0000 | ORAL_TABLET | Freq: Two times a day (BID) | ORAL | Status: DC
  Administered 2020-06-23 – 2020-06-26 (×7): 1 via ORAL
  Filled 2020-06-23 (×7): qty 1

## 2020-06-23 NOTE — Progress Notes (Signed)
Occupational Therapy Treatment Patient Details Name: [name] [name] MRN: [id number] DOB: 1927/12/16 Today's Date: 06/23/2020    History of present illness 86 yo male presenting to PCP with worsening LE edema and dyspnea. S/p right/left heart cath and coronary angiography on 11/22. S/p CABG x4 on 11/26. PMH including venous insufficiency with chronic lower extremity edema (mostly on the left), hypertension, hyperlipidemia, peripheral neuropathy, Bil TKA,  essential tremor, and prostate cancer.   OT comments  Patient transitioned to a regular floor and making nice progress towards his goals.  Patient is demonstrating improved activity tolerance and mobility with a RW, which in turn is increasing his ADL status from a sit/stand level.  Deficits are listed below, OT will continue to follow in the acute setting.  OT continues to recommend SNF post acute.    Follow Up Recommendations  SNF    Equipment Recommendations  3 in 1 bedside commode;Wheelchair (measurements OT);Wheelchair cushion (measurements OT)    Recommendations for Other Services      Precautions / Restrictions Precautions Precautions: Fall;Sternal Precaution Booklet Issued: No Precaution Comments: cues for hand placement throughout session in order to maintain sternal precautions Restrictions Other Position/Activity Restrictions: sternal       Mobility Bed Mobility Overal bed mobility: Needs Assistance Bed Mobility: Sit to Supine Rolling: Supervision Sidelying to sit: Min guard   Sit to supine: Min guard   General bed mobility comments: supervision to roll to supine from sidelying, min guard for coming to sitting to follow precautions  Transfers Overall transfer level: Needs assistance Equipment used: Rolling walker (2 wheeled) Transfers: Sit to/from [facility] Sit to Stand: Min assist Stand pivot transfers: Min guard       General transfer comment: assist with managing lines.    Balance  Overall balance assessment: Needs assistance Sitting-balance support: No upper extremity supported;Feet supported Sitting balance-Leahy Scale: Fair Sitting balance - Comments: Supervision for safety. Postural control: Posterior lean Standing balance support: During functional activity;Bilateral upper extremity supported Standing balance-Leahy Scale: Poor Standing balance comment: Requires UE support in standing.                           ADL either performed or assessed with clinical judgement   ADL                           Toilet Transfer: Min guard;RW;Ambulation   Toileting- Clothing Manipulation and Hygiene: Minimal assistance;Sit to/from stand       Functional mobility during ADLs: Min guard;Rolling walker                         Cognition Arousal/Alertness: Awake/alert Behavior During Therapy: WFL for tasks assessed/performed;Restless Overall Cognitive Status: Within Functional Limits for tasks assessed                                          Exercises Exercises: General Lower Extremity;General Upper Extremity General Exercises - Lower Extremity Ankle Circles/Pumps: AROM;Both;10 reps;Supine Long Arc Quad: Both;10 reps;Seated;AROM Hip Flexion/Marching: 10 reps;Seated;Both;AROM   Shoulder Instructions       General Comments      Pertinent Vitals/ Pain       Pain Assessment: No/denies pain  Frequency  Min 2X/week        Progress Toward Goals  OT Goals(current goals can now be found in the care plan section)  Progress towards OT goals: Progressing toward goals  Acute Rehab OT Goals OT Goal Formulation: With patient Time For Goal Achievement: 07/07/20 Potential to Achieve Goals: Good  Plan Discharge plan remains appropriate;Frequency remains appropriate    Co-evaluation                 AM-PAC OT "6 Clicks" Daily  Activity     Outcome Measure   Help from another person eating meals?: None Help from another person taking care of personal grooming?: A Little Help from another person toileting, which includes using toliet, bedpan, or urinal?: A Little Help from another person bathing (including washing, rinsing, drying)?: A Little Help from another person to put on and taking off regular upper body clothing?: A Little Help from another person to put on and taking off regular lower body clothing?: A Lot 6 Click Score: 18    End of Session Equipment Utilized During Treatment: Gait belt;Rolling walker;Oxygen  OT Visit Diagnosis: Unsteadiness on feet (R26.81);Other abnormalities of gait and mobility (R26.89);Muscle weakness (generalized) (M62.81);Pain   Activity Tolerance Patient tolerated treatment well   Patient Left in bed;with call bell/phone within reach;with nursing/sitter in room   Nurse Communication          Time: 1210-1225 OT Time Calculation (min): 15 min  Charges: OT General Charges $OT Visit: 1 Visit OT Treatments $Self Care/Home Management : 8-22 mins  06/23/2020  Rich, OTR/L  Acute Rehabilitation Services  Office:  [phone]    Metta Clines 06/23/2020, 1:51 PM

## 2020-06-23 NOTE — Progress Notes (Signed)
CARDIAC REHAB PHASE I   PRE:  Rate/Rhythm: 79 afib  BP:  Supine: 112/83  Sitting:   Standing:    SaO2: 95% RA  On 1L but prongs not in nose  MODE:  Ambulation: 250 ft   POST:  Rate/Rhythm: 95 afib                     Bathroom for a while BP:     SaO2: 88%RA in hall,  91% 2L 1400-1500 Pt walked 250 ft with gait belt use, rollator, and asst x 1. Started out on RA but had to increase to 2L to keep sats above 90%. Pt took two standing rest breaks before he sat to rest. He sat for about five minutes to rest. Pt then walked back to room and to bathroom.  Assisted from bathroom to bed. Call bell in reach. Pt stated he did better this morning. Encouraged another walk with Mobility specialist. Left on 1L.    Graylon Good, RN BSN  06/23/2020 2:54 PM

## 2020-06-23 NOTE — Progress Notes (Signed)
Mobility Specialist - Progress Note   06/23/20 1613  Mobility  Activity Ambulated to bathroom  Level of Assistance Minimal assist, patient does 75% or more  Assistive Device Four wheel walker  Distance Ambulated (ft) 10 ft  Mobility Response Tolerated well  Mobility performed by Mobility specialist  $Mobility charge 1 Mobility    Pre-mobility: 80 HR, 95% SpO2 Post-mobility: 85 HR, 96% SpO2  Ambulated to bathroom on 2L O2 in order to have a BM. Pt unable to tell when he was going. Unable to progress to hallway as pt got some BM on himself and his gown, NT came to clean him. Some blood was on the toilet seat when he stood, will notify RN.   Pricilla Handler Mobility Specialist Mobility Specialist Phone: [phone]

## 2020-06-23 NOTE — Progress Notes (Signed)
CSW contacted [place]; Auth still pending

## 2020-06-23 NOTE — Progress Notes (Signed)
Physical Therapy Treatment Patient Details Name: [name] [name] MRN: [id number] DOB: 1928/06/27 Today's Date: 06/23/2020    History of Present Illness 86 yo male presenting to PCP with worsening LE edema and dyspnea. S/p right/left heart cath and coronary angiography on 11/22. S/p CABG x4 on 11/26. PMH including venous insufficiency with chronic lower extremity edema (mostly on the left), hypertension, hyperlipidemia, peripheral neuropathy, Bil TKA,  essential tremor, and prostate cancer.    PT Comments    Pt admitted with above diagnosis. Pt was able to ambulate with rollator and incr distance today with min assist for gait and 1 seated rest break.  Had several standing rest breaks.  Pt met 4/4 goals therefore revised goals today.  Pt currently with functional limitations due to balance and endurance deficits. Pt will benefit from skilled PT to increase their independence and safety with mobility to allow discharge to the venue listed below.     Follow Up Recommendations  SNF     Equipment Recommendations  None recommended by PT    Recommendations for Other Services       Precautions / Restrictions Precautions Precautions: Fall;Sternal Precaution Booklet Issued: No Precaution Comments: cues for hand placement throughout session in order to maintain sternal precautions Restrictions Other Position/Activity Restrictions: sternal    Mobility  Bed Mobility Overal bed mobility: Needs Assistance Bed Mobility: Rolling;Sidelying to Sit Rolling: Supervision Sidelying to sit: Min guard       General bed mobility comments: supervision to roll to supine from sidelying, min guard for coming to sitting to follow precautions  Transfers Overall transfer level: Needs assistance Equipment used: Rolling walker (2 wheeled) Transfers: Sit to/from Stand Sit to Stand: Min assist;From elevated surface         General transfer comment: pt continues to present with posterior lean when  attempting to stand but progressed to MIN A for power up, pt continues to benefit from the use of momentum to power into standing with cues to follow sternal precautions  Ambulation/Gait Ambulation/Gait assistance: Min assist Gait Distance (Feet): 400 Feet Assistive device: 4-wheeled walker Gait Pattern/deviations: Step-through pattern;Trunk flexed;Decreased stride length;Wide base of support;Drifts right/left Gait velocity: decreased Gait velocity interpretation: <1.31 ft/sec, indicative of household ambulator General Gait Details: Cues for sequencing and safety with turns and backing.  Took multiple standing rest breaks.  Also took 1 sitting rest break.Sats 88% and > on 4LO2 with cues for pursed lip breathing.   Stairs             Wheelchair Mobility    Modified Rankin (Stroke Patients Only)       Balance Overall balance assessment: Needs assistance Sitting-balance support: No upper extremity supported;Feet supported Sitting balance-Leahy Scale: Fair Sitting balance - Comments: Supervision for safety. Postural control: Posterior lean Standing balance support: During functional activity;Bilateral upper extremity supported Standing balance-Leahy Scale: Poor Standing balance comment: Requires UE support in standing.                            Cognition Arousal/Alertness: Awake/alert Behavior During Therapy: WFL for tasks assessed/performed;Restless Overall Cognitive Status: Within Functional Limits for tasks assessed                                        Exercises General Exercises - Lower Extremity Ankle Circles/Pumps: AROM;Both;10 reps;Supine Long Arc Quad: Both;10 reps;Seated;AROM Hip Flexion/Marching: 10 reps;Seated;Both;AROM  General Comments        Pertinent Vitals/Pain Pain Assessment: No/denies pain    Home Living                      Prior Function            PT Goals (current goals can now be found in the  care plan section) Acute Rehab PT Goals PT Goal Formulation: With patient Time For Goal Achievement: 07/07/20 Potential to Achieve Goals: Fair Progress towards PT goals: Progressing toward goals    Frequency    Min 2X/week      PT Plan Current plan remains appropriate    Co-evaluation PT/OT/SLP Co-Evaluation/Treatment: Yes            AM-PAC PT "6 Clicks" Mobility   Outcome Measure  Help needed turning from your back to your side while in a flat bed without using bedrails?: A Little Help needed moving from lying on your back to sitting on the side of a flat bed without using bedrails?: A Lot Help needed moving to and from a bed to a chair (including a wheelchair)?: A Lot Help needed standing up from a chair using your arms (e.g., wheelchair or bedside chair)?: A Lot Help needed to walk in hospital room?: A Little Help needed climbing 3-5 steps with a railing? : A Little 6 Click Score: 15    End of Session Equipment Utilized During Treatment: Gait belt;Oxygen Activity Tolerance: Patient limited by fatigue Patient left: with call bell/phone within reach;in chair;with chair alarm set Nurse Communication: Mobility status PT Visit Diagnosis: Muscle weakness (generalized) (M62.81);Difficulty in walking, not elsewhere classified (R26.2);Other abnormalities of gait and mobility (R26.89)     Time: 0379-4446 PT Time Calculation (min) (ACUTE ONLY): 34 min  Charges:  $Gait Training: 8-22 mins $Therapeutic Exercise: 8-22 mins                     [name] [name] W,PT Acute Rehabilitation Services Pager:  [phone]  Office:  [place] 06/23/2020, 1:38 PM

## 2020-06-23 NOTE — Progress Notes (Addendum)
[address]       [place] [address]             [phone]      31 Days Post-Op Procedure(s) (LRB): AORTIC VALVE REPLACEMENT (AVR) USING EDWARDS RESILIA 29 MM AORTIC VALVE. (N/A) CORONARY ARTERY BYPASS GRAFTING (CABG) USING [place] to LAD; ENDOSCOPIC HARVESTED RIGHT GREATER SAPHENOUS VEIN: SVG to PD; SVG to SEQUENCED INTERMEDIATE to OM (N/A) TRANSESOPHAGEAL ECHOCARDIOGRAM (TEE) (N/A) ENDOVEIN HARVEST OF GREATER SAPHENOUS VEIN (Right) Subjective: More upper airway congestion this am, feels somewhat SOB  Objective: Vital signs in last 24 hours: Temp:  [97.6 F (36.4 C)-99 F (37.2 C)] 97.6 F (36.4 C) (12/27 0341) Pulse Rate:  [73-85] 80 (12/27 0341) Cardiac Rhythm: Normal sinus rhythm;Bundle branch block;Heart block (12/26 2230) Resp:  [17-20] 20 (12/27 0341) BP: (106-145)/(73-86) 127/79 (12/27 0341) SpO2:  [91 %-98 %] 96 % (12/27 0341) Weight:  [76 kg-77.6 kg] 76 kg (12/27 0500)  Hemodynamic parameters for last 24 hours:    Intake/Output from previous day: 12/26 0701 - 12/27 0700 In: 840 [P.O.:840] Out: 1600 [Urine:1600] Intake/Output this shift: No intake/output data recorded.  General appearance: alert, cooperative, distracted and fatigued Heart: irregularly irregular rhythm Lungs: upper airway ronchi Abdomen: benign Extremities: no edema Wound: incis healing well  Lab Results: No results for input(s): WBC, HGB, HCT, PLT in the last 72 hours. BMET: No results for input(s): NA, K, CL, CO2, GLUCOSE, BUN, CREATININE, CALCIUM in the last 72 hours.  PT/INR: No results for input(s): LABPROT, INR in the last 72 hours. ABG    Component Value Date/Time   PHART 7.381 05/26/2020 1750   HCO3 22.2 05/26/2020 1750   TCO2 23 05/26/2020 1750   ACIDBASEDEF 2.0 05/26/2020 1750   O2SAT 64.7 06/16/2020 0320   CBG (last 3)  Recent Labs    06/22/20 1945 06/22/20 2341 06/23/20 0345  GLUCAP 143* 105* 106*    Meds Scheduled Meds: . acetaminophen   650 mg Oral TID  . ALPRAZolam  0.25 mg Oral QHS  . antiseptic oral rinse  15 mL Mouth Rinse TID  . aspirin EC  81 mg Oral Daily  . Chlorhexidine Gluconate Cloth  6 each Topical Daily  . docusate sodium  200 mg Oral Daily  . feeding supplement  237 mL Oral TID BM  . ferrous [id number] C-folic acid  1 capsule Oral BID PC  . furosemide  40 mg Oral Daily  . insulin aspart  0-15 Units Subcutaneous TID WC  . mouth rinse  15 mL Mouth Rinse BID  . midodrine  5 mg Oral TID  . mirtazapine  7.5 mg Oral QHS  . multivitamin with minerals  1 tablet Oral Daily  . pantoprazole  40 mg Oral Daily  . senna  1 tablet Oral Daily  . tamsulosin  0.4 mg Oral Daily   Continuous Infusions: . sodium chloride Stopped (06/08/20 0941)   PRN Meds:.sodium chloride, bisacodyl, Gerhardt's butt cream, melatonin, ondansetron (ZOFRAN) IV, oxyCODONE, sodium chloride flush, traMADol  Xrays No results found.  Assessment/Plan: S/P Procedure(s) (LRB): AORTIC VALVE REPLACEMENT (AVR) USING EDWARDS RESILIA 29 MM AORTIC VALVE. (N/A) CORONARY ARTERY BYPASS GRAFTING (CABG) USING [place] to LAD; ENDOSCOPIC HARVESTED RIGHT GREATER SAPHENOUS VEIN: SVG to PD; SVG to SEQUENCED INTERMEDIATE to OM (N/A) TRANSESOPHAGEAL ECHOCARDIOGRAM (TEE) (N/A) ENDOVEIN HARVEST OF GREATER SAPHENOUS VEIN (Right)  1 Tmax 99, VSS, afib with CVR, some sinus with PVC's, not on antiarrythmic or beta blocker with low BP at times-  on midodrine 2 sats good on 2 liters, more SOB today 3 BS fine, will stop CBG's, no DM preop 4 no new labs 5 more congested today , will add mucinex to help with secretions, will need covid test for SNF- will order 6 good UOP, has been on lasix without potassium- check labs/CXR, f/u on effusions, may not need lasix     LOS: 38 days    John Giovanni PA-C Pager [phone] 06/23/2020 Patient seen and examined, agree with above He is unhappy about being awakened for weight at 5 Am Agree with mucinex for  congestion  Remo Lipps C. Roxan Hockey, MD [facility] [phone]

## 2020-06-23 NOTE — Plan of Care (Signed)
  Problem: Fluid Volume: Goal: Compliance with measures to maintain balanced fluid volume will improve Outcome: Progressing   Problem: Education: Goal: Knowledge of General Education information will improve Description: Including pain rating scale, medication(s)/side effects and non-pharmacologic comfort measures Outcome: Progressing   Problem: Health Behavior/Discharge Planning: Goal: Ability to manage health-related needs will improve Outcome: Progressing   Problem: Clinical Measurements: Goal: Ability to maintain clinical measurements within normal limits will improve Outcome: Progressing Goal: Will remain free from infection Outcome: Progressing Goal: Diagnostic test results will improve Outcome: Progressing Goal: Respiratory complications will improve Outcome: Progressing Goal: Cardiovascular complication will be avoided Outcome: Progressing   Problem: Activity: Goal: Risk for activity intolerance will decrease Outcome: Progressing   Problem: Elimination: Goal: Will not experience complications related to bowel motility Outcome: Progressing Goal: Will not experience complications related to urinary retention Outcome: Progressing   Problem: Skin Integrity: Goal: Risk for impaired skin integrity will decrease Outcome: Progressing   Problem: Education: Goal: Ability to demonstrate management of disease process will improve Outcome: Progressing Goal: Ability to verbalize understanding of medication therapies will improve Outcome: Progressing Goal: Individualized Educational Video(s) Outcome: Progressing   Problem: Activity: Goal: Capacity to carry out activities will improve Outcome: Progressing   Problem: Cardiac: Goal: Ability to achieve and maintain adequate cardiopulmonary perfusion will improve Outcome: Progressing   Problem: Activity: Goal: Risk for activity intolerance will decrease Outcome: Progressing   Problem: Skin Integrity: Goal: Wound  healing without signs and symptoms of infection Outcome: Progressing Goal: Risk for impaired skin integrity will decrease Outcome: Progressing   Problem: Respiratory: Goal: Respiratory status will improve Outcome: Progressing

## 2020-06-23 NOTE — Plan of Care (Signed)
Problem: Education: Goal: Knowledge of disease and its progression will improve 06/23/2020 2325 by Renee Rival, RN Outcome: Progressing 06/23/2020 2324 by Renee Rival, RN Outcome: Progressing   Problem: Fluid Volume: Goal: Compliance with measures to maintain balanced fluid volume will improve 06/23/2020 2325 by Renee Rival, RN Outcome: Progressing 06/23/2020 2324 by Renee Rival, RN Outcome: Progressing   Problem: Education: Goal: Knowledge of General Education information will improve Description: Including pain rating scale, medication(s)/side effects and non-pharmacologic comfort measures 06/23/2020 2325 by Renee Rival, RN Outcome: Progressing 06/23/2020 2324 by Renee Rival, RN Outcome: Progressing   Problem: Health Behavior/Discharge Planning: Goal: Ability to manage health-related needs will improve 06/23/2020 2325 by Renee Rival, RN Outcome: Progressing 06/23/2020 2324 by Renee Rival, RN Outcome: Progressing   Problem: Clinical Measurements: Goal: Ability to maintain clinical measurements within normal limits will improve 06/23/2020 2325 by Renee Rival, RN Outcome: Progressing 06/23/2020 2324 by Renee Rival, RN Outcome: Progressing Goal: Will remain free from infection 06/23/2020 2325 by Renee Rival, RN Outcome: Progressing 06/23/2020 2324 by Renee Rival, RN Outcome: Progressing Goal: Diagnostic test results will improve 06/23/2020 2325 by Renee Rival, RN Outcome: Progressing 06/23/2020 2324 by Renee Rival, RN Outcome: Progressing Goal: Respiratory complications will improve 06/23/2020 2325 by Renee Rival, RN Outcome: Progressing 06/23/2020 2324 by Renee Rival, RN Outcome: Progressing Goal: Cardiovascular complication will be avoided 06/23/2020 2325 by Renee Rival, RN Outcome: Progressing 06/23/2020 2324 by Renee Rival, RN Outcome: Progressing   Problem: Nutrition: Goal: Adequate nutrition will be maintained 06/23/2020 2325 by Renee Rival, RN Outcome: Progressing 06/23/2020 2324 by Renee Rival, RN Outcome: Progressing   Problem: Coping: Goal: Level of anxiety will decrease 06/23/2020 2325 by Renee Rival, RN Outcome: Progressing 06/23/2020 2324 by Renee Rival, RN Outcome: Progressing   Problem: Elimination: Goal: Will not experience complications related to bowel motility 06/23/2020 2325 by Renee Rival, RN Outcome: Progressing 06/23/2020 2324 by Renee Rival, RN Outcome: Progressing Goal: Will not experience complications related to urinary retention 06/23/2020 2325 by Renee Rival, RN Outcome: Progressing 06/23/2020 2324 by Renee Rival, RN Outcome: Progressing   Problem: Elimination: Goal: Will not experience complications related to bowel motility 06/23/2020 2325 by Renee Rival, RN Outcome: Progressing 06/23/2020 2324 by Renee Rival, RN Outcome: Progressing Goal: Will not experience complications related to urinary retention 06/23/2020 2325 by Renee Rival, RN Outcome: Progressing 06/23/2020 2324 by Renee Rival, RN Outcome: Progressing   Problem: Education: Goal: Ability to demonstrate management of disease process will improve 06/23/2020 2325 by Renee Rival, RN Outcome: Progressing 06/23/2020 2324 by Renee Rival, RN Outcome: Progressing Goal: Ability to verbalize understanding of medication therapies will improve 06/23/2020 2325 by Renee Rival, RN Outcome: Progressing 06/23/2020 2324 by Renee Rival, RN Outcome: Progressing Goal: Individualized Educational Video(s) 06/23/2020 2325 by Renee Rival, RN Outcome: Progressing 06/23/2020 2324 by Renee Rival, RN Outcome: Progressing   Problem: Education: Goal: Will demonstrate  proper wound care and an understanding of methods to prevent future damage 06/23/2020 2325 by Renee Rival, RN Outcome: Progressing 06/23/2020 2324 by Renee Rival, RN Outcome: Progressing Goal: Knowledge of disease or condition will improve 06/23/2020 2325 by Renee Rival, RN Outcome: Progressing 06/23/2020 2324 by Renee Rival, RN Outcome: Progressing Goal: Knowledge of the prescribed therapeutic regimen will improve 06/23/2020 2325 by Renee Rival, RN  Outcome: Progressing 06/23/2020 2324 by Renee Rival, RN Outcome: Progressing Goal: Individualized Educational Video(s) 06/23/2020 2325 by Renee Rival, RN Outcome: Progressing 06/23/2020 2324 by Renee Rival, RN Outcome: Progressing   Problem: Cardiac: Goal: Ability to achieve and maintain adequate cardiopulmonary perfusion will improve 06/23/2020 2325 by Renee Rival, RN Outcome: Progressing 06/23/2020 2324 by Renee Rival, RN Outcome: Progressing   Problem: Activity: Goal: Risk for activity intolerance will decrease 06/23/2020 2325 by Renee Rival, RN Outcome: Progressing 06/23/2020 2324 by Renee Rival, RN Outcome: Progressing   Problem: Cardiac: Goal: Will achieve and/or maintain hemodynamic stability 06/23/2020 2325 by Renee Rival, RN Outcome: Progressing 06/23/2020 2324 by Renee Rival, RN Outcome: Progressing   Problem: Clinical Measurements: Goal: Postoperative complications will be avoided or minimized 06/23/2020 2325 by Renee Rival, RN Outcome: Progressing 06/23/2020 2324 by Renee Rival, RN Outcome: Progressing   Problem: Respiratory: Goal: Respiratory status will improve 06/23/2020 2325 by Renee Rival, RN Outcome: Progressing 06/23/2020 2324 by Renee Rival, RN Outcome: Progressing   Problem: Skin Integrity: Goal: Wound healing without signs and symptoms of  infection 06/23/2020 2325 by Renee Rival, RN Outcome: Progressing 06/23/2020 2324 by Renee Rival, RN Outcome: Progressing Goal: Risk for impaired skin integrity will decrease 06/23/2020 2325 by Renee Rival, RN Outcome: Progressing 06/23/2020 2324 by Renee Rival, RN Outcome: Progressing

## 2020-06-24 MED ORDER — ENSURE ENLIVE PO LIQD
237.0000 mL | Freq: Four times a day (QID) | ORAL | Status: DC
  Administered 2020-06-24 – 2020-06-26 (×7): 237 mL via ORAL

## 2020-06-24 MED ORDER — AMIODARONE HCL IN DEXTROSE 360-4.14 MG/200ML-% IV SOLN
30.0000 mg/h | INTRAVENOUS | Status: DC
  Administered 2020-06-24 – 2020-06-25 (×2): 30 mg/h via INTRAVENOUS
  Filled 2020-06-24 (×3): qty 200

## 2020-06-24 MED ORDER — AMIODARONE HCL IN DEXTROSE 360-4.14 MG/200ML-% IV SOLN
60.0000 mg/h | INTRAVENOUS | Status: AC
  Administered 2020-06-24: 60 mg/h via INTRAVENOUS

## 2020-06-24 MED ORDER — APIXABAN 5 MG PO TABS
5.0000 mg | ORAL_TABLET | Freq: Two times a day (BID) | ORAL | Status: DC
  Administered 2020-06-24 – 2020-06-26 (×5): 5 mg via ORAL
  Filled 2020-06-24 (×5): qty 1

## 2020-06-24 NOTE — TOC Progression Note (Signed)
Transition of Care [facility]) - Progression Note    Patient Details  Name: [name] [name] MRN: [id number] Date of Birth: 10-31-1927  Transition of Care [facility]) CM/SW [place], [place] Phone Number: 06/24/2020, 3:40 PM  Clinical Narrative:     CSW spoke with patient's wife,[name] [name]- informed CSW was informed by [name] [name] has denied and will more than likely denied for [facility] SNF as well. If she wants SNF she will have to pay out of pocket cost  or the patient can discharge home with home health.  [name] [name] states she is willing to pay private pay for 7-10 days.  [name] [name] has confirmed bed offer. Patient will need covid test prior to discharge  CSW has informed the patent's spouse bed has been confirmed and patient can d/c there once he is medially stable.   CSW will continue to follow and assist with discharge planning.   [name] [name], MSW, LCSW Clinical Social Worker     Expected Discharge Plan: Skilled Nursing Facility Barriers to Discharge: Continued Medical Work up  Expected Discharge Plan and Services Expected Discharge Plan: [place] arrangements for the past 2 months: Single Family Home                                       Social Determinants of Health (SDOH) Interventions    Readmission Risk Interventions No flowsheet data found.

## 2020-06-24 NOTE — Progress Notes (Signed)
Nutrition Follow-up  DOCUMENTATION CODES:   Non-severe (moderate) malnutrition in context of chronic illness  INTERVENTION:    Increase Ensure Enlive po QID, each supplement provides 350 kcal and 20 grams of protein  Continue MVI with minerals daily  NUTRITION DIAGNOSIS:   Moderate Malnutrition related to chronic illness (CHF, LLE edema) as evidenced by mild fat depletion,severe muscle depletion,moderate muscle depletion.  Ongoing   GOAL:   Patient will meet greater than or equal to 90% of their needs  Progressing   MONITOR:   PO intake,Supplement acceptance,Labs,Weight trends,Skin  REASON FOR ASSESSMENT:   Consult Enteral/tube feeding initiation and management  ASSESSMENT:   Pt with PMH of HTN, HLD, chronic LLE, peripheral neuropathy, essential tremor, and prostate cancer. Presented with CHF exacerbation and severe low flow AS. S/p right/left heart cath and coronary angiography on 11/22. Underwent CABG x4 and tissue AVR on 11/26.   Intake progressing slowly, but improved. Last 6 meal completions charted as 60%. Taking three Ensure daily. Per nursing very eager to drink Ensure throughout the day. Now walking with PT. Will increase Ensure to QID.   Awaiting insurance authorization for SNF.   Admission weight: 83 kg  Current weight: 78 kg   UOP: 2200 ml x 24 hrs   Medications: colace, 40 mg lasix daily, remeron, senokot Labs: CBG 105-155  Diet Order:   Diet Order            Diet regular Room service appropriate? Yes with Assist; Fluid consistency: Thin  Diet effective now                 EDUCATION NEEDS:   Education needs have been addressed  Skin:  Skin Assessment: Skin Integrity Issues: Skin Integrity Issues:: Stage II Stage II: sacrum Wound: L lower flank Incision: sternum  Last BM:  12/27  Height:   Ht Readings from Last 1 Encounters:  05/23/20 5\' 9"  (1.753 m)    Weight:   Wt Readings from Last 1 Encounters:  06/24/20 78 kg     Ideal Body Weight:  72.7 kg  BMI:  Body mass index is 25.39 kg/m.  Estimated Nutritional Needs:   Kcal:  1700-1900  Protein:  90-110  Fluid:  >/= 2 L/day  Mariana Single RD, LDN Clinical Nutrition Pager listed in [place]

## 2020-06-24 NOTE — Progress Notes (Signed)
Received a call from Baruch Gouty, patient's wife.  I explained that I'm no longer following Mr. Convey but I would help if I could.  She expressed that she feels its time for him to be discharged.  She does not want him to go to [facility].  She wants him to go to [facility].  She states that [employer], the hospital, and the rehab facilities are not communicating well and it is holding up his discharge.  It sounded as though the insurance company went to Dr. Chana Bode for authorization.   I explained that Mr. Rosiak is under the care of [facility] rather than the Hospitalists so the insurance company asked the wrong doctor for authorization.  She re-iterated I'm just afraid they are going to discharge him to [facility] before I get things worked out with [facility].  I explained that they won't discharge him with out her knowing about it and that I would pass the information along to the appropriate people.

## 2020-06-24 NOTE — Progress Notes (Addendum)
   06/24/20 0214  Vitals  Temp 97.6 F (36.4 C)  Temp Source Oral  BP 98/70  MAP (mmHg) 79  BP Location Left Arm  BP Method Automatic  Patient Position (if appropriate) Lying  Pulse Rate 100  Pulse Rate Source Monitor  ECG Heart Rate 100  Resp 19  MEWS COLOR  MEWS Score Color Green  Oxygen Therapy  SpO2 95 %  O2 Device Nasal Cannula  O2 Flow Rate (L/min) 2 L/min  MEWS Score  MEWS Temp 0  MEWS Systolic 1  MEWS Pulse 0  MEWS RR 0  MEWS LOC 0  MEWS Score 1  Notified this staff that patient is now in Aflutter just after his HR dropped to  39. Vital signs remain stable patient sleeping quietly in bed no sign of distress noted, EKG showed aflutter with variable AV block charge RN notified will continue to monitor patient

## 2020-06-24 NOTE — Progress Notes (Signed)
ANTICOAGULATION CONSULT NOTE - Initial Consult  Pharmacy Consult for Eliquis dosing Indication: atrial fibrillation  Allergies  Allergen Reactions  . Simvastatin Other (See Comments)    "caused neuropathy pain"    Patient Measurements: Height: 5\' 9"  (175.3 cm) Weight: 78 kg (171 lb 15.3 oz) IBW/kg (Calculated) : 70.7  Vital Signs: Temp: 97.7 F (36.5 C) (12/28 0806) Temp Source: Oral (12/28 0806) BP: 103/66 (12/28 0448) Pulse Rate: 68 (12/28 0448)  Labs: Recent Labs    06/23/20 0750  HGB 10.3*  HCT 32.5*  PLT 187  CREATININE 1.21    Estimated Creatinine Clearance: 40.6 mL/min (by C-G formula based on SCr of 1.21 mg/dL).   Medical History: Past Medical History:  Diagnosis Date  . Aortic stenosis   . Arthritis   . CAD (coronary artery disease)   . Colitis   . Edema   . Essential hypertension    no med now  . Essential tremor   . Hyperlipidemia   . Hypotonic bladder    L4-L5  . Obstructive uropathy   . Peripheral neuropathy    Elevated by Dr. love 2010  . Prostate cancer ([place]) 2005    Assessment: 50 yoM presenting with SOB/DOE. S/p CABG + AVR 12/26. Initially on lovenox for DVT ppx, then held perioperatively. Pharmacy now consulted to initiate Eliquis for atrial fibrilation.     Goal of Therapy:  Prevent VTE Monitor platelets by anticoagulation protocol: Yes   Plan:  Start Eliquis 5 mg PO BID  Monitor daily CBC and s/sx of bleeding  [name], PharmD PGY1 Acute Care Pharmacy Resident  06/24/2020 8:16 AM  Please check [email or web address] for unit-specific pharmacy phone numbers.

## 2020-06-24 NOTE — Progress Notes (Addendum)
[address]       [place] [address]             [phone]      32 Days Post-Op Procedure(s) (LRB): AORTIC VALVE REPLACEMENT (AVR) USING EDWARDS RESILIA 29 MM AORTIC VALVE. (N/A) CORONARY ARTERY BYPASS GRAFTING (CABG) USING LIMA to LAD; ENDOSCOPIC HARVESTED RIGHT GREATER SAPHENOUS VEIN: SVG to PD; SVG to SEQUENCED INTERMEDIATE to OM (N/A) TRANSESOPHAGEAL ECHOCARDIOGRAM (TEE) (N/A) ENDOVEIN HARVEST OF GREATER SAPHENOUS VEIN (Right) Subjective: Walked once 400 feet yesterday  Objective: Vital signs in last 24 hours: Temp:  [97.4 F (36.3 C)-98.3 F (36.8 C)] 97.9 F (36.6 C) (12/28 0448) Pulse Rate:  [68-100] 68 (12/28 0448) Cardiac Rhythm: Atrial flutter;Bundle branch block (12/28 0142) Resp:  [19-22] 20 (12/28 0448) BP: (97-122)/(66-88) 103/66 (12/28 0448) SpO2:  [90 %-99 %] 95 % (12/28 0448) Weight:  [78 kg] 78 kg (12/28 0448)  Hemodynamic parameters for last 24 hours:    Intake/Output from previous day: 12/27 0701 - 12/28 0700 In: -  Out: 2200 [Urine:2200] Intake/Output this shift: No intake/output data recorded.  General appearance: alert, cooperative, distracted and no distress Heart: irregularly irregular rhythm Lungs: clear to auscultation bilaterally Abdomen: benign Extremities: no edema Wound: incis healing well  Lab Results: Recent Labs    06/23/20 0750  WBC 5.6  HGB 10.3*  HCT 32.5*  PLT 187   BMET:  Recent Labs    06/23/20 0750  NA 140  K 4.7  CL 101  CO2 31  GLUCOSE 104*  BUN 28*  CREATININE 1.21  CALCIUM 8.7*    PT/INR: No results for input(s): LABPROT, INR in the last 72 hours. ABG    Component Value Date/Time   PHART 7.381 05/26/2020 1750   HCO3 22.2 05/26/2020 1750   TCO2 23 05/26/2020 1750   ACIDBASEDEF 2.0 05/26/2020 1750   O2SAT 64.7 06/16/2020 0320   CBG (last 3)  Recent Labs    06/22/20 2341 06/23/20 0345 06/23/20 0733  GLUCAP 105* 106* 115*    Meds Scheduled Meds: . acetaminophen   650 mg Oral TID  . ALPRAZolam  0.25 mg Oral QHS  . antiseptic oral rinse  15 mL Mouth Rinse TID  . aspirin EC  81 mg Oral Daily  . Chlorhexidine Gluconate Cloth  6 each Topical Daily  . dextromethorphan-guaiFENesin  1 tablet Oral BID  . docusate sodium  200 mg Oral Daily  . feeding supplement  237 mL Oral TID BM  . ferrous [id number] C-folic acid  1 capsule Oral BID PC  . furosemide  40 mg Oral Daily  . mouth rinse  15 mL Mouth Rinse BID  . midodrine  5 mg Oral TID  . mirtazapine  7.5 mg Oral QHS  . multivitamin with minerals  1 tablet Oral Daily  . pantoprazole  40 mg Oral Daily  . senna  1 tablet Oral Daily  . tamsulosin  0.4 mg Oral Daily   Continuous Infusions: . sodium chloride Stopped (06/08/20 0941)   PRN Meds:.sodium chloride, bisacodyl, Gerhardt's butt cream, melatonin, ondansetron (ZOFRAN) IV, oxyCODONE, sodium chloride flush, traMADol  Xrays DG Abd 2 Views  Result Date: 06/23/2020 CLINICAL DATA:  Follow-up surgery EXAM: ABDOMEN - 2 VIEW COMPARISON:  06/04/2020 FINDINGS: Bowel gas pattern is unremarkable wall without evidence of ileus or obstruction. No significant soft tissue calcifications. Ordinary chronic spinal curvature and degenerative change. IMPRESSION: No acute or significant finding.  Gas pattern normal. Electronically Signed   By:  Nelson Chimes M.D.   On: 06/23/2020 08:42    Assessment/Plan: S/P Procedure(s) (LRB): AORTIC VALVE REPLACEMENT (AVR) USING EDWARDS RESILIA 29 MM AORTIC VALVE. (N/A) CORONARY ARTERY BYPASS GRAFTING (CABG) USING LIMA to LAD; ENDOSCOPIC HARVESTED RIGHT GREATER SAPHENOUS VEIN: SVG to PD; SVG to SEQUENCED INTERMEDIATE to OM (N/A) TRANSESOPHAGEAL ECHOCARDIOGRAM (TEE) (N/A) ENDOVEIN HARVEST OF GREATER SAPHENOUS VEIN (Right)  1 Afeb, VSS with low relative SBP, atrial fib/flutter,some brady. Not on anti dysrythmics or beta blocker currently- may need to consider ( beta blockers held for low BP and dig has been stopped) .  conts  midodrine, he is on baby asa only. Not sure how aggressive to be at this point - may need to ask cardiology for opinion.  2 sats ok on 2 liters 3 labs are stable from yesterday 4 no signif effus on Xray 5 SNF auth pending, may need O2 short term there as well    Addendum- spoke with Dr Aundra Dubin who recommended IV amio for 24 hours and start eliquis  LOS: 78 days    John Giovanni PA-C Pager [phone] 06/24/2020 Patient seen and examined, agree with above He is upset about issues related to rehab approval- will check with social work  Remo Lipps C. Roxan Hockey, MD [facility] [phone]

## 2020-06-24 NOTE — Discharge Instructions (Signed)
Information on my medicine - ELIQUIS (apixaban)  Why was Eliquis prescribed for you? Eliquis was prescribed for you to reduce the risk of forming blood clots that can cause a stroke if you have a medical condition called atrial fibrillation (a type of irregular heartbeat) OR to reduce the risk of a blood clots forming after orthopedic surgery.  What do You need to know about Eliquis ? Take your Eliquis TWICE DAILY - one tablet in the morning and one tablet in the evening with or without food.  It would be best to take the doses about the same time each day.  If you have difficulty swallowing the tablet whole please discuss with your pharmacist how to take the medication safely.  Take Eliquis exactly as prescribed by your doctor and DO NOT stop taking Eliquis without talking to the doctor who prescribed the medication.  Stopping may increase your risk of developing a new clot or stroke.  Refill your prescription before you run out.  After discharge, you should have regular check-up appointments with your healthcare provider that is prescribing your Eliquis.  In the future your dose may need to be changed if your kidney function or weight changes by a significant amount or as you get older.  What do you do if you miss a dose? If you miss a dose, take it as soon as you remember on the same day and resume taking twice daily.  Do not take more than one dose of ELIQUIS at the same time.  Important Safety Information A possible side effect of Eliquis is bleeding. You should call your healthcare provider right away if you experience any of the following: ? Bleeding from an injury or your nose that does not stop. ? Unusual colored urine (red or dark brown) or unusual colored stools (red or black). ? Unusual bruising for unknown reasons. ? A serious fall or if you hit your head (even if there is no bleeding).  Some medicines may interact with Eliquis and might increase your risk of bleeding  or clotting while on Eliquis. To help avoid this, consult your healthcare provider or pharmacist prior to using any new prescription or non-prescription medications, including herbals, vitamins, non-steroidal anti-inflammatory drugs (NSAIDs) and supplements.  This website has more information on Eliquis (apixaban): [email or web address].    Discharge Instructions:  1. You may shower, please wash incisions daily with soap and water and keep dry.  If you wish to cover wounds with dressing you may do so but please keep clean and change daily.  No tub baths or swimming until incisions have completely healed.  If your incisions become red or develop any drainage please call our office at [phone]  2. No Driving until cleared by Dr. Vivi Martens office and you are no longer using narcotic pain medications  3. Monitor your weight daily.. Please use the same scale and weigh at same time... If you gain 5-10 lbs in 48 hours with associated lower extremity swelling, please contact our office at [phone]  4. Fever of 101.5 for at least 24 hours with no source, please contact our office at [phone]  5. Activity- up as tolerated, please walk at least 3 times per day.  Avoid strenuous activity, no lifting, pushing, or pulling with your arms over 8-10 lbs for a minimum of 6 weeks  6. If any questions or concerns arise, please do not hesitate to contact our office at [phone]

## 2020-06-24 NOTE — Progress Notes (Signed)
CARDIAC REHAB PHASE I   PRE:  Rate/Rhythm: 87 afib  BP:  Supine:   Sitting: 110/79  Standing:    SaO2: 97% 2L  MODE:  Ambulation: 430 ft   POST:  Rate/Rhythm: 93 afib  BP:  Supine:   Sitting: 110/91  Standing:    SaO2: 94% 2L hall and room 1310-1410 Came to see pt to walk. Pt wanted to eat a little more before walking. Returned at [employer]. Pt walked 430 ft on 2L with rollator and asst x 1. Pt took a couple of standing rest breaks and sat halfway to rest. Encouraged pt to stay close to rollator and to rest when needed. Checked sats when sitting in hallway at 94%. To recliner with call bell after walk. Encouraged pt to walk again later with Mobility specialist.   Graylon Good, RN BSN  06/24/2020 2:09 PM

## 2020-06-24 NOTE — Care Management Important Message (Signed)
Important Message  Patient Details  Name: [name] [name] MRN: [id number] Date of Birth: Mar 08, 1928   Medicare Important Message Given:  Yes     [name] 06/24/2020, 12:36 PM

## 2020-06-25 ENCOUNTER — Telehealth: Payer: Self-pay | Admitting: *Deleted

## 2020-06-25 LAB — SARS CORONAVIRUS 2 BY RT PCR (HOSPITAL ORDER, PERFORMED IN ~~LOC~~ HOSPITAL LAB): SARS Coronavirus 2: NEGATIVE

## 2020-06-25 LAB — SARS CORONAVIRUS 2 (TAT 6-24 HRS): SARS Coronavirus 2: NEGATIVE

## 2020-06-25 MED ORDER — AMIODARONE HCL 200 MG PO TABS
200.0000 mg | ORAL_TABLET | Freq: Two times a day (BID) | ORAL | Status: DC
  Administered 2020-06-25 – 2020-06-26 (×3): 200 mg via ORAL
  Filled 2020-06-25 (×3): qty 1

## 2020-06-25 NOTE — Progress Notes (Signed)
      [address]       [place] [address]             [phone]       COVID 19 test ordered this morning by Jadene Pierini, PA-C. Will await results and for SNF discharge today vs.tomorrow depending on insurance and bed availability.   Of note: Most facilities are requesting COVID tests performed 6 hours prior to discharge. They will not accept a test done the day before.   Nicholes Rough, PA-C

## 2020-06-25 NOTE — Telephone Encounter (Signed)
Pt's wife, Mrs. Benish, called stating she is trying to appeal insurance company to get pt into a rehab facility. She stated Dr. Cyndia Bent needed to update a note for this to happen. Advised wife to speak with case manager in the hospital as they are the ones handling getting [name] [name] into rehab. No further questions.

## 2020-06-25 NOTE — Progress Notes (Signed)
Mobility Specialist - Progress Note   06/25/20 1129  Mobility  Activity Refused mobility   States he doesn't want to ambulate again until he has eaten. Will f/u as able.   Pricilla Handler Mobility Specialist Mobility Specialist Phone: [phone]

## 2020-06-25 NOTE — TOC Progression Note (Signed)
Transition of Care [facility]) - Progression Note    Patient Details  Name: [name] [name] MRN: [id number] Date of Birth: Nov 02, 1927  Transition of Care [facility]) CM/SW [place], [place] Phone Number: 06/25/2020, 2:06 PM  Clinical Narrative:     [name] has confirmed bed offer and agreement with patient's wife for discharge to SNF. CSW was informed medical team wants to monitor for one more day prior before discharging to SNF. CSW has informed patient's wife and SNF of anticipated discharge tomorrow.   Charged RN updated and advised covid test will be needed in preparation for discharge tomorrow.  CSW will continue to follow and assist with discharge planning.  [name], MSW, LCSW Clinical Social Worker   Expected Discharge Plan: Skilled Nursing Facility Barriers to Discharge: Continued Medical Work up  Expected Discharge Plan and Services Expected Discharge Plan: [place] arrangements for the past 2 months: Single Family Home                                       Social Determinants of Health (SDOH) Interventions    Readmission Risk Interventions No flowsheet data found.

## 2020-06-25 NOTE — Progress Notes (Signed)
[address]       [place] [address]             [phone]      33 Days Post-Op Procedure(s) (LRB): AORTIC VALVE REPLACEMENT (AVR) USING EDWARDS RESILIA 29 MM AORTIC VALVE. (N/A) CORONARY ARTERY BYPASS GRAFTING (CABG) USING LIMA to LAD; ENDOSCOPIC HARVESTED RIGHT GREATER SAPHENOUS VEIN: SVG to PD; SVG to SEQUENCED INTERMEDIATE to OM (N/A) TRANSESOPHAGEAL ECHOCARDIOGRAM (TEE) (N/A) ENDOVEIN HARVEST OF GREATER SAPHENOUS VEIN (Right) Subjective: Feels ok, remains pretty weak  Objective: Vital signs in last 24 hours: Temp:  [97.6 F (36.4 C)-98 F (36.7 C)] 98 F (36.7 C) (12/29 0426) Pulse Rate:  [57-100] 64 (12/29 0600) Cardiac Rhythm: Atrial flutter;Bundle branch block (12/28 1922) Resp:  [16-28] 18 (12/29 0600) BP: (91-117)/(64-86) 116/79 (12/29 0600) SpO2:  [95 %-100 %] 100 % (12/29 0600) Weight:  [73 kg] 73 kg (12/29 0426)  Hemodynamic parameters for last 24 hours:    Intake/Output from previous day: 12/28 0701 - 12/29 0700 In: 1200.1 [P.O.:744; I.V.:456.1] Out: 1050 [Urine:1050] Intake/Output this shift: Total I/O In: 1176.1 [P.O.:720; I.V.:456.1] Out: 1050 [Urine:1050]  General appearance: alert, cooperative, appears stated age, fatigued and no distress Heart: regular rate and rhythm Lungs: clear to auscultation bilaterally Abdomen: benign Extremities: no edema Wound: incis healing well  Lab Results: Recent Labs    06/23/20 0750  WBC 5.6  HGB 10.3*  HCT 32.5*  PLT 187   BMET:  Recent Labs    06/23/20 0750  NA 140  K 4.7  CL 101  CO2 31  GLUCOSE 104*  BUN 28*  CREATININE 1.21  CALCIUM 8.7*    PT/INR: No results for input(s): LABPROT, INR in the last 72 hours. ABG    Component Value Date/Time   PHART 7.381 05/26/2020 1750   HCO3 22.2 05/26/2020 1750   TCO2 23 05/26/2020 1750   ACIDBASEDEF 2.0 05/26/2020 1750   O2SAT 64.7 06/16/2020 0320   CBG (last 3)  Recent Labs    06/22/20 2341 06/23/20 0345  06/23/20 0733  GLUCAP 105* 106* 115*    Meds Scheduled Meds: . acetaminophen  650 mg Oral TID  . ALPRAZolam  0.25 mg Oral QHS  . antiseptic oral rinse  15 mL Mouth Rinse TID  . apixaban  5 mg Oral BID  . aspirin EC  81 mg Oral Daily  . Chlorhexidine Gluconate Cloth  6 each Topical Daily  . dextromethorphan-guaiFENesin  1 tablet Oral BID  . docusate sodium  200 mg Oral Daily  . feeding supplement  237 mL Oral QID  . ferrous [id number] C-folic acid  1 capsule Oral BID PC  . furosemide  40 mg Oral Daily  . mouth rinse  15 mL Mouth Rinse BID  . midodrine  5 mg Oral TID  . mirtazapine  7.5 mg Oral QHS  . multivitamin with minerals  1 tablet Oral Daily  . pantoprazole  40 mg Oral Daily  . senna  1 tablet Oral Daily  . tamsulosin  0.4 mg Oral Daily   Continuous Infusions: . sodium chloride Stopped (06/08/20 0941)  . amiodarone 30 mg/hr (06/25/20 0238)   PRN Meds:.sodium chloride, bisacodyl, Gerhardt's butt cream, melatonin, ondansetron (ZOFRAN) IV, oxyCODONE, sodium chloride flush, traMADol  Xrays DG Abd 2 Views  Result Date: 06/23/2020 CLINICAL DATA:  Follow-up surgery EXAM: ABDOMEN - 2 VIEW COMPARISON:  06/04/2020 FINDINGS: Bowel gas pattern is unremarkable wall without evidence of ileus or obstruction. No significant soft  tissue calcifications. Ordinary chronic spinal curvature and degenerative change. IMPRESSION: No acute or significant finding.  Gas pattern normal. Electronically Signed   By: Nelson Chimes M.D.   On: 06/23/2020 08:42    Assessment/Plan: S/P Procedure(s) (LRB): AORTIC VALVE REPLACEMENT (AVR) USING EDWARDS RESILIA 29 MM AORTIC VALVE. (N/A) CORONARY ARTERY BYPASS GRAFTING (CABG) USING LIMA to LAD; ENDOSCOPIC HARVESTED RIGHT GREATER SAPHENOUS VEIN: SVG to PD; SVG to SEQUENCED INTERMEDIATE to OM (N/A) TRANSESOPHAGEAL ECHOCARDIOGRAM (TEE) (N/A) ENDOVEIN HARVEST OF GREATER SAPHENOUS VEIN (Right)  1 afebrile, aflutter, current in sinus with 1 deg HB,  HR  57-100 on amio gtt 2 no new labs 3 sats good on RA 4 covid test ordered 2 days ago, not sure if done, will re-order 5 convert to po amio, cont eliquis 6 hopefully SNF by tomorrow if rhythm is stable   LOS: 40 days    John Giovanni PA-C Pager [phone] 06/25/2020

## 2020-06-25 NOTE — Progress Notes (Signed)
Mobility Specialist - Progress Note   06/25/20 1357  Mobility  Activity Ambulated in hall  Level of Assistance Minimal assist, patient does 75% or more  Assistive Device Four wheel walker  Distance Ambulated (ft) 260 ft (Intervals: 130 ft x 2)  Mobility Response Tolerated fair  Mobility performed by Mobility specialist  $Mobility charge 1 Mobility    Pre-mobility: 83 HR, 128/79 BP, 95% SpO2 During mobility: 91 HR, 92% SpO2 Post-mobility: 87 HR  Ambulated on RA. One seated rest break lasting several minutes due to fatigue. Pt c/o minor dizziness prior to ambulation that did not worsen w/ activity. Post mobility vitals not taken as pt was assisted to the toilet to have a BM. Oriented to call wire and expressed understanding to not get up w/o assistance.   Pricilla Handler Mobility Specialist Mobility Specialist Phone: [phone]

## 2020-06-25 NOTE — Progress Notes (Signed)
CARDIAC REHAB PHASE I   PRE:  Rate/Rhythm: 77 afib  BP:  Supine: 116/76  Sitting:   Standing:    SaO2: 93%RA  MODE:  Ambulation: 430 ft   POST:  Rate/Rhythm: 98 afib  BP:  Supine:   Sitting: 118/81  Standing:    SaO2: 92%RA 0850-0932 Pt walked 430 ft on RA with gait belt use, rollator and asst x 1. Sat halfway to rest and took 2 standing rest breaks. Checked sats when sitting and at 92%RA. Pt sat about five minutes to rest and then walked rest of way with one rest break. To recliner with call bell. Pt wants to walk two more times today. Reinforced sternal precautions. Call bell in reach.   Graylon Good, RN BSN  06/25/2020 9:27 AM

## 2020-06-26 MED ORDER — ACETAMINOPHEN 325 MG PO TABS: 650.0000 mg | ORAL_TABLET | Freq: Four times a day (QID) | ORAL | Status: DC | PRN

## 2020-06-26 MED ORDER — MIDODRINE HCL 5 MG PO TABS: 5.0000 mg | ORAL_TABLET | Freq: Three times a day (TID) | ORAL | Status: DC

## 2020-06-26 MED ORDER — DM-GUAIFENESIN ER 30-600 MG PO TB12: 1.0000 | ORAL_TABLET | Freq: Two times a day (BID) | ORAL | Status: DC | PRN

## 2020-06-26 MED ORDER — AMIODARONE HCL 200 MG PO TABS: 200.0000 mg | ORAL_TABLET | Freq: Two times a day (BID) | ORAL | Status: DC

## 2020-06-26 MED ORDER — APIXABAN 5 MG PO TABS: 5.0000 mg | ORAL_TABLET | Freq: Two times a day (BID) | ORAL | Status: DC

## 2020-06-26 MED ORDER — MELATONIN 5 MG PO TABS: 5.0000 mg | ORAL_TABLET | Freq: Every evening | ORAL | 0 refills | Status: DC | PRN

## 2020-06-26 MED ORDER — ENSURE ENLIVE PO LIQD: 237.0000 mL | Freq: Four times a day (QID) | ORAL | 12 refills | Status: DC

## 2020-06-26 MED ORDER — FE FUMARATE-B12-VIT C-FA-IFC PO CAPS: 1.0000 | ORAL_CAPSULE | Freq: Two times a day (BID) | ORAL | Status: DC

## 2020-06-26 NOTE — Progress Notes (Addendum)
[address]       [place] [address]             [phone]      34 Days Post-Op Procedure(s) (LRB): AORTIC VALVE REPLACEMENT (AVR) USING EDWARDS RESILIA 29 MM AORTIC VALVE. (N/A) CORONARY ARTERY BYPASS GRAFTING (CABG) USING LIMA to LAD; ENDOSCOPIC HARVESTED RIGHT GREATER SAPHENOUS VEIN: SVG to PD; SVG to SEQUENCED INTERMEDIATE to OM (N/A) TRANSESOPHAGEAL ECHOCARDIOGRAM (TEE) (N/A) ENDOVEIN HARVEST OF GREATER SAPHENOUS VEIN (Right) Subjective: Rhythm conts to stabilize , mostly sinus with 1 deg block, some afib with CVR  Objective: Vital signs in last 24 hours: Temp:  [97.5 F (36.4 C)-98.7 F (37.1 C)] 97.8 F (36.6 C) (12/30 0600) Pulse Rate:  [66-99] 77 (12/30 0600) Cardiac Rhythm: Atrial fibrillation (12/29 2010) Resp:  [17-23] 17 (12/30 0600) BP: (81-116)/(56-83) 101/68 (12/30 0600) SpO2:  [89 %-98 %] 96 % (12/30 0600) Weight:  [76.5 kg] 76.5 kg (12/30 0500)  Hemodynamic parameters for last 24 hours:    Intake/Output from previous day: 12/29 0701 - 12/30 0700 In: 4580 [P.O.:1320; I.V.:67] Out: 1775 [Urine:1775] Intake/Output this shift: No intake/output data recorded.  General appearance: alert, cooperative, fatigued and no distress Heart: regular rate and rhythm Lungs: clear to auscultation bilaterally Abdomen: benign Extremities: no edema Wound: incis healing well  Lab Results: Recent Labs    06/23/20 0750  WBC 5.6  HGB 10.3*  HCT 32.5*  PLT 187   BMET:  Recent Labs    06/23/20 0750  NA 140  K 4.7  CL 101  CO2 31  GLUCOSE 104*  BUN 28*  CREATININE 1.21  CALCIUM 8.7*    PT/INR: No results for input(s): LABPROT, INR in the last 72 hours. ABG    Component Value Date/Time   PHART 7.381 05/26/2020 1750   HCO3 22.2 05/26/2020 1750   TCO2 23 05/26/2020 1750   ACIDBASEDEF 2.0 05/26/2020 1750   O2SAT 64.7 06/16/2020 0320   CBG (last 3)  Recent Labs    06/23/20 0733  GLUCAP 115*    Meds Scheduled Meds: .  acetaminophen  650 mg Oral TID  . ALPRAZolam  0.25 mg Oral QHS  . amiodarone  200 mg Oral BID  . antiseptic oral rinse  15 mL Mouth Rinse TID  . apixaban  5 mg Oral BID  . aspirin EC  81 mg Oral Daily  . Chlorhexidine Gluconate Cloth  6 each Topical Daily  . dextromethorphan-guaiFENesin  1 tablet Oral BID  . docusate sodium  200 mg Oral Daily  . feeding supplement  237 mL Oral QID  . ferrous [id number] C-folic acid  1 capsule Oral BID PC  . furosemide  40 mg Oral Daily  . mouth rinse  15 mL Mouth Rinse BID  . midodrine  5 mg Oral TID  . mirtazapine  7.5 mg Oral QHS  . multivitamin with minerals  1 tablet Oral Daily  . pantoprazole  40 mg Oral Daily  . senna  1 tablet Oral Daily  . tamsulosin  0.4 mg Oral Daily   Continuous Infusions: . sodium chloride Stopped (06/08/20 0941)   PRN Meds:.sodium chloride, bisacodyl, Gerhardt's butt cream, melatonin, ondansetron (ZOFRAN) IV, oxyCODONE, sodium chloride flush, traMADol  Xrays No results found.  Assessment/Plan: S/P Procedure(s) (LRB): AORTIC VALVE REPLACEMENT (AVR) USING EDWARDS RESILIA 29 MM AORTIC VALVE. (N/A) CORONARY ARTERY BYPASS GRAFTING (CABG) USING LIMA to LAD; ENDOSCOPIC HARVESTED RIGHT GREATER SAPHENOUS VEIN: SVG to PD; SVG to SEQUENCED INTERMEDIATE  to OM (N/A) TRANSESOPHAGEAL ECHOCARDIOGRAM (TEE) (N/A) ENDOVEIN HARVEST OF GREATER SAPHENOUS VEIN (Right)   1 afeb, VSS- afib with sinus, 1 deg AV block - cont po amio and eliquis,- has had some lower BP at times, no evid of volume overload and weight down a fair amount- will d/c lasix 2 sats good on RA/2 liters 3 covid neg 4 no other new labs or CXR's 5 appears stable for transfer to SNF, family and he are anxious about this     LOS: 41 days    [name] Giovanni PA-C Pager [phone] 06/26/2020

## 2020-06-26 NOTE — Progress Notes (Signed)
Pt discharged to SNF. Report given to Tandy Gaw at [facility] at [phone].  Daymon Larsen, RN

## 2020-06-26 NOTE — TOC Transition Note (Signed)
Transition of Care [facility]) - CM/SW Discharge Note   Patient Details  Name: Jawad Wiacek MRN: [id number] Date of Birth: 1928-02-25  Transition of Care [facility]) CM/SW Contact:  Vinie Sill, [place] Phone Number: 06/26/2020, 11:38 AM   Clinical Narrative:     Patient will DC to: [facility] DC Date: 06/26/2020 Family Notified: spouse  Transport By: Corey Harold  Per MD patient is ready for discharge. RN, patient, and facility notified of DC. Discharge Summary sent to facility. RN given number for [phone], Room 7006. Ambulance transport requested for patient.   Clinical Social Worker signing off.  [name], MSW, LCSW Clinical Social Worker    Final next level of care: Skilled Nursing Facility Barriers to Discharge: Barriers Resolved   Patient Goals and CMS Choice Patient states their goals for this hospitalization and ongoing recovery are:: to go to SNF CMS Medicare.gov Compare Post Acute Care list provided to:: Patient Choice offered to / list presented to : Patient  Discharge Placement              Patient [facility] bed at: [facility] Patient to be transferred to facility by: [place] Name of family member notified: wife Patient and family notified of of transfer: 06/26/20  Discharge Plan and Services                                     Social Determinants of Health (SDOH) Interventions     Readmission Risk Interventions No flowsheet data found.

## 2020-06-26 NOTE — Progress Notes (Signed)
Vitals: BP 85/60 (69). HR 76 bpm. A fib on the monitor. SpO2 90%. RR 23. Temp. 97.8 F. Vitals rechecked and BP 81/56 (65).  Dr. Roxan Hockey paged 06/26/20 [phone]. Page returned at [place]. No new orders. Dr. Roxan Hockey stated that fluids would not be appropriate, to monitor to the pt for symptoms and to recheck the BP in 4 hours. Will continue to monitor.  Adella Hare, RN\

## 2020-06-26 NOTE — Progress Notes (Signed)
Pt pulse was high 40s. Pt fatigued. Vital signs now pulse 61, BP 89/60, T 98.3, Resp 20, oxygen 94% on 2L [place]. EKG done. Notified Jadene Pierini PA.  Daymon Larsen, RN

## 2020-06-27 DIAGNOSIS — I10 Essential (primary) hypertension: Secondary | ICD-10-CM | POA: Diagnosis not present

## 2020-06-27 DIAGNOSIS — I4891 Unspecified atrial fibrillation: Secondary | ICD-10-CM | POA: Diagnosis not present

## 2020-06-27 DIAGNOSIS — E785 Hyperlipidemia, unspecified: Secondary | ICD-10-CM | POA: Diagnosis not present

## 2020-06-30 DIAGNOSIS — R0602 Shortness of breath: Secondary | ICD-10-CM | POA: Diagnosis not present

## 2020-06-30 DIAGNOSIS — I2510 Atherosclerotic heart disease of native coronary artery without angina pectoris: Secondary | ICD-10-CM | POA: Diagnosis not present

## 2020-06-30 DIAGNOSIS — Z951 Presence of aortocoronary bypass graft: Secondary | ICD-10-CM | POA: Diagnosis not present

## 2020-06-30 DIAGNOSIS — R269 Unspecified abnormalities of gait and mobility: Secondary | ICD-10-CM | POA: Diagnosis not present

## 2020-06-30 DIAGNOSIS — N183 Chronic kidney disease, stage 3 unspecified: Secondary | ICD-10-CM | POA: Diagnosis not present

## 2020-06-30 DIAGNOSIS — R0989 Other specified symptoms and signs involving the circulatory and respiratory systems: Secondary | ICD-10-CM | POA: Diagnosis not present

## 2020-06-30 DIAGNOSIS — R300 Dysuria: Secondary | ICD-10-CM | POA: Diagnosis not present

## 2020-06-30 DIAGNOSIS — I5030 Unspecified diastolic (congestive) heart failure: Secondary | ICD-10-CM | POA: Diagnosis not present

## 2020-06-30 DIAGNOSIS — M6281 Muscle weakness (generalized): Secondary | ICD-10-CM | POA: Diagnosis not present

## 2020-06-30 DIAGNOSIS — D649 Anemia, unspecified: Secondary | ICD-10-CM | POA: Diagnosis not present

## 2020-06-30 DIAGNOSIS — I4891 Unspecified atrial fibrillation: Secondary | ICD-10-CM | POA: Diagnosis not present

## 2020-06-30 DIAGNOSIS — Z952 Presence of prosthetic heart valve: Secondary | ICD-10-CM | POA: Diagnosis not present

## 2020-06-30 DIAGNOSIS — I10 Essential (primary) hypertension: Secondary | ICD-10-CM | POA: Diagnosis not present

## 2020-06-30 DIAGNOSIS — R1310 Dysphagia, unspecified: Secondary | ICD-10-CM | POA: Diagnosis not present

## 2020-06-30 DIAGNOSIS — Z48812 Encounter for surgical aftercare following surgery on the circulatory system: Secondary | ICD-10-CM | POA: Diagnosis not present

## 2020-07-01 DIAGNOSIS — R1310 Dysphagia, unspecified: Secondary | ICD-10-CM | POA: Diagnosis not present

## 2020-07-01 DIAGNOSIS — M6281 Muscle weakness (generalized): Secondary | ICD-10-CM | POA: Diagnosis not present

## 2020-07-01 DIAGNOSIS — Z952 Presence of prosthetic heart valve: Secondary | ICD-10-CM | POA: Diagnosis not present

## 2020-07-01 DIAGNOSIS — Z48812 Encounter for surgical aftercare following surgery on the circulatory system: Secondary | ICD-10-CM | POA: Diagnosis not present

## 2020-07-01 DIAGNOSIS — I2510 Atherosclerotic heart disease of native coronary artery without angina pectoris: Secondary | ICD-10-CM | POA: Diagnosis not present

## 2020-07-01 DIAGNOSIS — R269 Unspecified abnormalities of gait and mobility: Secondary | ICD-10-CM | POA: Diagnosis not present

## 2020-07-01 DIAGNOSIS — Z951 Presence of aortocoronary bypass graft: Secondary | ICD-10-CM | POA: Diagnosis not present

## 2020-07-02 ENCOUNTER — Ambulatory Visit: Payer: Self-pay | Admitting: Surgery

## 2020-07-02 DIAGNOSIS — M6281 Muscle weakness (generalized): Secondary | ICD-10-CM | POA: Diagnosis not present

## 2020-07-02 DIAGNOSIS — Z951 Presence of aortocoronary bypass graft: Secondary | ICD-10-CM | POA: Diagnosis not present

## 2020-07-02 DIAGNOSIS — Z952 Presence of prosthetic heart valve: Secondary | ICD-10-CM | POA: Diagnosis not present

## 2020-07-02 DIAGNOSIS — R269 Unspecified abnormalities of gait and mobility: Secondary | ICD-10-CM | POA: Diagnosis not present

## 2020-07-02 DIAGNOSIS — R1310 Dysphagia, unspecified: Secondary | ICD-10-CM | POA: Diagnosis not present

## 2020-07-02 DIAGNOSIS — Z48812 Encounter for surgical aftercare following surgery on the circulatory system: Secondary | ICD-10-CM | POA: Diagnosis not present

## 2020-07-02 DIAGNOSIS — I2510 Atherosclerotic heart disease of native coronary artery without angina pectoris: Secondary | ICD-10-CM | POA: Diagnosis not present

## 2020-07-03 DIAGNOSIS — M6281 Muscle weakness (generalized): Secondary | ICD-10-CM | POA: Diagnosis not present

## 2020-07-03 DIAGNOSIS — R1310 Dysphagia, unspecified: Secondary | ICD-10-CM | POA: Diagnosis not present

## 2020-07-03 DIAGNOSIS — Z48812 Encounter for surgical aftercare following surgery on the circulatory system: Secondary | ICD-10-CM | POA: Diagnosis not present

## 2020-07-03 DIAGNOSIS — Z952 Presence of prosthetic heart valve: Secondary | ICD-10-CM | POA: Diagnosis not present

## 2020-07-03 DIAGNOSIS — Z951 Presence of aortocoronary bypass graft: Secondary | ICD-10-CM | POA: Diagnosis not present

## 2020-07-03 DIAGNOSIS — I2510 Atherosclerotic heart disease of native coronary artery without angina pectoris: Secondary | ICD-10-CM | POA: Diagnosis not present

## 2020-07-03 DIAGNOSIS — R269 Unspecified abnormalities of gait and mobility: Secondary | ICD-10-CM | POA: Diagnosis not present

## 2020-07-04 ENCOUNTER — Telehealth (HOSPITAL_COMMUNITY): Payer: Self-pay

## 2020-07-04 DIAGNOSIS — R269 Unspecified abnormalities of gait and mobility: Secondary | ICD-10-CM | POA: Diagnosis not present

## 2020-07-04 DIAGNOSIS — M6281 Muscle weakness (generalized): Secondary | ICD-10-CM | POA: Diagnosis not present

## 2020-07-04 DIAGNOSIS — Z952 Presence of prosthetic heart valve: Secondary | ICD-10-CM | POA: Diagnosis not present

## 2020-07-04 DIAGNOSIS — R1310 Dysphagia, unspecified: Secondary | ICD-10-CM | POA: Diagnosis not present

## 2020-07-04 DIAGNOSIS — I2510 Atherosclerotic heart disease of native coronary artery without angina pectoris: Secondary | ICD-10-CM | POA: Diagnosis not present

## 2020-07-04 DIAGNOSIS — Z951 Presence of aortocoronary bypass graft: Secondary | ICD-10-CM | POA: Diagnosis not present

## 2020-07-04 DIAGNOSIS — Z48812 Encounter for surgical aftercare following surgery on the circulatory system: Secondary | ICD-10-CM | POA: Diagnosis not present

## 2020-07-04 NOTE — Telephone Encounter (Signed)
Pt insurance is active and benefits verified through [facility]. Co-pay $45.00, DED $0.00/$0.00 met, out of pocket $4,500.00/$0.00 met, co-insurance 0%. No pre-authorization required. Passport, 07/04/20 @ 12:03PM, AYO#[id number]  Will contact patient to see if he is interested in the Cardiac Rehab Program. If interested, patient will need to complete follow up appt. Once completed, patient will be contacted for scheduling upon review by the RN Navigator.

## 2020-07-04 NOTE — Telephone Encounter (Signed)
Attempted to call patient in regards to Cardiac Rehab - LM on VM 

## 2020-07-07 DIAGNOSIS — R1310 Dysphagia, unspecified: Secondary | ICD-10-CM | POA: Diagnosis not present

## 2020-07-07 DIAGNOSIS — R300 Dysuria: Secondary | ICD-10-CM | POA: Diagnosis not present

## 2020-07-07 DIAGNOSIS — Z48812 Encounter for surgical aftercare following surgery on the circulatory system: Secondary | ICD-10-CM | POA: Diagnosis not present

## 2020-07-07 DIAGNOSIS — M6281 Muscle weakness (generalized): Secondary | ICD-10-CM | POA: Diagnosis not present

## 2020-07-07 DIAGNOSIS — Z952 Presence of prosthetic heart valve: Secondary | ICD-10-CM | POA: Diagnosis not present

## 2020-07-07 DIAGNOSIS — I2510 Atherosclerotic heart disease of native coronary artery without angina pectoris: Secondary | ICD-10-CM | POA: Diagnosis not present

## 2020-07-07 DIAGNOSIS — Z951 Presence of aortocoronary bypass graft: Secondary | ICD-10-CM | POA: Diagnosis not present

## 2020-07-07 DIAGNOSIS — R269 Unspecified abnormalities of gait and mobility: Secondary | ICD-10-CM | POA: Diagnosis not present

## 2020-07-08 DIAGNOSIS — Z48812 Encounter for surgical aftercare following surgery on the circulatory system: Secondary | ICD-10-CM | POA: Diagnosis not present

## 2020-07-08 DIAGNOSIS — Z952 Presence of prosthetic heart valve: Secondary | ICD-10-CM | POA: Diagnosis not present

## 2020-07-08 DIAGNOSIS — M6281 Muscle weakness (generalized): Secondary | ICD-10-CM | POA: Diagnosis not present

## 2020-07-08 DIAGNOSIS — R1310 Dysphagia, unspecified: Secondary | ICD-10-CM | POA: Diagnosis not present

## 2020-07-08 DIAGNOSIS — R269 Unspecified abnormalities of gait and mobility: Secondary | ICD-10-CM | POA: Diagnosis not present

## 2020-07-08 DIAGNOSIS — D649 Anemia, unspecified: Secondary | ICD-10-CM | POA: Diagnosis not present

## 2020-07-08 DIAGNOSIS — I2510 Atherosclerotic heart disease of native coronary artery without angina pectoris: Secondary | ICD-10-CM | POA: Diagnosis not present

## 2020-07-08 DIAGNOSIS — Z951 Presence of aortocoronary bypass graft: Secondary | ICD-10-CM | POA: Diagnosis not present

## 2020-07-09 DIAGNOSIS — N183 Chronic kidney disease, stage 3 unspecified: Secondary | ICD-10-CM | POA: Diagnosis not present

## 2020-07-09 DIAGNOSIS — Z48812 Encounter for surgical aftercare following surgery on the circulatory system: Secondary | ICD-10-CM | POA: Diagnosis not present

## 2020-07-09 DIAGNOSIS — R269 Unspecified abnormalities of gait and mobility: Secondary | ICD-10-CM | POA: Diagnosis not present

## 2020-07-09 DIAGNOSIS — R1310 Dysphagia, unspecified: Secondary | ICD-10-CM | POA: Diagnosis not present

## 2020-07-09 DIAGNOSIS — I4891 Unspecified atrial fibrillation: Secondary | ICD-10-CM | POA: Diagnosis not present

## 2020-07-09 DIAGNOSIS — M6281 Muscle weakness (generalized): Secondary | ICD-10-CM | POA: Diagnosis not present

## 2020-07-09 DIAGNOSIS — I2510 Atherosclerotic heart disease of native coronary artery without angina pectoris: Secondary | ICD-10-CM | POA: Diagnosis not present

## 2020-07-09 DIAGNOSIS — Z952 Presence of prosthetic heart valve: Secondary | ICD-10-CM | POA: Diagnosis not present

## 2020-07-09 DIAGNOSIS — Z951 Presence of aortocoronary bypass graft: Secondary | ICD-10-CM | POA: Diagnosis not present

## 2020-07-09 DIAGNOSIS — I10 Essential (primary) hypertension: Secondary | ICD-10-CM | POA: Diagnosis not present

## 2020-07-10 DIAGNOSIS — Z48812 Encounter for surgical aftercare following surgery on the circulatory system: Secondary | ICD-10-CM | POA: Diagnosis not present

## 2020-07-10 DIAGNOSIS — M6281 Muscle weakness (generalized): Secondary | ICD-10-CM | POA: Diagnosis not present

## 2020-07-10 DIAGNOSIS — Z952 Presence of prosthetic heart valve: Secondary | ICD-10-CM | POA: Diagnosis not present

## 2020-07-10 DIAGNOSIS — R269 Unspecified abnormalities of gait and mobility: Secondary | ICD-10-CM | POA: Diagnosis not present

## 2020-07-10 DIAGNOSIS — R1310 Dysphagia, unspecified: Secondary | ICD-10-CM | POA: Diagnosis not present

## 2020-07-10 DIAGNOSIS — I2510 Atherosclerotic heart disease of native coronary artery without angina pectoris: Secondary | ICD-10-CM | POA: Diagnosis not present

## 2020-07-10 DIAGNOSIS — Z951 Presence of aortocoronary bypass graft: Secondary | ICD-10-CM | POA: Diagnosis not present

## 2020-07-11 DIAGNOSIS — Z951 Presence of aortocoronary bypass graft: Secondary | ICD-10-CM | POA: Diagnosis not present

## 2020-07-11 DIAGNOSIS — I2510 Atherosclerotic heart disease of native coronary artery without angina pectoris: Secondary | ICD-10-CM | POA: Diagnosis not present

## 2020-07-11 DIAGNOSIS — M6281 Muscle weakness (generalized): Secondary | ICD-10-CM | POA: Diagnosis not present

## 2020-07-11 DIAGNOSIS — Z48812 Encounter for surgical aftercare following surgery on the circulatory system: Secondary | ICD-10-CM | POA: Diagnosis not present

## 2020-07-11 DIAGNOSIS — R269 Unspecified abnormalities of gait and mobility: Secondary | ICD-10-CM | POA: Diagnosis not present

## 2020-07-11 DIAGNOSIS — Z952 Presence of prosthetic heart valve: Secondary | ICD-10-CM | POA: Diagnosis not present

## 2020-07-11 DIAGNOSIS — R1310 Dysphagia, unspecified: Secondary | ICD-10-CM | POA: Diagnosis not present

## 2020-07-14 ENCOUNTER — Ambulatory Visit: Payer: Medicare HMO | Admitting: Physician Assistant

## 2020-07-14 DIAGNOSIS — Z48812 Encounter for surgical aftercare following surgery on the circulatory system: Secondary | ICD-10-CM | POA: Diagnosis not present

## 2020-07-14 DIAGNOSIS — I2510 Atherosclerotic heart disease of native coronary artery without angina pectoris: Secondary | ICD-10-CM | POA: Diagnosis not present

## 2020-07-14 DIAGNOSIS — M6281 Muscle weakness (generalized): Secondary | ICD-10-CM | POA: Diagnosis not present

## 2020-07-14 DIAGNOSIS — Z952 Presence of prosthetic heart valve: Secondary | ICD-10-CM | POA: Diagnosis not present

## 2020-07-14 DIAGNOSIS — R1310 Dysphagia, unspecified: Secondary | ICD-10-CM | POA: Diagnosis not present

## 2020-07-14 DIAGNOSIS — Z951 Presence of aortocoronary bypass graft: Secondary | ICD-10-CM | POA: Diagnosis not present

## 2020-07-14 DIAGNOSIS — R269 Unspecified abnormalities of gait and mobility: Secondary | ICD-10-CM | POA: Diagnosis not present

## 2020-07-15 DIAGNOSIS — R0989 Other specified symptoms and signs involving the circulatory and respiratory systems: Secondary | ICD-10-CM | POA: Diagnosis not present

## 2020-07-15 DIAGNOSIS — Z951 Presence of aortocoronary bypass graft: Secondary | ICD-10-CM | POA: Diagnosis not present

## 2020-07-15 DIAGNOSIS — M6281 Muscle weakness (generalized): Secondary | ICD-10-CM | POA: Diagnosis not present

## 2020-07-15 DIAGNOSIS — R0602 Shortness of breath: Secondary | ICD-10-CM | POA: Diagnosis not present

## 2020-07-15 DIAGNOSIS — Z48812 Encounter for surgical aftercare following surgery on the circulatory system: Secondary | ICD-10-CM | POA: Diagnosis not present

## 2020-07-15 DIAGNOSIS — Z952 Presence of prosthetic heart valve: Secondary | ICD-10-CM | POA: Diagnosis not present

## 2020-07-15 DIAGNOSIS — R1310 Dysphagia, unspecified: Secondary | ICD-10-CM | POA: Diagnosis not present

## 2020-07-15 DIAGNOSIS — I2510 Atherosclerotic heart disease of native coronary artery without angina pectoris: Secondary | ICD-10-CM | POA: Diagnosis not present

## 2020-07-15 DIAGNOSIS — R269 Unspecified abnormalities of gait and mobility: Secondary | ICD-10-CM | POA: Diagnosis not present

## 2020-07-16 ENCOUNTER — Telehealth: Payer: Self-pay | Admitting: Cardiology

## 2020-07-16 DIAGNOSIS — R1310 Dysphagia, unspecified: Secondary | ICD-10-CM | POA: Diagnosis not present

## 2020-07-16 DIAGNOSIS — I2510 Atherosclerotic heart disease of native coronary artery without angina pectoris: Secondary | ICD-10-CM | POA: Diagnosis not present

## 2020-07-16 DIAGNOSIS — Z48812 Encounter for surgical aftercare following surgery on the circulatory system: Secondary | ICD-10-CM | POA: Diagnosis not present

## 2020-07-16 DIAGNOSIS — M6281 Muscle weakness (generalized): Secondary | ICD-10-CM | POA: Diagnosis not present

## 2020-07-16 DIAGNOSIS — I10 Essential (primary) hypertension: Secondary | ICD-10-CM | POA: Diagnosis not present

## 2020-07-16 DIAGNOSIS — Z952 Presence of prosthetic heart valve: Secondary | ICD-10-CM | POA: Diagnosis not present

## 2020-07-16 DIAGNOSIS — R269 Unspecified abnormalities of gait and mobility: Secondary | ICD-10-CM | POA: Diagnosis not present

## 2020-07-16 DIAGNOSIS — Z951 Presence of aortocoronary bypass graft: Secondary | ICD-10-CM | POA: Diagnosis not present

## 2020-07-16 DIAGNOSIS — I5030 Unspecified diastolic (congestive) heart failure: Secondary | ICD-10-CM | POA: Diagnosis not present

## 2020-07-16 NOTE — Telephone Encounter (Signed)
Linda nurse at [facility] called to  Give information about patient   patient is short of breath  , fluid retention    Patient  Had chest xray  Yesterday - strted on doxycycline 100 mg ticew a day    per Nurse - Vaughan Basta   Weight gain of more than 4 lbs 07/11/20  181.4 lb 07/14/20   185 lb Patient received 20 mg lasix 07/15/20   186.0 lb patient received 10 mg lasix 07/16/20    181.4 lb Patient received 10 mg lasix  Patient received morning medication  tamuolsin  0.4 mg Amiodarone 200 mg d Multivitamin  Colace  Eliquis 5 mg Macrobid 100 mg  LAsix 10 mg  doxycycline  100  Mg   lower extremity edema- pitting   oxygen sat in the 80's ( high )  with any type of movement and whe not on oxygen  2- 3 liter dependent oxygen. B/p today  124/78 pulse 52 Yesterday  149/52  Vaughan Basta stated  Patient will possible discharge from nursing rehab  Facility tomorrow. Patient missed appointment earlier this week 07/14/20)  due to the weather.   Schedule appointment with Dr Martinique Friday 07/18/20 at 2 pm .   RN  Informed nurse will give information to [facility] or Doctor of the Day. Vaughan Basta verbalized understanding.   RN  Reported information to [employer]

## 2020-07-16 NOTE — Telephone Encounter (Signed)
Called and spoke with Vaughan Basta, RN who states patient has had progressive shortness of breath, bilateral pedal/ankle edema, and weight gain recently. Patient reportedly has been on 2-3L of O2 that last week which is new since recent discharge. He was up 4 lbs from baseline on 07/14/2020. He received Lasix 20mg  daily at that time, 10mg  yesterday, and another 10mg  today. His weight and lower extremity edema has improved with Lasix. Chest x-ray was taken yesterday and reportedly showed pleural effusion. RN was unsure if chest x-ray showed signs concerning for pneumonia but he was started on Doxycycline. Last CMP from 12/23 reportedly showed creatinine of 1.64. Asked RN to have BMP and BNP checked and fax [place] the results. Also asked for chest x-ray report to be faxed. Depending on results, may be increase Lasix to 40mg  daily. However, it sounds like patient may have been diagnosed with pneumonia. We have gone ahead and scheduled patient a visit with Dr. Martinique this Friday 07/18/2020 at [place], PA-C 07/16/2020 3:03 PM

## 2020-07-16 NOTE — Telephone Encounter (Signed)
New Message     Nurse is calling to speak to [facility] , concerning this pt. She said was told to ask for Dr Martinique or Sande Rives.

## 2020-07-17 ENCOUNTER — Telehealth: Payer: Self-pay | Admitting: Cardiology

## 2020-07-17 DIAGNOSIS — I5030 Unspecified diastolic (congestive) heart failure: Secondary | ICD-10-CM | POA: Diagnosis not present

## 2020-07-17 DIAGNOSIS — J90 Pleural effusion, not elsewhere classified: Secondary | ICD-10-CM | POA: Diagnosis not present

## 2020-07-17 DIAGNOSIS — I10 Essential (primary) hypertension: Secondary | ICD-10-CM | POA: Diagnosis not present

## 2020-07-17 NOTE — Telephone Encounter (Signed)
New Message:    She wants to know if pt needs xrays before his appt tomorrow?

## 2020-07-17 NOTE — Telephone Encounter (Signed)
    Pt's wife wanted to speak with Dr. Doug Sou nurse

## 2020-07-17 NOTE — Progress Notes (Signed)
Cardiology Office Note   Date:  07/18/2020   ID:  [name] [name], DOB [birth year], MRN [id number]  PCP:  [name], MD  Cardiologist:   Emillio Ngo Martinique, MD   Chief Complaint  Patient presents with  . Congestive Heart Failure      History of Present Illness: [name] [name] is a 87 y.o. male who is seen for follow up CHF, progressive AS. He has a history of venous insufficiency with chronic lower extremity edema (mostly on the left), hypertension, hyperlipidemia, peripheral neuropathy, essential tremor, and prostate cancer.  Patient first seen  in 09/2012 for evaluation of dyspnea on exertion and lower extremity edema, especially in his left leg. Venous dopplers were ordered and showed no DVT. Echo showed LVEF of 55-60% with moderate asymmetric basal septal hypertrophy with no LV outflow tract gradient and mild to moderate MR. He was seen  in 07/2017 at which time he was doing well. He denied any chest pain or shortness of breath. He continued to report some lower extremity edema that resolved with elevation of legs at night. Edema felt to be due to venous insufficiency and conservative management with sodium restriction, elevation of legs, and compression stockings was recommended.   He was diagnosed with right middle lobe pneumonia on 10/12/2019 after presenting for further evaluation of fatigue while on vacation at the beach.. He tested negative for COVID. He was started on Levaquin but this had to be stopped when he developed tendonitis. He was started on Azithromycin instead on 10/26/2019. Repeat chest x-ray on 10/26/2019 showed increased density of right lower lobe pneumonia. He had persistent fatigue  and developed significant lower extremity edema and weight gain. Patient was seen by PCP again on 11/14/2019. Repeat chest x-ray showed continued right basilar pneumonia and increased right pleural effusion. BNP was checked and came back elevated at 1,380. He was started on Lasix 29m daily. Chest  CT was also ordered to rule out underlying lung mass. CT was performed on5/20/2021andshowed mild cardiomegaly, moderate right pleural effusion with adjacent atelectasis or pneumonia in the right lower and middle lobes as well as coronary artery calcifications.Of note, prior to being started on Lasix, he was seen by Urology for urinary retention and had a catheter placed.  He was taking lasix 80 mg daily but this has been reduced to 40 mg. Foley catheter was removed and he is now in and out cathing himself every other day.  He had an Echo done in June showing EF 40-45% with severe HK of the entire inferior wall. There was severe basal septal hypertrophy with gr 2 diastolic dysfunction. Moderate MR, mild AS and mild Aortic root enlargement. We started him on losartan 25 mg daily but dose had to be reduced to 12.5 mg due to hypotension. He was seen in September with worsening CHF with weight gain, edema and worsening fatigue. Lasix dose was increased. Echo showed further decline in LV function with EF 30-35%. AS was felt to be moderate to severe ( low flow AS).   He subsequently underwent cardiac cath. This demonstrated severe 3 vessel CAD with complex anatomy as well as low flow severe AS.  He was seen in consultation with Dr BCyndia Bentand underwent combined AVR with a #29 Edwards Inspiris Resilia valve and CABG with LIMA to the LAD, sequential SVG to OM1 and OM2, and SVG to the PDA. His post op course was complicated by persistent low output CHF and hypotension. No beta blocker due to hypotension. No digoxin due  to some AV block. Was followed inpatient by the Heart failure service. Was on Dobutamine until Dec 18 then weaned off. He had some post op Afib but converted to NSR. He was placed on midodrine for hypotension. He had significant debility and was evaluated by OT an PT. He was treated for PNA. Repeat Echo showed EF 35% with good AVR prosthesis function. He was DC to SNF on 06/26/20.   Received call from  facility RN yesterday noting increased SOB, weight gain, and O2 requirement. Given lasix 20 mg daily with some improvement. CXR reportedly showed pleural effusions with possible PNA and he was started on doxycycline. We requested a copy of CXR and also recommended BMET and BNP. Follow up here today.   Patient is seen with his wife. They are unhappy with his Rehab therapy. Was placed in quarantine for the first 2 weeks so couldn't leave his room to go down for Rehab therapies. Then when he could there was bad weather and not enough staffing. He thinks he has regressed since he was in the hospital. Legs are still very weak and he has difficult standing. Appetite remains poor. Feels bloated and has constipation. Noted weight was 171 lbs when he went to Rehab. Now up to 184. Noted increased edema and SOB. Still has to I/O cath. BP has improved.    Past Medical History:  Diagnosis Date  . Aortic stenosis   . Arthritis   . CAD (coronary artery disease)   . Colitis   . Edema   . Essential hypertension    no med now  . Essential tremor   . Hyperlipidemia   . Hypotonic bladder    L4-L5  . Obstructive uropathy   . Peripheral neuropathy    Elevated by Dr. love 2010  . Prostate cancer ([place]) 2005    Past Surgical History:  Procedure Laterality Date  . ACHILLES TENDON SURGERY Bilateral 1995  . AORTIC VALVE REPLACEMENT N/A 05/23/2020   Procedure: AORTIC VALVE REPLACEMENT (AVR) USING EDWARDS RESILIA 29 MM AORTIC VALVE.;  Surgeon: Gaye Pollack, MD;  Location: [facility] OR;  Service: Open Heart Surgery;  Laterality: N/A;  . COLONOSCOPY     With polyp resection  . CORONARY ARTERY BYPASS GRAFT N/A 05/23/2020   Procedure: CORONARY ARTERY BYPASS GRAFTING (CABG) USING LIMA to LAD; ENDOSCOPIC HARVESTED RIGHT GREATER SAPHENOUS VEIN: SVG to PD; SVG to SEQUENCED INTERMEDIATE to OM;  Surgeon: Gaye Pollack, MD;  Location[facility];  Service: Open Heart Surgery;  Laterality: N/A;  . ENDOVEIN HARVEST OF GREATER  SAPHENOUS VEIN Right 05/23/2020   Procedure: ENDOVEIN HARVEST OF GREATER SAPHENOUS VEIN;  Surgeon: Gaye Pollack, MD;  Location: [facility];  Service: Open Heart Surgery;  Laterality: Right;  . REVERSE SHOULDER ARTHROPLASTY Right 06/04/2016  . REVERSE SHOULDER ARTHROPLASTY Right 06/04/2016   Procedure: REVERSE SHOULDER ARTHROPLASTY;  Surgeon: Netta Cedars, MD;  Location: [facility];  Service: Orthopedics;  Laterality: Right;  . RIGHT/LEFT HEART CATH AND CORONARY ANGIOGRAPHY N/A 05/20/2020   Procedure: RIGHT/LEFT HEART CATH AND CORONARY ANGIOGRAPHY;  Surgeon: Martinique, Azizi Bally M, MD;  Location: [facility];  Service: Cardiovascular;  Laterality: N/A;  . SKIN CANCER EXCISION  1974   On chest wall  . TEE WITHOUT CARDIOVERSION N/A 05/23/2020   Procedure: TRANSESOPHAGEAL ECHOCARDIOGRAM (TEE);  Surgeon: Gaye Pollack, MD;  Location: [facility];  Service: Open Heart Surgery;  Laterality: N/A;  . TOTAL KNEE ARTHROPLASTY Right 2011  . TOTAL KNEE ARTHROPLASTY Left 04/09/2013  Procedure: LEFT TOTAL KNEE ARTHROPLASTY;  Surgeon: Gearlean Alf, MD;  Location: [facility];  Service: Orthopedics;  Laterality: Left;     Current Outpatient Medications  Medication Sig Dispense Refill  . acetaminophen (TYLENOL) 325 MG tablet Take 2 tablets (650 mg total) by mouth every 6 (six) hours as needed.    Marland Kitchen amiodarone (PACERONE) 200 MG tablet Take 1 tablet (200 mg total) by mouth 2 (two) times daily. For 7 days, then once daily    . apixaban (ELIQUIS) 5 MG TABS tablet Take 1 tablet (5 mg total) by mouth 2 (two) times daily. 60 tablet   . aspirin 81 MG tablet Take 81 mg by mouth at bedtime.     Marland Kitchen dextromethorphan-guaiFENesin (MUCINEX DM) 30-600 MG 12hr tablet Take 1 tablet by mouth 2 (two) times daily as needed for cough.    . docusate sodium (COLACE) 100 MG capsule Take 1 capsule (100 mg total) by mouth 2 (two) times daily. 10 capsule 0  . doxycycline (ADOXA) 100 MG tablet Take 1 tablet (100 mg total) by mouth 2 (two) times daily.     . feeding supplement (ENSURE ENLIVE / ENSURE PLUS) LIQD Take 237 mLs by mouth 4 (four) times daily. 237 mL 12  . ferrous [id number] C-folic acid (TRINSICON / FOLTRIN) capsule Take 1 capsule by mouth 2 (two) times daily after a meal.    . melatonin 5 MG TABS Take 1 tablet (5 mg total) by mouth at bedtime as needed (sleep).  0  . midodrine (PROAMATINE) 5 MG tablet Take 1 tablet (5 mg total) by mouth 3 (three) times daily. 90 tablet   . Multiple Vitamins-Minerals (CENTRUM SILVER PO) Take 1 tablet by mouth daily.     . ondansetron (ZOFRAN) 4 MG tablet Take 1 tablet (4 mg total) by mouth every 8 (eight) hours as needed for nausea or vomiting. 20 tablet 0  . polyethylene glycol (MIRALAX / GLYCOLAX) 17 g packet Take 17 g by mouth daily as needed. 14 each 0  . tamsulosin (FLOMAX) 0.4 MG CAPS Take 0.4 mg by mouth 2 (two) times daily.     Marland Kitchen zolpidem (AMBIEN) 5 MG tablet Take 1 tablet (5 mg total) by mouth at bedtime as needed for sleep. 30 tablet 0   No current facility-administered medications for this visit.    Allergies:   Simvastatin    Social History:  The patient  reports that he has never smoked. He has never used smokeless tobacco. He reports current alcohol use of about 1.0 standard drink of alcohol per week. He reports that he does not use drugs.   Family History:  The patient's family history includes Diabetes in his mother; Heart attack in his brother and maternal aunt; Heart failure in his mother; Hypertension in his mother.    ROS:  Please see the history of present illness.   Otherwise, review of systems are positive for none.   All other systems are reviewed and negative.    PHYSICAL EXAM: VS:  BP 109/67   Pulse 63   Ht 5' 9"  (1.753 m)   Wt 184 lb (83.5 kg)   BMI 27.17 kg/m  , BMI Body mass index is 27.17 kg/m. GEN: elderly WM, well developed, in no acute distress  HEENT: normal  Neck: + JVD to 8 cm, no carotid bruits, or masses Cardiac: RRR; gr 1/6 systolic  murmur at the RUSB. No  rubs, or gallops.  Respiratory:  Bilateral reduced BS with dullness to percussion.  GI  soft, nontender, nondistended, + BS MS: no deformity or atrophy  Skin: warm and dry, no rash Ext: 2+ LE edema pretibial Neuro:  Strength and sensation are intact Psych: euthymic mood, full affect   EKG:  EKG is not ordered today. The ekg ordered today demonstrates N/A   Recent Labs: 01/02/2020: Pro B Natriuretic peptide (BNP) 1,167.0 05/16/2020: B Natriuretic Peptide 2,005.2 06/02/2020: ALT 20 06/13/2020: TSH 1.920 06/23/2020: BUN 28; Creatinine, Ser 1.21; Hemoglobin 10.3; Magnesium 2.2; Platelets 187; Potassium 4.7; Sodium 140    Lipid Panel No results found for: CHOL, TRIG, HDL, CHOLHDL, VLDL, LDLCALC, LDLDIRECT    Wt Readings from Last 3 Encounters:  07/18/20 184 lb (83.5 kg)  06/26/20 168 lb 10.4 oz (76.5 kg)  05/15/20 193 lb 6.4 oz (87.7 kg)    - 10/12/2019: WBC 7.6, Hgb 14.2, Plts 246. Na 140, K 4.4, Glucose 103, BUN 12, Cr 1.26. AST 29, ALT 21, Alk Phos 61, Total Bili 0.5. TSH 0.849. UA showed 37m of protein but was otherwise unremarkable.  - 10/26/2019: Cr 1.20.  - 11/14/2019: BNP 1,380. Na 140, K 4.6, Glucose 107, BUN 22, Cr 1.16. AST 26, ALT, Alk Phos 59. Dated 12/06/19: BUN 27, creatinine 1.4. CBC and CMET normal.  Labs dated1/20/22: BUN 18, creatinine 1.2. Lytes OK. Hgb 11.1. normal WBC. BNP 2010.   CXR report 07/15/20: moderate bilateral pleural effusions.   Other studies Reviewed: Additional studies/ records that were reviewed today include:  Echo 12/10/19: IMPRESSIONS    1. Left ventricular ejection fraction, by estimation, is 40 to 45%. The  left ventricle has mildly decreased function. The left ventricle  demonstrates global hypokinesis. There is severe left ventricular  hypertrophy of the basal-septal segment. Left  ventricular diastolic parameters are consistent with Grade II diastolic  dysfunction (pseudonormalization). There is severe  hypokinesis of the left  ventricular, entire inferior wall and inferoseptal wall.  2. Right ventricular systolic function is normal. The right ventricular  size is mildly enlarged. There is normal pulmonary artery systolic  pressure. The estimated right ventricular systolic pressure is 316.1mmHg.  3. Left atrial size was mild to moderately dilated.  4. The mitral valve is normal in structure. Moderate mitral valve  regurgitation. No evidence of mitral stenosis.  5. The aortic valve is tricuspid. Aortic valve regurgitation is not  visualized. Mild aortic valve stenosis. Aortic valve area, by VTI measures  1.51 cm. Aortic valve mean gradient measures 20.0 mmHg. Aortic valve Vmax  measures 2.46 m/s. The degree of AS  may be underestimated in the setting of LV dysfunction. rree-=--  6. Aortic dilatation noted. There is mild dilatation of the aortic root  and of the ascending aorta measuring 42 mm and 460mrespectively.  7. The inferior vena cava is dilated in size with >50% respiratory  variability, suggesting right atrial pressure of 8 mmHg.   Echo 04/01/20: IMPRESSIONS    1. LVEF has decreased from 12/10/2019, now 30-35% with LV dyssynchrony.  Aortic stenosis is most probably severe with low flow low gradient,  dimensionless index 0.26.  2. Left ventricular ejection fraction, by estimation, is 30 to 35%. The  left ventricle has moderately decreased function. The left ventricle  demonstrates global hypokinesis. The left ventricular internal cavity size  was mildly dilated. There is severe  asymmetric left ventricular hypertrophy of the basal-septal segment. Left  ventricular diastolic function could not be evaluated.  3. Right ventricular systolic function is mildly reduced. The right  ventricular size is mildly enlarged.  There is moderately elevated  pulmonary artery systolic pressure. The estimated right ventricular  systolic pressure is 69.4 mmHg.  4. Left atrial size was  severely dilated.  5. Right atrial size was moderately dilated.  6. The mitral valve is normal in structure. Moderate mitral valve  regurgitation. No evidence of mitral stenosis.  7. The aortic valve is normal in structure. Aortic valve regurgitation is  not visualized. Moderate to severe aortic valve stenosis. Aortic valve  mean gradient measures 24.0 mmHg.  8. Aortic dilatation noted. There is mild to moderate dilatation of the  ascending aorta, measuring 44 mm.  9. The inferior vena cava is dilated in size with <50% respiratory  variability, suggesting right atrial pressure of 15 mmHg.   ASSESSMENT AND PLAN:  1. Acute on chronic combined systolic/diastolic CHF. EF 30-35%.  Patient is s/p AVR for severe AS and CABG for severe multivessel CAD. Prolonged hospital course due to persistent CHF with low output. Now with significant weight gain for the past 2 weeks. Moderate bilateral pleural effusions. BNP is high.  - recommend lasix 40 mg bid for 4 days then 40 mg daily.  -daily weight - sodium and fluid restriction. - fortunately BP and renal function are improved. If he is able to tolerate diuresis may be able to get him on more CHF therapy. - will arrange follow up in the Heart failure clinic in a couple of weeks. - he is scheduled to see Dr Cyndia Bent on Feb 2. Plan to repeat CXR then.  - repeat BMET and BNP in one week.   2. Severe low flow AS.  - s/p AVR. Follow up Echo showed good valve function.   3. CAD severe s/p CABG. No active angina.   4. Post op Afib. On amiodarone. Appears to be regular rhythm today.  5. Severe deconditioning. Patient and wife are considering moving him home with home PT/OT/nursing. They feel his mental health and sleeping will improve at home.   6.  Hyperlipidemia - statin therapy held post op due to severe deconditioning. Will plan to resume once physical condition improved.  7. CKD. Stage 3a     Current medicines are reviewed at length  with the patient today.  The patient does not have concerns regarding medicines.  The following changes have been made:  See above  Labs/ tests ordered today include:   Orders Placed This Encounter  Procedures  . Basic metabolic panel  . B Nat Peptide     Disposition: see above.   Signed, Siria Calandro Martinique, MD  07/18/2020 4:54 PM    [facility] [address] [place], [place], [address] Phone [phone], Fax [phone]

## 2020-07-17 NOTE — Telephone Encounter (Signed)
Spoke to [place] at [facility] advised to keep appointment as planned with Dr.Jordan 07/18/20 at 2:00 pm.

## 2020-07-17 NOTE — Telephone Encounter (Signed)
Spoke to patient's wife.She stated husband was suppose to be discharged from [facility] today.Stated due to his weakness he will being staying longer.Advised to keep appointment as planned with Dr.Jordan tomorrow 1/21 at 2:00 pm.

## 2020-07-18 ENCOUNTER — Ambulatory Visit: Payer: Medicare HMO | Admitting: Cardiology

## 2020-07-18 ENCOUNTER — Other Ambulatory Visit: Payer: Self-pay

## 2020-07-18 ENCOUNTER — Encounter: Payer: Self-pay | Admitting: Cardiology

## 2020-07-18 VITALS — BP 109/67 | HR 63 | Ht 69.0 in | Wt 184.0 lb

## 2020-07-18 DIAGNOSIS — R5383 Other fatigue: Secondary | ICD-10-CM

## 2020-07-18 DIAGNOSIS — R0602 Shortness of breath: Secondary | ICD-10-CM | POA: Diagnosis not present

## 2020-07-18 DIAGNOSIS — I350 Nonrheumatic aortic (valve) stenosis: Secondary | ICD-10-CM

## 2020-07-18 DIAGNOSIS — I2510 Atherosclerotic heart disease of native coronary artery without angina pectoris: Secondary | ICD-10-CM

## 2020-07-18 DIAGNOSIS — N1831 Chronic kidney disease, stage 3a: Secondary | ICD-10-CM | POA: Diagnosis not present

## 2020-07-18 DIAGNOSIS — I5043 Acute on chronic combined systolic (congestive) and diastolic (congestive) heart failure: Secondary | ICD-10-CM | POA: Diagnosis not present

## 2020-07-18 NOTE — Patient Instructions (Signed)
Medication Instructions:  Take Lasix 40 mg twice a day for 4 days then take 40 mg daily  Continue all other medications *If you need a refill on your cardiac medications before your next appointment, please call your pharmacy*   Lab Work: Bmet and BNP Thursday 07/25/19 fax results to [facility] at fax # [phone]   Testing/Procedures: None ordered   Follow-Up: At [facility], you and your health needs are our priority.  As part of our continuing mission to provide you with exceptional heart care, we have created designated Provider Care Teams.  These Care Teams include your primary Cardiologist (physician) and Advanced Practice Providers (APPs -  Physician Assistants and Nurse Practitioners) who all work together to provide you with the care you need, when you need it.  We recommend signing up for the patient portal called "MyChart".  Sign up information is provided on this After Visit Summary.  MyChart is used to connect with patients for Virtual Visits (Telemedicine).  Patients are able to view lab/test results, encounter notes, upcoming appointments, etc.  Non-urgent messages can be sent to your provider as well.   To learn more about what you can do with MyChart, go to [email or web address].    Your next appointment:  Thursday 08/28/20 at 3:00 pm   The format for your next appointment: Office    Provider:  [facility] appointment

## 2020-07-22 DIAGNOSIS — I5022 Chronic systolic (congestive) heart failure: Secondary | ICD-10-CM | POA: Diagnosis not present

## 2020-07-22 DIAGNOSIS — I4891 Unspecified atrial fibrillation: Secondary | ICD-10-CM | POA: Diagnosis not present

## 2020-07-22 DIAGNOSIS — N183 Chronic kidney disease, stage 3 unspecified: Secondary | ICD-10-CM | POA: Diagnosis not present

## 2020-07-22 DIAGNOSIS — I2510 Atherosclerotic heart disease of native coronary artery without angina pectoris: Secondary | ICD-10-CM | POA: Diagnosis not present

## 2020-07-23 ENCOUNTER — Telehealth: Payer: Self-pay | Admitting: Cardiology

## 2020-07-23 NOTE — Telephone Encounter (Signed)
*  STAT* If patient is at the pharmacy, call can be transferred to refill team.   1. Which medications need to be refilled? (please list name of each medication and dose if known)  apixaban (ELIQUIS) 5 MG TABS tablet;   2. Which pharmacy/location (including street and city if local pharmacy) is medication to be sent to? [place], [place] - [address]  3. Do they need a 30 day or 90 day supply? 90 day

## 2020-07-24 ENCOUNTER — Ambulatory Visit: Payer: Medicare HMO | Admitting: Cardiology

## 2020-07-24 DIAGNOSIS — I5043 Acute on chronic combined systolic (congestive) and diastolic (congestive) heart failure: Secondary | ICD-10-CM | POA: Diagnosis not present

## 2020-07-24 DIAGNOSIS — R0602 Shortness of breath: Secondary | ICD-10-CM | POA: Diagnosis not present

## 2020-07-25 ENCOUNTER — Telehealth: Payer: Self-pay | Admitting: Cardiology

## 2020-07-25 ENCOUNTER — Other Ambulatory Visit: Payer: Self-pay | Admitting: *Deleted

## 2020-07-25 LAB — BASIC METABOLIC PANEL
BUN/Creatinine Ratio: 12 (ref 10–24)
BUN: 18 mg/dL (ref 10–36)
CO2: 29 mmol/L (ref 20–29)
Calcium: 9.4 mg/dL (ref 8.6–10.2)
Chloride: 92 mmol/L — ABNORMAL LOW (ref 96–106)
Creatinine, Ser: 1.47 mg/dL — ABNORMAL HIGH (ref 0.76–1.27)
GFR calc Af Amer: 48 mL/min/{1.73_m2} — ABNORMAL LOW (ref >59)
GFR calc non Af Amer: 41 mL/min/{1.73_m2} — ABNORMAL LOW (ref >59)
Glucose: 160 mg/dL — ABNORMAL HIGH (ref 65–99)
Potassium: 4.7 mmol/L (ref 3.5–5.2)
Sodium: 139 mmol/L (ref 134–144)

## 2020-07-25 LAB — BRAIN NATRIURETIC PEPTIDE: BNP: 1251.5 pg/mL — ABNORMAL HIGH (ref 0.0–100.0)

## 2020-07-25 MED ORDER — FUROSEMIDE 40 MG PO TABS: 40.0000 mg | ORAL_TABLET | Freq: Every day | ORAL | 11 refills | Status: DC

## 2020-07-25 MED ORDER — APIXABAN 5 MG PO TABS: 5.0000 mg | ORAL_TABLET | Freq: Two times a day (BID) | ORAL | 11 refills | Status: DC

## 2020-07-25 NOTE — Progress Notes (Signed)
Advanced Heart Failure Clinic Note   Referring Physician: PCP: [name], MD PCP-Cardiologist: Peter Martinique, MD  HF Cardiologist: Dr. Haroldine Laws  HPI: 87 y/o male, very active despite age before recent illness. Had been working 20-30 hr/week for [employer] and [employer].    H/o combined systolic and diastolic HF w/ recent progressive decline in LVEF. Echo in 2014 showed normal LVEF 55-60% w/ G1DD. Echo 6/21 showed mildly reduced EF down to 40-45%. Echo repeated again 10/21 and EF had further dropped to 30-35% w/ global HK. RV systolic function mildly reduced. Also noted to have mod-sever AS. LVOT/AV VTI ratio: 0.25 and moderate MR. Had subsequent R/LHC showing severe 3V CAD. CI preserved at 2.74.    Underwent CABG x 4 (LIMA-LAD, sequential SVG-OM1 and OM2, SVG- PDA) + tissue AVR by Dr. Cyndia Bent on 11/26. Post operative course c/b atrial fibrillation treated w/ amiodarone + difficulty weaning inotropes, fluid overload and hypotension. AHF consult to assist with management. He was discharged to [facility] 06/26/20. His discharge weight was 168 lbs.  He had a follow up appointment with Dr. Martinique 1/21 and he was volume overloaded. Lasix was increased to 40 mg bid x 4 days then reduced to 40 mg daily.  Today he presents for post-hospital HF follow up. Feeling fatigued all the time, even worse since leaving SNF. Unable to get around house w/o wife's assistance. Uses rolling walker. Wears oxygen 2L at night. Denies increasing SOB, CP, dizziness, edema, or PND/Orthopnea. Appetite ok, has to eat softened foods due to issues with dysphagia. No fever or chills. Weight at home 162 pounds. Taking all medications.   Cardiac Studies: Echo 05/20/20: LVEF has decreased from 12/10/2019, now 30-35% with LV dyssynchrony, mod decreased function, RVSP 55.2 mmHg, severe LAE, severe AS with mean gradient 24.0 mmgHg, moderate MR, mild to moderate dilatation of the ascending aorta, measuring 44 mm.  L/RHC  05/20/20: Mid RCA lesion is 90% stenosed, Mid RCA to Dist RCA lesion is 100% stenosed, Prox LAD to Mid LAD lesion is 85% stenosed, 1st Mrg lesion is 95% stenosed, Prox Cx to Mid Cx lesion is 100% stenosed, LV EDP is mildly elevated. Hemodynamic findings consistent with mild pulmonary hypertension. 2nd Diag lesion is 100% stenosed.  1. Severe complex 3 vessel obstructive CAD.  2. Low flow Aortic stenosis. The valve is severely calcified. By cath mean gradient is 14 mm Hg.  3. Mildly elevated LV filling pressures 4. Mild pulmonary HTN with mean PA pressure 22 mmHg 5. Cardiac index 2.74  ROS: All systems reviewed and negative except as per HPI.   Past Medical History:  Diagnosis Date  . Aortic stenosis   . Arthritis   . CAD (coronary artery disease)   . Colitis   . Edema   . Essential hypertension    no med now  . Essential tremor   . Hyperlipidemia   . Hypotonic bladder    L4-L5  . Obstructive uropathy   . Peripheral neuropathy    Elevated by Dr. love 2010  . Prostate cancer [facility]) 2005    Current Outpatient Medications  Medication Sig Dispense Refill  . amiodarone (PACERONE) 200 MG tablet Take 200 mg by mouth daily.    Marland Kitchen apixaban (ELIQUIS) 5 MG TABS tablet Take 1 tablet (5 mg total) by mouth 2 (two) times daily. 60 tablet 11  . aspirin 81 MG tablet Take 81 mg by mouth at bedtime.     . docusate sodium (COLACE) 100 MG capsule Take 1 capsule (  100 mg total) by mouth 2 (two) times daily. 10 capsule 0  . feeding supplement (ENSURE ENLIVE / ENSURE PLUS) LIQD Take 237 mLs by mouth 4 (four) times daily. 237 mL 12  . ferrous [id number] C-folic acid (TRINSICON / FOLTRIN) capsule Take 1 capsule by mouth 2 (two) times daily after a meal.    . furosemide (LASIX) 40 MG tablet Take 1 tablet (40 mg total) by mouth daily. 30 tablet 11  . melatonin 5 MG TABS Take 1 tablet (5 mg total) by mouth at bedtime as needed (sleep).  0  . Multiple Vitamins-Minerals (CENTRUM SILVER PO) Take 1  tablet by mouth daily.     . ondansetron (ZOFRAN) 4 MG tablet Take 1 tablet (4 mg total) by mouth every 8 (eight) hours as needed for nausea or vomiting. 20 tablet 0  . tamsulosin (FLOMAX) 0.4 MG CAPS Take 0.4 mg by mouth 2 (two) times daily.     . midodrine (PROAMATINE) 2.5 MG tablet Take 1 tablet (2.5 mg total) by mouth 3 (three) times daily with meals. 90 tablet 3   No current facility-administered medications for this encounter.   Allergies  Allergen Reactions  . Simvastatin Other (See Comments)    "caused neuropathy pain"    Social History   Socioeconomic History  . Marital status: Married    Spouse name: Not on file  . Number of children: Not on file  . Years of education: Not on file  . Highest education level: Not on file  Occupational History  . Not on file  Tobacco Use  . Smoking status: Never Smoker  . Smokeless tobacco: Never Used  Vaping Use  . Vaping Use: Never used  Substance and Sexual Activity  . Alcohol use: Yes    Alcohol/week: 1.0 standard drink    Types: 1 Glasses of wine per week    Comment: occ  . Drug use: No  . Sexual activity: Yes  Other Topics Concern  . Not on file  Social History Narrative  . Not on file   Social Determinants of Health   Financial Resource Strain: Not on file  Food Insecurity: Not on file  Transportation Needs: Not on file  Physical Activity: Not on file  Stress: Not on file  Social Connections: Not on file  Intimate Partner Violence: Not on file   Family History  Problem Relation Age of Onset  . Hypertension Mother   . Heart failure Mother   . Diabetes Mother   . Heart attack Brother   . Heart attack Maternal Aunt    Vitals:   07/28/20 1427  BP: 124/70  Pulse: 88  SpO2: 91%  Weight: 73.5 kg (162 lb)    Wt Readings from Last 3 Encounters:  07/28/20 73.5 kg (162 lb)  07/18/20 83.5 kg (184 lb)  06/26/20 76.5 kg (168 lb 10.4 oz)   PHYSICAL EXAM: General:  In wheelchair, frail appearing. No respiratory  difficulty. HEENT: HOH Neck: supple. no JVD. Carotids 2+ bilat; no bruits. No lymphadenopathy or thyromegaly appreciated. Cor: PMI nondisplaced. Irregularly irregular rate & rhythm. No rubs, gallops or murmurs. Lungs: clear, diminished in bases Abdomen: soft, nontender, nondistended. No hepatosplenomegaly. No bruits or masses. Good bowel sounds. Extremities: no cyanosis, clubbing, rash, edema Neuro: alert & oriented x 3, cranial nerves grossly intact. moves all 4 extremities w/o difficulty. Affect pleasant.  ECG: atrial flutter/fib 2:1, 86 bpm (personally reviewed by Dr. Haroldine Laws)  ReDs: 26%  ASSESSMENT & PLAN: 1. Severe 3V CAD: -  s/p CABGx 4 (LIMA-LAD, sequential SVG-OM1 and OM2, SVG- PDA) - No s/s ischemia. - Continue ASA 81 mg daily. - No ? blocker w/ post-cardiotomy shock and low BP. - Atorva started 12/7 and stopped 12/8. Resume when deconditioning resolves. - Has f/u with Dr. Cyndia Bent this week.   2. Aortic Stenosis, s/p tissue AVR (05/23/20) - Severe low flow low gradient AS. - AV prosthesis ok on post op echo. - Stable.   3.Chronic Biventricular Heart Failure:  - Echo in 2014 showed normal LVEF 55-60% w/ G1DD.  - Echo 10/21 and EF 30-35% w/ global HK. RV systolic function mildly reduced. - LHC this admit w/ severe 3V CAD>>ICM. Now s/p CABG. Also w/ severe AS, possibly contributing to LV dysfunction, now s/p SAVR.  - Post-op echo 12/1 EF 30-35%, RV mildly reduced. AV prothesis ok. - Portable echo today EF 30-35% (personally reviewed by Dr. Haroldine Laws). - NYHA IIIb, confounded by deconditioning & afib/flutter. Volume status low/normal today. - Consider adding Farxiga in future. - Continue lasix 40 mg daily. - Decrease midodrine to 2.5 mg tid. - Once off midodrine, consider adding Entresto/ARB. - Off digoxin with heart block.  - BMET today.    4. Atrial Fibrillation, post op.   - Back in atrial fibrillation/flutter today. Feels terrible. - Portable echo today EF  30-35% - Suspect he went out of rhythm w/i past month. - Continue amio 200 mg daily. - Continue Eliquis 5 mg bid. Has not missed any doses. - No bleeding issues. - CBC today, schedule DCCV for this week.  5. CKD IIIa - Baseline <1.0.  - BMET today.   6. Anemia - Expected ABLA from surgery.  - on po Iron. - CBC today.   7. Physical Deconditioning - Major issue currently.  - Home now from SNF. - Encouraged Ensure 2-3x/day.   8. Urinary Retention - Prior to admit I&O cath at home.  - Continues with I&O cath every 6 hours.    9. PNA  - CXR w/ bilateral infiltrates, L>R - No fever, chills, cough. Clear and present lung sounds on exam. - Completed Vanc + cefepime x 7 days - F/u CXR pending.   10. Insomnia - Sleep hygiene discussed with patient. - Will need to follow up with PCP.  11. Anxiety/Depression - Likely situational, returning home from SNF may help. - Consider addition of low-dose SSRI (sertraline shown to have additional benefit of platelet inhibition) - Follow up with PCP.  DCCV this week with Dr. Haroldine Laws.  Allena Katz, [place], FNP-BC 07/28/20   Patient seen and examined with the above-signed Advanced Practice Provider and/or Housestaff. I personally reviewed laboratory data, imaging studies and relevant notes. I independently examined the patient and formulated the important aspects of the plan. I have edited the note to reflect any of my changes or salient points. I have personally discussed the plan with the patient and/or family.  He is s/p recent AVR and CABG with prolong hospital course due to low output and failure to thrive. Has been improving steadily. However today feels worse. No s/s angina. Volume status ok. Rhythm is irregular and appears to be AF/AFL.   General:  Elderly. No resp difficulty HEENT: normal Neck: supple. no JVD. Carotids 2+ bilat; no bruits. No lymphadenopathy or thryomegaly appreciated. Cor: PMI nondisplaced. Irregular rate &  rhythm. No rubs, gallops or murmurs. Lungs: clear Abdomen: soft, nontender, nondistended. No hepatosplenomegaly. No bruits or masses. Good bowel sounds. Extremities: no cyanosis, clubbing, rash, edema Neuro: alert & orientedx3,  cranial nerves grossly intact. moves all 4 extremities w/o difficulty. Affect pleasant  Rhythm appears to be be recurrent AF/AFL.Remains on amio and Eliquis. Will arrange for DC-CV. Needs to continue rehab.   Total time spent 35 minutes. Over half that time spent discussing above.    Glori Bickers, MD  10:22 PM

## 2020-07-25 NOTE — Telephone Encounter (Signed)
*  STAT* If patient is at the pharmacy, call can be transferred to refill team.   1. Which medications need to be refilled? (please list name of each medication and dose if known) furosemide (LASIX) 40 MG tablet [[id number]]  2. Which pharmacy/location (including street and city if local pharmacy) is medication to be sent to? [place], [place] - [address]  3. Do they need a 30 day or 90 day supply? 90 day supply    Pt is almost out of both medications.

## 2020-07-25 NOTE — Telephone Encounter (Signed)
Spoke with patient's wife, valve replacement is not considered an organ or tissue transplant.

## 2020-07-25 NOTE — Patient Outreach (Signed)
[facility]) Care Management  07/25/2020  [name] [name] 1928-06-02 [id number]   Referral Date: 1/26 Referral Source: Insurance Date of Admission: 12/30 (From hospital) Diagnosis: S/P CABG & AVR Date of Discharge: 1/25 Facility: [place]: [place] attempt #1, unsuccessful, HIPAA compliant voce message left.     Plan: RN CM will send unsuccessful outreach letter and follow up within the next 3-4 business days.  [name], [place], MSN [place] [phone]

## 2020-07-25 NOTE — Telephone Encounter (Signed)
[name] [name] is calling stating [employer] is needing to know if the recent valve replacement he had is considered a transplant. If so they are needing to know if it is a tissue or organ transplant. Please advise.

## 2020-07-28 ENCOUNTER — Encounter (HOSPITAL_COMMUNITY): Payer: Self-pay

## 2020-07-28 ENCOUNTER — Ambulatory Visit (HOSPITAL_COMMUNITY)
Admission: RE | Admit: 2020-07-28 | Discharge: 2020-07-28 | Disposition: A | Discharge status: Home / Self Care | Payer: Medicare HMO | Source: Ambulatory Visit | Attending: Cardiology | Admitting: Cardiology

## 2020-07-28 ENCOUNTER — Other Ambulatory Visit: Payer: Self-pay

## 2020-07-28 ENCOUNTER — Other Ambulatory Visit (HOSPITAL_COMMUNITY): Payer: Self-pay

## 2020-07-28 VITALS — BP 124/70 | HR 88 | Wt 162.0 lb

## 2020-07-28 DIAGNOSIS — Z950 Presence of cardiac pacemaker: Secondary | ICD-10-CM | POA: Insufficient documentation

## 2020-07-28 DIAGNOSIS — I350 Nonrheumatic aortic (valve) stenosis: Secondary | ICD-10-CM

## 2020-07-28 DIAGNOSIS — I454 Nonspecific intraventricular block: Secondary | ICD-10-CM | POA: Insufficient documentation

## 2020-07-28 DIAGNOSIS — I5022 Chronic systolic (congestive) heart failure: Secondary | ICD-10-CM | POA: Diagnosis not present

## 2020-07-28 DIAGNOSIS — I4891 Unspecified atrial fibrillation: Secondary | ICD-10-CM | POA: Insufficient documentation

## 2020-07-28 DIAGNOSIS — I480 Paroxysmal atrial fibrillation: Secondary | ICD-10-CM | POA: Diagnosis not present

## 2020-07-28 LAB — BASIC METABOLIC PANEL
Anion gap: 12 (ref 5–15)
BUN: 19 mg/dL (ref 8–23)
CO2: 28 mmol/L (ref 22–32)
Calcium: 9.7 mg/dL (ref 8.9–10.3)
Chloride: 98 mmol/L (ref 98–111)
Creatinine, Ser: 1.42 mg/dL — ABNORMAL HIGH (ref 0.61–1.24)
GFR, Estimated: 47 mL/min — ABNORMAL LOW (ref >60)
Glucose, Bld: 96 mg/dL (ref 70–99)
Potassium: 4.2 mmol/L (ref 3.5–5.1)
Sodium: 138 mmol/L (ref 135–145)

## 2020-07-28 LAB — CBC
HCT: 38 % — ABNORMAL LOW (ref 39.0–52.0)
Hemoglobin: 11.8 g/dL — ABNORMAL LOW (ref 13.0–17.0)
MCH: 27.7 pg (ref 26.0–34.0)
MCHC: 31.1 g/dL (ref 30.0–36.0)
MCV: 89.2 fL (ref 80.0–100.0)
Platelets: 201 10*3/uL (ref 150–400)
RBC: 4.26 MIL/uL (ref 4.22–5.81)
RDW: 14.5 % (ref 11.5–15.5)
WBC: 5 10*3/uL (ref 4.0–10.5)
nRBC: 0 % (ref 0.0–0.2)

## 2020-07-28 MED ORDER — MIDODRINE HCL 2.5 MG PO TABS: 2.5000 mg | ORAL_TABLET | Freq: Three times a day (TID) | ORAL | 3 refills | Status: DC

## 2020-07-28 NOTE — Progress Notes (Signed)
ReDS Vest / Clip - 07/28/20 1500      ReDS Vest / Clip   Station Marker C    Ruler Value 30    ReDS Value Range Low volume    ReDS Actual Value 26

## 2020-07-28 NOTE — Patient Instructions (Addendum)
DECREASE Midodrine 2.5mg  (1 tablet) 3 times daily  Labs done today, your results will be available in MyChart, we will contact you for abnormal readings.  Your physician recommends that you schedule a follow-up appointment in: 3 weeks  If you have any questions or concerns before your next appointment please send [place] a message through [place] or call our office at [phone].    TO LEAVE A MESSAGE FOR THE NURSE SELECT OPTION 2, PLEASE LEAVE A MESSAGE INCLUDING: . YOUR NAME . DATE OF BIRTH . CALL BACK NUMBER . REASON FOR CALL**this is important as we prioritize the call backs  YOU WILL RECEIVE A CALL BACK THE SAME DAY AS LONG AS YOU CALL BEFORE 4:00 PM    You are scheduled for a Cardioversion on Thursday February 3rd with Dr. Haroldine Laws.  Please arrive at the [facility] (Main Entrance A) at [facility]: [address] [place], [place] [address] at 1:30 pm.   DIET: Nothing to eat or drink after midnight except a sip of water with medications (see medication instructions below)  Medication Instructions: Hold Lasix Thursday morning  Continue your anticoagulant: Eliquis and Aspirin   COVID TEST: Tuesday 2/1 at 2:45pm  [place], [place]  You must have a responsible person to drive you home and stay in the waiting area during your procedure. Failure to do so could result in cancellation.  Bring your insurance cards.  *Special Note: Every effort is made to have your procedure done on time. Occasionally there are emergencies that occur at the hospital that may cause delays. Please be patient if a delay does occur.

## 2020-07-29 ENCOUNTER — Telehealth: Payer: Self-pay | Admitting: Cardiology

## 2020-07-29 ENCOUNTER — Other Ambulatory Visit (HOSPITAL_COMMUNITY)
Admission: RE | Admit: 2020-07-29 | Discharge: 2020-07-29 | Disposition: A | Discharge status: Home / Self Care | Payer: Medicare HMO | Source: Ambulatory Visit | Attending: Internal Medicine | Admitting: Internal Medicine

## 2020-07-29 ENCOUNTER — Other Ambulatory Visit: Payer: Self-pay | Admitting: Surgery

## 2020-07-29 DIAGNOSIS — I5032 Chronic diastolic (congestive) heart failure: Secondary | ICD-10-CM | POA: Diagnosis not present

## 2020-07-29 DIAGNOSIS — Z952 Presence of prosthetic heart valve: Secondary | ICD-10-CM | POA: Diagnosis not present

## 2020-07-29 DIAGNOSIS — G629 Polyneuropathy, unspecified: Secondary | ICD-10-CM | POA: Diagnosis not present

## 2020-07-29 DIAGNOSIS — Z7901 Long term (current) use of anticoagulants: Secondary | ICD-10-CM | POA: Diagnosis not present

## 2020-07-29 DIAGNOSIS — E785 Hyperlipidemia, unspecified: Secondary | ICD-10-CM | POA: Diagnosis not present

## 2020-07-29 DIAGNOSIS — Z01812 Encounter for preprocedural laboratory examination: Secondary | ICD-10-CM | POA: Insufficient documentation

## 2020-07-29 DIAGNOSIS — N139 Obstructive and reflux uropathy, unspecified: Secondary | ICD-10-CM | POA: Diagnosis not present

## 2020-07-29 DIAGNOSIS — G250 Essential tremor: Secondary | ICD-10-CM | POA: Diagnosis not present

## 2020-07-29 DIAGNOSIS — Z20822 Contact with and (suspected) exposure to covid-19: Secondary | ICD-10-CM | POA: Insufficient documentation

## 2020-07-29 DIAGNOSIS — I110 Hypertensive heart disease with heart failure: Secondary | ICD-10-CM | POA: Diagnosis not present

## 2020-07-29 DIAGNOSIS — Z951 Presence of aortocoronary bypass graft: Secondary | ICD-10-CM

## 2020-07-29 DIAGNOSIS — I2510 Atherosclerotic heart disease of native coronary artery without angina pectoris: Secondary | ICD-10-CM | POA: Diagnosis not present

## 2020-07-29 DIAGNOSIS — M1990 Unspecified osteoarthritis, unspecified site: Secondary | ICD-10-CM | POA: Diagnosis not present

## 2020-07-29 DIAGNOSIS — R338 Other retention of urine: Secondary | ICD-10-CM | POA: Diagnosis not present

## 2020-07-29 DIAGNOSIS — N312 Flaccid neuropathic bladder, not elsewhere classified: Secondary | ICD-10-CM | POA: Diagnosis not present

## 2020-07-29 DIAGNOSIS — Z48812 Encounter for surgical aftercare following surgery on the circulatory system: Secondary | ICD-10-CM | POA: Diagnosis not present

## 2020-07-29 LAB — SARS CORONAVIRUS 2 (TAT 6-24 HRS): SARS Coronavirus 2: NEGATIVE

## 2020-07-29 MED ORDER — APIXABAN 5 MG PO TABS: 5.0000 mg | ORAL_TABLET | Freq: Two times a day (BID) | ORAL | 1 refills | Status: DC

## 2020-07-29 NOTE — Telephone Encounter (Signed)
*  STAT* If patient is at the pharmacy, call can be transferred to refill team.   1. Which medications need to be refilled? (please list name of each medication and dose if known) Eliquis   2. Which pharmacy/location (including street and city if local pharmacy) is medication to be sent to? [facility]   3. Do they need a 30 day or 90 day supply? [name]

## 2020-07-29 NOTE — Telephone Encounter (Signed)
40m, 83.5kg, scr1.42 07/28/20, lovw/jordan 07/18/20. refil request received for eliquis to be sent to [facility]

## 2020-07-30 ENCOUNTER — Ambulatory Visit
Admission: RE | Admit: 2020-07-30 | Discharge: 2020-07-30 | Disposition: A | Discharge status: Home / Self Care | Payer: Medicare HMO | Source: Ambulatory Visit | Attending: Surgery | Admitting: Surgery

## 2020-07-30 ENCOUNTER — Other Ambulatory Visit: Payer: Self-pay

## 2020-07-30 ENCOUNTER — Other Ambulatory Visit: Payer: Self-pay | Admitting: *Deleted

## 2020-07-30 ENCOUNTER — Encounter: Payer: Self-pay | Admitting: Surgery

## 2020-07-30 ENCOUNTER — Ambulatory Visit: Payer: Self-pay | Admitting: Surgery

## 2020-07-30 ENCOUNTER — Encounter: Payer: Self-pay | Admitting: *Deleted

## 2020-07-30 VITALS — BP 110/75 | HR 83 | Temp 98.1°F | Resp 20 | Ht 69.0 in | Wt 163.0 lb

## 2020-07-30 DIAGNOSIS — Z951 Presence of aortocoronary bypass graft: Secondary | ICD-10-CM

## 2020-07-30 DIAGNOSIS — M419 Scoliosis, unspecified: Secondary | ICD-10-CM | POA: Diagnosis not present

## 2020-07-30 DIAGNOSIS — Z952 Presence of prosthetic heart valve: Secondary | ICD-10-CM | POA: Diagnosis not present

## 2020-07-30 DIAGNOSIS — Z48812 Encounter for surgical aftercare following surgery on the circulatory system: Secondary | ICD-10-CM | POA: Diagnosis not present

## 2020-07-30 DIAGNOSIS — I2510 Atherosclerotic heart disease of native coronary artery without angina pectoris: Secondary | ICD-10-CM

## 2020-07-30 DIAGNOSIS — J90 Pleural effusion, not elsewhere classified: Secondary | ICD-10-CM | POA: Diagnosis not present

## 2020-07-30 DIAGNOSIS — N139 Obstructive and reflux uropathy, unspecified: Secondary | ICD-10-CM | POA: Diagnosis not present

## 2020-07-30 DIAGNOSIS — J8489 Other specified interstitial pulmonary diseases: Secondary | ICD-10-CM | POA: Diagnosis not present

## 2020-07-30 DIAGNOSIS — G250 Essential tremor: Secondary | ICD-10-CM | POA: Diagnosis not present

## 2020-07-30 DIAGNOSIS — M1990 Unspecified osteoarthritis, unspecified site: Secondary | ICD-10-CM | POA: Diagnosis not present

## 2020-07-30 DIAGNOSIS — I350 Nonrheumatic aortic (valve) stenosis: Secondary | ICD-10-CM

## 2020-07-30 DIAGNOSIS — I110 Hypertensive heart disease with heart failure: Secondary | ICD-10-CM | POA: Diagnosis not present

## 2020-07-30 DIAGNOSIS — Z4682 Encounter for fitting and adjustment of non-vascular catheter: Secondary | ICD-10-CM | POA: Diagnosis not present

## 2020-07-30 DIAGNOSIS — N312 Flaccid neuropathic bladder, not elsewhere classified: Secondary | ICD-10-CM | POA: Diagnosis not present

## 2020-07-30 DIAGNOSIS — I5032 Chronic diastolic (congestive) heart failure: Secondary | ICD-10-CM | POA: Diagnosis not present

## 2020-07-30 NOTE — Patient Outreach (Signed)
[facility]) Care Management  [place]  07/30/2020   [name] [name] Oct 18, 1927 [id number]   Referral Date: 1/26 Referral Source: Insurance Date of Admission: 12/30 (From hospital) Diagnosis: S/P CABG & AVR Date of Discharge: 1/25 Facility: [place]: [place] attempt #2, successful.  Identity verified.  This care manager introduced self and stated purpose of call.  [facility] care management services explained.     Social: Member lives with wife, was independent, working full time up until May 2021 when he retired.  Since his surgery on 11/19, he has become more dependent on his wife for assistance with ADL's.  Despite going to rehab post surgery, state he is still experiencing extreme weakness, having to depend on a walker for mobility, and unable to stand for a period of time independently.  Since being discharged from rehab, he has had evaluation from home health for PT, OT, and nursing.  They will be making several visits weekly.    Wife prepares all meals, heart healthy and low sodium.  She state he has lost about 20-25 pounds over the last several months, appetite is better now since being back home.  Report his chest and leg wounds have completely healed.    Conditions: Per chart, history of HTN, CHF, Aortic stenosis and CAD s/p CABG x4 and aortic valve repair, A-fib, BPH, CKD, and HLD.  He has frequent UTIs, chronic foley catheter use.    Medications: Reviewed with wife, state he is taking as instructed.  He has been placed on Eliquis for A-fib, was provided samples.  She will try to pick up prescription from pharmacy but it may be too expensive.  If this is the case, she will inform this care manager and referral to [place] will be placed.    Appointments: Was seen at the heart failure clinic on 1/31, urology for foley change yesterday, and will follow up with cardiothoracic surgeon today.  He is scheduled for cardioversion tomorrow.   Wife provides transportation to appointments.  They had ramp installed since discharge as member is now using wheelchair.  Consent: Agrees to involvement with [facility].  Denies any urgent concerns, encouraged to contact this care manager with questions.     Encounter Medications:  Outpatient Encounter Medications as of 07/30/2020  Medication Sig  . amiodarone (PACERONE) 200 MG tablet Take 200 mg by mouth daily.  Marland Kitchen apixaban (ELIQUIS) 5 MG TABS tablet Take 1 tablet (5 mg total) by mouth 2 (two) times daily.  Marland Kitchen aspirin 81 MG tablet Take 81 mg by mouth at bedtime.   . docusate sodium (COLACE) 100 MG capsule Take 1 capsule (100 mg total) by mouth 2 (two) times daily.  Marland Kitchen dutasteride (AVODART) 0.5 MG capsule Take 0.5 mg by mouth daily.  . feeding supplement (ENSURE ENLIVE / ENSURE PLUS) LIQD Take 237 mLs by mouth 4 (four) times daily. (Patient taking differently: Take 237 mLs by mouth 2 (two) times daily between meals.)  . ferrous [id number] C-folic acid (TRINSICON / FOLTRIN) capsule Take 1 capsule by mouth 2 (two) times daily after a meal.  . furosemide (LASIX) 40 MG tablet Take 1 tablet (40 mg total) by mouth daily. (Patient taking differently: Take 40 mg by mouth daily. Taking every other day)  . melatonin 5 MG TABS Take 1 tablet (5 mg total) by mouth at bedtime as needed (sleep).  . midodrine (PROAMATINE) 2.5 MG tablet Take 1 tablet (2.5 mg total) by mouth 3 (three) times daily with  meals.  . Multiple Vitamins-Minerals (CENTRUM SILVER PO) Take 1 tablet by mouth daily.   . ondansetron (ZOFRAN) 4 MG tablet Take 1 tablet (4 mg total) by mouth every 8 (eight) hours as needed for nausea or vomiting.  . tamsulosin (FLOMAX) 0.4 MG CAPS Take 0.4 mg by mouth 2 (two) times daily.    No facility-administered encounter medications on file as of 07/30/2020.    Functional Status:  In your present state of health, do you have any difficulty performing the following activities: 05/16/2020 05/16/2020   Hearing? - Y  Vision? - N  Difficulty concentrating or making decisions? - N  Walking or climbing stairs? - Y  Dressing or bathing? - N  Doing errands, shopping? N -  Some recent data might be hidden    Fall/Depression Screening: No flowsheet data found. PHQ 2/9 Scores 07/30/2020  PHQ - 2 Score 1  Exception Documentation (No Data)    Assessment:  Goals Addressed            This Visit's Progress   . [facility] - Improve My Heart Health-Coronary Artery Disease       Timeframe:  Long-Range Goal Priority:  High Start Date:        07/30/2020                     Expected End Date:       09/27/2020                Follow Up Date 08/06/2020   - I can manage, know and watch for signs of a heart attack - if I have chest pain, call for help - learn my personal risk factors    Why is this important?    Lifestyle changes are key to improving the blood flow to your heart. Think about the things you can change and set a goal to live healthy.   Remember, when the blood vessels to your heart start to get clogged you may not have any symptoms.   Over time, they can get worse.   Don't ignore the signs, like chest pain, and get help right away.     Notes:     . [facility] - Make and Keep All Appointments       Timeframe:  Short-Term Goal Priority:  Medium Start Date:         07/30/2020                    Expected End Date:     08/27/2020                  Follow Up Date 08/06/1009    - ask family or friend for a ride - call to cancel if needed - keep a calendar with prescription refill dates    Why is this important?    Part of staying healthy is seeing the doctor for follow-up care.   If you forget your appointments, there are some things you can do to stay on track.    Notes:        Plan:  Follow-up:  Patient agrees to Care Plan and Follow-up.  Will send education information regarding management of heart condition.  Will send [facility] calendar book and this care manager's contact information.   Will follow up with member/family within the next week.  [name], [place], MSN [place] [phone]

## 2020-07-30 NOTE — Progress Notes (Unsigned)
HPI: Patient returns for routine postoperative follow-up having undergone coronary artery bypass graft surgery x4 and aortic valve replacement using a 29 mm pericardial valve on 05/23/2020.  The patient presented with stage D, severe, symptomatic low flow, low gradient aortic stenosis with New York Heart Association class IV symptoms with ejection fraction of 30 to 35%. The patient's early postoperative recovery while in the hospital was notable for a very slow postoperative course with needs for vasopressor to maintain adequate blood pressure and milrinone for inotropic support.  He had significant fluid retention and was difficult to diurese.  He was seen by Dr. Haroldine Laws for help with managing his heart failure.  His postop course was also complicated by his advanced age, development of significant deconditioning, poor p.o. intake requiring tube feeds for a while, and development of pneumonia requiring intravenous antibiotics.  He gradually improved and was discharged on 06/26/2020 to [facility]  He and his wife did not feel that he was getting adequate physical therapy or occupational therapy there and she decided to take him home. Since skilled nursing facility discharge the patient reports that he has been very slowly improving.  His appetite is better.  He has lost a lot of weight.  He is walking around with a walker but remains weak and fatigues easily.  He denies any chest pain or pressure.  He denies shortness of breath.   Current Outpatient Medications  Medication Sig Dispense Refill  . amiodarone (PACERONE) 200 MG tablet Take 200 mg by mouth daily.    Marland Kitchen apixaban (ELIQUIS) 5 MG TABS tablet Take 1 tablet (5 mg total) by mouth 2 (two) times daily. 180 tablet 1  . Ascorbic Acid (VITAMIN C) 100 MG tablet Take 100 mg by mouth daily.    Marland Kitchen aspirin 81 MG tablet Take 81 mg by mouth at bedtime.     . docusate sodium (COLACE) 100 MG capsule Take 1 capsule (100 mg total) by mouth 2 (two) times  daily. 10 capsule 0  . dutasteride (AVODART) 0.5 MG capsule Take 0.5 mg by mouth daily.    . feeding supplement (ENSURE ENLIVE / ENSURE PLUS) LIQD Take 237 mLs by mouth 4 (four) times daily. (Patient taking differently: Take 237 mLs by mouth 2 (two) times daily between meals.) 237 mL 12  . ferrous [id number] C-folic acid (TRINSICON / FOLTRIN) capsule Take 1 capsule by mouth 2 (two) times daily after a meal.    . furosemide (LASIX) 40 MG tablet Take 1 tablet (40 mg total) by mouth daily. (Patient taking differently: Take 40 mg by mouth daily. Taking every other day) 30 tablet 11  . melatonin 5 MG TABS Take 1 tablet (5 mg total) by mouth at bedtime as needed (sleep).  0  . midodrine (PROAMATINE) 2.5 MG tablet Take 1 tablet (2.5 mg total) by mouth 3 (three) times daily with meals. 90 tablet 3  . Multiple Vitamins-Minerals (CENTRUM SILVER PO) Take 1 tablet by mouth daily.     . ondansetron (ZOFRAN) 4 MG tablet Take 1 tablet (4 mg total) by mouth every 8 (eight) hours as needed for nausea or vomiting. 20 tablet 0  . tamsulosin (FLOMAX) 0.4 MG CAPS Take 0.4 mg by mouth 2 (two) times daily.      No current facility-administered medications for this visit.    Physical Exam: BP 110/75 (BP Location: Right Arm, Patient Position: Sitting)   Pulse 83   Temp 98.1 F (36.7 C)   Resp 20  Ht 5\' 9"  (1.753 m)   Wt 163 lb (73.9 kg)   SpO2 91% Comment: RA  BMI 24.07 kg/m  He looks frail but better than he did at the time of discharge. Cardiac exam shows a regular rate and rhythm with normal heart sounds.  There is no murmur. Lung exam reveals decreased breath sounds in the bases. The chest incision is well-healed and the sternum is stable. His right leg incision is well-healed.  There is mild bilateral ankle edema.  Diagnostic Tests:  Narrative & Impression  CLINICAL DATA:  Status post CABG in 04/2020.  EXAM: CHEST - 2 VIEW  COMPARISON:  06/16/2020  FINDINGS: The enteric tube has  been removed. Sequelae of CABG and aortic valve replacement are again identified. The cardiac silhouette remains mildly enlarged. Pulmonary vascular congestion has decreased. Small bilateral pleural effusions have decreased. There are mild bibasilar interstitial and airspace opacities which have decreased, particularly on the left. No pneumothorax is identified. Prior right shoulder arthroplasty and thoracic dextroscoliosis are noted.  IMPRESSION: 1. Decreased pulmonary vascular congestion and small bilateral pleural effusions. 2. Mild bibasilar opacities, likely atelectasis and improved from prior.   Electronically Signed   By: Logan Bores M.D.   On: 07/30/2020 15:14      Impression:  He has had a very slow recovery up to this point now almost 11-week out from his surgery.  I think this is due to his advanced age, acute on chronic congestive heart failure at the time of admission, difficult postoperative course with congestive heart failure and failure to thrive with immobility.  He now seems to be making some progress at home but I think is probably going to take him another 3 months to recover from the surgery.  His congestive heart failure appears to be well managed at this time with minimal peripheral edema and improvement in his small bilateral pleural effusions.  He denies shortness of breath and is mainly troubled by weakness and fatigue which I think will take time and physical therapy to improve.  I encouraged him to continue working on nutrition and mobilization.  Plan:  He will continue to follow-up with Dr. Haroldine Laws in the heart failure clinic and I will plan to see him back in 2 months with a repeat chest x-ray.   Gaye Pollack, MD [facility] [phone]

## 2020-07-31 ENCOUNTER — Ambulatory Visit (HOSPITAL_COMMUNITY)
Admission: RE | Admit: 2020-07-31 | Discharge: 2020-07-31 | Disposition: A | Discharge status: Home / Self Care | Payer: Medicare HMO | Attending: Internal Medicine | Admitting: Internal Medicine

## 2020-07-31 ENCOUNTER — Other Ambulatory Visit: Payer: Self-pay

## 2020-07-31 ENCOUNTER — Ambulatory Visit: Payer: Medicare HMO | Admitting: *Deleted

## 2020-07-31 ENCOUNTER — Encounter (HOSPITAL_COMMUNITY): Payer: Self-pay | Admitting: Internal Medicine

## 2020-07-31 ENCOUNTER — Encounter (HOSPITAL_COMMUNITY): Payer: Self-pay | Admitting: Certified Registered Nurse Anesthetist

## 2020-07-31 ENCOUNTER — Encounter (HOSPITAL_COMMUNITY)
Admission: RE | Disposition: A | Discharge status: Home / Self Care | Payer: Self-pay | Source: Home / Self Care | Attending: Internal Medicine

## 2020-07-31 DIAGNOSIS — Z538 Procedure and treatment not carried out for other reasons: Secondary | ICD-10-CM | POA: Insufficient documentation

## 2020-07-31 DIAGNOSIS — I4891 Unspecified atrial fibrillation: Secondary | ICD-10-CM | POA: Diagnosis not present

## 2020-07-31 SURGERY — CANCELLED PROCEDURE

## 2020-07-31 NOTE — Progress Notes (Signed)
ECG and rhythm strip reviewed - patient appears to be in sinus rhythm with a long PR interval. No identifiable 2:1 flutter pattern or atrial fibrillation. Recommend follow up with HF clinic for further review.

## 2020-07-31 NOTE — Progress Notes (Signed)
On arrival patient noted to be in NSR. Dr.Acharya, reviewed ECG and discussed findings with patient. Procedure canceled and patient discharged to home.

## 2020-08-01 ENCOUNTER — Ambulatory Visit: Payer: Medicare HMO | Admitting: Adult Health

## 2020-08-01 DIAGNOSIS — N312 Flaccid neuropathic bladder, not elsewhere classified: Secondary | ICD-10-CM | POA: Diagnosis not present

## 2020-08-01 DIAGNOSIS — I110 Hypertensive heart disease with heart failure: Secondary | ICD-10-CM | POA: Diagnosis not present

## 2020-08-01 DIAGNOSIS — I5032 Chronic diastolic (congestive) heart failure: Secondary | ICD-10-CM | POA: Diagnosis not present

## 2020-08-01 DIAGNOSIS — Z48812 Encounter for surgical aftercare following surgery on the circulatory system: Secondary | ICD-10-CM | POA: Diagnosis not present

## 2020-08-01 DIAGNOSIS — I2510 Atherosclerotic heart disease of native coronary artery without angina pectoris: Secondary | ICD-10-CM | POA: Diagnosis not present

## 2020-08-01 DIAGNOSIS — G250 Essential tremor: Secondary | ICD-10-CM | POA: Diagnosis not present

## 2020-08-01 DIAGNOSIS — M1990 Unspecified osteoarthritis, unspecified site: Secondary | ICD-10-CM | POA: Diagnosis not present

## 2020-08-01 DIAGNOSIS — N139 Obstructive and reflux uropathy, unspecified: Secondary | ICD-10-CM | POA: Diagnosis not present

## 2020-08-01 DIAGNOSIS — Z952 Presence of prosthetic heart valve: Secondary | ICD-10-CM | POA: Diagnosis not present

## 2020-08-01 DIAGNOSIS — Z951 Presence of aortocoronary bypass graft: Secondary | ICD-10-CM | POA: Diagnosis not present

## 2020-08-02 NOTE — Addendum Note (Signed)
Encounter addended by: Jolaine Artist, MD on: 08/02/2020 10:23 PM  Actions taken: Level of Service modified, Visit diagnoses modified

## 2020-08-04 ENCOUNTER — Other Ambulatory Visit (HOSPITAL_COMMUNITY): Payer: Self-pay | Admitting: *Deleted

## 2020-08-04 DIAGNOSIS — I110 Hypertensive heart disease with heart failure: Secondary | ICD-10-CM | POA: Diagnosis not present

## 2020-08-04 DIAGNOSIS — N139 Obstructive and reflux uropathy, unspecified: Secondary | ICD-10-CM | POA: Diagnosis not present

## 2020-08-04 DIAGNOSIS — Z48812 Encounter for surgical aftercare following surgery on the circulatory system: Secondary | ICD-10-CM | POA: Diagnosis not present

## 2020-08-04 DIAGNOSIS — Z952 Presence of prosthetic heart valve: Secondary | ICD-10-CM | POA: Diagnosis not present

## 2020-08-04 DIAGNOSIS — I2510 Atherosclerotic heart disease of native coronary artery without angina pectoris: Secondary | ICD-10-CM | POA: Diagnosis not present

## 2020-08-04 DIAGNOSIS — Z951 Presence of aortocoronary bypass graft: Secondary | ICD-10-CM | POA: Diagnosis not present

## 2020-08-04 DIAGNOSIS — I5032 Chronic diastolic (congestive) heart failure: Secondary | ICD-10-CM | POA: Diagnosis not present

## 2020-08-04 DIAGNOSIS — M1990 Unspecified osteoarthritis, unspecified site: Secondary | ICD-10-CM | POA: Diagnosis not present

## 2020-08-04 DIAGNOSIS — G250 Essential tremor: Secondary | ICD-10-CM | POA: Diagnosis not present

## 2020-08-04 DIAGNOSIS — N312 Flaccid neuropathic bladder, not elsewhere classified: Secondary | ICD-10-CM | POA: Diagnosis not present

## 2020-08-04 MED ORDER — MIDODRINE HCL 2.5 MG PO TABS: 2.5000 mg | ORAL_TABLET | Freq: Three times a day (TID) | ORAL | 3 refills | Status: DC

## 2020-08-05 DIAGNOSIS — I110 Hypertensive heart disease with heart failure: Secondary | ICD-10-CM | POA: Diagnosis not present

## 2020-08-05 DIAGNOSIS — N139 Obstructive and reflux uropathy, unspecified: Secondary | ICD-10-CM | POA: Diagnosis not present

## 2020-08-05 DIAGNOSIS — I5032 Chronic diastolic (congestive) heart failure: Secondary | ICD-10-CM | POA: Diagnosis not present

## 2020-08-05 DIAGNOSIS — G250 Essential tremor: Secondary | ICD-10-CM | POA: Diagnosis not present

## 2020-08-05 DIAGNOSIS — I2510 Atherosclerotic heart disease of native coronary artery without angina pectoris: Secondary | ICD-10-CM | POA: Diagnosis not present

## 2020-08-05 DIAGNOSIS — N312 Flaccid neuropathic bladder, not elsewhere classified: Secondary | ICD-10-CM | POA: Diagnosis not present

## 2020-08-05 DIAGNOSIS — Z48812 Encounter for surgical aftercare following surgery on the circulatory system: Secondary | ICD-10-CM | POA: Diagnosis not present

## 2020-08-05 DIAGNOSIS — Z952 Presence of prosthetic heart valve: Secondary | ICD-10-CM | POA: Diagnosis not present

## 2020-08-05 DIAGNOSIS — M1990 Unspecified osteoarthritis, unspecified site: Secondary | ICD-10-CM | POA: Diagnosis not present

## 2020-08-05 DIAGNOSIS — Z951 Presence of aortocoronary bypass graft: Secondary | ICD-10-CM | POA: Diagnosis not present

## 2020-08-06 ENCOUNTER — Other Ambulatory Visit: Payer: Self-pay | Admitting: Cardiology

## 2020-08-06 ENCOUNTER — Other Ambulatory Visit: Payer: Self-pay | Admitting: *Deleted

## 2020-08-06 MED ORDER — AMIODARONE HCL 200 MG PO TABS: 200.0000 mg | ORAL_TABLET | Freq: Every day | ORAL | 3 refills | Status: DC

## 2020-08-06 NOTE — Telephone Encounter (Signed)
*  STAT* If patient is at the pharmacy, call can be transferred to refill team.   1. Which medications need to be refilled? (please list name of each medication and dose if known) amiodarone (PACERONE) 200 MG tablet  2. Which pharmacy/location (including street and city if local pharmacy) is medication to be sent to? [place], [place] - [address]  3. Do they need a 30 day or 90 day supply? 90 day   Patient's wife would like to know if Dr. Martinique will continue the patient's refills. She states he has 6 tablets left.

## 2020-08-06 NOTE — Patient Outreach (Addendum)
[facility]) Care Management  08/06/2020  [name] [name] 1927-08-04 [id number]   Weekly transition of care call placed to member/wife.  She report ongoing concern regarding his recovery.  He was supposed to have cardioversion on 2/3 however that was canceled because member was no longer in A-fib.  His vitals have been stable, HR less than 100, Oxygen levels greater then 93%, and BP range 110's-120's/70's-80's.  Her concern is that he is still having fatigue and weakness, only able to walk short distances with walker.  Was seen by cardiothoracic surgeon last week and reportedly was told member was healing appropriately and recovery would take time.  He continues to work with PT and OT for increasing strength.  She is worried that member may be getting depressed as he is used to being more mobile and worked up until the last year.  He has appointment with PCP next week, she will discuss possible medication at that time as he was on something while in the hospital.  He also has follow up with heart failure clinic on 2/21.  Denies any urgent concerns, encouraged to contact this care manager with questions.  Agrees to follow up within the next week.  Goals Addressed            This Visit's Progress   . [facility] - Improve My Heart Health-Coronary Artery Disease   On track    Timeframe:  Long-Range Goal Priority:  High Start Date:        07/30/2020                     Expected End Date:       09/27/2020                Follow Up Date 08/15/2020   - I can manage, know and watch for signs of a heart attack - if I have chest pain, call for help - learn my personal risk factors    Why is this important?    Lifestyle changes are key to improving the blood flow to your heart. Think about the things you can change and set a goal to live healthy.   Remember, when the blood vessels to your heart start to get clogged you may not have any symptoms.   Over time, they can get worse.   Don't ignore  the signs, like chest pain, and get help right away.     Notes:     . [facility] - Make and Keep All Appointments   On track    Timeframe:  Short-Term Goal Priority:  Medium Start Date:         07/30/2020                    Expected End Date:     08/27/2020                  Follow Up Date 08/15/1009    - ask family or friend for a ride - call to cancel if needed - keep a calendar with prescription refill dates    Why is this important?    Part of staying healthy is seeing the doctor for follow-up care.   If you forget your appointments, there are some things you can do to stay on track.    Notes:   2/9 - Upcoming appointments reviewed with wife        [name], RN, MSN [place]  Care Manager [phone]

## 2020-08-07 DIAGNOSIS — N139 Obstructive and reflux uropathy, unspecified: Secondary | ICD-10-CM | POA: Diagnosis not present

## 2020-08-07 DIAGNOSIS — I110 Hypertensive heart disease with heart failure: Secondary | ICD-10-CM | POA: Diagnosis not present

## 2020-08-07 DIAGNOSIS — Z951 Presence of aortocoronary bypass graft: Secondary | ICD-10-CM | POA: Diagnosis not present

## 2020-08-07 DIAGNOSIS — I2510 Atherosclerotic heart disease of native coronary artery without angina pectoris: Secondary | ICD-10-CM | POA: Diagnosis not present

## 2020-08-07 DIAGNOSIS — G250 Essential tremor: Secondary | ICD-10-CM | POA: Diagnosis not present

## 2020-08-07 DIAGNOSIS — N312 Flaccid neuropathic bladder, not elsewhere classified: Secondary | ICD-10-CM | POA: Diagnosis not present

## 2020-08-07 DIAGNOSIS — I5032 Chronic diastolic (congestive) heart failure: Secondary | ICD-10-CM | POA: Diagnosis not present

## 2020-08-07 DIAGNOSIS — Z952 Presence of prosthetic heart valve: Secondary | ICD-10-CM | POA: Diagnosis not present

## 2020-08-07 DIAGNOSIS — M1990 Unspecified osteoarthritis, unspecified site: Secondary | ICD-10-CM | POA: Diagnosis not present

## 2020-08-07 DIAGNOSIS — Z48812 Encounter for surgical aftercare following surgery on the circulatory system: Secondary | ICD-10-CM | POA: Diagnosis not present

## 2020-08-08 DIAGNOSIS — I5032 Chronic diastolic (congestive) heart failure: Secondary | ICD-10-CM | POA: Diagnosis not present

## 2020-08-08 DIAGNOSIS — G250 Essential tremor: Secondary | ICD-10-CM | POA: Diagnosis not present

## 2020-08-08 DIAGNOSIS — N312 Flaccid neuropathic bladder, not elsewhere classified: Secondary | ICD-10-CM | POA: Diagnosis not present

## 2020-08-08 DIAGNOSIS — I110 Hypertensive heart disease with heart failure: Secondary | ICD-10-CM | POA: Diagnosis not present

## 2020-08-08 DIAGNOSIS — M1990 Unspecified osteoarthritis, unspecified site: Secondary | ICD-10-CM | POA: Diagnosis not present

## 2020-08-08 DIAGNOSIS — I2510 Atherosclerotic heart disease of native coronary artery without angina pectoris: Secondary | ICD-10-CM | POA: Diagnosis not present

## 2020-08-08 DIAGNOSIS — Z48812 Encounter for surgical aftercare following surgery on the circulatory system: Secondary | ICD-10-CM | POA: Diagnosis not present

## 2020-08-08 DIAGNOSIS — Z951 Presence of aortocoronary bypass graft: Secondary | ICD-10-CM | POA: Diagnosis not present

## 2020-08-08 DIAGNOSIS — N139 Obstructive and reflux uropathy, unspecified: Secondary | ICD-10-CM | POA: Diagnosis not present

## 2020-08-08 DIAGNOSIS — Z952 Presence of prosthetic heart valve: Secondary | ICD-10-CM | POA: Diagnosis not present

## 2020-08-11 DIAGNOSIS — N1831 Chronic kidney disease, stage 3a: Secondary | ICD-10-CM | POA: Diagnosis not present

## 2020-08-11 DIAGNOSIS — I5042 Chronic combined systolic (congestive) and diastolic (congestive) heart failure: Secondary | ICD-10-CM | POA: Diagnosis not present

## 2020-08-11 DIAGNOSIS — R531 Weakness: Secondary | ICD-10-CM | POA: Diagnosis not present

## 2020-08-11 DIAGNOSIS — G479 Sleep disorder, unspecified: Secondary | ICD-10-CM | POA: Diagnosis not present

## 2020-08-11 DIAGNOSIS — R69 Illness, unspecified: Secondary | ICD-10-CM | POA: Diagnosis not present

## 2020-08-11 DIAGNOSIS — E440 Moderate protein-calorie malnutrition: Secondary | ICD-10-CM | POA: Diagnosis not present

## 2020-08-12 DIAGNOSIS — I110 Hypertensive heart disease with heart failure: Secondary | ICD-10-CM | POA: Diagnosis not present

## 2020-08-12 DIAGNOSIS — N139 Obstructive and reflux uropathy, unspecified: Secondary | ICD-10-CM | POA: Diagnosis not present

## 2020-08-12 DIAGNOSIS — I2510 Atherosclerotic heart disease of native coronary artery without angina pectoris: Secondary | ICD-10-CM | POA: Diagnosis not present

## 2020-08-12 DIAGNOSIS — N312 Flaccid neuropathic bladder, not elsewhere classified: Secondary | ICD-10-CM | POA: Diagnosis not present

## 2020-08-12 DIAGNOSIS — I5032 Chronic diastolic (congestive) heart failure: Secondary | ICD-10-CM | POA: Diagnosis not present

## 2020-08-12 DIAGNOSIS — G250 Essential tremor: Secondary | ICD-10-CM | POA: Diagnosis not present

## 2020-08-12 DIAGNOSIS — Z952 Presence of prosthetic heart valve: Secondary | ICD-10-CM | POA: Diagnosis not present

## 2020-08-12 DIAGNOSIS — M1990 Unspecified osteoarthritis, unspecified site: Secondary | ICD-10-CM | POA: Diagnosis not present

## 2020-08-12 DIAGNOSIS — Z951 Presence of aortocoronary bypass graft: Secondary | ICD-10-CM | POA: Diagnosis not present

## 2020-08-12 DIAGNOSIS — Z48812 Encounter for surgical aftercare following surgery on the circulatory system: Secondary | ICD-10-CM | POA: Diagnosis not present

## 2020-08-13 DIAGNOSIS — I2510 Atherosclerotic heart disease of native coronary artery without angina pectoris: Secondary | ICD-10-CM | POA: Diagnosis not present

## 2020-08-13 DIAGNOSIS — Z48812 Encounter for surgical aftercare following surgery on the circulatory system: Secondary | ICD-10-CM | POA: Diagnosis not present

## 2020-08-13 DIAGNOSIS — I110 Hypertensive heart disease with heart failure: Secondary | ICD-10-CM | POA: Diagnosis not present

## 2020-08-13 DIAGNOSIS — Z951 Presence of aortocoronary bypass graft: Secondary | ICD-10-CM | POA: Diagnosis not present

## 2020-08-13 DIAGNOSIS — I5032 Chronic diastolic (congestive) heart failure: Secondary | ICD-10-CM | POA: Diagnosis not present

## 2020-08-13 DIAGNOSIS — N312 Flaccid neuropathic bladder, not elsewhere classified: Secondary | ICD-10-CM | POA: Diagnosis not present

## 2020-08-13 DIAGNOSIS — M1990 Unspecified osteoarthritis, unspecified site: Secondary | ICD-10-CM | POA: Diagnosis not present

## 2020-08-13 DIAGNOSIS — N139 Obstructive and reflux uropathy, unspecified: Secondary | ICD-10-CM | POA: Diagnosis not present

## 2020-08-13 DIAGNOSIS — Z952 Presence of prosthetic heart valve: Secondary | ICD-10-CM | POA: Diagnosis not present

## 2020-08-13 DIAGNOSIS — G250 Essential tremor: Secondary | ICD-10-CM | POA: Diagnosis not present

## 2020-08-15 ENCOUNTER — Other Ambulatory Visit: Payer: Self-pay | Admitting: *Deleted

## 2020-08-15 DIAGNOSIS — Z952 Presence of prosthetic heart valve: Secondary | ICD-10-CM | POA: Diagnosis not present

## 2020-08-15 DIAGNOSIS — M1990 Unspecified osteoarthritis, unspecified site: Secondary | ICD-10-CM | POA: Diagnosis not present

## 2020-08-15 DIAGNOSIS — Z951 Presence of aortocoronary bypass graft: Secondary | ICD-10-CM | POA: Diagnosis not present

## 2020-08-15 DIAGNOSIS — N312 Flaccid neuropathic bladder, not elsewhere classified: Secondary | ICD-10-CM | POA: Diagnosis not present

## 2020-08-15 DIAGNOSIS — I5032 Chronic diastolic (congestive) heart failure: Secondary | ICD-10-CM | POA: Diagnosis not present

## 2020-08-15 DIAGNOSIS — G250 Essential tremor: Secondary | ICD-10-CM | POA: Diagnosis not present

## 2020-08-15 DIAGNOSIS — N139 Obstructive and reflux uropathy, unspecified: Secondary | ICD-10-CM | POA: Diagnosis not present

## 2020-08-15 DIAGNOSIS — I2510 Atherosclerotic heart disease of native coronary artery without angina pectoris: Secondary | ICD-10-CM | POA: Diagnosis not present

## 2020-08-15 DIAGNOSIS — Z48812 Encounter for surgical aftercare following surgery on the circulatory system: Secondary | ICD-10-CM | POA: Diagnosis not present

## 2020-08-15 DIAGNOSIS — I110 Hypertensive heart disease with heart failure: Secondary | ICD-10-CM | POA: Diagnosis not present

## 2020-08-15 NOTE — Patient Outreach (Signed)
[name]) Care Management  08/15/2020  [name] [name] 10-31-1927 [id number]   Weekly transition of care call placed to member/wife.  She report member was seen by PCP on yesterday, discussed lack of sleep and depression.  Member was placed on Zoloft and has Melatonin for sleep.  They will try this for a while and report tolerance to provider.  He has follow up with heart failure clinic on Monday, session with home health PT today.  She and member both still feel member is progressing slowly however she state others involved in his care (therapists, MD's, visiting nurses, etc) all are encouraged by his improvement.  She confirms his appetite has improved and she has enlisted the help of home aides to provide respite care for herself.  Denies any urgent concerns, encouraged to contact this care manager with questions.  Agrees to follow up within the next week.  Goals Addressed            This Visit's Progress   . [facility] - Improve My Heart Health-Coronary Artery Disease   On track    Timeframe:  Long-Range Goal Priority:  High Start Date:        07/30/2020                     Expected End Date:       09/27/2020                Follow Up Date 08/22/2020   - I can manage, know and watch for signs of a heart attack - if I have chest pain, call for help - learn my personal risk factors    Why is this important?    Lifestyle changes are key to improving the blood flow to your heart. Think about the things you can change and set a goal to live healthy.   Remember, when the blood vessels to your heart start to get clogged you may not have any symptoms.   Over time, they can get worse.   Don't ignore the signs, like chest pain, and get help right away.     Notes:   Encouraged to contact MD with any potential complications    . [facility] - Make and Keep All Appointments   On track    Timeframe:  Short-Term Goal Priority:  Medium Start Date:         07/30/2020                     Expected End Date:     08/27/2020                  Follow Up Date 2/25   - ask family or friend for a ride - call to cancel if needed - keep a calendar with prescription refill dates    Why is this important?    Part of staying healthy is seeing the doctor for follow-up care.   If you forget your appointments, there are some things you can do to stay on track.    Notes:   2/9 - Upcoming appointments reviewed with wife      [name], RN, MSN [place] Manager [phone]

## 2020-08-16 NOTE — Progress Notes (Signed)
Advanced Heart Failure Clinic Note   PCP: [name], MD PCP-Cardiologist: Peter Martinique, MD  HF Cardiologist: Dr. Haroldine Laws  HPI: 87 y/o male, very active despite age before recent illness. Had been working 20-30 hr/week for [employer] and [employer].    H/o combined systolic and diastolic HF w/ recent progressive decline in LVEF. Echo in 2014 showed normal LVEF 55-60% w/ G1DD. Echo 6/21 showed mildly reduced EF down to 40-45%. Echo repeated again 10/21 and EF had further dropped to 30-35% w/ global HK. RV systolic function mildly reduced. Also noted to have mod-sever AS. LVOT/AV VTI ratio: 0.25 and moderate MR. Had subsequent R/LHC showing severe 3V CAD. CI preserved at 2.74.    Underwent CABG x 4 (LIMA-LAD, sequential SVG-OM1 and OM2, SVG- PDA) + tissue AVR by Dr. Cyndia Bent on 11/26. Post operative course c/b atrial fibrillation treated w/ amiodarone + difficulty weaning inotropes, fluid overload and hypotension. AHF consult to assist with management. He was discharged to [facility] 06/26/20. His discharge weight was 168 lbs.  He had a follow up appointment with Dr. Martinique 1/21 and he was volume overloaded. Lasix was increased to 40 mg bid x 4 days then reduced to 40 mg daily.  Today he returns for HF follow up. Last office visit for post-hospital follow up he was thought to be in recurrent AF/AFL and planned for DCCV. Day of DCCV he was in [place]. Today, overall feeling fair. Very constipated and not sleeping well. Still struggling with fatigue and weakness. Wife says he walked into hospital forcardiac surgery and now he cannot be left alone around the house because he is so weak. Walks with walker. Denies increasing SOB, CP, dizziness, edema, or PND/Orthopnea. Appetite ok. No fever or chills. Drinking Ensure at home. Taking all medications. Doing PT until April. Wears 3.5 Liters of oxygen at night.  Cardiac Studies: Echo 05/20/20: LVEF has decreased from 12/10/2019, now 30-35% with LV  dyssynchrony, mod decreased function, RVSP 55.2 mmHg, severe LAE, severe AS with mean gradient 24.0 mmgHg, moderate MR, mild to moderate dilatation of the ascending aorta, measuring 44 mm.  L/RHC 05/20/20: Mid RCA lesion is 90% stenosed, Mid RCA to Dist RCA lesion is 100% stenosed, Prox LAD to Mid LAD lesion is 85% stenosed, 1st Mrg lesion is 95% stenosed, Prox Cx to Mid Cx lesion is 100% stenosed, LV EDP is mildly elevated. Hemodynamic findings consistent with mild pulmonary hypertension. 2nd Diag lesion is 100% stenosed.  1. Severe complex 3 vessel obstructive CAD.  2. Low flow Aortic stenosis. The valve is severely calcified. By cath mean gradient is 14 mm Hg.  3. Mildly elevated LV filling pressures 4. Mild pulmonary HTN with mean PA pressure 22 mmHg 5. Cardiac index 2.74  ROS: All systems reviewed and negative except as per HPI.   Past Medical History:  Diagnosis Date  . Aortic stenosis   . Arthritis   . CAD (coronary artery disease)   . Colitis   . Edema   . Essential hypertension    no med now  . Essential tremor   . Hyperlipidemia   . Hypotonic bladder    L4-L5  . Obstructive uropathy   . Peripheral neuropathy    Elevated by Dr. love 2010  . Prostate cancer [facility]) 2005    Current Outpatient Medications  Medication Sig Dispense Refill  . amiodarone (PACERONE) 200 MG tablet Take 1 tablet (200 mg total) by mouth daily. 90 tablet 3  . apixaban (ELIQUIS) 5 MG TABS tablet  Take 1 tablet (5 mg total) by mouth 2 (two) times daily. 180 tablet 1  . Ascorbic Acid (VITAMIN C) 100 MG tablet Take 100 mg by mouth daily.    Marland Kitchen aspirin 81 MG tablet Take 81 mg by mouth at bedtime.     . docusate sodium (COLACE) 100 MG capsule Take 1 capsule (100 mg total) by mouth 2 (two) times daily. 10 capsule 0  . dutasteride (AVODART) 0.5 MG capsule Take 0.5 mg by mouth daily.    . feeding supplement (ENSURE ENLIVE / ENSURE PLUS) LIQD Take 237 mLs by mouth 4 (four) times daily. 237 mL 12  . ferrous  [id number] C-folic acid (TRINSICON / FOLTRIN) capsule Take 1 capsule by mouth 2 (two) times daily after a meal.    . furosemide (LASIX) 40 MG tablet Take 1 tablet (40 mg total) by mouth daily. 30 tablet 11  . melatonin 5 MG TABS Take 1 tablet (5 mg total) by mouth at bedtime as needed (sleep).  0  . Multiple Vitamins-Minerals (CENTRUM SILVER PO) Take 1 tablet by mouth daily.     . ondansetron (ZOFRAN) 4 MG tablet Take 1 tablet (4 mg total) by mouth every 8 (eight) hours as needed for nausea or vomiting. 20 tablet 0  . tamsulosin (FLOMAX) 0.4 MG CAPS Take 0.4 mg by mouth 2 (two) times daily.      No current facility-administered medications for this encounter.   Allergies  Allergen Reactions  . Simvastatin Other (See Comments)    "caused neuropathy pain"    Social History   Socioeconomic History  . Marital status: Married    Spouse name: Not on file  . Number of children: Not on file  . Years of education: Not on file  . Highest education level: Not on file  Occupational History  . Not on file  Tobacco Use  . Smoking status: Never Smoker  . Smokeless tobacco: Never Used  Vaping Use  . Vaping Use: Never used  Substance and Sexual Activity  . Alcohol use: Yes    Alcohol/week: 1.0 standard drink    Types: 1 Glasses of wine per week    Comment: occ  . Drug use: No  . Sexual activity: Yes  Other Topics Concern  . Not on file  Social History Narrative  . Not on file   Social Determinants of Health   Financial Resource Strain: Not on file  Food Insecurity: No Food Insecurity  . Worried About Charity fundraiser in the Last Year: Never true  . Ran Out of Food in the Last Year: Never true  Transportation Needs: No Transportation Needs  . Lack of Transportation (Medical): No  . Lack of Transportation (Non-Medical): No  Physical Activity: Not on file  Stress: Not on file  Social Connections: Not on file  Intimate Partner Violence: Not on file   Family History   Problem Relation Age of Onset  . Hypertension Mother   . Heart failure Mother   . Diabetes Mother   . Heart attack Brother   . Heart attack Maternal Aunt    Vitals:   08/18/20 1506  BP: 120/80  Pulse: 70  SpO2: 93%  Weight: 78.8 kg (173 lb 12.8 oz)    Wt Readings from Last 3 Encounters:  08/18/20 78.8 kg (173 lb 12.8 oz)  07/30/20 73.9 kg (163 lb)  07/28/20 73.5 kg (162 lb)   PHYSICAL EXAM: General:  NAD. No resp difficulty, frail/elderly. HEENT: Normal, HOH Neck: Supple.  No JVD. Carotids 2+ bilat; no bruits. No lymphadenopathy or thryomegaly appreciated. Cor: PMI nondisplaced. Regular rate & rhythm. No rubs, gallops, murmurs Lungs: Clear, diminished in bases Abdomen: Soft, nontender, nondistended. No hepatosplenomegaly. No bruits or masses. Good bowel sounds. Extremities: No cyanosis, clubbing, rash, trace pedal edema, R>L Neuro: alert & oriented x 3, cranial nerves grossly intact. Moves all 4 extremities w/o difficulty. Affect pleasant.  ECG: SR 73 bpm w/ 1st degree AVB, pr 342 ms, LBBB (personally reviewed).  ASSESSMENT & PLAN: 1. Severe 3V CAD: - s/p CABGx 4 (LIMA-LAD, sequential SVG-OM1 and OM2, SVG- PDA) - No s/s ischemia. - Continue ASA 81 mg daily. - No ? blocker w/ post-cardiotomy shock and low BP. - Atorva started 12/7 and stopped 12/8. Resume when deconditioning resolves.   2. Aortic Stenosis, s/p tissue AVR (05/23/20) - Severe low flow low gradient AS. - AV prosthesis ok on post op echo. - Stable.   3.Chronic Biventricular Heart Failure:  - Echo in 2014 showed normal LVEF 55-60% w/ G1DD.  - Echo 10/21 and EF 30-35% w/ global HK. RV systolic function mildly reduced. - LHC this admit w/ severe 3V CAD>>ICM. Now s/p CABG. Also w/ severe AS, possibly contributing to LV dysfunction, now s/p SAVR.  - Post-op echo 12/1 EF 30-35%, RV mildly reduced. AV prothesis ok. - Portable echo (1/22): EF 30-35% (personally reviewed by Dr. Haroldine Laws). - NYHA IIIb, confounded  by deconditioning. Volume status good today. - Stop midodrine. - Continue lasix 40 mg daily. - Consider adding Entresto/ARB & Farxiga soon. - Off digoxin with heart block.  - No beta blocker with AV block. - BMET today.    4. Atrial Fibrillation, post op.   - SR with long 1st AVB (342 ms) - Portable echo (1/22) EF 30-35%. - Continue amio 200 mg daily. - Continue Eliquis 5 mg bid.  - No bleeding issues. - With worsening fatigue despite optimization of HF medications & PT, will Refer to EP with worsening AV block.  5. CKD IIIa - Baseline <1.0.  - BMET today.   6. Anemia - Expected ABLA from surgery.  - on po Iron. - CBC today.   7. Physical Deconditioning - Major issue currently.  - Encouraged Ensure 2-3x/day. - Continue with PT.   8. Urinary Retention - Wears foley, changed every month.   9. Insomnia - Sleep hygiene discussed with patient. - Instructed patient to take Zoloft at bedtime to see if this helps. - Will need to follow up with PCP.  10. Anxiety/Depression - Started on sertraline per PCP. - Encouraged close follow up with PCP for med titration.  Follow up in 4-6 weeks.  [place], Elma, FNP-BC 08/18/20

## 2020-08-18 ENCOUNTER — Encounter (HOSPITAL_COMMUNITY): Payer: Self-pay

## 2020-08-18 ENCOUNTER — Other Ambulatory Visit: Payer: Self-pay

## 2020-08-18 ENCOUNTER — Ambulatory Visit (HOSPITAL_COMMUNITY)
Admission: RE | Admit: 2020-08-18 | Discharge: 2020-08-18 | Disposition: A | Discharge status: Home / Self Care | Payer: Medicare HMO | Source: Ambulatory Visit | Attending: Family Medicine | Admitting: Family Medicine

## 2020-08-18 VITALS — BP 120/80 | HR 70 | Wt 173.8 lb

## 2020-08-18 DIAGNOSIS — F32A Depression, unspecified: Secondary | ICD-10-CM

## 2020-08-18 DIAGNOSIS — F419 Anxiety disorder, unspecified: Secondary | ICD-10-CM | POA: Insufficient documentation

## 2020-08-18 DIAGNOSIS — R531 Weakness: Secondary | ICD-10-CM | POA: Insufficient documentation

## 2020-08-18 DIAGNOSIS — I272 Pulmonary hypertension, unspecified: Secondary | ICD-10-CM | POA: Insufficient documentation

## 2020-08-18 DIAGNOSIS — I25810 Atherosclerosis of coronary artery bypass graft(s) without angina pectoris: Secondary | ICD-10-CM | POA: Diagnosis not present

## 2020-08-18 DIAGNOSIS — K5900 Constipation, unspecified: Secondary | ICD-10-CM | POA: Diagnosis not present

## 2020-08-18 DIAGNOSIS — Z7982 Long term (current) use of aspirin: Secondary | ICD-10-CM | POA: Insufficient documentation

## 2020-08-18 DIAGNOSIS — I4891 Unspecified atrial fibrillation: Secondary | ICD-10-CM | POA: Insufficient documentation

## 2020-08-18 DIAGNOSIS — I350 Nonrheumatic aortic (valve) stenosis: Secondary | ICD-10-CM | POA: Insufficient documentation

## 2020-08-18 DIAGNOSIS — N1831 Chronic kidney disease, stage 3a: Secondary | ICD-10-CM

## 2020-08-18 DIAGNOSIS — G4700 Insomnia, unspecified: Secondary | ICD-10-CM | POA: Diagnosis not present

## 2020-08-18 DIAGNOSIS — I5082 Biventricular heart failure: Secondary | ICD-10-CM | POA: Insufficient documentation

## 2020-08-18 DIAGNOSIS — R339 Retention of urine, unspecified: Secondary | ICD-10-CM | POA: Insufficient documentation

## 2020-08-18 DIAGNOSIS — Z8546 Personal history of malignant neoplasm of prostate: Secondary | ICD-10-CM | POA: Diagnosis not present

## 2020-08-18 DIAGNOSIS — R5381 Other malaise: Secondary | ICD-10-CM

## 2020-08-18 DIAGNOSIS — Z7901 Long term (current) use of anticoagulants: Secondary | ICD-10-CM | POA: Insufficient documentation

## 2020-08-18 DIAGNOSIS — Z951 Presence of aortocoronary bypass graft: Secondary | ICD-10-CM | POA: Insufficient documentation

## 2020-08-18 DIAGNOSIS — Z952 Presence of prosthetic heart valve: Secondary | ICD-10-CM | POA: Diagnosis not present

## 2020-08-18 DIAGNOSIS — Z79899 Other long term (current) drug therapy: Secondary | ICD-10-CM | POA: Insufficient documentation

## 2020-08-18 DIAGNOSIS — I480 Paroxysmal atrial fibrillation: Secondary | ICD-10-CM | POA: Diagnosis not present

## 2020-08-18 DIAGNOSIS — I130 Hypertensive heart and chronic kidney disease with heart failure and stage 1 through stage 4 chronic kidney disease, or unspecified chronic kidney disease: Secondary | ICD-10-CM | POA: Diagnosis not present

## 2020-08-18 DIAGNOSIS — D649 Anemia, unspecified: Secondary | ICD-10-CM | POA: Diagnosis not present

## 2020-08-18 DIAGNOSIS — I5042 Chronic combined systolic (congestive) and diastolic (congestive) heart failure: Secondary | ICD-10-CM | POA: Diagnosis not present

## 2020-08-18 DIAGNOSIS — I2510 Atherosclerotic heart disease of native coronary artery without angina pectoris: Secondary | ICD-10-CM | POA: Diagnosis not present

## 2020-08-18 LAB — BASIC METABOLIC PANEL
Anion gap: 9 (ref 5–15)
BUN: 23 mg/dL (ref 8–23)
CO2: 27 mmol/L (ref 22–32)
Calcium: 8.9 mg/dL (ref 8.9–10.3)
Chloride: 97 mmol/L — ABNORMAL LOW (ref 98–111)
Creatinine, Ser: 1.2 mg/dL (ref 0.61–1.24)
GFR, Estimated: 57 mL/min — ABNORMAL LOW (ref >60)
Glucose, Bld: 82 mg/dL (ref 70–99)
Potassium: 4.1 mmol/L (ref 3.5–5.1)
Sodium: 133 mmol/L — ABNORMAL LOW (ref 135–145)

## 2020-08-18 LAB — CBC
HCT: 33 % — ABNORMAL LOW (ref 39.0–52.0)
Hemoglobin: 11.1 g/dL — ABNORMAL LOW (ref 13.0–17.0)
MCH: 28.4 pg (ref 26.0–34.0)
MCHC: 33.6 g/dL (ref 30.0–36.0)
MCV: 84.4 fL (ref 80.0–100.0)
Platelets: 181 10*3/uL (ref 150–400)
RBC: 3.91 MIL/uL — ABNORMAL LOW (ref 4.22–5.81)
RDW: 15.1 % (ref 11.5–15.5)
WBC: 6.1 10*3/uL (ref 4.0–10.5)
nRBC: 0 % (ref 0.0–0.2)

## 2020-08-18 NOTE — Patient Instructions (Signed)
STOP Midodrine  Labs today We will only contact you if something comes back abnormal or we need to make some changes. Otherwise no news is good news!   Your physician recommends that you schedule a follow-up appointment in: 4 weeks  in the [facility] (PA/NP) [facility], you and your health needs are our priority. As part of our continuing mission to provide you with exceptional heart care, we have created designated Provider Care Teams. These Care Teams include your primary Cardiologist (physician) and Advanced Practice Providers (APPs- Physician Assistants and Nurse Practitioners) who all work together to provide you with the care you need, when you need it.   You may see any of the following providers on your designated Care Team at your next follow up: Marland Kitchen Dr Glori Bickers . Dr Loralie Champagne . Dr Vickki Muff . Darrick Grinder, NP . Lyda Jester, [place] . Audry Riles, PharmD   Please be sure to bring in all your medications bottles to every appointment.    Do the following things EVERYDAY: 1) Weigh yourself in the morning before breakfast. Write it down and keep it in a log. 2) Take your medicines as prescribed 3) Eat low salt foods--Limit salt (sodium) to 2000 mg per day.  4) Stay as active as you can everyday 5) Limit all fluids for the day to less than 2 liters

## 2020-08-19 DIAGNOSIS — M1990 Unspecified osteoarthritis, unspecified site: Secondary | ICD-10-CM | POA: Diagnosis not present

## 2020-08-19 DIAGNOSIS — N312 Flaccid neuropathic bladder, not elsewhere classified: Secondary | ICD-10-CM | POA: Diagnosis not present

## 2020-08-19 DIAGNOSIS — I2510 Atherosclerotic heart disease of native coronary artery without angina pectoris: Secondary | ICD-10-CM | POA: Diagnosis not present

## 2020-08-19 DIAGNOSIS — N139 Obstructive and reflux uropathy, unspecified: Secondary | ICD-10-CM | POA: Diagnosis not present

## 2020-08-19 DIAGNOSIS — G250 Essential tremor: Secondary | ICD-10-CM | POA: Diagnosis not present

## 2020-08-19 DIAGNOSIS — I110 Hypertensive heart disease with heart failure: Secondary | ICD-10-CM | POA: Diagnosis not present

## 2020-08-19 DIAGNOSIS — Z48812 Encounter for surgical aftercare following surgery on the circulatory system: Secondary | ICD-10-CM | POA: Diagnosis not present

## 2020-08-19 DIAGNOSIS — I5032 Chronic diastolic (congestive) heart failure: Secondary | ICD-10-CM | POA: Diagnosis not present

## 2020-08-19 DIAGNOSIS — Z952 Presence of prosthetic heart valve: Secondary | ICD-10-CM | POA: Diagnosis not present

## 2020-08-19 DIAGNOSIS — Z951 Presence of aortocoronary bypass graft: Secondary | ICD-10-CM | POA: Diagnosis not present

## 2020-08-20 DIAGNOSIS — G250 Essential tremor: Secondary | ICD-10-CM | POA: Diagnosis not present

## 2020-08-20 DIAGNOSIS — Z48812 Encounter for surgical aftercare following surgery on the circulatory system: Secondary | ICD-10-CM | POA: Diagnosis not present

## 2020-08-20 DIAGNOSIS — I110 Hypertensive heart disease with heart failure: Secondary | ICD-10-CM | POA: Diagnosis not present

## 2020-08-20 DIAGNOSIS — M1990 Unspecified osteoarthritis, unspecified site: Secondary | ICD-10-CM | POA: Diagnosis not present

## 2020-08-20 DIAGNOSIS — N312 Flaccid neuropathic bladder, not elsewhere classified: Secondary | ICD-10-CM | POA: Diagnosis not present

## 2020-08-20 DIAGNOSIS — I2510 Atherosclerotic heart disease of native coronary artery without angina pectoris: Secondary | ICD-10-CM | POA: Diagnosis not present

## 2020-08-20 DIAGNOSIS — Z952 Presence of prosthetic heart valve: Secondary | ICD-10-CM | POA: Diagnosis not present

## 2020-08-20 DIAGNOSIS — Z951 Presence of aortocoronary bypass graft: Secondary | ICD-10-CM | POA: Diagnosis not present

## 2020-08-20 DIAGNOSIS — I5032 Chronic diastolic (congestive) heart failure: Secondary | ICD-10-CM | POA: Diagnosis not present

## 2020-08-20 DIAGNOSIS — N139 Obstructive and reflux uropathy, unspecified: Secondary | ICD-10-CM | POA: Diagnosis not present

## 2020-08-21 ENCOUNTER — Telehealth (HOSPITAL_COMMUNITY): Payer: Self-pay | Admitting: Cardiology

## 2020-08-21 DIAGNOSIS — Z951 Presence of aortocoronary bypass graft: Secondary | ICD-10-CM | POA: Diagnosis not present

## 2020-08-21 DIAGNOSIS — I2510 Atherosclerotic heart disease of native coronary artery without angina pectoris: Secondary | ICD-10-CM | POA: Diagnosis not present

## 2020-08-21 DIAGNOSIS — G250 Essential tremor: Secondary | ICD-10-CM | POA: Diagnosis not present

## 2020-08-21 DIAGNOSIS — M1990 Unspecified osteoarthritis, unspecified site: Secondary | ICD-10-CM | POA: Diagnosis not present

## 2020-08-21 DIAGNOSIS — I110 Hypertensive heart disease with heart failure: Secondary | ICD-10-CM | POA: Diagnosis not present

## 2020-08-21 DIAGNOSIS — Z48812 Encounter for surgical aftercare following surgery on the circulatory system: Secondary | ICD-10-CM | POA: Diagnosis not present

## 2020-08-21 DIAGNOSIS — N312 Flaccid neuropathic bladder, not elsewhere classified: Secondary | ICD-10-CM | POA: Diagnosis not present

## 2020-08-21 DIAGNOSIS — Z952 Presence of prosthetic heart valve: Secondary | ICD-10-CM | POA: Diagnosis not present

## 2020-08-21 DIAGNOSIS — I5032 Chronic diastolic (congestive) heart failure: Secondary | ICD-10-CM | POA: Diagnosis not present

## 2020-08-21 DIAGNOSIS — I5042 Chronic combined systolic (congestive) and diastolic (congestive) heart failure: Secondary | ICD-10-CM

## 2020-08-21 DIAGNOSIS — N139 Obstructive and reflux uropathy, unspecified: Secondary | ICD-10-CM | POA: Diagnosis not present

## 2020-08-21 NOTE — Telephone Encounter (Signed)
Referral placed.

## 2020-08-21 NOTE — Telephone Encounter (Signed)
-----   Message from Rafael Bihari, [place] sent at 08/18/2020  5:03 PM EST ----- Blood count and kidney function look ok, but with long PR interval on ECG, needs referral to EP.

## 2020-08-22 ENCOUNTER — Other Ambulatory Visit: Payer: Self-pay | Admitting: *Deleted

## 2020-08-22 DIAGNOSIS — M1990 Unspecified osteoarthritis, unspecified site: Secondary | ICD-10-CM | POA: Diagnosis not present

## 2020-08-22 DIAGNOSIS — Z951 Presence of aortocoronary bypass graft: Secondary | ICD-10-CM | POA: Diagnosis not present

## 2020-08-22 DIAGNOSIS — N183 Chronic kidney disease, stage 3 unspecified: Secondary | ICD-10-CM | POA: Diagnosis not present

## 2020-08-22 DIAGNOSIS — Z952 Presence of prosthetic heart valve: Secondary | ICD-10-CM | POA: Diagnosis not present

## 2020-08-22 DIAGNOSIS — I5032 Chronic diastolic (congestive) heart failure: Secondary | ICD-10-CM | POA: Diagnosis not present

## 2020-08-22 DIAGNOSIS — I5022 Chronic systolic (congestive) heart failure: Secondary | ICD-10-CM | POA: Diagnosis not present

## 2020-08-22 DIAGNOSIS — I2510 Atherosclerotic heart disease of native coronary artery without angina pectoris: Secondary | ICD-10-CM | POA: Diagnosis not present

## 2020-08-22 DIAGNOSIS — N312 Flaccid neuropathic bladder, not elsewhere classified: Secondary | ICD-10-CM | POA: Diagnosis not present

## 2020-08-22 DIAGNOSIS — G250 Essential tremor: Secondary | ICD-10-CM | POA: Diagnosis not present

## 2020-08-22 DIAGNOSIS — N139 Obstructive and reflux uropathy, unspecified: Secondary | ICD-10-CM | POA: Diagnosis not present

## 2020-08-22 DIAGNOSIS — I110 Hypertensive heart disease with heart failure: Secondary | ICD-10-CM | POA: Diagnosis not present

## 2020-08-22 DIAGNOSIS — I4891 Unspecified atrial fibrillation: Secondary | ICD-10-CM | POA: Diagnosis not present

## 2020-08-22 DIAGNOSIS — Z48812 Encounter for surgical aftercare following surgery on the circulatory system: Secondary | ICD-10-CM | POA: Diagnosis not present

## 2020-08-22 NOTE — Patient Outreach (Signed)
[place] [facility]) Care Management  08/22/2020  [name] [name] April 07, 1928 [id number]   Weekly transition of care call placed to member's wife.  She report he has shown some signs of improvement, able to get in/out of bathroom independently now.  Continue to have concerns for constipation and sleep, this was discussed with provider, advised to give fleet enema.  Follow up appointment with heart failure clinic completed on 2/21, will return on 3/21.  Wife state member continues to have intermittent spells of fatigue, usually after waking from a nap.  State this takes his energy away for a while, sometimes the rest of the day.  No other symptoms, will discuss with cardiology on 3/3.  Does not relate this to days where he has PT/OT sessions.  He will have another PT session today, wife will also discuss future referral for cardiac rehab with cardiology during visit.  Denies any urgent concerns, encouraged to contact this care manager with questions.  Agrees to follow up within the next week.  Goals Addressed            This Visit's Progress   . [facility] - Improve My Heart Health-Coronary Artery Disease   On track    Timeframe:  Long-Range Goal Priority:  High Start Date:        07/30/2020                     Expected End Date:       09/27/2020                Follow Up Date 08/29/2020   - be open to making changes - learn about small changes that will make a big difference    Why is this important?    Lifestyle changes are key to improving the blood flow to your heart. Think about the things you can change and set a goal to live healthy.   Remember, when the blood vessels to your heart start to get clogged you may not have any symptoms.   Over time, they can get worse.   Don't ignore the signs, like chest pain, and get help right away.     Notes:   Encouraged to contact MD with any potential complications    . [facility] - Make and Keep All Appointments   On track    Timeframe:  Short-Term  Goal Priority:  Medium Start Date:         07/30/2020                    Expected End Date:     08/27/2020                  Follow Up Date 3/4   - ask family or friend for a ride - call to cancel if needed - keep a calendar with prescription refill dates    Why is this important?    Part of staying healthy is seeing the doctor for follow-up care.   If you forget your appointments, there are some things you can do to stay on track.    Notes:   2/9 - Upcoming appointments reviewed with wife  2/25 - HF clinic visit reviewed with wife, reminded of upcoming appointments      [name], RN, MSN [place] Manager [phone]

## 2020-08-25 NOTE — Progress Notes (Signed)
Cardiology Office Note   Date:  08/25/2020   ID:  [name] [name], DOB [birth year], MRN [id number]  PCP:  Catha Gosselin, MD  Cardiologist:   Anikin Prosser [place], MD   No chief complaint on file.     History of Present Illness: [name] [name] is a 87 y.o. male who is seen for follow up CHF, progressive AS. He has a history of venous insufficiency with chronic lower extremity edema (mostly on the left), hypertension, hyperlipidemia, peripheral neuropathy, essential tremor, and prostate cancer.  Patient first seen  in 09/2012 for evaluation of dyspnea on exertion and lower extremity edema, especially in his left leg. Venous dopplers were ordered and showed no DVT. Echo showed LVEF of 55-60% with moderate asymmetric basal septal hypertrophy with no LV outflow tract gradient and mild to moderate MR. He was seen  in 07/2017 at which time he was doing well. He denied any chest pain or shortness of breath. He continued to report some lower extremity edema that resolved with elevation of legs at night. Edema felt to be due to venous insufficiency and conservative management with sodium restriction, elevation of legs, and compression stockings was recommended.   He was diagnosed with right middle lobe pneumonia on 10/12/2019 after presenting for further evaluation of fatigue while on vacation at the beach.. He tested negative for COVID. He was started on Levaquin but this had to be stopped when he developed tendonitis. He was started on Azithromycin instead on 10/26/2019. Repeat chest x-ray on 10/26/2019 showed increased density of right lower lobe pneumonia. He had persistent fatigue  and developed significant lower extremity edema and weight gain. Patient was seen by PCP again on 11/14/2019. Repeat chest x-ray showed continued right basilar pneumonia and increased right pleural effusion. BNP was checked and came back elevated at 1,380. He was started on Lasix 80mg  daily. Chest CT was also ordered to rule out underlying  lung mass. CT was performed on5/20/2021andshowed mild cardiomegaly, moderate right pleural effusion with adjacent atelectasis or pneumonia in the right lower and middle lobes as well as coronary artery calcifications.Of note, prior to being started on Lasix, he was seen by Urology for urinary retention and had a catheter placed.  He was taking lasix 80 mg daily but this has been reduced to 40 mg. Foley catheter was removed and he is now in and out cathing himself every other day.  He had an Echo done in June showing EF 40-45% with severe HK of the entire inferior wall. There was severe basal septal hypertrophy with gr 2 diastolic dysfunction. Moderate MR, mild AS and mild Aortic root enlargement. We started him on losartan 25 mg daily but dose had to be reduced to 12.5 mg due to hypotension. He was seen in September with worsening CHF with weight gain, edema and worsening fatigue. Lasix dose was increased. Echo showed further decline in LV function with EF 30-35%. AS was felt to be moderate to severe ( low flow AS).   He subsequently underwent cardiac cath. This demonstrated severe 3 vessel CAD with complex anatomy as well as low flow severe AS.  He was seen in consultation with Dr Laneta Simmers and underwent combined AVR with a #29 Edwards Inspiris Resilia valve and CABG with LIMA to the LAD, sequential SVG to OM1 and OM2, and SVG to the PDA. His post op course was complicated by persistent low output CHF and hypotension. No beta blocker due to hypotension. No digoxin due to some AV block. Was followed inpatient  by the Heart failure service. Was on Dobutamine until Dec 18 then weaned off. He had some post op Afib but converted to NSR. He was placed on midodrine for hypotension. He had significant debility and was evaluated by OT an PT. He was treated for PNA. Repeat Echo showed EF 35% with good AVR prosthesis function. He was DC to SNF on 06/26/20.   Received call from facility RN yesterday noting increased  SOB, weight gain, and O2 requirement. Given lasix 20 mg daily with some improvement. CXR reportedly showed pleural effusions with possible PNA and he was started on doxycycline. Lasix increased to 40 mg daily  Followed in Advanced heart failure clinic. Was felt to be in Afib and DCCV arranged but when he presented to the hospital he was in NSR. Last seen in HF clinic on 08/18/20.  Patient is seen with his wife. BP is improved. He has been off midodrine for 2-3 weeks. He still complains of marked fatigue. Feels horrible. Has a sense of unease. Weight has increased. Some more swelling. Complains of lack of sleep. On sertraline for past 1.5 weeks. Prescribed Lunesta but hasn't started yet. Has constipation. Has improved with PT/OT and is functioning a little more independently. Appetite remains poor.    Past Medical History:  Diagnosis Date  . Aortic stenosis   . Arthritis   . CAD (coronary artery disease)   . Colitis   . Edema   . Essential hypertension    no med now  . Essential tremor   . Hyperlipidemia   . Hypotonic bladder    L4-L5  . Obstructive uropathy   . Peripheral neuropathy    Elevated by Dr. love 2010  . Prostate cancer (HCC) 2005    Past Surgical History:  Procedure Laterality Date  . ACHILLES TENDON SURGERY Bilateral 1995  . AORTIC VALVE REPLACEMENT N/A 05/23/2020   Procedure: AORTIC VALVE REPLACEMENT (AVR) USING EDWARDS RESILIA 29 MM AORTIC VALVE.;  Surgeon: Alleen Borne, MD;  Location: [facility] OR;  Service: Open Heart Surgery;  Laterality: N/A;  . COLONOSCOPY     With polyp resection  . CORONARY ARTERY BYPASS GRAFT N/A 05/23/2020   Procedure: CORONARY ARTERY BYPASS GRAFTING (CABG) USING LIMA to LAD; ENDOSCOPIC HARVESTED RIGHT GREATER SAPHENOUS VEIN: SVG to PD; SVG to SEQUENCED INTERMEDIATE to OM;  Surgeon: Alleen Borne, MD;  Location: [facility] OR;  Service: Open Heart Surgery;  Laterality: N/A;  . ENDOVEIN HARVEST OF GREATER SAPHENOUS VEIN Right 05/23/2020   Procedure:  ENDOVEIN HARVEST OF GREATER SAPHENOUS VEIN;  Surgeon: Alleen Borne, MD;  Location: [facility] OR;  Service: Open Heart Surgery;  Laterality: Right;  . REVERSE SHOULDER ARTHROPLASTY Right 06/04/2016  . REVERSE SHOULDER ARTHROPLASTY Right 06/04/2016   Procedure: REVERSE SHOULDER ARTHROPLASTY;  Surgeon: Beverely Low, MD;  Location: [facility] OR;  Service: Orthopedics;  Laterality: Right;  . RIGHT/LEFT HEART CATH AND CORONARY ANGIOGRAPHY N/A 05/20/2020   Procedure: RIGHT/LEFT HEART CATH AND CORONARY ANGIOGRAPHY;  Surgeon: [place], Bijon Mineer M, MD;  Location: [facility] INVASIVE CV LAB;  Service: Cardiovascular;  Laterality: N/A;  . SKIN CANCER EXCISION  1974   On chest wall  . TEE WITHOUT CARDIOVERSION N/A 05/23/2020   Procedure: TRANSESOPHAGEAL ECHOCARDIOGRAM (TEE);  Surgeon: Alleen Borne, MD;  Location: [facility] OR;  Service: Open Heart Surgery;  Laterality: N/A;  . TOTAL KNEE ARTHROPLASTY Right 2011  . TOTAL KNEE ARTHROPLASTY Left 04/09/2013   Procedure: LEFT TOTAL KNEE ARTHROPLASTY;  Surgeon: Loanne Drilling, MD;  Location: [facility] ORS;  Service: Orthopedics;  Laterality: Left;     Current Outpatient Medications  Medication Sig Dispense Refill  . amiodarone (PACERONE) 200 MG tablet Take 1 tablet (200 mg total) by mouth daily. 90 tablet 3  . apixaban (ELIQUIS) 5 MG TABS tablet Take 1 tablet (5 mg total) by mouth 2 (two) times daily. 180 tablet 1  . Ascorbic Acid (VITAMIN C) 100 MG tablet Take 100 mg by mouth daily.    Marland Kitchen aspirin 81 MG tablet Take 81 mg by mouth at bedtime.     . docusate sodium (COLACE) 100 MG capsule Take 1 capsule (100 mg total) by mouth 2 (two) times daily. 10 capsule 0  . dutasteride (AVODART) 0.5 MG capsule Take 0.5 mg by mouth daily.    . feeding supplement (ENSURE ENLIVE / ENSURE PLUS) LIQD Take 237 mLs by mouth 4 (four) times daily. 237 mL 12  . ferrous fumarate-b12-vitamic C-folic acid (TRINSICON / FOLTRIN) capsule Take 1 capsule by mouth 2 (two) times daily after a meal.    . furosemide (LASIX) 40  MG tablet Take 1 tablet (40 mg total) by mouth daily. 30 tablet 11  . melatonin 5 MG TABS Take 1 tablet (5 mg total) by mouth at bedtime as needed (sleep).  0  . Multiple Vitamins-Minerals (CENTRUM SILVER PO) Take 1 tablet by mouth daily.     . ondansetron (ZOFRAN) 4 MG tablet Take 1 tablet (4 mg total) by mouth every 8 (eight) hours as needed for nausea or vomiting. 20 tablet 0  . tamsulosin (FLOMAX) 0.4 MG CAPS Take 0.4 mg by mouth 2 (two) times daily.      No current facility-administered medications for this visit.    Allergies:   Simvastatin    Social History:  The patient  reports that he has never smoked. He has never used smokeless tobacco. He reports current alcohol use of about 1.0 standard drink of alcohol per week. He reports that he does not use drugs.   Family History:  The patient's family history includes Diabetes in his mother; Heart attack in his brother and maternal aunt; Heart failure in his mother; Hypertension in his mother.    ROS:  Please see the history of present illness.   Otherwise, review of systems are positive for none.   All other systems are reviewed and negative.    PHYSICAL EXAM: VS:  There were no vitals taken for this visit. , BMI There is no height or weight on file to calculate BMI. GEN: elderly WM, well developed, in no acute distress  HEENT: normal  Neck: + JVD to 8 cm, no carotid bruits, or masses Cardiac: RRR; gr 1/6 systolic murmur at the RUSB. No  rubs, or gallops.  Respiratory:  Bilateral reduced BS with dullness to percussion.  GI  soft, nontender, nondistended, + BS MS: no deformity or atrophy  Skin: warm and dry, no rash Ext: 2+ LE edema pretibial Neuro:  Strength and sensation are intact Psych: euthymic mood, full affect   EKG:  EKG is not ordered today. The ekg ordered today demonstrates N/A   Recent Labs: 01/02/2020: Pro B Natriuretic peptide (BNP) 1,167.0 06/02/2020: ALT 20 06/13/2020: TSH 1.920 06/23/2020: Magnesium  2.2 07/24/2020: BNP 1,251.5 08/18/2020: BUN 23; Creatinine, Ser 1.20; Hemoglobin 11.1; Platelets 181; Potassium 4.1; Sodium 133    Lipid Panel No results found for: CHOL, TRIG, HDL, CHOLHDL, VLDL, LDLCALC, LDLDIRECT    Wt Readings from Last 3 Encounters:  08/18/20 173 lb 12.8 oz (78.8 kg)  07/30/20 163 lb (73.9 kg)  07/28/20 162 lb (73.5 kg)    - 10/12/2019: WBC 7.6, Hgb 14.2, Plts 246. Na 140, K 4.4, Glucose 103, BUN 12, Cr 1.26. AST 29, ALT 21, Alk Phos 61, Total Bili 0.5. TSH 0.849. UA showed 30mg  of protein but was otherwise unremarkable.  - 10/26/2019: Cr 1.20.  - 11/14/2019: BNP 1,380. Na 140, K 4.6, Glucose 107, BUN 22, Cr 1.16. AST 26, ALT, Alk Phos 59. Dated 12/06/19: BUN 27, creatinine 1.4. CBC and CMET normal.  Labs dated1/20/22: BUN 18, creatinine 1.2. Lytes OK. Hgb 11.1. normal WBC. BNP 2010.   CXR report 07/15/20: moderate bilateral pleural effusions.   Other studies Reviewed: Additional studies/ records that were reviewed today include:  Echo 12/10/19: IMPRESSIONS    1. Left ventricular ejection fraction, by estimation, is 40 to 45%. The  left ventricle has mildly decreased function. The left ventricle  demonstrates global hypokinesis. There is severe left ventricular  hypertrophy of the basal-septal segment. Left  ventricular diastolic parameters are consistent with Grade II diastolic  dysfunction (pseudonormalization). There is severe hypokinesis of the left  ventricular, entire inferior wall and inferoseptal wall.  2. Right ventricular systolic function is normal. The right ventricular  size is mildly enlarged. There is normal pulmonary artery systolic  pressure. The estimated right ventricular systolic pressure is 31.2 mmHg.  3. Left atrial size was mild to moderately dilated.  4. The mitral valve is normal in structure. Moderate mitral valve  regurgitation. No evidence of mitral stenosis.  5. The aortic valve is tricuspid. Aortic valve regurgitation is not   visualized. Mild aortic valve stenosis. Aortic valve area, by VTI measures  1.51 cm. Aortic valve mean gradient measures 20.0 mmHg. Aortic valve Vmax  measures 2.46 m/s. The degree of AS  may be underestimated in the setting of LV dysfunction. rree-=--  6. Aortic dilatation noted. There is mild dilatation of the aortic root  and of the ascending aorta measuring 42 mm and 41mm respectively.  7. The inferior vena cava is dilated in size with >50% respiratory  variability, suggesting right atrial pressure of 8 mmHg.   Echo 04/01/20: IMPRESSIONS    1. LVEF has decreased from 12/10/2019, now 30-35% with LV dyssynchrony.  Aortic stenosis is most probably severe with low flow low gradient,  dimensionless index 0.26.  2. Left ventricular ejection fraction, by estimation, is 30 to 35%. The  left ventricle has moderately decreased function. The left ventricle  demonstrates global hypokinesis. The left ventricular internal cavity size  was mildly dilated. There is severe  asymmetric left ventricular hypertrophy of the basal-septal segment. Left  ventricular diastolic function could not be evaluated.  3. Right ventricular systolic function is mildly reduced. The right  ventricular size is mildly enlarged. There is moderately elevated  pulmonary artery systolic pressure. The estimated right ventricular  systolic pressure is 55.2 mmHg.  4. Left atrial size was severely dilated.  5. Right atrial size was moderately dilated.  6. The mitral valve is normal in structure. Moderate mitral valve  regurgitation. No evidence of mitral stenosis.  7. The aortic valve is normal in structure. Aortic valve regurgitation is  not visualized. Moderate to severe aortic valve stenosis. Aortic valve  mean gradient measures 24.0 mmHg.  8. Aortic dilatation noted. There is mild to moderate dilatation of the  ascending aorta, measuring 44 mm.  9. The inferior vena cava is dilated in size with <50%  respiratory  variability, suggesting right atrial pressure of 15  mmHg.   ASSESSMENT AND PLAN:  1. Acute on chronic combined systolic/diastolic CHF. EF 30-35%.  Patient is s/p AVR for severe AS and CABG for severe multivessel CAD. Prolonged hospital course due to persistent CHF with low output. Concerned that weight is increased - recommend increased  lasix to 40 mg daily.  -daily weight - sodium and fluid restriction. - will try to initiate Entresto 24/26 mg daily. Eventually increase to bid if BP allows. . - has follow up in the Heart failure clinic in a couple of weeks.   2. Severe low flow AS.  - s/p AVR. Follow up Echo showed good valve function.   3. CAD severe s/p CABG. No active angina.   4. Post op Afib. On amiodarone. Appears to be regular rhythm today.  5. Severe deconditioning.  6.  Hyperlipidemia - statin therapy held post op due to severe deconditioning. Will plan to resume once physical condition improved.  7. CKD. Stage 3a     Current medicines are reviewed at length with the patient today.  The patient does not have concerns regarding medicines.  The following changes have been made:  See above  Labs/ tests ordered today include:   No orders of the defined types were placed in this encounter.    Disposition: see above.   Signed, Marliyah Reid [place], MD  08/25/2020 1:18 PM    [facility] [address] [place], [place], [address] Phone [phone], Fax [phone]

## 2020-08-26 DIAGNOSIS — N312 Flaccid neuropathic bladder, not elsewhere classified: Secondary | ICD-10-CM | POA: Diagnosis not present

## 2020-08-26 DIAGNOSIS — Z951 Presence of aortocoronary bypass graft: Secondary | ICD-10-CM | POA: Diagnosis not present

## 2020-08-26 DIAGNOSIS — I5032 Chronic diastolic (congestive) heart failure: Secondary | ICD-10-CM | POA: Diagnosis not present

## 2020-08-26 DIAGNOSIS — Z48812 Encounter for surgical aftercare following surgery on the circulatory system: Secondary | ICD-10-CM | POA: Diagnosis not present

## 2020-08-26 DIAGNOSIS — G250 Essential tremor: Secondary | ICD-10-CM | POA: Diagnosis not present

## 2020-08-26 DIAGNOSIS — Z952 Presence of prosthetic heart valve: Secondary | ICD-10-CM | POA: Diagnosis not present

## 2020-08-26 DIAGNOSIS — I2510 Atherosclerotic heart disease of native coronary artery without angina pectoris: Secondary | ICD-10-CM | POA: Diagnosis not present

## 2020-08-26 DIAGNOSIS — I110 Hypertensive heart disease with heart failure: Secondary | ICD-10-CM | POA: Diagnosis not present

## 2020-08-26 DIAGNOSIS — M1990 Unspecified osteoarthritis, unspecified site: Secondary | ICD-10-CM | POA: Diagnosis not present

## 2020-08-26 DIAGNOSIS — N139 Obstructive and reflux uropathy, unspecified: Secondary | ICD-10-CM | POA: Diagnosis not present

## 2020-08-28 ENCOUNTER — Ambulatory Visit: Payer: Medicare HMO | Admitting: Cardiology

## 2020-08-28 ENCOUNTER — Encounter: Payer: Self-pay | Admitting: Cardiology

## 2020-08-28 ENCOUNTER — Other Ambulatory Visit: Payer: Self-pay

## 2020-08-28 VITALS — BP 115/60 | HR 84 | Ht 69.0 in | Wt 169.8 lb

## 2020-08-28 DIAGNOSIS — I2510 Atherosclerotic heart disease of native coronary artery without angina pectoris: Secondary | ICD-10-CM

## 2020-08-28 DIAGNOSIS — Z952 Presence of prosthetic heart valve: Secondary | ICD-10-CM | POA: Diagnosis not present

## 2020-08-28 DIAGNOSIS — I480 Paroxysmal atrial fibrillation: Secondary | ICD-10-CM | POA: Diagnosis not present

## 2020-08-28 DIAGNOSIS — I5042 Chronic combined systolic (congestive) and diastolic (congestive) heart failure: Secondary | ICD-10-CM | POA: Diagnosis not present

## 2020-08-28 DIAGNOSIS — N1831 Chronic kidney disease, stage 3a: Secondary | ICD-10-CM

## 2020-08-28 NOTE — Patient Instructions (Signed)
Increase lasix to 40 mg daily  Start Entresto 24/26 mg daily initially. Will eventually increase to bid if BP tolerates.

## 2020-08-29 ENCOUNTER — Other Ambulatory Visit: Payer: Self-pay | Admitting: *Deleted

## 2020-08-29 DIAGNOSIS — I2510 Atherosclerotic heart disease of native coronary artery without angina pectoris: Secondary | ICD-10-CM | POA: Diagnosis not present

## 2020-08-29 DIAGNOSIS — M1990 Unspecified osteoarthritis, unspecified site: Secondary | ICD-10-CM | POA: Diagnosis not present

## 2020-08-29 DIAGNOSIS — N139 Obstructive and reflux uropathy, unspecified: Secondary | ICD-10-CM | POA: Diagnosis not present

## 2020-08-29 DIAGNOSIS — Z48812 Encounter for surgical aftercare following surgery on the circulatory system: Secondary | ICD-10-CM | POA: Diagnosis not present

## 2020-08-29 DIAGNOSIS — N312 Flaccid neuropathic bladder, not elsewhere classified: Secondary | ICD-10-CM | POA: Diagnosis not present

## 2020-08-29 DIAGNOSIS — I110 Hypertensive heart disease with heart failure: Secondary | ICD-10-CM | POA: Diagnosis not present

## 2020-08-29 DIAGNOSIS — I5032 Chronic diastolic (congestive) heart failure: Secondary | ICD-10-CM | POA: Diagnosis not present

## 2020-08-29 DIAGNOSIS — Z952 Presence of prosthetic heart valve: Secondary | ICD-10-CM | POA: Diagnosis not present

## 2020-08-29 DIAGNOSIS — G250 Essential tremor: Secondary | ICD-10-CM | POA: Diagnosis not present

## 2020-08-29 DIAGNOSIS — Z951 Presence of aortocoronary bypass graft: Secondary | ICD-10-CM | POA: Diagnosis not present

## 2020-08-29 NOTE — Patient Instructions (Signed)
Sacubitril; Valsartan Oral Tablets What is this medicine? SACUBITRIL; VALSARTAN (sak UE bi tril; val SAR tan) is a combination of a neprilysin inhibitor and a an angiotensin II receptor blocker. It treats heart failure. This medicine may be used for other purposes; ask your health care provider or pharmacist if you have questions. COMMON BRAND NAME(S): Entresto What should I tell my health care provider before I take this medicine? They need to know if you have any of these conditions:  diabetes and take a medicine that contains aliskiren  high levels of potassium in the blood  kidney disease  liver disease  low blood pressure  an unusual or allergic reaction to sacubitril; valsartan, drugs called angiotensin converting enzyme (ACE) inhibitors, angiotensin II receptor blockers (ARBs), other medicines, foods, dyes, or preservatives  pregnant or trying to get pregnant  breast-feeding How should I use this medicine? Take this medicine by mouth. Take it as directed on the prescription label at the same time every day. You can take it with or without food. If it upsets your stomach, take it with food. Keep taking it unless your health care provider tells you to stop. Talk to your health care provider about the use of this drug in children. While it may be prescribed for children as young as 1 for selected conditions, precautions do apply. Overdosage: If you think you have taken too much of this medicine contact a poison control center or emergency room at once. NOTE: This medicine is only for you. Do not share this medicine with others. What if I miss a dose? If you miss a dose, take it as soon as you can. If it is almost time for your next dose, take only that dose. Do not take double or extra doses. What may interact with this medicine? Do not take this medicine with any of the following medicines:  aliskiren if you have diabetes  angiotensin-converting enzyme (ACE) inhibitors, like  benazepril, captopril, enalapril, fosinopril, lisinopril, or ramipril  tranylcypromine This medicine may also interact with the following medicines:  angiotensin II receptor blockers (ARBs) like azilsartan, candesartan, eprosartan, irbesartan, losartan, olmesartan, telmisartan, or valsartan  celecoxib  lithium  NSAIDS, medicines for pain and inflammation, like ibuprofen or naproxen  potassium-sparing diuretics like amiloride, spironolactone, and triamterene  potassium supplements This list may not describe all possible interactions. Give your health care provider a list of all the medicines, herbs, non-prescription drugs, or dietary supplements you use. Also tell them if you smoke, drink alcohol, or use illegal drugs. Some items may interact with your medicine. What should I watch for while using this medicine? Tell your doctor or health care provider if your symptoms do not start to get better or if they get worse. Do not become pregnant while taking this medicine. Women should inform their health care provider if they wish to become pregnant or think they might be pregnant. There is a potential for serious harm to an unborn child. Talk to your health care provider for more information. You may get drowsy or dizzy. Do not drive, use machinery, or do anything that needs mental alertness until you know how this medicine affects you. Do not stand or sit up quickly, especially if you are an older patient. This reduces the risk of dizzy or fainting spells. Alcohol may interfere with the effects of this medicine. Avoid alcoholic drinks. Avoid salt substitutes unless you are told otherwise by your health care provider. What side effects may I notice from receiving this  medicine? Side effects that you should report to your doctor or health care provider as soon as possible:  allergic reactions (skin rash, itching or hives; swelling of the face, lips, or tongue)  high potassium levels (chest  pain; fast, irregular heartbeat; muscle weakness)  kidney injury (trouble passing urine or change in the amount of urine)  low blood pressure (dizziness; feeling faint or lightheaded, falls; unusually weak or tired) Side effects that usually do not require medical attention (report to your doctor or health care provider if they continue or are bothersome):  cough This list may not describe all possible side effects. Call your doctor for medical advice about side effects. You may report side effects to FDA at 1-800-FDA-1088. Where should I keep my medicine? Keep out of the reach of children and pets. Store at room temperature between 20 and 25 degrees C (68 and 77 degrees F). Protect from moisture. Keep the container tightly closed. Get rid of any unused medicine after the expiration date. To get rid of medicines that are no longer needed or have expired:  Take the medicine to a take-back program. Check with your pharmacy or law enforcement to find a location.  If you cannot return the medicine, check the label or package insert to see if the medicine should be thrown out in the garbage or flushed down the toilet. If you are not sure, ask your health care provider. If it is safe to put it in the trash, empty the medicine out of the container. Mix the medicine with cat litter, dirt, coffee grounds, or other unwanted substance. Seal the mixture in a bag or container. Put it in the trash. NOTE: This sheet is a summary. It may not cover all possible information. If you have questions about this medicine, talk to your doctor, pharmacist, or health care provider.  2021 Elsevier/Gold Standard (2019-08-20 11:23:32)

## 2020-08-29 NOTE — Patient Outreach (Signed)
[facility]) Care Management  08/29/2020  [name] [name] Jun 17, 1928 [id number]   Weekly transition of care call placed to member's wife.  She report member is still having issues with sleep, fatigue and uneasiness, this was discussed with physicians this week during visit.  [name] was added to medication regime, waiting for pharmacy to call when it is ready for pickup.  Per MD note, member has more swelling in extremities and weight elevated.  Lasix was increased and Entresto added.  Wife denies that member is weighing daily, encouraged to do so.  Advised to have therapy sessions include standing on scale independently for daily weights.  She verbalizes understanding.  Already monitoring daily HR and BP, report these readings have been "good."  Member has follow up with cardiology and PCP on 3/21.  She will call to reschedule the PCP visit in effort not to tire member out for the day.  Denies any urgent concerns, encouraged to contact this care manager with questions.  Agrees to follow up within the next month.  Goals Addressed            This Visit's Progress   . [facility] - Improve My Heart Health-Coronary Artery Disease   On track    Timeframe:  Long-Range Goal Priority:  High Start Date:        07/30/2020                     Expected End Date:       09/27/2020                Follow Up Date 09/19/2020   - be open to making changes - learn about small changes that will make a big difference    Why is this important?    Lifestyle changes are key to improving the blood flow to your heart. Think about the things you can change and set a goal to live healthy.   Remember, when the blood vessels to your heart start to get clogged you may not have any symptoms.   Over time, they can get worse.   Don't ignore the signs, like chest pain, and get help right away.     Notes:   Encouraged to contact MD with any potential complications  3/4 - Discussed medication changes with wife,  encouraged to monitor BP, HR, and weights daily    . [facility] - Make and Keep All Appointments   On track    Timeframe:  Short-Term Goal Priority:  Medium Start Date:         08/29/2020                    Expected End Date:     4/4       Follow Up Date 3/25   - ask family or friend for a ride - call to cancel if needed - keep a calendar with prescription refill dates    Why is this important?    Part of staying healthy is seeing the doctor for follow-up care.   If you forget your appointments, there are some things you can do to stay on track.    Notes:   2/9 - Upcoming appointments reviewed with wife  2/25 - HF clinic visit reviewed with wife, reminded of upcoming appointments  3/4 - Reviewed PCP visit, discussed follow up.  Encouraged to reschedule if needed      [name], RN, MSN [place]  Care Manager [phone]

## 2020-08-30 DIAGNOSIS — M1990 Unspecified osteoarthritis, unspecified site: Secondary | ICD-10-CM | POA: Diagnosis not present

## 2020-08-30 DIAGNOSIS — N312 Flaccid neuropathic bladder, not elsewhere classified: Secondary | ICD-10-CM | POA: Diagnosis not present

## 2020-08-30 DIAGNOSIS — I110 Hypertensive heart disease with heart failure: Secondary | ICD-10-CM | POA: Diagnosis not present

## 2020-08-30 DIAGNOSIS — Z951 Presence of aortocoronary bypass graft: Secondary | ICD-10-CM | POA: Diagnosis not present

## 2020-08-30 DIAGNOSIS — Z48812 Encounter for surgical aftercare following surgery on the circulatory system: Secondary | ICD-10-CM | POA: Diagnosis not present

## 2020-08-30 DIAGNOSIS — Z952 Presence of prosthetic heart valve: Secondary | ICD-10-CM | POA: Diagnosis not present

## 2020-08-30 DIAGNOSIS — N139 Obstructive and reflux uropathy, unspecified: Secondary | ICD-10-CM | POA: Diagnosis not present

## 2020-08-30 DIAGNOSIS — G250 Essential tremor: Secondary | ICD-10-CM | POA: Diagnosis not present

## 2020-08-30 DIAGNOSIS — I5032 Chronic diastolic (congestive) heart failure: Secondary | ICD-10-CM | POA: Diagnosis not present

## 2020-08-30 DIAGNOSIS — I2510 Atherosclerotic heart disease of native coronary artery without angina pectoris: Secondary | ICD-10-CM | POA: Diagnosis not present

## 2020-09-02 DIAGNOSIS — Z951 Presence of aortocoronary bypass graft: Secondary | ICD-10-CM | POA: Diagnosis not present

## 2020-09-02 DIAGNOSIS — Z952 Presence of prosthetic heart valve: Secondary | ICD-10-CM | POA: Diagnosis not present

## 2020-09-02 DIAGNOSIS — N139 Obstructive and reflux uropathy, unspecified: Secondary | ICD-10-CM | POA: Diagnosis not present

## 2020-09-02 DIAGNOSIS — Z48812 Encounter for surgical aftercare following surgery on the circulatory system: Secondary | ICD-10-CM | POA: Diagnosis not present

## 2020-09-02 DIAGNOSIS — G250 Essential tremor: Secondary | ICD-10-CM | POA: Diagnosis not present

## 2020-09-02 DIAGNOSIS — I5032 Chronic diastolic (congestive) heart failure: Secondary | ICD-10-CM | POA: Diagnosis not present

## 2020-09-02 DIAGNOSIS — M1990 Unspecified osteoarthritis, unspecified site: Secondary | ICD-10-CM | POA: Diagnosis not present

## 2020-09-02 DIAGNOSIS — N312 Flaccid neuropathic bladder, not elsewhere classified: Secondary | ICD-10-CM | POA: Diagnosis not present

## 2020-09-02 DIAGNOSIS — I110 Hypertensive heart disease with heart failure: Secondary | ICD-10-CM | POA: Diagnosis not present

## 2020-09-02 DIAGNOSIS — I2510 Atherosclerotic heart disease of native coronary artery without angina pectoris: Secondary | ICD-10-CM | POA: Diagnosis not present

## 2020-09-03 DIAGNOSIS — Z952 Presence of prosthetic heart valve: Secondary | ICD-10-CM | POA: Diagnosis not present

## 2020-09-03 DIAGNOSIS — I5032 Chronic diastolic (congestive) heart failure: Secondary | ICD-10-CM | POA: Diagnosis not present

## 2020-09-03 DIAGNOSIS — I2510 Atherosclerotic heart disease of native coronary artery without angina pectoris: Secondary | ICD-10-CM | POA: Diagnosis not present

## 2020-09-03 DIAGNOSIS — I110 Hypertensive heart disease with heart failure: Secondary | ICD-10-CM | POA: Diagnosis not present

## 2020-09-03 DIAGNOSIS — N139 Obstructive and reflux uropathy, unspecified: Secondary | ICD-10-CM | POA: Diagnosis not present

## 2020-09-03 DIAGNOSIS — N312 Flaccid neuropathic bladder, not elsewhere classified: Secondary | ICD-10-CM | POA: Diagnosis not present

## 2020-09-03 DIAGNOSIS — Z48812 Encounter for surgical aftercare following surgery on the circulatory system: Secondary | ICD-10-CM | POA: Diagnosis not present

## 2020-09-03 DIAGNOSIS — M1990 Unspecified osteoarthritis, unspecified site: Secondary | ICD-10-CM | POA: Diagnosis not present

## 2020-09-03 DIAGNOSIS — G250 Essential tremor: Secondary | ICD-10-CM | POA: Diagnosis not present

## 2020-09-03 DIAGNOSIS — Z951 Presence of aortocoronary bypass graft: Secondary | ICD-10-CM | POA: Diagnosis not present

## 2020-09-05 ENCOUNTER — Other Ambulatory Visit: Payer: Self-pay

## 2020-09-05 DIAGNOSIS — M1990 Unspecified osteoarthritis, unspecified site: Secondary | ICD-10-CM | POA: Diagnosis not present

## 2020-09-05 DIAGNOSIS — N312 Flaccid neuropathic bladder, not elsewhere classified: Secondary | ICD-10-CM | POA: Diagnosis not present

## 2020-09-05 DIAGNOSIS — I110 Hypertensive heart disease with heart failure: Secondary | ICD-10-CM | POA: Diagnosis not present

## 2020-09-05 DIAGNOSIS — Z951 Presence of aortocoronary bypass graft: Secondary | ICD-10-CM | POA: Diagnosis not present

## 2020-09-05 DIAGNOSIS — G250 Essential tremor: Secondary | ICD-10-CM | POA: Diagnosis not present

## 2020-09-05 DIAGNOSIS — I5032 Chronic diastolic (congestive) heart failure: Secondary | ICD-10-CM | POA: Diagnosis not present

## 2020-09-05 DIAGNOSIS — I2510 Atherosclerotic heart disease of native coronary artery without angina pectoris: Secondary | ICD-10-CM | POA: Diagnosis not present

## 2020-09-05 DIAGNOSIS — Z952 Presence of prosthetic heart valve: Secondary | ICD-10-CM | POA: Diagnosis not present

## 2020-09-05 DIAGNOSIS — N139 Obstructive and reflux uropathy, unspecified: Secondary | ICD-10-CM | POA: Diagnosis not present

## 2020-09-05 DIAGNOSIS — Z48812 Encounter for surgical aftercare following surgery on the circulatory system: Secondary | ICD-10-CM | POA: Diagnosis not present

## 2020-09-09 DIAGNOSIS — M1990 Unspecified osteoarthritis, unspecified site: Secondary | ICD-10-CM | POA: Diagnosis not present

## 2020-09-09 DIAGNOSIS — Z951 Presence of aortocoronary bypass graft: Secondary | ICD-10-CM | POA: Diagnosis not present

## 2020-09-09 DIAGNOSIS — I2510 Atherosclerotic heart disease of native coronary artery without angina pectoris: Secondary | ICD-10-CM | POA: Diagnosis not present

## 2020-09-09 DIAGNOSIS — N139 Obstructive and reflux uropathy, unspecified: Secondary | ICD-10-CM | POA: Diagnosis not present

## 2020-09-09 DIAGNOSIS — I5032 Chronic diastolic (congestive) heart failure: Secondary | ICD-10-CM | POA: Diagnosis not present

## 2020-09-09 DIAGNOSIS — Z48812 Encounter for surgical aftercare following surgery on the circulatory system: Secondary | ICD-10-CM | POA: Diagnosis not present

## 2020-09-09 DIAGNOSIS — Z952 Presence of prosthetic heart valve: Secondary | ICD-10-CM | POA: Diagnosis not present

## 2020-09-09 DIAGNOSIS — I110 Hypertensive heart disease with heart failure: Secondary | ICD-10-CM | POA: Diagnosis not present

## 2020-09-09 DIAGNOSIS — G250 Essential tremor: Secondary | ICD-10-CM | POA: Diagnosis not present

## 2020-09-09 DIAGNOSIS — N312 Flaccid neuropathic bladder, not elsewhere classified: Secondary | ICD-10-CM | POA: Diagnosis not present

## 2020-09-10 ENCOUNTER — Encounter: Payer: Self-pay | Admitting: Internal Medicine

## 2020-09-10 ENCOUNTER — Other Ambulatory Visit: Payer: Self-pay

## 2020-09-10 ENCOUNTER — Ambulatory Visit: Payer: Medicare HMO | Admitting: Internal Medicine

## 2020-09-10 VITALS — BP 100/64 | HR 83 | Ht 69.0 in | Wt 166.0 lb

## 2020-09-10 DIAGNOSIS — Z952 Presence of prosthetic heart valve: Secondary | ICD-10-CM | POA: Diagnosis not present

## 2020-09-10 DIAGNOSIS — I4819 Other persistent atrial fibrillation: Secondary | ICD-10-CM

## 2020-09-10 DIAGNOSIS — I5042 Chronic combined systolic (congestive) and diastolic (congestive) heart failure: Secondary | ICD-10-CM | POA: Diagnosis not present

## 2020-09-10 MED ORDER — AMIODARONE HCL 200 MG PO TABS: 100.0000 mg | ORAL_TABLET | Freq: Every day | ORAL | Status: DC

## 2020-09-10 NOTE — Patient Instructions (Addendum)
Medication Instructions:  Reduce your Amiodarone to 100 mg, then stop Amiodarone in 4 weeks.  Your physician recommends that you continue on your current medications as directed. Please refer to the Current Medication list given to you today.  Labwork: None ordered.  Testing/Procedures: None ordered.  Follow-Up: Your physician wants you to follow-up in: as needed with Thompson Grayer, MD    Any Other Special Instructions Will Be Listed Below (If Applicable).  If you need a refill on your cardiac medications before your next appointment, please call your pharmacy.

## 2020-09-10 NOTE — Progress Notes (Signed)
Electrophysiology Office Note   Date:  09/10/2020   ID:  [name] [name], DOB 06/23/1928, MRN [id number]  PCP:  Cari Caraway, MD  Cardiologist:  Dr Martinique CHF:  Dr Haroldine Laws Primary Electrophysiologist: Thompson Grayer, MD    CC: "I dont know why I am here.  We called the heart failure clinic and they could not tell me why I was referred".  On review, from telephone note 08/21/20, it appears that the patient has been referred by Cecile Hearing to EP due to long PR interval on ekg.   History of Present Illness: [name] [name] is a 87 y.o. male who presents today for electrophysiology evaluation.  He is s/p prior CABG and AVR in 04/2020.  He has had a very long recovery from this.  He had post operative afib.  He was placed on amiodarone and subsequently converted spontaneously to sinus rhythm. He has chronic systolic dysfunction with EF 30%.  He has severe LA enlargement and at least moderate RA enlargement with moderate to severe MR.  He has first degree AV block with LBBB and QRS > 150 msec since 05/23/20 ekg. He has fatigue.  He is making slow recovery from surgery.  He has rare dizziness correlating to low BP (recently started on entresto) but no presyncope or syncope.  He follows his HR closely and has not seen any heart rates less than 50s bpm. Today, he denies symptoms of palpitations, chest pain, or shortness of breath.  The patient is tolerating medications without difficulties and is otherwise without complaint today.    Past Medical History:  Diagnosis Date   Aortic stenosis    Arthritis    CAD (coronary artery disease)    Colitis    Edema    Essential hypertension    no med now   Essential tremor    Hyperlipidemia    Hypotonic bladder    L4-L5   Obstructive uropathy    Peripheral neuropathy    Elevated by Dr. love 2010   Prostate cancer [facility]) 2005   Past Surgical History:  Procedure Laterality Date   ACHILLES TENDON SURGERY Bilateral 1995   AORTIC VALVE  REPLACEMENT N/A 05/23/2020   Procedure: AORTIC VALVE REPLACEMENT (AVR) USING EDWARDS RESILIA 29 MM AORTIC VALVE.;  Surgeon: Gaye Pollack, MD;  Location: [facility];  Service: Open Heart Surgery;  Laterality: N/A;   COLONOSCOPY     With polyp resection   CORONARY ARTERY BYPASS GRAFT N/A 05/23/2020   Procedure: CORONARY ARTERY BYPASS GRAFTING (CABG) USING LIMA to LAD; ENDOSCOPIC HARVESTED RIGHT GREATER SAPHENOUS VEIN: SVG to PD; SVG to SEQUENCED INTERMEDIATE to OM;  Surgeon: Gaye Pollack, MD;  Location: [place];  Service: Open Heart Surgery;  Laterality: N/A;   ENDOVEIN HARVEST OF GREATER SAPHENOUS VEIN Right 05/23/2020   Procedure: ENDOVEIN HARVEST OF GREATER SAPHENOUS VEIN;  Surgeon: Gaye Pollack, MD;  Location: [place];  Service: Open Heart Surgery;  Laterality: Right;   REVERSE SHOULDER ARTHROPLASTY Right 06/04/2016   REVERSE SHOULDER ARTHROPLASTY Right 06/04/2016   Procedure: REVERSE SHOULDER ARTHROPLASTY;  Surgeon: Netta Cedars, MD;  Location: [place];  Service: Orthopedics;  Laterality: Right;   RIGHT/LEFT HEART CATH AND CORONARY ANGIOGRAPHY N/A 05/20/2020   Procedure: RIGHT/LEFT HEART CATH AND CORONARY ANGIOGRAPHY;  Surgeon: Martinique, Peter M, MD;  Location: [place] CV LAB;  Service: Cardiovascular;  Laterality: N/A;   SKIN CANCER EXCISION  1974   On chest wall   TEE WITHOUT CARDIOVERSION N/A 05/23/2020   Procedure: TRANSESOPHAGEAL  ECHOCARDIOGRAM (TEE);  Surgeon: Gaye Pollack, MD;  Location: [place];  Service: Open Heart Surgery;  Laterality: N/A;   TOTAL KNEE ARTHROPLASTY Right 2011   TOTAL KNEE ARTHROPLASTY Left 04/09/2013   Procedure: LEFT TOTAL KNEE ARTHROPLASTY;  Surgeon: Gearlean Alf, MD;  Location: [facility];  Service: Orthopedics;  Laterality: Left;     Current Outpatient Medications  Medication Sig Dispense Refill   amiodarone (PACERONE) 200 MG tablet Take 1 tablet (200 mg total) by mouth daily. 90 tablet 3   apixaban (ELIQUIS) 5 MG TABS tablet Take 1 tablet (5 mg  total) by mouth 2 (two) times daily. 180 tablet 1   Ascorbic Acid (VITAMIN C) 100 MG tablet Take 100 mg by mouth daily.     aspirin 81 MG tablet Take 81 mg by mouth at bedtime.      Cholecalciferol (VITAMIN D) 50 MCG (2000 UT) tablet Take 1 tablet by mouth daily.     docusate sodium (COLACE) 100 MG capsule Take 1 capsule (100 mg total) by mouth 2 (two) times daily. 10 capsule 0   dutasteride (AVODART) 0.5 MG capsule Take 0.5 mg by mouth daily.     eszopiclone (LUNESTA) 1 MG TABS tablet Take 1-2 mg by mouth at bedtime.     feeding supplement (ENSURE ENLIVE / ENSURE PLUS) LIQD Take 237 mLs by mouth 4 (four) times daily. 237 mL 12   ferrous [id number] C-folic acid (TRINSICON / FOLTRIN) capsule Take 1 capsule by mouth 2 (two) times daily after a meal.     furosemide (LASIX) 40 MG tablet Take 1 tablet (40 mg total) by mouth daily. 30 tablet 11   melatonin 5 MG TABS Take 1 tablet (5 mg total) by mouth at bedtime as needed (sleep).  0   Multiple Vitamins-Minerals (CENTRUM SILVER PO) Take 1 tablet by mouth daily.      ondansetron (ZOFRAN) 4 MG tablet Take 1 tablet (4 mg total) by mouth every 8 (eight) hours as needed for nausea or vomiting. 20 tablet 0   sertraline (ZOLOFT) 50 MG tablet Take 1 tablet by mouth daily.     sulfaSALAzine (AZULFIDINE) 500 MG tablet Take 2,000 mg by mouth daily.     tamsulosin (FLOMAX) 0.4 MG CAPS Take 0.4 mg by mouth 2 (two) times daily.      vitamin B-12 (CYANOCOBALAMIN) 100 MCG tablet Take 1 tablet by mouth daily.     No current facility-administered medications for this visit.    Allergies:   Simvastatin   Social History:  The patient  reports that he has never smoked. He has never used smokeless tobacco. He reports current alcohol use of about 1.0 standard drink of alcohol per week. He reports that he does not use drugs.   Family History:  The patient's family history includes Diabetes in his mother; Heart attack in his brother and maternal  aunt; Heart failure in his mother; Hypertension in his mother.    ROS:  Please see the history of present illness.   All other systems are personally reviewed and negative.    PHYSICAL EXAM: VS:  BP 100/64    Pulse 83    Ht 5\' 9"  (1.753 m)    Wt 166 lb (75.3 kg)    SpO2 96%    BMI 24.51 kg/m  , BMI Body mass index is 24.51 kg/m. GEN: elderly, in no acute distress HEENT: normal Neck: no JVD  Cardiac: RRR Respiratory:    normal work of breathing GI: soft  MS: diffuse atrophy Skin: warm and dry  Neuro:  Strength and sensation are intact Psych: euthymic mood, full affect  EKG:  EKG is ordered today. The ekg ordered today is personally reviewed and shows sinus rhythm with first degree AV block, LBBB   Recent Labs: 01/02/2020: Pro B Natriuretic peptide (BNP) 1,167.0 06/02/2020: ALT 20 06/13/2020: TSH 1.920 06/23/2020: Magnesium 2.2 07/24/2020: BNP 1,251.5 08/18/2020: BUN 23; Creatinine, Ser 1.20; Hemoglobin 11.1; Platelets 181; Potassium 4.1; Sodium 133  personally reviewed   Lipid Panel  No results found for: CHOL, TRIG, HDL, CHOLHDL, VLDL, LDLCALC, LDLDIRECT personally reviewed   Wt Readings from Last 3 Encounters:  09/10/20 166 lb (75.3 kg)  08/28/20 169 lb 12.8 oz (77 kg)  08/18/20 173 lb 12.8 oz (78.8 kg)      Other studies personally reviewed: Additional studies/ records that were reviewed today include: hospital records,  Dr Morrison Old notes, recent CHF clinic notes  Review of the above records today demonstrates: as above   ASSESSMENT AND PLAN:  1.  Chronic conduction system disease He has first degree AV block and LBBB.  He has not had any documentation of more advanced conduction system disease.  He is asymptomatic. Reduce amiodarone to 100mg  daily today.  Stop amiodarone in 4 weeks. We could consider PPM implantation with conduction system pacing vs CRT-P, though given advanced age and slow recovery from recent surgery, I think that we should avoid this if able.  I  have discussed at length with the patient and his family member today.  Using a shared decision making process, we have decided on conservative management at this time.  2. Post operative afib No recurrence Stop amiodarone as above Given atriopathy, he is likely to have additional AF in the future.  I think that given prolonged AV, it is worth at least trying him off of this medicine  3. Chronic ischemic CM entresto recently started Medical therapy Given advanced age, he is not an ICD candidate   Follow-up:  Drs Martinique and [place] as scheduled I will see as needed   Signed, Thompson Grayer, MD  09/10/2020 11:10 AM     [facility] [address] [place] [address] [phone] (office) [phone] (fax)

## 2020-09-12 DIAGNOSIS — G250 Essential tremor: Secondary | ICD-10-CM | POA: Diagnosis not present

## 2020-09-12 DIAGNOSIS — I2510 Atherosclerotic heart disease of native coronary artery without angina pectoris: Secondary | ICD-10-CM | POA: Diagnosis not present

## 2020-09-12 DIAGNOSIS — Z48812 Encounter for surgical aftercare following surgery on the circulatory system: Secondary | ICD-10-CM | POA: Diagnosis not present

## 2020-09-12 DIAGNOSIS — N312 Flaccid neuropathic bladder, not elsewhere classified: Secondary | ICD-10-CM | POA: Diagnosis not present

## 2020-09-12 DIAGNOSIS — Z951 Presence of aortocoronary bypass graft: Secondary | ICD-10-CM | POA: Diagnosis not present

## 2020-09-12 DIAGNOSIS — Z952 Presence of prosthetic heart valve: Secondary | ICD-10-CM | POA: Diagnosis not present

## 2020-09-12 DIAGNOSIS — M1990 Unspecified osteoarthritis, unspecified site: Secondary | ICD-10-CM | POA: Diagnosis not present

## 2020-09-12 DIAGNOSIS — I110 Hypertensive heart disease with heart failure: Secondary | ICD-10-CM | POA: Diagnosis not present

## 2020-09-12 DIAGNOSIS — I5032 Chronic diastolic (congestive) heart failure: Secondary | ICD-10-CM | POA: Diagnosis not present

## 2020-09-12 DIAGNOSIS — N139 Obstructive and reflux uropathy, unspecified: Secondary | ICD-10-CM | POA: Diagnosis not present

## 2020-09-15 ENCOUNTER — Ambulatory Visit (HOSPITAL_COMMUNITY)
Admission: RE | Admit: 2020-09-15 | Discharge: 2020-09-15 | Disposition: A | Discharge status: Home / Self Care | Payer: Medicare HMO | Source: Ambulatory Visit | Attending: Adult Health | Admitting: Adult Health

## 2020-09-15 ENCOUNTER — Encounter (HOSPITAL_COMMUNITY): Payer: Self-pay

## 2020-09-15 ENCOUNTER — Other Ambulatory Visit: Payer: Self-pay

## 2020-09-15 VITALS — BP 90/70 | HR 76 | Wt 169.0 lb

## 2020-09-15 DIAGNOSIS — Z7901 Long term (current) use of anticoagulants: Secondary | ICD-10-CM | POA: Insufficient documentation

## 2020-09-15 DIAGNOSIS — I130 Hypertensive heart and chronic kidney disease with heart failure and stage 1 through stage 4 chronic kidney disease, or unspecified chronic kidney disease: Secondary | ICD-10-CM | POA: Insufficient documentation

## 2020-09-15 DIAGNOSIS — N139 Obstructive and reflux uropathy, unspecified: Secondary | ICD-10-CM | POA: Diagnosis not present

## 2020-09-15 DIAGNOSIS — R69 Illness, unspecified: Secondary | ICD-10-CM | POA: Diagnosis not present

## 2020-09-15 DIAGNOSIS — Z8249 Family history of ischemic heart disease and other diseases of the circulatory system: Secondary | ICD-10-CM | POA: Insufficient documentation

## 2020-09-15 DIAGNOSIS — D62 Acute posthemorrhagic anemia: Secondary | ICD-10-CM | POA: Diagnosis not present

## 2020-09-15 DIAGNOSIS — N312 Flaccid neuropathic bladder, not elsewhere classified: Secondary | ICD-10-CM | POA: Diagnosis not present

## 2020-09-15 DIAGNOSIS — M1990 Unspecified osteoarthritis, unspecified site: Secondary | ICD-10-CM | POA: Diagnosis not present

## 2020-09-15 DIAGNOSIS — F419 Anxiety disorder, unspecified: Secondary | ICD-10-CM | POA: Diagnosis not present

## 2020-09-15 DIAGNOSIS — I350 Nonrheumatic aortic (valve) stenosis: Secondary | ICD-10-CM | POA: Insufficient documentation

## 2020-09-15 DIAGNOSIS — F32A Depression, unspecified: Secondary | ICD-10-CM | POA: Diagnosis not present

## 2020-09-15 DIAGNOSIS — Z7982 Long term (current) use of aspirin: Secondary | ICD-10-CM | POA: Diagnosis not present

## 2020-09-15 DIAGNOSIS — I2510 Atherosclerotic heart disease of native coronary artery without angina pectoris: Secondary | ICD-10-CM | POA: Diagnosis not present

## 2020-09-15 DIAGNOSIS — R339 Retention of urine, unspecified: Secondary | ICD-10-CM | POA: Insufficient documentation

## 2020-09-15 DIAGNOSIS — G250 Essential tremor: Secondary | ICD-10-CM | POA: Diagnosis not present

## 2020-09-15 DIAGNOSIS — I4891 Unspecified atrial fibrillation: Secondary | ICD-10-CM | POA: Insufficient documentation

## 2020-09-15 DIAGNOSIS — Z951 Presence of aortocoronary bypass graft: Secondary | ICD-10-CM | POA: Diagnosis not present

## 2020-09-15 DIAGNOSIS — G4700 Insomnia, unspecified: Secondary | ICD-10-CM | POA: Diagnosis not present

## 2020-09-15 DIAGNOSIS — I5082 Biventricular heart failure: Secondary | ICD-10-CM | POA: Diagnosis not present

## 2020-09-15 DIAGNOSIS — I110 Hypertensive heart disease with heart failure: Secondary | ICD-10-CM | POA: Diagnosis not present

## 2020-09-15 DIAGNOSIS — Z48812 Encounter for surgical aftercare following surgery on the circulatory system: Secondary | ICD-10-CM | POA: Diagnosis not present

## 2020-09-15 DIAGNOSIS — N1831 Chronic kidney disease, stage 3a: Secondary | ICD-10-CM | POA: Diagnosis not present

## 2020-09-15 DIAGNOSIS — I480 Paroxysmal atrial fibrillation: Secondary | ICD-10-CM | POA: Diagnosis not present

## 2020-09-15 DIAGNOSIS — Z79899 Other long term (current) drug therapy: Secondary | ICD-10-CM | POA: Diagnosis not present

## 2020-09-15 DIAGNOSIS — Z952 Presence of prosthetic heart valve: Secondary | ICD-10-CM | POA: Diagnosis not present

## 2020-09-15 DIAGNOSIS — I5032 Chronic diastolic (congestive) heart failure: Secondary | ICD-10-CM | POA: Diagnosis not present

## 2020-09-15 DIAGNOSIS — R5381 Other malaise: Secondary | ICD-10-CM

## 2020-09-15 DIAGNOSIS — I5042 Chronic combined systolic (congestive) and diastolic (congestive) heart failure: Secondary | ICD-10-CM | POA: Diagnosis not present

## 2020-09-15 LAB — BASIC METABOLIC PANEL
Anion gap: 8 (ref 5–15)
BUN: 26 mg/dL — ABNORMAL HIGH (ref 8–23)
CO2: 28 mmol/L (ref 22–32)
Calcium: 9 mg/dL (ref 8.9–10.3)
Chloride: 98 mmol/L (ref 98–111)
Creatinine, Ser: 1.25 mg/dL — ABNORMAL HIGH (ref 0.61–1.24)
GFR, Estimated: 55 mL/min — ABNORMAL LOW (ref >60)
Glucose, Bld: 83 mg/dL (ref 70–99)
Potassium: 4.9 mmol/L (ref 3.5–5.1)
Sodium: 134 mmol/L — ABNORMAL LOW (ref 135–145)

## 2020-09-15 NOTE — Patient Instructions (Addendum)
Labs done today. We will contact you only if your labs are abnormal.  STOP taking Amiodarone   No other medication changes were made. Please continue all current medications as prescribed.  You have been referred to [facility]. They will contact you to schedule an appointment.   Your physician recommends that you schedule a follow-up appointment in: 6 weeks for an appointment with Dr. Haroldine Laws with an echo prior to your exam.    If you have any questions or concerns before your next appointment please send [place] a message through [place] or call our office at [phone].    TO LEAVE A MESSAGE FOR THE NURSE SELECT OPTION 2, PLEASE LEAVE A MESSAGE INCLUDING: . YOUR NAME . DATE OF BIRTH . CALL BACK NUMBER . REASON FOR CALL**this is important as we prioritize the call backs  YOU WILL RECEIVE A CALL BACK THE SAME DAY AS LONG AS YOU CALL BEFORE 4:00 PM   Do the following things EVERYDAY: 1) Weigh yourself in the morning before breakfast. Write it down and keep it in a log. 2) Take your medicines as prescribed 3) Eat low salt foods--Limit salt (sodium) to 2000 mg per day.  4) Stay as active as you can everyday 5) Limit all fluids for the day to less than 2 liters   At the [facility], you and your health needs are our priority. As part of our continuing mission to provide you with exceptional heart care, we have created designated Provider Care Teams. These Care Teams include your primary Cardiologist (physician) and Advanced Practice Providers (APPs- Physician Assistants and Nurse Practitioners) who all work together to provide you with the care you need, when you need it.   You may see any of the following providers on your designated Care Team at your next follow up: Marland Kitchen Dr Glori Bickers . Dr Loralie Champagne . Darrick Grinder, NP . Lyda Jester, PA . Audry Riles, PharmD   Please be sure to bring in all your medications bottles to every appointment.

## 2020-09-15 NOTE — Progress Notes (Signed)
Advanced Heart Failure Clinic Note   PCP: Cari Caraway, MD PCP-Cardiologist: Peter Martinique, MD  HF Cardiologist: Dr. Haroldine Laws  HPI: 87 y/o male, previously very active despite age before recent illness. Had been working 20-30 hr/week for [employer] and [employer].    H/o combined systolic and diastolic HF w/ recent progressive decline in LVEF. Echo in 2014 showed normal LVEF 55-60% w/ G1DD. Echo 6/21 showed mildly reduced EF down to 40-45%. Echo repeated again 10/21 and EF had further dropped to 30-35% w/ global HK. RV systolic function mildly reduced. Also noted to have mod-sever AS. LVOT/AV VTI ratio: 0.25 and moderate MR. Had subsequent R/LHC showing severe 3V CAD. CI preserved at 2.74.    Underwent CABG x 4 (LIMA-LAD, sequential SVG-OM1 and OM2, SVG- PDA) + tissue AVR by Dr. Cyndia Bent on 11/26. Post operative course c/b atrial fibrillation treated w/ amiodarone + difficulty weaning inotropes, fluid overload and hypotension. AHF consult to assist with management. He was discharged to [facility] 06/26/20. His discharge weight was 168 lbs.  He had a follow up appointment with Dr. Martinique 1/21 and he was volume overloaded. Lasix was increased to 40 mg bid x 4 days then reduced to 40 mg daily.  Saw Dr Martinique 08/28/20 and entresto was started .  Saw Dr Rayann Heman on 09/10/20 and plan was for [facility] taper.   Today he returns for HF follow up.Overall feeling fine. Having episodes of fatigue during the day. Able to walk in the house with a cane. Denies PND/Orthopnea. Appetite has been fair. Says his taste is off. No fever or chills. Last weight at home 161 pounds. Taking all medications. He is completing home health PT. He is interested in starting cardiac rehab. .   Cardiac Studies: Echo 05/20/20: LVEF has decreased from 12/10/2019, now 30-35% with LV dyssynchrony, mod decreased function, RVSP 55.2 mmHg, severe LAE, severe AS with mean gradient 24.0 mmgHg, moderate MR, mild to moderate dilatation of  the ascending aorta, measuring 44 mm.  L/RHC 05/20/20: Mid RCA lesion is 90% stenosed, Mid RCA to Dist RCA lesion is 100% stenosed, Prox LAD to Mid LAD lesion is 85% stenosed, 1st Mrg lesion is 95% stenosed, Prox Cx to Mid Cx lesion is 100% stenosed, LV EDP is mildly elevated. Hemodynamic findings consistent with mild pulmonary hypertension. 2nd Diag lesion is 100% stenosed.  1. Severe complex 3 vessel obstructive CAD.  2. Low flow Aortic stenosis. The valve is severely calcified. By cath mean gradient is 14 mm Hg.  3. Mildly elevated LV filling pressures 4. Mild pulmonary HTN with mean PA pressure 22 mmHg 5. Cardiac index 2.74  ROS: All systems reviewed and negative except as per HPI.   Past Medical History:  Diagnosis Date  . Aortic stenosis   . Arthritis   . CAD (coronary artery disease)   . Colitis   . Edema   . Essential hypertension    no med now  . Essential tremor   . Hyperlipidemia   . Hypotonic bladder    L4-L5  . Obstructive uropathy   . Peripheral neuropathy    Elevated by Dr. love 2010  . Prostate cancer [facility]) 2005    Current Outpatient Medications  Medication Sig Dispense Refill  . amiodarone (PACERONE) 200 MG tablet Take 0.5 tablets (100 mg total) by mouth daily for 28 days. 14 tablet   . apixaban (ELIQUIS) 5 MG TABS tablet Take 1 tablet (5 mg total) by mouth 2 (two) times daily. 180 tablet 1  .  Ascorbic Acid (VITAMIN C) 100 MG tablet Take 100 mg by mouth daily.    Marland Kitchen aspirin 81 MG tablet Take 81 mg by mouth at bedtime.     . Cholecalciferol (VITAMIN D) 50 MCG (2000 UT) tablet Take 1 tablet by mouth daily.    Marland Kitchen docusate sodium (COLACE) 100 MG capsule Take 1 capsule (100 mg total) by mouth 2 (two) times daily. 10 capsule 0  . dutasteride (AVODART) 0.5 MG capsule Take 0.5 mg by mouth daily.    . eszopiclone (LUNESTA) 1 MG TABS tablet Take 1-2 mg by mouth at bedtime.    . feeding supplement (ENSURE ENLIVE / ENSURE PLUS) LIQD Take 237 mLs by mouth 4 (four) times  daily. 237 mL 12  . ferrous [id number] C-folic acid (TRINSICON / FOLTRIN) capsule Take 1 capsule by mouth 2 (two) times daily after a meal.    . furosemide (LASIX) 40 MG tablet Take 1 tablet (40 mg total) by mouth daily. 30 tablet 11  . melatonin 5 MG TABS Take 1 tablet (5 mg total) by mouth at bedtime as needed (sleep).  0  . Multiple Vitamins-Minerals (CENTRUM SILVER PO) Take 1 tablet by mouth daily.     . ondansetron (ZOFRAN) 4 MG tablet Take 1 tablet (4 mg total) by mouth every 8 (eight) hours as needed for nausea or vomiting. 20 tablet 0  . sacubitril-valsartan (ENTRESTO) 24-26 MG Take 1 tablet by mouth daily.    . sertraline (ZOLOFT) 50 MG tablet Take 1 tablet by mouth daily.    Marland Kitchen sulfaSALAzine (AZULFIDINE) 500 MG tablet Take 2,000 mg by mouth daily.    . tamsulosin (FLOMAX) 0.4 MG CAPS Take 0.4 mg by mouth 2 (two) times daily.     . vitamin B-12 (CYANOCOBALAMIN) 100 MCG tablet Take 1 tablet by mouth daily.     No current facility-administered medications for this encounter.   Allergies  Allergen Reactions  . Simvastatin Other (See Comments)    "caused neuropathy pain"    Social History   Socioeconomic History  . Marital status: Married    Spouse name: Not on file  . Number of children: Not on file  . Years of education: Not on file  . Highest education level: Not on file  Occupational History  . Not on file  Tobacco Use  . Smoking status: Never Smoker  . Smokeless tobacco: Never Used  Vaping Use  . Vaping Use: Never used  Substance and Sexual Activity  . Alcohol use: Yes    Alcohol/week: 1.0 standard drink    Types: 1 Glasses of wine per week    Comment: occ  . Drug use: No  . Sexual activity: Yes  Other Topics Concern  . Not on file  Social History Narrative  . Not on file   Social Determinants of Health   Financial Resource Strain: Not on file  Food Insecurity: No Food Insecurity  . Worried About Charity fundraiser in the Last Year: Never true   . Ran Out of Food in the Last Year: Never true  Transportation Needs: No Transportation Needs  . Lack of Transportation (Medical): No  . Lack of Transportation (Non-Medical): No  Physical Activity: Not on file  Stress: Not on file  Social Connections: Not on file  Intimate Partner Violence: Not on file   Family History  Problem Relation Age of Onset  . Hypertension Mother   . Heart failure Mother   . Diabetes Mother   . Heart attack  Brother   . Heart attack Maternal Aunt    Vitals:   09/15/20 1342  BP: 90/70  Pulse: 76  SpO2: 96%  Weight: 76.7 kg (169 lb)    Wt Readings from Last 3 Encounters:  09/15/20 76.7 kg (169 lb)  09/10/20 75.3 kg (166 lb)  08/28/20 77 kg (169 lb 12.8 oz)   PHYSICAL EXAM: General:  Walked in the clinic with a walker. No resp difficulty HEENT: normal Neck: supple. no JVD. Carotids 2+ bilat; no bruits. No lymphadenopathy or thryomegaly appreciated. Cor: PMI nondisplaced. Regular rate & rhythm. No rubs, gallops or murmurs. Lungs: clear Abdomen: soft, nontender, nondistended. No hepatosplenomegaly. No bruits or masses. Good bowel sounds. Extremities: no cyanosis, clubbing, rash, R and LLE trace-1+ edema Neuro: alert & orientedx3, cranial nerves grossly intact. moves all 4 extremities w/o difficulty. Affect pleasant  ASSESSMENT & PLAN: 1. Severe 3V CAD: - s/p CABGx 4 (LIMA-LAD, sequential SVG-OM1 and OM2, SVG- PDA) - Continue ASA 81 mg daily. - No ? blocker w/ post-cardiotomy shock and low BP. - Continue statin at next visit.    2. Aortic Stenosis, s/p tissue AVR (05/23/20) - Severe low flow low gradient AS. - AV prosthesis ok on post op echo. - Stable.   3.Chronic Biventricular Heart Failure:  - Echo in 2014 showed normal LVEF 55-60% w/ G1DD.  - Echo 10/21 and EF 30-35% w/ global HK. RV systolic function mildly reduced. - LHC this admit w/ severe 3V CAD>>ICM. Now s/p CABG. Also w/ severe AS, possibly contributing to LV dysfunction, now s/p  SAVR.  - Post-op echo 12/1 EF 30-35%, RV mildly reduced. AV prothesis ok. - Portable echo (1/22): EF 30-35% (personally reviewed by Dr. Haroldine Laws). - NYHA III. Volume status stable.  - Continue lasix 40 mg daily. - SBP low today. Recently started on entresto.  - Check BMET today.  - Off digoxin with heart block.  - No beta blocker with AV block.    4. Atrial Fibrillation, post op.   - Portable echo (1/22) EF 30-35%. - Saw Dr Rayann Heman last week with plan to stop amio. I am going to stop amio today.  - Continue Eliquis 5 mg bid.   5. CKD IIIa - Creatinine 1.2 recent check .  - Check BMET today.    6. Anemia - Expected ABLA from surgery.  - Resolved.    7. Physical Deconditioning Completing HH. Refer to cardiac rehab today.    8. Urinary Retention - Wears foley, changed every month.   9. Insomnia -Sleeping a little better.   10. Anxiety/Depression - A little better.   Follow up in 6 weeks with Dr Haroldine Laws. Repeat ECHo at that to see if EF has improved.   Anthonee Gelin NP-C  1:50 PM

## 2020-09-16 DIAGNOSIS — I110 Hypertensive heart disease with heart failure: Secondary | ICD-10-CM | POA: Diagnosis not present

## 2020-09-16 DIAGNOSIS — N139 Obstructive and reflux uropathy, unspecified: Secondary | ICD-10-CM | POA: Diagnosis not present

## 2020-09-16 DIAGNOSIS — Z951 Presence of aortocoronary bypass graft: Secondary | ICD-10-CM | POA: Diagnosis not present

## 2020-09-16 DIAGNOSIS — N312 Flaccid neuropathic bladder, not elsewhere classified: Secondary | ICD-10-CM | POA: Diagnosis not present

## 2020-09-16 DIAGNOSIS — I5032 Chronic diastolic (congestive) heart failure: Secondary | ICD-10-CM | POA: Diagnosis not present

## 2020-09-16 DIAGNOSIS — M1990 Unspecified osteoarthritis, unspecified site: Secondary | ICD-10-CM | POA: Diagnosis not present

## 2020-09-16 DIAGNOSIS — I2510 Atherosclerotic heart disease of native coronary artery without angina pectoris: Secondary | ICD-10-CM | POA: Diagnosis not present

## 2020-09-16 DIAGNOSIS — G250 Essential tremor: Secondary | ICD-10-CM | POA: Diagnosis not present

## 2020-09-16 DIAGNOSIS — Z48812 Encounter for surgical aftercare following surgery on the circulatory system: Secondary | ICD-10-CM | POA: Diagnosis not present

## 2020-09-16 DIAGNOSIS — Z952 Presence of prosthetic heart valve: Secondary | ICD-10-CM | POA: Diagnosis not present

## 2020-09-19 ENCOUNTER — Other Ambulatory Visit: Payer: Self-pay | Admitting: *Deleted

## 2020-09-19 DIAGNOSIS — Z951 Presence of aortocoronary bypass graft: Secondary | ICD-10-CM | POA: Diagnosis not present

## 2020-09-19 DIAGNOSIS — I5022 Chronic systolic (congestive) heart failure: Secondary | ICD-10-CM | POA: Diagnosis not present

## 2020-09-19 DIAGNOSIS — I2510 Atherosclerotic heart disease of native coronary artery without angina pectoris: Secondary | ICD-10-CM | POA: Diagnosis not present

## 2020-09-19 DIAGNOSIS — G250 Essential tremor: Secondary | ICD-10-CM | POA: Diagnosis not present

## 2020-09-19 DIAGNOSIS — N183 Chronic kidney disease, stage 3 unspecified: Secondary | ICD-10-CM | POA: Diagnosis not present

## 2020-09-19 DIAGNOSIS — I4891 Unspecified atrial fibrillation: Secondary | ICD-10-CM | POA: Diagnosis not present

## 2020-09-19 DIAGNOSIS — N312 Flaccid neuropathic bladder, not elsewhere classified: Secondary | ICD-10-CM | POA: Diagnosis not present

## 2020-09-19 DIAGNOSIS — I110 Hypertensive heart disease with heart failure: Secondary | ICD-10-CM | POA: Diagnosis not present

## 2020-09-19 DIAGNOSIS — Z952 Presence of prosthetic heart valve: Secondary | ICD-10-CM | POA: Diagnosis not present

## 2020-09-19 DIAGNOSIS — N139 Obstructive and reflux uropathy, unspecified: Secondary | ICD-10-CM | POA: Diagnosis not present

## 2020-09-19 DIAGNOSIS — I5032 Chronic diastolic (congestive) heart failure: Secondary | ICD-10-CM | POA: Diagnosis not present

## 2020-09-19 DIAGNOSIS — Z48812 Encounter for surgical aftercare following surgery on the circulatory system: Secondary | ICD-10-CM | POA: Diagnosis not present

## 2020-09-19 DIAGNOSIS — M1990 Unspecified osteoarthritis, unspecified site: Secondary | ICD-10-CM | POA: Diagnosis not present

## 2020-09-19 NOTE — Patient Outreach (Signed)
[name] [name]) Care Management  09/19/2020  [name] [name] [name] [name] 13-Dec-1927 [id number]   Outgoing call placed to member/wife, no answer, HIPAA complaint voice message left.  Will follow up within the next 3-4 business days.  [name], [place], MSN [place] [phone]

## 2020-09-22 DIAGNOSIS — N312 Flaccid neuropathic bladder, not elsewhere classified: Secondary | ICD-10-CM | POA: Diagnosis not present

## 2020-09-22 DIAGNOSIS — G479 Sleep disorder, unspecified: Secondary | ICD-10-CM | POA: Diagnosis not present

## 2020-09-22 DIAGNOSIS — Z48812 Encounter for surgical aftercare following surgery on the circulatory system: Secondary | ICD-10-CM | POA: Diagnosis not present

## 2020-09-22 DIAGNOSIS — R69 Illness, unspecified: Secondary | ICD-10-CM | POA: Diagnosis not present

## 2020-09-22 DIAGNOSIS — Z952 Presence of prosthetic heart valve: Secondary | ICD-10-CM | POA: Diagnosis not present

## 2020-09-22 DIAGNOSIS — I5032 Chronic diastolic (congestive) heart failure: Secondary | ICD-10-CM | POA: Diagnosis not present

## 2020-09-22 DIAGNOSIS — I2510 Atherosclerotic heart disease of native coronary artery without angina pectoris: Secondary | ICD-10-CM | POA: Diagnosis not present

## 2020-09-22 DIAGNOSIS — N139 Obstructive and reflux uropathy, unspecified: Secondary | ICD-10-CM | POA: Diagnosis not present

## 2020-09-22 DIAGNOSIS — G250 Essential tremor: Secondary | ICD-10-CM | POA: Diagnosis not present

## 2020-09-22 DIAGNOSIS — M1990 Unspecified osteoarthritis, unspecified site: Secondary | ICD-10-CM | POA: Diagnosis not present

## 2020-09-22 DIAGNOSIS — Z951 Presence of aortocoronary bypass graft: Secondary | ICD-10-CM | POA: Diagnosis not present

## 2020-09-22 DIAGNOSIS — I110 Hypertensive heart disease with heart failure: Secondary | ICD-10-CM | POA: Diagnosis not present

## 2020-09-23 DIAGNOSIS — Z952 Presence of prosthetic heart valve: Secondary | ICD-10-CM | POA: Diagnosis not present

## 2020-09-23 DIAGNOSIS — M1990 Unspecified osteoarthritis, unspecified site: Secondary | ICD-10-CM | POA: Diagnosis not present

## 2020-09-23 DIAGNOSIS — N312 Flaccid neuropathic bladder, not elsewhere classified: Secondary | ICD-10-CM | POA: Diagnosis not present

## 2020-09-23 DIAGNOSIS — I5032 Chronic diastolic (congestive) heart failure: Secondary | ICD-10-CM | POA: Diagnosis not present

## 2020-09-23 DIAGNOSIS — I2510 Atherosclerotic heart disease of native coronary artery without angina pectoris: Secondary | ICD-10-CM | POA: Diagnosis not present

## 2020-09-23 DIAGNOSIS — Z48812 Encounter for surgical aftercare following surgery on the circulatory system: Secondary | ICD-10-CM | POA: Diagnosis not present

## 2020-09-23 DIAGNOSIS — G250 Essential tremor: Secondary | ICD-10-CM | POA: Diagnosis not present

## 2020-09-23 DIAGNOSIS — I110 Hypertensive heart disease with heart failure: Secondary | ICD-10-CM | POA: Diagnosis not present

## 2020-09-23 DIAGNOSIS — Z951 Presence of aortocoronary bypass graft: Secondary | ICD-10-CM | POA: Diagnosis not present

## 2020-09-23 DIAGNOSIS — N139 Obstructive and reflux uropathy, unspecified: Secondary | ICD-10-CM | POA: Diagnosis not present

## 2020-09-23 NOTE — Telephone Encounter (Signed)
He should be taking twice a day. I would continue with current mg until we see how he does with twice a day.  Iyad Deroo Martinique MD, [facility]

## 2020-09-24 ENCOUNTER — Telehealth: Payer: Self-pay | Admitting: Cardiology

## 2020-09-24 DIAGNOSIS — I2510 Atherosclerotic heart disease of native coronary artery without angina pectoris: Secondary | ICD-10-CM | POA: Diagnosis not present

## 2020-09-24 DIAGNOSIS — I5032 Chronic diastolic (congestive) heart failure: Secondary | ICD-10-CM | POA: Diagnosis not present

## 2020-09-24 DIAGNOSIS — I110 Hypertensive heart disease with heart failure: Secondary | ICD-10-CM | POA: Diagnosis not present

## 2020-09-24 DIAGNOSIS — N312 Flaccid neuropathic bladder, not elsewhere classified: Secondary | ICD-10-CM | POA: Diagnosis not present

## 2020-09-24 DIAGNOSIS — N139 Obstructive and reflux uropathy, unspecified: Secondary | ICD-10-CM | POA: Diagnosis not present

## 2020-09-24 DIAGNOSIS — Z952 Presence of prosthetic heart valve: Secondary | ICD-10-CM | POA: Diagnosis not present

## 2020-09-24 DIAGNOSIS — Z48812 Encounter for surgical aftercare following surgery on the circulatory system: Secondary | ICD-10-CM | POA: Diagnosis not present

## 2020-09-24 DIAGNOSIS — G250 Essential tremor: Secondary | ICD-10-CM | POA: Diagnosis not present

## 2020-09-24 DIAGNOSIS — Z951 Presence of aortocoronary bypass graft: Secondary | ICD-10-CM | POA: Diagnosis not present

## 2020-09-24 DIAGNOSIS — M1990 Unspecified osteoarthritis, unspecified site: Secondary | ICD-10-CM | POA: Diagnosis not present

## 2020-09-24 MED ORDER — ENTRESTO 24-26 MG PO TABS: 1.0000 | ORAL_TABLET | Freq: Every day | ORAL | 11 refills | Status: DC

## 2020-09-24 NOTE — Telephone Encounter (Signed)
New Message:      Pt needs an authorization to [facility] to take the pt's oxygen away please. Please fax this to [phone] or [phone].

## 2020-09-25 ENCOUNTER — Other Ambulatory Visit: Payer: Self-pay | Admitting: *Deleted

## 2020-09-25 NOTE — Patient Outreach (Signed)
[facility]) Care Management  09/25/2020  [name] [name] Apr 24, 1928 [id number]   Outreach attempt #2, unsuccessful, HPAA compliant voice message left.  Will send outreach letter and follow up within the next 3-4 business days.  [name], [place], MSN [place] [phone]

## 2020-09-29 NOTE — Telephone Encounter (Signed)
Spoke to patient's wife.She stated husband no longer needs home O2.Stated he has not used in several months.Advised I will fax letter to [facility] to remove O2.

## 2020-09-29 NOTE — Telephone Encounter (Signed)
Letter faxed to [place] at fax # [phone] to remove patient's home O2.

## 2020-09-30 ENCOUNTER — Other Ambulatory Visit: Payer: Self-pay | Admitting: *Deleted

## 2020-09-30 ENCOUNTER — Ambulatory Visit: Payer: Self-pay | Admitting: *Deleted

## 2020-09-30 NOTE — Patient Outreach (Signed)
[place] [facility]) Care Management  09/30/2020  [name] [name] 1928-02-21 [id number]   Member scheduled for outreach today, wife left voice message stating they would not be available and request to reschedule date/time for call.  Member rescheduled for 4/7.  [name], [place], MSN [place] [phone]

## 2020-10-01 ENCOUNTER — Ambulatory Visit: Payer: Medicare HMO | Admitting: Surgery

## 2020-10-02 ENCOUNTER — Telehealth (HOSPITAL_COMMUNITY): Payer: Self-pay | Admitting: Cardiology

## 2020-10-02 ENCOUNTER — Other Ambulatory Visit: Payer: Self-pay | Admitting: *Deleted

## 2020-10-02 NOTE — Telephone Encounter (Signed)
Message from front office stating pt has called multiple times with questions regarding rehab  Returned call to assist further [facility]

## 2020-10-02 NOTE — Patient Outreach (Signed)
[place] [facility]) Care Management  10/02/2020  Wayburn Shaler [birth year] [id number]   Outreach attempt #3, successful to wife.  She report member has increased his mobility, using the cane more than the walker.  They are now able to remove the ramp from the front of the house as member has improved.  She inquires about the start of cardiac rehab as she was told there was a referral placed.  He has finished his home PT sessions but they are looking into renewing sessions until he is able to attend cardiac rehab.  She report she has tried to call but has not been successful.  This care manager placed call to cardiac rehab, notified that member will likely start in June, wife made aware.  She will update the home health PT and get update on renewal process.  She continue to monitor member's blood pressure, oxygen levels, and weight daily. State weight has been stable, blood pressure on the low side due to starting Entresto.  He will follow up with cardiac surgeon on 4/14, will have repeat Echo and be seen at heart failure clinic on 5/11.  Denies any urgent concerns, encouraged to contact this care manager with questions.  Agrees to follow up within the next month.  Goals Addressed            This Visit's Progress   . [facility] - Improve My Heart Health-Coronary Artery Disease   On track    Timeframe:  Long-Range Goal Priority:  High Start Date:        4/7  (date reset)            Expected End Date:       7/7       - be open to making changes - learn about small changes that will make a big difference    Why is this important?    Lifestyle changes are key to improving the blood flow to your heart. Think about the things you can change and set a goal to live healthy.   Remember, when the blood vessels to your heart start to get clogged you may not have any symptoms.   Over time, they can get worse.   Don't ignore the signs, like chest pain, and get help right away.     Notes:    Encouraged to contact MD with any potential complications  3/4 - Discussed medication changes with wife, encouraged to monitor BP, HR, and weights daily  4/7 - Discussed plan to have member participate in cardiac rehab    . [facility] - Make and Keep All Appointments   On track    Timeframe:  Short-Term Goal Priority:  Medium Start Date:         4/7 (reset)             Expected End Date:     5/7         - ask family or friend for a ride - call to cancel if needed - keep a calendar with prescription refill dates    Why is this important?    Part of staying healthy is seeing the doctor for follow-up care.   If you forget your appointments, there are some things you can do to stay on track.    Notes:   2/9 - Upcoming appointments reviewed with wife  2/25 - HF clinic visit reviewed with wife, reminded of upcoming appointments  3/4 - Reviewed PCP visit, discussed follow up.  Encouraged  to reschedule if needed  4/7 - Has visit with surgeon on 4/14, assessed need for transportation assistance       [name], [place], MSN [place] [phone]

## 2020-10-03 DIAGNOSIS — G250 Essential tremor: Secondary | ICD-10-CM | POA: Diagnosis not present

## 2020-10-03 DIAGNOSIS — G629 Polyneuropathy, unspecified: Secondary | ICD-10-CM | POA: Diagnosis not present

## 2020-10-03 DIAGNOSIS — I5032 Chronic diastolic (congestive) heart failure: Secondary | ICD-10-CM | POA: Diagnosis not present

## 2020-10-03 DIAGNOSIS — N139 Obstructive and reflux uropathy, unspecified: Secondary | ICD-10-CM | POA: Diagnosis not present

## 2020-10-03 DIAGNOSIS — N312 Flaccid neuropathic bladder, not elsewhere classified: Secondary | ICD-10-CM | POA: Diagnosis not present

## 2020-10-03 DIAGNOSIS — M1990 Unspecified osteoarthritis, unspecified site: Secondary | ICD-10-CM | POA: Diagnosis not present

## 2020-10-03 DIAGNOSIS — Z7901 Long term (current) use of anticoagulants: Secondary | ICD-10-CM | POA: Diagnosis not present

## 2020-10-03 DIAGNOSIS — I2510 Atherosclerotic heart disease of native coronary artery without angina pectoris: Secondary | ICD-10-CM | POA: Diagnosis not present

## 2020-10-03 DIAGNOSIS — I110 Hypertensive heart disease with heart failure: Secondary | ICD-10-CM | POA: Diagnosis not present

## 2020-10-03 DIAGNOSIS — E785 Hyperlipidemia, unspecified: Secondary | ICD-10-CM | POA: Diagnosis not present

## 2020-10-06 DIAGNOSIS — R339 Retention of urine, unspecified: Secondary | ICD-10-CM | POA: Diagnosis not present

## 2020-10-06 DIAGNOSIS — S9032XA Contusion of left foot, initial encounter: Secondary | ICD-10-CM | POA: Diagnosis not present

## 2020-10-07 DIAGNOSIS — Z7901 Long term (current) use of anticoagulants: Secondary | ICD-10-CM | POA: Diagnosis not present

## 2020-10-07 DIAGNOSIS — N139 Obstructive and reflux uropathy, unspecified: Secondary | ICD-10-CM | POA: Diagnosis not present

## 2020-10-07 DIAGNOSIS — G629 Polyneuropathy, unspecified: Secondary | ICD-10-CM | POA: Diagnosis not present

## 2020-10-07 DIAGNOSIS — I5032 Chronic diastolic (congestive) heart failure: Secondary | ICD-10-CM | POA: Diagnosis not present

## 2020-10-07 DIAGNOSIS — I2510 Atherosclerotic heart disease of native coronary artery without angina pectoris: Secondary | ICD-10-CM | POA: Diagnosis not present

## 2020-10-07 DIAGNOSIS — M1990 Unspecified osteoarthritis, unspecified site: Secondary | ICD-10-CM | POA: Diagnosis not present

## 2020-10-07 DIAGNOSIS — G250 Essential tremor: Secondary | ICD-10-CM | POA: Diagnosis not present

## 2020-10-07 DIAGNOSIS — N312 Flaccid neuropathic bladder, not elsewhere classified: Secondary | ICD-10-CM | POA: Diagnosis not present

## 2020-10-07 DIAGNOSIS — I110 Hypertensive heart disease with heart failure: Secondary | ICD-10-CM | POA: Diagnosis not present

## 2020-10-07 DIAGNOSIS — E785 Hyperlipidemia, unspecified: Secondary | ICD-10-CM | POA: Diagnosis not present

## 2020-10-08 ENCOUNTER — Ambulatory Visit: Payer: Medicare HMO | Admitting: Surgery

## 2020-10-08 ENCOUNTER — Other Ambulatory Visit: Payer: Self-pay | Admitting: Surgery

## 2020-10-08 DIAGNOSIS — Z951 Presence of aortocoronary bypass graft: Secondary | ICD-10-CM

## 2020-10-09 ENCOUNTER — Encounter: Payer: Self-pay | Admitting: Surgery

## 2020-10-09 ENCOUNTER — Ambulatory Visit
Admission: RE | Admit: 2020-10-09 | Discharge: 2020-10-09 | Disposition: A | Discharge status: Home / Self Care | Payer: Medicare HMO | Source: Ambulatory Visit | Attending: Surgery | Admitting: Surgery

## 2020-10-09 ENCOUNTER — Ambulatory Visit: Payer: Medicare HMO | Admitting: Surgery

## 2020-10-09 ENCOUNTER — Other Ambulatory Visit: Payer: Self-pay

## 2020-10-09 VITALS — BP 96/65 | HR 74 | Resp 20 | Ht 69.0 in

## 2020-10-09 DIAGNOSIS — Z951 Presence of aortocoronary bypass graft: Secondary | ICD-10-CM | POA: Diagnosis not present

## 2020-10-09 DIAGNOSIS — J9811 Atelectasis: Secondary | ICD-10-CM | POA: Diagnosis not present

## 2020-10-09 DIAGNOSIS — I517 Cardiomegaly: Secondary | ICD-10-CM | POA: Diagnosis not present

## 2020-10-09 DIAGNOSIS — Z952 Presence of prosthetic heart valve: Secondary | ICD-10-CM | POA: Diagnosis not present

## 2020-10-09 NOTE — Progress Notes (Signed)
HPI:  The patient returns today for postoperative follow-up status post coronary artery bypass graft surgery x4 and aortic valve replacement with a 29 mm Edwards pericardial valve on 05/23/2020.  His postoperative course was long and complicated due to advanced age and debilitation, chronic systolic and diastolic congestive heart failure with marked fluid retention, stage III chronic kidney disease, malnutrition, and generalized weakness.  He was seen by the advanced heart failure team postoperatively and required prolonged inotrope therapy.  He was eventually discharged to a skilled nursing facility for further recovery and rehabilitation.  He is now back at home with his wife.  I last saw him on 07/30/2020 at which time he was still fairly weak but slowly improving.  He is here today with his wife.  Since I last saw him he has continued to improve.  He is walking with a cane.  He is eating well but has not put on any weight.  He denies any chest pain or shortness of breath.  Current Outpatient Medications  Medication Sig Dispense Refill  . apixaban (ELIQUIS) 5 MG TABS tablet Take 1 tablet (5 mg total) by mouth 2 (two) times daily. 180 tablet 1  . Ascorbic Acid (VITAMIN C) 100 MG tablet Take 100 mg by mouth daily.    Marland Kitchen aspirin 81 MG tablet Take 81 mg by mouth at bedtime.     . Cholecalciferol (VITAMIN D) 50 MCG (2000 UT) tablet Take 1 tablet by mouth daily.    Marland Kitchen docusate sodium (COLACE) 100 MG capsule Take 1 capsule (100 mg total) by mouth 2 (two) times daily. 10 capsule 0  . dutasteride (AVODART) 0.5 MG capsule Take 0.5 mg by mouth daily.    . eszopiclone (LUNESTA) 1 MG TABS tablet Take 1-2 mg by mouth at bedtime.    . feeding supplement (ENSURE ENLIVE / ENSURE PLUS) LIQD Take 237 mLs by mouth 4 (four) times daily. 237 mL 12  . ferrous [id number] C-folic acid (TRINSICON / FOLTRIN) capsule Take 1 capsule by mouth 2 (two) times daily after a meal.    . furosemide (LASIX) 40 MG tablet  Take 1 tablet (40 mg total) by mouth daily. 30 tablet 11  . melatonin 5 MG TABS Take 1 tablet (5 mg total) by mouth at bedtime as needed (sleep).  0  . Multiple Vitamins-Minerals (CENTRUM SILVER PO) Take 1 tablet by mouth daily.     . ondansetron (ZOFRAN) 4 MG tablet Take 1 tablet (4 mg total) by mouth every 8 (eight) hours as needed for nausea or vomiting. 20 tablet 0  . sacubitril-valsartan (ENTRESTO) 24-26 MG Take 1 tablet by mouth daily. 60 tablet 11  . sertraline (ZOLOFT) 50 MG tablet Take 1 tablet by mouth daily.    Marland Kitchen sulfaSALAzine (AZULFIDINE) 500 MG tablet Take 2,000 mg by mouth daily.    . tamsulosin (FLOMAX) 0.4 MG CAPS Take 0.4 mg by mouth 2 (two) times daily.     . vitamin B-12 (CYANOCOBALAMIN) 100 MCG tablet Take 1 tablet by mouth daily.     No current facility-administered medications for this visit.     Physical Exam: BP 96/65 (BP Location: Right Arm, Patient Position: Sitting)   Pulse 74   Resp 20   Ht 5\' 9"  (1.753 m)   SpO2 96% Comment: RA  BMI 24.96 kg/m  He looks stronger than he did when I last saw him. Cardiac exam shows a regular rate and rhythm with normal heart sounds.  There is no  murmur. Lungs are clear. The chest incision is well-healed and sternum is stable. His leg incisions are well-healed and there is no lower extremity edema.  Diagnostic Tests:  CLINICAL DATA:  Status post coronary bypass graft.  EXAM: CHEST - 2 VIEW  COMPARISON:  July 30, 2020.  FINDINGS: Stable cardiomegaly. No pneumothorax or pleural effusion is noted. Left lung is clear. Minimal right basilar subsegmental atelectasis is noted. Status post aortic valve replacement. Status post right total shoulder arthroplasty.  IMPRESSION: Minimal right basilar subsegmental atelectasis.   Electronically Signed   By: Marijo Conception M.D.   On: 10/09/2020 13:12  Impression:  Overall I think he continues to make progress following his surgery.  I would expect him to  continue to improve with physical therapy and attention to nutrition.  Plan:  He will continue to follow-up with his PCP and Dr. Martinique.  He will return to see me if he has any problems with his incisions.   Gaye Pollack, MD [facility] [phone]

## 2020-10-10 DIAGNOSIS — G629 Polyneuropathy, unspecified: Secondary | ICD-10-CM | POA: Diagnosis not present

## 2020-10-10 DIAGNOSIS — N139 Obstructive and reflux uropathy, unspecified: Secondary | ICD-10-CM | POA: Diagnosis not present

## 2020-10-10 DIAGNOSIS — I5032 Chronic diastolic (congestive) heart failure: Secondary | ICD-10-CM | POA: Diagnosis not present

## 2020-10-10 DIAGNOSIS — N312 Flaccid neuropathic bladder, not elsewhere classified: Secondary | ICD-10-CM | POA: Diagnosis not present

## 2020-10-10 DIAGNOSIS — G250 Essential tremor: Secondary | ICD-10-CM | POA: Diagnosis not present

## 2020-10-10 DIAGNOSIS — I110 Hypertensive heart disease with heart failure: Secondary | ICD-10-CM | POA: Diagnosis not present

## 2020-10-10 DIAGNOSIS — E785 Hyperlipidemia, unspecified: Secondary | ICD-10-CM | POA: Diagnosis not present

## 2020-10-10 DIAGNOSIS — I2510 Atherosclerotic heart disease of native coronary artery without angina pectoris: Secondary | ICD-10-CM | POA: Diagnosis not present

## 2020-10-10 DIAGNOSIS — M1990 Unspecified osteoarthritis, unspecified site: Secondary | ICD-10-CM | POA: Diagnosis not present

## 2020-10-10 DIAGNOSIS — Z7901 Long term (current) use of anticoagulants: Secondary | ICD-10-CM | POA: Diagnosis not present

## 2020-10-14 DIAGNOSIS — G629 Polyneuropathy, unspecified: Secondary | ICD-10-CM | POA: Diagnosis not present

## 2020-10-14 DIAGNOSIS — I2510 Atherosclerotic heart disease of native coronary artery without angina pectoris: Secondary | ICD-10-CM | POA: Diagnosis not present

## 2020-10-14 DIAGNOSIS — I110 Hypertensive heart disease with heart failure: Secondary | ICD-10-CM | POA: Diagnosis not present

## 2020-10-14 DIAGNOSIS — I5032 Chronic diastolic (congestive) heart failure: Secondary | ICD-10-CM | POA: Diagnosis not present

## 2020-10-14 DIAGNOSIS — G250 Essential tremor: Secondary | ICD-10-CM | POA: Diagnosis not present

## 2020-10-14 DIAGNOSIS — E785 Hyperlipidemia, unspecified: Secondary | ICD-10-CM | POA: Diagnosis not present

## 2020-10-14 DIAGNOSIS — N312 Flaccid neuropathic bladder, not elsewhere classified: Secondary | ICD-10-CM | POA: Diagnosis not present

## 2020-10-14 DIAGNOSIS — Z7901 Long term (current) use of anticoagulants: Secondary | ICD-10-CM | POA: Diagnosis not present

## 2020-10-14 DIAGNOSIS — M1990 Unspecified osteoarthritis, unspecified site: Secondary | ICD-10-CM | POA: Diagnosis not present

## 2020-10-14 DIAGNOSIS — N139 Obstructive and reflux uropathy, unspecified: Secondary | ICD-10-CM | POA: Diagnosis not present

## 2020-10-15 DIAGNOSIS — N139 Obstructive and reflux uropathy, unspecified: Secondary | ICD-10-CM | POA: Diagnosis not present

## 2020-10-15 DIAGNOSIS — N312 Flaccid neuropathic bladder, not elsewhere classified: Secondary | ICD-10-CM | POA: Diagnosis not present

## 2020-10-15 DIAGNOSIS — I2510 Atherosclerotic heart disease of native coronary artery without angina pectoris: Secondary | ICD-10-CM | POA: Diagnosis not present

## 2020-10-15 DIAGNOSIS — M1990 Unspecified osteoarthritis, unspecified site: Secondary | ICD-10-CM | POA: Diagnosis not present

## 2020-10-15 DIAGNOSIS — Z7901 Long term (current) use of anticoagulants: Secondary | ICD-10-CM | POA: Diagnosis not present

## 2020-10-15 DIAGNOSIS — G629 Polyneuropathy, unspecified: Secondary | ICD-10-CM | POA: Diagnosis not present

## 2020-10-15 DIAGNOSIS — E785 Hyperlipidemia, unspecified: Secondary | ICD-10-CM | POA: Diagnosis not present

## 2020-10-15 DIAGNOSIS — G250 Essential tremor: Secondary | ICD-10-CM | POA: Diagnosis not present

## 2020-10-15 DIAGNOSIS — I110 Hypertensive heart disease with heart failure: Secondary | ICD-10-CM | POA: Diagnosis not present

## 2020-10-15 DIAGNOSIS — I5032 Chronic diastolic (congestive) heart failure: Secondary | ICD-10-CM | POA: Diagnosis not present

## 2020-10-20 DIAGNOSIS — I2510 Atherosclerotic heart disease of native coronary artery without angina pectoris: Secondary | ICD-10-CM | POA: Diagnosis not present

## 2020-10-20 DIAGNOSIS — I5022 Chronic systolic (congestive) heart failure: Secondary | ICD-10-CM | POA: Diagnosis not present

## 2020-10-20 DIAGNOSIS — I4891 Unspecified atrial fibrillation: Secondary | ICD-10-CM | POA: Diagnosis not present

## 2020-10-20 DIAGNOSIS — N183 Chronic kidney disease, stage 3 unspecified: Secondary | ICD-10-CM | POA: Diagnosis not present

## 2020-10-22 ENCOUNTER — Encounter (HOSPITAL_COMMUNITY): Payer: Self-pay

## 2020-10-22 DIAGNOSIS — G250 Essential tremor: Secondary | ICD-10-CM | POA: Diagnosis not present

## 2020-10-22 DIAGNOSIS — N312 Flaccid neuropathic bladder, not elsewhere classified: Secondary | ICD-10-CM | POA: Diagnosis not present

## 2020-10-22 DIAGNOSIS — M1990 Unspecified osteoarthritis, unspecified site: Secondary | ICD-10-CM | POA: Diagnosis not present

## 2020-10-22 DIAGNOSIS — N139 Obstructive and reflux uropathy, unspecified: Secondary | ICD-10-CM | POA: Diagnosis not present

## 2020-10-22 DIAGNOSIS — E785 Hyperlipidemia, unspecified: Secondary | ICD-10-CM | POA: Diagnosis not present

## 2020-10-22 DIAGNOSIS — I2510 Atherosclerotic heart disease of native coronary artery without angina pectoris: Secondary | ICD-10-CM | POA: Diagnosis not present

## 2020-10-22 DIAGNOSIS — I5032 Chronic diastolic (congestive) heart failure: Secondary | ICD-10-CM | POA: Diagnosis not present

## 2020-10-22 DIAGNOSIS — I110 Hypertensive heart disease with heart failure: Secondary | ICD-10-CM | POA: Diagnosis not present

## 2020-10-22 DIAGNOSIS — G629 Polyneuropathy, unspecified: Secondary | ICD-10-CM | POA: Diagnosis not present

## 2020-10-22 DIAGNOSIS — Z7901 Long term (current) use of anticoagulants: Secondary | ICD-10-CM | POA: Diagnosis not present

## 2020-10-28 DIAGNOSIS — I5032 Chronic diastolic (congestive) heart failure: Secondary | ICD-10-CM | POA: Diagnosis not present

## 2020-10-28 DIAGNOSIS — I110 Hypertensive heart disease with heart failure: Secondary | ICD-10-CM | POA: Diagnosis not present

## 2020-10-28 DIAGNOSIS — N312 Flaccid neuropathic bladder, not elsewhere classified: Secondary | ICD-10-CM | POA: Diagnosis not present

## 2020-10-28 DIAGNOSIS — E785 Hyperlipidemia, unspecified: Secondary | ICD-10-CM | POA: Diagnosis not present

## 2020-10-28 DIAGNOSIS — G629 Polyneuropathy, unspecified: Secondary | ICD-10-CM | POA: Diagnosis not present

## 2020-10-28 DIAGNOSIS — R339 Retention of urine, unspecified: Secondary | ICD-10-CM | POA: Diagnosis not present

## 2020-10-28 DIAGNOSIS — I2510 Atherosclerotic heart disease of native coronary artery without angina pectoris: Secondary | ICD-10-CM | POA: Diagnosis not present

## 2020-10-28 DIAGNOSIS — N139 Obstructive and reflux uropathy, unspecified: Secondary | ICD-10-CM | POA: Diagnosis not present

## 2020-10-28 DIAGNOSIS — M1990 Unspecified osteoarthritis, unspecified site: Secondary | ICD-10-CM | POA: Diagnosis not present

## 2020-10-28 DIAGNOSIS — Z7901 Long term (current) use of anticoagulants: Secondary | ICD-10-CM | POA: Diagnosis not present

## 2020-10-28 DIAGNOSIS — G250 Essential tremor: Secondary | ICD-10-CM | POA: Diagnosis not present

## 2020-10-30 ENCOUNTER — Other Ambulatory Visit: Payer: Self-pay | Admitting: *Deleted

## 2020-10-30 NOTE — Patient Outreach (Signed)
[place]) Care Management  10/30/2020  [name] [name] April 07, 1928 [id number]   Outgoing call placed to member/wife, no answer, HIPAA compliant voice message left.  Will follow up within the next 3-4 business days.  [name], [place], MSN [place] [phone]

## 2020-11-03 ENCOUNTER — Telehealth (HOSPITAL_COMMUNITY): Payer: Self-pay | Admitting: Student-PharmD

## 2020-11-04 ENCOUNTER — Other Ambulatory Visit: Payer: Self-pay | Admitting: *Deleted

## 2020-11-04 NOTE — Progress Notes (Signed)
Advanced Heart Failure Clinic Note   PCP: Cari Caraway, MD PCP-Cardiologist: Peter Martinique, MD  HF Cardiologist: Dr. Haroldine Laws  HPI: Mr. Kissoon is a 87 y/o male, previously very active despite age before recent illness. Had been working 20-30 hr/week for [employer] and [employer].    Admitted in 11/21 with progressive decline in EF to 30-35% w/ global HK. RV systolic function mildly reduced. Also noted to have mod-severe AS.  Had subsequent R/LHC showing severe 3V CAD. C   Underwent CABG x 4 (LIMA-LAD, sequential SVG-OM1 and OM2, SVG- PDA) + tissue AVR by Dr. Cyndia Bent on 05/23/20. Post operative course c/b atrial fibrillation treated w/ amiodarone + difficulty weaning inotropes, fluid overload and hypotension. AHF consult to assist with management. He was discharged to [facility] 06/26/20. His discharge weight was 168 lbs.  Saw Dr Martinique 08/28/20 and entresto was started.  Saw Dr Rayann Heman on 09/10/20 and plan was for [facility] taper.    Since last visit with me feels like strength has improved has continued to recover from his CABG.  Sleeping better, trying to stay active.  Denies shortness of breath, orthopnea/PND.  BP has been very low, takes lasix 40mg  every other day. Is having some issues with dizziness with standing and low energy. Starts Cardiac Rehab next week.  Weighs around 162lbs at home is 167lbs here.     ROS: All systems reviewed and negative except as per HPI.   Past Medical History:  Diagnosis Date  . Aortic stenosis   . Arthritis   . CAD (coronary artery disease)   . CHF (congestive heart failure) ([place])   . Colitis   . Edema   . Essential hypertension    no med now  . Essential tremor   . Hyperlipidemia   . Hypotonic bladder    L4-L5  . Obstructive uropathy   . Peripheral neuropathy    Elevated by Dr. love 2010  . Prostate cancer [facility]) 2005    Current Outpatient Medications  Medication Sig Dispense Refill  . apixaban (ELIQUIS) 5 MG TABS tablet Take 1  tablet (5 mg total) by mouth 2 (two) times daily. 180 tablet 1  . Ascorbic Acid (VITAMIN C) 100 MG tablet Take 100 mg by mouth daily.    Marland Kitchen aspirin 81 MG tablet Take 81 mg by mouth at bedtime.     . Cholecalciferol (VITAMIN D) 50 MCG (2000 UT) tablet Take 1 tablet by mouth daily.    Marland Kitchen docusate sodium (COLACE) 100 MG capsule Take 100 mg by mouth daily as needed for mild constipation.    . dutasteride (AVODART) 0.5 MG capsule Take 0.5 mg by mouth daily.    . eszopiclone (LUNESTA) 1 MG TABS tablet Take 1-2 mg by mouth at bedtime.    . feeding supplement, ENSURE COMPLETE, (ENSURE COMPLETE) LIQD Take 237 mLs by mouth 2 (two) times daily between meals.    . ferrous [id number] C-folic acid (TRINSICON / FOLTRIN) capsule Take 1 capsule by mouth 2 (two) times daily after a meal.    . furosemide (LASIX) 40 MG tablet Take 40 mg by mouth every other day.    . melatonin 5 MG TABS Take 1 tablet (5 mg total) by mouth at bedtime as needed (sleep).  0  . Multiple Vitamins-Minerals (CENTRUM SILVER PO) Take 1 tablet by mouth daily.     . ondansetron (ZOFRAN) 4 MG tablet Take 1 tablet (4 mg total) by mouth every 8 (eight) hours as needed for nausea or vomiting.  20 tablet 0  . sacubitril-valsartan (ENTRESTO) 24-26 MG Take 1 tablet by mouth daily. 60 tablet 11  . sertraline (ZOLOFT) 50 MG tablet Take 1 tablet by mouth daily.    Marland Kitchen sulfaSALAzine (AZULFIDINE) 500 MG tablet Take 2,000 mg by mouth daily.    . tamsulosin (FLOMAX) 0.4 MG CAPS Take 0.4 mg by mouth 2 (two) times daily.     . vitamin B-12 (CYANOCOBALAMIN) 100 MCG tablet Take 1 tablet by mouth daily.     No current facility-administered medications for this encounter.   Allergies  Allergen Reactions  . Simvastatin Other (See Comments)    "caused neuropathy pain"    Social History   Socioeconomic History  . Marital status: Married    Spouse name: Not on file  . Number of children: Not on file  . Years of education: Not on file  . Highest  education level: Not on file  Occupational History  . Not on file  Tobacco Use  . Smoking status: Never Smoker  . Smokeless tobacco: Never Used  Vaping Use  . Vaping Use: Never used  Substance and Sexual Activity  . Alcohol use: Yes    Alcohol/week: 1.0 standard drink    Types: 1 Glasses of wine per week    Comment: occ  . Drug use: No  . Sexual activity: Yes  Other Topics Concern  . Not on file  Social History Narrative  . Not on file   Social Determinants of Health   Financial Resource Strain: Not on file  Food Insecurity: No Food Insecurity  . Worried About Charity fundraiser in the Last Year: Never true  . Ran Out of Food in the Last Year: Never true  Transportation Needs: No Transportation Needs  . Lack of Transportation (Medical): No  . Lack of Transportation (Non-Medical): No  Physical Activity: Not on file  Stress: Not on file  Social Connections: Not on file  Intimate Partner Violence: Not on file   Family History  Problem Relation Age of Onset  . Hypertension Mother   . Heart failure Mother   . Diabetes Mother   . Heart attack Brother   . Heart attack Maternal Aunt    Vitals:   11/05/20 1517  BP: 90/60  Pulse: 76  SpO2: 96%  Weight: 75.8 kg (167 lb 3.2 oz)    Wt Readings from Last 3 Encounters:  11/05/20 75.8 kg (167 lb 3.2 oz)  09/15/20 76.7 kg (169 lb)  09/10/20 75.3 kg (166 lb)   PHYSICAL EXAM: General:  Walked in the clinic with a walker. No resp difficulty HEENT: normal Neck: supple. no JVD. Carotids 2+ bilat; no bruits. No lymphadenopathy or thryomegaly appreciated. Cor: PMI nondisplaced. Regular rate & rhythm. No rubs, gallops or murmurs. Lungs: clear Abdomen: soft, nontender, nondistended. No hepatosplenomegaly. No bruits or masses. Good bowel sounds. Extremities: no cyanosis, clubbing, rash, R and LLE trace-1+ edema Neuro: alert & orientedx3, cranial nerves grossly intact. moves all 4 extremities w/o difficulty. Affect  pleasant  ASSESSMENT & PLAN:  1. Severe 3V CAD: - s/p CABGx 4 (LIMA-LAD, sequential SVG-OM1 and OM2, SVG- PDA) - Continue ASA 81 mg daily. - No ? blocker w/ post-cardiotomy shock and low BP. - Continue statin at next visit.    2. Aortic Stenosis, s/p tissue AVR (05/23/20) - Severe low flow low gradient AS. - AV prosthesis ok on echo. - Stable.   3.Chronic Biventricular Heart Failure:  - Echo in 2014 showed normal LVEF 55-60% w/  G1DD.  - Echo 10/21 and EF 30-35% w/ global HK. RV systolic function mildly reduced. - LHC this admit w/ severe 3V CAD>>ICM. Now s/p CABG. Also w/ severe AS, possibly contributing to LV dysfunction, now s/p SAVR.  - Post-op echo 12/1 EF 30-35%, RV mildly reduced. AV prothesis ok. - Portable echo (1/22): EF 30-35%  - 11/05/2020 ECHO with EF 35-40%, mild-mod MR, Moderately reduced RV function Personally reviewed - NYHA II-III. Volume status stable.  - Continue lasix 40 mg about every other day. Doing well titrating it themselves - SBP low today, having some dizziness on standing will switch entresto 24/26 to losartan 25mg  - Check BMET today - Off digoxin with heart block.  - No beta blocker with AV block. - Not good SGLT2i candidate with self caths     4. Atrial Fibrillation, post op.   - In NSR  - Amio stopped - Continue Eliquis 5 mg bid.   5. CKD IIIa - Creatinine 1.2 recent check .  - Check BMET today.   6. Urinary Retention - self caths 3x daily      Katherine Roan MD 3:30 PM

## 2020-11-04 NOTE — Patient Outreach (Signed)
[place] [facility]) Care Management  11/04/2020  Luqman Perrelli Mar 01, 1928 [id number]   Outreach attempt #2, unsuccessful, HIPAA compliant voice message left.  Will send outreach letter and follow up within the next 3-4 business days.  [name], [place], MSN [place] [phone]

## 2020-11-05 ENCOUNTER — Other Ambulatory Visit: Payer: Self-pay

## 2020-11-05 ENCOUNTER — Encounter (HOSPITAL_COMMUNITY): Payer: Self-pay | Admitting: Internal Medicine

## 2020-11-05 ENCOUNTER — Ambulatory Visit (HOSPITAL_COMMUNITY)
Admission: RE | Admit: 2020-11-05 | Discharge: 2020-11-05 | Disposition: A | Discharge status: Home / Self Care | Payer: Medicare HMO | Source: Ambulatory Visit | Attending: Internal Medicine | Admitting: Internal Medicine

## 2020-11-05 ENCOUNTER — Ambulatory Visit (HOSPITAL_BASED_OUTPATIENT_CLINIC_OR_DEPARTMENT_OTHER)
Admission: RE | Admit: 2020-11-05 | Discharge: 2020-11-05 | Disposition: A | Discharge status: Home / Self Care | Payer: Medicare HMO | Source: Ambulatory Visit | Attending: Internal Medicine | Admitting: Internal Medicine

## 2020-11-05 VITALS — BP 90/60 | HR 76 | Wt 167.2 lb

## 2020-11-05 DIAGNOSIS — I2510 Atherosclerotic heart disease of native coronary artery without angina pectoris: Secondary | ICD-10-CM | POA: Diagnosis not present

## 2020-11-05 DIAGNOSIS — I5042 Chronic combined systolic (congestive) and diastolic (congestive) heart failure: Secondary | ICD-10-CM

## 2020-11-05 DIAGNOSIS — I350 Nonrheumatic aortic (valve) stenosis: Secondary | ICD-10-CM | POA: Diagnosis not present

## 2020-11-05 DIAGNOSIS — I081 Rheumatic disorders of both mitral and tricuspid valves: Secondary | ICD-10-CM | POA: Insufficient documentation

## 2020-11-05 DIAGNOSIS — E785 Hyperlipidemia, unspecified: Secondary | ICD-10-CM | POA: Insufficient documentation

## 2020-11-05 DIAGNOSIS — I130 Hypertensive heart and chronic kidney disease with heart failure and stage 1 through stage 4 chronic kidney disease, or unspecified chronic kidney disease: Secondary | ICD-10-CM | POA: Insufficient documentation

## 2020-11-05 DIAGNOSIS — N1831 Chronic kidney disease, stage 3a: Secondary | ICD-10-CM | POA: Diagnosis not present

## 2020-11-05 HISTORY — DX: Heart failure, unspecified: I50.9

## 2020-11-05 LAB — BASIC METABOLIC PANEL
Anion gap: 9 (ref 5–15)
BUN: 38 mg/dL — ABNORMAL HIGH (ref 8–23)
CO2: 24 mmol/L (ref 22–32)
Calcium: 9 mg/dL (ref 8.9–10.3)
Chloride: 102 mmol/L (ref 98–111)
Creatinine, Ser: 0.99 mg/dL (ref 0.61–1.24)
GFR, Estimated: >60 mL/min (ref >60)
Glucose, Bld: 102 mg/dL — ABNORMAL HIGH (ref 70–99)
Potassium: 4.7 mmol/L (ref 3.5–5.1)
Sodium: 135 mmol/L (ref 135–145)

## 2020-11-05 LAB — CBC
HCT: 36.7 % — ABNORMAL LOW (ref 39.0–52.0)
Hemoglobin: 12.2 g/dL — ABNORMAL LOW (ref 13.0–17.0)
MCH: 29.5 pg (ref 26.0–34.0)
MCHC: 33.2 g/dL (ref 30.0–36.0)
MCV: 88.6 fL (ref 80.0–100.0)
Platelets: 143 10*3/uL — ABNORMAL LOW (ref 150–400)
RBC: 4.14 MIL/uL — ABNORMAL LOW (ref 4.22–5.81)
RDW: 19.2 % — ABNORMAL HIGH (ref 11.5–15.5)
WBC: 6.1 10*3/uL (ref 4.0–10.5)
nRBC: 0 % (ref 0.0–0.2)

## 2020-11-05 LAB — ECHOCARDIOGRAM COMPLETE
AR max vel: 1.38 cm2
AV Area VTI: 1.38 cm2
AV Area mean vel: 1.36 cm2
AV Mean grad: 10.8 mmHg
AV Peak grad: 18.7 mmHg
Ao pk vel: 2.16 m/s
Area-P 1/2: 3.85 cm2
P 1/2 time: 799 msec
S' Lateral: 3.6 cm

## 2020-11-05 LAB — BRAIN NATRIURETIC PEPTIDE: B Natriuretic Peptide: 757 pg/mL — ABNORMAL HIGH (ref 0.0–100.0)

## 2020-11-05 MED ORDER — LOSARTAN POTASSIUM 25 MG PO TABS: 25.0000 mg | ORAL_TABLET | Freq: Every day | ORAL | 3 refills | Status: DC

## 2020-11-05 NOTE — Patient Instructions (Signed)
Labs done today. We will contact you only if your labs are abnormal.  STOP taking Entresto  START Losartan 25mg  (1 tablet) by mouth daily at bedtime.  No other medication changes were made. Please continue all current medications as prescribed.  Your physician recommends that you schedule a follow-up appointment in: 6 months. Please contact our office in October to schedule a November appointment  If you have any questions or concerns before your next appointment please send [place] a message through [place] or call our office at [phone].    TO LEAVE A MESSAGE FOR THE NURSE SELECT OPTION 2, PLEASE LEAVE A MESSAGE INCLUDING: . YOUR NAME . DATE OF BIRTH . CALL BACK NUMBER . REASON FOR CALL**this is important as we prioritize the call backs  YOU WILL RECEIVE A CALL BACK THE SAME DAY AS LONG AS YOU CALL BEFORE 4:00 PM   Do the following things EVERYDAY: 1) Weigh yourself in the morning before breakfast. Write it down and keep it in a log. 2) Take your medicines as prescribed 3) Eat low salt foods--Limit salt (sodium) to 2000 mg per day.  4) Stay as active as you can everyday 5) Limit all fluids for the day to less than 2 liters   At the [facility], you and your health needs are our priority. As part of our continuing mission to provide you with exceptional heart care, we have created designated Provider Care Teams. These Care Teams include your primary Cardiologist (physician) and Advanced Practice Providers (APPs- Physician Assistants and Nurse Practitioners) who all work together to provide you with the care you need, when you need it.   You may see any of the following providers on your designated Care Team at your next follow up: Marland Kitchen Dr Glori Bickers . Dr Loralie Champagne . Darrick Grinder, NP . Lyda Jester, PA . Audry Riles, PharmD   Please be sure to bring in all your medications bottles to every appointment.

## 2020-11-05 NOTE — Addendum Note (Signed)
Encounter addended by: Jolaine Artist, MD on: 11/05/2020 4:36 PM  Actions taken: Level of Service modified, Visit diagnoses modified

## 2020-11-05 NOTE — Progress Notes (Signed)
  Echocardiogram 2D Echocardiogram has been performed.  Aiyah Scarpelli G Carmelo Reidel 11/05/2020, 3:06 PM

## 2020-11-06 NOTE — Telephone Encounter (Signed)
Cardiac Rehab - Pharmacy Resident Documentation   Patient unable to be reached after three call attempts. Please complete allergy verification and medication review during patient's cardiac rehab appointment.    Rebbeca Paul, PharmD PGY1 Pharmacy Resident 11/06/2020 4:17 PM  Please check [email or web address] for unit-specific pharmacy phone numbers.

## 2020-11-07 ENCOUNTER — Other Ambulatory Visit: Payer: Self-pay | Admitting: *Deleted

## 2020-11-07 NOTE — Patient Instructions (Signed)
Hypotension As your heart beats, it forces blood through your body. Hypotension, commonly called low blood pressure, is when the force of blood pumping through your arteries is too weak. Arteries are blood vessels that carry blood from the heart throughout the body. Depending on the cause and severity, hypotension may be harmless (benign) or may cause serious problems (be critical). When blood pressure is too low, you may not get enough blood to your brain or to the rest of your organs. This can cause weakness, light-headedness, rapid heartbeat, and fainting. What are the causes? This condition may be caused by:  Blood loss.  Loss of body fluids (dehydration).  Heart problems.  Hormone (endocrine) problems.  Pregnancy.  Severe infection.  Lack of certain nutrients.  Severe allergic reactions (anaphylaxis).  Certain medicines, such as blood pressure medicine or medicines that make the body lose excess fluids (diuretics). Sometimes, hypotension may be caused by not taking medicine as directed, such as taking too much of a certain medicine. What increases the risk? The following factors may make you more likely to develop this condition:  Age. Risk increases as you get older.  Conditions that affect the heart or the central nervous system.  Taking certain medicines, such as blood pressure medicine or diuretics.  Being pregnant. What are the signs or symptoms? Common symptoms of this condition include:  Weakness.  Light-headedness.  Dizziness.  Blurred vision.  Fatigue.  Rapid heartbeat.  Fainting, in severe cases. How is this diagnosed? This condition is diagnosed based on:  Your medical history.  Your symptoms.  Your blood pressure measurement. Your health care provider will check your blood pressure when you are: ? Lying down. ? Sitting. ? Standing. A blood pressure reading is recorded as two numbers, such as "120 over 80" (or 120/80). The first ("top")  number is called the systolic pressure. It is a measure of the pressure in your arteries as your heart beats. The second ("bottom") number is called the diastolic pressure. It is a measure of the pressure in your arteries when your heart relaxes between beats. Blood pressure is measured in a unit called mm Hg. Healthy blood pressure for most adults is 120/80. If your blood pressure is below 90/60, you may be diagnosed with hypotension. Other information or tests that may be used to diagnose hypotension include:  Your other vital signs, such as your heart rate and temperature.  Blood tests.  Tilt table test. For this test, you will be safely secured to a table that moves you from a lying position to an upright position. Your heart rhythm and blood pressure will be monitored during the test. How is this treated? Treatment for this condition may include:  Changing your diet. This may involve eating more salt (sodium) or drinking more water.  Taking medicines to raise your blood pressure.  Changing the dosage of certain medicines you are taking that might be lowering your blood pressure.  Wearing compression stockings. These stockings help to prevent blood clots and reduce swelling in your legs. In some cases, you may need to go to the hospital for:  Fluid replacement. This means you will receive fluids through an IV.  Blood replacement. This means you will receive donated blood through an IV (transfusion).  Treating an infection or heart problems, if this applies.  Monitoring. You may need to be monitored while medicines that you are taking wear off. Follow these instructions at home: Eating and drinking  Drink enough fluid to keep your urine  pale yellow.  Eat a healthy diet, and follow instructions from your health care provider about eating or drinking restrictions. A healthy diet includes: ? Fresh fruits and vegetables. ? Whole grains. ? Lean meats. ? Low-fat dairy  products.  Eat extra salt only as directed. Do not add extra salt to your diet unless your health care provider told you to do that.  Eat frequent, small meals.  Avoid standing up suddenly after eating.   Medicines  Take over-the-counter and prescription medicines only as told by your health care provider. ? Follow instructions from your health care provider about changing the dosage of your current medicines, if this applies. ? Do not stop or adjust any of your medicines on your own. General instructions  Wear compression stockings as told by your health care provider.  Get up slowly from lying down or sitting positions. This gives your blood pressure a chance to adjust.  Avoid hot showers and excessive heat as directed by your health care provider.  Return to your normal activities as told by your health care provider. Ask your health care provider what activities are safe for you.  Do not use any products that contain nicotine or tobacco, such as cigarettes, e-cigarettes, and chewing tobacco. If you need help quitting, ask your health care provider.  Keep all follow-up visits as told by your health care provider. This is important.   Contact a health care provider if you:  Vomit.  Have diarrhea.  Have a fever for more than 2-3 days.  Feel more thirsty than usual.  Feel weak and tired. Get help right away if you:  Have chest pain.  Have a fast or irregular heartbeat.  Develop numbness in any part of your body.  Cannot move your arms or your legs.  Have trouble speaking.  Become sweaty or feel light-headed.  Faint.  Feel short of breath.  Have trouble staying awake.  Feel confused. Summary  Hypotension is when the force of blood pumping through your arteries is too weak.  Hypotension may be harmless (benign) or may cause serious problems (be critical).  Treatment for this condition may include changing your diet, changing your medicines, and wearing  compression stockings.  In some cases, you may need to go to the hospital for fluid or blood replacement. This information is not intended to replace advice given to you by your health care provider. Make sure you discuss any questions you have with your health care provider. Document Revised: 12/08/2017 Document Reviewed: 12/08/2017 Elsevier Patient Education  [name] [name].

## 2020-11-07 NOTE — Patient Outreach (Signed)
[facility]) Care Management  [place]  11/07/2020   [name] [name] October 02, 1927 [id number]   Outreach attempt #3, successful to wife.  Report doing much better, home health PT completed, Echo improved.  Denies any urgent concerns, encouraged to contact this care manager with questions.    Encounter Medications:  Outpatient Encounter Medications as of 11/07/2020  Medication Sig  . apixaban (ELIQUIS) 5 MG TABS tablet Take 1 tablet (5 mg total) by mouth 2 (two) times daily.  . Ascorbic Acid (VITAMIN C) 100 MG tablet Take 100 mg by mouth daily.  Marland Kitchen aspirin 81 MG tablet Take 81 mg by mouth at bedtime.   . Cholecalciferol (VITAMIN D) 50 MCG (2000 UT) tablet Take 1 tablet by mouth daily.  Marland Kitchen docusate sodium (COLACE) 100 MG capsule Take 100 mg by mouth daily as needed for mild constipation.  . dutasteride (AVODART) 0.5 MG capsule Take 0.5 mg by mouth daily.  . eszopiclone (LUNESTA) 1 MG TABS tablet Take 1-2 mg by mouth at bedtime.  . feeding supplement, ENSURE COMPLETE, (ENSURE COMPLETE) LIQD Take 237 mLs by mouth 2 (two) times daily between meals.  . ferrous [id number] C-folic acid (TRINSICON / FOLTRIN) capsule Take 1 capsule by mouth 2 (two) times daily after a meal.  . furosemide (LASIX) 40 MG tablet Take 40 mg by mouth every other day.  . losartan (COZAAR) 25 MG tablet Take 1 tablet (25 mg total) by mouth at bedtime.  . melatonin 5 MG TABS Take 1 tablet (5 mg total) by mouth at bedtime as needed (sleep).  . Multiple Vitamins-Minerals (CENTRUM SILVER PO) Take 1 tablet by mouth daily.   . ondansetron (ZOFRAN) 4 MG tablet Take 1 tablet (4 mg total) by mouth every 8 (eight) hours as needed for nausea or vomiting.  . sertraline (ZOLOFT) 50 MG tablet Take 1 tablet by mouth daily.  Marland Kitchen sulfaSALAzine (AZULFIDINE) 500 MG tablet Take 2,000 mg by mouth daily.  . tamsulosin (FLOMAX) 0.4 MG CAPS Take 0.4 mg by mouth 2 (two) times daily.   . vitamin B-12 (CYANOCOBALAMIN) 100  MCG tablet Take 1 tablet by mouth daily.   No facility-administered encounter medications on file as of 11/07/2020.    Functional Status:  In your present state of health, do you have any difficulty performing the following activities: 05/16/2020 05/16/2020  Hearing? - Y  Vision? - N  Difficulty concentrating or making decisions? - N  Walking or climbing stairs? - Y  Dressing or bathing? - N  Doing errands, shopping? N -  Some recent data might be hidden    Fall/Depression Screening: No flowsheet data found. PHQ 2/9 Scores 07/30/2020  PHQ - 2 Score 1  Exception Documentation (No Data)    Assessment:  Goals Addressed            This Visit's Progress   . [facility] - Improve My Heart Health-Coronary Artery Disease   On track    Timeframe:  Long-Range Goal Priority:  High Start Date:        4/7  (date reset)            Expected End Date:       7/7     Barriers: Knowledge    - be open to making changes - learn about small changes that will make a big difference    Why is this important?    Lifestyle changes are key to improving the blood flow to your heart. Think about the things you  can change and set a goal to live healthy.   Remember, when the blood vessels to your heart start to get clogged you may not have any symptoms.   Over time, they can get worse.   Don't ignore the signs, like chest pain, and get help right away.     Notes:   Encouraged to contact MD with any potential complications  3/4 - Discussed medication changes with wife, encouraged to monitor BP, HR, and weights daily  4/7 - Discussed plan to have member participate in cardiac rehab  5/13 - Reviewed blood pressure trends, remain low (90-110s/60-70's).  Confirms he is building endurance, will start rehab next week    . [facility] - Make and Keep All Appointments   On track    Timeframe:  Short-Term Goal Priority:  Medium Start Date:         5/13        Expected End Date:      6/13  Barriers: Knowledge          - ask family or friend for a ride - call to cancel if needed - keep a calendar with prescription refill dates    Why is this important?    Part of staying healthy is seeing the doctor for follow-up care.   If you forget your appointments, there are some things you can do to stay on track.    Notes:   2/9 - Upcoming appointments reviewed with wife  2/25 - HF clinic visit reviewed with wife, reminded of upcoming appointments  3/4 - Reviewed PCP visit, discussed follow up.  Encouraged to reschedule if needed  4/7 - Has visit with surgeon on 4/14, assessed need for transportation assistance  5/13 - Confirmed member attended appointment with cardiology, had ECHO completed.  Now has evaluation scheduled for next week with Cardiac rehab        Plan:  Follow-up:  Patient agrees to Care Plan and Follow-up.  Will send education regarding hypotension.  Will follow up with member/wife within the next month.  [name], [place], MSN [place] [phone]

## 2020-11-12 ENCOUNTER — Telehealth (HOSPITAL_COMMUNITY): Payer: Self-pay | Admitting: *Deleted

## 2020-11-12 ENCOUNTER — Encounter (HOSPITAL_COMMUNITY)
Admission: RE | Admit: 2020-11-12 | Discharge: 2020-11-12 | Disposition: A | Discharge status: Home / Self Care | Payer: Medicare HMO | Source: Ambulatory Visit | Attending: Cardiology | Admitting: Cardiology

## 2020-11-12 DIAGNOSIS — Z7901 Long term (current) use of anticoagulants: Secondary | ICD-10-CM | POA: Insufficient documentation

## 2020-11-12 DIAGNOSIS — Z79899 Other long term (current) drug therapy: Secondary | ICD-10-CM | POA: Insufficient documentation

## 2020-11-12 DIAGNOSIS — I5022 Chronic systolic (congestive) heart failure: Secondary | ICD-10-CM | POA: Insufficient documentation

## 2020-11-12 DIAGNOSIS — Z951 Presence of aortocoronary bypass graft: Secondary | ICD-10-CM | POA: Insufficient documentation

## 2020-11-12 DIAGNOSIS — Z48812 Encounter for surgical aftercare following surgery on the circulatory system: Secondary | ICD-10-CM | POA: Insufficient documentation

## 2020-11-12 DIAGNOSIS — Z952 Presence of prosthetic heart valve: Secondary | ICD-10-CM | POA: Insufficient documentation

## 2020-11-12 DIAGNOSIS — Z7982 Long term (current) use of aspirin: Secondary | ICD-10-CM | POA: Insufficient documentation

## 2020-11-12 NOTE — Telephone Encounter (Signed)
Spoke with Truman Hayward and Tomi Bamberger. Confirmed orientation appointment. Completed health history.[name], RN,BSN 11/12/2020 2:57 PM

## 2020-11-12 NOTE — Telephone Encounter (Signed)
Left message to call cardiac rehab regarding cardiac rehab orientation.

## 2020-11-13 ENCOUNTER — Encounter (HOSPITAL_COMMUNITY)
Admission: RE | Admit: 2020-11-13 | Discharge: 2020-11-13 | Disposition: A | Discharge status: Home / Self Care | Payer: Medicare HMO | Source: Ambulatory Visit | Attending: Cardiology | Admitting: Cardiology

## 2020-11-13 ENCOUNTER — Encounter: Payer: Self-pay | Admitting: Internal Medicine

## 2020-11-13 ENCOUNTER — Other Ambulatory Visit: Payer: Self-pay

## 2020-11-13 VITALS — BP 110/69 | HR 72 | Ht 68.5 in | Wt 170.6 lb

## 2020-11-13 DIAGNOSIS — Z951 Presence of aortocoronary bypass graft: Secondary | ICD-10-CM

## 2020-11-13 DIAGNOSIS — Z952 Presence of prosthetic heart valve: Secondary | ICD-10-CM

## 2020-11-14 ENCOUNTER — Encounter (HOSPITAL_COMMUNITY): Payer: Self-pay

## 2020-11-14 DIAGNOSIS — R69 Illness, unspecified: Secondary | ICD-10-CM | POA: Diagnosis not present

## 2020-11-14 DIAGNOSIS — I10 Essential (primary) hypertension: Secondary | ICD-10-CM | POA: Diagnosis not present

## 2020-11-14 DIAGNOSIS — I959 Hypotension, unspecified: Secondary | ICD-10-CM | POA: Diagnosis not present

## 2020-11-14 NOTE — Progress Notes (Signed)
Cardiac Individual Treatment Plan  Patient Details  Name: [name] [name] MRN: [id number] Date of Birth: [birth year] Referring Provider:   Flowsheet Row CARDIAC REHAB PHASE II ORIENTATION from 11/13/2020 in [place]  Referring Provider Dr. Peter Martinique, MD       Initial Encounter Date:  [place] PHASE II ORIENTATION from 11/13/2020 in [place]  Date 11/13/20       Visit Diagnosis: 05/23/20 S/P CABG x 4  05/23/20 AVR  Patient's Home Medications on Admission:  Current Outpatient Medications:  .  apixaban (ELIQUIS) 5 MG TABS tablet, Take 1 tablet (5 mg total) by mouth 2 (two) times daily., Disp: 180 tablet, Rfl: 1 .  Ascorbic Acid (VITAMIN C) 100 MG tablet, Take 100 mg by mouth daily., Disp: , Rfl:  .  aspirin 81 MG tablet, Take 81 mg by mouth at bedtime. , Disp: , Rfl:  .  Cholecalciferol (VITAMIN D) 50 MCG (2000 UT) tablet, Take 1 tablet by mouth daily., Disp: , Rfl:  .  docusate sodium (COLACE) 100 MG capsule, Take 100 mg by mouth daily as needed for mild constipation., Disp: , Rfl:  .  dutasteride (AVODART) 0.5 MG capsule, Take 0.5 mg by mouth daily., Disp: , Rfl:  .  eszopiclone (LUNESTA) 1 MG TABS tablet, Take 1-2 mg by mouth at bedtime., Disp: , Rfl:  .  feeding supplement, ENSURE COMPLETE, (ENSURE COMPLETE) LIQD, Take 237 mLs by mouth 2 (two) times daily between meals., Disp: , Rfl:  .  ferrous [id number] C-folic acid (TRINSICON / FOLTRIN) capsule, Take 1 capsule by mouth 2 (two) times daily after a meal., Disp: , Rfl:  .  furosemide (LASIX) 40 MG tablet, Take 40 mg by mouth every other day., Disp: , Rfl:  .  losartan (COZAAR) 25 MG tablet, Take 1 tablet (25 mg total) by mouth at bedtime., Disp: 90 tablet, Rfl: 3 .  melatonin 5 MG TABS, Take 1 tablet (5 mg total) by mouth at bedtime as needed (sleep)., Disp: , Rfl: 0 .  Multiple Vitamins-Minerals (CENTRUM SILVER PO), Take 1 tablet by  mouth daily. , Disp: , Rfl:  .  ondansetron (ZOFRAN) 4 MG tablet, Take 1 tablet (4 mg total) by mouth every 8 (eight) hours as needed for nausea or vomiting., Disp: 20 tablet, Rfl: 0 .  sertraline (ZOLOFT) 50 MG tablet, Take 1 tablet by mouth daily., Disp: , Rfl:  .  sulfaSALAzine (AZULFIDINE) 500 MG tablet, Take 2,000 mg by mouth daily., Disp: , Rfl:  .  tamsulosin (FLOMAX) 0.4 MG CAPS, Take 0.4 mg by mouth 2 (two) times daily. , Disp: , Rfl:  .  vitamin B-12 (CYANOCOBALAMIN) 100 MCG tablet, Take 1 tablet by mouth daily., Disp: , Rfl:   Past Medical History: Past Medical History:  Diagnosis Date  . Aortic stenosis   . Arthritis   . CAD (coronary artery disease)   . CHF (congestive heart failure) ([place])   . Colitis   . Edema   . Essential hypertension    no med now  . Essential tremor   . Hyperlipidemia   . Hypotonic bladder    L4-L5  . Obstructive uropathy   . Peripheral neuropathy    Elevated by Dr. love 2010  . Prostate cancer ([place]) 2005    Tobacco Use: Social History   Tobacco Use  Smoking Status Never Smoker  Smokeless Tobacco Never Used    Labs: Recent Review Scientist, physiological  Labs for ITP Cardiac and Pulmonary Rehab Latest Ref Rng & Units 06/12/2020 06/13/2020 06/14/2020 06/15/2020 06/16/2020   Hemoglobin A1c 4.8 - 5.6 % - - - - -   PHART 7.350 - 7.450 - - - - -   PCO2ART 32.0 - 48.0 mmHg - - - - -   HCO3 20.0 - 28.0 mmol/L - - - - -   TCO2 22 - 32 mmol/L - - - - -   ACIDBASEDEF 0.0 - 2.0 mmol/L - - - - -   O2SAT % 62.0 74.9 75.8 70.7 64.7       Capillary Blood Glucose: Lab Results  Component Value Date   GLUCAP 115 (H) 06/23/2020   GLUCAP 106 (H) 06/23/2020   GLUCAP 105 (H) 06/22/2020   GLUCAP 143 (H) 06/22/2020   GLUCAP 155 (H) 06/22/2020      Exercise Target Goals: Exercise Program Goal: Individual exercise prescription set using results from initial 6 min walk test and THRR while considering  patient's activity barriers and safety.    Exercise Prescription Goal: Starting with aerobic activity 30 plus minutes a day, 3 days per week for initial exercise prescription. Provide home exercise prescription and guidelines that participant acknowledges understanding prior to discharge.  Activity Barriers & Risk Stratification:  Activity Barriers & Cardiac Risk Stratification - 11/13/20 1621       Activity Barriers & Cardiac Risk Stratification   Activity Barriers Decreased Ventricular Function;Balance Concerns;Left Knee Replacement;Right Knee Replacement;Shortness of Breath;Deconditioning;History of Falls;Assistive Device    Cardiac Risk Stratification High             6 Minute Walk:  6 Minute Walk     Row Name 11/13/20 1619         6 Minute Walk   Phase Initial     Distance 1214 feet  NuStep Stepper test     Walk Time 6 minutes     # of Rest Breaks 0     MPH 2.3     METS 1.9     RPE 10     Perceived Dyspnea  1     VO2 Peak 6.66     Symptoms No     Resting HR 72 bpm     Resting BP 110/69     Resting Oxygen Saturation  98 %     Exercise Oxygen Saturation  during 6 min walk 95 %     Max Ex. HR 113 bpm     Max Ex. BP 128/71     2 Minute Post BP 102/66              Oxygen Initial Assessment:    Oxygen Re-Evaluation:    Oxygen Discharge (Final Oxygen Re-Evaluation):    Initial Exercise Prescription:  Initial Exercise Prescription - 11/13/20 1600       Date of Initial Exercise RX and Referring Provider   Date 11/13/20    Referring Provider Dr. Peter Martinique, MD    Expected Discharge Date 01/09/21      NuStep   Level 1    SPM 65    Minutes 20    METs 1.2      Prescription Details   Frequency (times per week) 3    Duration Progress to 30 minutes of continuous aerobic without signs/symptoms of physical distress      Intensity   THRR 40-80% of Max Heartrate 52-104    Ratings of Perceived Exertion 11-13    Perceived Dyspnea 0-4  Progression   Progression Continue progressive  overload as per policy without signs/symptoms or physical distress.      Resistance Training   Training Prescription Yes    Weight 4    Reps 10-15             Perform Capillary Blood Glucose checks as needed.  Exercise Prescription Changes:    Exercise Comments:    Exercise Goals and Review:  Exercise Goals     Row Name 11/13/20 1622             Exercise Goals   Increase Physical Activity Yes       Intervention Provide advice, education, support and counseling about physical activity/exercise needs.;Develop an individualized exercise prescription for aerobic and resistive training based on initial evaluation findings, risk stratification, comorbidities and participant's personal goals.       Expected Outcomes Short Term: Attend rehab on a regular basis to increase amount of physical activity.;Long Term: Add in home exercise to make exercise part of routine and to increase amount of physical activity.;Long Term: Exercising regularly at least 3-5 days a week.       Increase Strength and Stamina Yes       Intervention Provide advice, education, support and counseling about physical activity/exercise needs.;Develop an individualized exercise prescription for aerobic and resistive training based on initial evaluation findings, risk stratification, comorbidities and participant's personal goals.       Expected Outcomes Short Term: Increase workloads from initial exercise prescription for resistance, speed, and METs.;Short Term: Perform resistance training exercises routinely during rehab and add in resistance training at home;Long Term: Improve cardiorespiratory fitness, muscular endurance and strength as measured by increased METs and functional capacity (6MWT)       Able to understand and use rate of perceived exertion (RPE) scale Yes       Intervention Provide education and explanation on how to use RPE scale       Expected Outcomes Short Term: Able to use RPE daily in rehab to  express subjective intensity level;Long Term:  Able to use RPE to guide intensity level when exercising independently       Knowledge and understanding of Target Heart Rate Range (THRR) Yes       Intervention Provide education and explanation of THRR including how the numbers were predicted and where they are located for reference       Expected Outcomes Short Term: Able to state/look up THRR;Long Term: Able to use THRR to govern intensity when exercising independently;Short Term: Able to use daily as guideline for intensity in rehab       Understanding of Exercise Prescription Yes       Intervention Provide education, explanation, and written materials on patient's individual exercise prescription       Expected Outcomes Short Term: Able to explain program exercise prescription;Long Term: Able to explain home exercise prescription to exercise independently                Exercise Goals Re-Evaluation :     Discharge Exercise Prescription (Final Exercise Prescription Changes):    Nutrition:  Target Goals: Understanding of nutrition guidelines, daily intake of sodium 1500mg , cholesterol 200mg , calories 30% from fat and 7% or less from saturated fats, daily to have 5 or more servings of fruits and vegetables.  Biometrics:  Pre Biometrics - 11/13/20 1557       Pre Biometrics   Waist Circumference 41 inches    Hip Circumference 37 inches  Waist to Hip Ratio 1.11 %    Triceps Skinfold 16 mm    % Body Fat 28.2 %    Grip Strength 28 kg    Flexibility --   Not performed because of poor mobility and balance   Single Leg Stand --   Not performed because of poor mobility and balance              Nutrition Therapy Plan and Nutrition Goals:    Nutrition Assessments:   MEDIFICTS Score Key:  ?70 Need to make dietary changes   40-70 Heart Healthy Diet  ? 40 Therapeutic Level Cholesterol Diet    Picture Your Plate Scores:  <20 Unhealthy dietary pattern with much room  for improvement.  41-50 Dietary pattern unlikely to meet recommendations for good health and room for improvement.  51-60 More healthful dietary pattern, with some room for improvement.   >60 Healthy dietary pattern, although there may be some specific behaviors that could be improved.    Nutrition Goals Re-Evaluation:    Nutrition Goals Discharge (Final Nutrition Goals Re-Evaluation):    Psychosocial: Target Goals: Acknowledge presence or absence of significant depression and/or stress, maximize coping skills, provide positive support system. Participant is able to verbalize types and ability to use techniques and skills needed for reducing stress and depression.  Initial Review & Psychosocial Screening:  Initial Psych Review & Screening - 11/13/20 1635       Initial Review   Current issues with History of Depression;Current Stress Concerns    Source of Stress Concerns Chronic Illness;Unable to participate in former interests or hobbies;Retirement/disability;Unable to perform yard/household activities;Transportation    Comments Truman Hayward admits to being depressed after surgery. Unable to work or drive due to deconditionin. Says depression is better now      [place]? Yes   Truman Hayward has his wife for support   Comments Reviewed quality of life questionnaire with patient given copy to review with Dr Guadlupe Spanish primary care provider      Barriers   Psychosocial barriers to participate in program The patient should benefit from training in stress management and relaxation.;Psychosocial barriers identified (see note)      Screening Interventions   Interventions Encouraged to exercise;To provide support and resources with identified psychosocial needs;Provide feedback about the scores to participant    Expected Outcomes Short Term goal: Utilizing psychosocial counselor, staff and physician to assist with identification of specific Stressors or current issues  interfering with healing process. Setting desired goal for each stressor or current issue identified.;Long Term Goal: Stressors or current issues are controlled or eliminated.;Short Term goal: Identification and review with participant of any Quality of Life or Depression concerns found by scoring the questionnaire.             Quality of Life Scores:  Quality of Life - 11/13/20 1630       Quality of Life   Select Quality of Life      Quality of Life Scores   Health/Function Pre 10.21 %    Socioeconomic Pre 21.63 %    Psych/Spiritual Pre 10.07 %    Family Pre 23.7 %    GLOBAL Pre 14.85 %            Scores of 19 and below usually indicate a poorer quality of life in these areas.  A difference of  2-3 points is a clinically meaningful difference.  A difference of 2-3 points in the total score of  the Quality of Life Index has been associated with significant improvement in overall quality of life, self-image, physical symptoms, and general health in studies assessing change in quality of life.  PHQ-9: Recent Review Flowsheet Data     Depression screen [facility] 2/9 11/13/2020 07/30/2020   Decreased Interest 0 0   Down, Depressed, Hopeless 0 1   PHQ - 2 Score 0 1      Interpretation of Total Score  Total Score Depression Severity:  1-4 = Minimal depression, 5-9 = Mild depression, 10-14 = Moderate depression, 15-19 = Moderately severe depression, 20-27 = Severe depression   Psychosocial Evaluation and Intervention:    Psychosocial Re-Evaluation:    Psychosocial Discharge (Final Psychosocial Re-Evaluation):    Vocational Rehabilitation: Provide vocational rehab assistance to qualifying candidates.   Vocational Rehab Evaluation & Intervention:  Vocational Rehab - 11/13/20 1639       Initial Vocational Rehab Evaluation & Intervention   Assessment shows need for Vocational Rehabilitation No   [place] is not currently working and does not need vocational rehab at this time             Education: Education Goals: Education classes will be provided on a weekly basis, covering required topics. Participant will state understanding/return demonstration of topics presented.  Learning Barriers/Preferences:  Learning Barriers/Preferences - 11/13/20 1631       Learning Barriers/Preferences   Learning Barriers Hearing;Sight;Exercise Concerns   Wears glasses/hearing aides/uses cane   Learning Preferences Audio;Computer/Internet;Group Instruction;Individual Instruction;Pictoral;Skilled Demonstration;Verbal Instruction;Video;Written Material             Education Topics: Hypertension, Hypertension Reduction -Define heart disease and high blood pressure. Discus how high blood pressure affects the body and ways to reduce high blood pressure.    Exercise and Your Heart -Discuss why it is important to exercise, the FITT principles of exercise, normal and abnormal responses to exercise, and how to exercise safely.    Angina -Discuss definition of angina, causes of angina, treatment of angina, and how to decrease risk of having angina.    Cardiac Medications -Review what the following cardiac medications are used for, how they affect the body, and side effects that may occur when taking the medications.  Medications include Aspirin, Beta blockers, calcium channel blockers, ACE Inhibitors, angiotensin receptor blockers, diuretics, digoxin, and antihyperlipidemics.    Congestive Heart Failure -Discuss the definition of CHF, how to live with CHF, the signs and symptoms of CHF, and how keep track of weight and sodium intake.    Heart Disease and Intimacy -Discus the effect sexual activity has on the heart, how changes occur during intimacy as we age, and safety during sexual activity.    Smoking Cessation / COPD -Discuss different methods to quit smoking, the health benefits of quitting smoking, and the definition of COPD.    Nutrition I: Fats -Discuss the  types of cholesterol, what cholesterol does to the heart, and how cholesterol levels can be controlled.    Nutrition II: Labels -Discuss the different components of food labels and how to read food label    Heart Parts/Heart Disease and PAD -Discuss the anatomy of the heart, the pathway of blood circulation through the heart, and these are affected by heart disease.    Stress I: Signs and Symptoms -Discuss the causes of stress, how stress may lead to anxiety and depression, and ways to limit stress.    Stress II: Relaxation -Discuss different types of relaxation techniques to limit stress.    Warning Signs of  Stroke / TIA -Discuss definition of a stroke, what the signs and symptoms are of a stroke, and how to identify when someone is having stroke.    Knowledge Questionnaire Score:  Knowledge Questionnaire Score - 11/13/20 1634       Knowledge Questionnaire Score   Pre Score 23/24             Core Components/Risk Factors/Patient Goals at Admission:  Personal Goals and Risk Factors at Admission - 11/13/20 1636       Core Components/Risk Factors/Patient Goals on Admission    Weight Management Yes;Weight Maintenance    Intervention Weight Management: Develop a combined nutrition and exercise program designed to reach desired caloric intake, while maintaining appropriate intake of nutrient and fiber, sodium and fats, and appropriate energy expenditure required for the weight goal.;Weight Management: Provide education and appropriate resources to help participant work on and attain dietary goals.    Expected Outcomes Weight Maintenance: Understanding of the daily nutrition guidelines, which includes 25-35% calories from fat, 7% or less cal from saturated fats, less than 200mg  cholesterol, less than 1.5gm of sodium, & 5 or more servings of fruits and vegetables daily;Long Term: Adherence to nutrition and physical activity/exercise program aimed toward attainment of established  weight goal;Short Term: Continue to assess and modify interventions until short term weight is achieved;Understanding recommendations for meals to include 15-35% energy as protein, 25-35% energy from fat, 35-60% energy from carbohydrates, less than 200mg  of dietary cholesterol, 20-35 gm of total fiber daily;Understanding of distribution of calorie intake throughout the day with the consumption of 4-5 meals/snacks    Heart Failure Yes    Intervention Provide a combined exercise and nutrition program that is supplemented with education, support and counseling about heart failure. Directed toward relieving symptoms such as shortness of breath, decreased exercise tolerance, and extremity edema.    Expected Outcomes Short term: Attendance in program 2-3 days a week with increased exercise capacity. Reported lower sodium intake. Reported increased fruit and vegetable intake. Reports medication compliance.;Short term: Daily weights obtained and reported for increase. Utilizing diuretic protocols set by physician.;Long term: Adoption of self-care skills and reduction of barriers for early signs and symptoms recognition and intervention leading to self-care maintenance.    Hypertension Yes    Intervention Provide education on lifestyle modifcations including regular physical activity/exercise, weight management, moderate sodium restriction and increased consumption of fresh fruit, vegetables, and low fat dairy, alcohol moderation, and smoking cessation.;Monitor prescription use compliance.    Expected Outcomes Short Term: Continued assessment and intervention until BP is < 140/67mm HG in hypertensive participants. < 130/32mm HG in hypertensive participants with diabetes, heart failure or chronic kidney disease.;Long Term: Maintenance of blood pressure at goal levels.    Personal Goal Other Yes    Personal Goal Pt wants to increase strength, endurance, and feel a little more normal. He would also like to not be as  dependant on his cane.    Intervention Will progress workloads as tolerated without sign or symptom to help pt re-gain strength    Expected Outcomes Pt will achieve his goals             Core Components/Risk Factors/Patient Goals Review:     Core Components/Risk Factors/Patient Goals at Discharge (Final Review):     ITP Comments:  ITP Comments     Row Name 11/13/20 1634           ITP Comments Dr Fransico Him MD, Medical Director  Comments: L[place] attended orientation on 11/14/2020 to review rules and guidelines for program.  Completed 6 minute nustep test due to high fall risk. Patient uses a cane, Intitial ITP, and exercise prescription.  VSS. Telemetry-Sinus Rhythm Bundle Branch Block intermittent PVC's .  Asymptomatic. Patient is very deconditioned Safety measures and social distancing in place per CDC guidelines. Will forward today's ECG tracings to Dr Bensimhon's office for review.[name], RN,BSN 11/14/2020 10:32 AM

## 2020-11-14 NOTE — Progress Notes (Signed)
Cardiac Rehab Medication Review by a Nurse  Does the patient  feel that his/her medications are working for him/her?  yes  Has the patient been experiencing any side effects to the medications prescribed?  yes  Does the patient measure his/her own blood pressure or blood glucose at home?  yes   Does the patient have any problems obtaining medications due to transportation or finances?   no  Understanding of regimen: good Understanding of indications: good Potential of compliance: excellent    Nurse comments: Patient here for cardiac rehab orientation. Wife present. Medications reviewed. [name] [name] he sometimes feels foggy when he wakes up in the morning. Left arm BP 110/69 sitting. Standing BP 96/64. Right arm 106/56 sitting. Standing BP 104/66 heart rate 76. BP's faxed to Dr Bensimhon's office for review.    Harrell Gave RN BSN 11/14/2020 8:31 AM

## 2020-11-17 ENCOUNTER — Telehealth (HOSPITAL_COMMUNITY): Payer: Self-pay | Admitting: *Deleted

## 2020-11-17 ENCOUNTER — Encounter (HOSPITAL_COMMUNITY)
Admission: RE | Admit: 2020-11-17 | Discharge: 2020-11-17 | Disposition: A | Discharge status: Home / Self Care | Payer: Medicare HMO | Source: Ambulatory Visit | Attending: Cardiology | Admitting: Cardiology

## 2020-11-17 ENCOUNTER — Other Ambulatory Visit: Payer: Self-pay

## 2020-11-17 DIAGNOSIS — Z951 Presence of aortocoronary bypass graft: Secondary | ICD-10-CM

## 2020-11-17 DIAGNOSIS — Z952 Presence of prosthetic heart valve: Secondary | ICD-10-CM

## 2020-11-17 NOTE — Progress Notes (Signed)
Cardiac Individual Treatment Plan  Patient Details  Name: [name] [name] MRN: [id number] Date of Birth: December 29, 1927 Referring Provider:   Flowsheet Row CARDIAC REHAB PHASE II ORIENTATION from 11/13/2020 in [place]  Referring Provider Dr. Peter Martinique, MD      Initial Encounter Date:  [place] PHASE II ORIENTATION from 11/13/2020 in [place]  Date 11/13/20      Visit Diagnosis: 05/23/20 S/P CABG x 4  05/23/20 AVR  Patient's Home Medications on Admission:  Current Outpatient Medications:  .  apixaban (ELIQUIS) 5 MG TABS tablet, Take 1 tablet (5 mg total) by mouth 2 (two) times daily., Disp: 180 tablet, Rfl: 1 .  Ascorbic Acid (VITAMIN C) 100 MG tablet, Take 100 mg by mouth daily., Disp: , Rfl:  .  aspirin 81 MG tablet, Take 81 mg by mouth at bedtime. , Disp: , Rfl:  .  Cholecalciferol (VITAMIN D) 50 MCG (2000 UT) tablet, Take 1 tablet by mouth daily., Disp: , Rfl:  .  docusate sodium (COLACE) 100 MG capsule, Take 100 mg by mouth daily as needed for mild constipation., Disp: , Rfl:  .  dutasteride (AVODART) 0.5 MG capsule, Take 0.5 mg by mouth daily., Disp: , Rfl:  .  eszopiclone (LUNESTA) 1 MG TABS tablet, Take 1-2 mg by mouth at bedtime., Disp: , Rfl:  .  feeding supplement, ENSURE COMPLETE, (ENSURE COMPLETE) LIQD, Take 237 mLs by mouth 2 (two) times daily between meals., Disp: , Rfl:  .  ferrous [id number] C-folic acid (TRINSICON / FOLTRIN) capsule, Take 1 capsule by mouth 2 (two) times daily after a meal., Disp: , Rfl:  .  furosemide (LASIX) 40 MG tablet, Take 40 mg by mouth every other day., Disp: , Rfl:  .  losartan (COZAAR) 25 MG tablet, Take 1 tablet (25 mg total) by mouth at bedtime. (Patient taking differently: Take 12.5 mg by mouth at bedtime. Dose decreased to a 1/2 of a 25 mg tablet By Dr Addison Lank on 11/14/20 due to low BP), Disp: 90 tablet, Rfl: 3 .  melatonin 5 MG TABS, Take 1  tablet (5 mg total) by mouth at bedtime as needed (sleep)., Disp: , Rfl: 0 .  Multiple Vitamins-Minerals (CENTRUM SILVER PO), Take 1 tablet by mouth daily. , Disp: , Rfl:  .  ondansetron (ZOFRAN) 4 MG tablet, Take 1 tablet (4 mg total) by mouth every 8 (eight) hours as needed for nausea or vomiting., Disp: 20 tablet, Rfl: 0 .  sertraline (ZOLOFT) 50 MG tablet, Take 1 tablet by mouth daily., Disp: , Rfl:  .  sulfaSALAzine (AZULFIDINE) 500 MG tablet, Take 2,000 mg by mouth daily., Disp: , Rfl:  .  tamsulosin (FLOMAX) 0.4 MG CAPS, Take 0.4 mg by mouth 2 (two) times daily. , Disp: , Rfl:  .  vitamin B-12 (CYANOCOBALAMIN) 100 MCG tablet, Take 1 tablet by mouth daily., Disp: , Rfl:   Past Medical History: Past Medical History:  Diagnosis Date  . Aortic stenosis   . Arthritis   . CAD (coronary artery disease)   . CHF (congestive heart failure) ([place])   . Colitis   . Edema   . Essential hypertension    no med now  . Essential tremor   . Hyperlipidemia   . Hypotonic bladder    L4-L5  . Obstructive uropathy   . Peripheral neuropathy    Elevated by Dr. love 2010  . Prostate cancer ([place]) 2005    Tobacco  Use: Social History   Tobacco Use  Smoking Status Never Smoker  Smokeless Tobacco Never Used    Labs: Recent Review Flowsheet Data    Labs for ITP Cardiac and Pulmonary Rehab Latest Ref Rng & Units 06/12/2020 06/13/2020 06/14/2020 06/15/2020 06/16/2020   Hemoglobin A1c 4.8 - 5.6 % - - - - -   PHART 7.350 - 7.450 - - - - -   PCO2ART 32.0 - 48.0 mmHg - - - - -   HCO3 20.0 - 28.0 mmol/L - - - - -   TCO2 22 - 32 mmol/L - - - - -   ACIDBASEDEF 0.0 - 2.0 mmol/L - - - - -   O2SAT % 62.0 74.9 75.8 70.7 64.7      Capillary Blood Glucose: Lab Results  Component Value Date   GLUCAP 115 (H) 06/23/2020   GLUCAP 106 (H) 06/23/2020   GLUCAP 105 (H) 06/22/2020   GLUCAP 143 (H) 06/22/2020   GLUCAP 155 (H) 06/22/2020     Exercise Target Goals: Exercise Program Goal: Individual exercise  prescription set using results from initial 6 min walk test and THRR while considering  patient's activity barriers and safety.   Exercise Prescription Goal: Starting with aerobic activity 30 plus minutes a day, 3 days per week for initial exercise prescription. Provide home exercise prescription and guidelines that participant acknowledges understanding prior to discharge.  Activity Barriers & Risk Stratification:  Activity Barriers & Cardiac Risk Stratification - 11/13/20 1621      Activity Barriers & Cardiac Risk Stratification   Activity Barriers Decreased Ventricular Function;Balance Concerns;Left Knee Replacement;Right Knee Replacement;Shortness of Breath;Deconditioning;History of Falls;Assistive Device    Cardiac Risk Stratification High           6 Minute Walk:  6 Minute Walk    Row Name 11/13/20 1619         6 Minute Walk   Phase Initial     Distance 1214 feet  NuStep Stepper test     Walk Time 6 minutes     # of Rest Breaks 0     MPH 2.3     METS 1.9     RPE 10     Perceived Dyspnea  1     VO2 Peak 6.66     Symptoms No     Resting HR 72 bpm     Resting BP 110/69     Resting Oxygen Saturation  98 %     Exercise Oxygen Saturation  during 6 min walk 95 %     Max Ex. HR 113 bpm     Max Ex. BP 128/71     2 Minute Post BP 102/66            Oxygen Initial Assessment:   Oxygen Re-Evaluation:   Oxygen Discharge (Final Oxygen Re-Evaluation):   Initial Exercise Prescription:  Initial Exercise Prescription - 11/13/20 1600      Date of Initial Exercise RX and Referring Provider   Date 11/13/20    Referring Provider Dr. Peter Martinique, MD    Expected Discharge Date 01/09/21      NuStep   Level 1    SPM 65    Minutes 20    METs 1.2      Prescription Details   Frequency (times per week) 3    Duration Progress to 30 minutes of continuous aerobic without signs/symptoms of physical distress      Intensity   THRR 40-80% of Max Heartrate 52-104  Ratings  of Perceived Exertion 11-13    Perceived Dyspnea 0-4      Progression   Progression Continue progressive overload as per policy without signs/symptoms or physical distress.      Resistance Training   Training Prescription Yes    Weight 4    Reps 10-15           Perform Capillary Blood Glucose checks as needed.  Exercise Prescription Changes:   Exercise Comments:   Exercise Goals and Review:   Exercise Goals    Row Name 11/13/20 1622             Exercise Goals   Increase Physical Activity Yes       Intervention Provide advice, education, support and counseling about physical activity/exercise needs.;Develop an individualized exercise prescription for aerobic and resistive training based on initial evaluation findings, risk stratification, comorbidities and participant's personal goals.       Expected Outcomes Short Term: Attend rehab on a regular basis to increase amount of physical activity.;Long Term: Add in home exercise to make exercise part of routine and to increase amount of physical activity.;Long Term: Exercising regularly at least 3-5 days a week.       Increase Strength and Stamina Yes       Intervention Provide advice, education, support and counseling about physical activity/exercise needs.;Develop an individualized exercise prescription for aerobic and resistive training based on initial evaluation findings, risk stratification, comorbidities and participant's personal goals.       Expected Outcomes Short Term: Increase workloads from initial exercise prescription for resistance, speed, and METs.;Short Term: Perform resistance training exercises routinely during rehab and add in resistance training at home;Long Term: Improve cardiorespiratory fitness, muscular endurance and strength as measured by increased METs and functional capacity (6MWT)       Able to understand and use rate of perceived exertion (RPE) scale Yes       Intervention Provide education and  explanation on how to use RPE scale       Expected Outcomes Short Term: Able to use RPE daily in rehab to express subjective intensity level;Long Term:  Able to use RPE to guide intensity level when exercising independently       Knowledge and understanding of Target Heart Rate Range (THRR) Yes       Intervention Provide education and explanation of THRR including how the numbers were predicted and where they are located for reference       Expected Outcomes Short Term: Able to state/look up THRR;Long Term: Able to use THRR to govern intensity when exercising independently;Short Term: Able to use daily as guideline for intensity in rehab       Understanding of Exercise Prescription Yes       Intervention Provide education, explanation, and written materials on patient's individual exercise prescription       Expected Outcomes Short Term: Able to explain program exercise prescription;Long Term: Able to explain home exercise prescription to exercise independently              Exercise Goals Re-Evaluation :    Discharge Exercise Prescription (Final Exercise Prescription Changes):   Nutrition:  Target Goals: Understanding of nutrition guidelines, daily intake of sodium 1500mg , cholesterol 200mg , calories 30% from fat and 7% or less from saturated fats, daily to have 5 or more servings of fruits and vegetables.  Biometrics:  Pre Biometrics - 11/13/20 1557      Pre Biometrics   Waist Circumference 41 inches  Hip Circumference 37 inches    Waist to Hip Ratio 1.11 %    Triceps Skinfold 16 mm    % Body Fat 28.2 %    Grip Strength 28 kg    Flexibility --   Not performed because of poor mobility and balance   Single Leg Stand --   Not performed because of poor mobility and balance           Nutrition Therapy Plan and Nutrition Goals:   Nutrition Assessments:  MEDIFICTS Score Key:  ?70 Need to make dietary changes   40-70 Heart Healthy Diet  ? 40 Therapeutic Level  Cholesterol Diet   Picture Your Plate Scores:  <70 Unhealthy dietary pattern with much room for improvement.  41-50 Dietary pattern unlikely to meet recommendations for good health and room for improvement.  51-60 More healthful dietary pattern, with some room for improvement.   >60 Healthy dietary pattern, although there may be some specific behaviors that could be improved.    Nutrition Goals Re-Evaluation:   Nutrition Goals Discharge (Final Nutrition Goals Re-Evaluation):   Psychosocial: Target Goals: Acknowledge presence or absence of significant depression and/or stress, maximize coping skills, provide positive support system. Participant is able to verbalize types and ability to use techniques and skills needed for reducing stress and depression.  Initial Review & Psychosocial Screening:  Initial Psych Review & Screening - 11/13/20 1635      Initial Review   Current issues with History of Depression;Current Stress Concerns    Source of Stress Concerns Chronic Illness;Unable to participate in former interests or hobbies;Retirement/disability;Unable to perform yard/household activities;Transportation    Comments Truman Hayward admits to being depressed after surgery. Unable to work or drive due to deconditionin. Says depression is better now      [place]? Yes   Truman Hayward has his wife for support   Comments Reviewed quality of life questionnaire with patient given copy to review with Dr Guadlupe Spanish primary care provider      Barriers   Psychosocial barriers to participate in program The patient should benefit from training in stress management and relaxation.;Psychosocial barriers identified (see note)      Screening Interventions   Interventions Encouraged to exercise;To provide support and resources with identified psychosocial needs;Provide feedback about the scores to participant    Expected Outcomes Short Term goal: Utilizing psychosocial counselor,  staff and physician to assist with identification of specific Stressors or current issues interfering with healing process. Setting desired goal for each stressor or current issue identified.;Long Term Goal: Stressors or current issues are controlled or eliminated.;Short Term goal: Identification and review with participant of any Quality of Life or Depression concerns found by scoring the questionnaire.           Quality of Life Scores:  Quality of Life - 11/13/20 1630      Quality of Life   Select Quality of Life      Quality of Life Scores   Health/Function Pre 10.21 %    Socioeconomic Pre 21.63 %    Psych/Spiritual Pre 10.07 %    Family Pre 23.7 %    GLOBAL Pre 14.85 %          Scores of 19 and below usually indicate a poorer quality of life in these areas.  A difference of  2-3 points is a clinically meaningful difference.  A difference of 2-3 points in the total score of the Quality of Life Index has been  associated with significant improvement in overall quality of life, self-image, physical symptoms, and general health in studies assessing change in quality of life.  PHQ-9: Recent Review Flowsheet Data    Depression screen [facility] 2/9 11/13/2020 07/30/2020   Decreased Interest 0 0   Down, Depressed, Hopeless 0 1   PHQ - 2 Score 0 1     Interpretation of Total Score  Total Score Depression Severity:  1-4 = Minimal depression, 5-9 = Mild depression, 10-14 = Moderate depression, 15-19 = Moderately severe depression, 20-27 = Severe depression   Psychosocial Evaluation and Intervention:   Psychosocial Re-Evaluation:   Psychosocial Discharge (Final Psychosocial Re-Evaluation):   Vocational Rehabilitation: Provide vocational rehab assistance to qualifying candidates.   Vocational Rehab Evaluation & Intervention:  Vocational Rehab - 11/13/20 1639      Initial Vocational Rehab Evaluation & Intervention   Assessment shows need for Vocational Rehabilitation No   Orla is not  currently working and does not need vocational rehab at this time          Education: Education Goals: Education classes will be provided on a weekly basis, covering required topics. Participant will state understanding/return demonstration of topics presented.  Learning Barriers/Preferences:  Learning Barriers/Preferences - 11/13/20 1631      Learning Barriers/Preferences   Learning Barriers Hearing;Sight;Exercise Concerns   Wears glasses/hearing aides/uses cane   Learning Preferences Audio;Computer/Internet;Group Instruction;Individual Instruction;Pictoral;Skilled Demonstration;Verbal Instruction;Video;Written Material           Education Topics: Hypertension, Hypertension Reduction -Define heart disease and high blood pressure. Discus how high blood pressure affects the body and ways to reduce high blood pressure.   Exercise and Your Heart -Discuss why it is important to exercise, the FITT principles of exercise, normal and abnormal responses to exercise, and how to exercise safely.   Angina -Discuss definition of angina, causes of angina, treatment of angina, and how to decrease risk of having angina.   Cardiac Medications -Review what the following cardiac medications are used for, how they affect the body, and side effects that may occur when taking the medications.  Medications include Aspirin, Beta blockers, calcium channel blockers, ACE Inhibitors, angiotensin receptor blockers, diuretics, digoxin, and antihyperlipidemics.   Congestive Heart Failure -Discuss the definition of CHF, how to live with CHF, the signs and symptoms of CHF, and how keep track of weight and sodium intake.   Heart Disease and Intimacy -Discus the effect sexual activity has on the heart, how changes occur during intimacy as we age, and safety during sexual activity.   Smoking Cessation / COPD -Discuss different methods to quit smoking, the health benefits of quitting smoking, and the  definition of COPD.   Nutrition I: Fats -Discuss the types of cholesterol, what cholesterol does to the heart, and how cholesterol levels can be controlled.   Nutrition II: Labels -Discuss the different components of food labels and how to read food label   Heart Parts/Heart Disease and PAD -Discuss the anatomy of the heart, the pathway of blood circulation through the heart, and these are affected by heart disease.   Stress I: Signs and Symptoms -Discuss the causes of stress, how stress may lead to anxiety and depression, and ways to limit stress.   Stress II: Relaxation -Discuss different types of relaxation techniques to limit stress.   Warning Signs of Stroke / TIA -Discuss definition of a stroke, what the signs and symptoms are of a stroke, and how to identify when someone is having stroke.   Knowledge Questionnaire  Score:  Knowledge Questionnaire Score - 11/13/20 1634      Knowledge Questionnaire Score   Pre Score 23/24           Core Components/Risk Factors/Patient Goals at Admission:  Personal Goals and Risk Factors at Admission - 11/13/20 1636      Core Components/Risk Factors/Patient Goals on Admission    Weight Management Yes;Weight Maintenance    Intervention Weight Management: Develop a combined nutrition and exercise program designed to reach desired caloric intake, while maintaining appropriate intake of nutrient and fiber, sodium and fats, and appropriate energy expenditure required for the weight goal.;Weight Management: Provide education and appropriate resources to help participant work on and attain dietary goals.    Expected Outcomes Weight Maintenance: Understanding of the daily nutrition guidelines, which includes 25-35% calories from fat, 7% or less cal from saturated fats, less than 200mg  cholesterol, less than 1.5gm of sodium, & 5 or more servings of fruits and vegetables daily;Long Term: Adherence to nutrition and physical activity/exercise  program aimed toward attainment of established weight goal;Short Term: Continue to assess and modify interventions until short term weight is achieved;Understanding recommendations for meals to include 15-35% energy as protein, 25-35% energy from fat, 35-60% energy from carbohydrates, less than 200mg  of dietary cholesterol, 20-35 gm of total fiber daily;Understanding of distribution of calorie intake throughout the day with the consumption of 4-5 meals/snacks    Heart Failure Yes    Intervention Provide a combined exercise and nutrition program that is supplemented with education, support and counseling about heart failure. Directed toward relieving symptoms such as shortness of breath, decreased exercise tolerance, and extremity edema.    Expected Outcomes Short term: Attendance in program 2-3 days a week with increased exercise capacity. Reported lower sodium intake. Reported increased fruit and vegetable intake. Reports medication compliance.;Short term: Daily weights obtained and reported for increase. Utilizing diuretic protocols set by physician.;Long term: Adoption of self-care skills and reduction of barriers for early signs and symptoms recognition and intervention leading to self-care maintenance.    Hypertension Yes    Intervention Provide education on lifestyle modifcations including regular physical activity/exercise, weight management, moderate sodium restriction and increased consumption of fresh fruit, vegetables, and low fat dairy, alcohol moderation, and smoking cessation.;Monitor prescription use compliance.    Expected Outcomes Short Term: Continued assessment and intervention until BP is < 140/36mm HG in hypertensive participants. < 130/50mm HG in hypertensive participants with diabetes, heart failure or chronic kidney disease.;Long Term: Maintenance of blood pressure at goal levels.    Personal Goal Other Yes    Personal Goal Pt wants to increase strength, endurance, and feel a little  more normal. He would also like to not be as dependant on his cane.    Intervention Will progress workloads as tolerated without sign or symptom to help pt re-gain strength    Expected Outcomes Pt will achieve his goals           Core Components/Risk Factors/Patient Goals Review:    Core Components/Risk Factors/Patient Goals at Discharge (Final Review):    ITP Comments:  ITP Comments    Row Name 11/13/20 1634 11/17/20 1452         ITP Comments Dr Fransico Him MD, Medical Director 30 Day ITP Review. Truman Hayward did not exercise on 11/17/20 due to low BP to tenatively start on Wednesday             Comments: See ITP comments.[name], RN,BSN 11/18/2020 3:34 PM

## 2020-11-17 NOTE — Telephone Encounter (Signed)
Maria,RN with Cardiac rehab called to report pts bp 94/60 sitting and 89/50 standing. She said pt was a little foggy and did not sleep well last night. She did not allow pt to exercise today. Maria requests bp parameters and asked if there are any medication changes we would like to make.  Routed to [place] for advice

## 2020-11-17 NOTE — Progress Notes (Signed)
Incomplete Session Note  Patient Details  Name: [name] [name] MRN: [id number] Date of Birth: May 18, 1928 Referring Provider:   Flowsheet Row CARDIAC REHAB PHASE II ORIENTATION from 11/13/2020 in [place]  Referring Provider Dr. Peter Martinique, MD      [name] did not complete his rehab session.  BP 94/64 sitting. Standing BP 89/50 via automatic BP cuff. Heart rate 78. Patient said that he drank a cup of coffee and a 1/2 cup of water. Patient denies feeling lightheaded or dizzy currently patient reported feeling foggy initially and reported that he did not sleep well last night.  Recheck BP 84/60. Patient given 10 ounces of water. Recheck sitting BP 98/43. Dr Bensimhon's office called and notified. Spoke with [place]. Advised the patient not to exercise today. Will get parameter's for exercise for Systolic BP. I spoke to the patient's wife. [name] [name] said that Dr Telford Nab primary care provider decreased his losartan to 1/2 of a 25 mg tablet due to having a low BP at her office this past Friday. [name] [name], [name] says that [name] takes his furosemide every other day. Will notify Dr Bensimhon's office about the medication change.Will update medication list.Jessly Lebeck Venetia Maxon, RN,BSN 11/17/2020 2:52 PM

## 2020-11-18 NOTE — Telephone Encounter (Signed)
[name] [name] aware.

## 2020-11-19 ENCOUNTER — Encounter (HOSPITAL_COMMUNITY)
Admission: RE | Admit: 2020-11-19 | Discharge: 2020-11-19 | Disposition: A | Discharge status: Home / Self Care | Payer: Medicare HMO | Source: Ambulatory Visit | Attending: Cardiology | Admitting: Cardiology

## 2020-11-19 ENCOUNTER — Other Ambulatory Visit: Payer: Self-pay

## 2020-11-19 DIAGNOSIS — I5022 Chronic systolic (congestive) heart failure: Secondary | ICD-10-CM | POA: Diagnosis not present

## 2020-11-19 DIAGNOSIS — I2510 Atherosclerotic heart disease of native coronary artery without angina pectoris: Secondary | ICD-10-CM | POA: Diagnosis not present

## 2020-11-19 DIAGNOSIS — I4891 Unspecified atrial fibrillation: Secondary | ICD-10-CM | POA: Diagnosis not present

## 2020-11-19 DIAGNOSIS — Z7901 Long term (current) use of anticoagulants: Secondary | ICD-10-CM | POA: Diagnosis not present

## 2020-11-19 DIAGNOSIS — Z7982 Long term (current) use of aspirin: Secondary | ICD-10-CM | POA: Diagnosis not present

## 2020-11-19 DIAGNOSIS — Z48812 Encounter for surgical aftercare following surgery on the circulatory system: Secondary | ICD-10-CM | POA: Diagnosis not present

## 2020-11-19 DIAGNOSIS — Z952 Presence of prosthetic heart valve: Secondary | ICD-10-CM

## 2020-11-19 DIAGNOSIS — Z951 Presence of aortocoronary bypass graft: Secondary | ICD-10-CM

## 2020-11-19 DIAGNOSIS — Z79899 Other long term (current) drug therapy: Secondary | ICD-10-CM | POA: Diagnosis not present

## 2020-11-19 DIAGNOSIS — N183 Chronic kidney disease, stage 3 unspecified: Secondary | ICD-10-CM | POA: Diagnosis not present

## 2020-11-19 NOTE — Progress Notes (Addendum)
Daily Session Note  Patient Details  Name: [name] [name] MRN: [id number] Date of Birth: 09-Nov-1927 Referring Provider:   Flowsheet Row CARDIAC REHAB PHASE II ORIENTATION from 11/13/2020 in [facility]  Referring Provider Dr. Peter Martinique, MD      Encounter Date: 11/19/2020  Check In:  Session Check In - 11/19/20 1341      Check-In   Supervising physician immediately available to respond to emergencies [facility] immediately available    Physician(s) Dr. Maren Beach    Location [facility]    Staff Present [name], RN, Deland Pretty, MS, ACSM CEP, Exercise Physiologist;Lisa Ysidro Evert, RN;Other;David Lilyan Punt, MS, ACSM-CEP, CCRP, Exercise Physiologist;Jetta Gilford Rile BS, ACSM EP-C, Exercise Physiologist    Virtual Visit No    Medication changes reported     No    Fall or balance concerns reported    No   Patient uses a cane.   Tobacco Cessation No Change    Current number of cigarettes/nicotine per day     0    Warm-up and Cool-down Performed on first and last piece of equipment    Resistance Training Performed No    VAD Patient? No    PAD/SET Patient? No      Pain Assessment   Currently in Pain? No/denies    Pain Score 0-No pain    Multiple Pain Sites No           Capillary Blood Glucose: No results found for this or any previous visit (from the past 24 hour(s)).   Exercise Prescription Changes - 11/19/20 1400      Response to Exercise   Blood Pressure (Admit) 107/60    Blood Pressure (Exercise) 112/76    Blood Pressure (Exit) 100/60    Heart Rate (Admit) 86 bpm    Heart Rate (Exercise) 94 bpm    Heart Rate (Exit) 78 bpm    Rating of Perceived Exertion (Exercise) 13    Symptoms Fatigue (legs)    Comments Pt's first day in hte CRP2 program    Duration Continue with 30 min of aerobic exercise without signs/symptoms of physical distress.    Intensity THRR unchanged      Progression   Progression Continue to  progress workloads to maintain intensity without signs/symptoms of physical distress.    Average METs 1.7      Resistance Training   Training Prescription No    Weight No weights on wednesdays      Interval Training   Interval Training No      NuStep   Level 1    SPM 75    Minutes 20    METs 1.7           Social History   Tobacco Use  Smoking Status Never Smoker  Smokeless Tobacco Never Used    Goals Met:  Exercise tolerated well No report of cardiac concerns or symptoms  Goals Unmet:  Not Applicable  Comments: [name] [name] started cardiac rehab today.  Pt tolerated light exercise without difficulty. VSS, telemetry-Sinus Rhythm first degree heart block, bundle branch block, PVC's, PAC's asymptomatic.  Medication list reconciled. Pt denies barriers to medicaiton compliance.  PSYCHOSOCIAL ASSESSMENT:  PHQ-0. Pt has a supportive wife. .QUALITY OF LIFE SCORE REVIEW  Pt completed Quality of Life survey as a participant in Cardiac Rehab. Scores 21.0 or below are considered low. Pt score very low in several areas Overall 14.85, Health and Function 10.21, socioeconomic 21.63, physiological and spiritual 10.07, family 23.70.  Patient quality of life slightly altered by physical constraints which limits ability to perform as prior to recent cardiac illness. Patient was given a copy of his quality of life questionnaire to review with Dr Addison Lank.  Offered emotional support and reassurance.  Will continue to monitor and intervene as necessary.   .    Pt enjoys reading and spending time with his wife.   Pt oriented to exercise equipment and routine.  Patient uses a cane for stability  Understanding verbalized.[name], RN,BSN 11/19/2020 4:22 PM   Dr. Fransico Him is Medical Director for Cardiac Rehab at [facility].

## 2020-11-21 ENCOUNTER — Encounter (HOSPITAL_COMMUNITY)
Admission: RE | Admit: 2020-11-21 | Discharge: 2020-11-21 | Disposition: A | Discharge status: Home / Self Care | Payer: Medicare HMO | Source: Ambulatory Visit | Attending: Cardiology | Admitting: Cardiology

## 2020-11-21 ENCOUNTER — Other Ambulatory Visit: Payer: Self-pay

## 2020-11-21 DIAGNOSIS — Z7982 Long term (current) use of aspirin: Secondary | ICD-10-CM | POA: Diagnosis not present

## 2020-11-21 DIAGNOSIS — Z952 Presence of prosthetic heart valve: Secondary | ICD-10-CM

## 2020-11-21 DIAGNOSIS — Z79899 Other long term (current) drug therapy: Secondary | ICD-10-CM | POA: Diagnosis not present

## 2020-11-21 DIAGNOSIS — Z951 Presence of aortocoronary bypass graft: Secondary | ICD-10-CM

## 2020-11-21 DIAGNOSIS — I5022 Chronic systolic (congestive) heart failure: Secondary | ICD-10-CM | POA: Diagnosis not present

## 2020-11-21 DIAGNOSIS — Z48812 Encounter for surgical aftercare following surgery on the circulatory system: Secondary | ICD-10-CM | POA: Diagnosis not present

## 2020-11-21 DIAGNOSIS — Z7901 Long term (current) use of anticoagulants: Secondary | ICD-10-CM | POA: Diagnosis not present

## 2020-11-26 ENCOUNTER — Encounter (HOSPITAL_COMMUNITY)
Admission: RE | Admit: 2020-11-26 | Discharge: 2020-11-26 | Disposition: A | Discharge status: Home / Self Care | Payer: Medicare HMO | Source: Ambulatory Visit | Attending: Cardiology | Admitting: Cardiology

## 2020-11-26 ENCOUNTER — Other Ambulatory Visit: Payer: Self-pay

## 2020-11-26 VITALS — Wt 170.0 lb

## 2020-11-26 DIAGNOSIS — Z952 Presence of prosthetic heart valve: Secondary | ICD-10-CM | POA: Insufficient documentation

## 2020-11-26 DIAGNOSIS — Z951 Presence of aortocoronary bypass graft: Secondary | ICD-10-CM | POA: Insufficient documentation

## 2020-11-26 NOTE — Progress Notes (Signed)
[name] [name] 87 y.o. male Nutrition Note  Diagnosis: CABGx4, AVR  Past Medical History:  Diagnosis Date  . Aortic stenosis   . Arthritis   . CAD (coronary artery disease)   . CHF (congestive heart failure) ([place])   . Colitis   . Edema   . Essential hypertension    no med now  . Essential tremor   . Hyperlipidemia   . Hypotonic bladder    L4-L5  . Obstructive uropathy   . Peripheral neuropathy    Elevated by Dr. love 2010  . Prostate cancer ([place]) 2005     Medications reviewed.   Current Outpatient Medications:  .  apixaban (ELIQUIS) 5 MG TABS tablet, Take 1 tablet (5 mg total) by mouth 2 (two) times daily., Disp: 180 tablet, Rfl: 1 .  Ascorbic Acid (VITAMIN C) 100 MG tablet, Take 100 mg by mouth daily., Disp: , Rfl:  .  aspirin 81 MG tablet, Take 81 mg by mouth at bedtime. , Disp: , Rfl:  .  Cholecalciferol (VITAMIN D) 50 MCG (2000 UT) tablet, Take 1 tablet by mouth daily., Disp: , Rfl:  .  docusate sodium (COLACE) 100 MG capsule, Take 100 mg by mouth daily as needed for mild constipation., Disp: , Rfl:  .  dutasteride (AVODART) 0.5 MG capsule, Take 0.5 mg by mouth daily., Disp: , Rfl:  .  eszopiclone (LUNESTA) 1 MG TABS tablet, Take 1-2 mg by mouth at bedtime., Disp: , Rfl:  .  feeding supplement, ENSURE COMPLETE, (ENSURE COMPLETE) LIQD, Take 237 mLs by mouth 2 (two) times daily between meals., Disp: , Rfl:  .  ferrous [id number] C-folic acid (TRINSICON / FOLTRIN) capsule, Take 1 capsule by mouth 2 (two) times daily after a meal., Disp: , Rfl:  .  furosemide (LASIX) 40 MG tablet, Take 40 mg by mouth every other day., Disp: , Rfl:  .  losartan (COZAAR) 25 MG tablet, Take 1 tablet (25 mg total) by mouth at bedtime. (Patient taking differently: Take 12.5 mg by mouth at bedtime. Dose decreased to a 1/2 of a 25 mg tablet By Dr Addison Lank on 11/14/20 due to low BP), Disp: 90 tablet, Rfl: 3 .  melatonin 5 MG TABS, Take 1 tablet (5 mg total) by mouth at bedtime as needed  (sleep)., Disp: , Rfl: 0 .  Multiple Vitamins-Minerals (CENTRUM SILVER PO), Take 1 tablet by mouth daily. , Disp: , Rfl:  .  ondansetron (ZOFRAN) 4 MG tablet, Take 1 tablet (4 mg total) by mouth every 8 (eight) hours as needed for nausea or vomiting., Disp: 20 tablet, Rfl: 0 .  sertraline (ZOLOFT) 50 MG tablet, Take 1 tablet by mouth daily., Disp: , Rfl:  .  sulfaSALAzine (AZULFIDINE) 500 MG tablet, Take 2,000 mg by mouth daily., Disp: , Rfl:  .  tamsulosin (FLOMAX) 0.4 MG CAPS, Take 0.4 mg by mouth 2 (two) times daily. , Disp: , Rfl:  .  vitamin B-12 (CYANOCOBALAMIN) 100 MCG tablet, Take 1 tablet by mouth daily., Disp: , Rfl:    Ht Readings from Last 1 Encounters:  11/13/20 5' 8.5" (1.74 m)     Wt Readings from Last 3 Encounters:  11/13/20 170 lb 10.2 oz (77.4 kg)  11/05/20 167 lb 3.2 oz (75.8 kg)  09/15/20 169 lb (76.7 kg)     There is no height or weight on file to calculate BMI.   Social History   Tobacco Use  Smoking Status Never Smoker  Smokeless Tobacco Never Used  No results found for: CHOL No results found for: HDL No results found for: LDLCALC No results found for: TRIG   Lab Results  Component Value Date   HGBA1C 6.2 (H) 05/16/2020     CBG (last 3)  No results for input(s): GLUCAP in the last 72 hours.   Nutrition Note  Spoke with pt. Pt is hard of hearing and some history was obtained from wife. Nutrition Plan and Nutrition Survey goals reviewed with pt. Pt is following a Heart Healthy diet.  Pt has Pre-diabetes. Last A1c indicates blood glucose well-controlled.  Pt wants to gain weight. Per office notes, his weight has been maintained. He reports daily weights at home. He still has lower leg edema. His wife adds salt to his food when his BP runs low. Otherwise, she tries to keep him on a lower sodium diet. He is eating well - typically 2 meals and 1 snack plus 2 Ensures per day.   Estimated energy needs - 1981 kcals. Per diet recall, pt is meeting  needs. He eats 2 meals per day, 2 snacks, and 2 350 kcal Ensures.   Diet recall: Breakfast:cereal, yogurt, 1 banana, 1 babybell cheese  Lunch/snack: asian noodles or deli sandwich  Dinner: meatballs and spaghetti Snack: fruit, cheesecake  2 Ensures daily  Pt expressed understanding of the information reviewed.    Nutrition Diagnosis ? Food-and nutrition-related knowledge deficit related to lack of exposure to information as related to diagnosis of: ? CVD ? Pre-diabetes  Nutrition Intervention ? Pt's individual nutrition plan reviewed with pt. ? Benefits of adopting Heart Healthy diet discussed when Picture Your Plate reviewed. ? Continue client-centered nutrition education by RD, as part of interdisciplinary care.  Goal(s) ? Pt to build a healthy plate including vegetables, fruits, whole grains, and low-fat dairy products in a heart healthy meal plan. ? Pt to continue limiting sodium (avoiding salt shaker, limiting eating out) Plan:   Will provide client-centered nutrition education as part of interdisciplinary care  Monitor and evaluate progress toward nutrition goal with team.   Michaele Offer, MS, RDN, LDN

## 2020-11-28 ENCOUNTER — Other Ambulatory Visit: Payer: Self-pay

## 2020-11-28 ENCOUNTER — Encounter (HOSPITAL_COMMUNITY)
Admission: RE | Admit: 2020-11-28 | Discharge: 2020-11-28 | Disposition: A | Discharge status: Home / Self Care | Payer: Medicare HMO | Source: Ambulatory Visit | Attending: Cardiology | Admitting: Cardiology

## 2020-11-28 DIAGNOSIS — Z951 Presence of aortocoronary bypass graft: Secondary | ICD-10-CM

## 2020-11-28 DIAGNOSIS — Z952 Presence of prosthetic heart valve: Secondary | ICD-10-CM | POA: Diagnosis not present

## 2020-12-01 ENCOUNTER — Other Ambulatory Visit: Payer: Self-pay

## 2020-12-01 ENCOUNTER — Encounter (HOSPITAL_COMMUNITY)
Admission: RE | Admit: 2020-12-01 | Discharge: 2020-12-01 | Disposition: A | Discharge status: Home / Self Care | Payer: Medicare HMO | Source: Ambulatory Visit | Attending: Cardiology | Admitting: Cardiology

## 2020-12-01 DIAGNOSIS — Z951 Presence of aortocoronary bypass graft: Secondary | ICD-10-CM

## 2020-12-01 DIAGNOSIS — Z952 Presence of prosthetic heart valve: Secondary | ICD-10-CM | POA: Diagnosis not present

## 2020-12-03 ENCOUNTER — Other Ambulatory Visit: Payer: Self-pay

## 2020-12-03 ENCOUNTER — Encounter (HOSPITAL_COMMUNITY)
Admission: RE | Admit: 2020-12-03 | Discharge: 2020-12-03 | Disposition: A | Discharge status: Home / Self Care | Payer: Medicare HMO | Source: Ambulatory Visit | Attending: Cardiology | Admitting: Cardiology

## 2020-12-03 DIAGNOSIS — Z952 Presence of prosthetic heart valve: Secondary | ICD-10-CM

## 2020-12-03 DIAGNOSIS — Z951 Presence of aortocoronary bypass graft: Secondary | ICD-10-CM | POA: Diagnosis not present

## 2020-12-05 ENCOUNTER — Other Ambulatory Visit: Payer: Self-pay | Admitting: *Deleted

## 2020-12-05 ENCOUNTER — Other Ambulatory Visit: Payer: Self-pay

## 2020-12-05 ENCOUNTER — Encounter (HOSPITAL_COMMUNITY)
Admission: RE | Admit: 2020-12-05 | Discharge: 2020-12-05 | Disposition: A | Discharge status: Home / Self Care | Payer: Medicare HMO | Source: Ambulatory Visit | Attending: Cardiology | Admitting: Cardiology

## 2020-12-05 DIAGNOSIS — Z952 Presence of prosthetic heart valve: Secondary | ICD-10-CM | POA: Diagnosis not present

## 2020-12-05 DIAGNOSIS — Z951 Presence of aortocoronary bypass graft: Secondary | ICD-10-CM | POA: Diagnosis not present

## 2020-12-05 NOTE — Patient Outreach (Signed)
[facility]) Care Management  12/05/2020  [name] [name] Feb 08, 1928 [id number]   Outgoing call placed to member/wife, no answer, HIPAA compliant voice message left.  Will follow up within the next 3-4 business days.  [name], [place], MSN [place] [phone]

## 2020-12-05 NOTE — Progress Notes (Signed)
Cardiology Office Note   Date:  12/16/2020   ID:  [name] [name], DOB 1927/10/09, MRN [id number]  PCP:  Cari Caraway, MD  Cardiologist:   Violetta Lavalle Martinique, MD   Chief Complaint  Patient presents with   Congestive Heart Failure       History of Present Illness: [name] [name] is a 87 y.o. male who is seen for follow up CHF, AS. He has a history of venous insufficiency with chronic lower extremity edema,hypertension, hyperlipidemia, peripheral neuropathy, essential tremor, and prostate cancer.   Patient first seen  in 09/2012 for evaluation of dyspnea on exertion and lower extremity edema, especially in his left leg. Venous dopplers were ordered and showed no DVT. Echo showed LVEF of 55-60% with moderate asymmetric basal septal hypertrophy with no LV outflow tract gradient and mild to moderate MR. He was seen  in 07/2017 at which time he was doing well. He denied any chest pain or shortness of breath. He continued to report some lower extremity edema that resolved with elevation of legs at night. Edema felt to be due to venous insufficiency and conservative management with sodium restriction, elevation of legs, and compression stockings was recommended.    He was diagnosed with right middle lobe pneumonia on 10/12/2019 after presenting for further evaluation of fatigue while on vacation at the beach.. He tested negative for COVID. He was started on Levaquin but this had to be stopped when he developed tendonitis. He was started on Azithromycin instead on 10/26/2019. Repeat chest x-ray on 10/26/2019 showed increased density of right lower lobe pneumonia. He had persistent fatigue  and developed significant lower extremity edema and weight gain. Patient was seen by PCP again on 11/14/2019. Repeat chest x-ray showed continued right basilar pneumonia and increased right pleural effusion. BNP was checked and came back elevated at 1,380. He was started on Lasix 42m daily. Chest CT was also ordered to rule out  underlying lung mass. CT was performed on 11/15/2019 and showed mild cardiomegaly, moderate right pleural effusion with adjacent atelectasis or pneumonia in the right lower and middle lobes as well as coronary artery calcifications. Of note, prior to being started on Lasix, he was seen by Urology for urinary retention and had a catheter placed.  This was later removed and he performed in and out cath.   He had an Echo done in June 2021 showing EF 40-45% with severe HK of the entire inferior wall. There was severe basal septal hypertrophy with gr 2 diastolic dysfunction. Moderate MR, mild AS and mild Aortic root enlargement. We started him on losartan 25 mg daily but dose had to be reduced to 12.5 mg due to hypotension. He was seen in September with worsening CHF with weight gain, edema and worsening fatigue. Lasix dose was increased. Echo showed further decline in LV function with EF 30-35%. AS was felt to be moderate to severe ( low flow AS).   He subsequently underwent cardiac cath. This demonstrated severe 3 vessel CAD with complex anatomy as well as low flow severe AS.  He was seen in consultation with Dr BCyndia Bentand underwent combined AVR with a #29 Edwards Inspiris Resilia valve and CABG with LIMA to the LAD, sequential SVG to OM1 and OM2, and SVG to the PDA on 05/23/20.  His post op course was complicated by persistent low output CHF and hypotension. No beta blocker due to hypotension. No digoxin due to some AV block. Was followed inpatient by the Heart failure service. Was on  Dobutamine until Dec 18 then weaned off. He had some post op Afib but converted to NSR. He was placed on midodrine for hypotension. He had significant debility and was evaluated by OT an PT. He was treated for PNA. Repeat Echo showed EF 35% with good AVR prosthesis function. He was DC to SNF on 06/26/20.   Followed in Advanced heart failure clinic. Was felt to be in Afib and DCCV arranged but when he presented to the hospital he was  in NSR.   When last seen we started him on Entresto. He was seen in the Advanced heart failure clinic on 5/11 and was experiencing orthostasis and was switched back to losartan. Amiodarone discontinued. Was on lasix about every other day. Echo was repeated with EF 35-40%. Stable AV prosthesis. Mild to mod MR. Moderate RV dysfunction.   He is clearly doing much better. He is active participating in [place]. He still experiences intermittent hypotension. Is taking lasix every other day. Self caths for urinary retention. Is taking tamsulosin bid. His appetite has returned and he has put weight back on without significant edema. No chest pain or palpitations.     Past Medical History:  Diagnosis Date   Aortic stenosis    Arthritis    CAD (coronary artery disease)    CHF (congestive heart failure) (HCC)    Colitis    Edema    Essential hypertension    no med now   Essential tremor    Hyperlipidemia    Hypotonic bladder    L4-L5   Obstructive uropathy    Peripheral neuropathy    Elevated by Dr. love 2010   Prostate cancer [facility]) 2005    Past Surgical History:  Procedure Laterality Date   ACHILLES TENDON SURGERY Bilateral 1995   AORTIC VALVE REPLACEMENT N/A 05/23/2020   Procedure: AORTIC VALVE REPLACEMENT (AVR) USING EDWARDS RESILIA 29 MM AORTIC VALVE.;  Surgeon: Gaye Pollack, MD;  Location: [facility];  Service: Open Heart Surgery;  Laterality: N/A;   CARDIAC CATHETERIZATION     COLONOSCOPY     With polyp resection   CORONARY ARTERY BYPASS GRAFT N/A 05/23/2020   Procedure: CORONARY ARTERY BYPASS GRAFTING (CABG) USING LIMA to LAD; ENDOSCOPIC HARVESTED RIGHT GREATER SAPHENOUS VEIN: SVG to PD; SVG to SEQUENCED INTERMEDIATE to OM;  Surgeon: Gaye Pollack, MD;  Location: [place];  Service: Open Heart Surgery;  Laterality: N/A;   ENDOVEIN HARVEST OF GREATER SAPHENOUS VEIN Right 05/23/2020   Procedure: ENDOVEIN HARVEST OF GREATER SAPHENOUS VEIN;  Surgeon: Gaye Pollack, MD;  Location:  [place];  Service: Open Heart Surgery;  Laterality: Right;   REVERSE SHOULDER ARTHROPLASTY Right 06/04/2016   REVERSE SHOULDER ARTHROPLASTY Right 06/04/2016   Procedure: REVERSE SHOULDER ARTHROPLASTY;  Surgeon: Netta Cedars, MD;  Location: [place];  Service: Orthopedics;  Laterality: Right;   RIGHT/LEFT HEART CATH AND CORONARY ANGIOGRAPHY N/A 05/20/2020   Procedure: RIGHT/LEFT HEART CATH AND CORONARY ANGIOGRAPHY;  Surgeon: [name], Kemyah Buser M, MD;  Location: [place] [facility];  Service: Cardiovascular;  Laterality: N/A;   SKIN CANCER EXCISION  1974   On chest wall   TEE WITHOUT CARDIOVERSION N/A 05/23/2020   Procedure: TRANSESOPHAGEAL ECHOCARDIOGRAM (TEE);  Surgeon: Gaye Pollack, MD;  Location: [place];  Service: Open Heart Surgery;  Laterality: N/A;   TOTAL KNEE ARTHROPLASTY Right 2011   TOTAL KNEE ARTHROPLASTY Left 04/09/2013   Procedure: LEFT TOTAL KNEE ARTHROPLASTY;  Surgeon: Gearlean Alf, MD;  Location: [facility];  Service: Orthopedics;  Laterality: Left;     Current Outpatient Medications  Medication Sig Dispense Refill   apixaban (ELIQUIS) 5 MG TABS tablet Take 1 tablet (5 mg total) by mouth 2 (two) times daily. 180 tablet 1   Ascorbic Acid (VITAMIN C) 100 MG tablet Take 100 mg by mouth daily.     aspirin 81 MG tablet Take 81 mg by mouth at bedtime.      Cholecalciferol (VITAMIN D) 50 MCG (2000 UT) tablet Take 1 tablet by mouth daily.     docusate sodium (COLACE) 100 MG capsule Take 100 mg by mouth daily as needed for mild constipation.     dutasteride (AVODART) 0.5 MG capsule Take 0.5 mg by mouth daily.     feeding supplement, ENSURE COMPLETE, (ENSURE COMPLETE) LIQD Take 237 mLs by mouth 2 (two) times daily between meals.     ferrous [id number] C-folic acid (TRINSICON / FOLTRIN) capsule Take 1 capsule by mouth 2 (two) times daily after a meal.     furosemide (LASIX) 40 MG tablet Take 40 mg by mouth every other day.     losartan (COZAAR) 25 MG tablet Take 1 tablet (25 mg total)  by mouth at bedtime. (Patient taking differently: Take 12.5 mg by mouth at bedtime. Dose decreased to a 1/2 of a 25 mg tablet By Dr Addison Lank on 11/14/20 due to low BP) 90 tablet 3   melatonin 5 MG TABS Take 1 tablet (5 mg total) by mouth at bedtime as needed (sleep).  0   Multiple Vitamins-Minerals (CENTRUM SILVER PO) Take 1 tablet by mouth daily.      sertraline (ZOLOFT) 50 MG tablet Take 1 tablet by mouth daily.     sulfaSALAzine (AZULFIDINE) 500 MG tablet Take 2,000 mg by mouth daily.     tamsulosin (FLOMAX) 0.4 MG CAPS Take 0.4 mg by mouth 2 (two) times daily.      vitamin B-12 (CYANOCOBALAMIN) 100 MCG tablet Take 1 tablet by mouth daily.     No current facility-administered medications for this visit.    Allergies:   Simvastatin    Social History:  The patient  reports that he has never smoked. He has never used smokeless tobacco. He reports current alcohol use of about 1.0 standard drink of alcohol per week. He reports that he does not use drugs.   Family History:  The patient's family history includes Diabetes in his mother; Heart attack in his brother and maternal aunt; Heart failure in his mother; Hypertension in his mother.    ROS:  Please see the history of present illness.   Otherwise, review of systems are positive for none.   All other systems are reviewed and negative.    PHYSICAL EXAM: VS:  BP (!) 98/54 (BP Location: Right Arm, Patient Position: Sitting, Cuff Size: Normal)   Pulse 80   Ht 5' 9.25" (1.759 m)   Wt 182 lb (82.6 kg)   SpO2 95%   BMI 26.68 kg/m  , BMI Body mass index is 26.68 kg/m. GEN: elderly WM, well developed, in no acute distress  HEENT: normal  Neck: + JVD to 8 cm, no carotid bruits, or masses Cardiac: RRR; gr 1/6 systolic murmur at the RUSB. No  rubs, or gallops.  Respiratory:  Bilateral reduced BS with dullness to percussion.  GI  soft, nontender, nondistended, + BS MS: no deformity or atrophy  Skin: warm and dry, no rash Ext: 2+ LE edema  pretibial Neuro:  Strength and sensation are intact Psych: euthymic mood,  full affect   EKG:  EKG is not ordered today. The ekg ordered today demonstrates N/A   Recent Labs: 01/02/2020: Pro B Natriuretic peptide (BNP) 1,167.0 06/02/2020: ALT 20 06/13/2020: TSH 1.920 06/23/2020: Magnesium 2.2 11/05/2020: B Natriuretic Peptide 757.0; BUN 38; Creatinine, Ser 0.99; Hemoglobin 12.2; Platelets 143; Potassium 4.7; Sodium 135    Lipid Panel No results found for: CHOL, TRIG, HDL, CHOLHDL, VLDL, LDLCALC, LDLDIRECT    Wt Readings from Last 3 Encounters:  12/16/20 182 lb (82.6 kg)  12/15/20 175 lb 0.7 oz (79.4 kg)  11/26/20 170 lb (77.1 kg)    - 10/12/2019: WBC 7.6, Hgb 14.2, Plts 246. Na 140, K 4.4, Glucose 103, BUN 12, Cr 1.26. AST 29, ALT 21, Alk Phos 61, Total Bili 0.5. TSH 0.849. UA showed 75m of protein but was otherwise unremarkable.  - 10/26/2019: Cr 1.20.  - 11/14/2019: BNP 1,380. Na 140, K 4.6, Glucose 107, BUN 22, Cr 1.16. AST 26, ALT, Alk Phos 59.  Dated 12/06/19: BUN 27, creatinine 1.4. CBC and CMET normal.  Labs dated1/20/22: BUN 18, creatinine 1.2. Lytes OK. Hgb 11.1. normal WBC. BNP 2010.   CXR report 07/15/20: moderate bilateral pleural effusions.   Other studies Reviewed: Additional studies/ records that were reviewed today include:  Echo 12/10/19: IMPRESSIONS     1. Left ventricular ejection fraction, by estimation, is 40 to 45%. The  left ventricle has mildly decreased function. The left ventricle  demonstrates global hypokinesis. There is severe left ventricular  hypertrophy of the basal-septal segment. Left  ventricular diastolic parameters are consistent with Grade II diastolic  dysfunction (pseudonormalization). There is severe hypokinesis of the left  ventricular, entire inferior wall and inferoseptal wall.   2. Right ventricular systolic function is normal. The right ventricular  size is mildly enlarged. There is normal pulmonary artery systolic  pressure. The  estimated right ventricular systolic pressure is 347.6mmHg.   3. Left atrial size was mild to moderately dilated.   4. The mitral valve is normal in structure. Moderate mitral valve  regurgitation. No evidence of mitral stenosis.   5. The aortic valve is tricuspid. Aortic valve regurgitation is not  visualized. Mild aortic valve stenosis. Aortic valve area, by VTI measures  1.51 cm. Aortic valve mean gradient measures 20.0 mmHg. Aortic valve Vmax  measures 2.46 m/s. The degree of AS   may be underestimated in the setting of LV dysfunction. rree-=--   6. Aortic dilatation noted. There is mild dilatation of the aortic root  and of the ascending aorta measuring 42 mm and 471mrespectively.   7. The inferior vena cava is dilated in size with >50% respiratory  variability, suggesting right atrial pressure of 8 mmHg.    Echo 04/01/20: IMPRESSIONS     1. LVEF has decreased from 12/10/2019, now 30-35% with LV dyssynchrony.  Aortic stenosis is most probably severe with low flow low gradient,  dimensionless index 0.26.   2. Left ventricular ejection fraction, by estimation, is 30 to 35%. The  left ventricle has moderately decreased function. The left ventricle  demonstrates global hypokinesis. The left ventricular internal cavity size  was mildly dilated. There is severe  asymmetric left ventricular hypertrophy of the basal-septal segment. Left  ventricular diastolic function could not be evaluated.   3. Right ventricular systolic function is mildly reduced. The right  ventricular size is mildly enlarged. There is moderately elevated  pulmonary artery systolic pressure. The estimated right ventricular  systolic pressure is 5554.6mHg.   4. Left  atrial size was severely dilated.   5. Right atrial size was moderately dilated.   6. The mitral valve is normal in structure. Moderate mitral valve  regurgitation. No evidence of mitral stenosis.   7. The aortic valve is normal in structure. Aortic  valve regurgitation is  not visualized. Moderate to severe aortic valve stenosis. Aortic valve  mean gradient measures 24.0 mmHg.   8. Aortic dilatation noted. There is mild to moderate dilatation of the  ascending aorta, measuring 44 mm.   9. The inferior vena cava is dilated in size with <50% respiratory  variability, suggesting right atrial pressure of 15 mmHg.   Echo 11/05/20: IMPRESSIONS     1. Left ventricular ejection fraction, by estimation, is 35 to 40%. The  left ventricle has moderately decreased function. The left ventricle  demonstrates regional wall motion abnormalities (see scoring  diagram/findings for description). There is  moderate left ventricular hypertrophy. Left ventricular diastolic  parameters are consistent with Grade I diastolic dysfunction (impaired  relaxation). There is akinesis of the left ventricular, entire inferior  wall.   2. Right ventricular systolic function is moderately reduced. The right  ventricular size is moderately enlarged. There is normal pulmonary artery  systolic pressure.   3. Left atrial size was severely dilated.   4. Right atrial size was severely dilated.   5. The mitral valve is normal in structure. Mild to moderate mitral valve  regurgitation. No evidence of mitral stenosis.   6. The aortic valve has been repaired/replaced. Aortic valve  regurgitation is trivial. No aortic stenosis is present. There is a 29 mm  Edwards bioprosthetic valve present in the aortic position. Procedure  Date: 05/23/2020. Echo findings are consistent  with normal structure and function of the aortic valve prosthesis.   7. Aortic dilatation noted. There is mild dilatation of the aortic root,  measuring 42 mm. There is mild dilatation of the ascending aorta,  measuring 43 mm.   8. The inferior vena cava is normal in size with greater than 50%  respiratory variability, suggesting right atrial pressure of 3 mmHg.   ASSESSMENT AND PLAN:  1. Chronic  combined systolic/diastolic CHF. EF 30-35%.  Patient is s/p AVR for severe AS and CABG for severe multivessel CAD. Prolonged hospital course due to persistent CHF with low output.  - clinically is doing much better and progressing well with cardiac rehab.  - now on lasix every other day.  - intolerant of Entresto due to low BP. On low dose losartan  -daily weight - sodium and fluid restriction. - not a candidate for dig due to AV block.  - poor candidate for SGLT-2 inhibitor due to urinary retention.    2. Severe low flow AS.  - s/p AVR. Follow up Echo showed good valve function.    3. CAD severe s/p CABG. No active angina.   4. Post op Afib. Now off amiodarone. Appears to be regular rhythm today.  5. Severe deconditioning improving.   6.  Hyperlipidemia - statin therapy held post op due to severe deconditioning. Will plan to resume once physical condition improved.  7. CKD. Stage 3a   Follow up in 6 months.  Signed, [name] Dimaria Martinique, MD  12/16/2020 1:42 PM    [facility] [address], [place], [place], [address] Phone [phone], Fax [phone]

## 2020-12-08 ENCOUNTER — Other Ambulatory Visit: Payer: Self-pay

## 2020-12-08 ENCOUNTER — Encounter (HOSPITAL_COMMUNITY)
Admission: RE | Admit: 2020-12-08 | Discharge: 2020-12-08 | Disposition: A | Discharge status: Home / Self Care | Payer: Medicare HMO | Source: Ambulatory Visit | Attending: Cardiology | Admitting: Cardiology

## 2020-12-08 DIAGNOSIS — Z952 Presence of prosthetic heart valve: Secondary | ICD-10-CM | POA: Diagnosis not present

## 2020-12-08 DIAGNOSIS — R339 Retention of urine, unspecified: Secondary | ICD-10-CM | POA: Diagnosis not present

## 2020-12-08 DIAGNOSIS — Z951 Presence of aortocoronary bypass graft: Secondary | ICD-10-CM

## 2020-12-10 ENCOUNTER — Telehealth: Payer: Self-pay | Admitting: Student

## 2020-12-10 ENCOUNTER — Other Ambulatory Visit: Payer: Self-pay | Admitting: *Deleted

## 2020-12-10 ENCOUNTER — Encounter (HOSPITAL_COMMUNITY)
Admission: RE | Admit: 2020-12-10 | Discharge: 2020-12-10 | Disposition: A | Discharge status: Home / Self Care | Payer: Medicare HMO | Source: Ambulatory Visit | Attending: Cardiology | Admitting: Cardiology

## 2020-12-10 ENCOUNTER — Other Ambulatory Visit: Payer: Self-pay

## 2020-12-10 DIAGNOSIS — Z952 Presence of prosthetic heart valve: Secondary | ICD-10-CM

## 2020-12-10 DIAGNOSIS — Z951 Presence of aortocoronary bypass graft: Secondary | ICD-10-CM

## 2020-12-10 NOTE — Patient Outreach (Signed)
[place] [facility]) Care Management  12/10/2020  Wilbert Hayashi 1927-07-19 [id number]   Outreach attempt #2, unsuccessful, HIPAA compliant voice message left.  Will send outreach letter and follow up within the next 3-4 business days.  [name], [place], MSN [place] [phone]

## 2020-12-10 NOTE — Progress Notes (Signed)
Incomplete Session Note  Patient Details  Name: [name] [name] MRN: [id number] Date of Birth: [birth year] Referring Provider:   Flowsheet Row CARDIAC REHAB PHASE II ORIENTATION from 11/13/2020 in [place]  Referring Provider Dr. Peter Martinique, MD       [name] [name] did not complete his rehab session.  Manual BP 70/40 via auto bp cuff  BP 77/42 heart rate 81. Pt asymptomatic. Patient said that he woke up late. Oxygen saturation 96% on room air. Patient said that he ate brunch at 12:00. Patient said that he ate eggs, a bagel and sausage. Patient given 10 ounces of water and did not exercise today due to low BP. Feet elevated Repeat BP 82/49 heart rate 81. Patient given another 10 ounces of water. Recheck BP 88/58 heart rate 66. Patient given another 10 ounces of water. Sande Rives [facility] paged and notified. Instructed patient to hold his losartan and his furosemide if due today. Patient states understanding. Will also notify wife. Will tell patient's wife [name] [name] to monitor the patient's blood pressure at home and to call the heart failure clinic/ Dr Doug Sou office if the patient continues to have a low BP at home. Jasmine at the heart failure clinic notified. Repeat and exit BP 118/60. Patient's wife notified about today's instructions. [name] does not take his furosemide until tomorrow. [name], RN,BSN 12/10/2020 2:28 PM

## 2020-12-10 NOTE — Telephone Encounter (Signed)
   Received a page from [place], Verdis Frederickson, about low BP. Patient presented to cardiac rehab today and BP was 70/40 on automatic machine (77/42 manually). Patient was given oral hydration and BP improved to 88/58. Patient completely asymptomatic. Patient takes low dose Losartan 12.5mg  at bedtime and Lasix 40mg  every other day. Advised patient to hold tonights dose of Losartan as well as Lasix if he is scheduled to take it tonight (wife manages patient's medications so patient was not sure). Also recommended patient continue to monitor BP at home and notify Dr. Clayborne Dana office if BP remains low. Verdis Frederickson will notify patient's wife of these recommendations.  Darreld Mclean, PA-C 12/10/2020 2:22 PM

## 2020-12-12 ENCOUNTER — Other Ambulatory Visit: Payer: Self-pay

## 2020-12-12 ENCOUNTER — Encounter (HOSPITAL_COMMUNITY)
Admission: RE | Admit: 2020-12-12 | Discharge: 2020-12-12 | Disposition: A | Discharge status: Home / Self Care | Payer: Medicare HMO | Source: Ambulatory Visit | Attending: Cardiology | Admitting: Cardiology

## 2020-12-12 DIAGNOSIS — Z952 Presence of prosthetic heart valve: Secondary | ICD-10-CM | POA: Diagnosis not present

## 2020-12-12 DIAGNOSIS — Z951 Presence of aortocoronary bypass graft: Secondary | ICD-10-CM | POA: Diagnosis not present

## 2020-12-15 ENCOUNTER — Other Ambulatory Visit: Payer: Self-pay

## 2020-12-15 ENCOUNTER — Encounter (HOSPITAL_COMMUNITY)
Admission: RE | Admit: 2020-12-15 | Discharge: 2020-12-15 | Disposition: A | Discharge status: Home / Self Care | Payer: Medicare HMO | Source: Ambulatory Visit | Attending: Cardiology | Admitting: Cardiology

## 2020-12-15 VITALS — Wt 175.0 lb

## 2020-12-15 DIAGNOSIS — Z951 Presence of aortocoronary bypass graft: Secondary | ICD-10-CM | POA: Diagnosis not present

## 2020-12-15 DIAGNOSIS — Z952 Presence of prosthetic heart valve: Secondary | ICD-10-CM | POA: Diagnosis not present

## 2020-12-16 ENCOUNTER — Ambulatory Visit (INDEPENDENT_AMBULATORY_CARE_PROVIDER_SITE_OTHER): Payer: Medicare HMO | Admitting: Cardiology

## 2020-12-16 ENCOUNTER — Encounter: Payer: Self-pay | Admitting: Cardiology

## 2020-12-16 ENCOUNTER — Other Ambulatory Visit: Payer: Self-pay | Admitting: *Deleted

## 2020-12-16 VITALS — BP 98/54 | HR 80 | Ht 69.25 in | Wt 182.0 lb

## 2020-12-16 DIAGNOSIS — Z951 Presence of aortocoronary bypass graft: Secondary | ICD-10-CM

## 2020-12-16 DIAGNOSIS — I480 Paroxysmal atrial fibrillation: Secondary | ICD-10-CM | POA: Diagnosis not present

## 2020-12-16 DIAGNOSIS — I350 Nonrheumatic aortic (valve) stenosis: Secondary | ICD-10-CM

## 2020-12-16 DIAGNOSIS — I5042 Chronic combined systolic (congestive) and diastolic (congestive) heart failure: Secondary | ICD-10-CM | POA: Diagnosis not present

## 2020-12-16 DIAGNOSIS — Z952 Presence of prosthetic heart valve: Secondary | ICD-10-CM | POA: Diagnosis not present

## 2020-12-16 DIAGNOSIS — N1831 Chronic kidney disease, stage 3a: Secondary | ICD-10-CM | POA: Diagnosis not present

## 2020-12-16 DIAGNOSIS — I2510 Atherosclerotic heart disease of native coronary artery without angina pectoris: Secondary | ICD-10-CM | POA: Diagnosis not present

## 2020-12-16 NOTE — Patient Outreach (Signed)
[place] [facility]) Care Management  12/16/2020  Jacky Dross 1927/08/09 [id number]   Outreach attempt #3, unsuccessful, HIPAA compliant voice message left. Will make 4th and final attempt within the next 4 weeks.  If remain unsuccessful will close case due to inability to maintain contact.    Update:  Incoming call received back from member's wife, state member has been doing much better since starting cardiac rehab.  Blood pressure much better with a slight increase in sodium and fluid intake.  Denies any urgent concerns, encouraged to contact this care manager with questions.  Agrees to follow up within the next month.   Goals Addressed             This Visit's Progress    [facility] - Improve My Heart Health-Coronary Artery Disease   On track    Timeframe:  Long-Range Goal Priority:  High Start Date:        4/7  (date reset)            Expected End Date:       7/7     Barriers: Knowledge    - be open to making changes - learn about small changes that will make a big difference    Why is this important?   Lifestyle changes are key to improving the blood flow to your heart. Think about the things you can change and set a goal to live healthy.  Remember, when the blood vessels to your heart start to get clogged you may not have any symptoms.  Over time, they can get worse.  Don't ignore the signs, like chest pain, and get help right away.     Notes:   Encouraged to contact MD with any potential complications  3/4 - Discussed medication changes with wife, encouraged to monitor BP, HR, and weights daily  4/7 - Discussed plan to have member participate in cardiac rehab  5/13 - Reviewed blood pressure trends, remain low (90-110s/60-70's).  Confirms he is building endurance, will start rehab next week  6/21 - Remains active with cardiac rehab, next session tomorrow.        COMPLETED: [facility] - Make and Keep All Appointments   On track    Timeframe:  Short-Term  Goal Priority:  Medium Start Date:         5/13        Expected End Date:     6/13  Barriers: Knowledge          - ask family or friend for a ride - call to cancel if needed - keep a calendar with prescription refill dates    Why is this important?   Part of staying healthy is seeing the doctor for follow-up care.  If you forget your appointments, there are some things you can do to stay on track.    Notes:   2/9 - Upcoming appointments reviewed with wife  2/25 - HF clinic visit reviewed with wife, reminded of upcoming appointments  3/4 - Reviewed PCP visit, discussed follow up.  Encouraged to reschedule if needed  4/7 - Has visit with surgeon on 4/14, assessed need for transportation assistance  5/13 - Confirmed member attended appointment with cardiology, had ECHO completed.  Now has evaluation scheduled for next week with Cardiac rehab  6/21- Has remained compliant with appointments, cardiology visit today         [name], RN, MSN [place] Manager [phone]

## 2020-12-16 NOTE — Progress Notes (Signed)
Cardiac Individual Treatment Plan  Patient Details  Name: [name] [name] MRN: [id number] Date of Birth: 1928/01/07 Referring Provider:   Flowsheet Row CARDIAC REHAB PHASE II ORIENTATION from 11/13/2020 in [place]  Referring Provider Dr. Peter Martinique, MD       Initial Encounter Date:  [place] PHASE II ORIENTATION from 11/13/2020 in [place]  Date 11/13/20       Visit Diagnosis: 05/23/20 S/P CABG x 4  05/23/20 AVR  Patient's Home Medications on Admission:  Current Outpatient Medications:    apixaban (ELIQUIS) 5 MG TABS tablet, Take 1 tablet (5 mg total) by mouth 2 (two) times daily., Disp: 180 tablet, Rfl: 1   Ascorbic Acid (VITAMIN C) 100 MG tablet, Take 100 mg by mouth daily., Disp: , Rfl:    aspirin 81 MG tablet, Take 81 mg by mouth at bedtime. , Disp: , Rfl:    Cholecalciferol (VITAMIN D) 50 MCG (2000 UT) tablet, Take 1 tablet by mouth daily., Disp: , Rfl:    docusate sodium (COLACE) 100 MG capsule, Take 100 mg by mouth daily as needed for mild constipation., Disp: , Rfl:    dutasteride (AVODART) 0.5 MG capsule, Take 0.5 mg by mouth daily., Disp: , Rfl:    feeding supplement, ENSURE COMPLETE, (ENSURE COMPLETE) LIQD, Take 237 mLs by mouth 2 (two) times daily between meals., Disp: , Rfl:    ferrous [id number] C-folic acid (TRINSICON / FOLTRIN) capsule, Take 1 capsule by mouth 2 (two) times daily after a meal., Disp: , Rfl:    furosemide (LASIX) 40 MG tablet, Take 40 mg by mouth every other day., Disp: , Rfl:    losartan (COZAAR) 25 MG tablet, Take 1 tablet (25 mg total) by mouth at bedtime. (Patient taking differently: Take 12.5 mg by mouth at bedtime. Dose decreased to a 1/2 of a 25 mg tablet By Dr Addison Lank on 11/14/20 due to low BP), Disp: 90 tablet, Rfl: 3   melatonin 5 MG TABS, Take 1 tablet (5 mg total) by mouth at bedtime as needed (sleep)., Disp: , Rfl: 0   Multiple  Vitamins-Minerals (CENTRUM SILVER PO), Take 1 tablet by mouth daily. , Disp: , Rfl:    sertraline (ZOLOFT) 50 MG tablet, Take 1 tablet by mouth daily., Disp: , Rfl:    sulfaSALAzine (AZULFIDINE) 500 MG tablet, Take 2,000 mg by mouth daily., Disp: , Rfl:    tamsulosin (FLOMAX) 0.4 MG CAPS, Take 0.4 mg by mouth 2 (two) times daily. , Disp: , Rfl:    vitamin B-12 (CYANOCOBALAMIN) 100 MCG tablet, Take 1 tablet by mouth daily., Disp: , Rfl:   Past Medical History: Past Medical History:  Diagnosis Date   Aortic stenosis    Arthritis    CAD (coronary artery disease)    CHF (congestive heart failure) (HCC)    Colitis    Edema    Essential hypertension    no med now   Essential tremor    Hyperlipidemia    Hypotonic bladder    L4-L5   Obstructive uropathy    Peripheral neuropathy    Elevated by Dr. love 2010   Prostate cancer [facility]) 2005    Tobacco Use: Social History   Tobacco Use  Smoking Status Never  Smokeless Tobacco Never    Labs: Recent Review Flowsheet Data     Labs for ITP Cardiac and Pulmonary Rehab Latest Ref Rng & Units 06/12/2020 06/13/2020 06/14/2020 06/15/2020 06/16/2020   Hemoglobin  A1c 4.8 - 5.6 % - - - - -   PHART 7.350 - 7.450 - - - - -   PCO2ART 32.0 - 48.0 mmHg - - - - -   HCO3 20.0 - 28.0 mmol/L - - - - -   TCO2 22 - 32 mmol/L - - - - -   ACIDBASEDEF 0.0 - 2.0 mmol/L - - - - -   O2SAT % 62.0 74.9 75.8 70.7 64.7       Capillary Blood Glucose: Lab Results  Component Value Date   GLUCAP 115 (H) 06/23/2020   GLUCAP 106 (H) 06/23/2020   GLUCAP 105 (H) 06/22/2020   GLUCAP 143 (H) 06/22/2020   GLUCAP 155 (H) 06/22/2020     Exercise Target Goals: Exercise Program Goal: Individual exercise prescription set using results from initial 6 min walk test and THRR while considering  patient's activity barriers and safety.   Exercise Prescription Goal: Initial exercise prescription builds to 30-45 minutes a day of aerobic activity, 2-3 days per week.  Home  exercise guidelines will be given to patient during program as part of exercise prescription that the participant will acknowledge.  Activity Barriers & Risk Stratification:  Activity Barriers & Cardiac Risk Stratification - 11/13/20 1621       Activity Barriers & Cardiac Risk Stratification   Activity Barriers Decreased Ventricular Function;Balance Concerns;Left Knee Replacement;Right Knee Replacement;Shortness of Breath;Deconditioning;History of Falls;Assistive Device    Cardiac Risk Stratification High             6 Minute Walk:  6 Minute Walk     Row Name 11/13/20 1619         6 Minute Walk   Phase Initial     Distance 1214 feet  NuStep Stepper test     Walk Time 6 minutes     # of Rest Breaks 0     MPH 2.3     METS 1.9     RPE 10     Perceived Dyspnea  1     VO2 Peak 6.66     Symptoms No     Resting HR 72 bpm     Resting BP 110/69     Resting Oxygen Saturation  98 %     Exercise Oxygen Saturation  during 6 min walk 95 %     Max Ex. HR 113 bpm     Max Ex. BP 128/71     2 Minute Post BP 102/66              Oxygen Initial Assessment:   Oxygen Re-Evaluation:   Oxygen Discharge (Final Oxygen Re-Evaluation):   Initial Exercise Prescription:  Initial Exercise Prescription - 11/13/20 1600       Date of Initial Exercise RX and Referring Provider   Date 11/13/20    Referring Provider Dr. Peter Martinique, MD    Expected Discharge Date 01/09/21      NuStep   Level 1    SPM 65    Minutes 20    METs 1.2      Prescription Details   Frequency (times per week) 3    Duration Progress to 30 minutes of continuous aerobic without signs/symptoms of physical distress      Intensity   THRR 40-80% of Max Heartrate 52-104    Ratings of Perceived Exertion 11-13    Perceived Dyspnea 0-4      Progression   Progression Continue progressive overload as per policy without signs/symptoms or physical distress.  Resistance Training   Training Prescription Yes     Weight 4    Reps 10-15             Perform Capillary Blood Glucose checks as needed.  Exercise Prescription Changes:   Exercise Prescription Changes     Row Name 11/19/20 1400 12/03/20 1400 12/15/20 1342         Response to Exercise   Blood Pressure (Admit) 107/60 96/62 120/60     Blood Pressure (Exercise) 112/76 111/67 122/72     Blood Pressure (Exit) 100/60 124/79 120/76     Heart Rate (Admit) 86 bpm 89 bpm 83 bpm     Heart Rate (Exercise) 94 bpm 101 bpm 103 bpm     Heart Rate (Exit) 78 bpm 71 bpm 83 bpm     Rating of Perceived Exertion (Exercise) 13 11 11      Symptoms Fatigue (legs) None None     Comments Pt's first day in hte CRP2 program Reviewed METs Increased duration from 25 to 30 minutes.     Duration Continue with 30 min of aerobic exercise without signs/symptoms of physical distress. Continue with 30 min of aerobic exercise without signs/symptoms of physical distress. Continue with 30 min of aerobic exercise without signs/symptoms of physical distress.     Intensity THRR unchanged THRR unchanged THRR unchanged           Progression       Progression Continue to progress workloads to maintain intensity without signs/symptoms of physical distress. Continue to progress workloads to maintain intensity without signs/symptoms of physical distress. Continue to progress workloads to maintain intensity without signs/symptoms of physical distress.     Average METs 1.7 1.8 2           Resistance Training       Training Prescription No No Yes     Weight No weights on wednesdays No weights on wednesdays 2 lbs     Reps -- -- 10-15     Time -- -- 10 Minutes           Interval Training       Interval Training No No No           NuStep       Level 1 1 2      SPM 75 75 75     Minutes 20 25 30      METs 1.7 1.8 2             Exercise Comments:   Exercise Comments     Row Name 11/19/20 1430 12/03/20 1450 12/17/20 1409       Exercise Comments Pt's first day  in the CRP2 program. Pt tolerated session well and had only complaint of leg fatigue. Reviewed METs. Pt making slow progress in the CRP2 program. Good attendence. Pt voices he is geting "a little stonger." Reviewed METs and goals with patient.              Exercise Goals and Review:   Exercise Goals     Row Name 11/13/20 1622             Exercise Goals   Increase Physical Activity Yes       Intervention Provide advice, education, support and counseling about physical activity/exercise needs.;Develop an individualized exercise prescription for aerobic and resistive training based on initial evaluation findings, risk stratification, comorbidities and participant's personal goals.       Expected Outcomes Short Term: Attend rehab on a  regular basis to increase amount of physical activity.;Long Term: Add in home exercise to make exercise part of routine and to increase amount of physical activity.;Long Term: Exercising regularly at least 3-5 days a week.       Increase Strength and Stamina Yes       Intervention Provide advice, education, support and counseling about physical activity/exercise needs.;Develop an individualized exercise prescription for aerobic and resistive training based on initial evaluation findings, risk stratification, comorbidities and participant's personal goals.       Expected Outcomes Short Term: Increase workloads from initial exercise prescription for resistance, speed, and METs.;Short Term: Perform resistance training exercises routinely during rehab and add in resistance training at home;Long Term: Improve cardiorespiratory fitness, muscular endurance and strength as measured by increased METs and functional capacity (6MWT)       Able to understand and use rate of perceived exertion (RPE) scale Yes       Intervention Provide education and explanation on how to use RPE scale       Expected Outcomes Short Term: Able to use RPE daily in rehab to express subjective  intensity level;Long Term:  Able to use RPE to guide intensity level when exercising independently       Knowledge and understanding of Target Heart Rate Range (THRR) Yes       Intervention Provide education and explanation of THRR including how the numbers were predicted and where they are located for reference       Expected Outcomes Short Term: Able to state/look up THRR;Long Term: Able to use THRR to govern intensity when exercising independently;Short Term: Able to use daily as guideline for intensity in rehab       Understanding of Exercise Prescription Yes       Intervention Provide education, explanation, and written materials on patient's individual exercise prescription       Expected Outcomes Short Term: Able to explain program exercise prescription;Long Term: Able to explain home exercise prescription to exercise independently                Exercise Goals Re-Evaluation :  Exercise Goals Re-Evaluation     [place] Name 11/19/20 1429 12/17/20 1409           Exercise Goal Re-Evaluation   Exercise Goals Review Increase Physical Activity;Increase Strength and Stamina;Able to understand and use rate of perceived exertion (RPE) scale;Knowledge and understanding of Target Heart Rate Range (THRR);Understanding of Exercise Prescription Increase Physical Activity;Increase Strength and Stamina;Able to understand and use rate of perceived exertion (RPE) scale;Knowledge and understanding of Target Heart Rate Range (THRR);Understanding of Exercise Prescription      Comments Pt's first day in the CRP2 program. Pt understnads the THRR, exercise RX, and RPE scale. Patient is walking 20 times around his kitchen, which takes him 6-7 minutes, 2-3 times/day. Patient is also doing some resistance exercises at home. Patient's goal is to increase strength and stamina in his arms and legs and to have "better control" of his arms and legs. Patient wants to be able to get out of a chair independently.       Expected Outcomes Will continue to monitor patient and progress as tolerated. Progress workloads as tolerated to help achieve personal health and fitness goals.               Discharge Exercise Prescription (Final Exercise Prescription Changes):  Exercise Prescription Changes - 12/15/20 1342       Response to Exercise   Blood Pressure (Admit) 120/60  Blood Pressure (Exercise) 122/72    Blood Pressure (Exit) 120/76    Heart Rate (Admit) 83 bpm    Heart Rate (Exercise) 103 bpm    Heart Rate (Exit) 83 bpm    Rating of Perceived Exertion (Exercise) 11    Symptoms None    Comments Increased duration from 25 to 30 minutes.    Duration Continue with 30 min of aerobic exercise without signs/symptoms of physical distress.    Intensity THRR unchanged      Progression   Progression Continue to progress workloads to maintain intensity without signs/symptoms of physical distress.    Average METs 2      Resistance Training   Training Prescription Yes    Weight 2 lbs    Reps 10-15    Time 10 Minutes      Interval Training   Interval Training No      NuStep   Level 2    SPM 75    Minutes 30    METs 2             Nutrition:  Target Goals: Understanding of nutrition guidelines, daily intake of sodium 1500mg , cholesterol 200mg , calories 30% from fat and 7% or less from saturated fats, daily to have 5 or more servings of fruits and vegetables.  Biometrics:  Pre Biometrics - 11/13/20 1557       Pre Biometrics   Waist Circumference 41 inches    Hip Circumference 37 inches    Waist to Hip Ratio 1.11 %    Triceps Skinfold 16 mm    % Body Fat 28.2 %    Grip Strength 28 kg    Flexibility --   Not performed because of poor mobility and balance   Single Leg Stand --   Not performed because of poor mobility and balance             Nutrition Therapy Plan and Nutrition Goals:  Nutrition Therapy & Goals - 11/27/20 0912       Nutrition Therapy   Diet TLC       Personal Nutrition Goals   Nutrition Goal Pt to build a healthy plate including vegetables, fruits, whole grains, and low-fat dairy products in a heart healthy meal plan.    Personal Goal #2 Pt to continue limiting sodium (avoiding salt shaker, limiting eating out      Intervention Plan   Intervention Prescribe, educate and counsel regarding individualized specific dietary modifications aiming towards targeted core components such as weight, hypertension, lipid management, diabetes, heart failure and other comorbidities.    Expected Outcomes Short Term Goal: Understand basic principles of dietary content, such as calories, fat, sodium, cholesterol and nutrients.;Long Term Goal: Adherence to prescribed nutrition plan.             Nutrition Assessments:  MEDIFICTS Score Key: ?70 Need to make dietary changes  40-70 Heart Healthy Diet ? 40 Therapeutic Level Cholesterol Diet   Flowsheet Row CARDIAC REHAB PHASE II EXERCISE from 11/26/2020 in [place]  Picture Your Plate Total Score on Admission 66      Picture Your Plate Scores: <89 Unhealthy dietary pattern with much room for improvement. 41-50 Dietary pattern unlikely to meet recommendations for good health and room for improvement. 51-60 More healthful dietary pattern, with some room for improvement.  >60 Healthy dietary pattern, although there may be some specific behaviors that could be improved.    Nutrition Goals Re-Evaluation:  Nutrition Goals Re-Evaluation  [place] Name 11/27/20 0913 12/15/20 1402           Goals   Current Weight 170 lb (77.1 kg) 175 lb 0.7 oz (79.4 kg)      Nutrition Goal Pt to build a healthy plate including vegetables, fruits, whole grains, and low-fat dairy products in a heart healthy meal plan. Pt to build a healthy plate including vegetables, fruits, whole grains, and low-fat dairy products in a heart healthy meal plan.             Personal Goal #2 Re-Evaluation       Personal Goal #2 Pt to continue limiting sodium (avoiding salt shaker, limiting eating out Pt to continue limiting sodium (avoiding salt shaker, limiting eating out              Nutrition Goals Re-Evaluation:  Nutrition Goals Re-Evaluation     [place] Name 11/27/20 0913 12/15/20 1402           Goals   Current Weight 170 lb (77.1 kg) 175 lb 0.7 oz (79.4 kg)      Nutrition Goal Pt to build a healthy plate including vegetables, fruits, whole grains, and low-fat dairy products in a heart healthy meal plan. Pt to build a healthy plate including vegetables, fruits, whole grains, and low-fat dairy products in a heart healthy meal plan.             Personal Goal #2 Re-Evaluation      Personal Goal #2 Pt to continue limiting sodium (avoiding salt shaker, limiting eating out Pt to continue limiting sodium (avoiding salt shaker, limiting eating out              Nutrition Goals Discharge (Final Nutrition Goals Re-Evaluation):  Nutrition Goals Re-Evaluation - 12/15/20 1402       Goals   Current Weight 175 lb 0.7 oz (79.4 kg)    Nutrition Goal Pt to build a healthy plate including vegetables, fruits, whole grains, and low-fat dairy products in a heart healthy meal plan.      Personal Goal #2 Re-Evaluation   Personal Goal #2 Pt to continue limiting sodium (avoiding salt shaker, limiting eating out             Psychosocial: Target Goals: Acknowledge presence or absence of significant depression and/or stress, maximize coping skills, provide positive support system. Participant is able to verbalize types and ability to use techniques and skills needed for reducing stress and depression.  Initial Review & Psychosocial Screening:  Initial Psych Review & Screening - 11/13/20 1635       Initial Review   Current issues with History of Depression;Current Stress Concerns    Source of Stress Concerns Chronic Illness;Unable to participate in former interests or  hobbies;Retirement/disability;Unable to perform yard/household activities;Transportation    Comments Truman Hayward admits to being depressed after surgery. Unable to work or drive due to deconditionin. Says depression is better now      [place]? Yes   Truman Hayward has his wife for support   Comments Reviewed quality of life questionnaire with patient given copy to review with Dr Guadlupe Spanish primary care provider      Barriers   Psychosocial barriers to participate in program The patient should benefit from training in stress management and relaxation.;Psychosocial barriers identified (see note)      Screening Interventions   Interventions Encouraged to exercise;To provide support and resources with identified psychosocial needs;Provide feedback about the scores to participant  Expected Outcomes Short Term goal: Utilizing psychosocial counselor, staff and physician to assist with identification of specific Stressors or current issues interfering with healing process. Setting desired goal for each stressor or current issue identified.;Long Term Goal: Stressors or current issues are controlled or eliminated.;Short Term goal: Identification and review with participant of any Quality of Life or Depression concerns found by scoring the questionnaire.             Quality of Life Scores:  Quality of Life - 11/13/20 1630       Quality of Life   Select Quality of Life      Quality of Life Scores   Health/Function Pre 10.21 %    Socioeconomic Pre 21.63 %    Psych/Spiritual Pre 10.07 %    Family Pre 23.7 %    GLOBAL Pre 14.85 %            Scores of 19 and below usually indicate a poorer quality of life in these areas.  A difference of  2-3 points is a clinically meaningful difference.  A difference of 2-3 points in the total score of the Quality of Life Index has been associated with significant improvement in overall quality of life, self-image, physical symptoms, and  general health in studies assessing change in quality of life.  PHQ-9: Recent Review Flowsheet Data     Depression screen [facility] 2/9 11/13/2020 07/30/2020   Decreased Interest 0 0   Down, Depressed, Hopeless 0 1   PHQ - 2 Score 0 1      Interpretation of Total Score  Total Score Depression Severity:  1-4 = Minimal depression, 5-9 = Mild depression, 10-14 = Moderate depression, 15-19 = Moderately severe depression, 20-27 = Severe depression   Psychosocial Evaluation and Intervention:   Psychosocial Re-Evaluation:  Psychosocial Re-Evaluation     [place] Name 12/16/20 1651             Psychosocial Re-Evaluation   Current issues with History of Depression;Current Stress Concerns       Comments Truman Hayward has not mentioned any increased stressors or depression and is enjoying participating in phase 2 cardiac rehab.       Expected Outcomes Truman Hayward will have decreased anxiety, stress upon completion of phase 2 cardiac rehab       Interventions Stress management education;Encouraged to attend Cardiac Rehabilitation for the exercise       Continue Psychosocial Services  Follow up required by staff               Initial Review     Source of Stress Concerns Chronic Illness;Unable to participate in former interests or hobbies;Unable to perform yard/household activities;Transportation       Comments Will continue to montitor and offer support as needed.               Psychosocial Discharge (Final Psychosocial Re-Evaluation):  Psychosocial Re-Evaluation - 12/16/20 1651       Psychosocial Re-Evaluation   Current issues with History of Depression;Current Stress Concerns    Comments Truman Hayward has not mentioned any increased stressors or depression and is enjoying participating in phase 2 cardiac rehab.    Expected Outcomes Truman Hayward will have decreased anxiety, stress upon completion of phase 2 cardiac rehab    Interventions Stress management education;Encouraged to attend Cardiac Rehabilitation for the exercise     Continue Psychosocial Services  Follow up required by staff      Initial Review   Source of Stress Concerns Chronic Illness;Unable  to participate in former interests or hobbies;Unable to perform yard/household activities;Transportation    Comments Will continue to montitor and offer support as needed.             Vocational Rehabilitation: Provide vocational rehab assistance to qualifying candidates.   Vocational Rehab Evaluation & Intervention:  Vocational Rehab - 11/13/20 1639       Initial Vocational Rehab Evaluation & Intervention   Assessment shows need for Vocational Rehabilitation No   Kassim is not currently working and does not need vocational rehab at this time            Education: Education Goals: Education classes will be provided on a weekly basis, covering required topics. Participant will state understanding/return demonstration of topics presented.  Learning Barriers/Preferences:  Learning Barriers/Preferences - 11/13/20 1631       Learning Barriers/Preferences   Learning Barriers Hearing;Sight;Exercise Concerns   Wears glasses/hearing aides/uses cane   Learning Preferences Audio;Computer/Internet;Group Instruction;Individual Instruction;Pictoral;Skilled Demonstration;Verbal Instruction;Video;Written Material             Education Topics: Count Your Pulse:  -Group instruction provided by verbal instruction, demonstration, patient participation and written materials to support subject.  Instructors address importance of being able to find your pulse and how to count your pulse when at home without a heart monitor.  Patients get hands on experience counting their pulse with staff help and individually.   Heart Attack, Angina, and Risk Factor Modification:  -Group instruction provided by verbal instruction, video, and written materials to support subject.  Instructors address signs and symptoms of angina and heart attacks.    Also discuss risk factors  for heart disease and how to make changes to improve heart health risk factors.   Functional Fitness:  -Group instruction provided by verbal instruction, demonstration, patient participation, and written materials to support subject.  Instructors address safety measures for doing things around the house.  Discuss how to get up and down off the floor, how to pick things up properly, how to safely get out of a chair without assistance, and balance training.   Meditation and Mindfulness:  -Group instruction provided by verbal instruction, patient participation, and written materials to support subject.  Instructor addresses importance of mindfulness and meditation practice to help reduce stress and improve awareness.  Instructor also leads participants through a meditation exercise.    Stretching for Flexibility and Mobility:  -Group instruction provided by verbal instruction, patient participation, and written materials to support subject.  Instructors lead participants through series of stretches that are designed to increase flexibility thus improving mobility.  These stretches are additional exercise for major muscle groups that are typically performed during regular warm up and cool down.   Hands Only CPR:  -Group verbal, video, and participation provides a basic overview of AHA guidelines for community CPR. Role-play of emergencies allow participants the opportunity to practice calling for help and chest compression technique with discussion of AED use.   Hypertension: -Group verbal and written instruction that provides a basic overview of hypertension including the most recent diagnostic guidelines, risk factor reduction with self-care instructions and medication management.    Nutrition I class: Heart Healthy Eating:  -Group instruction provided by PowerPoint slides, verbal discussion, and written materials to support subject matter. The instructor gives an explanation and review of the  Therapeutic Lifestyle Changes diet recommendations, which includes a discussion on lipid goals, dietary fat, sodium, fiber, plant stanol/sterol esters, sugar, and the components of a well-balanced, healthy diet.   Nutrition  II class: Lifestyle Skills:  -Group instruction provided by PowerPoint slides, verbal discussion, and written materials to support subject matter. The instructor gives an explanation and review of label reading, grocery shopping for heart health, heart healthy recipe modifications, and ways to make healthier choices when eating out.   Diabetes Question & Answer:  -Group instruction provided by PowerPoint slides, verbal discussion, and written materials to support subject matter. The instructor gives an explanation and review of diabetes co-morbidities, pre- and post-prandial blood glucose goals, pre-exercise blood glucose goals, signs, symptoms, and treatment of hypoglycemia and hyperglycemia, and foot care basics.   Diabetes Blitz:  -Group instruction provided by PowerPoint slides, verbal discussion, and written materials to support subject matter. The instructor gives an explanation and review of the physiology behind type 1 and type 2 diabetes, diabetes medications and rational behind using different medications, pre- and post-prandial blood glucose recommendations and Hemoglobin A1c goals, diabetes diet, and exercise including blood glucose guidelines for exercising safely.    Portion Distortion:  -Group instruction provided by PowerPoint slides, verbal discussion, written materials, and food models to support subject matter. The instructor gives an explanation of serving size versus portion size, changes in portions sizes over the last 20 years, and what consists of a serving from each food group.   Stress Management:  -Group instruction provided by verbal instruction, video, and written materials to support subject matter.  Instructors review role of stress in heart  disease and how to cope with stress positively.     Exercising on Your Own:  -Group instruction provided by verbal instruction, power point, and written materials to support subject.  Instructors discuss benefits of exercise, components of exercise, frequency and intensity of exercise, and end points for exercise.  Also discuss use of nitroglycerin and activating EMS.  Review options of places to exercise outside of rehab.  Review guidelines for sex with heart disease.   Cardiac Drugs I:  -Group instruction provided by verbal instruction and written materials to support subject.  Instructor reviews cardiac drug classes: antiplatelets, anticoagulants, beta blockers, and statins.  Instructor discusses reasons, side effects, and lifestyle considerations for each drug class.   Cardiac Drugs II:  -Group instruction provided by verbal instruction and written materials to support subject.  Instructor reviews cardiac drug classes: angiotensin converting enzyme inhibitors (ACE-I), angiotensin II receptor blockers (ARBs), nitrates, and calcium channel blockers.  Instructor discusses reasons, side effects, and lifestyle considerations for each drug class.   Anatomy and Physiology of the Circulatory System:  Group verbal and written instruction and models provide basic cardiac anatomy and physiology, with the coronary electrical and arterial systems. Review of: AMI, Angina, Valve disease, Heart Failure, Peripheral Artery Disease, Cardiac Arrhythmia, Pacemakers, and the ICD.   Other Education:  -Group or individual verbal, written, or video instructions that support the educational goals of the cardiac rehab program.   Holiday Eating Survival Tips:  -Group instruction provided by PowerPoint slides, verbal discussion, and written materials to support subject matter. The instructor gives patients tips, tricks, and techniques to help them not only survive but enjoy the holidays despite the onslaught of food  that accompanies the holidays.   Knowledge Questionnaire Score:  Knowledge Questionnaire Score - 11/13/20 1634       Knowledge Questionnaire Score   Pre Score 23/24             Core Components/Risk Factors/Patient Goals at Admission:  Personal Goals and Risk Factors at Admission - 11/13/20 1636  Core Components/Risk Factors/Patient Goals on Admission    Weight Management Yes;Weight Maintenance    Intervention Weight Management: Develop a combined nutrition and exercise program designed to reach desired caloric intake, while maintaining appropriate intake of nutrient and fiber, sodium and fats, and appropriate energy expenditure required for the weight goal.;Weight Management: Provide education and appropriate resources to help participant work on and attain dietary goals.    Expected Outcomes Weight Maintenance: Understanding of the daily nutrition guidelines, which includes 25-35% calories from fat, 7% or less cal from saturated fats, less than 200mg  cholesterol, less than 1.5gm of sodium, & 5 or more servings of fruits and vegetables daily;Long Term: Adherence to nutrition and physical activity/exercise program aimed toward attainment of established weight goal;Short Term: Continue to assess and modify interventions until short term weight is achieved;Understanding recommendations for meals to include 15-35% energy as protein, 25-35% energy from fat, 35-60% energy from carbohydrates, less than 200mg  of dietary cholesterol, 20-35 gm of total fiber daily;Understanding of distribution of calorie intake throughout the day with the consumption of 4-5 meals/snacks    Heart Failure Yes    Intervention Provide a combined exercise and nutrition program that is supplemented with education, support and counseling about heart failure. Directed toward relieving symptoms such as shortness of breath, decreased exercise tolerance, and extremity edema.    Expected Outcomes Short term: Attendance in  program 2-3 days a week with increased exercise capacity. Reported lower sodium intake. Reported increased fruit and vegetable intake. Reports medication compliance.;Short term: Daily weights obtained and reported for increase. Utilizing diuretic protocols set by physician.;Long term: Adoption of self-care skills and reduction of barriers for early signs and symptoms recognition and intervention leading to self-care maintenance.    Hypertension Yes    Intervention Provide education on lifestyle modifcations including regular physical activity/exercise, weight management, moderate sodium restriction and increased consumption of fresh fruit, vegetables, and low fat dairy, alcohol moderation, and smoking cessation.;Monitor prescription use compliance.    Expected Outcomes Short Term: Continued assessment and intervention until BP is < 140/66mm HG in hypertensive participants. < 130/43mm HG in hypertensive participants with diabetes, heart failure or chronic kidney disease.;Long Term: Maintenance of blood pressure at goal levels.    Personal Goal Other Yes    Personal Goal Pt wants to increase strength, endurance, and feel a little more normal. He would also like to not be as dependant on his cane.    Intervention Will progress workloads as tolerated without sign or symptom to help pt re-gain strength    Expected Outcomes Pt will achieve his goals             Core Components/Risk Factors/Patient Goals Review:    Core Components/Risk Factors/Patient Goals at Discharge (Final Review):    ITP Comments:  ITP Comments     Row Name 11/13/20 1634 11/17/20 1452 12/16/20 1651       ITP Comments Dr Fransico Him MD, Medical Director 30 Day ITP Review. Truman Hayward did not exercise on 11/17/20 due to low BP to tenatively start on Wednesday 30 Day ITP Review. Truman Hayward has good attendance and particiaption for his fitness level              Comments: See ITP Comments

## 2020-12-17 ENCOUNTER — Encounter (HOSPITAL_COMMUNITY)
Admission: RE | Admit: 2020-12-17 | Discharge: 2020-12-17 | Disposition: A | Discharge status: Home / Self Care | Payer: Medicare HMO | Source: Ambulatory Visit | Attending: Cardiology | Admitting: Cardiology

## 2020-12-17 ENCOUNTER — Other Ambulatory Visit: Payer: Self-pay

## 2020-12-17 DIAGNOSIS — Z951 Presence of aortocoronary bypass graft: Secondary | ICD-10-CM | POA: Diagnosis not present

## 2020-12-17 DIAGNOSIS — Z952 Presence of prosthetic heart valve: Secondary | ICD-10-CM | POA: Diagnosis not present

## 2020-12-19 ENCOUNTER — Encounter (HOSPITAL_COMMUNITY): Payer: Medicare HMO

## 2020-12-22 ENCOUNTER — Ambulatory Visit: Payer: Medicare HMO | Admitting: Cardiology

## 2020-12-22 ENCOUNTER — Encounter (HOSPITAL_COMMUNITY)
Admission: RE | Admit: 2020-12-22 | Discharge: 2020-12-22 | Disposition: A | Discharge status: Home / Self Care | Payer: Medicare HMO | Source: Ambulatory Visit | Attending: Cardiology | Admitting: Cardiology

## 2020-12-22 ENCOUNTER — Other Ambulatory Visit: Payer: Self-pay

## 2020-12-22 DIAGNOSIS — Z952 Presence of prosthetic heart valve: Secondary | ICD-10-CM | POA: Diagnosis not present

## 2020-12-22 DIAGNOSIS — Z951 Presence of aortocoronary bypass graft: Secondary | ICD-10-CM

## 2020-12-24 ENCOUNTER — Encounter (HOSPITAL_COMMUNITY)
Admission: RE | Admit: 2020-12-24 | Discharge: 2020-12-24 | Disposition: A | Discharge status: Home / Self Care | Payer: Medicare HMO | Source: Ambulatory Visit | Attending: Cardiology | Admitting: Cardiology

## 2020-12-24 ENCOUNTER — Encounter (HOSPITAL_COMMUNITY): Payer: Medicare HMO

## 2020-12-24 ENCOUNTER — Other Ambulatory Visit: Payer: Self-pay

## 2020-12-24 DIAGNOSIS — Z952 Presence of prosthetic heart valve: Secondary | ICD-10-CM | POA: Diagnosis not present

## 2020-12-24 DIAGNOSIS — Z951 Presence of aortocoronary bypass graft: Secondary | ICD-10-CM | POA: Diagnosis not present

## 2020-12-26 ENCOUNTER — Other Ambulatory Visit: Payer: Self-pay

## 2020-12-26 ENCOUNTER — Encounter (HOSPITAL_COMMUNITY)
Admission: RE | Admit: 2020-12-26 | Discharge: 2020-12-26 | Disposition: A | Discharge status: Home / Self Care | Payer: Medicare HMO | Source: Ambulatory Visit | Attending: Cardiology | Admitting: Cardiology

## 2020-12-26 DIAGNOSIS — Z951 Presence of aortocoronary bypass graft: Secondary | ICD-10-CM

## 2020-12-26 DIAGNOSIS — Z952 Presence of prosthetic heart valve: Secondary | ICD-10-CM

## 2020-12-30 NOTE — Progress Notes (Signed)
Reviewed home exercise Rx with patient. Pt currently walks in his kitchen 6-7 minutes 2-3 x/day. Encouraged patient to continue to do this and try to do his stretches from the chair. Discussed weather parameters for temperature and humidity in case pt chooses to walk outdoors. We also reviewed symptoms that would require patient to stop activity. Discussed when to call 911 vs MD. Encouraged to keep his activity with an RPE of 11. Pt will continue to walk at home in his kitchen. Pt verbalized understanding of the home exercise Rx and was provided a copy.   Lesly Rubenstein MS, ACSM-CEP, CCRP

## 2020-12-31 ENCOUNTER — Other Ambulatory Visit: Payer: Self-pay

## 2020-12-31 ENCOUNTER — Encounter (HOSPITAL_COMMUNITY)
Admission: RE | Admit: 2020-12-31 | Discharge: 2020-12-31 | Disposition: A | Discharge status: Home / Self Care | Payer: Medicare HMO | Source: Ambulatory Visit | Attending: Cardiology | Admitting: Cardiology

## 2020-12-31 DIAGNOSIS — Z952 Presence of prosthetic heart valve: Secondary | ICD-10-CM

## 2020-12-31 DIAGNOSIS — Z951 Presence of aortocoronary bypass graft: Secondary | ICD-10-CM | POA: Diagnosis not present

## 2021-01-01 DIAGNOSIS — M25512 Pain in left shoulder: Secondary | ICD-10-CM | POA: Diagnosis not present

## 2021-01-02 ENCOUNTER — Encounter (HOSPITAL_COMMUNITY)
Admission: RE | Admit: 2021-01-02 | Discharge: 2021-01-02 | Disposition: A | Discharge status: Home / Self Care | Payer: Medicare HMO | Source: Ambulatory Visit | Attending: Cardiology | Admitting: Cardiology

## 2021-01-02 ENCOUNTER — Other Ambulatory Visit: Payer: Self-pay

## 2021-01-02 DIAGNOSIS — Z952 Presence of prosthetic heart valve: Secondary | ICD-10-CM | POA: Diagnosis not present

## 2021-01-02 DIAGNOSIS — Z951 Presence of aortocoronary bypass graft: Secondary | ICD-10-CM | POA: Diagnosis not present

## 2021-01-05 ENCOUNTER — Other Ambulatory Visit: Payer: Self-pay

## 2021-01-05 ENCOUNTER — Encounter (HOSPITAL_COMMUNITY)
Admission: RE | Admit: 2021-01-05 | Discharge: 2021-01-05 | Disposition: A | Discharge status: Home / Self Care | Payer: Medicare HMO | Source: Ambulatory Visit | Attending: Cardiology | Admitting: Cardiology

## 2021-01-05 DIAGNOSIS — Z952 Presence of prosthetic heart valve: Secondary | ICD-10-CM | POA: Diagnosis not present

## 2021-01-05 DIAGNOSIS — Z951 Presence of aortocoronary bypass graft: Secondary | ICD-10-CM | POA: Diagnosis not present

## 2021-01-07 ENCOUNTER — Encounter (HOSPITAL_COMMUNITY)
Admission: RE | Admit: 2021-01-07 | Discharge: 2021-01-07 | Disposition: A | Discharge status: Home / Self Care | Payer: Medicare HMO | Source: Ambulatory Visit | Attending: Cardiology | Admitting: Cardiology

## 2021-01-07 ENCOUNTER — Other Ambulatory Visit: Payer: Self-pay

## 2021-01-07 VITALS — Ht 68.5 in | Wt 171.7 lb

## 2021-01-07 DIAGNOSIS — Z952 Presence of prosthetic heart valve: Secondary | ICD-10-CM | POA: Diagnosis not present

## 2021-01-07 DIAGNOSIS — Z951 Presence of aortocoronary bypass graft: Secondary | ICD-10-CM | POA: Diagnosis not present

## 2021-01-09 ENCOUNTER — Other Ambulatory Visit: Payer: Self-pay

## 2021-01-09 ENCOUNTER — Encounter (HOSPITAL_COMMUNITY)
Admission: RE | Admit: 2021-01-09 | Discharge: 2021-01-09 | Disposition: A | Discharge status: Home / Self Care | Payer: Medicare HMO | Source: Ambulatory Visit | Attending: Cardiology | Admitting: Cardiology

## 2021-01-09 DIAGNOSIS — Z952 Presence of prosthetic heart valve: Secondary | ICD-10-CM

## 2021-01-09 DIAGNOSIS — Z951 Presence of aortocoronary bypass graft: Secondary | ICD-10-CM

## 2021-01-09 NOTE — Progress Notes (Signed)
Pt graduated from cardiac rehab program today with completion of 19 exercise sessions in Phase II. Pt maintained good attendance and progressed  during his participation in rehab as evidenced by increased MET level.   Medication list reconciled with his wife. Repeat  PHQ score- 0 .  Pt has made lifestyle changes and should be commended for his success. Pt feels he has made progress toward achieving his long term goals during cardiac rehab. [name] [name] would benefit from continued rehab with the concentration on balance training core strengthening.  [name] [name] admits that he is unsteady on his feet.  [name] [name] has a [facility] program that works with pt for improved balance and decrease falls risk.  Pt and his wife were giving a brochure which details what their program entails.  Also provided a Provider order referral form they can give to his PCP if they feel he would benefit.  Cherre Huger, BSN Cardiac and Training and development officer

## 2021-01-12 ENCOUNTER — Other Ambulatory Visit: Payer: Self-pay | Admitting: *Deleted

## 2021-01-12 ENCOUNTER — Ambulatory Visit (HOSPITAL_COMMUNITY): Payer: Medicare HMO

## 2021-01-12 NOTE — Patient Outreach (Signed)
[facility]) Care Management  01/12/2021  [name] [name] [birth year] [id number]   Outgoing call placed to member/wife, no answer, HIPAA compliant voice message left.  Will follow up within the next 3-4 business days.  [name], [place], MSN [place] [phone]

## 2021-01-15 ENCOUNTER — Other Ambulatory Visit: Payer: Self-pay | Admitting: *Deleted

## 2021-01-15 NOTE — Patient Outreach (Signed)
[name]) Care Management  01/15/2021  [name] [name] 1928-05-27 [id number]   Outreach attempt #2, unsuccessful, HIPAA compliant voice message left.  Will send outreach letter and follow up within the next 3-4 business days.  [name], [place], MSN [place] [phone]

## 2021-01-20 ENCOUNTER — Other Ambulatory Visit: Payer: Self-pay | Admitting: *Deleted

## 2021-01-20 DIAGNOSIS — R339 Retention of urine, unspecified: Secondary | ICD-10-CM | POA: Diagnosis not present

## 2021-01-20 NOTE — Patient Outreach (Signed)
[place] [facility]) Care Management  01/20/2021  [name] [name] Jun 16, 1928 [id number]   Outreach attempt #3, unsuccessful, HIPAA compliant voice message left.  Will make 4th and final attempt within the next 4 weeks, if remain unsuccessful will close case due to inability to maintain contact.  [name], [place], MSN [place] [phone]

## 2021-01-22 NOTE — Progress Notes (Signed)
Discharge Progress Report  Patient Details  Name: [name] [name] MRN: [id number] Date of Birth: 11-Jan-1928 Referring Provider:   Flowsheet Row CARDIAC REHAB PHASE II ORIENTATION from 11/13/2020 in [place]  Referring Provider Dr. Peter Martinique, MD        Number of Visits: 22  Reason for Discharge:  Patient reached a stable level of exercise. Patient independent in their exercise. Patient has met program and personal goals.  Smoking History:  Social History   Tobacco Use  Smoking Status Never  Smokeless Tobacco Never    Diagnosis:  05/23/20 S/P CABG x 4  05/23/20 AVR  ADL UCSD:   Initial Exercise Prescription:  Initial Exercise Prescription - 11/13/20 1600       Date of Initial Exercise RX and Referring Provider   Date 11/13/20    Referring Provider Dr. Peter Martinique, MD    Expected Discharge Date 01/09/21      NuStep   Level 1    SPM 65    Minutes 20    METs 1.2      Prescription Details   Frequency (times per week) 3    Duration Progress to 30 minutes of continuous aerobic without signs/symptoms of physical distress      Intensity   THRR 40-80% of Max Heartrate 52-104    Ratings of Perceived Exertion 11-13    Perceived Dyspnea 0-4      Progression   Progression Continue progressive overload as per policy without signs/symptoms or physical distress.      Resistance Training   Training Prescription Yes    Weight 4    Reps 10-15             Discharge Exercise Prescription (Final Exercise Prescription Changes):  Exercise Prescription Changes - 01/09/21 1523       Response to Exercise   Blood Pressure (Admit) 102/60    Blood Pressure (Exercise) 120/69    Blood Pressure (Exit) 95/50    Heart Rate (Admit) 85 bpm    Heart Rate (Exercise) 101 bpm    Heart Rate (Exit) 85 bpm    Rating of Perceived Exertion (Exercise) 9    Symptoms None    Comments Pt graduated from the CRP2 program    Duration Continue with 30  min of aerobic exercise without signs/symptoms of physical distress.    Intensity THRR unchanged      Progression   Progression Continue to progress workloads to maintain intensity without signs/symptoms of physical distress.    Average METs 2.2      Resistance Training   Training Prescription Yes    Weight 2 lbs    Reps 10-15    Time 10 Minutes      Interval Training   Interval Training No      NuStep   Level 2    SPM 85    Minutes 30    METs 2.2      Home Exercise Plan   Plans to continue exercise at Home (comment)    Frequency Add 2 additional days to program exercise sessions.    Initial Home Exercises Provided 12/26/20             Functional Capacity:  6 Minute Walk     Row Name 11/13/20 1619 01/07/21 1345       6 Minute Walk   Phase Initial Discharge    Distance 1214 feet  NuStep Stepper test 1673 feet  Nustep used    Distance %  Change -- 37.8 %    Distance Feet Change -- 459 ft    Walk Time 6 minutes 6 minutes    # of Rest Breaks 0 0    MPH 2.3 3.17    METS 1.9 2.4    RPE 10 9    Perceived Dyspnea  1 0    VO2 Peak 6.66 8.48    Symptoms No No    Resting HR 72 bpm 98 bpm    Resting BP 110/69 100/58    Resting Oxygen Saturation  98 % 95 %    Exercise Oxygen Saturation  during 6 min walk 95 % 96 %    Max Ex. HR 113 bpm 102 bpm    Max Ex. BP 128/71 106/60    2 Minute Post BP 102/66 100/60             Psychological, QOL, Others - Outcomes: PHQ 2/9: Depression screen [facility] 2/9 01/09/2021 11/13/2020 07/30/2020  Decreased Interest 0 0 0  Down, Depressed, Hopeless 0 0 1  PHQ - 2 Score 0 0 1  Altered sleeping 0 - -  Tired, decreased energy 0 - -  Change in appetite 0 - -  Feeling bad or failure about yourself  0 - -  Trouble concentrating 0 - -  Moving slowly or fidgety/restless 0 - -  Suicidal thoughts 0 - -  PHQ-9 Score 0 - -  Difficult doing work/chores Not difficult at all - -    Quality of Life:  Quality of Life - 01/07/21 1500        Quality of Life Scores   Health/Function Post 10.43 %    Socioeconomic Post 20.17 %    Psych/Spiritual Post 21.58 %    Family Post 26.2 %    GLOBAL Post 17.02 %             Personal Goals: Goals established at orientation with interventions provided to work toward goal.  Personal Goals and Risk Factors at Admission - 11/13/20 1636       Core Components/Risk Factors/Patient Goals on Admission    Weight Management Yes;Weight Maintenance    Intervention Weight Management: Develop a combined nutrition and exercise program designed to reach desired caloric intake, while maintaining appropriate intake of nutrient and fiber, sodium and fats, and appropriate energy expenditure required for the weight goal.;Weight Management: Provide education and appropriate resources to help participant work on and attain dietary goals.    Expected Outcomes Weight Maintenance: Understanding of the daily nutrition guidelines, which includes 25-35% calories from fat, 7% or less cal from saturated fats, less than 252m cholesterol, less than 1.5gm of sodium, & 5 or more servings of fruits and vegetables daily;Long Term: Adherence to nutrition and physical activity/exercise program aimed toward attainment of established weight goal;Short Term: Continue to assess and modify interventions until short term weight is achieved;Understanding recommendations for meals to include 15-35% energy as protein, 25-35% energy from fat, 35-60% energy from carbohydrates, less than 2079mof dietary cholesterol, 20-35 gm of total fiber daily;Understanding of distribution of calorie intake throughout the day with the consumption of 4-5 meals/snacks    Heart Failure Yes    Intervention Provide a combined exercise and nutrition program that is supplemented with education, support and counseling about heart failure. Directed toward relieving symptoms such as shortness of breath, decreased exercise tolerance, and extremity edema.    Expected  Outcomes Short term: Attendance in program 2-3 days a week with increased exercise capacity. Reported lower  sodium intake. Reported increased fruit and vegetable intake. Reports medication compliance.;Short term: Daily weights obtained and reported for increase. Utilizing diuretic protocols set by physician.;Long term: Adoption of self-care skills and reduction of barriers for early signs and symptoms recognition and intervention leading to self-care maintenance.    Hypertension Yes    Intervention Provide education on lifestyle modifcations including regular physical activity/exercise, weight management, moderate sodium restriction and increased consumption of fresh fruit, vegetables, and low fat dairy, alcohol moderation, and smoking cessation.;Monitor prescription use compliance.    Expected Outcomes Short Term: Continued assessment and intervention until BP is < 140/28m HG in hypertensive participants. < 130/823mHG in hypertensive participants with diabetes, heart failure or chronic kidney disease.;Long Term: Maintenance of blood pressure at goal levels.    Personal Goal Other Yes    Personal Goal Pt wants to increase strength, endurance, and feel a little more normal. He would also like to not be as dependant on his cane.    Intervention Will progress workloads as tolerated without sign or symptom to help pt re-gain strength    Expected Outcomes Pt will achieve his goals              Personal Goals Discharge:  Goals and Risk Factor Review     Row Name 01/09/21 1458             Core Components/Risk Factors/Patient Goals Review   Personal Goals Review Weight Management/Obesity;Heart Failure       Expected Outcomes LeSamanyuraduates with the completion of 19 exercise sessions.  Information given to pt and his wife regarding the BOOST program for continue rehab and balance training.  Pt did show modest weight gain however he is not volume overloaded.  Attributes the increase weight with his  appetite improving. Pt  wife who completed the medication reconcillation reports he is compliant with his medication and adhering to recommendations by the heart failure team.                Exercise Goals and Review:  Exercise Goals     Row Name 11/13/20 1622             Exercise Goals   Increase Physical Activity Yes       Intervention Provide advice, education, support and counseling about physical activity/exercise needs.;Develop an individualized exercise prescription for aerobic and resistive training based on initial evaluation findings, risk stratification, comorbidities and participant's personal goals.       Expected Outcomes Short Term: Attend rehab on a regular basis to increase amount of physical activity.;Long Term: Add in home exercise to make exercise part of routine and to increase amount of physical activity.;Long Term: Exercising regularly at least 3-5 days a week.       Increase Strength and Stamina Yes       Intervention Provide advice, education, support and counseling about physical activity/exercise needs.;Develop an individualized exercise prescription for aerobic and resistive training based on initial evaluation findings, risk stratification, comorbidities and participant's personal goals.       Expected Outcomes Short Term: Increase workloads from initial exercise prescription for resistance, speed, and METs.;Short Term: Perform resistance training exercises routinely during rehab and add in resistance training at home;Long Term: Improve cardiorespiratory fitness, muscular endurance and strength as measured by increased METs and functional capacity (6MWT)       Able to understand and use rate of perceived exertion (RPE) scale Yes       Intervention Provide education and explanation on  how to use RPE scale       Expected Outcomes Short Term: Able to use RPE daily in rehab to express subjective intensity level;Long Term:  Able to use RPE to guide intensity level  when exercising independently       Knowledge and understanding of Target Heart Rate Range (THRR) Yes       Intervention Provide education and explanation of THRR including how the numbers were predicted and where they are located for reference       Expected Outcomes Short Term: Able to state/look up THRR;Long Term: Able to use THRR to govern intensity when exercising independently;Short Term: Able to use daily as guideline for intensity in rehab       Understanding of Exercise Prescription Yes       Intervention Provide education, explanation, and written materials on patient's individual exercise prescription       Expected Outcomes Short Term: Able to explain program exercise prescription;Long Term: Able to explain home exercise prescription to exercise independently                Exercise Goals Re-Evaluation:  Exercise Goals Re-Evaluation     Row Name 11/19/20 1429 12/17/20 1409 01/09/21 1526         Exercise Goal Re-Evaluation   Exercise Goals Review Increase Physical Activity;Increase Strength and Stamina;Able to understand and use rate of perceived exertion (RPE) scale;Knowledge and understanding of Target Heart Rate Range (THRR);Understanding of Exercise Prescription Increase Physical Activity;Increase Strength and Stamina;Able to understand and use rate of perceived exertion (RPE) scale;Knowledge and understanding of Target Heart Rate Range (THRR);Understanding of Exercise Prescription Increase Physical Activity;Increase Strength and Stamina;Able to understand and use rate of perceived exertion (RPE) scale;Knowledge and understanding of Target Heart Rate Range (THRR);Able to check pulse independently;Understanding of Exercise Prescription     Comments Pt's first day in the CRP2 program. Pt understnads the THRR, exercise RX, and RPE scale. Patient is walking 20 times around his kitchen, which takes him 6-7 minutes, 2-3 times/day. Patient is also doing some resistance exercises at  home. Patient's goal is to increase strength and stamina in his arms and legs and to have "better control" of his arms and legs. Patient wants to be able to get out of a chair independently. Pt graduated from the [place] program today. Pt was able to progress to an average MET level of 2.2. Pt will continue to exercise at home by walking in the house. We also provided information on the BOOST program. The patient has poor balance and molbility so he may benifit form that program.     Expected Outcomes Will continue to monitor patient and progress as tolerated. Progress workloads as tolerated to help achieve personal health and fitness goals. Pt will continue to exercise on hsi own at home by walking in the house.              Nutrition & Weight - Outcomes:  Pre Biometrics - 11/13/20 1557       Pre Biometrics   Waist Circumference 41 inches    Hip Circumference 37 inches    Waist to Hip Ratio 1.11 %    Triceps Skinfold 16 mm    % Body Fat 28.2 %    Grip Strength 28 kg    Flexibility --   Not performed because of poor mobility and balance   Single Leg Stand --   Not performed because of poor mobility and balance  Post Biometrics - 01/07/21 1430        Post  Biometrics   Height 5' 8.5" (1.74 m)    Weight 77.9 kg    Waist Circumference 40 inches    Hip Circumference 40 inches    Waist to Hip Ratio 1 %    BMI (Calculated) 25.73    Triceps Skinfold 16 mm    % Body Fat 27.8 %    Grip Strength 30 kg    Flexibility --   Not done mobility issues   Single Leg Stand --   Not done balance isses            Nutrition:  Nutrition Therapy & Goals - 11/27/20 0912       Nutrition Therapy   Diet TLC      Personal Nutrition Goals   Nutrition Goal Pt to build a healthy plate including vegetables, fruits, whole grains, and low-fat dairy products in a heart healthy meal plan.    Personal Goal #2 Pt to continue limiting sodium (avoiding salt shaker, limiting eating out       Intervention Plan   Intervention Prescribe, educate and counsel regarding individualized specific dietary modifications aiming towards targeted core components such as weight, hypertension, lipid management, diabetes, heart failure and other comorbidities.    Expected Outcomes Short Term Goal: Understand basic principles of dietary content, such as calories, fat, sodium, cholesterol and nutrients.;Long Term Goal: Adherence to prescribed nutrition plan.             Nutrition Discharge:   Education Questionnaire Score:  Knowledge Questionnaire Score - 01/07/21 1500       Knowledge Questionnaire Score   Post Score 22/24             Goals reviewed with patient; copy given to patient.Pt graduated from cardiac rehab program on 01/09/21 with completion of 19 exercise sessions in Phase II. Pt maintained good attendance and progressed  fairly during his participation in rehab for his fitness level as evidenced by increased MET level.  Cindy increased his distance on his post exercise walk test by 459 feet. Medication list reconciled. Repeat  PHQ score- 0 .  Pt has made significant lifestyle changes and should be commended for his success. Pt feels he has achieved his goals during cardiac rehab. Geran remains very deconditioned and was given a brochure/ referral for the boost program. Bladen plans to exercise by walking at home.[name], RN,BSN 01/27/2021 3:33 PM

## 2021-01-26 ENCOUNTER — Other Ambulatory Visit: Payer: Self-pay | Admitting: *Deleted

## 2021-01-26 NOTE — Patient Outreach (Signed)
[facility]) Care Management  [place]  01/26/2021   [name] [name] 1927-11-11 [id number]   Incoming call received from [facility] wife, state he is still progressing well.  Losartan has decreased to half a pill daily as blood pressures has remained low but stable, ranging 25-427'C systolic.  Still working on a sleep routine that works for him, does not want to try prescribed sleep aides.  Trying Melatonin and benadryl, will discuss with provider if this does not work.  Denies any urgent concerns, encouraged to contact this care manager with questions.   Encounter Medications:  Outpatient Encounter Medications as of 01/26/2021  Medication Sig   apixaban (ELIQUIS) 5 MG TABS tablet Take 1 tablet (5 mg total) by mouth 2 (two) times daily.   Ascorbic Acid (VITAMIN C) 100 MG tablet Take 100 mg by mouth daily.   aspirin 81 MG tablet Take 81 mg by mouth at bedtime.    Cholecalciferol (VITAMIN D) 50 MCG (2000 UT) tablet Take 1 tablet by mouth daily.   docusate sodium (COLACE) 100 MG capsule Take 100 mg by mouth daily as needed for mild constipation.   dutasteride (AVODART) 0.5 MG capsule Take 0.5 mg by mouth daily.   feeding supplement, ENSURE COMPLETE, (ENSURE COMPLETE) LIQD Take 237 mLs by mouth 2 (two) times daily between meals.   ferrous [id number] C-folic acid (TRINSICON / FOLTRIN) capsule Take 1 capsule by mouth 2 (two) times daily after a meal.   furosemide (LASIX) 40 MG tablet Take 40 mg by mouth every other day.   losartan (COZAAR) 25 MG tablet Take 1 tablet (25 mg total) by mouth at bedtime. (Patient taking differently: Take 12.5 mg by mouth at bedtime. Dose decreased to a 1/2 of a 25 mg tablet By Dr Addison Lank on 11/14/20 due to low BP)   melatonin 5 MG TABS Take 1 tablet (5 mg total) by mouth at bedtime as needed (sleep).   Multiple Vitamins-Minerals (CENTRUM SILVER PO) Take 1 tablet by mouth daily.    sertraline (ZOLOFT) 50 MG tablet Take 1 tablet by mouth  daily.   sulfaSALAzine (AZULFIDINE) 500 MG tablet Take 2,000 mg by mouth daily.   tamsulosin (FLOMAX) 0.4 MG CAPS Take 0.4 mg by mouth 2 (two) times daily.    vitamin B-12 (CYANOCOBALAMIN) 100 MCG tablet Take 1 tablet by mouth daily.   No facility-administered encounter medications on file as of 01/26/2021.    Functional Status:  In your present state of health, do you have any difficulty performing the following activities: 05/16/2020 05/16/2020  Hearing? - Y  Vision? - N  Difficulty concentrating or making decisions? - N  Walking or climbing stairs? - Y  Dressing or bathing? - N  Doing errands, shopping? N -  Some recent data might be hidden    Fall/Depression Screening: Fall Risk  11/13/2020  Falls in the past year? 1  Number falls in past yr: 0  Injury with Fall? 0  Risk for fall due to : History of fall(s);Impaired balance/gait;Impaired mobility;Impaired vision;Medication side effect;Other (Comment)  Risk for fall due to: Comment Pt uses cane  Follow up Falls evaluation completed   PHQ 2/9 Scores 01/09/2021 11/13/2020 07/30/2020  PHQ - 2 Score 0 0 1  PHQ- 9 Score 0 - -  Exception Documentation - - (No Data)    Assessment:   Care Plan There are no care plans that you recently modified to display for this patient.    Goals Addressed  This Visit's Progress    THN - Improve My Heart Health-Coronary Artery Disease   On track    Timeframe:  Long-Range Goal Priority:  High Start Date:        4/7  (date reset)            Expected End Date:      10/7    Barriers: Knowledge    - be open to making changes - learn about small changes that will make a big difference    Why is this important?   Lifestyle changes are key to improving the blood flow to your heart. Think about the things you can change and set a goal to live healthy.  Remember, when the blood vessels to your heart start to get clogged you may not have any symptoms.  Over time, they can get  worse.  Don't ignore the signs, like chest pain, and get help right away.     Notes:   Encouraged to contact MD with any potential complications  3/4 - Discussed medication changes with wife, encouraged to monitor BP, HR, and weights daily  4/7 - Discussed plan to have member participate in cardiac rehab  5/13 - Reviewed blood pressure trends, remain low (90-110s/60-70's).  Confirms he is building endurance, will start rehab next week  6/21 - Remains active with cardiac rehab, next session tomorrow.    8/1 - Per wife, member completed cardiac rehab, but they are recommending a balance program.  She will discuss with provider and possibly have outpatient PT ordered        Plan:  Follow-up: Patient agrees to Care Plan and Follow-up. Follow-up in 1 month(s).  [name], [place], MSN [place] [phone]

## 2021-02-10 ENCOUNTER — Other Ambulatory Visit: Payer: Self-pay | Admitting: Cardiology

## 2021-02-11 NOTE — Telephone Encounter (Signed)
Prescription refill request for Eliquis received. Last office visit:jordan 12/16/20 Scr:0.99 11/05/20 Age: 39m Weight:82.6

## 2021-02-18 ENCOUNTER — Ambulatory Visit: Payer: Self-pay | Admitting: *Deleted

## 2021-02-25 ENCOUNTER — Other Ambulatory Visit: Payer: Self-pay

## 2021-02-25 ENCOUNTER — Ambulatory Visit (INDEPENDENT_AMBULATORY_CARE_PROVIDER_SITE_OTHER): Payer: Medicare HMO | Admitting: Otolaryngology

## 2021-02-25 DIAGNOSIS — R339 Retention of urine, unspecified: Secondary | ICD-10-CM | POA: Diagnosis not present

## 2021-02-25 DIAGNOSIS — J300 Vasomotor rhinitis: Secondary | ICD-10-CM | POA: Diagnosis not present

## 2021-02-25 NOTE — Progress Notes (Signed)
HPI: [name] [name] is a 87 y.o. male who presents is referred by his PCP for evaluation of nasal drainage.  He has history of allergies and takes allergy pills. Apparently underwent major heart surgery in November of last year and since is discharged home in February he has noticed that whenever he eats he tends to get a runny nose. He does not describe trouble breathing through his nose. Does not really describe nasal drainage at night.  Mostly occurs just when he eats.  Past Medical History:  Diagnosis Date   Aortic stenosis    Arthritis    CAD (coronary artery disease)    CHF (congestive heart failure) (HCC)    Colitis    Edema    Essential hypertension    no med now   Essential tremor    Hyperlipidemia    Hypotonic bladder    L4-L5   Obstructive uropathy    Peripheral neuropathy    Elevated by Dr. love 2010   Prostate cancer [facility]) 2005   Past Surgical History:  Procedure Laterality Date   ACHILLES TENDON SURGERY Bilateral 1995   AORTIC VALVE REPLACEMENT N/A 05/23/2020   Procedure: AORTIC VALVE REPLACEMENT (AVR) USING EDWARDS RESILIA 29 MM AORTIC VALVE.;  Surgeon: Gaye Pollack, MD;  Location: [facility];  Service: Open Heart Surgery;  Laterality: N/A;   CARDIAC CATHETERIZATION     COLONOSCOPY     With polyp resection   CORONARY ARTERY BYPASS GRAFT N/A 05/23/2020   Procedure: CORONARY ARTERY BYPASS GRAFTING (CABG) USING LIMA to LAD; ENDOSCOPIC HARVESTED RIGHT GREATER SAPHENOUS VEIN: SVG to PD; SVG to SEQUENCED INTERMEDIATE to OM;  Surgeon: Gaye Pollack, MD;  Location: [place];  Service: Open Heart Surgery;  Laterality: N/A;   ENDOVEIN HARVEST OF GREATER SAPHENOUS VEIN Right 05/23/2020   Procedure: ENDOVEIN HARVEST OF GREATER SAPHENOUS VEIN;  Surgeon: Gaye Pollack, MD;  Location: [place];  Service: Open Heart Surgery;  Laterality: Right;   REVERSE SHOULDER ARTHROPLASTY Right 06/04/2016   REVERSE SHOULDER ARTHROPLASTY Right 06/04/2016   Procedure: REVERSE SHOULDER ARTHROPLASTY;   Surgeon: Netta Cedars, MD;  Location: [place];  Service: Orthopedics;  Laterality: Right;   RIGHT/LEFT HEART CATH AND CORONARY ANGIOGRAPHY N/A 05/20/2020   Procedure: RIGHT/LEFT HEART CATH AND CORONARY ANGIOGRAPHY;  Surgeon: Martinique, Peter M, MD;  Location: [place] CV LAB;  Service: Cardiovascular;  Laterality: N/A;   SKIN CANCER EXCISION  1974   On chest wall   TEE WITHOUT CARDIOVERSION N/A 05/23/2020   Procedure: TRANSESOPHAGEAL ECHOCARDIOGRAM (TEE);  Surgeon: Gaye Pollack, MD;  Location: [place];  Service: Open Heart Surgery;  Laterality: N/A;   TOTAL KNEE ARTHROPLASTY Right 2011   TOTAL KNEE ARTHROPLASTY Left 04/09/2013   Procedure: LEFT TOTAL KNEE ARTHROPLASTY;  Surgeon: Gearlean Alf, MD;  Location: [facility];  Service: Orthopedics;  Laterality: Left;   Social History   Socioeconomic History   Marital status: Married    Spouse name: Not on file   Number of children: Not on file   Years of education: 18   Highest education level: Master's degree (e.g., MA, MS, MEng, MEd, MSW, MBA)  Occupational History   Not on file  Tobacco Use   Smoking status: Never   Smokeless tobacco: Never  Vaping Use   Vaping Use: Never used  Substance and Sexual Activity   Alcohol use: Yes    Alcohol/week: 1.0 standard drink    Types: 1 Glasses of wine per week    Comment: occ  Drug use: No   Sexual activity: Yes  Other Topics Concern   Not on file  Social History Narrative   Not on file   Social Determinants of Health   Financial Resource Strain: Not on file  Food Insecurity: No Food Insecurity   Worried About Running Out of Food in the Last Year: Never true   Ran Out of Food in the Last Year: Never true  Transportation Needs: No Transportation Needs   Lack of Transportation (Medical): No   Lack of Transportation (Non-Medical): No  Physical Activity: Not on file  Stress: Not on file  Social Connections: Not on file   Family History  Problem Relation Age of Onset   Hypertension  Mother    Heart failure Mother    Diabetes Mother    Heart attack Brother    Heart attack Maternal Aunt    Allergies  Allergen Reactions   Simvastatin Other (See Comments)    "caused neuropathy pain"   Prior to Admission medications   Medication Sig Start Date End Date Taking? Authorizing Provider  apixaban (ELIQUIS) 5 MG TABS tablet Take 1 tablet by mouth twice daily 02/11/21   Martinique, Peter M, MD  Ascorbic Acid (VITAMIN C) 100 MG tablet Take 100 mg by mouth daily.    [provider]  aspirin 81 MG tablet Take 81 mg by mouth at bedtime.     [provider]  Cholecalciferol (VITAMIN D) 50 MCG (2000 UT) tablet Take 1 tablet by mouth daily.    [provider]  docusate sodium (COLACE) 100 MG capsule Take 100 mg by mouth daily as needed for mild constipation.    [provider]  dutasteride (AVODART) 0.5 MG capsule Take 0.5 mg by mouth daily.    [provider]  feeding supplement, ENSURE COMPLETE, (ENSURE COMPLETE) LIQD Take 237 mLs by mouth 2 (two) times daily between meals.    [provider]  ferrous [id number] C-folic acid (TRINSICON / FOLTRIN) capsule Take 1 capsule by mouth 2 (two) times daily after a meal. 06/26/20   Gold, Wayne E, PA-C  furosemide (LASIX) 40 MG tablet Take 40 mg by mouth every other day.    [provider]  losartan (COZAAR) 25 MG tablet Take 1 tablet (25 mg total) by mouth at bedtime. Patient taking differently: Take 12.5 mg by mouth at bedtime. Dose decreased to a 1/2 of a 25 mg tablet By Dr Addison Lank on 11/14/20 due to low BP 11/05/20 11/05/21  Bensimhon, Shaune Pascal, MD  melatonin 5 MG TABS Take 1 tablet (5 mg total) by mouth at bedtime as needed (sleep). 06/26/20   Gold, Wilder Glade, PA-C  Multiple Vitamins-Minerals (CENTRUM SILVER PO) Take 1 tablet by mouth daily.     [provider]  sertraline (ZOLOFT) 50 MG tablet Take 1 tablet by mouth daily. 09/09/20   [provider]   sulfaSALAzine (AZULFIDINE) 500 MG tablet Take 2,000 mg by mouth daily. 08/15/20   [provider]  tamsulosin (FLOMAX) 0.4 MG CAPS Take 0.4 mg by mouth 2 (two) times daily.     [provider]  vitamin B-12 (CYANOCOBALAMIN) 100 MCG tablet Take 1 tablet by mouth daily.    [provider]     Positive ROS: Otherwise negative  All other systems have been reviewed and were otherwise negative with the exception of those mentioned in the HPI and as above.  Physical Exam: Constitutional: Alert, well-appearing, no acute distress Ears: External ears without lesions or  tenderness. Ear canals are clear bilaterally with intact, clear TMs.  Nasal: External nose without lesions. Septum with mild deformity and mild rhinitis.  After decongesting the nose both middle meatus regions were clear with no mucopurulent discharge noted no polyps noted. Clear nasal passages otherwise. Oral: Lips and gums without lesions. Tongue and palate mucosa without lesions. Posterior oropharynx clear. Neck: No palpable adenopathy or masses Respiratory: Breathing comfortably  Skin: No facial/neck lesions or rash noted.  Procedures  Assessment: Vasomotor rhinitis  Plan: Reviewed with patient concerning vasomotor rhinitis. Suggested trying to use a saline nasal spray when he develops the runny nose.  He could also use Atrovent 0.06% nasal spray twice daily as this will help reduce nasal drainage.  Sent a prescription into his pharmacy at [facility], MD   CC:

## 2021-02-26 ENCOUNTER — Telehealth: Payer: Self-pay | Admitting: Cardiology

## 2021-02-26 NOTE — Telephone Encounter (Signed)
      [facility] Pre-operative Risk Assessment    Patient Name: [name] [name]  DOB: Jan 16, 1928 MRN: [id number]  HEARTCARE STAFF:  - IMPORTANT!!!!!! Under Visit Info/Reason for Call, type in Other and utilize the format Clearance MM/DD/YY or Clearance TBD. Do not use dashes or single digits. - Please review there is not already an duplicate clearance open for this procedure. - If request is for dental extraction, please clarify the # of teeth to be extracted. - If the patient is currently at the dentist's office, call Pre-Op Callback Staff (MA/nurse) to input urgent request.  - If the patient is not currently in the dentist office, please route to the Pre-Op pool.  Request for surgical clearance:  What type of surgery is being performed? Dental cleaning  When is this surgery scheduled? 02/26/21  What type of clearance is required (medical clearance vs. Pharmacy clearance to hold med vs. Both)? Medical clearance  Are there any medications that need to be held prior to surgery and how long? If needs pre med  Practice name and name of physician performing surgery? Dr. Tamala Bari  What is the office phone number? [phone]   7.   What is the office fax number? [phone]  8.   Anesthesia type (None, local, MAC, general) ? None   [name] [name] 02/26/2021, 2:14 PM  _________________________________________________________________   (provider comments below)

## 2021-02-26 NOTE — Telephone Encounter (Signed)
   Patient Name: [name] [name]  DOB: May 30, 1928 MRN: [id number]  Primary Cardiologist: Peter Martinique, MD  Chart reviewed as part of pre-operative protocol coverage. Simple dental extractions/cleanings are considered low risk procedures per guidelines and generally do not require any specific cardiac clearance. It is also generally accepted that for simple extractions and dental cleanings, there is no need to interrupt blood thinner therapy.  SBE prophylaxis is required for the patient from a cardiac standpoint. Patient does not have a penicillin allergy. Therefore, patient can take Amoxicillin 2,000mg  (four 500mg  tablets) one hour prior dental procedure/cleaning. If you need [place] to prescribe this, just let us know. Of note, patient was at the dentist office when we were called and was unaware that he needed SBE prophylaxis. Therefore, dental cleaning is getting rescheduled.  I will route this recommendation to the requesting party via Epic fax function and remove from pre-op pool.  Please call with questions.  Darreld Mclean, PA-C 02/26/2021, 2:22 PM

## 2021-03-03 ENCOUNTER — Other Ambulatory Visit: Payer: Self-pay | Admitting: *Deleted

## 2021-03-03 NOTE — Patient Outreach (Signed)
[place] [facility]) Care Management  03/03/2021  [name] [name] 01/30/1928 [id number]   Outgoing call placed to member, no answer, HIPAA compliant voice message left.  Will follow up within the next 3-4 business days.  [name], [place], MSN [place] [phone]

## 2021-03-06 ENCOUNTER — Other Ambulatory Visit: Payer: Self-pay | Admitting: *Deleted

## 2021-03-06 NOTE — Patient Outreach (Signed)
[place] [facility]) Care Management  03/06/2021  [name] [name] [birth year] [id number]   Outreach attempt #2, unsuccessful, HIPAA compliant voice message left.  Will send outreach letter and follow up within the next 3-4 business days.  [name], [place], MSN [place] [phone]

## 2021-03-09 ENCOUNTER — Other Ambulatory Visit: Payer: Self-pay | Admitting: *Deleted

## 2021-03-09 NOTE — Patient Outreach (Addendum)
[facility]) Care Management  03/09/2021  [name] [name] [birth year] [id number]   Incoming call received from member's wife.  Denies any urgent concerns, encouraged to contact this care manager with questions.  Agrees to follow up within the next month.   Goals Addressed             This Visit's Progress    [facility] - Find Help in My Community       Timeframe:  Short-Term Goal Priority:  High Start Date:        9/12                     Expected End Date:            11/12  Barriers: Knowledge             - think ahead to make sure my need does not become an emergency - make a list of family or friends that I can call    Why is this important?   Knowing how and where to find help for yourself or family in your neighborhood and community is an important skill.  You will want to take some steps to learn how.    Notes:   9/12 - Wife will have knee surgery next month, looking into having personal care assistance for both herself and member during her recovery time.  Advised to call if she is in need of additional choices, has chosen [employer] at this time.  Also encouraged to notify this care manager if transportation needs for member arise during this time     [facility] - Improve My Heart Health-Coronary Artery Disease   On track    Timeframe:  Long-Range Goal Priority:  High Start Date:        4/7  (date reset)            Expected End Date:      10/7    Barriers: Knowledge    - be open to making changes - learn about small changes that will make a big difference    Why is this important?   Lifestyle changes are key to improving the blood flow to your heart. Think about the things you can change and set a goal to live healthy.  Remember, when the blood vessels to your heart start to get clogged you may not have any symptoms.  Over time, they can get worse.  Don't ignore the signs, like chest pain, and get help right away.     Notes:   Encouraged to  contact MD with any potential complications  3/4 - Discussed medication changes with wife, encouraged to monitor BP, HR, and weights daily  4/7 - Discussed plan to have member participate in cardiac rehab  5/13 - Reviewed blood pressure trends, remain low (90-110s/60-70's).  Confirms he is building endurance, will start rehab next week  6/21 - Remains active with cardiac rehab, next session tomorrow.    8/1 - Per wife, member completed cardiac rehab, but they are recommending a balance program.  She will discuss with provider and possibly have outpatient PT ordered  9/12 - Wife state member has been able to perform more independent tasks, getting better daily.  Blood pressure has remained stable, 824-235 systolic/70's       [name], Therapist, sports, MSN [place] [phone]

## 2021-03-12 ENCOUNTER — Ambulatory Visit: Payer: Self-pay | Admitting: *Deleted

## 2021-03-24 DIAGNOSIS — C44712 Basal cell carcinoma of skin of right lower limb, including hip: Secondary | ICD-10-CM | POA: Diagnosis not present

## 2021-03-24 DIAGNOSIS — D485 Neoplasm of uncertain behavior of skin: Secondary | ICD-10-CM | POA: Diagnosis not present

## 2021-03-24 DIAGNOSIS — L570 Actinic keratosis: Secondary | ICD-10-CM | POA: Diagnosis not present

## 2021-03-24 DIAGNOSIS — Z85828 Personal history of other malignant neoplasm of skin: Secondary | ICD-10-CM | POA: Diagnosis not present

## 2021-03-24 DIAGNOSIS — D225 Melanocytic nevi of trunk: Secondary | ICD-10-CM | POA: Diagnosis not present

## 2021-03-24 DIAGNOSIS — L814 Other melanin hyperpigmentation: Secondary | ICD-10-CM | POA: Diagnosis not present

## 2021-03-24 DIAGNOSIS — L578 Other skin changes due to chronic exposure to nonionizing radiation: Secondary | ICD-10-CM | POA: Diagnosis not present

## 2021-03-24 DIAGNOSIS — L821 Other seborrheic keratosis: Secondary | ICD-10-CM | POA: Diagnosis not present

## 2021-03-24 DIAGNOSIS — C44311 Basal cell carcinoma of skin of nose: Secondary | ICD-10-CM | POA: Diagnosis not present

## 2021-04-06 ENCOUNTER — Other Ambulatory Visit: Payer: Self-pay | Admitting: *Deleted

## 2021-04-06 NOTE — Patient Outreach (Signed)
[place] [facility]) Care Management  04/06/2021  [name] [name] 12-09-1927 [id number]  Outgoing call placed to member's wife.  She report she is currently checking in to the hospital for her surgery, unable to talk.  She will return call once she is discharged home.  Call placed to member directly, no answer.  Will follow up with member/wife within the next week.  [name], [place], MSN [place] [phone]

## 2021-04-09 DIAGNOSIS — R339 Retention of urine, unspecified: Secondary | ICD-10-CM | POA: Diagnosis not present

## 2021-04-13 ENCOUNTER — Other Ambulatory Visit: Payer: Self-pay | Admitting: *Deleted

## 2021-04-13 NOTE — Patient Outreach (Signed)
[place] [facility]) Care Management  04/13/2021  [name] [name] 28-Oct-1927 [id number]   Outgoing call placed to member/wife, no answer, HIPAA compliant voice message left.  Will follow up within the next 2-3 business days.  [name], [place], MSN [place] [phone]

## 2021-04-16 ENCOUNTER — Other Ambulatory Visit: Payer: Self-pay | Admitting: *Deleted

## 2021-04-16 DIAGNOSIS — I25709 Atherosclerosis of coronary artery bypass graft(s), unspecified, with unspecified angina pectoris: Secondary | ICD-10-CM

## 2021-04-16 NOTE — Patient Outreach (Signed)
[place] [facility]) Care Management  04/16/2021  Teshaun Olarte [birth year] [id number]   Outgoing attempt #2, successful to wife.  State she is recovering from her surgery, member is doing well.  Denies any urgent concerns, encouraged to contact this care manager with questions.  Agrees to follow up within the next month.   Goals Addressed             This Visit's Progress    THN - Find Help in My Community       Timeframe:  Short-Term Goal Priority:  High Start Date:        9/12                     Expected End Date:            11/12  Barriers: Knowledge             - think ahead to make sure my need does not become an emergency - make a list of family or friends that I can call    Why is this important?   Knowing how and where to find help for yourself or family in your neighborhood and community is an important skill.  You will want to take some steps to learn how.    Notes:   9/12 - Wife will have knee surgery next month, looking into having personal care assistance for both herself and member during her recovery time.  Advised to call if she is in need of additional choices, has chosen [employer] at this time.  Also encouraged to notify this care manager if transportation needs for member arise during this time  10/20 - Wife confirms she had surgery and is back home recovering.  Will have [employer] in the home for another week, encouraged to consider accepting the help of friends family after while she continues to recover.  Referral placed to Care Guide for transportation resources as wife is unable to drive during recovery, unable to take herself or member to MD appointments.       [name], [place], MSN [place] [phone]

## 2021-04-24 ENCOUNTER — Telehealth: Payer: Self-pay

## 2021-04-24 NOTE — Telephone Encounter (Signed)
   Telephone encounter was:  Successful.  04/24/2021 Name: [name] [name] MRN: [id number] DOB: Dec 15, 1927  [name] [name] is a 87 y.o. year old male who is a primary care patient of Cari Caraway, MD . The community resource team was consulted for assistance with Transportation Needs   Care guide performed the following interventions:  Service needs for spouse and not patient. However, spouse was provided with transportation resources. She stated she may have a ride next week and will check her insurance benefit with [employer]. If neither works she will contact PCP for a referral to [employer]  Follow Up Plan:  No further follow up planned at this time. The patient has been provided with needed resources.  [place] management  [place], [place]  Main Phone: [phone]  E-mail: [email or web address]  Website: [email or web address]

## 2021-05-14 ENCOUNTER — Other Ambulatory Visit: Payer: Self-pay | Admitting: *Deleted

## 2021-05-14 NOTE — Patient Outreach (Signed)
[facility]) Care Management  05/14/2021  Jolan Upchurch 1928-06-25 [id number]   Outgoing call placed to member's wife, no answer, HIPAA compliant voice message left.  Will follow up within the next 3-4 business days.  [name], [place], MSN [place] [phone]

## 2021-05-19 ENCOUNTER — Other Ambulatory Visit: Payer: Self-pay | Admitting: *Deleted

## 2021-05-19 NOTE — Patient Outreach (Addendum)
[facility]) Care Management  05/19/2021  Gentry Pilson 02/15/1928 [id number]   Outreach attempt #2, unsuccessful, HIPAA compliant voice message left.  Will send outreach letter and follow up within the next 3-4 business days.    Update:  Incoming call received back from wife.  Denies any urgent concerns, encouraged to contact this care manager with questions.  Agrees to follow up within the next month.   Care Plan : Coronary Artery Disease (Adult)  Updates made by [name], RN since 05/19/2021 12:00 AM     Problem: Adjustment to Life Changing Event (Coronary Artery Disease)      Long-Range Goal: Member/wife will report ongoing improvement/recovery from surgery as evidenced by member's ability to increase activity and independence   Start Date: 03/09/2021  Expected End Date: 07/07/2021  This Visit's Progress: On track  Recent Progress: On track  Priority: High  Note:   Current Barriers:  Knowledge Deficits related to plan of care for management of CAD  Care Coordination needs related to Limited access to caregiver  potential problem post wife's surgery  RNCM Clinical Goal(s):  Patient will verbalize understanding of plan for management of CAD take all medications exactly as prescribed and will call provider for medication related questions work with [employer]  to provide in home assistance post wife's surgery  through collaboration with RN Care manager, provider, and care team.   Interventions: Inter-disciplinary care team collaboration (see longitudinal plan of care) Evaluation of current treatment plan related to  self management and patient's adherence to plan as established by provider   CAD  (Status: Goal on track: YES.) Assessed understanding of CAD diagnosis Medications reviewed including medications utilized in CAD treatment plan Provided education on importance of blood pressure control in management of CAD; Provided education on  Importance of limiting foods high in cholesterol; Reviewed Importance of taking all medications as prescribed Reviewed Importance of attending all scheduled provider appointments  Patient Goals/Self-Care Activities: Patient will self administer medications as prescribed Patient will attend all scheduled provider appointments Patient will continue to perform ADL's independently Patient will continue to perform IADL's independently    Update 11/22 - Per wife, member is doing well with the exception of ongoing feelings of intermittent weakness.  Blood pressure remains low but per wife, stable.  He has follow up appointment with cardiology on 12/1.  Denies member has had any chest pain or discomfort, continue to monitor diet for low salt, hearth healthy intake.        [name], [place], MSN [place] [phone]

## 2021-05-28 ENCOUNTER — Other Ambulatory Visit: Payer: Self-pay

## 2021-05-28 ENCOUNTER — Ambulatory Visit (HOSPITAL_COMMUNITY)
Admission: RE | Admit: 2021-05-28 | Discharge: 2021-05-28 | Disposition: A | Discharge status: Home / Self Care | Payer: Medicare HMO | Source: Ambulatory Visit | Attending: Internal Medicine | Admitting: Internal Medicine

## 2021-05-28 VITALS — BP 99/61 | HR 61 | Wt 181.2 lb

## 2021-05-28 DIAGNOSIS — I480 Paroxysmal atrial fibrillation: Secondary | ICD-10-CM

## 2021-05-28 DIAGNOSIS — N1831 Chronic kidney disease, stage 3a: Secondary | ICD-10-CM | POA: Insufficient documentation

## 2021-05-28 DIAGNOSIS — I5082 Biventricular heart failure: Secondary | ICD-10-CM | POA: Diagnosis not present

## 2021-05-28 DIAGNOSIS — Z952 Presence of prosthetic heart valve: Secondary | ICD-10-CM | POA: Diagnosis not present

## 2021-05-28 DIAGNOSIS — Z7982 Long term (current) use of aspirin: Secondary | ICD-10-CM | POA: Diagnosis not present

## 2021-05-28 DIAGNOSIS — Z951 Presence of aortocoronary bypass graft: Secondary | ICD-10-CM | POA: Diagnosis not present

## 2021-05-28 DIAGNOSIS — I350 Nonrheumatic aortic (valve) stenosis: Secondary | ICD-10-CM | POA: Diagnosis not present

## 2021-05-28 DIAGNOSIS — I2510 Atherosclerotic heart disease of native coronary artery without angina pectoris: Secondary | ICD-10-CM | POA: Diagnosis not present

## 2021-05-28 DIAGNOSIS — R0683 Snoring: Secondary | ICD-10-CM | POA: Diagnosis not present

## 2021-05-28 DIAGNOSIS — I130 Hypertensive heart and chronic kidney disease with heart failure and stage 1 through stage 4 chronic kidney disease, or unspecified chronic kidney disease: Secondary | ICD-10-CM | POA: Insufficient documentation

## 2021-05-28 DIAGNOSIS — R339 Retention of urine, unspecified: Secondary | ICD-10-CM | POA: Insufficient documentation

## 2021-05-28 DIAGNOSIS — R5383 Other fatigue: Secondary | ICD-10-CM | POA: Diagnosis not present

## 2021-05-28 DIAGNOSIS — Z7901 Long term (current) use of anticoagulants: Secondary | ICD-10-CM | POA: Diagnosis not present

## 2021-05-28 DIAGNOSIS — I4891 Unspecified atrial fibrillation: Secondary | ICD-10-CM | POA: Insufficient documentation

## 2021-05-28 DIAGNOSIS — I5042 Chronic combined systolic (congestive) and diastolic (congestive) heart failure: Secondary | ICD-10-CM

## 2021-05-28 LAB — CBC
HCT: 38.4 % — ABNORMAL LOW (ref 39.0–52.0)
Hemoglobin: 13.1 g/dL (ref 13.0–17.0)
MCH: 32 pg (ref 26.0–34.0)
MCHC: 34.1 g/dL (ref 30.0–36.0)
MCV: 93.7 fL (ref 80.0–100.0)
Platelets: 155 10*3/uL (ref 150–400)
RBC: 4.1 MIL/uL — ABNORMAL LOW (ref 4.22–5.81)
RDW: 14 % (ref 11.5–15.5)
WBC: 6.2 10*3/uL (ref 4.0–10.5)
nRBC: 0 % (ref 0.0–0.2)

## 2021-05-28 LAB — BRAIN NATRIURETIC PEPTIDE: B Natriuretic Peptide: 917.1 pg/mL — ABNORMAL HIGH (ref 0.0–100.0)

## 2021-05-28 LAB — COMPREHENSIVE METABOLIC PANEL
ALT: 15 U/L (ref 0–44)
AST: 28 U/L (ref 15–41)
Albumin: 3.7 g/dL (ref 3.5–5.0)
Alkaline Phosphatase: 55 U/L (ref 38–126)
Anion gap: 8 (ref 5–15)
BUN: 22 mg/dL (ref 8–23)
CO2: 28 mmol/L (ref 22–32)
Calcium: 9 mg/dL (ref 8.9–10.3)
Chloride: 99 mmol/L (ref 98–111)
Creatinine, Ser: 1.21 mg/dL (ref 0.61–1.24)
GFR, Estimated: 57 mL/min — ABNORMAL LOW (ref >60)
Glucose, Bld: 117 mg/dL — ABNORMAL HIGH (ref 70–99)
Potassium: 4.2 mmol/L (ref 3.5–5.1)
Sodium: 135 mmol/L (ref 135–145)
Total Bilirubin: 0.8 mg/dL (ref 0.3–1.2)
Total Protein: 6.8 g/dL (ref 6.5–8.1)

## 2021-05-28 LAB — TSH: TSH: 1.477 u[IU]/mL (ref 0.350–4.500)

## 2021-05-28 MED ORDER — LOSARTAN POTASSIUM 25 MG PO TABS: 12.5000 mg | ORAL_TABLET | Freq: Every day | ORAL | 10 refills | Status: DC

## 2021-05-28 NOTE — Patient Instructions (Signed)
Medication Changes:  Decrease Losartan to 12.5mg  (1/2 TAB) at bedtime  Lab Work:  Labs done today, your results will be available in MyChart, we will contact you for abnormal readings.   Testing/Procedures:  Your provider has recommended that you have a home sleep study.  We have provided you with the equipment in our office today. Please download the app and follow the instructions. YOUR PIN NUMBER IS: [id number]. Once you have completed the test you just dispose of the equipment, the information is automatically uploaded to [place] via blue-tooth technology. If your test is positive for sleep apnea and you need a home CPAP machine you will be contacted by Dr Theodosia Blender office [facility]) to set this up.   Referrals:  none   Special Instructions // Education:    Follow-Up in: 9 months with Echocardiogram (September 2023) ** Call in July to make an appointment**   At the [facility], you and your health needs are our priority. We have a designated team specialized in the treatment of Heart Failure. This Care Team includes your primary Heart Failure Specialized Cardiologist (physician), Advanced Practice Providers (APPs- Physician Assistants and Nurse Practitioners), and Pharmacist who all work together to provide you with the care you need, when you need it.   You may see any of the following providers on your designated Care Team at your next follow up:  Dr Glori Bickers Dr Haynes Kerns, NP Lyda Jester, [place] [facility] [place], [place] Audry Riles, PharmD   Please be sure to bring in all your medications bottles to every appointment.   Need to Contact us:  If you have any questions or concerns before your next appointment please send [place] a message through [place] or call our office at [phone].    TO LEAVE A MESSAGE FOR THE NURSE SELECT OPTION 2, PLEASE LEAVE A MESSAGE INCLUDING: YOUR NAME DATE OF BIRTH CALL BACK NUMBER REASON  FOR CALL**this is important as we prioritize the call backs  YOU WILL RECEIVE A CALL BACK THE SAME DAY AS LONG AS YOU CALL BEFORE 4:00 PM

## 2021-05-28 NOTE — Progress Notes (Signed)
Advanced Heart Failure Clinic Note   PCP: Cari Caraway, MD PCP-Cardiologist: Peter Martinique, MD  HF Cardiologist: Dr. Haroldine Laws  HPI:  [name] [name] is a 87 y/o male, previously very active despite age before recent illness. Had been working 20-30 hr/week for [employer] and [employer].    Admitted in 11/21 with progressive decline in EF to 30-35% w/ global HK. RV systolic function mildly reduced. Also noted to have mod-severe AS.  Had subsequent R/LHC showing severe 3V CAD. C   Underwent CABG x 4 (LIMA-LAD, sequential SVG-OM1 and OM2, SVG- PDA) + tissue AVR by Dr. Cyndia Bent on 05/23/20. Post operative course c/b atrial fibrillation treated w/ amiodarone + difficulty weaning inotropes, fluid overload and hypotension. AHF consult to assist with management. He was discharged to [facility] 06/26/20. His discharge weight was 168 lbs.  Here today with his wife for f/u. Has a list of issues to discuss. Says he feels bogged down like he is wearing armor. Not much energy. Will go to gym with his wife occasionally but very tired after. Says he felt much better when he was going to cardiac rehab. Says he has a runny nose when he eats. Feels groggy when he wakes up. Snores a lot. Denies CP, edema, orthopnea or PND.   ROS: All systems reviewed and negative except as per HPI.   Past Medical History:  Diagnosis Date   Aortic stenosis    Arthritis    CAD (coronary artery disease)    CHF (congestive heart failure) (HCC)    Colitis    Edema    Essential hypertension    no med now   Essential tremor    Hyperlipidemia    Hypotonic bladder    L4-L5   Obstructive uropathy    Peripheral neuropathy    Elevated by Dr. love 2010   Prostate cancer [facility]) 2005    Current Outpatient Medications  Medication Sig Dispense Refill   apixaban (ELIQUIS) 5 MG TABS tablet Take 1 tablet by mouth twice daily 180 tablet 1   Ascorbic Acid (VITAMIN C) 100 MG tablet Take 100 mg by mouth daily.     aspirin 81 MG  tablet Take 81 mg by mouth at bedtime.      Cholecalciferol (VITAMIN D) 50 MCG (2000 UT) tablet Take 1 tablet by mouth daily.     docusate sodium (COLACE) 100 MG capsule Take 100 mg by mouth daily as needed for mild constipation.     dutasteride (AVODART) 0.5 MG capsule Take 0.5 mg by mouth daily.     feeding supplement, ENSURE COMPLETE, (ENSURE COMPLETE) LIQD Take 237 mLs by mouth 2 (two) times daily between meals.     ferrous [id number] C-folic acid (TRINSICON / FOLTRIN) capsule Take 1 capsule by mouth 2 (two) times daily after a meal.     losartan (COZAAR) 25 MG tablet Take 1 tablet (25 mg total) by mouth at bedtime. 90 tablet 3   melatonin 5 MG TABS Take 1 tablet (5 mg total) by mouth at bedtime as needed (sleep).  0   Multiple Vitamins-Minerals (CENTRUM SILVER PO) Take 1 tablet by mouth daily.      sertraline (ZOLOFT) 50 MG tablet Take 1 tablet by mouth daily.     sulfaSALAzine (AZULFIDINE) 500 MG tablet Take 2,000 mg by mouth daily.     tamsulosin (FLOMAX) 0.4 MG CAPS Take 0.4 mg by mouth 2 (two) times daily.      vitamin B-12 (CYANOCOBALAMIN) 100 MCG tablet Take 1 tablet  by mouth daily.     No current facility-administered medications for this encounter.   Allergies  Allergen Reactions   Simvastatin Other (See Comments)    "caused neuropathy pain"    Social History   Socioeconomic History   Marital status: Married    Spouse name: Not on file   Number of children: Not on file   Years of education: 18   Highest education level: Master's degree (e.g., MA, MS, MEng, MEd, MSW, MBA)  Occupational History   Not on file  Tobacco Use   Smoking status: Never   Smokeless tobacco: Never  Vaping Use   Vaping Use: Never used  Substance and Sexual Activity   Alcohol use: Yes    Alcohol/week: 1.0 standard drink    Types: 1 Glasses of wine per week    Comment: occ   Drug use: No   Sexual activity: Yes  Other Topics Concern   Not on file  Social History Narrative   Not  on file   Social Determinants of Health   Financial Resource Strain: Not on file  Food Insecurity: No Food Insecurity   Worried About Running Out of Food in the Last Year: Never true   Ran Out of Food in the Last Year: Never true  Transportation Needs: No Transportation Needs   Lack of Transportation (Medical): No   Lack of Transportation (Non-Medical): No  Physical Activity: Not on file  Stress: Not on file  Social Connections: Not on file  Intimate Partner Violence: Not on file   Family History  Problem Relation Age of Onset   Hypertension Mother    Heart failure Mother    Diabetes Mother    Heart attack Brother    Heart attack Maternal Aunt    Vitals:   05/28/21 1157  BP: 99/61  Pulse: 61  SpO2: 95%  Weight: 82.2 kg (181 lb 3.2 oz)    Wt Readings from Last 3 Encounters:  05/28/21 82.2 kg (181 lb 3.2 oz)  01/07/21 77.9 kg (171 lb 11.8 oz)  12/16/20 82.6 kg (182 lb)   PHYSICAL EXAM: General:  Elderly male  Breathing a bit labored (wife says it is normal) HEENT: normal Neck: supple. no JVD. Carotids 2+ bilat; no bruits. No lymphadenopathy or thryomegaly appreciated. Cor: PMI nondisplaced. Regular rate & rhythm. No rubs, gallops or murmurs. Lungs: clear Abdomen: soft, nontender, nondistended. No hepatosplenomegaly. No bruits or masses. Good bowel sounds. Extremities: no cyanosis, clubbing, rash, edema Neuro: alert & orientedx3, cranial nerves grossly intact. moves all 4 extremities w/o difficulty. Affect pleasant   ASSESSMENT & PLAN:  1. Severe 3V CAD: - s/p CABGx 4 (LIMA-LAD, sequential SVG-OM1 and OM2, SVG- PDA) - Continue ASA 81 mg daily. - No s/s angina. No on b-blokcer due to fatigue - Needs statin. Start crestor 5    2. Aortic Stenosis, s/p tissue AVR (05/23/20) - Severe low flow low gradient AS. - AV prosthesis ok on echo. - No evidence AS on exam   3.Chronic Biventricular Heart Failure:  - Echo in 2014 showed normal LVEF 55-60% w/ G1DD.  - Echo  10/21 and EF 30-35% w/ global HK. RV systolic function mildly reduced. - LHC this admit w/ severe 3V CAD>>ICM. Now s/p CABG. Also w/ severe AS, possibly contributing to LV dysfunction, now s/p SAVR.  - Post-op echo 12/1 EF 30-35%, RV mildly reduced. AV prothesis ok. - Portable echo (1/22): EF 30-35%  - 11/05/2020 ECHO with EF 35-40%, mild-mod MR, Moderately reduced RV function  Personally reviewed - He is worse today NYHA III. Mostly due to fatigue - Volume status mildly elevated. Encouraged him to take more lasix as needed.  - BP low. Decrease losartan to 12.5 qhs - Off digoxin with heart block.  - No beta blocker with AV block. - Not good SGLT2i candidate with self caths  - Not on spiro with low BP - Check labs. Encouraged more activity     4. Atrial Fibrillation, post op.   - In NSR  - Amio stopped - Continue Eliquis 5 mg bid.  - No bleeding  5. CKD IIIa - Creatinine 1.2 recent check .  - check labs today   6. Urinary Retention - self caths 3x daily  7. Snoring/fatigue - high suspicion for OSA - check home sleep study    [name] Bickers MD 12:41 PM

## 2021-06-04 DIAGNOSIS — R339 Retention of urine, unspecified: Secondary | ICD-10-CM | POA: Diagnosis not present

## 2021-06-05 DIAGNOSIS — Z481 Encounter for planned postprocedural wound closure: Secondary | ICD-10-CM | POA: Diagnosis not present

## 2021-06-05 DIAGNOSIS — C4491 Basal cell carcinoma of skin, unspecified: Secondary | ICD-10-CM | POA: Diagnosis not present

## 2021-06-05 DIAGNOSIS — C44311 Basal cell carcinoma of skin of nose: Secondary | ICD-10-CM | POA: Diagnosis not present

## 2021-06-05 DIAGNOSIS — C44319 Basal cell carcinoma of skin of other parts of face: Secondary | ICD-10-CM | POA: Diagnosis not present

## 2021-06-16 DIAGNOSIS — I872 Venous insufficiency (chronic) (peripheral): Secondary | ICD-10-CM | POA: Diagnosis not present

## 2021-06-16 DIAGNOSIS — C44712 Basal cell carcinoma of skin of right lower limb, including hip: Secondary | ICD-10-CM | POA: Diagnosis not present

## 2021-06-17 ENCOUNTER — Other Ambulatory Visit: Payer: Self-pay | Admitting: *Deleted

## 2021-06-17 ENCOUNTER — Other Ambulatory Visit: Payer: Self-pay | Admitting: Cardiology

## 2021-06-17 NOTE — Patient Outreach (Signed)
[place] [facility]) Care Management  06/17/2021  [name] [name] 12-23-1927 [id number]   Outgoing call placed to member/wife, no answer, HIPAA compliant voice message left.  Will follow up within the next 3-4 business days.  [name], RN, MSN, [employer] Manager [phone]

## 2021-06-18 NOTE — Telephone Encounter (Signed)
PER LABS ON 06/01/21 DR Donata Clay REQUESTED TO RESTART LASIX 40MG 

## 2021-06-23 ENCOUNTER — Other Ambulatory Visit: Payer: Self-pay | Admitting: *Deleted

## 2021-06-23 NOTE — Patient Outreach (Signed)
[name] [facility]) Care Management  06/23/2021  [name] [name] 1927-10-28 [id number]   Outreach attempt #2, unsuccessful, HIPAA compliant voice message left.  Will send outreach letter and follow up within the next 3-4 business days.  [name], RN, MSN, [place] Manager [phone]

## 2021-06-30 ENCOUNTER — Other Ambulatory Visit: Payer: Self-pay | Admitting: *Deleted

## 2021-06-30 NOTE — Patient Outreach (Signed)
[facility]) Care Management  06/30/2021  [name] [name] [birth year] [id number]   Outreach attempt #3 to member/wife, unsuccessful, HIPAA compliant voice message left.  Will make 4th and final attempt within the next 4 weeks, if remain unsuccessful will close case due to inability to maintain contact.   [name], RN, MSN, [place] Manager [phone]

## 2021-07-05 NOTE — Progress Notes (Signed)
Cardiology Office Note   Date:  07/14/2021   ID:  [name] [name], DOB 10/12/1927, MRN [id number]  PCP:  Cari Caraway, MD  Cardiologist:   Zilla Shartzer Martinique, MD   Chief Complaint  Patient presents with   Congestive Heart Failure       History of Present Illness: [name] [name] is a 88 y.o. male who is seen for follow up CHF, AS. He has a history of venous insufficiency with chronic lower extremity edema,hypertension, hyperlipidemia, peripheral neuropathy, essential tremor, and prostate cancer.   Patient first seen  in 09/2012 for evaluation of dyspnea on exertion and lower extremity edema, especially in his left leg. Venous dopplers were ordered and showed no DVT. Echo showed LVEF of 55-60% with moderate asymmetric basal septal hypertrophy with no LV outflow tract gradient and mild to moderate MR. He was seen  in 07/2017 at which time he was doing well. He denied any chest pain or shortness of breath. He continued to report some lower extremity edema that resolved with elevation of legs at night. Edema felt to be due to venous insufficiency and conservative management with sodium restriction, elevation of legs, and compression stockings was recommended.    He was diagnosed with right middle lobe pneumonia on 10/12/2019 after presenting for further evaluation of fatigue while on vacation at the beach.. He tested negative for COVID. He was started on Levaquin but this had to be stopped when he developed tendonitis. He was started on Azithromycin instead on 10/26/2019. Repeat chest x-ray on 10/26/2019 showed increased density of right lower lobe pneumonia. He had persistent fatigue  and developed significant lower extremity edema and weight gain. Patient was seen by PCP again on 11/14/2019. Repeat chest x-ray showed continued right basilar pneumonia and increased right pleural effusion. BNP was checked and came back elevated at 1,380. He was started on Lasix 45m daily. Chest CT was also ordered to rule out  underlying lung mass. CT was performed on 11/15/2019 and showed mild cardiomegaly, moderate right pleural effusion with adjacent atelectasis or pneumonia in the right lower and middle lobes as well as coronary artery calcifications. Of note, prior to being started on Lasix, he was seen by Urology for urinary retention and had a catheter placed.  This was later removed and he performed in and out cath.   He had an Echo done in June 2021 showing EF 40-45% with severe HK of the entire inferior wall. There was severe basal septal hypertrophy with gr 2 diastolic dysfunction. Moderate MR, mild AS and mild Aortic root enlargement. We started him on losartan 25 mg daily but dose had to be reduced to 12.5 mg due to hypotension. He was seen in September with worsening CHF with weight gain, edema and worsening fatigue. Lasix dose was increased. Echo showed further decline in LV function with EF 30-35%. AS was felt to be moderate to severe ( low flow AS).   He subsequently underwent cardiac cath. This demonstrated severe 3 vessel CAD with complex anatomy as well as low flow severe AS.  He was seen in consultation with Dr BCyndia Bentand underwent combined AVR with a #29 Edwards Inspiris Resilia valve and CABG with LIMA to the LAD, sequential SVG to OM1 and OM2, and SVG to the PDA on 05/23/20.  His post op course was complicated by persistent low output CHF and hypotension. No beta blocker due to hypotension. No digoxin due to some AV block. Was followed inpatient by the Heart failure service. Was on  Dobutamine until Dec 18 then weaned off. He had some post op Afib but converted to NSR. He was placed on midodrine for hypotension. He had significant debility and was evaluated by OT an PT. He was treated for PNA. Repeat Echo showed EF 35% with good AVR prosthesis function. He was DC to SNF on 06/26/20.   Followed in Advanced heart failure clinic. Was felt to be in Afib and DCCV arranged but when he presented to the hospital he was  in NSR.   When last seen we started him on Entresto. He was seen in the Advanced heart failure clinic on 5/11 and was experiencing orthostasis and was switched back to losartan. Amiodarone discontinued. Was on lasix about every other day. Echo was repeated with EF 35-40%. Stable AV prosthesis. Mild to mod MR. Moderate RV dysfunction.   Was seen in Advanced heart failure clinic in Dec. Noted symptoms of fatigue. Losartan dose reduced due to low BP. Home sleep study recommended but they never followed through. BNP elevated at 917 and it was recommended he resume lasix twice a week and PRN.  He is seen with his wife today. His major complaint is of fatigue. No difference with reduction in losartan dose. He is taking lasix daily and sometimes takes it twice a day. Swelling is doing well. No chest pain, palpitations, orthopnea. He is able to walk around his kitchen and bedroom 35 times.  Self caths for urinary retention.  Past Medical History:  Diagnosis Date   Aortic stenosis    Arthritis    CAD (coronary artery disease)    CHF (congestive heart failure) (HCC)    Colitis    Edema    Essential hypertension    no med now   Essential tremor    Hyperlipidemia    Hypotonic bladder    L4-L5   Obstructive uropathy    Peripheral neuropathy    Elevated by Dr. love 2010   Prostate cancer [facility]) 2005    Past Surgical History:  Procedure Laterality Date   ACHILLES TENDON SURGERY Bilateral 1995   AORTIC VALVE REPLACEMENT N/A 05/23/2020   Procedure: AORTIC VALVE REPLACEMENT (AVR) USING EDWARDS RESILIA 29 MM AORTIC VALVE.;  Surgeon: Gaye Pollack, MD;  Location: [facility];  Service: Open Heart Surgery;  Laterality: N/A;   CARDIAC CATHETERIZATION     COLONOSCOPY     With polyp resection   CORONARY ARTERY BYPASS GRAFT N/A 05/23/2020   Procedure: CORONARY ARTERY BYPASS GRAFTING (CABG) USING LIMA to LAD; ENDOSCOPIC HARVESTED RIGHT GREATER SAPHENOUS VEIN: SVG to PD; SVG to SEQUENCED INTERMEDIATE to OM;   Surgeon: Gaye Pollack, MD;  Location: [place];  Service: Open Heart Surgery;  Laterality: N/A;   ENDOVEIN HARVEST OF GREATER SAPHENOUS VEIN Right 05/23/2020   Procedure: ENDOVEIN HARVEST OF GREATER SAPHENOUS VEIN;  Surgeon: Gaye Pollack, MD;  Location: [place];  Service: Open Heart Surgery;  Laterality: Right;   REVERSE SHOULDER ARTHROPLASTY Right 06/04/2016   REVERSE SHOULDER ARTHROPLASTY Right 06/04/2016   Procedure: REVERSE SHOULDER ARTHROPLASTY;  Surgeon: Netta Cedars, MD;  Location: [place];  Service: Orthopedics;  Laterality: Right;   RIGHT/LEFT HEART CATH AND CORONARY ANGIOGRAPHY N/A 05/20/2020   Procedure: RIGHT/LEFT HEART CATH AND CORONARY ANGIOGRAPHY;  Surgeon: Martinique, Sosaia Pittinger M, MD;  Location: [facility];  Service: Cardiovascular;  Laterality: N/A;   SKIN CANCER EXCISION  1974   On chest wall   TEE WITHOUT CARDIOVERSION N/A 05/23/2020   Procedure: TRANSESOPHAGEAL ECHOCARDIOGRAM (TEE);  Surgeon:  Gaye Pollack, MD;  Location: [facility];  Service: Open Heart Surgery;  Laterality: N/A;   TOTAL KNEE ARTHROPLASTY Right 2011   TOTAL KNEE ARTHROPLASTY Left 04/09/2013   Procedure: LEFT TOTAL KNEE ARTHROPLASTY;  Surgeon: Gearlean Alf, MD;  Location: [facility];  Service: Orthopedics;  Laterality: Left;     Current Outpatient Medications  Medication Sig Dispense Refill   apixaban (ELIQUIS) 5 MG TABS tablet Take 1 tablet by mouth twice daily 180 tablet 1   Ascorbic Acid (VITAMIN C) 100 MG tablet Take 100 mg by mouth daily.     aspirin 81 MG tablet Take 81 mg by mouth at bedtime.      Cholecalciferol (VITAMIN D) 50 MCG (2000 UT) tablet Take 1 tablet by mouth daily.     docusate sodium (COLACE) 100 MG capsule Take 100 mg by mouth daily as needed for mild constipation.     dutasteride (AVODART) 0.5 MG capsule Take 0.5 mg by mouth daily.     feeding supplement, ENSURE COMPLETE, (ENSURE COMPLETE) LIQD Take 237 mLs by mouth 2 (two) times daily between meals.     ferrous [id number]  C-folic acid (TRINSICON / FOLTRIN) capsule Take 1 capsule by mouth 2 (two) times daily after a meal.     furosemide (LASIX) 40 MG tablet Take 1 tablet by mouth twice daily 60 tablet 3   losartan (COZAAR) 25 MG tablet Take 0.5 tablets (12.5 mg total) by mouth at bedtime. 15 tablet 10   melatonin 5 MG TABS Take 1 tablet (5 mg total) by mouth at bedtime as needed (sleep).  0   Multiple Vitamins-Minerals (CENTRUM SILVER PO) Take 1 tablet by mouth daily.      sertraline (ZOLOFT) 50 MG tablet Take 1 tablet by mouth daily.     sulfaSALAzine (AZULFIDINE) 500 MG tablet Take 2,000 mg by mouth daily.     tamsulosin (FLOMAX) 0.4 MG CAPS Take 0.4 mg by mouth 2 (two) times daily.      vitamin B-12 (CYANOCOBALAMIN) 100 MCG tablet Take 1 tablet by mouth daily.     No current facility-administered medications for this visit.    Allergies:   Simvastatin    Social History:  The patient  reports that he has never smoked. He has never used smokeless tobacco. He reports current alcohol use of about 1.0 standard drink per week. He reports that he does not use drugs.   Family History:  The patient's family history includes Diabetes in his mother; Heart attack in his brother and maternal aunt; Heart failure in his mother; Hypertension in his mother.    ROS:  Please see the history of present illness.   Otherwise, review of systems are positive for none.   All other systems are reviewed and negative.    PHYSICAL EXAM: VS:  BP 112/62    Pulse 65    Ht 5' 9.25" (1.759 m)    Wt 179 lb 8 oz (81.4 kg)    SpO2 97%    BMI 26.32 kg/m  , BMI Body mass index is 26.32 kg/m. GEN: elderly WM, well developed, in no acute distress  HEENT: normal  Neck: + JVD to 6 cm, no carotid bruits, or masses Cardiac: RRR; gr 1/6 systolic murmur at the RUSB. No  rubs, or gallops.  Respiratory:  Bilateral reduced BS with dullness to percussion.  GI  soft, nontender, nondistended, + BS MS: no deformity or atrophy  Skin: warm and dry, no  rash Ext: tr-1+ LE edema  pretibial Neuro:  Strength and sensation are intact Psych: euthymic mood, full affect   EKG:  EKG is ordered today. The ekg ordered today demonstrates Afib with rate 65. LAFB, RBBB. I have personally reviewed and interpreted this study.    Recent Labs: 05/28/2021: ALT 15; B Natriuretic Peptide 917.1; BUN 22; Creatinine, Ser 1.21; Hemoglobin 13.1; Platelets 155; Potassium 4.2; Sodium 135; TSH 1.477    Lipid Panel No results found for: CHOL, TRIG, HDL, CHOLHDL, VLDL, LDLCALC, LDLDIRECT    Wt Readings from Last 3 Encounters:  07/14/21 179 lb 8 oz (81.4 kg)  05/28/21 181 lb 3.2 oz (82.2 kg)  01/07/21 171 lb 11.8 oz (77.9 kg)    - 10/12/2019: WBC 7.6, Hgb 14.2, Plts 246. Na 140, K 4.4, Glucose 103, BUN 12, Cr 1.26. AST 29, ALT 21, Alk Phos 61, Total Bili 0.5. TSH 0.849. UA showed 27m of protein but was otherwise unremarkable.  - 10/26/2019: Cr 1.20.  - 11/14/2019: BNP 1,380. Na 140, K 4.6, Glucose 107, BUN 22, Cr 1.16. AST 26, ALT, Alk Phos 59.  Dated 12/06/19: BUN 27, creatinine 1.4. CBC and CMET normal.  Labs dated1/20/22: BUN 18, creatinine 1.2. Lytes OK. Hgb 11.1. normal WBC. BNP 2010.   CXR report 07/15/20: moderate bilateral pleural effusions.   Other studies Reviewed: Additional studies/ records that were reviewed today include:  Echo 12/10/19: IMPRESSIONS     1. Left ventricular ejection fraction, by estimation, is 40 to 45%. The  left ventricle has mildly decreased function. The left ventricle  demonstrates global hypokinesis. There is severe left ventricular  hypertrophy of the basal-septal segment. Left  ventricular diastolic parameters are consistent with Grade II diastolic  dysfunction (pseudonormalization). There is severe hypokinesis of the left  ventricular, entire inferior wall and inferoseptal wall.   2. Right ventricular systolic function is normal. The right ventricular  size is mildly enlarged. There is normal pulmonary artery systolic   pressure. The estimated right ventricular systolic pressure is 358.5mmHg.   3. Left atrial size was mild to moderately dilated.   4. The mitral valve is normal in structure. Moderate mitral valve  regurgitation. No evidence of mitral stenosis.   5. The aortic valve is tricuspid. Aortic valve regurgitation is not  visualized. Mild aortic valve stenosis. Aortic valve area, by VTI measures  1.51 cm. Aortic valve mean gradient measures 20.0 mmHg. Aortic valve Vmax  measures 2.46 m/s. The degree of AS   may be underestimated in the setting of LV dysfunction. rree-=--   6. Aortic dilatation noted. There is mild dilatation of the aortic root  and of the ascending aorta measuring 42 mm and 472mrespectively.   7. The inferior vena cava is dilated in size with >50% respiratory  variability, suggesting right atrial pressure of 8 mmHg.    Echo 04/01/20: IMPRESSIONS     1. LVEF has decreased from 12/10/2019, now 30-35% with LV dyssynchrony.  Aortic stenosis is most probably severe with low flow low gradient,  dimensionless index 0.26.   2. Left ventricular ejection fraction, by estimation, is 30 to 35%. The  left ventricle has moderately decreased function. The left ventricle  demonstrates global hypokinesis. The left ventricular internal cavity size  was mildly dilated. There is severe  asymmetric left ventricular hypertrophy of the basal-septal segment. Left  ventricular diastolic function could not be evaluated.   3. Right ventricular systolic function is mildly reduced. The right  ventricular size is mildly enlarged. There is moderately elevated  pulmonary artery  systolic pressure. The estimated right ventricular  systolic pressure is 82.5 mmHg.   4. Left atrial size was severely dilated.   5. Right atrial size was moderately dilated.   6. The mitral valve is normal in structure. Moderate mitral valve  regurgitation. No evidence of mitral stenosis.   7. The aortic valve is normal in  structure. Aortic valve regurgitation is  not visualized. Moderate to severe aortic valve stenosis. Aortic valve  mean gradient measures 24.0 mmHg.   8. Aortic dilatation noted. There is mild to moderate dilatation of the  ascending aorta, measuring 44 mm.   9. The inferior vena cava is dilated in size with <50% respiratory  variability, suggesting right atrial pressure of 15 mmHg.   Echo 11/05/20: IMPRESSIONS     1. Left ventricular ejection fraction, by estimation, is 35 to 40%. The  left ventricle has moderately decreased function. The left ventricle  demonstrates regional wall motion abnormalities (see scoring  diagram/findings for description). There is  moderate left ventricular hypertrophy. Left ventricular diastolic  parameters are consistent with Grade I diastolic dysfunction (impaired  relaxation). There is akinesis of the left ventricular, entire inferior  wall.   2. Right ventricular systolic function is moderately reduced. The right  ventricular size is moderately enlarged. There is normal pulmonary artery  systolic pressure.   3. Left atrial size was severely dilated.   4. Right atrial size was severely dilated.   5. The mitral valve is normal in structure. Mild to moderate mitral valve  regurgitation. No evidence of mitral stenosis.   6. The aortic valve has been repaired/replaced. Aortic valve  regurgitation is trivial. No aortic stenosis is present. There is a 29 mm  Edwards bioprosthetic valve present in the aortic position. Procedure  Date: 05/23/2020. Echo findings are consistent  with normal structure and function of the aortic valve prosthesis.   7. Aortic dilatation noted. There is mild dilatation of the aortic root,  measuring 42 mm. There is mild dilatation of the ascending aorta,  measuring 43 mm.   8. The inferior vena cava is normal in size with greater than 50%  respiratory variability, suggesting right atrial pressure of 3 mmHg.   ASSESSMENT AND  PLAN:  1. Chronic combined systolic/diastolic CHF. EF 30-35%.  Patient is s/p AVR for severe AS and CABG for severe multivessel CAD. Prolonged hospital course due to persistent CHF with low output.  - clinically is doing OK. He has not been in the hospital since his surgery. Other than persistent fatigue he is really holding his own and fairly functional although not at the level he would like.  - now on lasix daily - intolerant of Entresto due to low BP. On very low dose losartan  -daily weight - sodium and fluid restriction. - not a candidate for dig due to AV block.  - poor candidate for SGLT-2 inhibitor due to urinary retention.    2. Severe low flow AS.  - s/p AVR. Follow up Echo showed good valve function.    3. CAD severe s/p CABG. No active angina.   4. Recurrent Afib. Rate is controlled. On Eliquis. Had been on amiodarone before. Discussed the possibility of resuming amiodarone and attempting DCCV versus continued rate control. I am not sure he would benefit symptomatically with restoration of NSR. He would need to be on AAD therapy with risk of medication with side effects and possible rate slowing.  Will discuss with heart failure team.  5. Severe deconditioning improving.  6.  Hyperlipidemia - statin therapy held post op due to severe deconditioning. At his age not sure of benefit in resuming.   7. CKD. Stage 3a   Follow up in 6 months.  Signed, Andie Mortimer Martinique, MD  07/14/2021 2:16 PM    [place] [address] [place], [place], [address] Phone [phone], Fax [phone]

## 2021-07-14 ENCOUNTER — Ambulatory Visit (INDEPENDENT_AMBULATORY_CARE_PROVIDER_SITE_OTHER): Payer: Medicare HMO | Admitting: Cardiology

## 2021-07-14 ENCOUNTER — Encounter: Payer: Self-pay | Admitting: Cardiology

## 2021-07-14 ENCOUNTER — Other Ambulatory Visit: Payer: Self-pay

## 2021-07-14 VITALS — BP 112/62 | HR 65 | Ht 69.25 in | Wt 179.5 lb

## 2021-07-14 DIAGNOSIS — Z952 Presence of prosthetic heart valve: Secondary | ICD-10-CM | POA: Diagnosis not present

## 2021-07-14 DIAGNOSIS — I480 Paroxysmal atrial fibrillation: Secondary | ICD-10-CM

## 2021-07-14 DIAGNOSIS — Z951 Presence of aortocoronary bypass graft: Secondary | ICD-10-CM

## 2021-07-14 DIAGNOSIS — I5042 Chronic combined systolic (congestive) and diastolic (congestive) heart failure: Secondary | ICD-10-CM | POA: Diagnosis not present

## 2021-07-14 DIAGNOSIS — R5383 Other fatigue: Secondary | ICD-10-CM | POA: Diagnosis not present

## 2021-07-14 DIAGNOSIS — N1831 Chronic kidney disease, stage 3a: Secondary | ICD-10-CM

## 2021-07-21 DIAGNOSIS — C44311 Basal cell carcinoma of skin of nose: Secondary | ICD-10-CM | POA: Diagnosis not present

## 2021-07-21 DIAGNOSIS — C4491 Basal cell carcinoma of skin, unspecified: Secondary | ICD-10-CM | POA: Diagnosis not present

## 2021-07-21 DIAGNOSIS — Z481 Encounter for planned postprocedural wound closure: Secondary | ICD-10-CM | POA: Diagnosis not present

## 2021-07-28 ENCOUNTER — Other Ambulatory Visit: Payer: Self-pay | Admitting: *Deleted

## 2021-07-28 NOTE — Patient Outreach (Signed)
[facility]) Care Management  07/28/2021  [name] [name] Jun 16, 1928 [id number]   Outreach attempt #4 to member/wife, unsuccessful, HIPAA compliant voice message left.  No response from member after multiple unsuccessful outreach attempts and letter sent.  Will close case at this time due to inability to maintain contact.  Will notify member and primary MD of case closure.  [name], RN, MSN, [place] Manager [phone]

## 2021-07-29 DIAGNOSIS — R339 Retention of urine, unspecified: Secondary | ICD-10-CM | POA: Diagnosis not present

## 2021-08-24 DIAGNOSIS — E440 Moderate protein-calorie malnutrition: Secondary | ICD-10-CM | POA: Diagnosis not present

## 2021-08-24 DIAGNOSIS — K5900 Constipation, unspecified: Secondary | ICD-10-CM | POA: Diagnosis not present

## 2021-08-24 DIAGNOSIS — R69 Illness, unspecified: Secondary | ICD-10-CM | POA: Diagnosis not present

## 2021-08-24 DIAGNOSIS — E782 Mixed hyperlipidemia: Secondary | ICD-10-CM | POA: Diagnosis not present

## 2021-08-24 DIAGNOSIS — I5042 Chronic combined systolic (congestive) and diastolic (congestive) heart failure: Secondary | ICD-10-CM | POA: Diagnosis not present

## 2021-08-24 DIAGNOSIS — N312 Flaccid neuropathic bladder, not elsewhere classified: Secondary | ICD-10-CM | POA: Diagnosis not present

## 2021-08-24 DIAGNOSIS — K5190 Ulcerative colitis, unspecified, without complications: Secondary | ICD-10-CM | POA: Diagnosis not present

## 2021-08-24 DIAGNOSIS — Z79899 Other long term (current) drug therapy: Secondary | ICD-10-CM | POA: Diagnosis not present

## 2021-08-24 DIAGNOSIS — I480 Paroxysmal atrial fibrillation: Secondary | ICD-10-CM | POA: Diagnosis not present

## 2021-08-24 DIAGNOSIS — R5383 Other fatigue: Secondary | ICD-10-CM | POA: Diagnosis not present

## 2021-08-28 DIAGNOSIS — Z8719 Personal history of other diseases of the digestive system: Secondary | ICD-10-CM | POA: Diagnosis not present

## 2021-08-29 ENCOUNTER — Other Ambulatory Visit: Payer: Self-pay | Admitting: Cardiology

## 2021-08-31 NOTE — Telephone Encounter (Signed)
Prescription refill request for Eliquis received. ?Indication:Afib ?Last office visit:1/23 ?Scr:1.2 ?Age: 88 ?Weight:81.4 kg ? ?Prescription refilled ? ?

## 2021-09-01 DIAGNOSIS — D696 Thrombocytopenia, unspecified: Secondary | ICD-10-CM | POA: Diagnosis not present

## 2021-09-01 DIAGNOSIS — Z79899 Other long term (current) drug therapy: Secondary | ICD-10-CM | POA: Diagnosis not present

## 2021-09-09 DIAGNOSIS — R339 Retention of urine, unspecified: Secondary | ICD-10-CM | POA: Diagnosis not present

## 2021-09-21 DIAGNOSIS — L570 Actinic keratosis: Secondary | ICD-10-CM | POA: Diagnosis not present

## 2021-09-21 DIAGNOSIS — D225 Melanocytic nevi of trunk: Secondary | ICD-10-CM | POA: Diagnosis not present

## 2021-09-21 DIAGNOSIS — L578 Other skin changes due to chronic exposure to nonionizing radiation: Secondary | ICD-10-CM | POA: Diagnosis not present

## 2021-09-21 DIAGNOSIS — Z85828 Personal history of other malignant neoplasm of skin: Secondary | ICD-10-CM | POA: Diagnosis not present

## 2021-09-21 DIAGNOSIS — L821 Other seborrheic keratosis: Secondary | ICD-10-CM | POA: Diagnosis not present

## 2021-09-21 DIAGNOSIS — L814 Other melanin hyperpigmentation: Secondary | ICD-10-CM | POA: Diagnosis not present

## 2021-09-21 DIAGNOSIS — Z23 Encounter for immunization: Secondary | ICD-10-CM | POA: Diagnosis not present

## 2021-10-23 DIAGNOSIS — N312 Flaccid neuropathic bladder, not elsewhere classified: Secondary | ICD-10-CM | POA: Diagnosis not present

## 2021-10-23 DIAGNOSIS — R338 Other retention of urine: Secondary | ICD-10-CM | POA: Diagnosis not present

## 2021-10-23 DIAGNOSIS — C61 Malignant neoplasm of prostate: Secondary | ICD-10-CM | POA: Diagnosis not present

## 2021-11-20 DIAGNOSIS — I5042 Chronic combined systolic (congestive) and diastolic (congestive) heart failure: Secondary | ICD-10-CM | POA: Diagnosis not present

## 2021-11-20 DIAGNOSIS — R5383 Other fatigue: Secondary | ICD-10-CM | POA: Diagnosis not present

## 2021-11-20 DIAGNOSIS — I480 Paroxysmal atrial fibrillation: Secondary | ICD-10-CM | POA: Diagnosis not present

## 2021-11-26 DIAGNOSIS — R339 Retention of urine, unspecified: Secondary | ICD-10-CM | POA: Diagnosis not present

## 2021-12-01 DIAGNOSIS — L570 Actinic keratosis: Secondary | ICD-10-CM | POA: Diagnosis not present

## 2021-12-01 DIAGNOSIS — D0462 Carcinoma in situ of skin of left upper limb, including shoulder: Secondary | ICD-10-CM | POA: Diagnosis not present

## 2021-12-01 DIAGNOSIS — D485 Neoplasm of uncertain behavior of skin: Secondary | ICD-10-CM | POA: Diagnosis not present

## 2021-12-01 DIAGNOSIS — D0471 Carcinoma in situ of skin of right lower limb, including hip: Secondary | ICD-10-CM | POA: Diagnosis not present

## 2022-01-11 DIAGNOSIS — R339 Retention of urine, unspecified: Secondary | ICD-10-CM | POA: Diagnosis not present

## 2022-01-22 NOTE — Progress Notes (Unsigned)
Cardiology Office Note   Date:  01/27/2022   ID:  [name] [name], DOB 30-May-1928, MRN [id number]  PCP:  Cari Caraway, MD  Cardiologist:   Ira Dougher [place], MD   Chief Complaint  Patient presents with   Congestive Heart Failure       History of Present Illness: [name] [name] is a 88 y.o. male who is seen for follow up CHF, AS. He has a history of venous insufficiency with chronic lower extremity edema,hypertension, hyperlipidemia, peripheral neuropathy, essential tremor, and prostate cancer.   Patient first seen  in 09/2012 for evaluation of dyspnea on exertion and lower extremity edema, especially in his left leg. Venous dopplers were ordered and showed no DVT. Echo showed LVEF of 55-60% with moderate asymmetric basal septal hypertrophy with no LV outflow tract gradient and mild to moderate MR. He was seen  in 07/2017 at which time he was doing well. He denied any chest pain or shortness of breath. He continued to report some lower extremity edema that resolved with elevation of legs at night. Edema felt to be due to venous insufficiency and conservative management with sodium restriction, elevation of legs, and compression stockings was recommended.    He was diagnosed with right middle lobe pneumonia on 10/12/2019 after presenting for further evaluation of fatigue while on vacation at the beach.. He tested negative for COVID. He was started on Levaquin but this had to be stopped when he developed tendonitis. He was started on Azithromycin instead on 10/26/2019. Repeat chest x-ray on 10/26/2019 showed increased density of right lower lobe pneumonia. He had persistent fatigue  and developed significant lower extremity edema and weight gain. Patient was seen by PCP again on 11/14/2019. Repeat chest x-ray showed continued right basilar pneumonia and increased right pleural effusion. BNP was checked and came back elevated at 1,380. He was started on Lasix 40m daily. Chest CT was also ordered to rule out  underlying lung mass. CT was performed on 11/15/2019 and showed mild cardiomegaly, moderate right pleural effusion with adjacent atelectasis or pneumonia in the right lower and middle lobes as well as coronary artery calcifications. Of note, prior to being started on Lasix, he was seen by Urology for urinary retention and had a catheter placed.  This was later removed and he performed in and out cath.   He had an Echo done in June 2021 showing EF 40-45% with severe HK of the entire inferior wall. There was severe basal septal hypertrophy with gr 2 diastolic dysfunction. Moderate MR, mild AS and mild Aortic root enlargement. We started him on losartan 25 mg daily but dose had to be reduced to 12.5 mg due to hypotension. He was seen in September with worsening CHF with weight gain, edema and worsening fatigue. Lasix dose was increased. Echo showed further decline in LV function with EF 30-35%. AS was felt to be moderate to severe ( low flow AS).   He subsequently underwent cardiac cath. This demonstrated severe 3 vessel CAD with complex anatomy as well as low flow severe AS.  He was seen in consultation with Dr BCyndia Bentand underwent combined AVR with a #29 Edwards Inspiris Resilia valve and CABG with LIMA to the LAD, sequential SVG to OM1 and OM2, and SVG to the PDA on 05/23/20.  His post op course was complicated by persistent low output CHF and hypotension. No beta blocker due to hypotension. No digoxin due to some AV block. Was followed inpatient by the Heart failure service. Was on  Dobutamine until Dec 18 then weaned off. He had some post op Afib but converted to NSR. He was placed on midodrine for hypotension. He had significant debility and was evaluated by OT an PT. He was treated for PNA. Repeat Echo showed EF 35% with good AVR prosthesis function. He was DC to SNF on 06/26/20.   Followed in Advanced heart failure clinic. Was felt to be in Afib and DCCV arranged but when he presented to the hospital he was  in NSR.   When last seen we started him on Entresto. He was seen in the Advanced heart failure clinic on 5/11 and was experiencing orthostasis and was switched back to losartan. Amiodarone discontinued. Was on lasix about every other day. Echo was repeated with EF 35-40%. Stable AV prosthesis. Mild to mod MR. Moderate RV dysfunction.   Was seen in Advanced heart failure clinic in Dec. Noted symptoms of fatigue. Losartan dose reduced due to low BP. Home sleep study recommended but they never followed through.   He is seen with his wife today. His major complaint is of lack of energy.  He is taking lasix twice a day now with increased right leg edema. . No chest pain, palpitations, orthopnea. He is able to walk around his kitchen and bedroom 35 times. He is riding recumbent bike at the gym every other day for 40 minutes. He does complain of constipation. His weight is actually down 8 lbs. States he gets full easily.   Past Medical History:  Diagnosis Date   Aortic stenosis    Arthritis    CAD (coronary artery disease)    CHF (congestive heart failure) (HCC)    Colitis    Edema    Essential hypertension    no med now   Essential tremor    Hyperlipidemia    Hypotonic bladder    L4-L5   Obstructive uropathy    Peripheral neuropathy    Elevated by Dr. love 2010   Prostate cancer [facility]) 2005    Past Surgical History:  Procedure Laterality Date   ACHILLES TENDON SURGERY Bilateral 1995   AORTIC VALVE REPLACEMENT N/A 05/23/2020   Procedure: AORTIC VALVE REPLACEMENT (AVR) USING EDWARDS RESILIA 29 MM AORTIC VALVE.;  Surgeon: Gaye Pollack, MD;  Location: [facility];  Service: Open Heart Surgery;  Laterality: N/A;   CARDIAC CATHETERIZATION     COLONOSCOPY     With polyp resection   CORONARY ARTERY BYPASS GRAFT N/A 05/23/2020   Procedure: CORONARY ARTERY BYPASS GRAFTING (CABG) USING LIMA to LAD; ENDOSCOPIC HARVESTED RIGHT GREATER SAPHENOUS VEIN: SVG to PD; SVG to SEQUENCED INTERMEDIATE to OM;   Surgeon: Gaye Pollack, MD;  Location: [place];  Service: Open Heart Surgery;  Laterality: N/A;   ENDOVEIN HARVEST OF GREATER SAPHENOUS VEIN Right 05/23/2020   Procedure: ENDOVEIN HARVEST OF GREATER SAPHENOUS VEIN;  Surgeon: Gaye Pollack, MD;  Location: [place];  Service: Open Heart Surgery;  Laterality: Right;   REVERSE SHOULDER ARTHROPLASTY Right 06/04/2016   REVERSE SHOULDER ARTHROPLASTY Right 06/04/2016   Procedure: REVERSE SHOULDER ARTHROPLASTY;  Surgeon: Netta Cedars, MD;  Location: [place];  Service: Orthopedics;  Laterality: Right;   RIGHT/LEFT HEART CATH AND CORONARY ANGIOGRAPHY N/A 05/20/2020   Procedure: RIGHT/LEFT HEART CATH AND CORONARY ANGIOGRAPHY;  Surgeon: Martin[place], Shir Bergman M, MD;  Location: [place] [facility];  Service: Cardiovascular;  Laterality: N/A;   SKIN CANCER EXCISION  1974   On chest wall   TEE WITHOUT CARDIOVERSION N/A 05/23/2020   Procedure:  TRANSESOPHAGEAL ECHOCARDIOGRAM (TEE);  Surgeon: Gaye Pollack, MD;  Location: [place];  Service: Open Heart Surgery;  Laterality: N/A;   TOTAL KNEE ARTHROPLASTY Right 2011   TOTAL KNEE ARTHROPLASTY Left 04/09/2013   Procedure: LEFT TOTAL KNEE ARTHROPLASTY;  Surgeon: Gearlean Alf, MD;  Location: [facility];  Service: Orthopedics;  Laterality: Left;     Current Outpatient Medications  Medication Sig Dispense Refill   apixaban (ELIQUIS) 5 MG TABS tablet Take 1 tablet by mouth twice daily 180 tablet 1   Ascorbic Acid (VITAMIN C) 100 MG tablet Take 100 mg by mouth daily.     aspirin 81 MG tablet Take 81 mg by mouth at bedtime.      Cholecalciferol (VITAMIN D) 50 MCG (2000 UT) tablet Take 1 tablet by mouth daily.     docusate sodium (COLACE) 100 MG capsule Take 100 mg by mouth daily as needed for mild constipation.     dutasteride (AVODART) 0.5 MG capsule Take 0.5 mg by mouth daily.     feeding supplement, ENSURE COMPLETE, (ENSURE COMPLETE) LIQD Take 237 mLs by mouth 2 (two) times daily between meals.     ferrous [id number]  C-folic acid (TRINSICON / FOLTRIN) capsule Take 1 capsule by mouth 2 (two) times daily after a meal.     furosemide (LASIX) 40 MG tablet Take 1 tablet by mouth twice daily 60 tablet 3   melatonin 5 MG TABS Take 1 tablet (5 mg total) by mouth at bedtime as needed (sleep).  0   Multiple Vitamins-Minerals (CENTRUM SILVER PO) Take 1 tablet by mouth daily.      sulfaSALAzine (AZULFIDINE) 500 MG tablet Take 2,000 mg by mouth daily.     tamsulosin (FLOMAX) 0.4 MG CAPS Take 0.4 mg by mouth 2 (two) times daily.      vitamin B-12 (CYANOCOBALAMIN) 100 MCG tablet Take 1 tablet by mouth daily.     losartan (COZAAR) 25 MG tablet Take 0.5 tablets (12.5 mg total) by mouth at bedtime. 15 tablet 10   No current facility-administered medications for this visit.    Allergies:   Simvastatin    Social History:  The patient  reports that he has never smoked. He has never used smokeless tobacco. He reports current alcohol use of about 1.0 standard drink of alcohol per week. He reports that he does not use drugs.   Family History:  The patient's family history includes Diabetes in his mother; Heart attack in his brother and maternal aunt; Heart failure in his mother; Hypertension in his mother.    ROS:  Please see the history of present illness.   Otherwise, review of systems are positive for none.   All other systems are reviewed and negative.    PHYSICAL EXAM: VS:  BP 100/60 (BP Location: Left Arm, Patient Position: Sitting, Cuff Size: Normal)   Pulse 64   Ht 5' 9"  (1.753 m)   Wt 171 lb 9.6 oz (77.8 kg)   SpO2 95%   BMI 25.34 kg/m  , BMI Body mass index is 25.34 kg/m. GEN: elderly WM, well developed, in no acute distress  HEENT: normal  Neck: + JVD to 6 cm, no carotid bruits, or masses Cardiac: RRR; gr 1/6 systolic murmur at the RUSB. No  rubs, or gallops.  Respiratory:  left basilar rales.  GI  soft, nontender, nondistended, + BS MS: no deformity or atrophy  Skin: warm and dry, no rash Ext: 2+  right pretibial edema. Trace on left.  Neuro:  Strength and sensation are intact Psych: euthymic mood, full affect   EKG:  EKG is not ordered today.  Recent Labs: 05/28/2021: ALT 15; B Natriuretic Peptide 917.1; BUN 22; Creatinine, Ser 1.21; Hemoglobin 13.1; Platelets 155; Potassium 4.2; Sodium 135; TSH 1.477    Lipid Panel No results found for: "CHOL", "TRIG", "HDL", "CHOLHDL", "VLDL", "LDLCALC", "LDLDIRECT"    Wt Readings from Last 3 Encounters:  01/27/22 171 lb 9.6 oz (77.8 kg)  07/14/21 179 lb 8 oz (81.4 kg)  05/28/21 181 lb 3.2 oz (82.2 kg)    - 10/12/2019: WBC 7.6, Hgb 14.2, Plts 246. Na 140, K 4.4, Glucose 103, BUN 12, Cr 1.26. AST 29, ALT 21, Alk Phos 61, Total Bili 0.5. TSH 0.849. UA showed 74m of protein but was otherwise unremarkable.  - 10/26/2019: Cr 1.20.  - 11/14/2019: BNP 1,380. Na 140, K 4.6, Glucose 107, BUN 22, Cr 1.16. AST 26, ALT, Alk Phos 59.  Dated 12/06/19: BUN 27, creatinine 1.4. CBC and CMET normal.  Labs dated1/20/22: BUN 18, creatinine 1.2. Lytes OK. Hgb 11.1. normal WBC. BNP 2010.  Dated 08/24/21: BUN 27. Creatinine 1.2. otherwise CMET normal, TSH normal.  Dated 11/20/21: normal CBC.   CXR report 07/15/20: moderate bilateral pleural effusions.   Other studies Reviewed: Additional studies/ records that were reviewed today include:  Echo 12/10/19: IMPRESSIONS     1. Left ventricular ejection fraction, by estimation, is 40 to 45%. The  left ventricle has mildly decreased function. The left ventricle  demonstrates global hypokinesis. There is severe left ventricular  hypertrophy of the basal-septal segment. Left  ventricular diastolic parameters are consistent with Grade II diastolic  dysfunction (pseudonormalization). There is severe hypokinesis of the left  ventricular, entire inferior wall and inferoseptal wall.   2. Right ventricular systolic function is normal. The right ventricular  size is mildly enlarged. There is normal pulmonary artery systolic   pressure. The estimated right ventricular systolic pressure is 335.5mmHg.   3. Left atrial size was mild to moderately dilated.   4. The mitral valve is normal in structure. Moderate mitral valve  regurgitation. No evidence of mitral stenosis.   5. The aortic valve is tricuspid. Aortic valve regurgitation is not  visualized. Mild aortic valve stenosis. Aortic valve area, by VTI measures  1.51 cm. Aortic valve mean gradient measures 20.0 mmHg. Aortic valve Vmax  measures 2.46 m/s. The degree of AS   may be underestimated in the setting of LV dysfunction. rree-=--   6. Aortic dilatation noted. There is mild dilatation of the aortic root  and of the ascending aorta measuring 42 mm and 467mrespectively.   7. The inferior vena cava is dilated in size with >50% respiratory  variability, suggesting right atrial pressure of 8 mmHg.    Echo 04/01/20: IMPRESSIONS     1. LVEF has decreased from 12/10/2019, now 30-35% with LV dyssynchrony.  Aortic stenosis is most probably severe with low flow low gradient,  dimensionless index 0.26.   2. Left ventricular ejection fraction, by estimation, is 30 to 35%. The  left ventricle has moderately decreased function. The left ventricle  demonstrates global hypokinesis. The left ventricular internal cavity size  was mildly dilated. There is severe  asymmetric left ventricular hypertrophy of the basal-septal segment. Left  ventricular diastolic function could not be evaluated.   3. Right ventricular systolic function is mildly reduced. The right  ventricular size is mildly enlarged. There is moderately elevated  pulmonary artery systolic pressure. The estimated right ventricular  systolic pressure is 50.2 mmHg.   4. Left atrial size was severely dilated.   5. Right atrial size was moderately dilated.   6. The mitral valve is normal in structure. Moderate mitral valve  regurgitation. No evidence of mitral stenosis.   7. The aortic valve is normal in  structure. Aortic valve regurgitation is  not visualized. Moderate to severe aortic valve stenosis. Aortic valve  mean gradient measures 24.0 mmHg.   8. Aortic dilatation noted. There is mild to moderate dilatation of the  ascending aorta, measuring 44 mm.   9. The inferior vena cava is dilated in size with <50% respiratory  variability, suggesting right atrial pressure of 15 mmHg.   Echo 11/05/20: IMPRESSIONS     1. Left ventricular ejection fraction, by estimation, is 35 to 40%. The  left ventricle has moderately decreased function. The left ventricle  demonstrates regional wall motion abnormalities (see scoring  diagram/findings for description). There is  moderate left ventricular hypertrophy. Left ventricular diastolic  parameters are consistent with Grade I diastolic dysfunction (impaired  relaxation). There is akinesis of the left ventricular, entire inferior  wall.   2. Right ventricular systolic function is moderately reduced. The right  ventricular size is moderately enlarged. There is normal pulmonary artery  systolic pressure.   3. Left atrial size was severely dilated.   4. Right atrial size was severely dilated.   5. The mitral valve is normal in structure. Mild to moderate mitral valve  regurgitation. No evidence of mitral stenosis.   6. The aortic valve has been repaired/replaced. Aortic valve  regurgitation is trivial. No aortic stenosis is present. There is a 29 mm  Edwards bioprosthetic valve present in the aortic position. Procedure  Date: 05/23/2020. Echo findings are consistent  with normal structure and function of the aortic valve prosthesis.   7. Aortic dilatation noted. There is mild dilatation of the aortic root,  measuring 42 mm. There is mild dilatation of the ascending aorta,  measuring 43 mm.   8. The inferior vena cava is normal in size with greater than 50%  respiratory variability, suggesting right atrial pressure of 3 mmHg.   ASSESSMENT AND  PLAN:  1. Chronic combined systolic/diastolic CHF. EF 30-35%.  Patient is s/p AVR for severe AS and CABG for severe multivessel CAD. Prolonged hospital course due to persistent CHF with low output.  - clinically is doing better. I am actually impressed with the improvement in his activity level.   - he does have some rales and LE edema R>L so I agree with twice daily lasix now.  - intolerant of Entresto due to low BP. On very low dose losartan  -daily weight - sodium and fluid restriction. - not a candidate for dig due to AV block.  - poor candidate for SGLT-2 inhibitor due to urinary retention. - scheduled for follow up with Dr Haroldine Laws in September with repeat Echo    2. Severe low flow AS.  - s/p AVR. Follow up Echo showed good valve function.    3. CAD severe s/p CABG. No active angina.   4. Recurrent Afib. Rate is controlled. On Eliquis. Rate controlled.   5. Severe deconditioning improving.   6.  Hyperlipidemia - statin therapy held post op due to severe deconditioning. At his age not sure of benefit in resuming.   7. CKD. Stage 3a   Follow up in 6 months.  Signed, Mistie Adney [place], MD  01/27/2022 2:37 PM    [facility]  [address], [place], [place], [address] Phone [phone], Fax [phone]

## 2022-01-27 ENCOUNTER — Encounter: Payer: Self-pay | Admitting: Cardiology

## 2022-01-27 ENCOUNTER — Ambulatory Visit: Payer: Medicare HMO | Admitting: Cardiology

## 2022-01-27 VITALS — BP 100/60 | HR 64 | Ht 69.0 in | Wt 171.6 lb

## 2022-01-27 DIAGNOSIS — I2510 Atherosclerotic heart disease of native coronary artery without angina pectoris: Secondary | ICD-10-CM

## 2022-01-27 DIAGNOSIS — I5042 Chronic combined systolic (congestive) and diastolic (congestive) heart failure: Secondary | ICD-10-CM | POA: Diagnosis not present

## 2022-01-27 DIAGNOSIS — Z951 Presence of aortocoronary bypass graft: Secondary | ICD-10-CM | POA: Diagnosis not present

## 2022-01-27 DIAGNOSIS — Z952 Presence of prosthetic heart valve: Secondary | ICD-10-CM

## 2022-01-27 DIAGNOSIS — N1831 Chronic kidney disease, stage 3a: Secondary | ICD-10-CM | POA: Diagnosis not present

## 2022-01-27 MED ORDER — LOSARTAN POTASSIUM 25 MG PO TABS: 12.5000 mg | ORAL_TABLET | Freq: Every day | ORAL | 10 refills | Status: DC

## 2022-02-15 DIAGNOSIS — Z79899 Other long term (current) drug therapy: Secondary | ICD-10-CM | POA: Diagnosis not present

## 2022-02-15 DIAGNOSIS — D696 Thrombocytopenia, unspecified: Secondary | ICD-10-CM | POA: Diagnosis not present

## 2022-02-16 DIAGNOSIS — R339 Retention of urine, unspecified: Secondary | ICD-10-CM | POA: Diagnosis not present

## 2022-02-22 ENCOUNTER — Other Ambulatory Visit: Payer: Self-pay | Admitting: Cardiology

## 2022-02-22 DIAGNOSIS — I5042 Chronic combined systolic (congestive) and diastolic (congestive) heart failure: Secondary | ICD-10-CM | POA: Diagnosis not present

## 2022-02-22 DIAGNOSIS — Z23 Encounter for immunization: Secondary | ICD-10-CM | POA: Diagnosis not present

## 2022-02-22 DIAGNOSIS — K5190 Ulcerative colitis, unspecified, without complications: Secondary | ICD-10-CM | POA: Diagnosis not present

## 2022-02-22 DIAGNOSIS — Z6826 Body mass index (BMI) 26.0-26.9, adult: Secondary | ICD-10-CM | POA: Diagnosis not present

## 2022-02-22 DIAGNOSIS — N312 Flaccid neuropathic bladder, not elsewhere classified: Secondary | ICD-10-CM | POA: Diagnosis not present

## 2022-02-22 DIAGNOSIS — K5900 Constipation, unspecified: Secondary | ICD-10-CM | POA: Diagnosis not present

## 2022-02-22 DIAGNOSIS — I480 Paroxysmal atrial fibrillation: Secondary | ICD-10-CM | POA: Diagnosis not present

## 2022-02-22 DIAGNOSIS — E440 Moderate protein-calorie malnutrition: Secondary | ICD-10-CM | POA: Diagnosis not present

## 2022-03-11 ENCOUNTER — Other Ambulatory Visit: Payer: Self-pay | Admitting: Cardiology

## 2022-03-11 DIAGNOSIS — I480 Paroxysmal atrial fibrillation: Secondary | ICD-10-CM

## 2022-03-12 NOTE — Telephone Encounter (Signed)
Prescription refill request for Eliquis received. Indication: Afib  Last office visit: 01/27/22 (Martin[place])  Scr: 1.21 (05/28/21)  Age: 88 Weight: 77.8kg  Appropriate dose and refill sent to requested pharmacy.

## 2022-03-18 ENCOUNTER — Ambulatory Visit (HOSPITAL_BASED_OUTPATIENT_CLINIC_OR_DEPARTMENT_OTHER)
Admission: RE | Admit: 2022-03-18 | Discharge: 2022-03-18 | Disposition: A | Discharge status: Home / Self Care | Payer: Medicare HMO | Source: Ambulatory Visit | Attending: Internal Medicine | Admitting: Internal Medicine

## 2022-03-18 ENCOUNTER — Other Ambulatory Visit: Payer: Self-pay

## 2022-03-18 ENCOUNTER — Ambulatory Visit (HOSPITAL_COMMUNITY)
Admission: RE | Admit: 2022-03-18 | Discharge: 2022-03-18 | Disposition: A | Discharge status: Home / Self Care | Payer: Medicare HMO | Source: Ambulatory Visit | Attending: Internal Medicine | Admitting: Internal Medicine

## 2022-03-18 ENCOUNTER — Encounter (HOSPITAL_COMMUNITY): Payer: Self-pay | Admitting: Internal Medicine

## 2022-03-18 VITALS — BP 126/82 | HR 72 | Wt 169.2 lb

## 2022-03-18 DIAGNOSIS — Z951 Presence of aortocoronary bypass graft: Secondary | ICD-10-CM | POA: Diagnosis not present

## 2022-03-18 DIAGNOSIS — I5082 Biventricular heart failure: Secondary | ICD-10-CM | POA: Diagnosis not present

## 2022-03-18 DIAGNOSIS — Z7982 Long term (current) use of aspirin: Secondary | ICD-10-CM | POA: Diagnosis not present

## 2022-03-18 DIAGNOSIS — R0683 Snoring: Secondary | ICD-10-CM | POA: Insufficient documentation

## 2022-03-18 DIAGNOSIS — I2510 Atherosclerotic heart disease of native coronary artery without angina pectoris: Secondary | ICD-10-CM | POA: Diagnosis not present

## 2022-03-18 DIAGNOSIS — I5042 Chronic combined systolic (congestive) and diastolic (congestive) heart failure: Secondary | ICD-10-CM

## 2022-03-18 DIAGNOSIS — I130 Hypertensive heart and chronic kidney disease with heart failure and stage 1 through stage 4 chronic kidney disease, or unspecified chronic kidney disease: Secondary | ICD-10-CM | POA: Diagnosis not present

## 2022-03-18 DIAGNOSIS — R339 Retention of urine, unspecified: Secondary | ICD-10-CM | POA: Diagnosis not present

## 2022-03-18 DIAGNOSIS — Z952 Presence of prosthetic heart valve: Secondary | ICD-10-CM | POA: Diagnosis not present

## 2022-03-18 DIAGNOSIS — I4891 Unspecified atrial fibrillation: Secondary | ICD-10-CM | POA: Insufficient documentation

## 2022-03-18 DIAGNOSIS — R5383 Other fatigue: Secondary | ICD-10-CM | POA: Diagnosis not present

## 2022-03-18 DIAGNOSIS — N1831 Chronic kidney disease, stage 3a: Secondary | ICD-10-CM | POA: Insufficient documentation

## 2022-03-18 DIAGNOSIS — Z79899 Other long term (current) drug therapy: Secondary | ICD-10-CM | POA: Diagnosis not present

## 2022-03-18 DIAGNOSIS — Z7901 Long term (current) use of anticoagulants: Secondary | ICD-10-CM | POA: Diagnosis not present

## 2022-03-18 LAB — BASIC METABOLIC PANEL
Anion gap: 10 (ref 5–15)
BUN: 14 mg/dL (ref 8–23)
CO2: 27 mmol/L (ref 22–32)
Calcium: 9.4 mg/dL (ref 8.9–10.3)
Chloride: 100 mmol/L (ref 98–111)
Creatinine, Ser: 1.2 mg/dL (ref 0.61–1.24)
GFR, Estimated: 57 mL/min — ABNORMAL LOW (ref >60)
Glucose, Bld: 98 mg/dL (ref 70–99)
Potassium: 4 mmol/L (ref 3.5–5.1)
Sodium: 137 mmol/L (ref 135–145)

## 2022-03-18 LAB — CBC
HCT: 37.3 % — ABNORMAL LOW (ref 39.0–52.0)
Hemoglobin: 12.4 g/dL — ABNORMAL LOW (ref 13.0–17.0)
MCH: 32.7 pg (ref 26.0–34.0)
MCHC: 33.2 g/dL (ref 30.0–36.0)
MCV: 98.4 fL (ref 80.0–100.0)
Platelets: 210 10*3/uL (ref 150–400)
RBC: 3.79 MIL/uL — ABNORMAL LOW (ref 4.22–5.81)
RDW: 13.4 % (ref 11.5–15.5)
WBC: 7.3 10*3/uL (ref 4.0–10.5)
nRBC: 0 % (ref 0.0–0.2)

## 2022-03-18 LAB — ECHOCARDIOGRAM COMPLETE
AR max vel: 4.28 cm2
AV Area VTI: 4.62 cm2
AV Area mean vel: 4.2 cm2
AV Mean grad: 5 mmHg
AV Peak grad: 8 mmHg
Ao pk vel: 1.42 m/s
Area-P 1/2: 4.09 cm2
MV M vel: 4.62 m/s
MV Peak grad: 85.4 mmHg
Radius: 0.43 cm
S' Lateral: 3.8 cm

## 2022-03-18 LAB — BRAIN NATRIURETIC PEPTIDE: B Natriuretic Peptide: 779.6 pg/mL — ABNORMAL HIGH (ref 0.0–100.0)

## 2022-03-18 NOTE — Progress Notes (Signed)
Height:  5'9"    Weight: 169 lb BMI: 24.99  Today's Date: 03/18/22  STOP BANG RISK ASSESSMENT S (snore) Have you been told that you snore?     YES   T (tired) Are you often tired, fatigued, or sleepy during the day?   YES  O (obstruction) Do you stop breathing, choke, or gasp during sleep? NO   P (pressure) Do you have or are you being treated for high blood pressure? YES   B (BMI) Is your body index greater than 35 kg/m? NO   A (age) Are you 88 years old or older? YES   N (neck) Do you have a neck circumference greater than 16 inches?      G (gender) Are you a male? YES   TOTAL STOP/BANG "YES" ANSWERS 5                                                                       For Office Use Only              Procedure Order Form    YES to 3+ Stop Bang questions OR two clinical symptoms - patient qualifies for WatchPAT (CPT 95800)      Clinical Notes: Will consult Sleep Specialist and refer for management of therapy due to patient increased risk of Sleep Apnea. Ordering a sleep study due to the following two clinical symptoms: Excessive daytime sleepiness G47.10 /  Loud snoring R06.83

## 2022-03-18 NOTE — Patient Instructions (Addendum)
Good to see you today!  Labs done today, your results will be available in MyChart, we will contact you for abnormal readings.  Your provider has recommended that you have a home sleep study.  We have provided you with the equipment in our office today. Please download the app and follow the instructions. YOUR PIN NUMBER IS: [id number]. Once you have completed the test you just dispose of the equipment, the information is automatically uploaded to [place] via blue-tooth technology. If your test is positive for sleep apnea and you need a home CPAP machine you will be contacted by Dr Theodosia Blender office [facility]) to set this up.  Your physician recommends that you schedule a follow-up appointment in: 6 months (March 2024), **PLEASE CALL OUR OFFICE IN [place] TO SCHEDULE THIS APPOINTMENT   If you have any questions or concerns before your next appointment please send [place] a message through [place] or call our office at [phone].    TO LEAVE A MESSAGE FOR THE NURSE SELECT OPTION 2, PLEASE LEAVE A MESSAGE INCLUDING: YOUR NAME DATE OF BIRTH CALL BACK NUMBER REASON FOR CALL**this is important as we prioritize the call backs  YOU WILL RECEIVE A CALL BACK THE SAME DAY AS LONG AS YOU CALL BEFORE 4:00 PM  At the [facility], you and your health needs are our priority. As part of our continuing mission to provide you with exceptional heart care, we have created designated Provider Care Teams. These Care Teams include your primary Cardiologist (physician) and Advanced Practice Providers (APPs- Physician Assistants and Nurse Practitioners) who all work together to provide you with the care you need, when you need it.   You may see any of the following providers on your designated Care Team at your next follow up: Dr Glori Bickers Dr Loralie Champagne Dr. Roxana Hires, NP Lyda Jester, [place] [facility] [place], [place] Forestine Na, NP Audry Riles, PharmD   Please  be sure to bring in all your medications bottles to every appointment.

## 2022-03-18 NOTE — Progress Notes (Signed)
  Echocardiogram 2D Echocardiogram has been performed.  [name] 03/18/2022, 11:01 AM

## 2022-03-18 NOTE — Progress Notes (Signed)
Advanced Heart Failure Clinic Note   PCP: Cari Caraway, MD PCP-Cardiologist: Peter Martinique, MD  HF Cardiologist: Dr. Haroldine Laws  HPI:  [name] is a 88 y/o male, previously very active despite age before recent illness. Had been working 20-30 hr/week for [employer] and [employer].    Admitted in 11/21 with progressive decline in EF to 30-35% w/ global HK. RV systolic function mildly reduced. Also noted to have mod-severe AS.  Had subsequent R/LHC showing severe 3V CAD. C   Underwent CABG x 4 (LIMA-LAD, sequential SVG-OM1 and OM2, SVG- PDA) + tissue AVR by Dr. Cyndia Bent on 05/23/20. Post operative course c/b atrial fibrillation treated w/ amiodarone + difficulty weaning inotropes, fluid overload and hypotension. AHF consult to assist with management. He was discharged to [facility] 06/26/20. His discharge weight was 168 lbs.  Here today with his wife for f/u. Intermittent dizziness, fatigue, and leg weakness. Wakes up to either a "bad day" or a "good day". On bad days he barely moves around, is extra fatigued and will barely eat.  On good days he can go to gym with his wife, does the stationary bike for about 40 mins. Walks around his dining area 10-15 "laps". Has had recent falls, denies dizziness just missteps. Snores a lot, naps during the day. Last took extra lasix about a month ago. Denies CP, edema, orthopnea or PND. Denies SOB but visibly with pursed lip, shallow breaths on assessment.   Echo today EF 40-45%, RV mod reduced, mod MR, mild-mod TR, trivial AR, normal structure and function of the aortic valve prosthesis  ROS: All systems reviewed and negative except as per HPI.   Past Medical History:  Diagnosis Date   Aortic stenosis    Arthritis    CAD (coronary artery disease)    CHF (congestive heart failure) (HCC)    Colitis    Edema    Essential hypertension    no med now   Essential tremor    Hyperlipidemia    Hypotonic bladder    L4-L5   Obstructive uropathy     Peripheral neuropathy    Elevated by Dr. love 2010   Prostate cancer [facility]) 2005    Current Outpatient Medications  Medication Sig Dispense Refill   apixaban (ELIQUIS) 5 MG TABS tablet Take 1 tablet by mouth twice daily 180 tablet 1   Ascorbic Acid (VITAMIN C) 100 MG tablet Take 100 mg by mouth daily.     aspirin 81 MG tablet Take 81 mg by mouth at bedtime.      Cholecalciferol (VITAMIN D) 50 MCG (2000 UT) tablet Take 1 tablet by mouth daily.     docusate sodium (COLACE) 100 MG capsule Take 100 mg by mouth daily as needed for mild constipation.     dutasteride (AVODART) 0.5 MG capsule Take 0.5 mg by mouth daily.     feeding supplement, ENSURE COMPLETE, (ENSURE COMPLETE) LIQD Take 237 mLs by mouth daily.     ferrous [id number] C-folic acid (TRINSICON / FOLTRIN) capsule Take 1 capsule by mouth 2 (two) times daily after a meal.     furosemide (LASIX) 40 MG tablet Take 40 mg by mouth daily.     losartan (COZAAR) 25 MG tablet Take 0.5 tablets (12.5 mg total) by mouth at bedtime. 15 tablet 10   melatonin 5 MG TABS Take 1 tablet (5 mg total) by mouth at bedtime as needed (sleep).  0   Multiple Vitamins-Minerals (CENTRUM SILVER PO) Take 1 tablet by mouth daily.  sulfaSALAzine (AZULFIDINE) 500 MG tablet Take 2,000 mg by mouth daily.     tamsulosin (FLOMAX) 0.4 MG CAPS Take 0.4 mg by mouth 2 (two) times daily.      vitamin B-12 (CYANOCOBALAMIN) 100 MCG tablet Take 1 tablet by mouth daily.     No current facility-administered medications for this encounter.   Allergies  Allergen Reactions   Simvastatin Other (See Comments)    "caused neuropathy pain"    Social History   Socioeconomic History   Marital status: Married    Spouse name: Not on file   Number of children: Not on file   Years of education: 18   Highest education level: Master's degree (e.g., MA, MS, MEng, MEd, MSW, MBA)  Occupational History   Not on file  Tobacco Use   Smoking status: Never   Smokeless  tobacco: Never  Vaping Use   Vaping Use: Never used  Substance and Sexual Activity   Alcohol use: Yes    Alcohol/week: 1.0 standard drink of alcohol    Types: 1 Glasses of wine per week    Comment: occ   Drug use: No   Sexual activity: Yes  Other Topics Concern   Not on file  Social History Narrative   Not on file   Social Determinants of Health   Financial Resource Strain: Not on file  Food Insecurity: No Food Insecurity (07/30/2020)   Hunger Vital Sign    Worried About Running Out of Food in the Last Year: Never true    Ran Out of Food in the Last Year: Never true  Transportation Needs: No Transportation Needs (07/30/2020)   PRAPARE - Hydrologist (Medical): No    Lack of Transportation (Non-Medical): No  Physical Activity: Not on file  Stress: Not on file  Social Connections: Not on file  Intimate Partner Violence: Not on file   Family History  Problem Relation Age of Onset   Hypertension Mother    Heart failure Mother    Diabetes Mother    Heart attack Brother    Heart attack Maternal Aunt    Vitals:   03/18/22 1106  BP: 126/82  Pulse: 72  SpO2: 93%  Weight: 76.7 kg (169 lb 3.2 oz)    Wt Readings from Last 3 Encounters:  03/18/22 76.7 kg (169 lb 3.2 oz)  01/27/22 77.8 kg (171 lb 9.6 oz)  07/14/21 81.4 kg (179 lb 8 oz)   PHYSICAL EXAM: General:  elderly appearing. Looks stated age.  HEENT: hard of hearing Neck: supple. JVD ~7 cm. Carotids 2+ bilat; no bruits. No lymphadenopathy or thyromegaly appreciated. Cor: PMI nondisplaced. Regular rate & rhythm. No rubs, gallops or murmurs. Lungs: shallow breaths, clear, diminished bases Abdomen: soft, nontender, nondistended. No hepatosplenomegaly. No bruits or masses. Good bowel sounds. Extremities: no cyanosis, clubbing, rash, edema  Neuro: alert & oriented x 3, cranial nerves grossly intact. moves all 4 extremities w/o difficulty. Affect pleasant.    ASSESSMENT & PLAN:  1. Severe 3V  CAD: - s/p CABGx 4 (LIMA-LAD, sequential SVG-OM1 and OM2, SVG- PDA) - Continue ASA 81 mg daily. - No s/s angina. Not on b-blokcer due to fatigue - Never started the crestor previously ordered. He is reluctant to start now.    2. Aortic Stenosis, s/p tissue AVR (05/23/20)                       - AV prosthesis ok on echo.   3.Chronic  Biventricular Heart Failure:  - Echo in 2014 showed normal LVEF 55-60% w/ G1DD.  - Echo 10/21 and EF 30-35% w/ global HK. RV systolic function mildly reduced. - LHC this admit w/ severe 3V CAD>>ICM. Now s/p CABG. Also w/ severe AS, possibly contributing to LV dysfunction, now s/p SAVR.  - Post-op echo 12/1 EF 30-35%, RV mildly reduced. AV prothesis ok. - Portable echo (1/22): EF 30-35%  - 5/22 ECHO EF 35-40%, mild-mod MR, Moderately reduced RV function  - Echo today EF 40-45%, RV mod reduced, mod MR, mild-mod TR, trivial AR, normal structure and function of the aortic valve prosthesis - About the same since last visit, NYHA III. Mostly due to fatigue  - Volume status stable today. Knows to take extra lasix if he notices swelling. Last took a month ago.  - Continue losartan to 12.5 qhs - Off digoxin with heart block.  - No beta blocker with AV block. - Not good SGLT2i candidate, self caths  - Not on spiro with low BP, SBP 90-100s at home - Labs today    4. Atrial Fibrillation, post op.   - In a fib, rate controlled - Previously on amio - Continue Eliquis 5 mg bid.  - No bleeding  5. CKD IIIa - check labs today   6. Urinary Retention - self caths 3x daily  7. Snoring/fatigue - high suspicion for OSA - check home sleep study   Earnie Larsson, AGACNP-BC  11:33 AM   Patient seen and examined with the above-signed Advanced Practice Provider and/or Housestaff. I personally reviewed laboratory data, imaging studies and relevant notes. I independently examined the patient and formulated the important aspects of the plan. I have edited the note to reflect  any of my changes or salient points. I have personally discussed the plan with the patient and/or family.  Here with his wife. Says he is having good days and bad days. Says about 2 days per week are good days and rest are bad. Gets very fatigued. Wife says he snores. Felt better after CR. Goes to the gym with his wife 2-3x/week and rides 45 mins on exercise bike. No CP or undue SOB. Occasional LE edema but improved with extra lasix.   Echo today EF 40-45% AVR working well. 3+ central MR   General:  Elderly No resp difficulty HEENT: normal Neck: supple. no JVD. Carotids 2+ bilat; no bruits. No lymphadenopathy or thryomegaly appreciated. Cor: PMI nondisplaced. Regular rate & rhythm. 2/6 MR Lungs: clear Abdomen: soft, nontender, nondistended. No hepatosplenomegaly. No bruits or masses. Good bowel sounds. Extremities: no cyanosis, clubbing, rash, edema Neuro: alert & orientedx3, cranial nerves grossly intact. moves all 4 extremities w/o difficulty. Affect pleasant  EF continues to improve post-op. No angina. HF under good control. Suspect fatigue more related to possible sleep apnea.  Will check labs. Place home sleep study.   Glori Bickers, MD  10:43 PM

## 2022-03-24 DIAGNOSIS — Z1389 Encounter for screening for other disorder: Secondary | ICD-10-CM | POA: Diagnosis not present

## 2022-03-24 DIAGNOSIS — Z6826 Body mass index (BMI) 26.0-26.9, adult: Secondary | ICD-10-CM | POA: Diagnosis not present

## 2022-03-24 DIAGNOSIS — Z0000 Encounter for general adult medical examination without abnormal findings: Secondary | ICD-10-CM | POA: Diagnosis not present

## 2022-03-29 ENCOUNTER — Telehealth: Payer: Self-pay

## 2022-03-29 DIAGNOSIS — Z23 Encounter for immunization: Secondary | ICD-10-CM | POA: Diagnosis not present

## 2022-03-29 NOTE — Patient Outreach (Signed)
  Care Coordination   03/29/2022 Name: [name] [name] MRN: [id number] DOB: 02-22-1928   Care Coordination Outreach Attempts:  An unsuccessful telephone outreach was attempted today to offer the patient information about available care coordination services as a benefit of their health plan.   Follow Up Plan:  Additional outreach attempts will be made to offer the patient care coordination information and services.   Encounter Outcome:  No Answer  Care Coordination Interventions Activated:  No   Care Coordination Interventions:  No, not indicated    [facility] [phone]

## 2022-03-30 DIAGNOSIS — R339 Retention of urine, unspecified: Secondary | ICD-10-CM | POA: Diagnosis not present

## 2022-04-08 ENCOUNTER — Telehealth: Payer: Self-pay

## 2022-04-08 NOTE — Patient Outreach (Signed)
  Care Coordination   04/08/2022 Name: Jguadalupe Opiela MRN: [id number] DOB: Jun 12, 1928   Care Coordination Outreach Attempts:  An unsuccessful telephone outreach was attempted today to offer the patient information about available care coordination services as a benefit of their health plan.   Follow Up Plan:  Additional outreach attempts will be made to offer the patient care coordination information and services.   Encounter Outcome:  No Answer  Care Coordination Interventions Activated:  No   Care Coordination Interventions:  No, not indicated    [place] [facility] [phone]

## 2022-04-13 ENCOUNTER — Encounter: Payer: Self-pay | Admitting: Cardiology

## 2022-04-13 DIAGNOSIS — G4733 Obstructive sleep apnea (adult) (pediatric): Secondary | ICD-10-CM

## 2022-04-18 NOTE — Procedures (Signed)
Patient Information Study Date: 04/13/22 Patient Name: [name] [name] Patient ID: [id number] Birth Date: 04-09-2028 Age: 88 Gender: Male BMI: 25.1 (W=170 lb, H=5' 9'') Stopbang: 5 Referring Physician: Pierre Bali, MD  TEST DESCRIPTION: Home sleep apnea testing was completed using the WatchPat, a Type 1 device, utilizing peripheral arterial tonometry (PAT), chest movement, actigraphy, pulse oximetry, pulse rate, body position and snore. AHI was calculated with apnea and hypopnea using valid sleep time as the denominator. RDI includes apneas, hypopneas, and RERAs. The data acquired and the scoring of sleep and all associated events were performed in accordance with the recommended standards and specifications as outlined in the AASM Manual for the Scoring of Sleep and Associated Events 2.2.0 (2015).   FINDINGS:   1. Mild Obstructive Sleep Apnea with AHI 13/hr.   2. No Central Sleep Apnea with pAHIc 0.5/hr.   3. Oxygen desaturations as low as 87%.   4. Mild snoring was present. O2 sats were < 88% for 0.3 min.   5. Total sleep time was 6 hrs and 18 min.   6. 11.8% of total sleep time was spent in REM sleep.   7. Normal sleep onset latency at 18 min.   8. Prolonged REM sleep onset latency at 177 min.   9. Total awakenings were 3.  10. Arrhythmia detection:  Suggestive of possible brief atrial fibrillation lasting 5 hours, 20 minutes and 58 seconds.  This is not diagnostic and further testing with outpatient telemetry monitoring is recommended.  DIAGNOSIS: Mild Obstructive Sleep Apnea (G47.33) Possible Atrial Fibrillation  RECOMMENDATIONS:   1.  Clinical correlation of these findings is necessary.  The decision to treat obstructive sleep apnea (OSA) is usually based on the presence of apnea symptoms or the presence of associated medical conditions such as Hypertension, Congestive Heart Failure, Atrial Fibrillation or Obesity.  The most common symptoms of OSA are snoring, gasping for  breath while sleeping, daytime sleepiness and fatigue.   2.  Initiating apnea therapy is recommended given the presence of symptoms and/or associated conditions. Recommend proceeding with one of the following:     a.  Auto-CPAP therapy with a pressure range of 5-20cm H2O.     b.  An oral appliance (OA) that can be obtained from certain dentists with expertise in sleep medicine.  These are primarily of use in non-obese patients with mild and moderate disease.     c.  An ENT consultation which may be useful to look for specific causes of obstruction and possible treatment options.     d.  If patient is intolerant to PAP therapy, consider referral to ENT for evaluation for hypoglossal nerve stimulator.   3.  Close follow-up is necessary to ensure success with CPAP or oral appliance therapy for maximum benefit.  4.  A follow-up oximetry study on CPAP is recommended to assess the adequacy of therapy and determine the need for supplemental oxygen or the potential need for Bi-level therapy.  An arterial blood gas to determine the adequacy of baseline ventilation and oxygenation should also be considered.  5.  Healthy sleep recommendations include:  adequate nightly sleep (normal 7-9 hrs/night), avoidance of caffeine after noon and alcohol near bedtime, and maintaining a sleep environment that is cool, dark and quiet.  6.  Weight loss for overweight patients is recommended.  Even modest amounts of weight loss can significantly improve the severity of sleep apnea.  7.  Snoring recommendations include:  weight loss where appropriate, side sleeping, and avoidance of alcohol  before bed.  8.  Operation of motor vehicle should be avoided when sleepy.  9.  Recommend outpatient event monitor if clinically indicated to evaluate for atrial arrhythmias.  Signature: Fransico Him, MD; [facility]; Tom Bean, [place] Board of Sleep Medicine Electronically Signed: 04/19/22

## 2022-04-19 ENCOUNTER — Other Ambulatory Visit: Payer: Self-pay

## 2022-04-19 ENCOUNTER — Ambulatory Visit: Payer: Medicare HMO | Attending: Internal Medicine

## 2022-04-19 DIAGNOSIS — G4733 Obstructive sleep apnea (adult) (pediatric): Secondary | ICD-10-CM

## 2022-04-22 DIAGNOSIS — I5042 Chronic combined systolic (congestive) and diastolic (congestive) heart failure: Secondary | ICD-10-CM | POA: Diagnosis not present

## 2022-04-22 DIAGNOSIS — I10 Essential (primary) hypertension: Secondary | ICD-10-CM | POA: Diagnosis not present

## 2022-04-22 DIAGNOSIS — E782 Mixed hyperlipidemia: Secondary | ICD-10-CM | POA: Diagnosis not present

## 2022-04-22 DIAGNOSIS — I480 Paroxysmal atrial fibrillation: Secondary | ICD-10-CM | POA: Diagnosis not present

## 2022-05-04 ENCOUNTER — Telehealth: Payer: Self-pay | Admitting: *Deleted

## 2022-05-04 NOTE — Telephone Encounter (Signed)
Message left to return a call to discuss sleep study results. 

## 2022-05-05 ENCOUNTER — Encounter: Payer: Self-pay | Admitting: Cardiology

## 2022-05-17 NOTE — Telephone Encounter (Signed)
Patient's wife returned a call to me and was given sleep study results and recommendations. She states that the two of them have discussed it and has decided that he does not want to use CPAP. She wants me to ask Dr Radford Pax if a oral appliance will be acceptable. She was very informative about CPAP and oral devices. States that their dentist specializes in OSA and they do have a dental discount card that will help with the cost. She feels that her husband will just let the CPAP "sit around like he does his $6000 hearing aids." Message will be sent to Dr Radford Pax for review and recommendations.

## 2022-05-17 NOTE — Telephone Encounter (Signed)
-----   Message from Sueanne Margarita, MD sent at 04/18/2022  8:04 PM EDT ----- Please let patient know that they have sleep apnea and recommend treating with CPAP.  Please order an auto CPAP from 4-15cm H2O with heated humidity and mask of choice.  Order overnight pulse ox on CPAP.  Followup with me in 6 weeks.

## 2022-05-19 NOTE — Telephone Encounter (Signed)
Left message per Dr Radford Pax ok for oral appliance evaluation. Call me back with where she would like for me to send the dental referral.

## 2022-05-24 NOTE — Telephone Encounter (Signed)
Wife called back and request for the dental referral to be sent to Dr Tamala Bari.

## 2022-05-25 DIAGNOSIS — R339 Retention of urine, unspecified: Secondary | ICD-10-CM | POA: Diagnosis not present

## 2022-05-31 ENCOUNTER — Other Ambulatory Visit (HOSPITAL_COMMUNITY): Payer: Self-pay | Admitting: *Deleted

## 2022-05-31 MED ORDER — LOSARTAN POTASSIUM 25 MG PO TABS: 12.5000 mg | ORAL_TABLET | Freq: Every day | ORAL | 3 refills | Status: DC

## 2022-08-02 DIAGNOSIS — H524 Presbyopia: Secondary | ICD-10-CM | POA: Diagnosis not present

## 2022-08-02 DIAGNOSIS — H01001 Unspecified blepharitis right upper eyelid: Secondary | ICD-10-CM | POA: Diagnosis not present

## 2022-08-02 DIAGNOSIS — Z961 Presence of intraocular lens: Secondary | ICD-10-CM | POA: Diagnosis not present

## 2022-08-02 DIAGNOSIS — H01004 Unspecified blepharitis left upper eyelid: Secondary | ICD-10-CM | POA: Diagnosis not present

## 2022-08-02 DIAGNOSIS — H353132 Nonexudative age-related macular degeneration, bilateral, intermediate dry stage: Secondary | ICD-10-CM | POA: Diagnosis not present

## 2022-08-26 DIAGNOSIS — E440 Moderate protein-calorie malnutrition: Secondary | ICD-10-CM | POA: Diagnosis not present

## 2022-08-26 DIAGNOSIS — Z79899 Other long term (current) drug therapy: Secondary | ICD-10-CM | POA: Diagnosis not present

## 2022-08-26 DIAGNOSIS — R5383 Other fatigue: Secondary | ICD-10-CM | POA: Diagnosis not present

## 2022-08-26 DIAGNOSIS — R29898 Other symptoms and signs involving the musculoskeletal system: Secondary | ICD-10-CM | POA: Diagnosis not present

## 2022-08-26 DIAGNOSIS — D696 Thrombocytopenia, unspecified: Secondary | ICD-10-CM | POA: Diagnosis not present

## 2022-08-26 DIAGNOSIS — G479 Sleep disorder, unspecified: Secondary | ICD-10-CM | POA: Diagnosis not present

## 2022-08-26 DIAGNOSIS — R7309 Other abnormal glucose: Secondary | ICD-10-CM | POA: Diagnosis not present

## 2022-08-26 DIAGNOSIS — N1831 Chronic kidney disease, stage 3a: Secondary | ICD-10-CM | POA: Diagnosis not present

## 2022-08-26 DIAGNOSIS — R2689 Other abnormalities of gait and mobility: Secondary | ICD-10-CM | POA: Diagnosis not present

## 2022-08-26 DIAGNOSIS — I5042 Chronic combined systolic (congestive) and diastolic (congestive) heart failure: Secondary | ICD-10-CM | POA: Diagnosis not present

## 2022-08-26 DIAGNOSIS — R69 Illness, unspecified: Secondary | ICD-10-CM | POA: Diagnosis not present

## 2022-08-26 DIAGNOSIS — I480 Paroxysmal atrial fibrillation: Secondary | ICD-10-CM | POA: Diagnosis not present

## 2022-08-26 DIAGNOSIS — D6869 Other thrombophilia: Secondary | ICD-10-CM | POA: Diagnosis not present

## 2022-09-08 NOTE — Progress Notes (Unsigned)
Cardiology Office Note   Date:  09/09/2022   ID:  [name] [name], DOB 06-18-1928, MRN [id number]  PCP:  Cari Caraway, MD  Cardiologist:   Kena Limon Martinique, MD   Chief Complaint  Patient presents with   Congestive Heart Failure       History of Present Illness: [name] [name] is a 89 y.o. male who is seen for follow up CHF, AS. He has a history of venous insufficiency with chronic lower extremity edema,hypertension, hyperlipidemia, peripheral neuropathy, essential tremor, and prostate cancer.   Patient first seen  in 09/2012 for evaluation of dyspnea on exertion and lower extremity edema, especially in his left leg. Venous dopplers were ordered and showed no DVT. Echo showed LVEF of 55-60% with moderate asymmetric basal septal hypertrophy with no LV outflow tract gradient and mild to moderate MR. He was seen  in 07/2017 at which time he was doing well. He denied any chest pain or shortness of breath. He continued to report some lower extremity edema that resolved with elevation of legs at night. Edema felt to be due to venous insufficiency and conservative management with sodium restriction, elevation of legs, and compression stockings was recommended.    He was diagnosed with right middle lobe pneumonia on 10/12/2019 after presenting for further evaluation of fatigue while on vacation at the beach.. He tested negative for COVID. He was started on Levaquin but this had to be stopped when he developed tendonitis. He was started on Azithromycin instead on 10/26/2019. Repeat chest x-ray on 10/26/2019 showed increased density of right lower lobe pneumonia. He had persistent fatigue  and developed significant lower extremity edema and weight gain. Patient was seen by PCP again on 11/14/2019. Repeat chest x-ray showed continued right basilar pneumonia and increased right pleural effusion. BNP was checked and came back elevated at 1,380. He was started on Lasix '80mg'$  daily. Chest CT was also ordered to rule out  underlying lung mass. CT was performed on 11/15/2019 and showed mild cardiomegaly, moderate right pleural effusion with adjacent atelectasis or pneumonia in the right lower and middle lobes as well as coronary artery calcifications. Of note, prior to being started on Lasix, he was seen by Urology for urinary retention and had a catheter placed.  This was later removed and he performed in and out cath.   He had an Echo done in June 2021 showing EF 40-45% with severe HK of the entire inferior wall. There was severe basal septal hypertrophy with gr 2 diastolic dysfunction. Moderate MR, mild AS and mild Aortic root enlargement. We started him on losartan 25 mg daily but dose had to be reduced to 12.5 mg due to hypotension. He was seen in September with worsening CHF with weight gain, edema and worsening fatigue. Lasix dose was increased. Echo showed further decline in LV function with EF 30-35%. AS was felt to be moderate to severe ( low flow AS).   He subsequently underwent cardiac cath. This demonstrated severe 3 vessel CAD with complex anatomy as well as low flow severe AS.  He was seen in consultation with Dr Cyndia Bent and underwent combined AVR with a #29 Edwards Inspiris Resilia valve and CABG with LIMA to the LAD, sequential SVG to OM1 and OM2, and SVG to the PDA on 05/23/20.  His post op course was complicated by persistent low output CHF and hypotension. No beta blocker due to hypotension. No digoxin due to some AV block. Was followed inpatient by the Heart failure service. Was on  Dobutamine until Dec 18 then weaned off. He had some post op Afib but converted to NSR. He was placed on midodrine for hypotension. He had significant debility and was evaluated by OT an PT. He was treated for PNA. Repeat Echo showed EF 35% with good AVR prosthesis function. He was DC to SNF on 06/26/20.   Followed in Advanced heart failure clinic. Was felt to be in Afib and DCCV arranged but when he presented to the hospital he was  in NSR.   When last seen we started him on Entresto. He was seen in the Advanced heart failure clinic on 5/11 and was experiencing orthostasis and was switched back to losartan. Amiodarone discontinued. Was on lasix about every other day. Echo was repeated with EF 35-40%. Stable AV prosthesis. Mild to mod MR. Moderate RV dysfunction.   Was seen in Advanced heart failure clinic in Dec. Noted symptoms of fatigue. Losartan dose reduced due to low BP. Home sleep study recommended but they never followed through.   He is seen with his wife today. His major complaint is of lack of energy.  He is taking lasix twice a day now with increased right leg edema. . No chest pain, palpitations, orthopnea. He is able to walk around his kitchen and bedroom 35 times. He is riding recumbent bike at the gym every other day for 40 minutes. He does complain of constipation. His weight is actually down 8 lbs. States he gets full easily.   Was seen in Advanced heart failure clinic in September. Noted fatigue but otherwise stable. Echo showed EF 40-45%, improved.  RV mod reduced, mod MR, mild-mod TR, trivial AR, normal structure and function of the aortic valve prosthesis. Home sleep study recommended. This showed only mild OSA.   On follow up today he is doing OK. His main complaint is that he feels bogged down like he has an anchor. Has chronic runny nose. Goes to gym a couple times a week at wife's insistence. PCP has prescribed PT. He is sleeping better. No increased SOB, edema. No chest pain.  Past Medical History:  Diagnosis Date   Aortic stenosis    Arthritis    CAD (coronary artery disease)    CHF (congestive heart failure) (HCC)    Colitis    Edema    Essential hypertension    no med now   Essential tremor    Hyperlipidemia    Hypotonic bladder    L4-L5   Obstructive uropathy    Peripheral neuropathy    Elevated by Dr. love 2010   Prostate cancer [facility]) 2005    Past Surgical History:  Procedure  Laterality Date   ACHILLES TENDON SURGERY Bilateral 1995   AORTIC VALVE REPLACEMENT N/A 05/23/2020   Procedure: AORTIC VALVE REPLACEMENT (AVR) USING EDWARDS RESILIA 29 MM AORTIC VALVE.;  Surgeon: Gaye Pollack, MD;  Location: [facility];  Service: Open Heart Surgery;  Laterality: N/A;   CARDIAC CATHETERIZATION     COLONOSCOPY     With polyp resection   CORONARY ARTERY BYPASS GRAFT N/A 05/23/2020   Procedure: CORONARY ARTERY BYPASS GRAFTING (CABG) USING LIMA to LAD; ENDOSCOPIC HARVESTED RIGHT GREATER SAPHENOUS VEIN: SVG to PD; SVG to SEQUENCED INTERMEDIATE to OM;  Surgeon: Gaye Pollack, MD;  Location: [place];  Service: Open Heart Surgery;  Laterality: N/A;   ENDOVEIN HARVEST OF GREATER SAPHENOUS VEIN Right 05/23/2020   Procedure: ENDOVEIN HARVEST OF GREATER SAPHENOUS VEIN;  Surgeon: Gaye Pollack, MD;  Location: [place];  Service: Open Heart Surgery;  Laterality: Right;   REVERSE SHOULDER ARTHROPLASTY Right 06/04/2016   REVERSE SHOULDER ARTHROPLASTY Right 06/04/2016   Procedure: REVERSE SHOULDER ARTHROPLASTY;  Surgeon: Netta Cedars, MD;  Location: [place];  Service: Orthopedics;  Laterality: Right;   RIGHT/LEFT HEART CATH AND CORONARY ANGIOGRAPHY N/A 05/20/2020   Procedure: RIGHT/LEFT HEART CATH AND CORONARY ANGIOGRAPHY;  Surgeon: Martinique, Naida Escalante M, MD;  Location: [place] [facility];  Service: Cardiovascular;  Laterality: N/A;   SKIN CANCER EXCISION  1974   On chest wall   TEE WITHOUT CARDIOVERSION N/A 05/23/2020   Procedure: TRANSESOPHAGEAL ECHOCARDIOGRAM (TEE);  Surgeon: Gaye Pollack, MD;  Location: [place];  Service: Open Heart Surgery;  Laterality: N/A;   TOTAL KNEE ARTHROPLASTY Right 2011   TOTAL KNEE ARTHROPLASTY Left 04/09/2013   Procedure: LEFT TOTAL KNEE ARTHROPLASTY;  Surgeon: Gearlean Alf, MD;  Location: [facility];  Service: Orthopedics;  Laterality: Left;     Current Outpatient Medications  Medication Sig Dispense Refill   Ascorbic Acid (VITAMIN C) 100 MG tablet Take 100 mg by mouth  daily.     aspirin 81 MG tablet Take 81 mg by mouth at bedtime.      Cholecalciferol (VITAMIN D) 50 MCG (2000 UT) tablet Take 1 tablet by mouth daily.     docusate sodium (COLACE) 100 MG capsule Take 100 mg by mouth daily as needed for mild constipation.     dutasteride (AVODART) 0.5 MG capsule Take 0.5 mg by mouth daily.     feeding supplement, ENSURE COMPLETE, (ENSURE COMPLETE) LIQD Take 237 mLs by mouth daily.     ferrous [id number] C-folic acid (TRINSICON / FOLTRIN) capsule Take 1 capsule by mouth 2 (two) times daily after a meal.     furosemide (LASIX) 40 MG tablet Take 40 mg by mouth daily.     losartan (COZAAR) 25 MG tablet Take 0.5 tablets (12.5 mg total) by mouth at bedtime. 45 tablet 3   melatonin 5 MG TABS Take 1 tablet (5 mg total) by mouth at bedtime as needed (sleep).  0   Multiple Vitamins-Minerals (CENTRUM SILVER PO) Take 1 tablet by mouth daily.      sulfaSALAzine (AZULFIDINE) 500 MG tablet Take 2,000 mg by mouth daily.     tamsulosin (FLOMAX) 0.4 MG CAPS Take 0.4 mg by mouth 2 (two) times daily.      vitamin B-12 (CYANOCOBALAMIN) 100 MCG tablet Take 1 tablet by mouth daily.     apixaban (ELIQUIS) 5 MG TABS tablet Take 1 tablet (5 mg total) by mouth 2 (two) times daily. 180 tablet 3   No current facility-administered medications for this visit.    Allergies:   Simvastatin    Social History:  The patient  reports that he has never smoked. He has never used smokeless tobacco. He reports current alcohol use of about 1.0 standard drink of alcohol per week. He reports that he does not use drugs.   Family History:  The patient's family history includes Diabetes in his mother; Heart attack in his brother and maternal aunt; Heart failure in his mother; Hypertension in his mother.    ROS:  Please see the history of present illness.   Otherwise, review of systems are positive for none.   All other systems are reviewed and negative.    PHYSICAL EXAM: VS:  BP 110/60    Pulse 71   Ht '5\' 9"'$  (1.753 m)   Wt 169 lb 6.4 oz (76.8 kg)   SpO2 96%  BMI 25.02 kg/m  , BMI Body mass index is 25.02 kg/m. GEN: elderly WM, well developed, in no acute distress  HEENT: normal  Neck: + JVD to 6 cm, no carotid bruits, or masses Cardiac: RRR; gr 1/6 systolic murmur at the RUSB. No  rubs, or gallops.  Respiratory:  left basilar rales.  GI  soft, nontender, nondistended, + BS MS: no deformity or atrophy  Skin: warm and dry, no rash Ext: Tr right pretibial edema. Trace on left.  Neuro:  Strength and sensation are intact Psych: euthymic mood, full affect   EKG:  EKG is not ordered today.  Recent Labs: 03/18/2022: B Natriuretic Peptide 779.6; BUN 14; Creatinine, Ser 1.20; Hemoglobin 12.4; Platelets 210; Potassium 4.0; Sodium 137    Lipid Panel No results found for: "CHOL", "TRIG", "HDL", "CHOLHDL", "VLDL", "LDLCALC", "LDLDIRECT"    Wt Readings from Last 3 Encounters:  09/09/22 169 lb 6.4 oz (76.8 kg)  03/18/22 169 lb 3.2 oz (76.7 kg)  01/27/22 171 lb 9.6 oz (77.8 kg)    - 10/12/2019: WBC 7.6, Hgb 14.2, Plts 246. Na 140, K 4.4, Glucose 103, BUN 12, Cr 1.26. AST 29, ALT 21, Alk Phos 61, Total Bili 0.5. TSH 0.849. UA showed '30mg'$  of protein but was otherwise unremarkable.  - 10/26/2019: Cr 1.20.  - 11/14/2019: BNP 1,380. Na 140, K 4.6, Glucose 107, BUN 22, Cr 1.16. AST 26, ALT, Alk Phos 59.  Dated 12/06/19: BUN 27, creatinine 1.4. CBC and CMET normal. Labs dated1/20/22: BUN 18, creatinine 1.2. Lytes OK. Hgb 11.1. normal WBC. BNP 2010.  Dated 08/24/21: BUN 27. Creatinine 1.2. otherwise CMET normal, TSH normal.  Dated 11/20/21: normal CBC.  Dated 229/24: A1c 5.5%, BUN 35, creatinine 1.35. otherwise BMET normal. CBC normal.   CXR report 07/15/20: moderate bilateral pleural effusions.   Other studies Reviewed: Additional studies/ records that were reviewed today include:  Echo 12/10/19: IMPRESSIONS     1. Left ventricular ejection fraction, by estimation, is 40 to 45%.  The  left ventricle has mildly decreased function. The left ventricle  demonstrates global hypokinesis. There is severe left ventricular  hypertrophy of the basal-septal segment. Left  ventricular diastolic parameters are consistent with Grade II diastolic  dysfunction (pseudonormalization). There is severe hypokinesis of the left  ventricular, entire inferior wall and inferoseptal wall.   2. Right ventricular systolic function is normal. The right ventricular  size is mildly enlarged. There is normal pulmonary artery systolic  pressure. The estimated right ventricular systolic pressure is [id number] mmHg.   3. Left atrial size was mild to moderately dilated.   4. The mitral valve is normal in structure. Moderate mitral valve  regurgitation. No evidence of mitral stenosis.   5. The aortic valve is tricuspid. Aortic valve regurgitation is not  visualized. Mild aortic valve stenosis. Aortic valve area, by VTI measures  1.51 cm. Aortic valve mean gradient measures 20.0 mmHg. Aortic valve Vmax  measures 2.46 m/s. The degree of AS   may be underestimated in the setting of LV dysfunction. rree-=--   6. Aortic dilatation noted. There is mild dilatation of the aortic root  and of the ascending aorta measuring 42 mm and 79m respectively.   7. The inferior vena cava is dilated in size with >50% respiratory  variability, suggesting right atrial pressure of 8 mmHg.    Echo 04/01/20: IMPRESSIONS     1. LVEF has decreased from 12/10/2019, now 30-35% with LV dyssynchrony.  Aortic stenosis is most probably severe with low flow  low gradient,  dimensionless index 0.26.   2. Left ventricular ejection fraction, by estimation, is 30 to 35%. The  left ventricle has moderately decreased function. The left ventricle  demonstrates global hypokinesis. The left ventricular internal cavity size  was mildly dilated. There is severe  asymmetric left ventricular hypertrophy of the basal-septal segment. Left   ventricular diastolic function could not be evaluated.   3. Right ventricular systolic function is mildly reduced. The right  ventricular size is mildly enlarged. There is moderately elevated  pulmonary artery systolic pressure. The estimated right ventricular  systolic pressure is [id number] mmHg.   4. Left atrial size was severely dilated.   5. Right atrial size was moderately dilated.   6. The mitral valve is normal in structure. Moderate mitral valve  regurgitation. No evidence of mitral stenosis.   7. The aortic valve is normal in structure. Aortic valve regurgitation is  not visualized. Moderate to severe aortic valve stenosis. Aortic valve  mean gradient measures 24.0 mmHg.   8. Aortic dilatation noted. There is mild to moderate dilatation of the  ascending aorta, measuring 44 mm.   9. The inferior vena cava is dilated in size with <50% respiratory  variability, suggesting right atrial pressure of 15 mmHg.   Echo 11/05/20: IMPRESSIONS     1. Left ventricular ejection fraction, by estimation, is 35 to 40%. The  left ventricle has moderately decreased function. The left ventricle  demonstrates regional wall motion abnormalities (see scoring  diagram/findings for description). There is  moderate left ventricular hypertrophy. Left ventricular diastolic  parameters are consistent with Grade I diastolic dysfunction (impaired  relaxation). There is akinesis of the left ventricular, entire inferior  wall.   2. Right ventricular systolic function is moderately reduced. The right  ventricular size is moderately enlarged. There is normal pulmonary artery  systolic pressure.   3. Left atrial size was severely dilated.   4. Right atrial size was severely dilated.   5. The mitral valve is normal in structure. Mild to moderate mitral valve  regurgitation. No evidence of mitral stenosis.   6. The aortic valve has been repaired/replaced. Aortic valve  regurgitation is trivial. No aortic stenosis  is present. There is a 29 mm  Edwards bioprosthetic valve present in the aortic position. Procedure  Date: 05/23/2020. Echo findings are consistent  with normal structure and function of the aortic valve prosthesis.   7. Aortic dilatation noted. There is mild dilatation of the aortic root,  measuring 42 mm. There is mild dilatation of the ascending aorta,  measuring 43 mm.   8. The inferior vena cava is normal in size with greater than 50%  respiratory variability, suggesting right atrial pressure of 3 mmHg.   Echo 03/18/22: IMPRESSIONS     1. Left ventricular ejection fraction, by estimation, is 40 to 45%. The  left ventricle has mildly decreased function. The left ventricle  demonstrates regional wall motion abnormalities (see scoring  diagram/findings for description). There is severe  asymmetric left ventricular hypertrophy of the basal-septal segment. Left  ventricular diastolic parameters are indeterminate. There is akinesis of  the left ventricular, entire inferior wall and inferolateral wall.   2. Right ventricular systolic function is moderately reduced. The right  ventricular size is mildly enlarged. There is normal pulmonary artery  systolic pressure.   3. Left atrial size was severely dilated.   4. Right atrial size was severely dilated.   5. The mitral valve is degenerative. Moderate mitral valve regurgitation.  No  evidence of mitral stenosis.   6. Tricuspid valve regurgitation is mild to moderate.   7. The aortic valve has been repaired/replaced. Aortic valve  regurgitation is trivial. No aortic stenosis is present. There is a 29 mm  bioprosthetic valve present in the aortic position. Procedure Date:  05/23/2020. Echo findings are consistent with  normal structure and function of the aortic valve prosthesis. Aortic valve  area, by VTI measures 4.62 cm. Aortic valve mean gradient measures 5.0  mmHg. Aortic valve Vmax measures 1.42 m/s.   8. The inferior vena cava is  normal in size with greater than 50%  respiratory variability, suggesting right atrial pressure of 3 mmHg.    ASSESSMENT AND PLAN:  1. Chronic combined systolic/diastolic CHF. EF 30-35%.  Patient is s/p AVR for severe AS and CABG for severe multivessel CAD. Prolonged hospital course due to persistent CHF with low output.  - clinically is doing better. Encourage participation in PT  - volume status looks good.  - intolerant of Entresto due to low BP. On very low dose losartan  -daily weight - sodium and fluid restriction. - not a candidate for dig due to AV block.  - poor candidate for SGLT-2 inhibitor due to urinary retention. - last Echo showed improved EF    2. Severe low flow AS.  - s/p AVR. Follow up Echo showed good valve function.    3. CAD severe s/p CABG. No active angina.   4. Recurrent Afib. Rate is controlled. On Eliquis. Rate controlled.   5. Severe deconditioning improving.   6.  Hyperlipidemia - statin therapy held post op due to severe deconditioning. At his age not sure of benefit in resuming.   7. CKD. Stage 3a   Follow up in 6 months.  Signed, Emmalin Jaquess Martinique, MD  09/09/2022 2:37 PM    [place] [address] [place], [place], [address] Phone [phone], Fax [phone]

## 2022-09-09 ENCOUNTER — Ambulatory Visit: Payer: Medicare HMO | Attending: Cardiology | Admitting: Cardiology

## 2022-09-09 ENCOUNTER — Other Ambulatory Visit: Payer: Self-pay

## 2022-09-09 ENCOUNTER — Encounter: Payer: Self-pay | Admitting: Cardiology

## 2022-09-09 VITALS — BP 110/60 | HR 71 | Ht 69.0 in | Wt 169.4 lb

## 2022-09-09 DIAGNOSIS — Z951 Presence of aortocoronary bypass graft: Secondary | ICD-10-CM

## 2022-09-09 DIAGNOSIS — I480 Paroxysmal atrial fibrillation: Secondary | ICD-10-CM | POA: Diagnosis not present

## 2022-09-09 DIAGNOSIS — Z952 Presence of prosthetic heart valve: Secondary | ICD-10-CM

## 2022-09-09 DIAGNOSIS — I5042 Chronic combined systolic (congestive) and diastolic (congestive) heart failure: Secondary | ICD-10-CM

## 2022-09-09 MED ORDER — APIXABAN 5 MG PO TABS: 5.0000 mg | ORAL_TABLET | Freq: Two times a day (BID) | ORAL | 3 refills | Status: DC

## 2022-09-16 DIAGNOSIS — R262 Difficulty in walking, not elsewhere classified: Secondary | ICD-10-CM | POA: Diagnosis not present

## 2022-09-16 DIAGNOSIS — M6281 Muscle weakness (generalized): Secondary | ICD-10-CM | POA: Diagnosis not present

## 2022-09-23 ENCOUNTER — Ambulatory Visit (HOSPITAL_COMMUNITY)
Admission: RE | Admit: 2022-09-23 | Discharge: 2022-09-23 | Disposition: A | Discharge status: Home / Self Care | Payer: Medicare HMO | Source: Ambulatory Visit | Attending: Internal Medicine | Admitting: Internal Medicine

## 2022-09-23 ENCOUNTER — Encounter (HOSPITAL_COMMUNITY): Payer: Self-pay | Admitting: Internal Medicine

## 2022-09-23 VITALS — BP 90/60 | HR 66 | Wt 167.6 lb

## 2022-09-23 DIAGNOSIS — I482 Chronic atrial fibrillation, unspecified: Secondary | ICD-10-CM | POA: Diagnosis not present

## 2022-09-23 DIAGNOSIS — R339 Retention of urine, unspecified: Secondary | ICD-10-CM | POA: Diagnosis not present

## 2022-09-23 DIAGNOSIS — I350 Nonrheumatic aortic (valve) stenosis: Secondary | ICD-10-CM | POA: Insufficient documentation

## 2022-09-23 DIAGNOSIS — Z7982 Long term (current) use of aspirin: Secondary | ICD-10-CM | POA: Insufficient documentation

## 2022-09-23 DIAGNOSIS — Z951 Presence of aortocoronary bypass graft: Secondary | ICD-10-CM | POA: Insufficient documentation

## 2022-09-23 DIAGNOSIS — I2510 Atherosclerotic heart disease of native coronary artery without angina pectoris: Secondary | ICD-10-CM | POA: Diagnosis not present

## 2022-09-23 DIAGNOSIS — I5082 Biventricular heart failure: Secondary | ICD-10-CM | POA: Insufficient documentation

## 2022-09-23 DIAGNOSIS — R5383 Other fatigue: Secondary | ICD-10-CM | POA: Insufficient documentation

## 2022-09-23 DIAGNOSIS — I130 Hypertensive heart and chronic kidney disease with heart failure and stage 1 through stage 4 chronic kidney disease, or unspecified chronic kidney disease: Secondary | ICD-10-CM | POA: Insufficient documentation

## 2022-09-23 DIAGNOSIS — I480 Paroxysmal atrial fibrillation: Secondary | ICD-10-CM | POA: Diagnosis not present

## 2022-09-23 DIAGNOSIS — Z952 Presence of prosthetic heart valve: Secondary | ICD-10-CM | POA: Diagnosis not present

## 2022-09-23 DIAGNOSIS — Z7901 Long term (current) use of anticoagulants: Secondary | ICD-10-CM | POA: Diagnosis not present

## 2022-09-23 DIAGNOSIS — N1831 Chronic kidney disease, stage 3a: Secondary | ICD-10-CM | POA: Insufficient documentation

## 2022-09-23 DIAGNOSIS — I5042 Chronic combined systolic (congestive) and diastolic (congestive) heart failure: Secondary | ICD-10-CM | POA: Insufficient documentation

## 2022-09-23 NOTE — Patient Instructions (Signed)
STOP Losartan  Your physician wants you to follow-up in: 6 months with Dr Haroldine Laws. You will receive a reminder letter in the mail two months in advance. If you don't receive a letter, please call our office to schedule the follow-up appointment.     You are scheduled for a Cardioversionon 10/07/2022 with Dr. Haroldine Laws.  Please arrive at the [facility] (Main Entrance A) at [facility]: [address] [place], [place] [address] at 800 am/pm.  DIET: Nothing to eat or drink after midnight except a sip of water with medications (see medication instructions below)  Medication Instructions: Hold lasix  Continue your anticoagulant: Eliquis You will need to continue your anticoagulant after your procedure until you  are told by your  Provider that it is safe to stop   Labs:  your lab work will be done at the hospital prior to your procedure - you will need to arrive 1  hours ahead of your procedure  You must have a responsible person to drive you home and stay in the waiting area during your procedure. Failure to do so could result in cancellation.  Bring your insurance cards.  *Special Note: Every effort is made to have your procedure done on time. Occasionally there are emergencies that occur at the hospital that may cause delays. Please be patient if a delay does occur.    Do the following things EVERYDAY: Weigh yourself in the morning before breakfast. Write it down and keep it in a log. Take your medicines as prescribed Eat low salt foods--Limit salt (sodium) to 2000 mg per day.  Stay as active as you can everyday Limit all fluids for the day to less than 2 liters  At the [facility], you and your health needs are our priority. As part of our continuing mission to provide you with exceptional heart care, we have created designated Provider Care Teams. These Care Teams include your primary Cardiologist (physician) and Advanced Practice Providers (APPs-  Physician Assistants and Nurse Practitioners) who all work together to provide you with the care you need, when you need it.   You may see any of the following providers on your designated Care Team at your next follow up: Dr Glori Bickers Dr Loralie Champagne Dr. Roxana Hires, NP Lyda Jester, [place] [facility] [place], [place] Forestine Na, NP Audry Riles, PharmD   Please be sure to bring in all your medications bottles to every appointment.    Thank you for choosing [facility]   If you have any questions or concerns before your next appointment please send [place] a message through [place] or call our office at [phone].    TO LEAVE A MESSAGE FOR THE NURSE SELECT OPTION 2, PLEASE LEAVE A MESSAGE INCLUDING: YOUR NAME DATE OF BIRTH CALL BACK NUMBER REASON FOR CALL**this is important as we prioritize the call backs  YOU WILL RECEIVE A CALL BACK THE SAME DAY AS LONG AS YOU CALL BEFORE 4:00 PM

## 2022-09-23 NOTE — H&P (View-Only) (Signed)
 Advanced Heart Failure Clinic Note   PCP: McNeill, Wendy, MD PCP-Cardiologist: Peter Jordan, MD  HF Cardiologist: Dr. Noah Lembke  HPI:  [name] [name] is a 89 y/o male, previously very active despite age before recent illness. Had been working 20-30 hr/week for [employer] and [employer].    Admitted in 11/21 with progressive decline in EF to 30-35% w/ global HK. RV systolic function mildly reduced. Also noted to have mod-severe AS.  Had subsequent R/LHC showing severe 3V CAD. C   Underwent CABG x 4 (LIMA-LAD, sequential SVG-OM1 and OM2, SVG- PDA) + tissue AVR by Dr. Bartle on 05/23/20. Post operative course c/b atrial fibrillation treated w/ amiodarone + difficulty weaning inotropes, fluid overload and hypotension. AHF consult to assist with management. He was discharged to SNF 06/26/20. His discharge weight was 168 lbs.  Sleep study 10/23 AHI 13 Decided against CPAP  Here today with his wife for f/u. Feels better. More energy on some day but other days just feels bogged down. Dr. McNeill referred him for PT. Able to do ADLs and get around the first floor of his house. Walking laps around the dining room table when he ffels ok  No CP, edema, orthopnea or PND.   Echo 9/23 EF 40-45%, RV mod reduced, mod MR, mild-mod TR, trivial AR, normal structure and function of the aortic valve prosthesis  ROS: All systems reviewed and negative except as per HPI.   Past Medical History:  Diagnosis Date   Aortic stenosis    Arthritis    CAD (coronary artery disease)    CHF (congestive heart failure) (HCC)    Colitis    Edema    Essential hypertension    no med now   Essential tremor    Hyperlipidemia    Hypotonic bladder    L4-L5   Obstructive uropathy    Peripheral neuropathy    Elevated by Dr. love 2010   Prostate cancer (HCC) 2005    Current Outpatient Medications  Medication Sig Dispense Refill   apixaban (ELIQUIS) 5 MG TABS tablet Take 1 tablet (5 mg total) by mouth 2  (two) times daily. 180 tablet 3   Ascorbic Acid (VITAMIN C) 100 MG tablet Take 100 mg by mouth daily.     aspirin 81 MG tablet Take 81 mg by mouth at bedtime.      Cholecalciferol (VITAMIN D) 50 MCG (2000 UT) tablet Take 1 tablet by mouth daily.     docusate sodium (COLACE) 100 MG capsule Take 100 mg by mouth daily as needed for mild constipation.     dutasteride (AVODART) 0.5 MG capsule Take 0.5 mg by mouth daily.     feeding supplement, ENSURE COMPLETE, (ENSURE COMPLETE) LIQD Take 237 mLs by mouth daily.     ferrous fumarate-b12-vitamic C-folic acid (TRINSICON / FOLTRIN) capsule Take 1 capsule by mouth 2 (two) times daily after a meal.     furosemide (LASIX) 40 MG tablet Take 40 mg by mouth daily.     losartan (COZAAR) 25 MG tablet Take 0.5 tablets (12.5 mg total) by mouth at bedtime. 45 tablet 3   melatonin 5 MG TABS Take 1 tablet (5 mg total) by mouth at bedtime as needed (sleep).  0   Multiple Vitamins-Minerals (CENTRUM SILVER PO) Take 1 tablet by mouth daily.      sulfaSALAzine (AZULFIDINE) 500 MG tablet Take 2,000 mg by mouth daily.     tamsulosin (FLOMAX) 0.4 MG CAPS Take 0.4 mg by mouth 2 (two) times   daily.      vitamin B-12 (CYANOCOBALAMIN) 100 MCG tablet Take 1 tablet by mouth daily.     No current facility-administered medications for this encounter.   Allergies  Allergen Reactions   Simvastatin Other (See Comments)    "caused neuropathy pain"    Social History   Socioeconomic History   Marital status: Married    Spouse name: Not on file   Number of children: Not on file   Years of education: 18   Highest education level: Master's degree (e.g., MA, MS, MEng, MEd, MSW, MBA)  Occupational History   Not on file  Tobacco Use   Smoking status: Never   Smokeless tobacco: Never  Vaping Use   Vaping Use: Never used  Substance and Sexual Activity   Alcohol use: Yes    Alcohol/week: 1.0 standard drink of alcohol    Types: 1 Glasses of wine per week    Comment: occ   Drug  use: No   Sexual activity: Yes  Other Topics Concern   Not on file  Social History Narrative   Not on file   Social Determinants of Health   Financial Resource Strain: Not on file  Food Insecurity: No Food Insecurity (07/30/2020)   Hunger Vital Sign    Worried About Running Out of Food in the Last Year: Never true    Ran Out of Food in the Last Year: Never true  Transportation Needs: No Transportation Needs (07/30/2020)   PRAPARE - Transportation    Lack of Transportation (Medical): No    Lack of Transportation (Non-Medical): No  Physical Activity: Not on file  Stress: Not on file  Social Connections: Not on file  Intimate Partner Violence: Not on file   Family History  Problem Relation Age of Onset   Hypertension Mother    Heart failure Mother    Diabetes Mother    Heart attack Brother    Heart attack Maternal Aunt    Vitals:   09/23/22 1157  BP: 90/60  Pulse: 66  SpO2: 95%  Weight: 76 kg (167 lb 9.6 oz)    Wt Readings from Last 3 Encounters:  09/23/22 76 kg (167 lb 9.6 oz)  09/09/22 76.8 kg (169 lb 6.4 oz)  03/18/22 76.7 kg (169 lb 3.2 oz)   PHYSICAL EXAM: General: Elderly. No resp difficulty HEENT: normal Neck: supple. no JVD. Carotids 2+ bilat; no bruits. No lymphadenopathy or thryomegaly appreciated. Cor: PMI nondisplaced. Irregular rate & rhythm. No rubs, gallops or murmurs. Lungs: clear Abdomen: soft, nontender, nondistended. No hepatosplenomegaly. No bruits or masses. Good bowel sounds. Extremities: no cyanosis, clubbing, rash, edema Neuro: alert & orientedx3, cranial nerves grossly intact. moves all 4 extremities w/o difficulty. Affect pleasant  ASSESSMENT & PLAN:  1. Severe 3V CAD: - s/p CABGx 4 + AVR (LIMA-LAD, sequential SVG-OM1 and OM2, SVG- PDA) in 11/21 - Continue ASA 81 mg daily. - No s/s angina Not on b-blokcer due to fatigue - Refuses statin   2. Aortic Stenosis, s/p tissue AVR (05/23/20)                       - AV prosthesis ok on echo. -  aware of SBE prophylaxis   3.Chronic Biventricular Heart Failure:  - Echo in 2014 showed normal LVEF 55-60% w/ G1DD.  - Echo 10/21 and EF 30-35% w/ global HK. RV systolic function mildly reduced. - LHC this admit w/ severe 3V CAD>>ICM. Now s/p CABG. Also w/ severe AS, possibly   contributing to LV dysfunction, now s/p SAVR.  - Post-op echo 12/1 EF 30-35%, RV mildly reduced. AV prothesis ok. - Portable echo (1/22): EF 30-35%  - 5/22 ECHO EF 35-40%, mild-mod MR, Moderately reduced RV function  - Echo 9/23 EF 40-45%, RV mod reduced, mod MR, mild-mod TR, trivial AR, normal structure and function of the aortic valve prosthesis - About the same since last visit, NYHA III. Mostly due to fatigue  - Volume status stable today.  - Stop losartan with low BP today - Off digoxin with heart block.  - No beta blocker with AV block. - Not good SGLT2i candidate, self caths  - Not on spiro with low BP, SBP 90-100s at home - Labs today    4. Atrial Fibrillation, chronic  - In a fib, rate controlled - Rate controlled - Continue Eliquis 5 mg bid.  - No bleeding - We discussed about whether or not this is contributing to his fatigue. Given severity of fatigue, I think it is worth a trial of DC-CV  5. CKD IIIa - check labs   6. Urinary Retention - self caths 3x daily  7. Snoring/fatigue - Sleep study 10/23 AHI 13 Decided against CPAP   [name] Gerst, MD  12:30 PM     

## 2022-09-23 NOTE — Progress Notes (Signed)
Advanced Heart Failure Clinic Note   PCP: Cari Caraway, MD PCP-Cardiologist: Peter Martinique, MD  HF Cardiologist: Dr. Haroldine Laws  HPI:  [name] [name] is a 89 y/o male, previously very active despite age before recent illness. Had been working 20-30 hr/week for [employer] and [employer].    Admitted in 11/21 with progressive decline in EF to 30-35% w/ global HK. RV systolic function mildly reduced. Also noted to have mod-severe AS.  Had subsequent R/LHC showing severe 3V CAD. C   Underwent CABG x 4 (LIMA-LAD, sequential SVG-OM1 and OM2, SVG- PDA) + tissue AVR by Dr. Cyndia Bent on 05/23/20. Post operative course c/b atrial fibrillation treated w/ amiodarone + difficulty weaning inotropes, fluid overload and hypotension. AHF consult to assist with management. He was discharged to [facility] 06/26/20. His discharge weight was 168 lbs.  Sleep study 10/23 AHI 13 Decided against CPAP  Here today with his wife for f/u. Feels better. More energy on some day but other days just feels bogged down. Dr. Addison Lank referred him for PT. Able to do ADLs and get around the first floor of his house. Walking laps around the dining room table when he ffels ok  No CP, edema, orthopnea or PND.   Echo 9/23 EF 40-45%, RV mod reduced, mod MR, mild-mod TR, trivial AR, normal structure and function of the aortic valve prosthesis  ROS: All systems reviewed and negative except as per HPI.   Past Medical History:  Diagnosis Date   Aortic stenosis    Arthritis    CAD (coronary artery disease)    CHF (congestive heart failure) (HCC)    Colitis    Edema    Essential hypertension    no med now   Essential tremor    Hyperlipidemia    Hypotonic bladder    L4-L5   Obstructive uropathy    Peripheral neuropathy    Elevated by Dr. love 2010   Prostate cancer [facility]) 2005    Current Outpatient Medications  Medication Sig Dispense Refill   apixaban (ELIQUIS) 5 MG TABS tablet Take 1 tablet (5 mg total) by mouth 2  (two) times daily. 180 tablet 3   Ascorbic Acid (VITAMIN C) 100 MG tablet Take 100 mg by mouth daily.     aspirin 81 MG tablet Take 81 mg by mouth at bedtime.      Cholecalciferol (VITAMIN D) 50 MCG (2000 UT) tablet Take 1 tablet by mouth daily.     docusate sodium (COLACE) 100 MG capsule Take 100 mg by mouth daily as needed for mild constipation.     dutasteride (AVODART) 0.5 MG capsule Take 0.5 mg by mouth daily.     feeding supplement, ENSURE COMPLETE, (ENSURE COMPLETE) LIQD Take 237 mLs by mouth daily.     ferrous [id number] C-folic acid (TRINSICON / FOLTRIN) capsule Take 1 capsule by mouth 2 (two) times daily after a meal.     furosemide (LASIX) 40 MG tablet Take 40 mg by mouth daily.     losartan (COZAAR) 25 MG tablet Take 0.5 tablets (12.5 mg total) by mouth at bedtime. 45 tablet 3   melatonin 5 MG TABS Take 1 tablet (5 mg total) by mouth at bedtime as needed (sleep).  0   Multiple Vitamins-Minerals (CENTRUM SILVER PO) Take 1 tablet by mouth daily.      sulfaSALAzine (AZULFIDINE) 500 MG tablet Take 2,000 mg by mouth daily.     tamsulosin (FLOMAX) 0.4 MG CAPS Take 0.4 mg by mouth 2 (two) times  daily.      vitamin B-12 (CYANOCOBALAMIN) 100 MCG tablet Take 1 tablet by mouth daily.     No current facility-administered medications for this encounter.   Allergies  Allergen Reactions   Simvastatin Other (See Comments)    "caused neuropathy pain"    Social History   Socioeconomic History   Marital status: Married    Spouse name: Not on file   Number of children: Not on file   Years of education: 18   Highest education level: Master's degree (e.g., MA, MS, MEng, MEd, MSW, MBA)  Occupational History   Not on file  Tobacco Use   Smoking status: Never   Smokeless tobacco: Never  Vaping Use   Vaping Use: Never used  Substance and Sexual Activity   Alcohol use: Yes    Alcohol/week: 1.0 standard drink of alcohol    Types: 1 Glasses of wine per week    Comment: occ   Drug  use: No   Sexual activity: Yes  Other Topics Concern   Not on file  Social History Narrative   Not on file   Social Determinants of Health   Financial Resource Strain: Not on file  Food Insecurity: No Food Insecurity (07/30/2020)   Hunger Vital Sign    Worried About Running Out of Food in the Last Year: Never true    Ran Out of Food in the Last Year: Never true  Transportation Needs: No Transportation Needs (07/30/2020)   PRAPARE - Hydrologist (Medical): No    Lack of Transportation (Non-Medical): No  Physical Activity: Not on file  Stress: Not on file  Social Connections: Not on file  Intimate Partner Violence: Not on file   Family History  Problem Relation Age of Onset   Hypertension Mother    Heart failure Mother    Diabetes Mother    Heart attack Brother    Heart attack Maternal Aunt    Vitals:   09/23/22 1157  BP: 90/60  Pulse: 66  SpO2: 95%  Weight: 76 kg (167 lb 9.6 oz)    Wt Readings from Last 3 Encounters:  09/23/22 76 kg (167 lb 9.6 oz)  09/09/22 76.8 kg (169 lb 6.4 oz)  03/18/22 76.7 kg (169 lb 3.2 oz)   PHYSICAL EXAM: General: Elderly. No resp difficulty HEENT: normal Neck: supple. no JVD. Carotids 2+ bilat; no bruits. No lymphadenopathy or thryomegaly appreciated. Cor: PMI nondisplaced. Irregular rate & rhythm. No rubs, gallops or murmurs. Lungs: clear Abdomen: soft, nontender, nondistended. No hepatosplenomegaly. No bruits or masses. Good bowel sounds. Extremities: no cyanosis, clubbing, rash, edema Neuro: alert & orientedx3, cranial nerves grossly intact. moves all 4 extremities w/o difficulty. Affect pleasant  ASSESSMENT & PLAN:  1. Severe 3V CAD: - s/p CABGx 4 + AVR (LIMA-LAD, sequential SVG-OM1 and OM2, SVG- PDA) in 11/21 - Continue ASA 81 mg daily. - No s/s angina Not on b-blokcer due to fatigue - Refuses statin   2. Aortic Stenosis, s/p tissue AVR (05/23/20)                       - AV prosthesis ok on echo. -  aware of SBE prophylaxis   3.Chronic Biventricular Heart Failure:  - Echo in 2014 showed normal LVEF 55-60% w/ G1DD.  - Echo 10/21 and EF 30-35% w/ global HK. RV systolic function mildly reduced. - LHC this admit w/ severe 3V CAD>>ICM. Now s/p CABG. Also w/ severe AS, possibly  contributing to LV dysfunction, now s/p SAVR.  - Post-op echo 12/1 EF 30-35%, RV mildly reduced. AV prothesis ok. - Portable echo (1/22): EF 30-35%  - 5/22 ECHO EF 35-40%, mild-mod MR, Moderately reduced RV function  - Echo 9/23 EF 40-45%, RV mod reduced, mod MR, mild-mod TR, trivial AR, normal structure and function of the aortic valve prosthesis - About the same since last visit, NYHA III. Mostly due to fatigue  - Volume status stable today.  - Stop losartan with low BP today - Off digoxin with heart block.  - No beta blocker with AV block. - Not good SGLT2i candidate, self caths  - Not on spiro with low BP, SBP 90-100s at home - Labs today    4. Atrial Fibrillation, chronic  - In a fib, rate controlled - Rate controlled - Continue Eliquis 5 mg bid.  - No bleeding - We discussed about whether or not this is contributing to his fatigue. Given severity of fatigue, I think it is worth a trial of DC-CV  5. CKD IIIa - check labs   6. Urinary Retention - self caths 3x daily  7. Snoring/fatigue - Sleep study 10/23 AHI 13 Decided against CPAP   Glori Bickers, MD  12:30 PM

## 2022-09-24 ENCOUNTER — Other Ambulatory Visit (HOSPITAL_COMMUNITY): Payer: Self-pay

## 2022-09-24 DIAGNOSIS — M6281 Muscle weakness (generalized): Secondary | ICD-10-CM | POA: Diagnosis not present

## 2022-09-24 DIAGNOSIS — R262 Difficulty in walking, not elsewhere classified: Secondary | ICD-10-CM | POA: Diagnosis not present

## 2022-09-24 DIAGNOSIS — I480 Paroxysmal atrial fibrillation: Secondary | ICD-10-CM

## 2022-09-30 DIAGNOSIS — R262 Difficulty in walking, not elsewhere classified: Secondary | ICD-10-CM | POA: Diagnosis not present

## 2022-09-30 DIAGNOSIS — M6281 Muscle weakness (generalized): Secondary | ICD-10-CM | POA: Diagnosis not present

## 2022-10-04 IMAGING — DX DG CHEST 1V PORT
1 series · 1 of 1 positions shown · non-contrast
Comparison: Chest radiograph dated 05/22/2020.

CLINICAL DATA: [AGE] male status post CABG.

EXAM:
PORTABLE CHEST 1 VIEW

[chest ap]
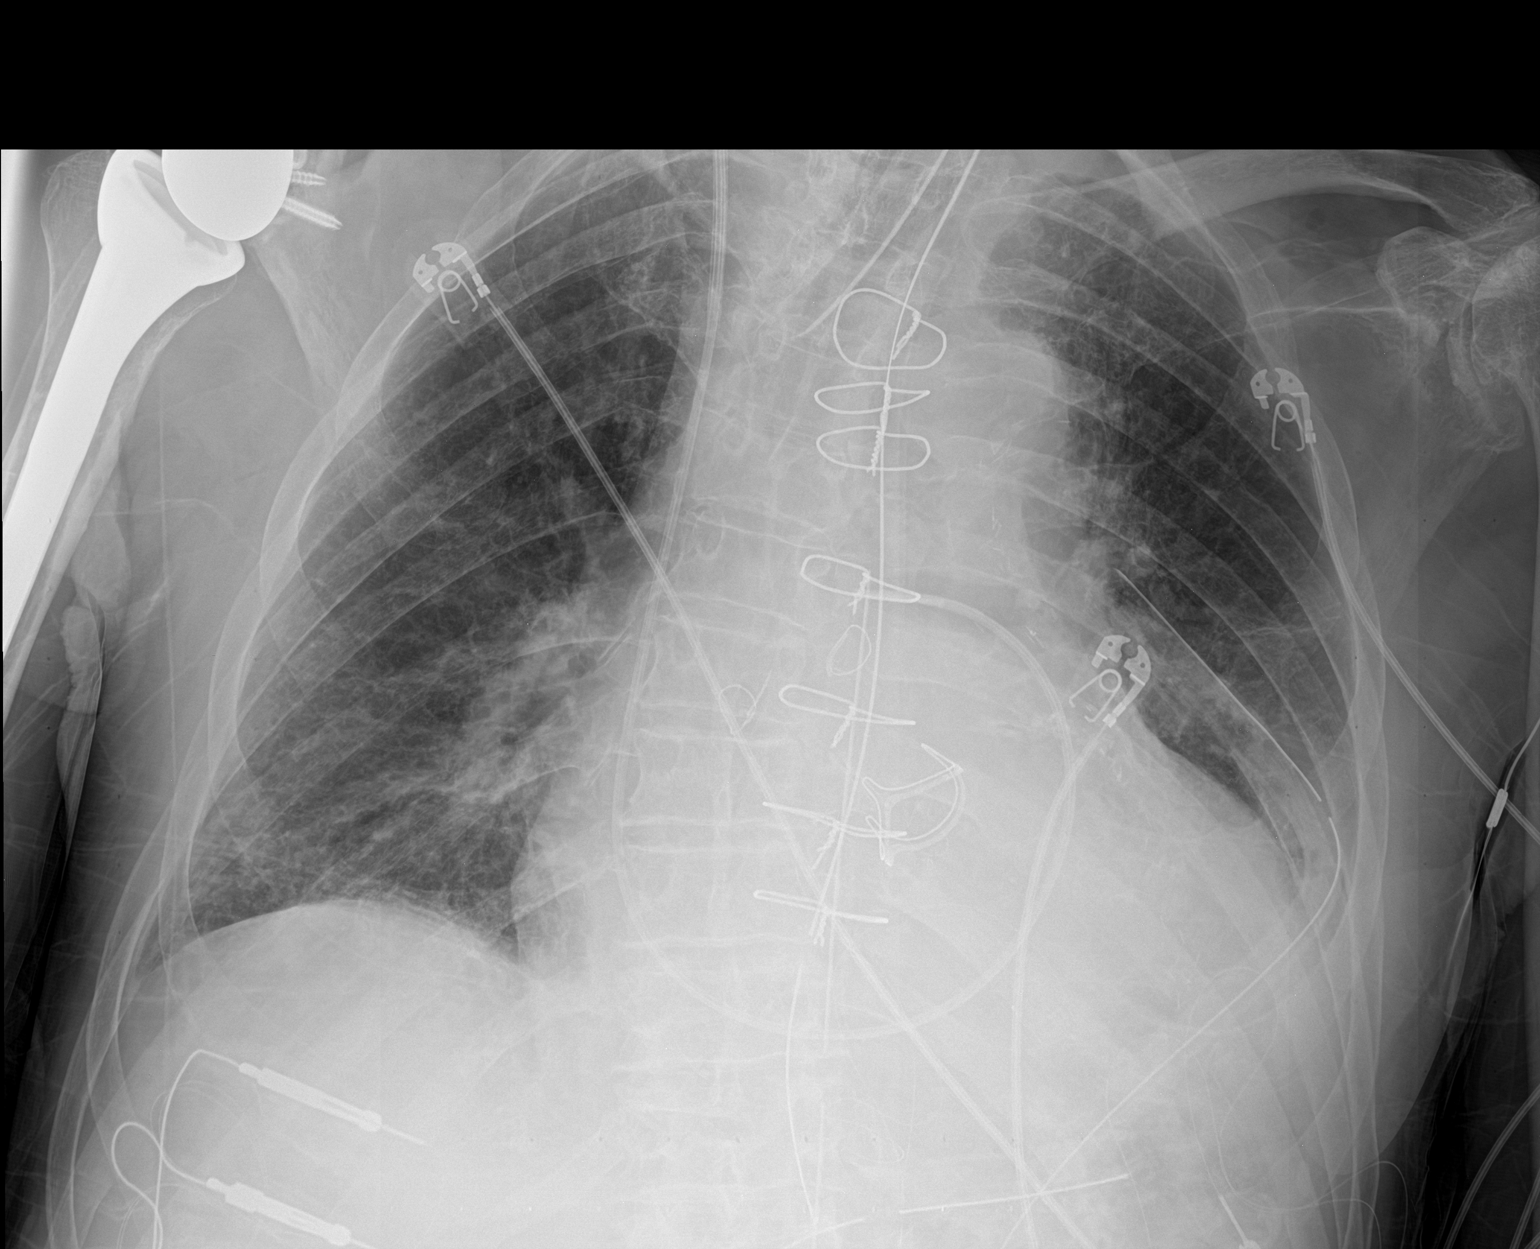

[1 of 1 positions shown; findings below may reference images not displayed]

FINDINGS: Endotracheal tube with tip approximately 4 cm above the carina.
Enteric tube extends below the diaphragm with side-port just above
the GE junction and tip beyond the inferior margin of the image.
Recommend further advancing of the tube by additional 7 cm. Right IJ
Soares Dias with tip over mediastinum tilting towards the right main
pulmonary artery. Left chest tube with tip over the left hilum.
Inferiorly accessed mediastinal drain.

Left lung base densities, likely atelectasis. A small left pleural
effusion is suspected. No pneumothorax. Background of emphysema and
chronic bronchitic changes. There is median sternotomy wires and
CABG vascular clips. Mechanical cardiac valve. No acute osseous
pathology. Right shoulder arthroplasty.
IMPRESSION: 1. Postsurgical changes of CABG and cardiac valve replacement.
2. Endotracheal tube above the carina. Enteric tube extends below
the diaphragm with side-port just above the GE junction. Recommend
further advancing of the tube by additional 7 cm.
3. Left lung base atelectasis and probable trace pleural effusion.

## 2022-10-05 ENCOUNTER — Telehealth (HOSPITAL_COMMUNITY): Payer: Self-pay

## 2022-10-05 NOTE — Telephone Encounter (Signed)
Patient's wife about cardioversion scheduled for 10/07/22. She would rather not put her husband through procedure if not truly indicated. They did not remember speaking about it and would like to know if it is needed.

## 2022-10-06 ENCOUNTER — Telehealth (HOSPITAL_COMMUNITY): Payer: Self-pay

## 2022-10-06 DIAGNOSIS — C61 Malignant neoplasm of prostate: Secondary | ICD-10-CM | POA: Diagnosis not present

## 2022-10-06 IMAGING — DX DG CHEST 1V PORT
1 series · 1 of 1 positions shown · non-contrast
Comparison: 05/24/2020

CLINICAL DATA: Status post open heart surgery.

EXAM:
PORTABLE CHEST 1 VIEW

[chest]
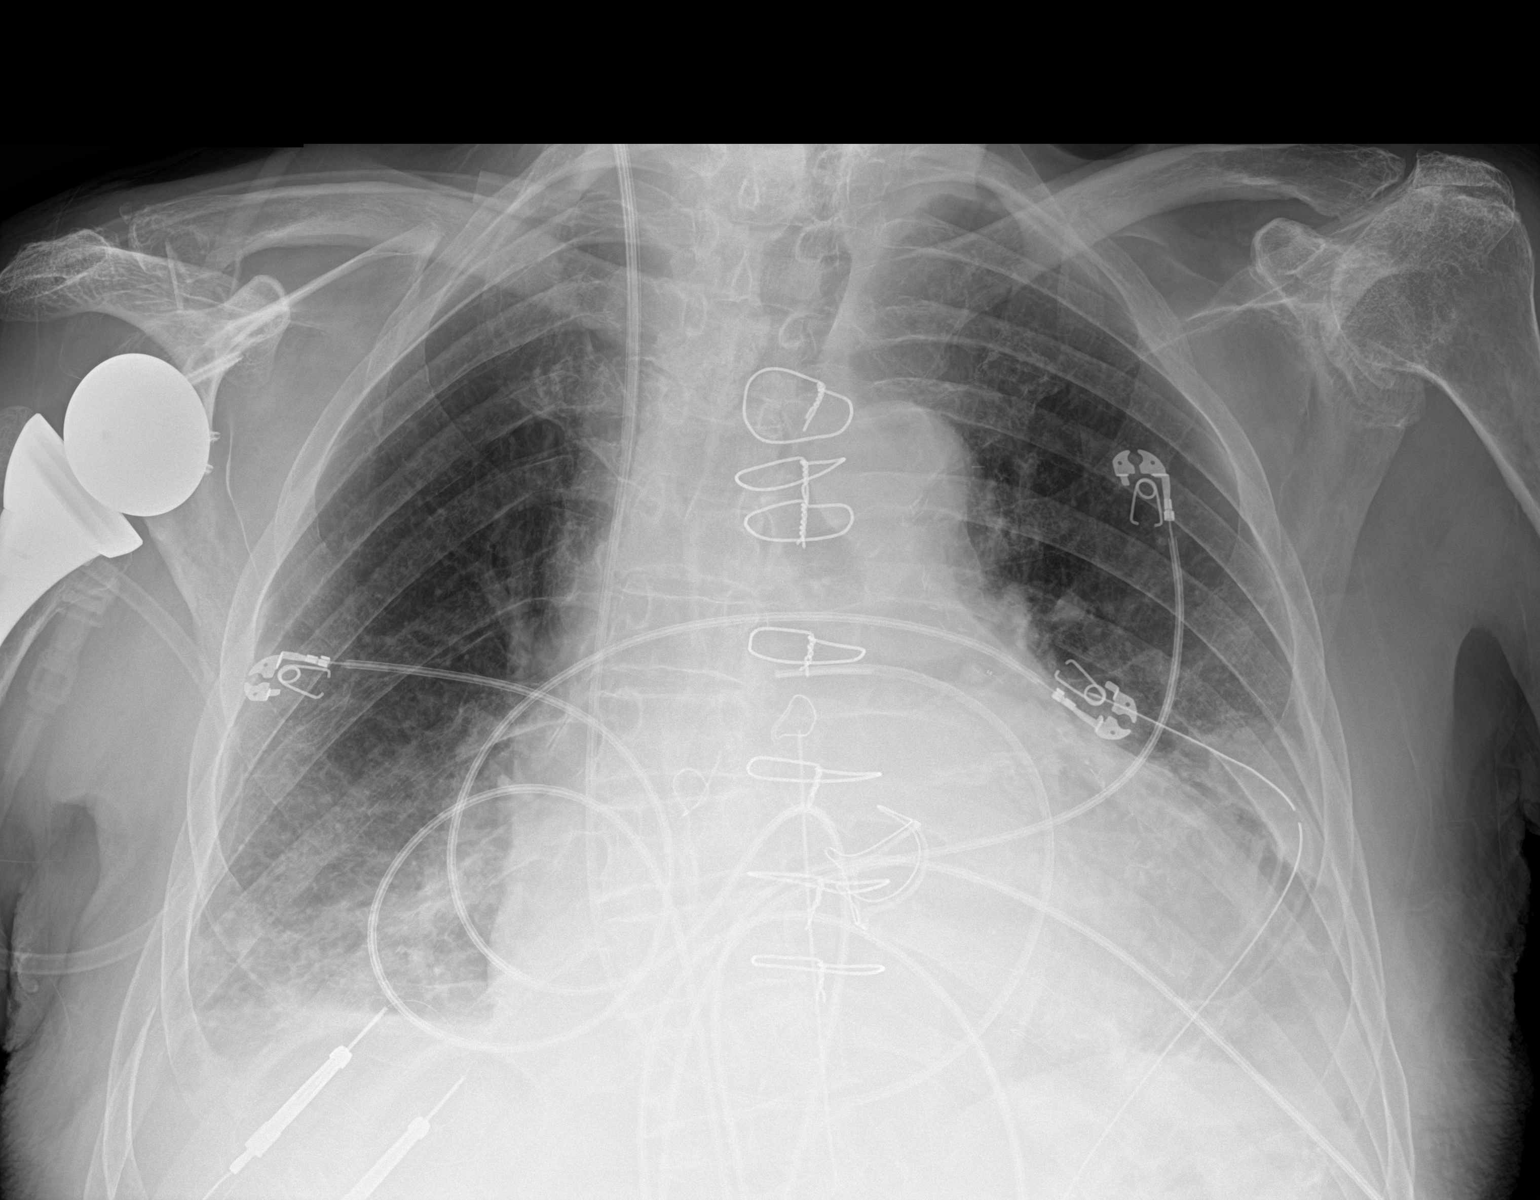

[1 of 1 positions shown; findings below may reference images not displayed]

FINDINGS: The Swan-Ganz catheter tip is in the proximal right pulmonary
artery. Mediastinal drain and left chest tube in place. No
pneumothorax identified. Cardiac enlargement. Small bilateral
pleural effusions with bibasilar atelectasis. Upper lung zones are
clear. Previous right shoulder arthroplasty. Advanced degenerative
changes are noted involving the left glenohumeral joint.
IMPRESSION: 1. Left chest tube and mediastinal drain in place.  No pneumothorax.
2. Small bilateral pleural effusions and bibasilar atelectasis.

## 2022-10-06 NOTE — Telephone Encounter (Signed)
Hey there was another message from them on triage line from last night. Let me know if you need anything.

## 2022-10-06 NOTE — Pre-Procedure Instructions (Signed)
Attempted to call patient regarding procedure instructions for tomorrow.   Left voicemail on the following items.  Arrival time 8:30. Nothing to eat or drink after midnight. You need a responsible adult to try your home and stay with your for 24 hours You are on Eliquis- if you have missed any doses please let office know.

## 2022-10-06 NOTE — Telephone Encounter (Signed)
Left message for patient's wife to call office back about cardioversion scheduled for tomorrow.

## 2022-10-06 NOTE — Telephone Encounter (Signed)
Called patient's wife about cardioversion scheduled for tomorrow.

## 2022-10-06 NOTE — Telephone Encounter (Signed)
Patient's wife called back about cardioversion scheduled for tomorrow. They want to proceed with procedure. Aware of nothing to eat or drink after midnight,Continue taking Eliquis and holding Lasix morning of procedure. Has transportation to and from procedure.

## 2022-10-07 ENCOUNTER — Ambulatory Visit (HOSPITAL_COMMUNITY)
Admission: RE | Admit: 2022-10-07 | Discharge: 2022-10-07 | Disposition: A | Discharge status: Home / Self Care | Payer: Medicare HMO | Attending: Internal Medicine | Admitting: Internal Medicine

## 2022-10-07 ENCOUNTER — Ambulatory Visit (HOSPITAL_BASED_OUTPATIENT_CLINIC_OR_DEPARTMENT_OTHER): Payer: Medicare HMO | Admitting: Anesthesiology

## 2022-10-07 ENCOUNTER — Encounter (HOSPITAL_COMMUNITY)
Admission: RE | Disposition: A | Discharge status: Home / Self Care | Payer: Self-pay | Source: Home / Self Care | Attending: Internal Medicine

## 2022-10-07 ENCOUNTER — Ambulatory Visit (HOSPITAL_COMMUNITY): Payer: Medicare HMO | Admitting: Anesthesiology

## 2022-10-07 ENCOUNTER — Other Ambulatory Visit: Payer: Self-pay

## 2022-10-07 DIAGNOSIS — I080 Rheumatic disorders of both mitral and aortic valves: Secondary | ICD-10-CM

## 2022-10-07 DIAGNOSIS — I350 Nonrheumatic aortic (valve) stenosis: Secondary | ICD-10-CM | POA: Diagnosis not present

## 2022-10-07 DIAGNOSIS — Z7982 Long term (current) use of aspirin: Secondary | ICD-10-CM | POA: Insufficient documentation

## 2022-10-07 DIAGNOSIS — I2510 Atherosclerotic heart disease of native coronary artery without angina pectoris: Secondary | ICD-10-CM | POA: Insufficient documentation

## 2022-10-07 DIAGNOSIS — R339 Retention of urine, unspecified: Secondary | ICD-10-CM | POA: Diagnosis not present

## 2022-10-07 DIAGNOSIS — Z951 Presence of aortocoronary bypass graft: Secondary | ICD-10-CM | POA: Insufficient documentation

## 2022-10-07 DIAGNOSIS — R5383 Other fatigue: Secondary | ICD-10-CM | POA: Diagnosis not present

## 2022-10-07 DIAGNOSIS — Z7901 Long term (current) use of anticoagulants: Secondary | ICD-10-CM | POA: Diagnosis not present

## 2022-10-07 DIAGNOSIS — I4891 Unspecified atrial fibrillation: Secondary | ICD-10-CM | POA: Diagnosis not present

## 2022-10-07 DIAGNOSIS — N1831 Chronic kidney disease, stage 3a: Secondary | ICD-10-CM | POA: Insufficient documentation

## 2022-10-07 DIAGNOSIS — I509 Heart failure, unspecified: Secondary | ICD-10-CM

## 2022-10-07 DIAGNOSIS — I110 Hypertensive heart disease with heart failure: Secondary | ICD-10-CM

## 2022-10-07 DIAGNOSIS — I130 Hypertensive heart and chronic kidney disease with heart failure and stage 1 through stage 4 chronic kidney disease, or unspecified chronic kidney disease: Secondary | ICD-10-CM | POA: Insufficient documentation

## 2022-10-07 DIAGNOSIS — R0683 Snoring: Secondary | ICD-10-CM | POA: Diagnosis not present

## 2022-10-07 DIAGNOSIS — Z953 Presence of xenogenic heart valve: Secondary | ICD-10-CM | POA: Diagnosis not present

## 2022-10-07 DIAGNOSIS — I4819 Other persistent atrial fibrillation: Secondary | ICD-10-CM

## 2022-10-07 DIAGNOSIS — I482 Chronic atrial fibrillation, unspecified: Secondary | ICD-10-CM | POA: Insufficient documentation

## 2022-10-07 HISTORY — PX: CARDIOVERSION: SHX1299

## 2022-10-07 IMAGING — DX DG CHEST 1V PORT
1 series · 1 of 1 positions shown · non-contrast
Comparison: May 25, 2020.

CLINICAL DATA: [id number].  Chest tube.

EXAM:
PORTABLE CHEST 1 VIEW

[chest ap]
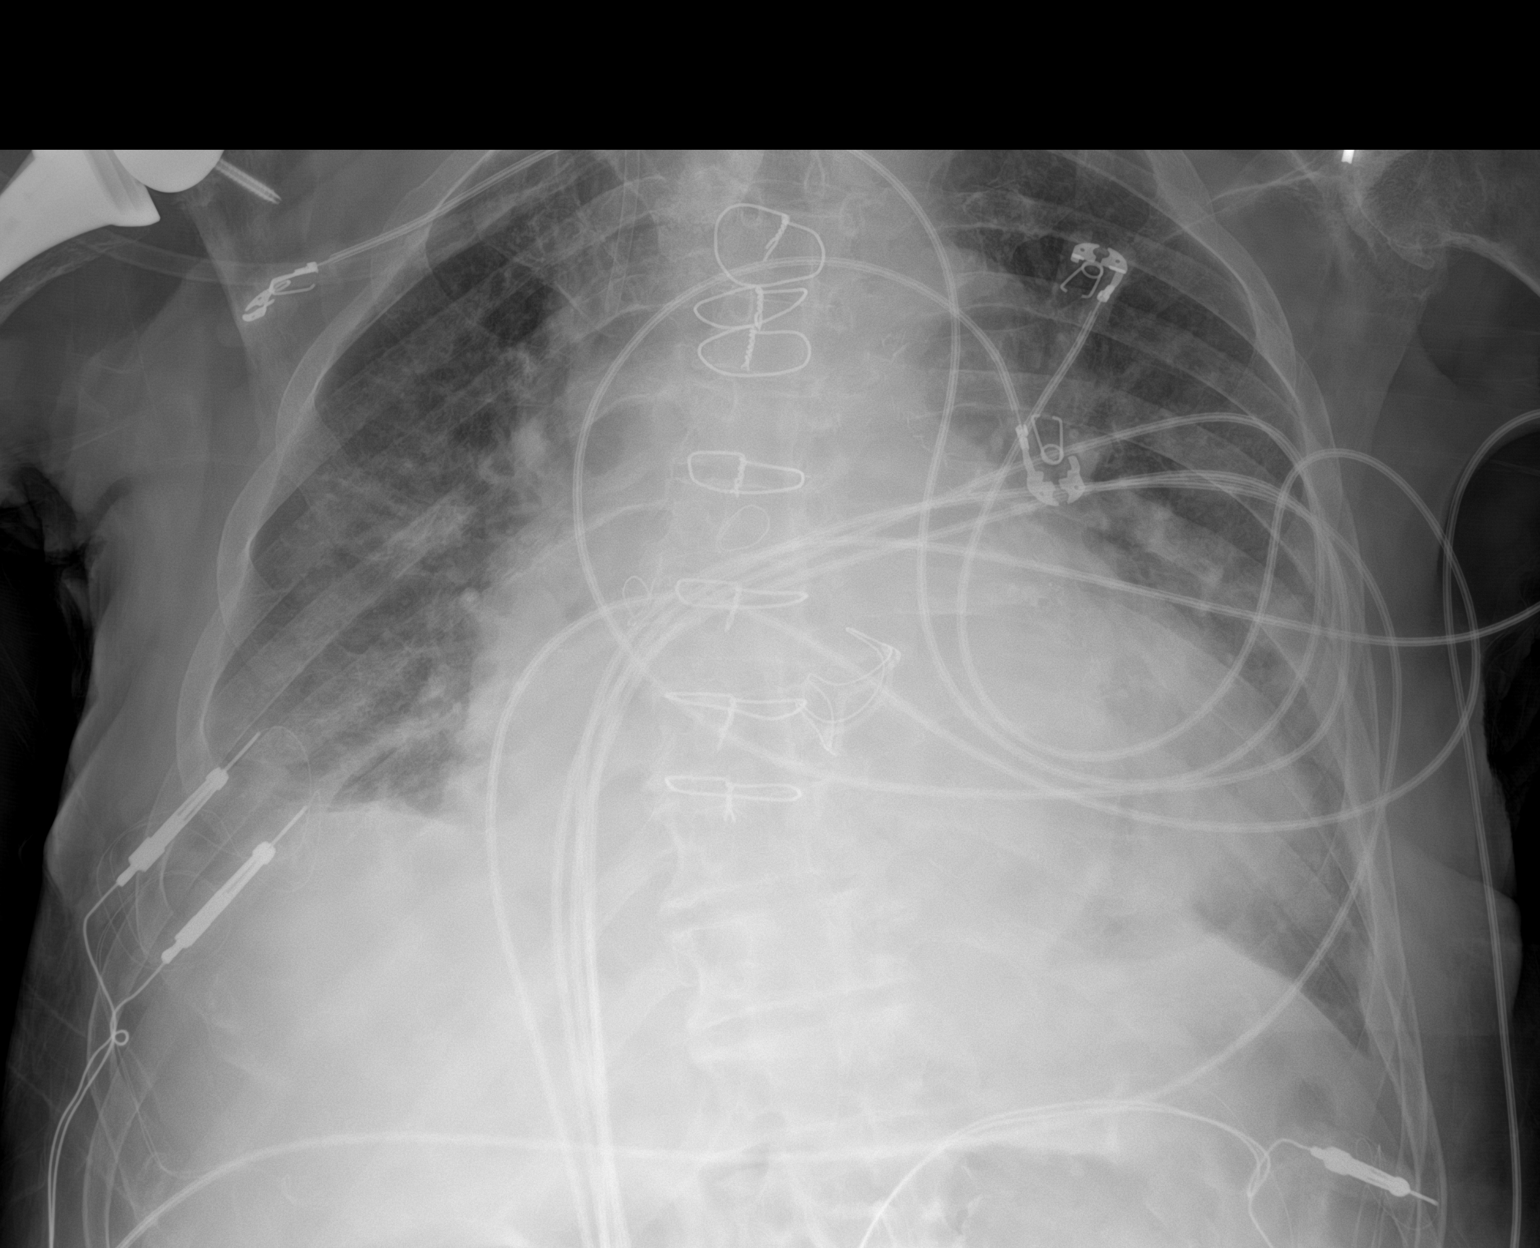

[1 of 1 positions shown; findings below may reference images not displayed]

FINDINGS: Stable cardiomegaly. Status post coronary artery bypass graft and
aortic valve repair. Swan-Ganz catheter has been removed. Mildly
increased interstitial lung opacities are noted concerning for edema
or possibly atypical pneumonia. Small pleural effusions may be
present. No definite pneumothorax is noted. Status post right total
shoulder arthroplasty.
IMPRESSION: Status post coronary artery bypass graft and aortic valve repair.
Mildly increased interstitial lung opacities are noted concerning
for edema or possibly atypical pneumonia. Small pleural effusions
may be present.

## 2022-10-07 SURGERY — CARDIOVERSION
Anesthesia: General

## 2022-10-07 MED ORDER — PROPOFOL 10 MG/ML IV BOLUS
INTRAVENOUS | Status: DC | PRN
  Administered 2022-10-07: 40 mg via INTRAVENOUS

## 2022-10-07 MED ORDER — LIDOCAINE HCL (CARDIAC) PF 100 MG/5ML IV SOSY
PREFILLED_SYRINGE | INTRAVENOUS | Status: DC | PRN
  Administered 2022-10-07: 50 mg via INTRAVENOUS

## 2022-10-07 MED ORDER — SODIUM CHLORIDE 0.9 % IV SOLN: INTRAVENOUS | Status: DC | PRN

## 2022-10-07 SURGICAL SUPPLY — 1 items: ELECT DEFIB PAD ADLT CADENCE (PAD) ×1 IMPLANT

## 2022-10-07 NOTE — Interval H&P Note (Signed)
History and Physical Interval Note:  10/07/2022 8:43 AM  [name] [name]  has presented today for surgery, with the diagnosis of AFIB.  The various methods of treatment have been discussed with the patient and family. After consideration of risks, benefits and other options for treatment, the patient has consented to  Procedure(s): CARDIOVERSION (N/A) as a surgical intervention.  The patient's history has been reviewed, patient examined, no change in status, stable for surgery.  I have reviewed the patient's chart and labs.  Questions were answered to the patient's satisfaction.     [name]

## 2022-10-07 NOTE — Anesthesia Preprocedure Evaluation (Addendum)
Anesthesia Evaluation  Patient identified by MRN, date of birth, ID band Patient awake    Reviewed: Allergy & Precautions, NPO status , Patient's Chart, lab work & pertinent test results  Airway Mallampati: II  TM Distance: >3 FB Neck ROM: Full    Dental no notable dental hx. (+) Dental Advisory Given, Teeth Intact   Pulmonary shortness of breath, pneumonia   Pulmonary exam normal breath sounds clear to auscultation       Cardiovascular hypertension, Pt. on medications + CAD and +CHF  Normal cardiovascular exam+ Valvular Problems/Murmurs (Mild mod TR) AS and MR  Rhythm:Regular Rate:Normal  Echo 02/2022  1. Left ventricular ejection fraction, by estimation, is 40 to 45%. The  left ventricle has mildly decreased function. The left ventricle  demonstrates regional wall motion abnormalities (see scoring  diagram/findings for description). There is severe  asymmetric left ventricular hypertrophy of the basal-septal segment. Left  ventricular diastolic parameters are indeterminate. There is akinesis of  the left ventricular, entire inferior wall and inferolateral wall.   2. Right ventricular systolic function is moderately reduced. The right  ventricular size is mildly enlarged. There is normal pulmonary artery  systolic pressure.   3. Left atrial size was severely dilated.   4. Right atrial size was severely dilated.   5. The mitral valve is degenerative. Moderate mitral valve regurgitation.  No evidence of mitral stenosis.   6. Tricuspid valve regurgitation is mild to moderate.   7. The aortic valve has been repaired/replaced. Aortic valve  regurgitation is trivial. No aortic stenosis is present. There is a 29 mm  bioprosthetic valve present in the aortic position. Procedure Date:  05/23/2020. Echo findings are consistent with  normal structure and function of the aortic valve prosthesis. Aortic valve  area, by VTI measures 4.62  cm. Aortic valve mean gradient measures 5.0  mmHg. Aortic valve Vmax measures 1.42 m/s.   8. The inferior vena cava is normal in size with greater than 50%  respiratory variability, suggesting right atrial pressure of 3 mmHg.     Neuro/Psych  Neuromuscular disease    GI/Hepatic   Endo/Other    Renal/GU Renal disease     Musculoskeletal  (+) Arthritis ,    Abdominal   Peds  Hematology   Anesthesia Other Findings   Reproductive/Obstetrics                             Anesthesia Physical Anesthesia Plan  ASA: 3  Anesthesia Plan: General   Post-op Pain Management: Minimal or no pain anticipated   Induction: Intravenous  PONV Risk Score and Plan: 2 and Treatment may vary due to age or medical condition, Propofol infusion and TIVA  Airway Management Planned: Mask  Additional Equipment:   Intra-op Plan:   Post-operative Plan:   Informed Consent: I have reviewed the patients History and Physical, chart, labs and discussed the procedure including the risks, benefits and alternatives for the proposed anesthesia with the patient or authorized representative who has indicated his/her understanding and acceptance.     Dental advisory given  Plan Discussed with: CRNA  Anesthesia Plan Comments:         Anesthesia Quick Evaluation

## 2022-10-07 NOTE — CV Procedure (Signed)
    DIRECT CURRENT CARDIOVERSION  NAME:  [name] [name]   MRN: [id number] DOB:  08/23/1927   ADMIT DATE: 10/07/2022   INDICATIONS: Atrial fibrillation    PROCEDURE:   Informed consent was obtained prior to the procedure. The risks, benefits and alternatives for the procedure were discussed and the patient comprehended these risks. Once an appropriate time out was taken, the patient had the defibrillator pads placed in the anterior and posterior position. The patient then underwent sedation by the anesthesia service. Once an appropriate level of sedation was achieved, the patient received a single biphasic, synchronized 150J shock with conversion to sinus rhythm with marked 1 AVB with episodes of 2nd degree (type I HB). No apparent complications.  Arvilla Meres, MD  9:24 AM

## 2022-10-07 NOTE — Transfer of Care (Signed)
Immediate Anesthesia Transfer of Care Note  Patient: [name] [name]  Procedure(s) Performed: CARDIOVERSION  Patient Location: PACU  Anesthesia Type:MAC  Level of Consciousness: awake  Airway & Oxygen Therapy: Patient Spontanous Breathing  Post-op Assessment: Report given to RN and Post -op Vital signs reviewed and stable  Post vital signs: Reviewed and stable  Last Vitals:  Vitals Value Taken Time  BP    Temp    Pulse    Resp    SpO2      Last Pain: There were no vitals filed for this visit.       Complications: No notable events documented.

## 2022-10-07 NOTE — Anesthesia Postprocedure Evaluation (Signed)
Anesthesia Post Note  Patient: [name] [name]  Procedure(s) Performed: CARDIOVERSION     Patient location during evaluation: Cath Lab Anesthesia Type: General Level of consciousness: sedated and patient cooperative Pain management: pain level controlled Vital Signs Assessment: post-procedure vital signs reviewed and stable Respiratory status: spontaneous breathing Cardiovascular status: stable Anesthetic complications: no   No notable events documented.  Last Vitals:  Vitals:   10/07/22 0950 10/07/22 0956  BP: 109/71 117/81  Pulse: (!) 46 78  Resp: 18 13  SpO2: 94% 96%    Last Pain: There were no vitals filed for this visit.               Lewie Loron

## 2022-10-08 ENCOUNTER — Encounter (HOSPITAL_COMMUNITY): Payer: Self-pay | Admitting: Internal Medicine

## 2022-10-13 DIAGNOSIS — C61 Malignant neoplasm of prostate: Secondary | ICD-10-CM | POA: Diagnosis not present

## 2022-10-14 DIAGNOSIS — R262 Difficulty in walking, not elsewhere classified: Secondary | ICD-10-CM | POA: Diagnosis not present

## 2022-10-14 DIAGNOSIS — M6281 Muscle weakness (generalized): Secondary | ICD-10-CM | POA: Diagnosis not present

## 2022-10-16 IMAGING — DX DG ABD PORTABLE 1V
1 series · 1 of 1 positions shown · non-contrast
Comparison: June 04, 2020

CLINICAL DATA: Feeding tube placement

EXAM:
PORTABLE ABDOMEN - 1 VIEW

[abdomen supine]
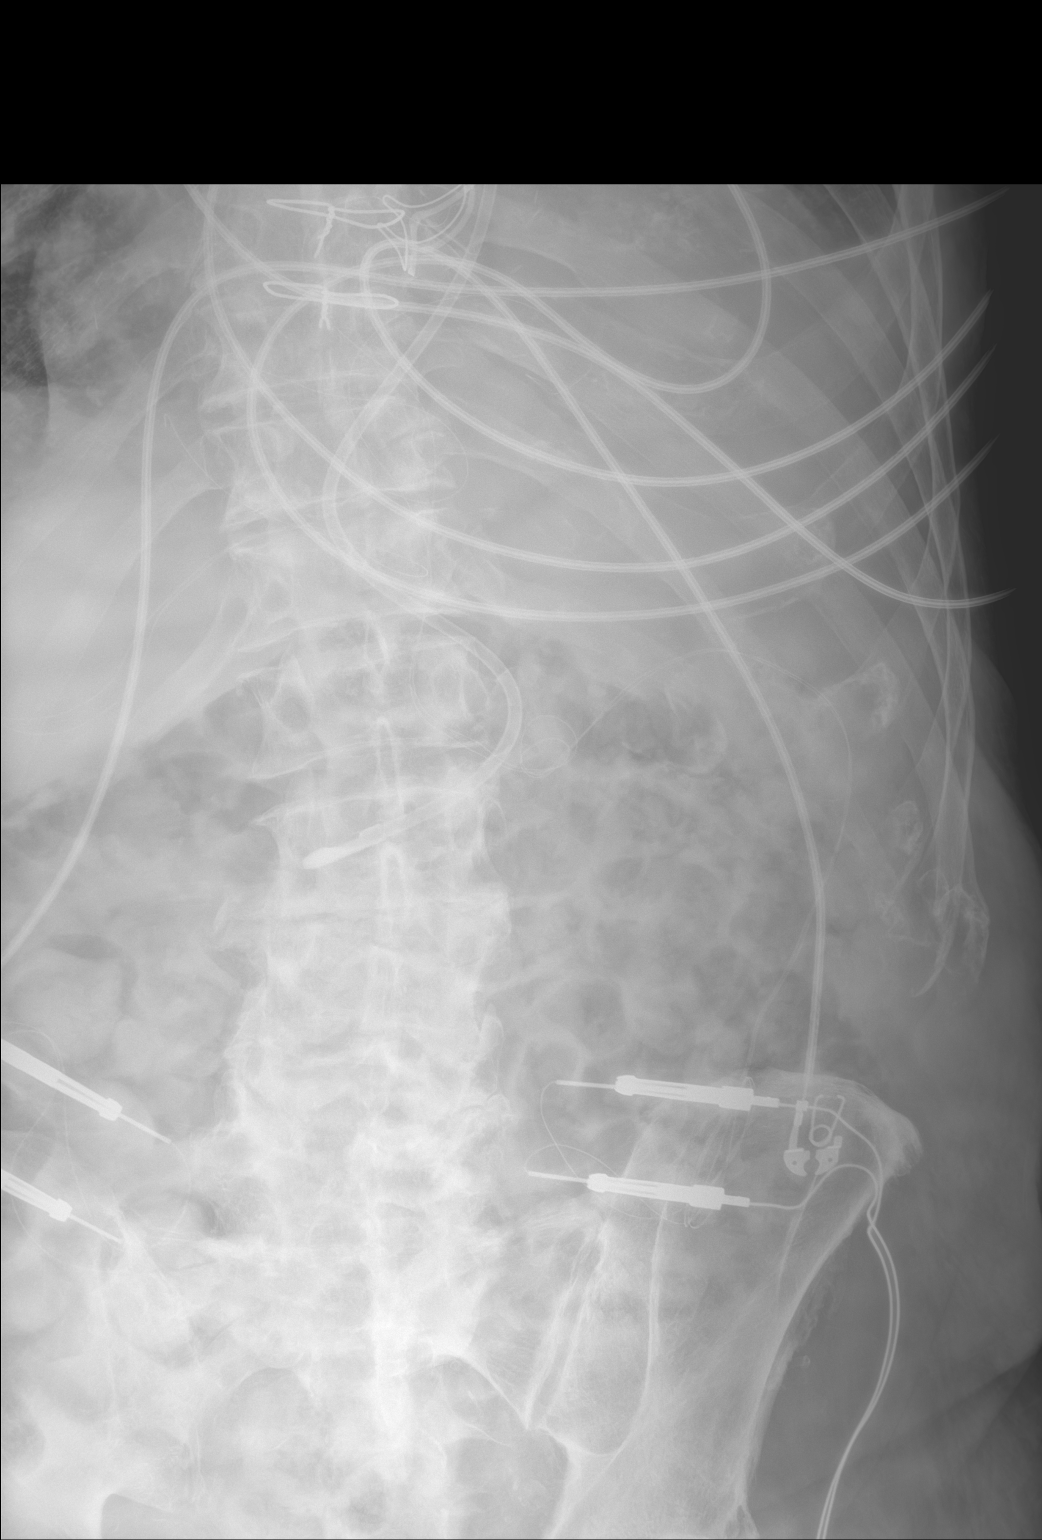

[1 of 1 positions shown; findings below may reference images not displayed]

FINDINGS: Signs of sternotomy and aortic valve replacement in the chest with
effusions and basilar airspace disease.

Leads project over the abdomen. Placement of a feeding tube, tip in
the gastric antrum/likely pre-pyloric.

Mild distension of gas-filled loops of bowel throughout the abdomen.

No acute skeletal process on limited assessment.
IMPRESSION: 1. Placement of a feeding tube, tip in the gastric antrum/likely
pre-pyloric.
2. Signs of effusions and basilar airspace disease.
3. Mild gas-filled loops of bowel throughout the abdomen. May
represent ileus. Correlate with abdominal symptoms with follow-up
radiographs as warranted.

## 2022-10-21 DIAGNOSIS — L570 Actinic keratosis: Secondary | ICD-10-CM | POA: Diagnosis not present

## 2022-10-21 DIAGNOSIS — D045 Carcinoma in situ of skin of trunk: Secondary | ICD-10-CM | POA: Diagnosis not present

## 2022-10-21 DIAGNOSIS — L578 Other skin changes due to chronic exposure to nonionizing radiation: Secondary | ICD-10-CM | POA: Diagnosis not present

## 2022-10-21 DIAGNOSIS — L814 Other melanin hyperpigmentation: Secondary | ICD-10-CM | POA: Diagnosis not present

## 2022-10-21 DIAGNOSIS — D485 Neoplasm of uncertain behavior of skin: Secondary | ICD-10-CM | POA: Diagnosis not present

## 2022-10-21 DIAGNOSIS — Z85828 Personal history of other malignant neoplasm of skin: Secondary | ICD-10-CM | POA: Diagnosis not present

## 2022-10-21 DIAGNOSIS — C44719 Basal cell carcinoma of skin of left lower limb, including hip: Secondary | ICD-10-CM | POA: Diagnosis not present

## 2022-10-21 DIAGNOSIS — C44622 Squamous cell carcinoma of skin of right upper limb, including shoulder: Secondary | ICD-10-CM | POA: Diagnosis not present

## 2022-10-21 DIAGNOSIS — B351 Tinea unguium: Secondary | ICD-10-CM | POA: Diagnosis not present

## 2022-10-21 DIAGNOSIS — L821 Other seborrheic keratosis: Secondary | ICD-10-CM | POA: Diagnosis not present

## 2022-10-21 DIAGNOSIS — C44529 Squamous cell carcinoma of skin of other part of trunk: Secondary | ICD-10-CM | POA: Diagnosis not present

## 2022-10-21 DIAGNOSIS — D225 Melanocytic nevi of trunk: Secondary | ICD-10-CM | POA: Diagnosis not present

## 2022-10-22 DIAGNOSIS — R262 Difficulty in walking, not elsewhere classified: Secondary | ICD-10-CM | POA: Diagnosis not present

## 2022-10-22 DIAGNOSIS — M6281 Muscle weakness (generalized): Secondary | ICD-10-CM | POA: Diagnosis not present

## 2022-10-23 ENCOUNTER — Encounter (HOSPITAL_BASED_OUTPATIENT_CLINIC_OR_DEPARTMENT_OTHER): Payer: Self-pay

## 2022-10-23 ENCOUNTER — Emergency Department (HOSPITAL_BASED_OUTPATIENT_CLINIC_OR_DEPARTMENT_OTHER)
Admission: EM | Admit: 2022-10-23 | Discharge: 2022-10-23 | Disposition: A | Discharge status: Home / Self Care | Payer: Medicare HMO | Attending: Emergency Medicine | Admitting: Emergency Medicine

## 2022-10-23 ENCOUNTER — Other Ambulatory Visit: Payer: Self-pay

## 2022-10-23 DIAGNOSIS — I2510 Atherosclerotic heart disease of native coronary artery without angina pectoris: Secondary | ICD-10-CM | POA: Diagnosis not present

## 2022-10-23 DIAGNOSIS — I110 Hypertensive heart disease with heart failure: Secondary | ICD-10-CM | POA: Diagnosis not present

## 2022-10-23 DIAGNOSIS — R600 Localized edema: Secondary | ICD-10-CM | POA: Insufficient documentation

## 2022-10-23 DIAGNOSIS — R0489 Hemorrhage from other sites in respiratory passages: Secondary | ICD-10-CM | POA: Insufficient documentation

## 2022-10-23 DIAGNOSIS — Z7982 Long term (current) use of aspirin: Secondary | ICD-10-CM | POA: Diagnosis not present

## 2022-10-23 DIAGNOSIS — Z79899 Other long term (current) drug therapy: Secondary | ICD-10-CM | POA: Diagnosis not present

## 2022-10-23 DIAGNOSIS — X58XXXA Exposure to other specified factors, initial encounter: Secondary | ICD-10-CM | POA: Diagnosis not present

## 2022-10-23 DIAGNOSIS — Z7901 Long term (current) use of anticoagulants: Secondary | ICD-10-CM | POA: Diagnosis not present

## 2022-10-23 DIAGNOSIS — S21102A Unspecified open wound of left front wall of thorax without penetration into thoracic cavity, initial encounter: Secondary | ICD-10-CM | POA: Diagnosis not present

## 2022-10-23 DIAGNOSIS — I509 Heart failure, unspecified: Secondary | ICD-10-CM | POA: Diagnosis not present

## 2022-10-23 DIAGNOSIS — R0789 Other chest pain: Secondary | ICD-10-CM | POA: Diagnosis not present

## 2022-10-23 LAB — CBC WITH DIFFERENTIAL/PLATELET
Abs Immature Granulocytes: 0.03 10*3/uL (ref 0.00–0.07)
Basophils Absolute: 0 10*3/uL (ref 0.0–0.1)
Basophils Relative: 0 %
Eosinophils Absolute: 0.1 10*3/uL (ref 0.0–0.5)
Eosinophils Relative: 2 %
HCT: 36.2 % — ABNORMAL LOW (ref 39.0–52.0)
Hemoglobin: 12.2 g/dL — ABNORMAL LOW (ref 13.0–17.0)
Immature Granulocytes: 0 %
Lymphocytes Relative: 16 %
Lymphs Abs: 1.2 10*3/uL (ref 0.7–4.0)
MCH: 32.7 pg (ref 26.0–34.0)
MCHC: 33.7 g/dL (ref 30.0–36.0)
MCV: 97.1 fL (ref 80.0–100.0)
Monocytes Absolute: 0.8 10*3/uL (ref 0.1–1.0)
Monocytes Relative: 11 %
Neutro Abs: 5.3 10*3/uL (ref 1.7–7.7)
Neutrophils Relative %: 71 %
Platelets: 134 10*3/uL — ABNORMAL LOW (ref 150–400)
RBC: 3.73 MIL/uL — ABNORMAL LOW (ref 4.22–5.81)
RDW: 13.3 % (ref 11.5–15.5)
WBC: 7.5 10*3/uL (ref 4.0–10.5)
nRBC: 0 % (ref 0.0–0.2)

## 2022-10-23 NOTE — ED Notes (Signed)
RN reviewed discharge instructions with pt. Pt verbalized understanding and had no further questions. VSS upon discharge.  

## 2022-10-23 NOTE — ED Notes (Signed)
Quick Clot applied to wounds, bandaged with bacitracin and guaze, bleeding controlled at this time

## 2022-10-23 NOTE — ED Provider Notes (Signed)
EMERGENCY DEPARTMENT AT [facility] Provider Note   CSN: [id number] Arrival date & time: 10/23/22  1851     History  Chief Complaint  Patient presents with   Wound Check    [name] [name] is a 89 y.o. male.  Patient is a 89 year old male with a history of hyperlipidemia, hypertension, CHF, CAD, aortic stenosis, atrial fibrillation on chronic Eliquis and aspirin who had multiple skin biopsies done 2 days ago for squamous cell carcinoma and presents today because he has had ongoing bleeding.  There is a place on his left chest and back that seems to continue to ooze.  His family member reports that it will have some clotting but just continues to ooze and they have to change the bandages regularly.  He did stop taking aspirin yesterday because of the ongoing oozing but has continued his Eliquis.  The history is provided by the patient.  Wound Check       Home Medications Prior to Admission medications   Medication Sig Start Date End Date Taking? Authorizing Provider  apixaban (ELIQUIS) 5 MG TABS tablet Take 1 tablet (5 mg total) by mouth 2 (two) times daily. 09/09/22   [place], Peter M, MD  ascorbic acid (VITAMIN C) 500 MG tablet Take 500 mg by mouth daily.    [provider]  aspirin 81 MG tablet Take 81 mg by mouth at bedtime.     [provider]  B Complex Vitamins (B COMPLEX 100 PO) Take 1 tablet by mouth daily.    [provider]  Biotin 5000 MCG TABS Take 5,000 mcg by mouth daily.    [provider]  Calcium Carb-Cholecalciferol (CALCIUM + VITAMIN D3 PO) Take 1 tablet by mouth daily.    [provider]  cholecalciferol (VITAMIN D3) 25 MCG (1000 UNIT) tablet Take 1,000 Units by mouth daily.    [provider]  Coenzyme Q10 (CO Q-10) 100 MG CAPS Take 100 mg by mouth daily.    [provider]  docusate sodium (COLACE) 100 MG capsule Take 100 mg by mouth daily.    [provider]  dutasteride  (AVODART) 0.5 MG capsule Take 0.5 mg by mouth daily.    [provider]  feeding supplement, ENSURE COMPLETE, (ENSURE COMPLETE) LIQD Take 237 mLs by mouth daily.    [provider]  ferrous sulfate 325 (65 FE) MG tablet Take 325 mg by mouth daily.    [provider]  furosemide (LASIX) 40 MG tablet Take 40 mg by mouth 2 (two) times daily.    [provider]  magnesium oxide (MAG-OX) 400 (240 Mg) MG tablet Take 400 mg by mouth daily.    [provider]  Methylcellulose, Laxative, (FIBER THERAPY PO) Take 2 capsules by mouth daily.    [provider]  mirtazapine (REMERON) 7.5 MG tablet Take 7.5 mg by mouth at bedtime. 09/30/22   [provider]  Misc Natural Products (JOINT HEALTH PO) Take 1 tablet by mouth daily.    [provider]  Misc Natural Products (PROSTATE COMPLETE PO) Take 1 capsule by mouth daily.    [provider]  Multiple Vitamins-Minerals (CENTRUM SILVER PO) Take 1 tablet by mouth daily.     [provider]  Multiple Vitamins-Minerals (LUTEIN-ZEAXANTHIN PO) Take 1 tablet by mouth daily.    [provider]  OVER THE COUNTER MEDICATION Take 1 capsule by mouth daily. Testosterone Support    [provider]  OVER THE COUNTER  MEDICATION Take 1 capsule by mouth daily. Neuriva    [provider]  Potassium 99 MG TABS Take 99 mg by mouth daily.    [provider]  Saw Palmetto 450 MG CAPS Take 450 mg by mouth daily.    [provider]  sennosides-docusate sodium (SENOKOT-S) 8.6-50 MG tablet Take 2 tablets by mouth daily.    [provider]  sulfaSALAzine (AZULFIDINE) 500 MG tablet Take 2,000 mg by mouth daily. 08/15/20   [provider]  tamsulosin (FLOMAX) 0.4 MG CAPS Take 0.4 mg by mouth 2 (two) times daily.     [provider]  TURMERIC CURCUMIN PO Take 1,500 mg by mouth daily.    [provider]      Allergies     Simvastatin    Review of Systems   Review of Systems  Physical Exam Updated Vital Signs BP 114/67   Pulse (!) 56   Temp 97.7 F (36.5 C) (Oral)   Resp (!) 21   Ht 5\' 10"  (1.778 m)   Wt 73.5 kg   SpO2 96%   BMI 23.24 kg/m  Physical Exam Vitals and nursing note reviewed.  Constitutional:      General: He is not in acute distress.    Appearance: He is well-developed. He is ill-appearing.  HENT:     Head: Normocephalic and atraumatic.  Eyes:     Conjunctiva/sclera: Conjunctivae normal.     Pupils: Pupils are equal, round, and reactive to light.  Cardiovascular:     Rate and Rhythm: Normal rate. Rhythm irregularly irregular.     Heart sounds: No murmur heard. Pulmonary:     Effort: Pulmonary effort is normal. No respiratory distress.     Breath sounds: Normal breath sounds. No wheezing or rales.     Comments: Circular biopsy present in the left chest with mild oozing of blood Abdominal:     General: There is no distension.     Palpations: Abdomen is soft.     Tenderness: There is no abdominal tenderness. There is no guarding or rebound.  Musculoskeletal:        General: No tenderness. Normal range of motion.     Cervical back: Normal range of motion and neck supple.     Left lower leg: Edema present.     Comments: 1+ pitting edema in the left lower extremity.  Circular biopsy mark in the mid back with no acute bleeding.  Skin:    General: Skin is warm and dry.     Findings: No erythema or rash.  Neurological:     Mental Status: He is alert and oriented to person, place, and time. Mental status is at baseline.  Psychiatric:        Behavior: Behavior normal.     ED Results / Procedures / Treatments   Labs (all labs ordered are listed, but only abnormal results are displayed) Labs Reviewed  CBC WITH DIFFERENTIAL/PLATELET - Abnormal; Notable for the following components:      Result Value   RBC 3.73 (*)    Hemoglobin 12.2 (*)    HCT 36.2 (*)    Platelets 134 (*)     All other components within normal limits    EKG None  Radiology No results found.  Procedures Procedures    Medications Ordered in ED Medications - No data to display  ED Course/ Medical Decision Making/ A&P  Medical Decision Making Amount and/or Complexity of Data Reviewed Labs: ordered. Decision-making details documented in ED Course.   Elderly male with multiple medical problems presenting today with constant oozing from a biopsy site 2 days ago.  Patient has no brisk bleeding but does have some mild oozing of his chest site.  This is most likely because he takes Eliquis and aspirin.  I independently interpreted patient's labs and his CBC showed a stable hemoglobin.  His vital signs are otherwise reassuring.  Quick clot was placed and bleeding has stopped.  Feel that patient is stable for discharge home at this time.  He and his family member are comfortable with this plan.        Final Clinical Impression(s) / ED Diagnoses Final diagnoses:  Hemorrhage from open wound of left chest wall, initial encounter    Rx / DC Orders ED Discharge Orders     None         Gwyneth Sprout, MD 10/23/22 2303

## 2022-10-23 NOTE — ED Triage Notes (Signed)
Pt was recently seen by a dermatologist who took biopsies of areas in his chest and back, c/o heavy bleeding to both areas seeping through dressings and clothes. Pt BIB spouse from home.

## 2022-10-23 NOTE — Discharge Instructions (Signed)
Leave the bandage on until tomorrow.  Then you can change the bandage and do what you are doing with a little bit of Vaseline and gauze.  Use the clotting agent as needed if it continues to ooze tomorrow.  Blood counts look normal today.

## 2022-10-26 ENCOUNTER — Other Ambulatory Visit (HOSPITAL_COMMUNITY): Payer: Self-pay | Admitting: Urology

## 2022-10-26 DIAGNOSIS — R9721 Rising PSA following treatment for malignant neoplasm of prostate: Secondary | ICD-10-CM

## 2022-10-26 DIAGNOSIS — C61 Malignant neoplasm of prostate: Secondary | ICD-10-CM

## 2022-10-28 DIAGNOSIS — R262 Difficulty in walking, not elsewhere classified: Secondary | ICD-10-CM | POA: Diagnosis not present

## 2022-10-28 DIAGNOSIS — M6281 Muscle weakness (generalized): Secondary | ICD-10-CM | POA: Diagnosis not present

## 2022-10-28 IMAGING — DX DG CHEST 1V PORT
1 series · 1 of 1 positions shown · non-contrast
Comparison: 06/15/2020

CLINICAL DATA: Shortness of breath, pleural effusion, open heart
surgery

EXAM:
PORTABLE CHEST 1 VIEW

[chest ap]
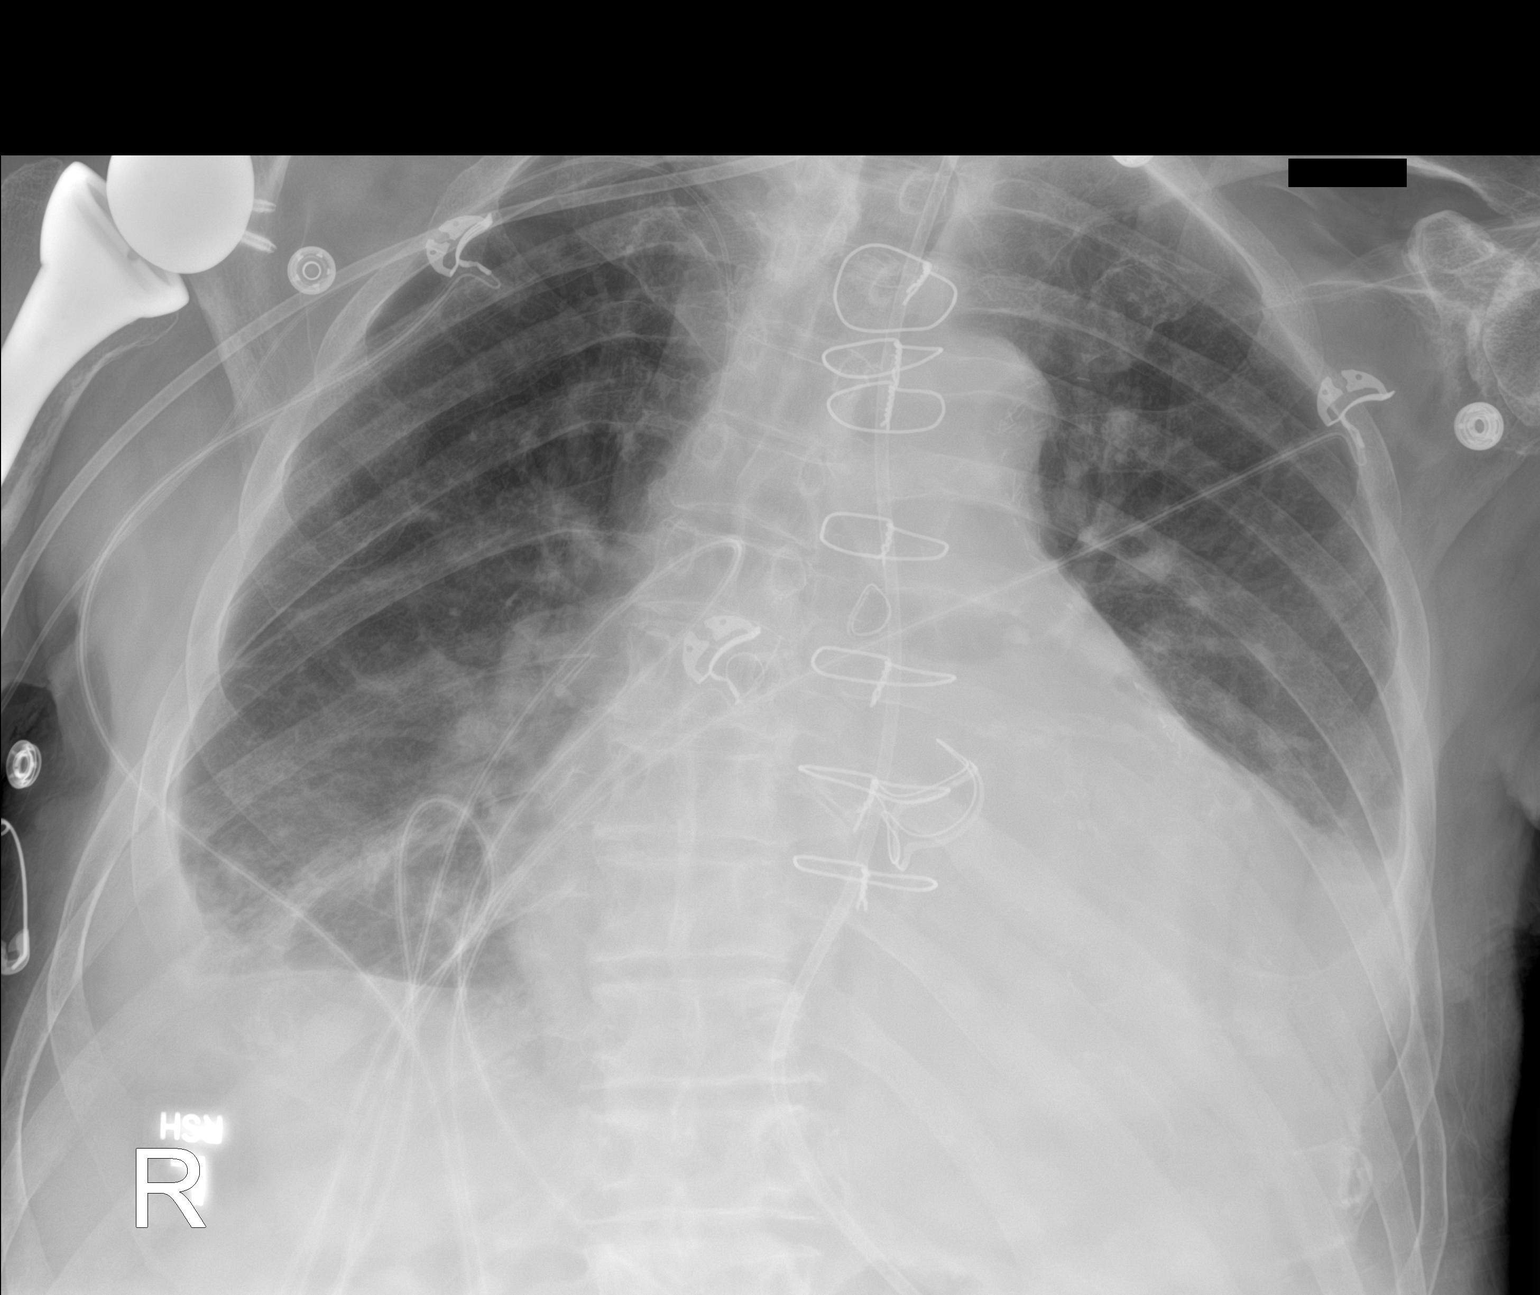

[1 of 1 positions shown; findings below may reference images not displayed]

FINDINGS: Small to moderate left pleural effusion. Trace right pleural
effusion. Pulmonary vascular congestion without frank interstitial
edema.

Cardiomegaly. Prosthetic valve. Postsurgical changes related to
prior CABG. Median sternotomy.

Enteric tube courses into the stomach.

Right shoulder arthroplasty.
IMPRESSION: Stable bilateral pleural effusions, left greater than right. No
frank interstitial edema.

Stable support apparatus as above.

## 2022-11-08 DIAGNOSIS — R339 Retention of urine, unspecified: Secondary | ICD-10-CM | POA: Diagnosis not present

## 2022-11-12 DIAGNOSIS — M6281 Muscle weakness (generalized): Secondary | ICD-10-CM | POA: Diagnosis not present

## 2022-11-12 DIAGNOSIS — R262 Difficulty in walking, not elsewhere classified: Secondary | ICD-10-CM | POA: Diagnosis not present

## 2022-11-19 ENCOUNTER — Encounter (HOSPITAL_COMMUNITY)
Admission: RE | Admit: 2022-11-19 | Discharge: 2022-11-19 | Disposition: A | Discharge status: Home / Self Care | Payer: Medicare HMO | Source: Ambulatory Visit | Attending: Urology | Admitting: Urology

## 2022-11-19 DIAGNOSIS — R9721 Rising PSA following treatment for malignant neoplasm of prostate: Secondary | ICD-10-CM | POA: Insufficient documentation

## 2022-11-19 DIAGNOSIS — C61 Malignant neoplasm of prostate: Secondary | ICD-10-CM | POA: Insufficient documentation

## 2022-11-19 MED ORDER — PIFLIFOLASTAT F 18 (PYLARIFY) INJECTION
9.0000 | Freq: Once | INTRAVENOUS | Status: AC
  Administered 2022-11-19: 9.1 via INTRAVENOUS

## 2022-11-24 DIAGNOSIS — L814 Other melanin hyperpigmentation: Secondary | ICD-10-CM | POA: Diagnosis not present

## 2022-11-24 DIAGNOSIS — Z85828 Personal history of other malignant neoplasm of skin: Secondary | ICD-10-CM | POA: Diagnosis not present

## 2022-11-24 DIAGNOSIS — B351 Tinea unguium: Secondary | ICD-10-CM | POA: Diagnosis not present

## 2022-11-24 DIAGNOSIS — L820 Inflamed seborrheic keratosis: Secondary | ICD-10-CM | POA: Diagnosis not present

## 2022-11-24 DIAGNOSIS — L821 Other seborrheic keratosis: Secondary | ICD-10-CM | POA: Diagnosis not present

## 2022-11-24 DIAGNOSIS — D099 Carcinoma in situ, unspecified: Secondary | ICD-10-CM | POA: Diagnosis not present

## 2022-11-24 DIAGNOSIS — L570 Actinic keratosis: Secondary | ICD-10-CM | POA: Diagnosis not present

## 2022-11-24 DIAGNOSIS — D225 Melanocytic nevi of trunk: Secondary | ICD-10-CM | POA: Diagnosis not present

## 2022-11-24 DIAGNOSIS — L578 Other skin changes due to chronic exposure to nonionizing radiation: Secondary | ICD-10-CM | POA: Diagnosis not present

## 2022-11-24 DIAGNOSIS — C4492 Squamous cell carcinoma of skin, unspecified: Secondary | ICD-10-CM | POA: Diagnosis not present

## 2022-11-25 DIAGNOSIS — R262 Difficulty in walking, not elsewhere classified: Secondary | ICD-10-CM | POA: Diagnosis not present

## 2022-11-25 DIAGNOSIS — M6281 Muscle weakness (generalized): Secondary | ICD-10-CM | POA: Diagnosis not present

## 2022-12-01 DIAGNOSIS — C44529 Squamous cell carcinoma of skin of other part of trunk: Secondary | ICD-10-CM | POA: Diagnosis not present

## 2022-12-01 DIAGNOSIS — D485 Neoplasm of uncertain behavior of skin: Secondary | ICD-10-CM | POA: Diagnosis not present

## 2022-12-08 DIAGNOSIS — K5190 Ulcerative colitis, unspecified, without complications: Secondary | ICD-10-CM | POA: Diagnosis not present

## 2022-12-08 DIAGNOSIS — E861 Hypovolemia: Secondary | ICD-10-CM | POA: Diagnosis not present

## 2022-12-08 DIAGNOSIS — G479 Sleep disorder, unspecified: Secondary | ICD-10-CM | POA: Diagnosis not present

## 2022-12-08 DIAGNOSIS — N1831 Chronic kidney disease, stage 3a: Secondary | ICD-10-CM | POA: Diagnosis not present

## 2022-12-08 DIAGNOSIS — R29898 Other symptoms and signs involving the musculoskeletal system: Secondary | ICD-10-CM | POA: Diagnosis not present

## 2022-12-08 DIAGNOSIS — C61 Malignant neoplasm of prostate: Secondary | ICD-10-CM | POA: Diagnosis not present

## 2022-12-08 DIAGNOSIS — I480 Paroxysmal atrial fibrillation: Secondary | ICD-10-CM | POA: Diagnosis not present

## 2022-12-08 DIAGNOSIS — K5900 Constipation, unspecified: Secondary | ICD-10-CM | POA: Diagnosis not present

## 2022-12-08 DIAGNOSIS — I5042 Chronic combined systolic (congestive) and diastolic (congestive) heart failure: Secondary | ICD-10-CM | POA: Diagnosis not present

## 2022-12-08 DIAGNOSIS — I700 Atherosclerosis of aorta: Secondary | ICD-10-CM | POA: Diagnosis not present

## 2022-12-08 DIAGNOSIS — C7989 Secondary malignant neoplasm of other specified sites: Secondary | ICD-10-CM | POA: Diagnosis not present

## 2022-12-08 DIAGNOSIS — R634 Abnormal weight loss: Secondary | ICD-10-CM | POA: Diagnosis not present

## 2022-12-09 DIAGNOSIS — R262 Difficulty in walking, not elsewhere classified: Secondary | ICD-10-CM | POA: Diagnosis not present

## 2022-12-09 DIAGNOSIS — M6281 Muscle weakness (generalized): Secondary | ICD-10-CM | POA: Diagnosis not present

## 2022-12-15 DIAGNOSIS — D485 Neoplasm of uncertain behavior of skin: Secondary | ICD-10-CM | POA: Diagnosis not present

## 2022-12-15 DIAGNOSIS — L905 Scar conditions and fibrosis of skin: Secondary | ICD-10-CM | POA: Diagnosis not present

## 2022-12-15 DIAGNOSIS — C44622 Squamous cell carcinoma of skin of right upper limb, including shoulder: Secondary | ICD-10-CM | POA: Diagnosis not present

## 2022-12-16 DIAGNOSIS — R262 Difficulty in walking, not elsewhere classified: Secondary | ICD-10-CM | POA: Diagnosis not present

## 2022-12-16 DIAGNOSIS — M6281 Muscle weakness (generalized): Secondary | ICD-10-CM | POA: Diagnosis not present

## 2022-12-17 DIAGNOSIS — C778 Secondary and unspecified malignant neoplasm of lymph nodes of multiple regions: Secondary | ICD-10-CM | POA: Diagnosis not present

## 2022-12-17 DIAGNOSIS — C61 Malignant neoplasm of prostate: Secondary | ICD-10-CM | POA: Diagnosis not present

## 2022-12-21 ENCOUNTER — Telehealth (HOSPITAL_COMMUNITY): Payer: Self-pay | Admitting: Cardiology

## 2022-12-21 DIAGNOSIS — M6281 Muscle weakness (generalized): Secondary | ICD-10-CM | POA: Diagnosis not present

## 2022-12-21 DIAGNOSIS — R262 Difficulty in walking, not elsewhere classified: Secondary | ICD-10-CM | POA: Diagnosis not present

## 2022-12-21 NOTE — Telephone Encounter (Signed)
Patients wife called to report new medication for prostate CA has warning label of "possible change in electrical functions". With patients history of Afib wanted to ok with cardiology  New med: Orgovyx (relugolix)  Message to provider/pharmacist to review

## 2022-12-23 DIAGNOSIS — M6281 Muscle weakness (generalized): Secondary | ICD-10-CM | POA: Diagnosis not present

## 2022-12-23 DIAGNOSIS — R262 Difficulty in walking, not elsewhere classified: Secondary | ICD-10-CM | POA: Diagnosis not present

## 2022-12-24 NOTE — Telephone Encounter (Signed)
LMOM

## 2022-12-27 DIAGNOSIS — R262 Difficulty in walking, not elsewhere classified: Secondary | ICD-10-CM | POA: Diagnosis not present

## 2022-12-27 DIAGNOSIS — M6281 Muscle weakness (generalized): Secondary | ICD-10-CM | POA: Diagnosis not present

## 2023-01-03 DIAGNOSIS — R339 Retention of urine, unspecified: Secondary | ICD-10-CM | POA: Diagnosis not present

## 2023-01-12 DIAGNOSIS — C61 Malignant neoplasm of prostate: Secondary | ICD-10-CM | POA: Diagnosis not present

## 2023-01-18 DIAGNOSIS — C44719 Basal cell carcinoma of skin of left lower limb, including hip: Secondary | ICD-10-CM | POA: Diagnosis not present

## 2023-01-19 DIAGNOSIS — N312 Flaccid neuropathic bladder, not elsewhere classified: Secondary | ICD-10-CM | POA: Diagnosis not present

## 2023-01-19 DIAGNOSIS — C61 Malignant neoplasm of prostate: Secondary | ICD-10-CM | POA: Diagnosis not present

## 2023-01-19 DIAGNOSIS — C778 Secondary and unspecified malignant neoplasm of lymph nodes of multiple regions: Secondary | ICD-10-CM | POA: Diagnosis not present

## 2023-01-31 DIAGNOSIS — H353132 Nonexudative age-related macular degeneration, bilateral, intermediate dry stage: Secondary | ICD-10-CM | POA: Diagnosis not present

## 2023-02-23 DIAGNOSIS — C44329 Squamous cell carcinoma of skin of other parts of face: Secondary | ICD-10-CM | POA: Diagnosis not present

## 2023-02-23 DIAGNOSIS — D485 Neoplasm of uncertain behavior of skin: Secondary | ICD-10-CM | POA: Diagnosis not present

## 2023-02-23 DIAGNOSIS — L570 Actinic keratosis: Secondary | ICD-10-CM | POA: Diagnosis not present

## 2023-02-23 DIAGNOSIS — D045 Carcinoma in situ of skin of trunk: Secondary | ICD-10-CM | POA: Diagnosis not present

## 2023-02-23 DIAGNOSIS — Z9889 Other specified postprocedural states: Secondary | ICD-10-CM | POA: Diagnosis not present

## 2023-02-28 ENCOUNTER — Other Ambulatory Visit: Payer: Self-pay | Admitting: Cardiology

## 2023-02-28 NOTE — Progress Notes (Signed)
Cardiology Office Note   Date:  03/07/2023   ID:  [name] [name], DOB [birth year], MRN [id number]  PCP:  [name] Dimitri, MD  Cardiologist:   [name] Staver [place], MD   No chief complaint on file.      History of Present Illness: Mithcell [name] is a 89 y.o. male who is seen for follow up CHF, AS. He has a history of venous insufficiency with chronic lower extremity edema,hypertension, hyperlipidemia, peripheral neuropathy, essential tremor, and prostate cancer.   Patient first seen  in 09/2012 for evaluation of dyspnea on exertion and lower extremity edema, especially in his left leg. Venous dopplers were ordered and showed no DVT. Echo showed LVEF of 55-60% with moderate asymmetric basal septal hypertrophy with no LV outflow tract gradient and mild to moderate MR. He was seen  in 07/2017 at which time he was doing well. He denied any chest pain or shortness of breath. He continued to report some lower extremity edema that resolved with elevation of legs at night. Edema felt to be due to venous insufficiency and conservative management with sodium restriction, elevation of legs, and compression stockings was recommended.    He was diagnosed with right middle lobe pneumonia on 10/12/2019 after presenting for further evaluation of fatigue while on vacation at the beach.. He tested negative for COVID. He was started on Levaquin but this had to be stopped when he developed tendonitis. He was started on Azithromycin instead on 10/26/2019. Repeat chest x-ray on 10/26/2019 showed increased density of right lower lobe pneumonia. He had persistent fatigue  and developed significant lower extremity edema and weight gain. Patient was seen by PCP again on 11/14/2019. Repeat chest x-ray showed continued right basilar pneumonia and increased right pleural effusion. BNP was checked and came back elevated at 1,380. He was started on Lasix 80mg  daily. Chest CT was also ordered to rule out underlying lung mass. CT was performed on  11/15/2019 and showed mild cardiomegaly, moderate right pleural effusion with adjacent atelectasis or pneumonia in the right lower and middle lobes as well as coronary artery calcifications. Of note, prior to being started on Lasix, he was seen by Urology for urinary retention and had a catheter placed.  This was later removed and he performed in and out cath.   He had an Echo done in June 2021 showing EF 40-45% with severe HK of the entire inferior wall. There was severe basal septal hypertrophy with gr 2 diastolic dysfunction. Moderate MR, mild AS and mild Aortic root enlargement. We started him on losartan 25 mg daily but dose had to be reduced to 12.5 mg due to hypotension. He was seen in September with worsening CHF with weight gain, edema and worsening fatigue. Lasix dose was increased. Echo showed further decline in LV function with EF 30-35%. AS was felt to be moderate to severe ( low flow AS).   He subsequently underwent cardiac cath. This demonstrated severe 3 vessel CAD with complex anatomy as well as low flow severe AS.  He was seen in consultation with Dr Laneta Simmers and underwent combined AVR with a #29 Edwards Inspiris Resilia valve and CABG with LIMA to the LAD, sequential SVG to OM1 and OM2, and SVG to the PDA on 05/23/20.  His post op course was complicated by persistent low output CHF and hypotension. No beta blocker due to hypotension. No digoxin due to some AV block. Was followed inpatient by the Heart failure service. Was on Dobutamine until Dec 18 then weaned off.  He had some post op Afib but converted to NSR. He was placed on midodrine for hypotension. He had significant debility and was evaluated by OT an PT. He was treated for PNA. Repeat Echo showed EF 35% with good AVR prosthesis function. He was DC to SNF on 06/26/20.   Followed in Advanced heart failure clinic. Was felt to be in Afib and DCCV arranged but when he presented to the hospital he was in NSR.   When last seen we started him  on Entresto. He was seen in the Advanced heart failure clinic on 5/11 and was experiencing orthostasis and was switched back to losartan. Amiodarone discontinued. Was on lasix about every other day. Echo was repeated with EF 35-40%. Stable AV prosthesis. Mild to mod MR. Moderate RV dysfunction.   Was seen in Advanced heart failure clinic in Dec. Noted symptoms of fatigue. Losartan dose reduced due to low BP. Home sleep study recommended but they never followed through.   He is seen with his wife today. His major complaint is of lack of energy.  He is taking lasix twice a day now with increased right leg edema. . No chest pain, palpitations, orthopnea. He is able to walk around his kitchen and bedroom 35 times. He is riding recumbent bike at the gym every other day for 40 minutes. He does complain of constipation. His weight is actually down 8 lbs. States he gets full easily.   Was seen in Advanced heart failure clinic in September. Noted fatigue but otherwise stable. Echo showed EF 40-45%, improved.  RV mod reduced, mod MR, mild-mod TR, trivial AR, normal structure and function of the aortic valve prosthesis. Home sleep study recommended. This showed only mild OSA.   He was seen by Dr Gala Romney and underwent successful DCCV on April 11. On April 27, however, he was seen in the ED for some bleeding issues and Ecg showed Aflutter with variable response.   On follow up today he is doing OK. His main complaint is that he feels bogged down like he has an anchor. Notes his PSA is high and he is being treated with Orovyx for advanced prostate CA. He is sleeping and eating well but has lost 10 lbs in the past year. No increased SOB, edema. No chest pain.  Past Medical History:  Diagnosis Date   Aortic stenosis    Arthritis    CAD (coronary artery disease)    CHF (congestive heart failure) (HCC)    Colitis    Edema    Essential hypertension    no med now   Essential tremor    Hyperlipidemia     Hypotonic bladder    L4-L5   Obstructive uropathy    Peripheral neuropathy    Elevated by Dr. love 2010   Prostate cancer [facility]) 2005    Past Surgical History:  Procedure Laterality Date   ACHILLES TENDON SURGERY Bilateral 1995   AORTIC VALVE REPLACEMENT N/A 05/23/2020   Procedure: AORTIC VALVE REPLACEMENT (AVR) USING EDWARDS RESILIA 29 MM AORTIC VALVE.;  Surgeon: Alleen Borne, MD;  Location: [facility] OR;  Service: Open Heart Surgery;  Laterality: N/A;   CARDIAC CATHETERIZATION     CARDIOVERSION N/A 10/07/2022   Procedure: CARDIOVERSION;  Surgeon: Dolores Patty, MD;  Location: [facility] INVASIVE CV LAB;  Service: Cardiovascular;  Laterality: N/A;   COLONOSCOPY     With polyp resection   CORONARY ARTERY BYPASS GRAFT N/A 05/23/2020   Procedure: CORONARY ARTERY BYPASS GRAFTING (CABG) USING LIMA  to LAD; ENDOSCOPIC HARVESTED RIGHT GREATER SAPHENOUS VEIN: SVG to PD; SVG to SEQUENCED INTERMEDIATE to OM;  Surgeon: Alleen Borne, MD;  Location: [facility] OR;  Service: Open Heart Surgery;  Laterality: N/A;   ENDOVEIN HARVEST OF GREATER SAPHENOUS VEIN Right 05/23/2020   Procedure: ENDOVEIN HARVEST OF GREATER SAPHENOUS VEIN;  Surgeon: Alleen Borne, MD;  Location: [facility] OR;  Service: Open Heart Surgery;  Laterality: Right;   REVERSE SHOULDER ARTHROPLASTY Right 06/04/2016   REVERSE SHOULDER ARTHROPLASTY Right 06/04/2016   Procedure: REVERSE SHOULDER ARTHROPLASTY;  Surgeon: Beverely Low, MD;  Location: [facility] OR;  Service: Orthopedics;  Laterality: Right;   RIGHT/LEFT HEART CATH AND CORONARY ANGIOGRAPHY N/A 05/20/2020   Procedure: RIGHT/LEFT HEART CATH AND CORONARY ANGIOGRAPHY;  Surgeon: [name], Austen Wygant M, MD;  Location: [facility] INVASIVE CV LAB;  Service: Cardiovascular;  Laterality: N/A;   SKIN CANCER EXCISION  1974   On chest wall   TEE WITHOUT CARDIOVERSION N/A 05/23/2020   Procedure: TRANSESOPHAGEAL ECHOCARDIOGRAM (TEE);  Surgeon: Alleen Borne, MD;  Location: [facility] OR;  Service: Open Heart Surgery;  Laterality: N/A;    TOTAL KNEE ARTHROPLASTY Right 2011   TOTAL KNEE ARTHROPLASTY Left 04/09/2013   Procedure: LEFT TOTAL KNEE ARTHROPLASTY;  Surgeon: Loanne Drilling, MD;  Location: [facility] ORS;  Service: Orthopedics;  Laterality: Left;     Current Outpatient Medications  Medication Sig Dispense Refill   apixaban (ELIQUIS) 5 MG TABS tablet Take 1 tablet (5 mg total) by mouth 2 (two) times daily. 180 tablet 3   ascorbic acid (VITAMIN C) 500 MG tablet Take 500 mg by mouth daily.     aspirin 81 MG tablet Take 81 mg by mouth at bedtime.      B Complex Vitamins (B COMPLEX 100 PO) Take 1 tablet by mouth daily.     Biotin 5000 MCG TABS Take 5,000 mcg by mouth daily.     Calcium Carb-Cholecalciferol (CALCIUM + VITAMIN D3 PO) Take 1 tablet by mouth daily.     cholecalciferol (VITAMIN D3) 25 MCG (1000 UNIT) tablet Take 1,000 Units by mouth daily.     Coenzyme Q10 (CO Q-10) 100 MG CAPS Take 100 mg by mouth daily.     docusate sodium (COLACE) 100 MG capsule Take 100 mg by mouth daily.     dutasteride (AVODART) 0.5 MG capsule Take 0.5 mg by mouth daily.     feeding supplement, ENSURE COMPLETE, (ENSURE COMPLETE) LIQD Take 237 mLs by mouth daily.     ferrous sulfate 325 (65 FE) MG tablet Take 325 mg by mouth daily.     furosemide (LASIX) 40 MG tablet Take 1 tablet by mouth twice daily 180 tablet 3   magnesium oxide (MAG-OX) 400 (240 Mg) MG tablet Take 400 mg by mouth daily.     Methylcellulose, Laxative, (FIBER THERAPY PO) Take 2 capsules by mouth daily.     mirtazapine (REMERON) 7.5 MG tablet Take 7.5 mg by mouth at bedtime.     Misc Natural Products (JOINT HEALTH PO) Take 1 tablet by mouth daily.     Misc Natural Products (PROSTATE COMPLETE PO) Take 1 capsule by mouth daily.     Multiple Vitamins-Minerals (CENTRUM SILVER PO) Take 1 tablet by mouth daily.      Multiple Vitamins-Minerals (LUTEIN-ZEAXANTHIN PO) Take 1 tablet by mouth daily.     OVER THE COUNTER MEDICATION Take 1 capsule by mouth daily. Neuriva     Potassium 99  MG TABS Take 99 mg by mouth daily.  Saw Palmetto 450 MG CAPS Take 450 mg by mouth daily.     sennosides-docusate sodium (SENOKOT-S) 8.6-50 MG tablet Take 2 tablets by mouth daily.     sulfaSALAzine (AZULFIDINE) 500 MG tablet Take 2,000 mg by mouth daily.     tamsulosin (FLOMAX) 0.4 MG CAPS Take 0.4 mg by mouth 2 (two) times daily.      TURMERIC CURCUMIN PO Take 1,500 mg by mouth daily.     ORGOVYX 120 MG tablet Take 120 mg by mouth daily.     OVER THE COUNTER MEDICATION Take 1 capsule by mouth daily. Testosterone Support (Patient not taking: Reported on 03/07/2023)     No current facility-administered medications for this visit.    Allergies:   Simvastatin    Social History:  The patient  reports that he has never smoked. He has never used smokeless tobacco. He reports current alcohol use of about 1.0 standard drink of alcohol per week. He reports that he does not use drugs.   Family History:  The patient's family history includes Diabetes in his mother; Heart attack in his brother and maternal aunt; Heart failure in his mother; Hypertension in his mother.    ROS:  Please see the history of present illness.   Otherwise, review of systems are positive for none.   All other systems are reviewed and negative.    PHYSICAL EXAM: VS:  BP 100/67 (BP Location: Left Arm, Patient Position: Sitting, Cuff Size: Normal)   Pulse 60   Ht 5\' 8"  (1.727 m)   Wt 161 lb 12.8 oz (73.4 kg)   SpO2 97%   BMI 24.60 kg/m  , BMI Body mass index is 24.6 kg/m. GEN: elderly WM, well developed, in no acute distress  HEENT: normal  Neck: no JVD, no carotid bruits, or masses Cardiac: IRRR; gr 1/6 systolic murmur at the RUSB. No  rubs, or gallops.  Respiratory:  clear GI  soft, nontender, nondistended, + BS MS: no deformity or atrophy  Skin: warm and dry, no rash Ext: no edema Neuro:  Strength and sensation are intact Psych: euthymic mood, full affect   EKG:  EKG is not ordered today.  Recent  Labs: 03/18/2022: B Natriuretic Peptide 779.6; BUN 14; Creatinine, Ser 1.20; Potassium 4.0; Sodium 137 10/23/2022: Hemoglobin 12.2; Platelets 134    Lipid Panel No results found for: "CHOL", "TRIG", "HDL", "CHOLHDL", "VLDL", "LDLCALC", "LDLDIRECT"    Wt Readings from Last 3 Encounters:  03/07/23 161 lb 12.8 oz (73.4 kg)  10/23/22 162 lb (73.5 kg)  09/23/22 167 lb 9.6 oz (76 kg)    - 10/12/2019: WBC 7.6, Hgb 14.2, Plts 246. Na 140, K 4.4, Glucose 103, BUN 12, Cr 1.26. AST 29, ALT 21, Alk Phos 61, Total Bili 0.5. TSH 0.849. UA showed 30mg  of protein but was otherwise unremarkable.  - 10/26/2019: Cr 1.20.  - 11/14/2019: BNP 1,380. Na 140, K 4.6, Glucose 107, BUN 22, Cr 1.16. AST 26, ALT, Alk Phos 59.  Dated 12/06/19: BUN 27, creatinine 1.4. CBC and CMET normal. Labs dated1/20/22: BUN 18, creatinine 1.2. Lytes OK. Hgb 11.1. normal WBC. BNP 2010.  Dated 08/24/21: BUN 27. Creatinine 1.2. otherwise CMET normal, TSH normal.  Dated 11/20/21: normal CBC.  Dated 229/24: A1c 5.5%, BUN 35, creatinine 1.35. otherwise BMET normal. CBC normal.   CXR report 07/15/20: moderate bilateral pleural effusions.   Other studies Reviewed: Additional studies/ records that were reviewed today include:  Echo 12/10/19: IMPRESSIONS     1. Left ventricular ejection  fraction, by estimation, is 40 to 45%. The  left ventricle has mildly decreased function. The left ventricle  demonstrates global hypokinesis. There is severe left ventricular  hypertrophy of the basal-septal segment. Left  ventricular diastolic parameters are consistent with Grade II diastolic  dysfunction (pseudonormalization). There is severe hypokinesis of the left  ventricular, entire inferior wall and inferoseptal wall.   2. Right ventricular systolic function is normal. The right ventricular  size is mildly enlarged. There is normal pulmonary artery systolic  pressure. The estimated right ventricular systolic pressure is 31.2 mmHg.   3. Left atrial  size was mild to moderately dilated.   4. The mitral valve is normal in structure. Moderate mitral valve  regurgitation. No evidence of mitral stenosis.   5. The aortic valve is tricuspid. Aortic valve regurgitation is not  visualized. Mild aortic valve stenosis. Aortic valve area, by VTI measures  1.51 cm. Aortic valve mean gradient measures 20.0 mmHg. Aortic valve Vmax  measures 2.46 m/s. The degree of AS   may be underestimated in the setting of LV dysfunction. rree-=--   6. Aortic dilatation noted. There is mild dilatation of the aortic root  and of the ascending aorta measuring 42 mm and 41mm respectively.   7. The inferior vena cava is dilated in size with >50% respiratory  variability, suggesting right atrial pressure of 8 mmHg.    Echo 04/01/20: IMPRESSIONS     1. LVEF has decreased from 12/10/2019, now 30-35% with LV dyssynchrony.  Aortic stenosis is most probably severe with low flow low gradient,  dimensionless index 0.26.   2. Left ventricular ejection fraction, by estimation, is 30 to 35%. The  left ventricle has moderately decreased function. The left ventricle  demonstrates global hypokinesis. The left ventricular internal cavity size  was mildly dilated. There is severe  asymmetric left ventricular hypertrophy of the basal-septal segment. Left  ventricular diastolic function could not be evaluated.   3. Right ventricular systolic function is mildly reduced. The right  ventricular size is mildly enlarged. There is moderately elevated  pulmonary artery systolic pressure. The estimated right ventricular  systolic pressure is 55.2 mmHg.   4. Left atrial size was severely dilated.   5. Right atrial size was moderately dilated.   6. The mitral valve is normal in structure. Moderate mitral valve  regurgitation. No evidence of mitral stenosis.   7. The aortic valve is normal in structure. Aortic valve regurgitation is  not visualized. Moderate to severe aortic valve stenosis.  Aortic valve  mean gradient measures 24.0 mmHg.   8. Aortic dilatation noted. There is mild to moderate dilatation of the  ascending aorta, measuring 44 mm.   9. The inferior vena cava is dilated in size with <50% respiratory  variability, suggesting right atrial pressure of 15 mmHg.   Echo 11/05/20: IMPRESSIONS     1. Left ventricular ejection fraction, by estimation, is 35 to 40%. The  left ventricle has moderately decreased function. The left ventricle  demonstrates regional wall motion abnormalities (see scoring  diagram/findings for description). There is  moderate left ventricular hypertrophy. Left ventricular diastolic  parameters are consistent with Grade I diastolic dysfunction (impaired  relaxation). There is akinesis of the left ventricular, entire inferior  wall.   2. Right ventricular systolic function is moderately reduced. The right  ventricular size is moderately enlarged. There is normal pulmonary artery  systolic pressure.   3. Left atrial size was severely dilated.   4. Right atrial size was severely  dilated.   5. The mitral valve is normal in structure. Mild to moderate mitral valve  regurgitation. No evidence of mitral stenosis.   6. The aortic valve has been repaired/replaced. Aortic valve  regurgitation is trivial. No aortic stenosis is present. There is a 29 mm  Edwards bioprosthetic valve present in the aortic position. Procedure  Date: 05/23/2020. Echo findings are consistent  with normal structure and function of the aortic valve prosthesis.   7. Aortic dilatation noted. There is mild dilatation of the aortic root,  measuring 42 mm. There is mild dilatation of the ascending aorta,  measuring 43 mm.   8. The inferior vena cava is normal in size with greater than 50%  respiratory variability, suggesting right atrial pressure of 3 mmHg.   Echo 03/18/22: IMPRESSIONS     1. Left ventricular ejection fraction, by estimation, is 40 to 45%. The  left ventricle  has mildly decreased function. The left ventricle  demonstrates regional wall motion abnormalities (see scoring  diagram/findings for description). There is severe  asymmetric left ventricular hypertrophy of the basal-septal segment. Left  ventricular diastolic parameters are indeterminate. There is akinesis of  the left ventricular, entire inferior wall and inferolateral wall.   2. Right ventricular systolic function is moderately reduced. The right  ventricular size is mildly enlarged. There is normal pulmonary artery  systolic pressure.   3. Left atrial size was severely dilated.   4. Right atrial size was severely dilated.   5. The mitral valve is degenerative. Moderate mitral valve regurgitation.  No evidence of mitral stenosis.   6. Tricuspid valve regurgitation is mild to moderate.   7. The aortic valve has been repaired/replaced. Aortic valve  regurgitation is trivial. No aortic stenosis is present. There is a 29 mm  bioprosthetic valve present in the aortic position. Procedure Date:  05/23/2020. Echo findings are consistent with  normal structure and function of the aortic valve prosthesis. Aortic valve  area, by VTI measures 4.62 cm. Aortic valve mean gradient measures 5.0  mmHg. Aortic valve Vmax measures 1.42 m/s.   8. The inferior vena cava is normal in size with greater than 50%  respiratory variability, suggesting right atrial pressure of 3 mmHg.    ASSESSMENT AND PLAN:  1. Chronic combined systolic/diastolic CHF. EF 30-35%.  Patient is s/p AVR for severe AS and CABG for severe multivessel CAD. Prolonged hospital course due to persistent CHF with low output.  - clinically is doing well. euvolemic - intolerant of GDMT due to low BP. Currently just on lasix.  -daily weight - sodium and fluid restriction. - poor candidate for SGLT-2 inhibitor due to urinary retention. - last Echo showed improved EF 40-45%    2. Severe low flow AS.  - s/p AVR. Follow up Echo showed  good valve function.    3. CAD severe s/p CABG. No active angina.   4. Afib. S/p DCCV in April but ERAD with Atrial flutter. Rate is controlled. On Eliquis.    5.  CKD. Stage 3a  6. Advanced prostate CA on Orgovyx now.    Follow up in 6 months.  Signed, Khadar Monger [place], MD  03/07/2023 3:23 PM    [facility] [address], [place], [place], [address] Phone [phone], Fax [phone]

## 2023-03-04 DIAGNOSIS — R339 Retention of urine, unspecified: Secondary | ICD-10-CM | POA: Diagnosis not present

## 2023-03-07 ENCOUNTER — Encounter: Payer: Self-pay | Admitting: Cardiology

## 2023-03-07 ENCOUNTER — Ambulatory Visit: Payer: Medicare HMO | Attending: Cardiology | Admitting: Cardiology

## 2023-03-07 VITALS — BP 100/67 | HR 60 | Ht 68.0 in | Wt 161.8 lb

## 2023-03-07 DIAGNOSIS — Z952 Presence of prosthetic heart valve: Secondary | ICD-10-CM | POA: Diagnosis not present

## 2023-03-07 DIAGNOSIS — N1831 Chronic kidney disease, stage 3a: Secondary | ICD-10-CM | POA: Diagnosis not present

## 2023-03-07 DIAGNOSIS — I5042 Chronic combined systolic (congestive) and diastolic (congestive) heart failure: Secondary | ICD-10-CM | POA: Diagnosis not present

## 2023-03-07 DIAGNOSIS — I484 Atypical atrial flutter: Secondary | ICD-10-CM | POA: Diagnosis not present

## 2023-03-07 DIAGNOSIS — Z951 Presence of aortocoronary bypass graft: Secondary | ICD-10-CM

## 2023-03-07 NOTE — Patient Instructions (Signed)
Medication Instructions:  Continue same medications *If you need a refill on your cardiac medications before your next appointment, please call your pharmacy*   Lab Work: None ordered   Testing/Procedures: None ordered   Follow-Up: At [facility], you and your health needs are our priority.  As part of our continuing mission to provide you with exceptional heart care, we have created designated Provider Care Teams.  These Care Teams include your primary Cardiologist (physician) and Advanced Practice Providers (APPs -  Physician Assistants and Nurse Practitioners) who all work together to provide you with the care you need, when you need it.  We recommend signing up for the patient portal called "MyChart".  Sign up information is provided on this After Visit Summary.  MyChart is used to connect with patients for Virtual Visits (Telemedicine).  Patients are able to view lab/test results, encounter notes, upcoming appointments, etc.  Non-urgent messages can be sent to your provider as well.   To learn more about what you can do with MyChart, go to [email or web address].    Your next appointment:  6 months    Call in Jan to schedule March appointment     Provider:  Dr.Jordan

## 2023-03-14 DIAGNOSIS — E782 Mixed hyperlipidemia: Secondary | ICD-10-CM | POA: Diagnosis not present

## 2023-03-14 DIAGNOSIS — C61 Malignant neoplasm of prostate: Secondary | ICD-10-CM | POA: Diagnosis not present

## 2023-03-14 DIAGNOSIS — F325 Major depressive disorder, single episode, in full remission: Secondary | ICD-10-CM | POA: Diagnosis not present

## 2023-03-14 DIAGNOSIS — C4492 Squamous cell carcinoma of skin, unspecified: Secondary | ICD-10-CM | POA: Diagnosis not present

## 2023-03-14 DIAGNOSIS — I5042 Chronic combined systolic (congestive) and diastolic (congestive) heart failure: Secondary | ICD-10-CM | POA: Diagnosis not present

## 2023-03-14 DIAGNOSIS — R5381 Other malaise: Secondary | ICD-10-CM | POA: Diagnosis not present

## 2023-03-14 DIAGNOSIS — I700 Atherosclerosis of aorta: Secondary | ICD-10-CM | POA: Diagnosis not present

## 2023-03-14 DIAGNOSIS — Z23 Encounter for immunization: Secondary | ICD-10-CM | POA: Diagnosis not present

## 2023-03-14 DIAGNOSIS — I484 Atypical atrial flutter: Secondary | ICD-10-CM | POA: Diagnosis not present

## 2023-03-14 DIAGNOSIS — Z6824 Body mass index (BMI) 24.0-24.9, adult: Secondary | ICD-10-CM | POA: Diagnosis not present

## 2023-03-14 DIAGNOSIS — C7989 Secondary malignant neoplasm of other specified sites: Secondary | ICD-10-CM | POA: Diagnosis not present

## 2023-03-14 DIAGNOSIS — K5190 Ulcerative colitis, unspecified, without complications: Secondary | ICD-10-CM | POA: Diagnosis not present

## 2023-04-11 DIAGNOSIS — Z23 Encounter for immunization: Secondary | ICD-10-CM | POA: Diagnosis not present

## 2023-04-11 DIAGNOSIS — Z6824 Body mass index (BMI) 24.0-24.9, adult: Secondary | ICD-10-CM | POA: Diagnosis not present

## 2023-04-11 DIAGNOSIS — Z0000 Encounter for general adult medical examination without abnormal findings: Secondary | ICD-10-CM | POA: Diagnosis not present

## 2023-04-11 DIAGNOSIS — Z1331 Encounter for screening for depression: Secondary | ICD-10-CM | POA: Diagnosis not present

## 2023-04-11 DIAGNOSIS — Z9181 History of falling: Secondary | ICD-10-CM | POA: Diagnosis not present

## 2023-04-20 DIAGNOSIS — L929 Granulomatous disorder of the skin and subcutaneous tissue, unspecified: Secondary | ICD-10-CM | POA: Diagnosis not present

## 2023-05-02 DIAGNOSIS — R339 Retention of urine, unspecified: Secondary | ICD-10-CM | POA: Diagnosis not present

## 2023-05-04 DIAGNOSIS — L929 Granulomatous disorder of the skin and subcutaneous tissue, unspecified: Secondary | ICD-10-CM | POA: Diagnosis not present

## 2023-05-16 ENCOUNTER — Encounter (HOSPITAL_COMMUNITY): Payer: Self-pay

## 2023-05-16 ENCOUNTER — Encounter (HOSPITAL_COMMUNITY): Payer: Medicare HMO | Admitting: Internal Medicine

## 2023-05-16 ENCOUNTER — Ambulatory Visit (HOSPITAL_COMMUNITY)
Admission: RE | Admit: 2023-05-16 | Discharge: 2023-05-16 | Disposition: A | Discharge status: Home / Self Care | Payer: Medicare HMO | Source: Ambulatory Visit | Attending: Adult Health | Admitting: Adult Health

## 2023-05-16 VITALS — BP 120/84 | HR 66 | Wt 166.0 lb

## 2023-05-16 DIAGNOSIS — Z951 Presence of aortocoronary bypass graft: Secondary | ICD-10-CM | POA: Insufficient documentation

## 2023-05-16 DIAGNOSIS — I130 Hypertensive heart and chronic kidney disease with heart failure and stage 1 through stage 4 chronic kidney disease, or unspecified chronic kidney disease: Secondary | ICD-10-CM | POA: Insufficient documentation

## 2023-05-16 DIAGNOSIS — R339 Retention of urine, unspecified: Secondary | ICD-10-CM | POA: Insufficient documentation

## 2023-05-16 DIAGNOSIS — I350 Nonrheumatic aortic (valve) stenosis: Secondary | ICD-10-CM | POA: Insufficient documentation

## 2023-05-16 DIAGNOSIS — R5383 Other fatigue: Secondary | ICD-10-CM | POA: Insufficient documentation

## 2023-05-16 DIAGNOSIS — I5042 Chronic combined systolic (congestive) and diastolic (congestive) heart failure: Secondary | ICD-10-CM

## 2023-05-16 DIAGNOSIS — I459 Conduction disorder, unspecified: Secondary | ICD-10-CM | POA: Insufficient documentation

## 2023-05-16 DIAGNOSIS — Z7982 Long term (current) use of aspirin: Secondary | ICD-10-CM | POA: Diagnosis not present

## 2023-05-16 DIAGNOSIS — N1831 Chronic kidney disease, stage 3a: Secondary | ICD-10-CM | POA: Diagnosis not present

## 2023-05-16 DIAGNOSIS — Z7901 Long term (current) use of anticoagulants: Secondary | ICD-10-CM | POA: Insufficient documentation

## 2023-05-16 DIAGNOSIS — Z79899 Other long term (current) drug therapy: Secondary | ICD-10-CM | POA: Diagnosis not present

## 2023-05-16 DIAGNOSIS — C61 Malignant neoplasm of prostate: Secondary | ICD-10-CM | POA: Insufficient documentation

## 2023-05-16 DIAGNOSIS — I4891 Unspecified atrial fibrillation: Secondary | ICD-10-CM | POA: Insufficient documentation

## 2023-05-16 DIAGNOSIS — I2510 Atherosclerotic heart disease of native coronary artery without angina pectoris: Secondary | ICD-10-CM | POA: Diagnosis not present

## 2023-05-16 DIAGNOSIS — I4892 Unspecified atrial flutter: Secondary | ICD-10-CM | POA: Diagnosis not present

## 2023-05-16 DIAGNOSIS — I5082 Biventricular heart failure: Secondary | ICD-10-CM | POA: Diagnosis not present

## 2023-05-16 LAB — BASIC METABOLIC PANEL
Anion gap: 10 (ref 5–15)
BUN: 26 mg/dL — ABNORMAL HIGH (ref 8–23)
CO2: 27 mmol/L (ref 22–32)
Calcium: 9.2 mg/dL (ref 8.9–10.3)
Chloride: 102 mmol/L (ref 98–111)
Creatinine, Ser: 1.34 mg/dL — ABNORMAL HIGH (ref 0.61–1.24)
GFR, Estimated: 49 mL/min — ABNORMAL LOW (ref >60)
Glucose, Bld: 150 mg/dL — ABNORMAL HIGH (ref 70–99)
Potassium: 4.3 mmol/L (ref 3.5–5.1)
Sodium: 139 mmol/L (ref 135–145)

## 2023-05-16 LAB — CBC
HCT: 38.3 % — ABNORMAL LOW (ref 39.0–52.0)
Hemoglobin: 12.6 g/dL — ABNORMAL LOW (ref 13.0–17.0)
MCH: 32.1 pg (ref 26.0–34.0)
MCHC: 32.9 g/dL (ref 30.0–36.0)
MCV: 97.7 fL (ref 80.0–100.0)
Platelets: 130 10*3/uL — ABNORMAL LOW (ref 150–400)
RBC: 3.92 MIL/uL — ABNORMAL LOW (ref 4.22–5.81)
RDW: 13.2 % (ref 11.5–15.5)
WBC: 5.9 10*3/uL (ref 4.0–10.5)
nRBC: 0 % (ref 0.0–0.2)

## 2023-05-16 NOTE — Progress Notes (Signed)
Advanced Heart Failure Clinic Note   PCP: Gweneth Dimitri, MD PCP-Cardiologist: Peter [place], MD  HF Cardiologist: Dr. Gala Romney  HPI:  [name] [name] is a 88 y/o male, previously very active despite age before recent illness. Had been working 20-30 hr/week for [employer] and [employer].    Admitted in 11/21 with progressive decline in EF to 30-35% w/ global HK. RV systolic function mildly reduced. Also noted to have mod-severe AS.  Had subsequent R/LHC showing severe 3V CAD.    Underwent CABG x 4 (LIMA-LAD, sequential SVG-OM1 and OM2, SVG- PDA) + tissue AVR by Dr. Laneta Simmers on 05/23/20. Post operative course c/b atrial fibrillation treated w/ amiodarone + difficulty weaning inotropes, fluid overload and hypotension. AHF consult to assist with management. He was discharged to [facility] 06/26/20. His discharge weight was 168 lbs.  Sleep study 10/23 AHI 13 Decided against CPAP.  Echo 9/23 EF 40-45%, RV mod reduced, mod MR, mild-mod TR, trivial AR, normal structure and function of the aortic valve prosthesis.  10/07/22 underwent elective DCCV w/ conversion to sinus rhythm with marked 1 AVB with episodes of 2nd degree (type I HB). Unfortunately, he had early recurrence. He was seen in the ED on 4/27 for bleeding issues and EKG showed afib w/ CVR.   Since then, he has been diagnosed w/ advanced prostate cancer. He is being treated w/ Orgovyx.  He presents today for routine f/u. Here w/ his wife. Getting around ok. Ambulates slowly w/ a cane. Able to get around the house w/o significant dyspnea but notes fatigue, typically worse first thing in the morning. Hard to get going. Denies CP. Denies wt gain. No gross bleeding. BP soft at home. Usually in the low 100s systolic. Occasional systolic's in the 90s. EKG today shows Afib w/ SVR in the 50s. Denies syncope/ near syncope. Reports full med compliance.        ROS: All systems reviewed and negative except as per HPI.   Past Medical History:   Diagnosis Date   Aortic stenosis    Arthritis    CAD (coronary artery disease)    CHF (congestive heart failure) (HCC)    Colitis    Edema    Essential hypertension    no med now   Essential tremor    Hyperlipidemia    Hypotonic bladder    L4-L5   Obstructive uropathy    Peripheral neuropathy    Elevated by Dr. love 2010   Prostate cancer [facility]) 2005    Current Outpatient Medications  Medication Sig Dispense Refill   apixaban (ELIQUIS) 5 MG TABS tablet Take 1 tablet (5 mg total) by mouth 2 (two) times daily. 180 tablet 3   ascorbic acid (VITAMIN C) 500 MG tablet Take 500 mg by mouth daily.     aspirin 81 MG tablet Take 81 mg by mouth at bedtime.      B Complex Vitamins (B COMPLEX 100 PO) Take 1 tablet by mouth daily.     Biotin 5000 MCG TABS Take 5,000 mcg by mouth daily.     Calcium Carb-Cholecalciferol (CALCIUM + VITAMIN D3 PO) Take 1 tablet by mouth daily.     cholecalciferol (VITAMIN D3) 25 MCG (1000 UNIT) tablet Take 1,000 Units by mouth daily.     Coenzyme Q10 (CO Q-10) 100 MG CAPS Take 100 mg by mouth daily.     docusate sodium (COLACE) 100 MG capsule Take 100 mg by mouth daily.     dutasteride (AVODART) 0.5 MG capsule Take  0.5 mg by mouth daily.     feeding supplement, ENSURE COMPLETE, (ENSURE COMPLETE) LIQD Take 237 mLs by mouth daily.     ferrous sulfate 325 (65 FE) MG tablet Take 325 mg by mouth daily.     furosemide (LASIX) 40 MG tablet Take 1 tablet by mouth twice daily (Patient taking differently: Take 40 mg by mouth daily.) 180 tablet 3   magnesium oxide (MAG-OX) 400 (240 Mg) MG tablet Take 400 mg by mouth daily.     Methylcellulose, Laxative, (FIBER THERAPY PO) Take 2 capsules by mouth daily.     mirtazapine (REMERON) 7.5 MG tablet Take 7.5 mg by mouth at bedtime.     Misc Natural Products (JOINT HEALTH PO) Take 1 tablet by mouth daily.     Misc Natural Products (PROSTATE COMPLETE PO) Take 1 capsule by mouth daily.     Multiple Vitamins-Minerals (CENTRUM SILVER  PO) Take 1 tablet by mouth daily.      Multiple Vitamins-Minerals (LUTEIN-ZEAXANTHIN PO) Take 1 tablet by mouth daily.     ORGOVYX 120 MG tablet Take 120 mg by mouth daily.     OVER THE COUNTER MEDICATION Take 1 capsule by mouth daily. Neuriva     Potassium 99 MG TABS Take 99 mg by mouth daily.     Saw Palmetto 450 MG CAPS Take 450 mg by mouth daily.     sennosides-docusate sodium (SENOKOT-S) 8.6-50 MG tablet Take 2 tablets by mouth daily.     sulfaSALAzine (AZULFIDINE) 500 MG tablet Take 2,000 mg by mouth daily.     tamsulosin (FLOMAX) 0.4 MG CAPS Take 0.4 mg by mouth 2 (two) times daily.      TURMERIC CURCUMIN PO Take 1,500 mg by mouth daily.     No current facility-administered medications for this encounter.   Allergies  Allergen Reactions   Simvastatin Other (See Comments)    "caused neuropathy pain"    Social History   Socioeconomic History   Marital status: Married    Spouse name: Not on file   Number of children: Not on file   Years of education: 18   Highest education level: Master's degree (e.g., MA, MS, MEng, MEd, MSW, MBA)  Occupational History   Not on file  Tobacco Use   Smoking status: Never   Smokeless tobacco: Never  Vaping Use   Vaping status: Never Used  Substance and Sexual Activity   Alcohol use: Yes    Alcohol/week: 1.0 standard drink of alcohol    Types: 1 Glasses of wine per week    Comment: occ   Drug use: No   Sexual activity: Yes  Other Topics Concern   Not on file  Social History Narrative   Not on file   Social Determinants of Health   Financial Resource Strain: Not on file  Food Insecurity: No Food Insecurity (07/30/2020)   Hunger Vital Sign    Worried About Running Out of Food in the Last Year: Never true    Ran Out of Food in the Last Year: Never true  Transportation Needs: No Transportation Needs (07/30/2020)   PRAPARE - Administrator, Civil Service (Medical): No    Lack of Transportation (Non-Medical): No  Physical  Activity: Not on file  Stress: Not on file  Social Connections: Not on file  Intimate Partner Violence: Not on file   Family History  Problem Relation Age of Onset   Hypertension Mother    Heart failure Mother    Diabetes  Mother    Heart attack Brother    Heart attack Maternal Aunt    Vitals:   05/16/23 1450  BP: 120/84  Pulse: 66  SpO2: 97%  Weight: 75.3 kg (166 lb)     Wt Readings from Last 3 Encounters:  05/16/23 75.3 kg (166 lb)  03/07/23 73.4 kg (161 lb 12.8 oz)  10/23/22 73.5 kg (162 lb)   PHYSICAL EXAM: General:  Well appearing, elderly male. No respiratory difficulty HEENT: normal Neck: supple. no JVD. Carotids 2+ bilat; no bruits. No lymphadenopathy or thyromegaly appreciated. Cor: PMI nondisplaced. Regular rate & rhythm. No rubs, gallops or murmurs. Lungs: clear Abdomen: soft, nontender, nondistended. No hepatosplenomegaly. No bruits or masses. Good bowel sounds. Extremities: no cyanosis, clubbing, rash, edema Neuro: alert & oriented x 3, cranial nerves grossly intact. moves all 4 extremities w/o difficulty. Affect pleasant.   ASSESSMENT & PLAN:  1. Severe 3V CAD: - s/p CABGx 4 + AVR (LIMA-LAD, sequential SVG-OM1 and OM2, SVG- PDA) in 11/21 - denies CP  - Continue ASA 81 mg daily - Not on b-blokcer due to fatigue - Refuses statin   2. Aortic Stenosis, s/p tissue AVR (05/23/20)                       - AV prosthesis ok on echo. - aware of SBE prophylaxis   3.Chronic Biventricular Heart Failure:  - Echo in 2014 showed normal LVEF 55-60% w/ G1DD.  - Echo 10/21 and EF 30-35% w/ global HK. RV systolic function mildly reduced. - LHC this admit w/ severe 3V CAD>>ICM. Now s/p CABG. Also w/ severe AS, possibly contributing to LV dysfunction, now s/p SAVR.  - Post-op echo 12/1 EF 30-35%, RV mildly reduced. AV prothesis ok. - Portable echo (1/22): EF 30-35%  - 5/22 ECHO EF 35-40%, mild-mod MR, Moderately reduced RV function  - Echo 9/23 EF 40-45%, RV mod  reduced, mod MR, mild-mod TR, trivial AR, normal structure and function of the aortic valve prosthesis - NYHA Class II-III, more fatigue > dyspnea. Confounded by age.   - euvolemic on exam  - continue Lasix 40 mg daily. Check BMP/BNP today  - Off losartan due to hypotension  - Off digoxin with heart block.  - No beta blocker with AV block - Not good SGLT2i candidate given risk for GU infections, self caths  - Not on spiro with low BP, SBP 90-100s at home    4. Atrial Fibrillation/Flutter, chronic  - failed attempted DCCV 4/24 w/ early recurrence  - EKG today afib w/ SVR 53 bpm  - Continue Eliquis 5 mg bid. Denies abnormal bleeding. Check CBC today    5. CKD IIIa - baseline SCr ~1.2  - check BMP today    6. Urinary Retention - self caths 3x daily  7. Snoring/fatigue - Sleep study 10/23 AHI 13 Decided against CPAP  8. Prostate Cancer - now on Orgovyx  F/u in 3 months w/ Dr. Aquilla Solian, PA-C  5:05 PM

## 2023-05-16 NOTE — Patient Instructions (Signed)
Medication Changes:  No Changes In Medications at this time.   Lab Work:  Labs done today, your results will be available in MyChart, we will contact you for abnormal readings.  Follow-Up in: 3 months with Dr. Gala Romney PLEASE CALL OUR OFFICE AROUND DECEMBER TO GET SCHEDULED FOR YOUR APPOINTMENT. PHONE NUMBER IS [phone] OPTION 2   At the Advanced [facility] Failure Clinic, you and your health needs are our priority. We have a designated team specialized in the treatment of Heart Failure. This Care Team includes your primary Heart Failure Specialized Cardiologist (physician), Advanced Practice Providers (APPs- Physician Assistants and Nurse Practitioners), and Pharmacist who all work together to provide you with the care you need, when you need it.   You may see any of the following providers on your designated Care Team at your next follow up:  Dr. Arvilla Meres Dr. Marca Ancona Dr. Dorthula Nettles Dr. Theresia Bough Tonye Becket, NP Robbie Lis, [place] [facility] [place], [place] Brynda Peon, NP [place] Lee, NP Karle Plumber, PharmD   Please be sure to bring in all your medications bottles to every appointment.   Need to Contact us:  If you have any questions or concerns before your next appointment please send [place] a message through [place] or call our office at [phone].    TO LEAVE A MESSAGE FOR THE NURSE SELECT OPTION 2, PLEASE LEAVE A MESSAGE INCLUDING: YOUR NAME DATE OF BIRTH CALL BACK NUMBER REASON FOR CALL**this is important as we prioritize the call backs  YOU WILL RECEIVE A CALL BACK THE SAME DAY AS LONG AS YOU CALL BEFORE 4:00 PM

## 2023-05-17 DIAGNOSIS — C61 Malignant neoplasm of prostate: Secondary | ICD-10-CM | POA: Diagnosis not present

## 2023-05-18 DIAGNOSIS — Z5189 Encounter for other specified aftercare: Secondary | ICD-10-CM | POA: Diagnosis not present

## 2023-05-19 DIAGNOSIS — Z0100 Encounter for examination of eyes and vision without abnormal findings: Secondary | ICD-10-CM | POA: Diagnosis not present

## 2023-05-30 DIAGNOSIS — C44629 Squamous cell carcinoma of skin of left upper limb, including shoulder: Secondary | ICD-10-CM | POA: Diagnosis not present

## 2023-05-30 DIAGNOSIS — D485 Neoplasm of uncertain behavior of skin: Secondary | ICD-10-CM | POA: Diagnosis not present

## 2023-05-30 DIAGNOSIS — L570 Actinic keratosis: Secondary | ICD-10-CM | POA: Diagnosis not present

## 2023-05-30 DIAGNOSIS — C44622 Squamous cell carcinoma of skin of right upper limb, including shoulder: Secondary | ICD-10-CM | POA: Diagnosis not present

## 2023-06-16 DIAGNOSIS — C44629 Squamous cell carcinoma of skin of left upper limb, including shoulder: Secondary | ICD-10-CM | POA: Diagnosis not present

## 2023-06-21 DIAGNOSIS — R339 Retention of urine, unspecified: Secondary | ICD-10-CM | POA: Diagnosis not present

## 2023-06-30 DIAGNOSIS — Z6824 Body mass index (BMI) 24.0-24.9, adult: Secondary | ICD-10-CM | POA: Diagnosis not present

## 2023-06-30 DIAGNOSIS — R058 Other specified cough: Secondary | ICD-10-CM | POA: Diagnosis not present

## 2023-07-13 DIAGNOSIS — C44622 Squamous cell carcinoma of skin of right upper limb, including shoulder: Secondary | ICD-10-CM | POA: Diagnosis not present

## 2023-07-13 DIAGNOSIS — L905 Scar conditions and fibrosis of skin: Secondary | ICD-10-CM | POA: Diagnosis not present

## 2023-08-03 DIAGNOSIS — L089 Local infection of the skin and subcutaneous tissue, unspecified: Secondary | ICD-10-CM | POA: Diagnosis not present

## 2023-08-03 DIAGNOSIS — D485 Neoplasm of uncertain behavior of skin: Secondary | ICD-10-CM | POA: Diagnosis not present

## 2023-08-03 DIAGNOSIS — H353132 Nonexudative age-related macular degeneration, bilateral, intermediate dry stage: Secondary | ICD-10-CM | POA: Diagnosis not present

## 2023-08-03 DIAGNOSIS — C44529 Squamous cell carcinoma of skin of other part of trunk: Secondary | ICD-10-CM | POA: Diagnosis not present

## 2023-08-03 DIAGNOSIS — Z961 Presence of intraocular lens: Secondary | ICD-10-CM | POA: Diagnosis not present

## 2023-08-04 ENCOUNTER — Ambulatory Visit (HOSPITAL_COMMUNITY)
Admission: RE | Admit: 2023-08-04 | Discharge: 2023-08-04 | Disposition: A | Discharge status: Home / Self Care | Payer: Medicare HMO | Source: Ambulatory Visit | Attending: Internal Medicine | Admitting: Internal Medicine

## 2023-08-04 ENCOUNTER — Encounter (HOSPITAL_COMMUNITY): Payer: Self-pay | Admitting: Internal Medicine

## 2023-08-04 VITALS — BP 92/60 | HR 66 | Ht 69.0 in | Wt 155.8 lb

## 2023-08-04 DIAGNOSIS — I5082 Biventricular heart failure: Secondary | ICD-10-CM | POA: Insufficient documentation

## 2023-08-04 DIAGNOSIS — Z952 Presence of prosthetic heart valve: Secondary | ICD-10-CM | POA: Diagnosis not present

## 2023-08-04 DIAGNOSIS — Z951 Presence of aortocoronary bypass graft: Secondary | ICD-10-CM | POA: Diagnosis not present

## 2023-08-04 DIAGNOSIS — I4819 Other persistent atrial fibrillation: Secondary | ICD-10-CM | POA: Diagnosis not present

## 2023-08-04 DIAGNOSIS — I482 Chronic atrial fibrillation, unspecified: Secondary | ICD-10-CM | POA: Insufficient documentation

## 2023-08-04 DIAGNOSIS — G4733 Obstructive sleep apnea (adult) (pediatric): Secondary | ICD-10-CM | POA: Diagnosis not present

## 2023-08-04 DIAGNOSIS — Z7901 Long term (current) use of anticoagulants: Secondary | ICD-10-CM | POA: Diagnosis not present

## 2023-08-04 DIAGNOSIS — N1831 Chronic kidney disease, stage 3a: Secondary | ICD-10-CM | POA: Diagnosis not present

## 2023-08-04 DIAGNOSIS — R339 Retention of urine, unspecified: Secondary | ICD-10-CM | POA: Diagnosis not present

## 2023-08-04 DIAGNOSIS — I350 Nonrheumatic aortic (valve) stenosis: Secondary | ICD-10-CM | POA: Insufficient documentation

## 2023-08-04 DIAGNOSIS — R0683 Snoring: Secondary | ICD-10-CM | POA: Diagnosis not present

## 2023-08-04 DIAGNOSIS — I4892 Unspecified atrial flutter: Secondary | ICD-10-CM | POA: Diagnosis not present

## 2023-08-04 DIAGNOSIS — I5022 Chronic systolic (congestive) heart failure: Secondary | ICD-10-CM | POA: Insufficient documentation

## 2023-08-04 DIAGNOSIS — Z7982 Long term (current) use of aspirin: Secondary | ICD-10-CM | POA: Insufficient documentation

## 2023-08-04 DIAGNOSIS — I2510 Atherosclerotic heart disease of native coronary artery without angina pectoris: Secondary | ICD-10-CM | POA: Insufficient documentation

## 2023-08-04 DIAGNOSIS — I480 Paroxysmal atrial fibrillation: Secondary | ICD-10-CM | POA: Diagnosis not present

## 2023-08-04 DIAGNOSIS — C61 Malignant neoplasm of prostate: Secondary | ICD-10-CM | POA: Insufficient documentation

## 2023-08-04 DIAGNOSIS — I130 Hypertensive heart and chronic kidney disease with heart failure and stage 1 through stage 4 chronic kidney disease, or unspecified chronic kidney disease: Secondary | ICD-10-CM | POA: Insufficient documentation

## 2023-08-04 MED ORDER — APIXABAN 2.5 MG PO TABS
2.5000 mg | ORAL_TABLET | Freq: Two times a day (BID) | ORAL | 1 refills | Status: DC
Start: 2023-08-04 — End: 2024-01-30

## 2023-08-04 MED ORDER — MIDODRINE HCL 2.5 MG PO TABS: 2.5000 mg | ORAL_TABLET | Freq: Two times a day (BID) | ORAL | 1 refills | Status: DC

## 2023-08-04 NOTE — Progress Notes (Signed)
 Advanced Heart Failure Clinic Note   PCP: Aisha Harvey, MD PCP-Cardiologist: Peter Jordan, MD  HF Cardiologist: Dr. Cherrie  HPI:  [name] is a 89 y/o male, previously very active despite age before recent illness. Had been working 20-30 hr/week for [employer] and [employer].    Admitted in 11/21 with progressive decline in EF to 30-35% w/ global HK. RV systolic function mildly reduced. Also noted to have mod-severe AS.  Had subsequent R/LHC showing severe 3V CAD.    Underwent CABG x 4 (LIMA-LAD, sequential SVG-OM1 and OM2, SVG- PDA) + tissue AVR by Dr. Lucas on 05/23/20. Post operative course c/b atrial fibrillation treated w/ amiodarone  + difficulty weaning inotropes, fluid overload and hypotension. AHF consult to assist with management. He was discharged to [facility] 06/26/20. His discharge weight was 168 lbs.  Sleep study 10/23 AHI 13 Decided against CPAP.  Echo 9/23 EF 40-45%, RV mod reduced, mod MR, mild-mod TR, trivial AR, normal structure and function of the aortic valve prosthesis.  10/07/22 underwent elective DCCV w/ conversion to sinus rhythm with marked 1 AVB with episodes of 2nd degree (type I HB). Unfortunately, he had early recurrence. He was seen in the ED on 4/27 for bleeding issues and EKG showed afib w/ CVR.   Has advanced prostate cancer. He is being treated w/ Orgovyx.  He presents today for routine f/u. Here w/ his wife. Says he is ok. Comfortable. Has been losing weight. Now down 10-15 pounds. No SOB, CP or edema. Does ADLs. BP running in 90s. On Eliquis  and ASA 81. No significant bleeds    ROS: All systems reviewed and negative except as per HPI.   Past Medical History:  Diagnosis Date   Aortic stenosis    Arthritis    CAD (coronary artery disease)    CHF (congestive heart failure) (HCC)    Colitis    Edema    Essential hypertension    no med now   Essential tremor    Hyperlipidemia    Hypotonic bladder    L4-L5   Obstructive  uropathy    Peripheral neuropathy    Elevated by Dr. love 2010   Prostate cancer [facility]) 2005    Current Outpatient Medications  Medication Sig Dispense Refill   apixaban  (ELIQUIS ) 5 MG TABS tablet Take 1 tablet (5 mg total) by mouth 2 (two) times daily. 180 tablet 3   ascorbic acid (VITAMIN C) 500 MG tablet Take 500 mg by mouth daily.     aspirin  81 MG tablet Take 81 mg by mouth at bedtime.      B Complex Vitamins (B COMPLEX 100 PO) Take 1 tablet by mouth daily.     Biotin 5000 MCG TABS Take 5,000 mcg by mouth daily.     Calcium  Carb-Cholecalciferol (CALCIUM  + VITAMIN D3 PO) Take 1 tablet by mouth daily.     cholecalciferol (VITAMIN D3) 25 MCG (1000 UNIT) tablet Take 1,000 Units by mouth daily.     Coenzyme Q10 (CO Q-10) 100 MG CAPS Take 100 mg by mouth daily.     dutasteride  (AVODART ) 0.5 MG capsule Take 0.5 mg by mouth daily.     feeding supplement, ENSURE COMPLETE, (ENSURE COMPLETE) LIQD Take 237 mLs by mouth daily.     ferrous sulfate 325 (65 FE) MG tablet Take 325 mg by mouth daily.     furosemide  (LASIX ) 40 MG tablet Take 1 tablet by mouth twice daily (Patient taking differently: Take 40 mg by mouth daily.) 180  tablet 3   magnesium  oxide (MAG-OX) 400 (240 Mg) MG tablet Take 400 mg by mouth daily.     Methylcellulose, Laxative, (FIBER THERAPY PO) Take 2 capsules by mouth daily.     mirtazapine  (REMERON ) 7.5 MG tablet Take 7.5 mg by mouth at bedtime.     Multiple Vitamins-Minerals (CENTRUM SILVER PO) Take 1 tablet by mouth daily.      Multiple Vitamins-Minerals (LUTEIN-ZEAXANTHIN PO) Take 1 tablet by mouth daily.     ORGOVYX 120 MG tablet Take 120 mg by mouth daily.     OVER THE COUNTER MEDICATION Take 1 capsule by mouth daily. Neuriva     Potassium 99 MG TABS Take 99 mg by mouth daily.     Saw Palmetto  450 MG CAPS Take 450 mg by mouth daily.     sennosides-docusate sodium  (SENOKOT-S) 8.6-50 MG tablet Take 2 tablets by mouth daily.     sulfaSALAzine  (AZULFIDINE ) 500 MG tablet Take  2,000 mg by mouth daily.     tamsulosin  (FLOMAX ) 0.4 MG CAPS Take 0.4 mg by mouth 2 (two) times daily.      TURMERIC CURCUMIN PO Take 1,500 mg by mouth daily.     docusate sodium  (COLACE) 100 MG capsule Take 100 mg by mouth daily. (Patient not taking: Reported on 08/04/2023)     Misc Natural Products (JOINT HEALTH PO) Take 1 tablet by mouth daily.     Misc Natural Products (PROSTATE COMPLETE PO) Take 1 capsule by mouth daily.     No current facility-administered medications for this encounter.   Allergies  Allergen Reactions   Simvastatin Other (See Comments)    caused neuropathy pain    Social History   Socioeconomic History   Marital status: Married    Spouse name: Not on file   Number of children: Not on file   Years of education: 18   Highest education level: Master's degree (e.g., MA, MS, MEng, MEd, MSW, MBA)  Occupational History   Not on file  Tobacco Use   Smoking status: Never   Smokeless tobacco: Never  Vaping Use   Vaping status: Never Used  Substance and Sexual Activity   Alcohol  use: Yes    Alcohol /week: 1.0 standard drink of alcohol     Types: 1 Glasses of wine per week    Comment: occ   Drug use: No   Sexual activity: Yes  Other Topics Concern   Not on file  Social History Narrative   Not on file   Social Drivers of Health   Financial Resource Strain: Not on file  Food Insecurity: No Food Insecurity (07/30/2020)   Hunger Vital Sign    Worried About Running Out of Food in the Last Year: Never true    Ran Out of Food in the Last Year: Never true  Transportation Needs: No Transportation Needs (07/30/2020)   PRAPARE - Administrator, Civil Service (Medical): No    Lack of Transportation (Non-Medical): No  Physical Activity: Not on file  Stress: Not on file  Social Connections: Not on file  Intimate Partner Violence: Not on file   Family History  Problem Relation Age of Onset   Hypertension Mother    Heart failure Mother    Diabetes Mother     Heart attack Brother    Heart attack Maternal Aunt    Vitals:   08/04/23 1519  BP: 92/60  Pulse: 66  SpO2: 91%  Weight: 70.7 kg (155 lb 12.8 oz)  Height: 5' 9 (  1.753 m)      Wt Readings from Last 3 Encounters:  08/04/23 70.7 kg (155 lb 12.8 oz)  05/16/23 75.3 kg (166 lb)  03/07/23 73.4 kg (161 lb 12.8 oz)   PHYSICAL EXAM: General:  Elderly. Thin No resp difficulty but grunts at times HEENT: normal Neck: supple. no JVD. Carotids 2+ bilat; no bruits. No lymphadenopathy or thryomegaly appreciated. Cor: PMI nondisplaced. Irregular rate & rhythm.2/6 SEM RUSB Lungs: clear Abdomen: soft, nontender, nondistended. No hepatosplenomegaly. No bruits or masses. Good bowel sounds. Extremities: no cyanosis, clubbing, rash, edema Neuro: alert & orientedx3, cranial nerves grossly intact. moves all 4 extremities w/o difficulty. Affect pleasant   ASSESSMENT & PLAN:  1. Chronic Biventricular Heart Failure:  - Echo in 2014 showed normal LVEF 55-60% w/ G1DD.  - Echo 10/21 and EF 30-35% w/ global HK. RV systolic function mildly reduced. - LHC this admit w/ severe 3V CAD>>ICM. Now s/p CABG. Also w/ severe AS, possibly contributing to LV dysfunction, now s/p SAVR.  - Post-op echo 12/1 EF 30-35%, RV mildly reduced. AV prothesis ok. - Portable echo (1/22): EF 30-35%  - 5/22 ECHO EF 35-40%, mild-mod MR, Moderately reduced RV function  - Echo 9/23 EF 40-45%, RV mod reduced, mod MR, mild-mod TR, trivial AR, normal structure and function of the aortic valve prosthesis - Stable NYHA II  - Volume status ok - Continue lasix  40 daily  - Off losartan  due to hypotension  - Off digoxin  with heart block.  - No beta blocker with AV block - Not good SGLT2i candidate given risk for GU infections, self caths  - Not on spiro with low BP, SBP 90-100s at home - Will add back midodrine  2.5 bid to support BP. Goal SBP > 100 - Repeat echo next visit    2. Severe 3V CAD: - s/p CABGx 4 + AVR (LIMA-LAD,  sequential SVG-OM1 and OM2, SVG- PDA) in 11/21 - denies CP  - Not on b-blokcer due to fatigue - Has refused statin   3. Aortic Stenosis, s/p tissue AVR (05/23/20)                       - AV prosthesis ok on echo. - Continue ASA - Aware of SBE prophylaxis    4. Atrial Fibrillation/Flutter, chronic  - failed attempted DCCV 4/24 w/ early recurrence  - Remains in rate-controlled/slow AF - With ongoing weight loss drop Eliquis  to 2.5 bid    5. CKD IIIa - baseline SCr ~1.2  - recent labs reviewed Scr 1.34   6. Urinary Retention - self caths 3x daily  7. Snoring/fatigue - Sleep study 10/23 AHI 13 Decided against CPAP  8. Prostate Cancer, advanced - now on Orgovyx  9. Low BP - start midodrine  2.5 bid    Damisha Wolff, MD  3:31 PM

## 2023-08-04 NOTE — Patient Instructions (Addendum)
 Medication Changes:  DECREASE  ELQIUIS 2.5MG  BY MOUTH TWICE DAILY   START MIDODRINE  2.5MG  BY MOUTH TWICE DAILY    Follow-Up in: 6 MONTHS WITH BENSIMHON. GIVE OUR OFFICE A CALL IN JULY TO AUGUST APPOINTMENT.   At the Advanced [facility], you and your health needs are our priority. We have a designated team specialized in the treatment of Heart Failure. This Care Team includes your primary Heart Failure Specialized Cardiologist (physician), Advanced Practice Providers (APPs- Physician Assistants and Nurse Practitioners), and Pharmacist who all work together to provide you with the care you need, when you need it.   You may see any of the following providers on your designated Care Team at your next follow up:  Dr. Toribio Fuel Dr. Ezra Shuck Dr. Ria Commander Dr. Odis Brownie Greig Mosses, NP Caffie Shed, [place] [facility] [place], [place] Beckey Coe, NP Jordan Lee, NP Tinnie Redman, PharmD   Please be sure to bring in all your medications bottles to every appointment.   Need to Contact Us :  If you have any questions or concerns before your next appointment please send us  a message through [place] or call our office at [phone].    TO LEAVE A MESSAGE FOR THE NURSE SELECT OPTION 2, PLEASE LEAVE A MESSAGE INCLUDING: YOUR NAME DATE OF BIRTH CALL BACK NUMBER REASON FOR CALL**this is important as we prioritize the call backs  YOU WILL RECEIVE A CALL BACK THE SAME DAY AS LONG AS YOU CALL BEFORE 4:00 PM

## 2023-08-10 DIAGNOSIS — Z79899 Other long term (current) drug therapy: Secondary | ICD-10-CM | POA: Diagnosis not present

## 2023-09-20 DIAGNOSIS — L738 Other specified follicular disorders: Secondary | ICD-10-CM | POA: Diagnosis not present

## 2023-09-20 DIAGNOSIS — D485 Neoplasm of uncertain behavior of skin: Secondary | ICD-10-CM | POA: Diagnosis not present

## 2023-09-20 DIAGNOSIS — L308 Other specified dermatitis: Secondary | ICD-10-CM | POA: Diagnosis not present

## 2023-09-20 DIAGNOSIS — C44321 Squamous cell carcinoma of skin of nose: Secondary | ICD-10-CM | POA: Diagnosis not present

## 2023-09-23 NOTE — Progress Notes (Signed)
 Cardiology Office Note   Date:  09/27/2023   ID:  [name] [name], DOB [birth year], MRN [id number]  PCP:  [name] Dimitri, MD  Cardiologist:   [name] Bourassa [place], MD   Chief Complaint  Patient presents with   Atypical atrial flutter    Chronic combined systolic and diastolic congestive heart fa       History of Present Illness: [name] [name] is a 89 y.o. male who is seen for follow up CHF, AS. He has a history of venous insufficiency with chronic lower extremity edema,hypertension, hyperlipidemia, peripheral neuropathy, essential tremor, and prostate cancer.   Patient first seen  in 09/2012 for evaluation of dyspnea on exertion and lower extremity edema, especially in his left leg. Venous dopplers were ordered and showed no DVT. Echo showed LVEF of 55-60% with moderate asymmetric basal septal hypertrophy with no LV outflow tract gradient and mild to moderate MR. He was seen  in 07/2017 at which time he was doing well. He denied any chest pain or shortness of breath. He continued to report some lower extremity edema that resolved with elevation of legs at night. Edema felt to be due to venous insufficiency and conservative management with sodium restriction, elevation of legs, and compression stockings was recommended.    He was diagnosed with right middle lobe pneumonia on 10/12/2019 after presenting for further evaluation of fatigue while on vacation at the beach.. He tested negative for COVID. He was started on Levaquin but this had to be stopped when he developed tendonitis. He was started on Azithromycin instead on 10/26/2019. Repeat chest x-ray on 10/26/2019 showed increased density of right lower lobe pneumonia. He had persistent fatigue  and developed significant lower extremity edema and weight gain. Patient was seen by PCP again on 11/14/2019. Repeat chest x-ray showed continued right basilar pneumonia and increased right pleural effusion. BNP was checked and came back elevated at 1,380. He was  started on Lasix 80mg  daily. Chest CT was also ordered to rule out underlying lung mass. CT was performed on 11/15/2019 and showed mild cardiomegaly, moderate right pleural effusion with adjacent atelectasis or pneumonia in the right lower and middle lobes as well as coronary artery calcifications. Of note, prior to being started on Lasix, he was seen by Urology for urinary retention and had a catheter placed.  This was later removed and he performed in and out cath.   He had an Echo done in June 2021 showing EF 40-45% with severe HK of the entire inferior wall. There was severe basal septal hypertrophy with gr 2 diastolic dysfunction. Moderate MR, mild AS and mild Aortic root enlargement. We started him on losartan 25 mg daily but dose had to be reduced to 12.5 mg due to hypotension. He was seen in September with worsening CHF with weight gain, edema and worsening fatigue. Lasix dose was increased. Echo showed further decline in LV function with EF 30-35%. AS was felt to be moderate to severe ( low flow AS).   He subsequently underwent cardiac cath. This demonstrated severe 3 vessel CAD with complex anatomy as well as low flow severe AS.  He was seen in consultation with [name] [name] [name] and underwent combined AVR with a #29 Edwards Inspiris Resilia valve and CABG with LIMA to the LAD, sequential SVG to OM1 and OM2, and SVG to the PDA on 05/23/20.  His post op course was complicated by persistent low output CHF and hypotension. No beta blocker due to hypotension. No digoxin due to some AV  block. Was followed inpatient by the Heart failure service. Was on Dobutamine until Dec 18 then weaned off. He had some post op Afib but converted to NSR. He was placed on midodrine for hypotension. He had significant debility and was evaluated by OT an PT. He was treated for PNA. Repeat Echo showed EF 35% with good AVR prosthesis function. He was DC to SNF on 06/26/20.   Followed in Advanced heart failure clinic. Was felt to be in  Afib and DCCV arranged but when he presented to the hospital he was in NSR.   When last seen we started him on Entresto. He was seen in the Advanced heart failure clinic on 5/11 and was experiencing orthostasis and was switched back to losartan. Amiodarone discontinued. Was on lasix about every other day. Echo was repeated with EF 35-40%. Stable AV prosthesis. Mild to mod MR. Moderate RV dysfunction.   Was seen in Advanced heart failure clinic in Dec. Noted symptoms of fatigue. Losartan dose reduced due to low BP. Home sleep study recommended but they never followed through.   He is seen with his wife today. His major complaint is of lack of energy.  He is taking lasix twice a day now with increased right leg edema. No chest pain, palpitations, orthopnea. He is able to walk around his kitchen and bedroom 35 times. He is riding recumbent bike at the gym every other day for 40 minutes. He does complain of constipation. His weight is actually down 8 lbs. States he gets full easily.   Was seen in Advanced heart failure clinic in September. Noted fatigue but otherwise stable. Echo showed EF 40-45%, improved.  RV mod reduced, mod MR, mild-mod TR, trivial AR, normal structure and function of the aortic valve prosthesis. Home sleep study recommended. This showed only mild OSA.   He was seen by [name] [name] and underwent successful DCCV on April 11. On April 27, however, he was seen in the ED for some bleeding issues and Ecg showed Aflutter with variable response. His losartan was eventually discontinued due to low BP.  He was seen in Advanced heart failure clinic in Feb and doing OK. His Eliquis was reduced. He was started on Midodrine. He is on orgovyx for advanced prostate CA. He doesn't have much appetite and has lost 9 lbs. Activity is limited. No SOB or chest pain. No edema  Past Medical History:  Diagnosis Date   Aortic stenosis    Arthritis    CAD (coronary artery disease)    CHF (congestive heart  failure) (HCC)    Colitis    Edema    Essential hypertension    no med now   Essential tremor    Hyperlipidemia    Hypotonic bladder    L4-L5   Obstructive uropathy    Peripheral neuropathy    Elevated by [name]. love 2010   Prostate cancer [facility]) 2005    Past Surgical History:  Procedure Laterality Date   ACHILLES TENDON SURGERY Bilateral 1995   AORTIC VALVE REPLACEMENT N/A 05/23/2020   Procedure: AORTIC VALVE REPLACEMENT (AVR) USING EDWARDS RESILIA 29 MM AORTIC VALVE.;  Surgeon: Alleen Borne, MD;  Location: [facility] OR;  Service: Open Heart Surgery;  Laterality: N/A;   CARDIAC CATHETERIZATION     CARDIOVERSION N/A 10/07/2022   Procedure: CARDIOVERSION;  Surgeon: Dolores Patty, MD;  Location: [facility] INVASIVE CV LAB;  Service: Cardiovascular;  Laterality: N/A;   COLONOSCOPY     With polyp resection   CORONARY  ARTERY BYPASS GRAFT N/A 05/23/2020   Procedure: CORONARY ARTERY BYPASS GRAFTING (CABG) USING LIMA to LAD; ENDOSCOPIC HARVESTED RIGHT GREATER SAPHENOUS VEIN: SVG to PD; SVG to SEQUENCED INTERMEDIATE to OM;  Surgeon: Alleen Borne, MD;  Location: [facility] OR;  Service: Open Heart Surgery;  Laterality: N/A;   ENDOVEIN HARVEST OF GREATER SAPHENOUS VEIN Right 05/23/2020   Procedure: ENDOVEIN HARVEST OF GREATER SAPHENOUS VEIN;  Surgeon: Alleen Borne, MD;  Location: [facility] OR;  Service: Open Heart Surgery;  Laterality: Right;   REVERSE SHOULDER ARTHROPLASTY Right 06/04/2016   REVERSE SHOULDER ARTHROPLASTY Right 06/04/2016   Procedure: REVERSE SHOULDER ARTHROPLASTY;  Surgeon: Beverely Low, MD;  Location: [facility] OR;  Service: Orthopedics;  Laterality: Right;   RIGHT/LEFT HEART CATH AND CORONARY ANGIOGRAPHY N/A 05/20/2020   Procedure: RIGHT/LEFT HEART CATH AND CORONARY ANGIOGRAPHY;  Surgeon: [name], Adreanna Fickel M, MD;  Location: [facility] INVASIVE CV LAB;  Service: Cardiovascular;  Laterality: N/A;   SKIN CANCER EXCISION  1974   On chest wall   TEE WITHOUT CARDIOVERSION N/A 05/23/2020   Procedure: TRANSESOPHAGEAL  ECHOCARDIOGRAM (TEE);  Surgeon: Alleen Borne, MD;  Location: [facility] OR;  Service: Open Heart Surgery;  Laterality: N/A;   TOTAL KNEE ARTHROPLASTY Right 2011   TOTAL KNEE ARTHROPLASTY Left 04/09/2013   Procedure: LEFT TOTAL KNEE ARTHROPLASTY;  Surgeon: Loanne Drilling, MD;  Location: [facility] ORS;  Service: Orthopedics;  Laterality: Left;     Current Outpatient Medications  Medication Sig Dispense Refill   apixaban (ELIQUIS) 2.5 MG TABS tablet Take 1 tablet (2.5 mg total) by mouth 2 (two) times daily. 60 tablet 1   ascorbic acid (VITAMIN C) 500 MG tablet Take 500 mg by mouth daily.     aspirin 81 MG tablet Take 81 mg by mouth at bedtime.      B Complex Vitamins (B COMPLEX 100 PO) Take 1 tablet by mouth daily.     Biotin 5000 MCG TABS Take 5,000 mcg by mouth daily.     Calcium Carb-Cholecalciferol (CALCIUM + VITAMIN D3 PO) Take 1 tablet by mouth daily.     cholecalciferol (VITAMIN D3) 25 MCG (1000 UNIT) tablet Take 1,000 Units by mouth daily.     Coenzyme Q10 (CO Q-10) 100 MG CAPS Take 100 mg by mouth daily.     docusate sodium (COLACE) 100 MG capsule Take 100 mg by mouth daily.     dutasteride (AVODART) 0.5 MG capsule Take 0.5 mg by mouth daily.     feeding supplement, ENSURE COMPLETE, (ENSURE COMPLETE) LIQD Take 237 mLs by mouth daily.     ferrous sulfate 325 (65 FE) MG tablet Take 325 mg by mouth daily.     furosemide (LASIX) 40 MG tablet Take 1 tablet by mouth twice daily (Patient taking differently: Take 40 mg by mouth daily.) 180 tablet 3   magnesium oxide (MAG-OX) 400 (240 Mg) MG tablet Take 400 mg by mouth daily.     Methylcellulose, Laxative, (FIBER THERAPY PO) Take 2 capsules by mouth daily.     midodrine (PROAMATINE) 2.5 MG tablet Take 1 tablet (2.5 mg total) by mouth 2 (two) times daily with a meal. 60 tablet 1   mirtazapine (REMERON) 7.5 MG tablet Take 7.5 mg by mouth at bedtime.     Misc Natural Products (JOINT HEALTH PO) Take 1 tablet by mouth daily.     Misc Natural Products  (PROSTATE COMPLETE PO) Take 1 capsule by mouth daily.     Multiple Vitamins-Minerals (CENTRUM SILVER PO) Take 1 tablet by  mouth daily.      Multiple Vitamins-Minerals (LUTEIN-ZEAXANTHIN PO) Take 1 tablet by mouth daily.     ORGOVYX 120 MG tablet Take 120 mg by mouth daily.     OVER THE COUNTER MEDICATION Take 1 capsule by mouth daily. Neuriva     Potassium 99 MG TABS Take 99 mg by mouth daily.     Saw Palmetto 450 MG CAPS Take 450 mg by mouth daily.     sennosides-docusate sodium (SENOKOT-S) 8.6-50 MG tablet Take 2 tablets by mouth daily.     sulfaSALAzine (AZULFIDINE) 500 MG tablet Take 2,000 mg by mouth daily.     tamsulosin (FLOMAX) 0.4 MG CAPS Take 0.4 mg by mouth 2 (two) times daily.      TURMERIC CURCUMIN PO Take 1,500 mg by mouth daily.     No current facility-administered medications for this visit.    Allergies:   Simvastatin    Social History:  The patient  reports that he has never smoked. He has never used smokeless tobacco. He reports current alcohol use of about 1.0 standard drink of alcohol per week. He reports that he does not use drugs.   Family History:  The patient's family history includes Diabetes in his mother; Heart attack in his brother and maternal aunt; Heart failure in his mother; Hypertension in his mother.    ROS:  Please see the history of present illness.   Otherwise, review of systems are positive for none.   All other systems are reviewed and negative.    PHYSICAL EXAM: VS:  BP 108/74 (BP Location: Left Arm, Patient Position: Sitting, Cuff Size: Normal)   Pulse 75   Resp 16   Ht 5\' 9"  (1.753 m)   Wt 153 lb (69.4 kg)   SpO2 95%   BMI 22.59 kg/m  , BMI Body mass index is 22.59 kg/m. GEN: elderly WM, well developed, in no acute distress  HEENT: normal  Neck: no JVD, no carotid bruits, or masses Cardiac: IRRR; gr 1/6 systolic murmur at the RUSB. No  rubs, or gallops.  Respiratory:  clear GI  soft, nontender, nondistended, + BS MS: no deformity or  atrophy  Skin: warm and dry, no rash Ext: no edema Neuro:  Strength and sensation are intact Psych: euthymic mood, full affect   EKG:  EKG is not ordered today.  Recent Labs: 05/16/2023: BUN 26; Creatinine, Ser 1.34; Hemoglobin 12.6; Platelets 130; Potassium 4.3; Sodium 139    Lipid Panel No results found for: "CHOL", "TRIG", "HDL", "CHOLHDL", "VLDL", "LDLCALC", "LDLDIRECT"    Wt Readings from Last 3 Encounters:  09/27/23 153 lb (69.4 kg)  08/04/23 155 lb 12.8 oz (70.7 kg)  05/16/23 166 lb (75.3 kg)    - 10/12/2019: WBC 7.6, Hgb 14.2, Plts 246. Na 140, K 4.4, Glucose 103, BUN 12, Cr 1.26. AST 29, ALT 21, Alk Phos 61, Total Bili 0.5. TSH 0.849. UA showed 30mg  of protein but was otherwise unremarkable.  - 10/26/2019: Cr 1.20.  - 11/14/2019: BNP 1,380. Na 140, K 4.6, Glucose 107, BUN 22, Cr 1.16. AST 26, ALT, Alk Phos 59.  Dated 12/06/19: BUN 27, creatinine 1.4. CBC and CMET normal. Labs dated1/20/22: BUN 18, creatinine 1.2. Lytes OK. Hgb 11.1. normal WBC. BNP 2010.  Dated 08/24/21: BUN 27. Creatinine 1.2. otherwise CMET normal, TSH normal.  Dated 11/20/21: normal CBC.  Dated 229/24: A1c 5.5%, BUN 35, creatinine 1.35. otherwise BMET normal. CBC normal.    Other studies Reviewed: Additional studies/ records that were  reviewed today include:  Echo 12/10/19: IMPRESSIONS     1. Left ventricular ejection fraction, by estimation, is 40 to 45%. The  left ventricle has mildly decreased function. The left ventricle  demonstrates global hypokinesis. There is severe left ventricular  hypertrophy of the basal-septal segment. Left  ventricular diastolic parameters are consistent with Grade II diastolic  dysfunction (pseudonormalization). There is severe hypokinesis of the left  ventricular, entire inferior wall and inferoseptal wall.   2. Right ventricular systolic function is normal. The right ventricular  size is mildly enlarged. There is normal pulmonary artery systolic  pressure. The  estimated right ventricular systolic pressure is 31.2 mmHg.   3. Left atrial size was mild to moderately dilated.   4. The mitral valve is normal in structure. Moderate mitral valve  regurgitation. No evidence of mitral stenosis.   5. The aortic valve is tricuspid. Aortic valve regurgitation is not  visualized. Mild aortic valve stenosis. Aortic valve area, by VTI measures  1.51 cm. Aortic valve mean gradient measures 20.0 mmHg. Aortic valve Vmax  measures 2.46 m/s. The degree of AS   may be underestimated in the setting of LV dysfunction. rree-=--   6. Aortic dilatation noted. There is mild dilatation of the aortic root  and of the ascending aorta measuring 42 mm and 41mm respectively.   7. The inferior vena cava is dilated in size with >50% respiratory  variability, suggesting right atrial pressure of 8 mmHg.    Echo 04/01/20: IMPRESSIONS     1. LVEF has decreased from 12/10/2019, now 30-35% with LV dyssynchrony.  Aortic stenosis is most probably severe with low flow low gradient,  dimensionless index 0.26.   2. Left ventricular ejection fraction, by estimation, is 30 to 35%. The  left ventricle has moderately decreased function. The left ventricle  demonstrates global hypokinesis. The left ventricular internal cavity size  was mildly dilated. There is severe  asymmetric left ventricular hypertrophy of the basal-septal segment. Left  ventricular diastolic function could not be evaluated.   3. Right ventricular systolic function is mildly reduced. The right  ventricular size is mildly enlarged. There is moderately elevated  pulmonary artery systolic pressure. The estimated right ventricular  systolic pressure is 55.2 mmHg.   4. Left atrial size was severely dilated.   5. Right atrial size was moderately dilated.   6. The mitral valve is normal in structure. Moderate mitral valve  regurgitation. No evidence of mitral stenosis.   7. The aortic valve is normal in structure. Aortic  valve regurgitation is  not visualized. Moderate to severe aortic valve stenosis. Aortic valve  mean gradient measures 24.0 mmHg.   8. Aortic dilatation noted. There is mild to moderate dilatation of the  ascending aorta, measuring 44 mm.   9. The inferior vena cava is dilated in size with <50% respiratory  variability, suggesting right atrial pressure of 15 mmHg.   Echo 11/05/20: IMPRESSIONS     1. Left ventricular ejection fraction, by estimation, is 35 to 40%. The  left ventricle has moderately decreased function. The left ventricle  demonstrates regional wall motion abnormalities (see scoring  diagram/findings for description). There is  moderate left ventricular hypertrophy. Left ventricular diastolic  parameters are consistent with Grade I diastolic dysfunction (impaired  relaxation). There is akinesis of the left ventricular, entire inferior  wall.   2. Right ventricular systolic function is moderately reduced. The right  ventricular size is moderately enlarged. There is normal pulmonary artery  systolic pressure.  3. Left atrial size was severely dilated.   4. Right atrial size was severely dilated.   5. The mitral valve is normal in structure. Mild to moderate mitral valve  regurgitation. No evidence of mitral stenosis.   6. The aortic valve has been repaired/replaced. Aortic valve  regurgitation is trivial. No aortic stenosis is present. There is a 29 mm  Edwards bioprosthetic valve present in the aortic position. Procedure  Date: 05/23/2020. Echo findings are consistent  with normal structure and function of the aortic valve prosthesis.   7. Aortic dilatation noted. There is mild dilatation of the aortic root,  measuring 42 mm. There is mild dilatation of the ascending aorta,  measuring 43 mm.   8. The inferior vena cava is normal in size with greater than 50%  respiratory variability, suggesting right atrial pressure of 3 mmHg.   Echo 03/18/22: IMPRESSIONS     1.  Left ventricular ejection fraction, by estimation, is 40 to 45%. The  left ventricle has mildly decreased function. The left ventricle  demonstrates regional wall motion abnormalities (see scoring  diagram/findings for description). There is severe  asymmetric left ventricular hypertrophy of the basal-septal segment. Left  ventricular diastolic parameters are indeterminate. There is akinesis of  the left ventricular, entire inferior wall and inferolateral wall.   2. Right ventricular systolic function is moderately reduced. The right  ventricular size is mildly enlarged. There is normal pulmonary artery  systolic pressure.   3. Left atrial size was severely dilated.   4. Right atrial size was severely dilated.   5. The mitral valve is degenerative. Moderate mitral valve regurgitation.  No evidence of mitral stenosis.   6. Tricuspid valve regurgitation is mild to moderate.   7. The aortic valve has been repaired/replaced. Aortic valve  regurgitation is trivial. No aortic stenosis is present. There is a 29 mm  bioprosthetic valve present in the aortic position. Procedure Date:  05/23/2020. Echo findings are consistent with  normal structure and function of the aortic valve prosthesis. Aortic valve  area, by VTI measures 4.62 cm. Aortic valve mean gradient measures 5.0  mmHg. Aortic valve Vmax measures 1.42 m/s.   8. The inferior vena cava is normal in size with greater than 50%  respiratory variability, suggesting right atrial pressure of 3 mmHg.    ASSESSMENT AND PLAN:  1. Chronic combined systolic/diastolic CHF. EF 30-35%.  Patient is s/p AVR for severe AS and CABG for severe multivessel CAD. Prolonged hospital course due to persistent CHF with low output.  - clinically is doing well. euvolemic - intolerant of GDMT due to low BP. Currently just on lasix.  -continue sodium restriction but needs to increase calorie intake. Encourage using Ensure daily. - last Echo showed improved EF  40-45%    2. Severe low flow AS.  - s/p AVR. Follow up Echo showed good valve function.    3. CAD severe s/p CABG. No active angina.   4. Afib. S/p DCCV but ERAD with Atrial flutter. Rate is controlled. On Eliquis.    5.  CKD. Stage 3a  6. Advanced prostate CA on Orgovyx now.    Follow up in 6 months.  Signed, Carli Lefevers [place], MD  09/27/2023 3:18 PM    [facility] [address] [place], [place], [address] Phone [phone], Fax [phone]

## 2023-09-26 ENCOUNTER — Encounter (HOSPITAL_BASED_OUTPATIENT_CLINIC_OR_DEPARTMENT_OTHER): Payer: Self-pay

## 2023-09-26 DIAGNOSIS — C44529 Squamous cell carcinoma of skin of other part of trunk: Secondary | ICD-10-CM | POA: Diagnosis not present

## 2023-09-27 ENCOUNTER — Encounter: Payer: Self-pay | Admitting: Cardiology

## 2023-09-27 ENCOUNTER — Ambulatory Visit: Payer: Medicare HMO | Attending: Cardiology | Admitting: Cardiology

## 2023-09-27 VITALS — BP 108/74 | HR 75 | Resp 16 | Ht 69.0 in | Wt 153.0 lb

## 2023-09-27 DIAGNOSIS — I5042 Chronic combined systolic (congestive) and diastolic (congestive) heart failure: Secondary | ICD-10-CM

## 2023-09-27 DIAGNOSIS — Z951 Presence of aortocoronary bypass graft: Secondary | ICD-10-CM

## 2023-09-27 DIAGNOSIS — I4819 Other persistent atrial fibrillation: Secondary | ICD-10-CM | POA: Diagnosis not present

## 2023-09-27 DIAGNOSIS — Z952 Presence of prosthetic heart valve: Secondary | ICD-10-CM | POA: Diagnosis not present

## 2023-09-27 DIAGNOSIS — N1831 Chronic kidney disease, stage 3a: Secondary | ICD-10-CM

## 2023-09-27 NOTE — Patient Instructions (Signed)
 Medication Instructions:  Continue same medications *If you need a refill on your cardiac medications before your next appointment, please call your pharmacy*  Lab Work: None ordered  Testing/Procedures: None ordered  Follow-Up: At [facility], you and your health needs are our priority.  As part of our continuing mission to provide you with exceptional heart care, our providers are all part of one team.  This team includes your primary Cardiologist (physician) and Advanced Practice Providers or APPs (Physician Assistants and Nurse Practitioners) who all work together to provide you with the care you need, when you need it.  Your next appointment:  6 months   Call in July to schedule Oct appointment     Provider:  Dr.Jordan  We recommend signing up for the patient portal called "MyChart".  Sign up information is provided on this After Visit Summary.  MyChart is used to connect with patients for Virtual Visits (Telemedicine).  Patients are able to view lab/test results, encounter notes, upcoming appointments, etc.  Non-urgent messages can be sent to your provider as well.   To learn more about what you can do with MyChart, go to [email or web address].         1st Floor: - Lobby - Registration  - Pharmacy  - Lab - Cafe  2nd Floor: - PV Lab - Diagnostic Testing (echo, CT, nuclear med)  3rd Floor: - Vacant  4th Floor: - TCTS (cardiothoracic surgery) - AFib Clinic - Structural Heart Clinic - Vascular Surgery  - Vascular Ultrasound  5th Floor: - HeartCare Cardiology (general and EP) - Clinical Pharmacy for coumadin, hypertension, lipid, weight-loss medications, and med management appointments    Valet parking services will be available as well.

## 2023-10-02 ENCOUNTER — Other Ambulatory Visit: Payer: Self-pay | Admitting: Cardiology

## 2023-10-02 DIAGNOSIS — I480 Paroxysmal atrial fibrillation: Secondary | ICD-10-CM

## 2023-10-03 NOTE — Telephone Encounter (Signed)
 Prescription refill request for Eliquis received. Indication:aflutter Last office visit:4/25 Scr:1.34  11/24 Age: 89 Weight:69.4  kg  Prescription refilled

## 2023-10-09 ENCOUNTER — Other Ambulatory Visit (HOSPITAL_COMMUNITY): Payer: Self-pay | Admitting: Internal Medicine

## 2023-10-17 DIAGNOSIS — C44321 Squamous cell carcinoma of skin of nose: Secondary | ICD-10-CM | POA: Diagnosis not present

## 2023-11-11 DIAGNOSIS — R339 Retention of urine, unspecified: Secondary | ICD-10-CM | POA: Diagnosis not present

## 2023-11-15 DIAGNOSIS — C61 Malignant neoplasm of prostate: Secondary | ICD-10-CM | POA: Diagnosis not present

## 2023-12-07 DIAGNOSIS — F325 Major depressive disorder, single episode, in full remission: Secondary | ICD-10-CM | POA: Diagnosis not present

## 2023-12-07 DIAGNOSIS — I5042 Chronic combined systolic (congestive) and diastolic (congestive) heart failure: Secondary | ICD-10-CM | POA: Diagnosis not present

## 2023-12-07 DIAGNOSIS — K5190 Ulcerative colitis, unspecified, without complications: Secondary | ICD-10-CM | POA: Diagnosis not present

## 2023-12-07 DIAGNOSIS — C61 Malignant neoplasm of prostate: Secondary | ICD-10-CM | POA: Diagnosis not present

## 2023-12-07 DIAGNOSIS — I484 Atypical atrial flutter: Secondary | ICD-10-CM | POA: Diagnosis not present

## 2023-12-07 DIAGNOSIS — K219 Gastro-esophageal reflux disease without esophagitis: Secondary | ICD-10-CM | POA: Diagnosis not present

## 2023-12-07 DIAGNOSIS — I959 Hypotension, unspecified: Secondary | ICD-10-CM | POA: Diagnosis not present

## 2023-12-07 DIAGNOSIS — N312 Flaccid neuropathic bladder, not elsewhere classified: Secondary | ICD-10-CM | POA: Diagnosis not present

## 2023-12-07 DIAGNOSIS — R634 Abnormal weight loss: Secondary | ICD-10-CM | POA: Diagnosis not present

## 2023-12-23 ENCOUNTER — Emergency Department (HOSPITAL_COMMUNITY)

## 2023-12-23 ENCOUNTER — Other Ambulatory Visit: Payer: Self-pay

## 2023-12-23 ENCOUNTER — Emergency Department (HOSPITAL_COMMUNITY): Admission: EM | Admit: 2023-12-23 | Discharge: 2023-12-23 | Disposition: A | Discharge status: Home / Self Care

## 2023-12-23 DIAGNOSIS — G319 Degenerative disease of nervous system, unspecified: Secondary | ICD-10-CM | POA: Diagnosis not present

## 2023-12-23 DIAGNOSIS — I6782 Cerebral ischemia: Secondary | ICD-10-CM | POA: Diagnosis not present

## 2023-12-23 DIAGNOSIS — R918 Other nonspecific abnormal finding of lung field: Secondary | ICD-10-CM | POA: Diagnosis not present

## 2023-12-23 DIAGNOSIS — S199XXA Unspecified injury of neck, initial encounter: Secondary | ICD-10-CM | POA: Diagnosis not present

## 2023-12-23 DIAGNOSIS — S40012A Contusion of left shoulder, initial encounter: Secondary | ICD-10-CM | POA: Diagnosis not present

## 2023-12-23 DIAGNOSIS — M19012 Primary osteoarthritis, left shoulder: Secondary | ICD-10-CM | POA: Diagnosis not present

## 2023-12-23 DIAGNOSIS — W1809XA Striking against other object with subsequent fall, initial encounter: Secondary | ICD-10-CM | POA: Diagnosis not present

## 2023-12-23 DIAGNOSIS — Z7982 Long term (current) use of aspirin: Secondary | ICD-10-CM | POA: Insufficient documentation

## 2023-12-23 DIAGNOSIS — Z7901 Long term (current) use of anticoagulants: Secondary | ICD-10-CM | POA: Insufficient documentation

## 2023-12-23 DIAGNOSIS — S0990XA Unspecified injury of head, initial encounter: Secondary | ICD-10-CM | POA: Diagnosis not present

## 2023-12-23 DIAGNOSIS — M25512 Pain in left shoulder: Secondary | ICD-10-CM | POA: Diagnosis not present

## 2023-12-23 DIAGNOSIS — I10 Essential (primary) hypertension: Secondary | ICD-10-CM | POA: Diagnosis not present

## 2023-12-23 DIAGNOSIS — W19XXXA Unspecified fall, initial encounter: Secondary | ICD-10-CM | POA: Diagnosis not present

## 2023-12-23 DIAGNOSIS — Z043 Encounter for examination and observation following other accident: Secondary | ICD-10-CM | POA: Diagnosis not present

## 2023-12-23 DIAGNOSIS — Z471 Aftercare following joint replacement surgery: Secondary | ICD-10-CM | POA: Diagnosis not present

## 2023-12-23 DIAGNOSIS — I517 Cardiomegaly: Secondary | ICD-10-CM | POA: Diagnosis not present

## 2023-12-23 MED ORDER — ACETAMINOPHEN 500 MG PO TABS
1000.0000 mg | ORAL_TABLET | Freq: Once | ORAL | Status: AC
  Administered 2023-12-23: 1000 mg via ORAL
  Filled 2023-12-23: qty 2

## 2023-12-23 NOTE — ED Notes (Signed)
 Pt self caths he requested to get cathed before he goes home  myself and lexi nt staright cathed the pt obtaining 950  ml if urine  pt discharged to home with family

## 2023-12-23 NOTE — ED Triage Notes (Addendum)
 Pt bib ems from home for fall. Unwitnessed falll at 5am trying to get plates out of the cupboard., hit left shoulder, denies hitting head. Pt is on BT. Pt was found on floor by wife but wife just called ems recently. C/o left shoulder pain. Denies any other pain. Pt is A&OX4.  160/90 HR 92 SPO2 99 RA CBG 160. No neuro symptoms noted. Pt in c collar

## 2023-12-23 NOTE — Discharge Instructions (Signed)
 Please take 1000 mg of Tylenol  every 8 hours as needed for pain.  You may also try using the Voltaren gel.  If you are still having pain Monday please call your orthopedic surgeon for follow-up.  Return to the ER for worsening symptoms.

## 2023-12-23 NOTE — ED Provider Notes (Signed)
 [place] EMERGENCY DEPARTMENT AT [facility] Provider Note   CSN: [id number] Arrival date & time: 12/23/23  1436     Patient presents with: No chief complaint on file.   [name] [name] is a 89 y.o. male.   89 year old male with past medical history of atrial fibrillation on Eliquis  presenting to the emergency department today with left shoulder pain.  The patient states that he lost his footing after picking up something heavy at his house and fell.  He fell on his left side and is complaining of left shoulder pain since then.  He denies any chest pain or shortness of breath.  Denies any pain in his extremities.  He states that he did hit his head but did not lose consciousness.  Denies any neck pain.  He came to the ER today for further evaluation regarding this.        Prior to Admission medications   Medication Sig Start Date End Date Taking? Authorizing Provider  apixaban  (ELIQUIS ) 2.5 MG TABS tablet Take 1 tablet (2.5 mg total) by mouth 2 (two) times daily. 08/04/23   Bensimhon, Toribio SAUNDERS, MD  apixaban  (ELIQUIS ) 5 MG TABS tablet Take 1 tablet by mouth twice daily 10/03/23   [place], Peter M, MD  ascorbic acid (VITAMIN C) 500 MG tablet Take 500 mg by mouth daily.    [provider]  aspirin  81 MG tablet Take 81 mg by mouth at bedtime.     [provider]  B Complex Vitamins (B COMPLEX 100 PO) Take 1 tablet by mouth daily.    [provider]  Biotin 5000 MCG TABS Take 5,000 mcg by mouth daily.    [provider]  Calcium  Carb-Cholecalciferol (CALCIUM  + VITAMIN D3 PO) Take 1 tablet by mouth daily.    [provider]  cholecalciferol (VITAMIN D3) 25 MCG (1000 UNIT) tablet Take 1,000 Units by mouth daily.    [provider]  Coenzyme Q10 (CO Q-10) 100 MG CAPS Take 100 mg by mouth daily.    [provider]  docusate sodium  (COLACE) 100 MG capsule Take 100 mg by mouth daily.    [provider]  dutasteride   (AVODART ) 0.5 MG capsule Take 0.5 mg by mouth daily.    [provider]  feeding supplement, ENSURE COMPLETE, (ENSURE COMPLETE) LIQD Take 237 mLs by mouth daily.    [provider]  ferrous sulfate 325 (65 FE) MG tablet Take 325 mg by mouth daily.    [provider]  furosemide  (LASIX ) 40 MG tablet Take 1 tablet by mouth twice daily Patient taking differently: Take 40 mg by mouth daily. 03/01/23   [place], Peter M, MD  magnesium  oxide (MAG-OX) 400 (240 Mg) MG tablet Take 400 mg by mouth daily.    [provider]  Methylcellulose, Laxative, (FIBER THERAPY PO) Take 2 capsules by mouth daily.    [provider]  midodrine  (PROAMATINE ) 2.5 MG tablet TAKE 1 TABLET BY MOUTH TWICE DAILY WITH A MEAL 10/11/23   Bensimhon, Toribio SAUNDERS, MD  mirtazapine  (REMERON ) 7.5 MG tablet Take 7.5 mg by mouth at bedtime. 09/30/22   [provider]  Misc Natural Products (JOINT HEALTH PO) Take 1 tablet by mouth daily.    [provider]  Misc Natural Products (PROSTATE COMPLETE PO) Take 1 capsule by mouth daily.    [provider]  Multiple Vitamins-Minerals (CENTRUM SILVER PO) Take 1 tablet by mouth daily.     [provider]  Multiple Vitamins-Minerals (LUTEIN-ZEAXANTHIN PO) Take 1 tablet by mouth daily.    [provider]  ORGOVYX 120 MG tablet Take 120 mg by mouth daily.    [provider]  OVER THE COUNTER MEDICATION Take 1 capsule by mouth daily. Neuriva    [provider]  Potassium 99 MG TABS Take 99 mg by mouth daily.    [provider]  Saw Palmetto  450 MG CAPS Take 450 mg by mouth daily.    [provider]  sennosides-docusate sodium  (SENOKOT-S) 8.6-50 MG tablet Take 2 tablets by mouth daily.    [provider]  sulfaSALAzine  (AZULFIDINE ) 500 MG tablet Take 2,000 mg by mouth daily. 08/15/20   [provider]  tamsulosin  (FLOMAX ) 0.4 MG CAPS Take 0.4 mg by mouth 2 (two) times  daily.     [provider]  TURMERIC CURCUMIN PO Take 1,500 mg by mouth daily.    [provider]    Allergies: Simvastatin    Review of Systems  Musculoskeletal:        Left shoulder pain  All other systems reviewed and are negative.   Updated Vital Signs BP 97/73   Pulse 74   Temp (!) 97.4 F (36.3 C)   Resp 18   Ht 5' 9 (1.753 m)   Wt 69.4 kg   SpO2 95%   BMI 22.59 kg/m   Physical Exam Vitals and nursing note reviewed.   Gen: NAD Eyes: PERRL, EOMI HEENT: no oropharyngeal swelling Neck: trachea midline, no cervical spine tenderness, no stepoffs or deformities Resp: clear to auscultation bilaterally Card: RRR, no murmurs, rubs, or gallops Abd: nontender, nondistended, no seatbelt sign Extremities: no calf tenderness, no edema MSK: no thoracic spinal tenderness, no lumbar spinal tenderness, no step-offs or deformities, the patient is tender over the lateral aspect of the left clavicle as well as over the proximal humerus on the left with diminished range of motion of the left shoulder secondary to this Vascular: 2+ radial pulses bilaterally, 2+ DP pulses bilaterally Neuro: Alert and oriented x 3, equal strength sensation throughout bilateral upper and lower extremities Skin: no rashes    (all labs ordered are listed, but only abnormal results are displayed) Labs Reviewed - No data to display  EKG: None  Radiology: CT Head Wo Contrast Result Date: 12/23/2023 CLINICAL DATA:  Head trauma, intracranial arterial injury suspected; Neck trauma (Age >= 65y). Unwitnessed fall. On blood thinner. Left shoulder pain. EXAM: CT HEAD WITHOUT CONTRAST CT CERVICAL SPINE WITHOUT CONTRAST TECHNIQUE: Multidetector CT imaging of the head and cervical spine was performed following the standard protocol without intravenous contrast. Multiplanar CT image reconstructions of the cervical spine were also generated. RADIATION DOSE REDUCTION: This exam was performed according  to the departmental dose-optimization program which includes automated exposure control, adjustment of the mA and/or kV according to patient size and/or use of iterative reconstruction technique. COMPARISON:  Head CT 05/06/2010 and MRI 11/18/2008 FINDINGS: CT HEAD FINDINGS Brain: No acute large territory infarct, intracranial hemorrhage, mass, midline shift, or extra-axial fluid collection is identified. There is progressive chronic ischemia since the remote comparison studies including small cortical infarcts in the frontal lobes and right occipital lobe and moderate chronic small vessel ischemia in the cerebral white matter. There are also small chronic bilateral cerebellar infarcts. There is mild cerebral atrophy. Vascular: Calcified atherosclerosis at the skull base. No hyperdense vessel. Skull: No fracture or suspicious lesion. Sinuses/Orbits: Mild mucosal thickening in the ethmoid sinuses. Clear mastoid air cells. Bilateral  cataract extraction. Other: None. CT CERVICAL SPINE FINDINGS Alignment: Cervical spine straightening. Trace anterolisthesis of C7 on T1. Skull base and vertebrae: No acute fracture or suspicious lesion. Moderate atlantodental degenerative changes. Soft tissues and spinal canal: No prevertebral fluid or swelling. No visible canal hematoma. Disc levels: Advanced disc degeneration from C3-4 through T1-2. Interbody and facet ankylosis from C3-C5. Widespread advanced facet arthrosis. Moderate neural foraminal stenosis on the right at C5-6 and bilaterally at C6-7. No evidence of high-grade spinal canal stenosis. Upper chest: The included lung apices are clear. Other: None. IMPRESSION: 1. No evidence of acute intracranial abnormality or cervical spine fracture. 2. Chronic ischemia with multiple old small infarcts as above. Electronically Signed   By: Dasie Hamburg M.D.   On: 12/23/2023 15:55   CT Cervical Spine Wo Contrast Result Date: 12/23/2023 CLINICAL DATA:  Head trauma, intracranial  arterial injury suspected; Neck trauma (Age >= 65y). Unwitnessed fall. On blood thinner. Left shoulder pain. EXAM: CT HEAD WITHOUT CONTRAST CT CERVICAL SPINE WITHOUT CONTRAST TECHNIQUE: Multidetector CT imaging of the head and cervical spine was performed following the standard protocol without intravenous contrast. Multiplanar CT image reconstructions of the cervical spine were also generated. RADIATION DOSE REDUCTION: This exam was performed according to the departmental dose-optimization program which includes automated exposure control, adjustment of the mA and/or kV according to patient size and/or use of iterative reconstruction technique. COMPARISON:  Head CT 05/06/2010 and MRI 11/18/2008 FINDINGS: CT HEAD FINDINGS Brain: No acute large territory infarct, intracranial hemorrhage, mass, midline shift, or extra-axial fluid collection is identified. There is progressive chronic ischemia since the remote comparison studies including small cortical infarcts in the frontal lobes and right occipital lobe and moderate chronic small vessel ischemia in the cerebral white matter. There are also small chronic bilateral cerebellar infarcts. There is mild cerebral atrophy. Vascular: Calcified atherosclerosis at the skull base. No hyperdense vessel. Skull: No fracture or suspicious lesion. Sinuses/Orbits: Mild mucosal thickening in the ethmoid sinuses. Clear mastoid air cells. Bilateral cataract extraction. Other: None. CT CERVICAL SPINE FINDINGS Alignment: Cervical spine straightening. Trace anterolisthesis of C7 on T1. Skull base and vertebrae: No acute fracture or suspicious lesion. Moderate atlantodental degenerative changes. Soft tissues and spinal canal: No prevertebral fluid or swelling. No visible canal hematoma. Disc levels: Advanced disc degeneration from C3-4 through T1-2. Interbody and facet ankylosis from C3-C5. Widespread advanced facet arthrosis. Moderate neural foraminal stenosis on the right at C5-6 and  bilaterally at C6-7. No evidence of high-grade spinal canal stenosis. Upper chest: The included lung apices are clear. Other: None. IMPRESSION: 1. No evidence of acute intracranial abnormality or cervical spine fracture. 2. Chronic ischemia with multiple old small infarcts as above. Electronically Signed   By: Dasie Hamburg M.D.   On: 12/23/2023 15:55   DG Chest Portable 1 View Result Date: 12/23/2023 CLINICAL DATA:  Unwitnessed fall. EXAM: PORTABLE CHEST 1 VIEW COMPARISON:  October 09, 2020. FINDINGS: Stable cardiomegaly. Status post aortic valve repair. Status post right shoulder arthroplasty. Left lung is clear. Minimal right basilar opacity is noted laterally suggesting inflammation or atelectasis. IMPRESSION: Minimal right basilar opacity is noted concerning for pneumonia or atelectasis. Followup PA and lateral chest X-ray is recommended in 3-4 weeks following trial of antibiotic therapy to ensure resolution and exclude underlying malignancy. Electronically Signed   By: Lynwood Landy Raddle M.D.   On: 12/23/2023 15:21   DG Shoulder Left Result Date: 12/23/2023 CLINICAL DATA:  Unwitnessed fall. EXAM: LEFT SHOULDER - 2+ VIEW COMPARISON:  May 26, 2015. FINDINGS: There is no evidence of fracture or dislocation. Severe narrowing of glenohumeral joint is noted. Severe narrowing of subacromial space is noted suggesting chronic rotator cuff injury. Soft tissues are unremarkable. IMPRESSION: Severe degenerative changes as noted above. No acute abnormality seen. Electronically Signed   By: Lynwood Landy Raddle M.D.   On: 12/23/2023 15:19     Procedures   Medications Ordered in the ED  acetaminophen  (TYLENOL ) tablet 1,000 mg (has no administration in time range)                                    Medical Decision Making 89 year old male with past medical history of atrial fibrillation on Eliquis  presenting to the emergency department today with left shoulder pain after he fell and hit his head and shoulder  earlier today.  Will further evaluate him here with a CT scan of his head and cervical spine in addition to a chest x-ray and shoulder x-ray to evaluate for acute traumatic injuries.  Will give the patient Tylenol  for pain and reevaluate for ultimate disposition.  The patient's x-rays are and CT scans are unremarkable.  He is discharged with return precautions.  Amount and/or Complexity of Data Reviewed Radiology: ordered.  Risk OTC drugs.        Final diagnoses:  Contusion of left shoulder, initial encounter    ED Discharge Orders     None          Ula Prentice SAUNDERS, MD 12/23/23 [phone]

## 2023-12-23 NOTE — ED Notes (Signed)
 Port xrays complete  the pt has been taken to c-t  family art  the bedside

## 2023-12-28 DIAGNOSIS — Z6821 Body mass index (BMI) 21.0-21.9, adult: Secondary | ICD-10-CM | POA: Diagnosis not present

## 2023-12-28 DIAGNOSIS — I959 Hypotension, unspecified: Secondary | ICD-10-CM | POA: Diagnosis not present

## 2023-12-28 DIAGNOSIS — J690 Pneumonitis due to inhalation of food and vomit: Secondary | ICD-10-CM | POA: Diagnosis not present

## 2024-01-12 DIAGNOSIS — R296 Repeated falls: Secondary | ICD-10-CM | POA: Diagnosis not present

## 2024-01-12 DIAGNOSIS — Z6820 Body mass index (BMI) 20.0-20.9, adult: Secondary | ICD-10-CM | POA: Diagnosis not present

## 2024-01-12 DIAGNOSIS — R1312 Dysphagia, oropharyngeal phase: Secondary | ICD-10-CM | POA: Diagnosis not present

## 2024-01-12 DIAGNOSIS — E441 Mild protein-calorie malnutrition: Secondary | ICD-10-CM | POA: Diagnosis not present

## 2024-01-12 DIAGNOSIS — R053 Chronic cough: Secondary | ICD-10-CM | POA: Diagnosis not present

## 2024-01-16 ENCOUNTER — Emergency Department (HOSPITAL_BASED_OUTPATIENT_CLINIC_OR_DEPARTMENT_OTHER): Admission: EM | Admit: 2024-01-16 | Discharge: 2024-01-16 | Disposition: A | Discharge status: Home / Self Care

## 2024-01-16 ENCOUNTER — Emergency Department (HOSPITAL_COMMUNITY)

## 2024-01-16 ENCOUNTER — Emergency Department (HOSPITAL_BASED_OUTPATIENT_CLINIC_OR_DEPARTMENT_OTHER)

## 2024-01-16 ENCOUNTER — Other Ambulatory Visit: Payer: Self-pay

## 2024-01-16 ENCOUNTER — Encounter (HOSPITAL_BASED_OUTPATIENT_CLINIC_OR_DEPARTMENT_OTHER): Payer: Self-pay

## 2024-01-16 DIAGNOSIS — Z8546 Personal history of malignant neoplasm of prostate: Secondary | ICD-10-CM | POA: Insufficient documentation

## 2024-01-16 DIAGNOSIS — R634 Abnormal weight loss: Secondary | ICD-10-CM | POA: Diagnosis not present

## 2024-01-16 DIAGNOSIS — M4802 Spinal stenosis, cervical region: Secondary | ICD-10-CM | POA: Diagnosis not present

## 2024-01-16 DIAGNOSIS — Z7901 Long term (current) use of anticoagulants: Secondary | ICD-10-CM | POA: Diagnosis not present

## 2024-01-16 DIAGNOSIS — S20212A Contusion of left front wall of thorax, initial encounter: Secondary | ICD-10-CM | POA: Insufficient documentation

## 2024-01-16 DIAGNOSIS — I639 Cerebral infarction, unspecified: Secondary | ICD-10-CM | POA: Diagnosis not present

## 2024-01-16 DIAGNOSIS — X58XXXA Exposure to other specified factors, initial encounter: Secondary | ICD-10-CM | POA: Diagnosis not present

## 2024-01-16 DIAGNOSIS — R531 Weakness: Secondary | ICD-10-CM | POA: Diagnosis not present

## 2024-01-16 DIAGNOSIS — Z7982 Long term (current) use of aspirin: Secondary | ICD-10-CM | POA: Diagnosis not present

## 2024-01-16 DIAGNOSIS — M129 Arthropathy, unspecified: Secondary | ICD-10-CM | POA: Diagnosis not present

## 2024-01-16 DIAGNOSIS — I2510 Atherosclerotic heart disease of native coronary artery without angina pectoris: Secondary | ICD-10-CM | POA: Diagnosis not present

## 2024-01-16 DIAGNOSIS — Z8673 Personal history of transient ischemic attack (TIA), and cerebral infarction without residual deficits: Secondary | ICD-10-CM | POA: Diagnosis not present

## 2024-01-16 DIAGNOSIS — R4182 Altered mental status, unspecified: Secondary | ICD-10-CM | POA: Diagnosis not present

## 2024-01-16 DIAGNOSIS — I509 Heart failure, unspecified: Secondary | ICD-10-CM | POA: Insufficient documentation

## 2024-01-16 DIAGNOSIS — I110 Hypertensive heart disease with heart failure: Secondary | ICD-10-CM | POA: Diagnosis not present

## 2024-01-16 DIAGNOSIS — I10 Essential (primary) hypertension: Secondary | ICD-10-CM | POA: Diagnosis not present

## 2024-01-16 DIAGNOSIS — R7989 Other specified abnormal findings of blood chemistry: Secondary | ICD-10-CM | POA: Diagnosis not present

## 2024-01-16 DIAGNOSIS — R9089 Other abnormal findings on diagnostic imaging of central nervous system: Secondary | ICD-10-CM | POA: Diagnosis not present

## 2024-01-16 DIAGNOSIS — R471 Dysarthria and anarthria: Secondary | ICD-10-CM | POA: Diagnosis not present

## 2024-01-16 DIAGNOSIS — S0990XA Unspecified injury of head, initial encounter: Secondary | ICD-10-CM | POA: Diagnosis not present

## 2024-01-16 DIAGNOSIS — R1310 Dysphagia, unspecified: Secondary | ICD-10-CM | POA: Diagnosis not present

## 2024-01-16 LAB — COMPREHENSIVE METABOLIC PANEL WITH GFR
ALT: 11 U/L (ref 0–44)
AST: 26 U/L (ref 15–41)
Albumin: 3.8 g/dL (ref 3.5–5.0)
Alkaline Phosphatase: 88 U/L (ref 38–126)
Anion gap: 15 (ref 5–15)
BUN: 37 mg/dL — ABNORMAL HIGH (ref 8–23)
CO2: 26 mmol/L (ref 22–32)
Calcium: 9.4 mg/dL (ref 8.9–10.3)
Chloride: 101 mmol/L (ref 98–111)
Creatinine, Ser: 1.8 mg/dL — ABNORMAL HIGH (ref 0.61–1.24)
GFR, Estimated: 35 mL/min — ABNORMAL LOW
Glucose, Bld: 169 mg/dL — ABNORMAL HIGH (ref 70–99)
Potassium: 3.6 mmol/L (ref 3.5–5.1)
Sodium: 143 mmol/L (ref 135–145)
Total Bilirubin: 0.8 mg/dL (ref 0.0–1.2)
Total Protein: 7.2 g/dL (ref 6.5–8.1)

## 2024-01-16 LAB — CBC WITH DIFFERENTIAL/PLATELET
Abs Immature Granulocytes: 0.03 K/uL (ref 0.00–0.07)
Basophils Absolute: 0.1 K/uL (ref 0.0–0.1)
Basophils Relative: 1 %
Eosinophils Absolute: 0.1 K/uL (ref 0.0–0.5)
Eosinophils Relative: 1 %
HCT: 40.2 % (ref 39.0–52.0)
Hemoglobin: 12.9 g/dL — ABNORMAL LOW (ref 13.0–17.0)
Immature Granulocytes: 0 %
Lymphocytes Relative: 10 %
Lymphs Abs: 0.7 K/uL (ref 0.7–4.0)
MCH: 30.7 pg (ref 26.0–34.0)
MCHC: 32.1 g/dL (ref 30.0–36.0)
MCV: 95.7 fL (ref 80.0–100.0)
Monocytes Absolute: 0.5 K/uL (ref 0.1–1.0)
Monocytes Relative: 7 %
Neutro Abs: 5.9 K/uL (ref 1.7–7.7)
Neutrophils Relative %: 81 %
Platelets: 204 K/uL (ref 150–400)
RBC: 4.2 MIL/uL — ABNORMAL LOW (ref 4.22–5.81)
RDW: 13.8 % (ref 11.5–15.5)
WBC: 7.3 K/uL (ref 4.0–10.5)
nRBC: 0 % (ref 0.0–0.2)

## 2024-01-16 LAB — CBG MONITORING, ED: Glucose-Capillary: 160 mg/dL — ABNORMAL HIGH (ref 70–99)

## 2024-01-16 LAB — LIPASE, BLOOD: Lipase: 13 U/L (ref 11–51)

## 2024-01-16 MED ORDER — GADOBUTROL 1 MMOL/ML IV SOLN
7.0000 mL | Freq: Once | INTRAVENOUS | Status: AC | PRN
  Administered 2024-01-16: 7 mL via INTRAVENOUS

## 2024-01-16 MED ORDER — LACTATED RINGERS IV BOLUS
500.0000 mL | Freq: Once | INTRAVENOUS | Status: AC
  Administered 2024-01-16: 500 mL via INTRAVENOUS

## 2024-01-16 NOTE — ED Notes (Signed)
 Attempt to call charge nurse [place], no answer

## 2024-01-16 NOTE — ED Notes (Signed)
 MRI called to ask the questions and stated they will put in for transport to MRI.

## 2024-01-16 NOTE — ED Triage Notes (Signed)
 Pt w wife. Wife advises that PCP said to bring him to the hospital, hasn't eaten much x72hrs. Trouble swallowing, spits up phlegm every time he drinks something, losing a lot of weight. Also advises PT told her to call 911 yesterday, concerned for stroke/ dysphagia. Wife states she did not call 911 or have pt evaluated  Wife wants barium swallow screen

## 2024-01-16 NOTE — ED Triage Notes (Signed)
 PT transferred from [facility] for dysphagia. PT sent here for MRI. PT has been losing weight.  PT yesterday suggested that wife call 911 for stroke concerns. Today PCP advised pt go to [facility] for weight loss and weakness.   105/75 HR 60 96% RR 16, GCS 15

## 2024-01-16 NOTE — Plan of Care (Signed)
 Discussed with Dr. Caron Salt via phone  In brief this is a 89 year old gentleman with a past medical history significant for atrial flutter on Eliquis , previous hypertension now hypotensive on midodrine , hyperlipidemia (refuses statin), CKD stage IIIb, CABG x 4, aortic valve replacement, advanced prostate cancer (with secondary and unspecified malignant neoplasm of lymph nodes of multiple regions, chronic bladder dysfunction requiring self-catheterization) now presenting for generalized weakness and trouble swallowing with choking/coughing after everything he eats for the last 72 hours  Per cardiology notes was active 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  In cardiology notes on 08/04/2023 he was noted to have ongoing weight loss, for which his Eliquis  was dropped to 2.5 mg twice daily.  He was also started on midodrine  at that time due to low blood pressure  Per ED provider other than slurred speech and some generalized weakness the patient's examination is reassuring  Unclear if this is progressive failure to thrive, but do think it is prudent to rule out stroke or acute cervical spine process; given malignancy history recommend contrasted scans  Recommend MRI brain with and without contrast MRI cervical spine with and without contrast Appreciate medical workup per ED Please reach out to neurology for full consultation if felt to be needed after the scans are completed

## 2024-01-16 NOTE — ED Notes (Signed)
Pt has urinal at bedside 

## 2024-01-16 NOTE — Plan of Care (Signed)
 Patient earlier discussed with Dr. Jerrie over the phone by the ED provider from [facility].  MRI brain and MRI C-spine recommended.  MRI brain with a questionable subacute left occipital stroke.  He is already on Eliquis  at this time.  No further stroke workup needed.  Continue Eliquis . From the perspective of his general health decline, may need therapy evaluations and medical management for failure to thrive. Inpatient neurology will be available as needed Plan was discussed with Dr. Raoul, ED resident physician  -- Eligio Lav, MD Neurologist [facility]

## 2024-01-16 NOTE — ED Provider Notes (Signed)
 Physical Exam  BP 105/83 (BP Location: Right Arm)   Pulse 63   Temp (!) 97.5 F (36.4 C) (Oral)   Resp 17   SpO2 95%   Physical Exam Constitutional:      General: He is not in acute distress.    Appearance: He is underweight. He is not toxic-appearing.  HENT:     Head: Normocephalic and atraumatic.     Mouth/Throat:     Mouth: Mucous membranes are moist.     Pharynx: Oropharynx is clear.  Cardiovascular:     Rate and Rhythm: Normal rate.  Pulmonary:     Effort: Pulmonary effort is normal. No respiratory distress.  Abdominal:     General: Abdomen is flat.     Palpations: Abdomen is soft.     Tenderness: There is no abdominal tenderness. There is no guarding.  Musculoskeletal:        General: Normal range of motion.     Right lower leg: No edema.     Left lower leg: No edema.  Skin:    General: Skin is warm and dry.  Neurological:     Mental Status: He is alert.     ED Course / MDM   Clinical Course as of 01/16/24 2202  Mon Jan 16, 2024  1342 Spoke with neurology, Dr bhagget recommending MRI brain and C-spine with and without contrast due to malignancy history [TY]  2025 MR Brain W and Wo Contrast No acute intracranial abnormality or abnormal enhancement. Abnormal diffusion without adc correlate in the left occipital white matter, possibly a subacute ischemic lesion. Mild volume loss and chronic small vessel disease.   [AD]  2025 MR Cervical Spine W and Wo Contrast Severe right foraminal stenosis at C5-6 and mild right/moderate left foraminal stenosis at C6-7. [AD]  2136 Spoke with [facility] neurology team regarding patient's MRI results who stated findings are likely subacute/near chronic in nature and are unlikely to be the etiology of patient's <1 week of dysarthria/dysphagia. State from their perspective no indication for admission/further workup, patient also already on eliquis  [AD]    Clinical Course User Index [AD] Raoul Rake, MD [TY] Neysa Caron PARAS, DO    Medical Decision Making Patient presenting from [facility] ED for completion of MRI brain/C-spine for further evaluation of acute on chronic weight loss in setting of 4 days of increased dysarthria/dysphagia.  On my repeat evaluation patient appears underweight but in no significant distress, patient's wife at bedside states that he continues to have slightly slurred speech from his baseline, though she states that he has had the symptoms for several weeks rather than several days on further questioning.  Patient and his wife report that he has not been experiencing difficulty swallowing liquids or soft foods such as yogurt or porridge at home this week, and patient's wife was considering buying boost shakes for him.  He reports difficulty only with swallowing solids and patient's wife states that he has been having these issues for more than 1 week, he has seen his PCP for this issue earlier this week and they had ordered a outpatient swallow study to be scheduled soon. Patient's MRIs were completed and resulted with the above followings.  Neurology team was consulted given possible subacute ischemic L occipital findings.  As above, neurology team recommended no further testing or admission at this time. Patient and his wife were updated and patient tolerated po trial/bedside nursing swallow screen of applesauce and water. Patient deemed stable for discharge with further  outpatient workup/management and recommendations were given to supplement diet with protein shakes and other nutrition-boosting soft foods as tolerated.  Amount and/or Complexity of Data Reviewed Labs: ordered. Radiology: ordered. Decision-making details documented in ED Course.  Risk Prescription drug management.       Raoul Rake, MD 01/16/24 2202    Bernard Drivers, MD 01/30/24 [phone]

## 2024-01-16 NOTE — ED Notes (Signed)
Lauren with [facility] called for transport 

## 2024-01-16 NOTE — ED Notes (Signed)
 Patient transported to MRI

## 2024-01-16 NOTE — ED Notes (Signed)
 Patient transported to CT

## 2024-01-16 NOTE — ED Provider Notes (Signed)
 [place] [facility]ERGENCY DEPARTMENT AT [name] Provider Note   CSN: [id number] Arrival date & time: 01/16/24  1116     Patient presents with: Dysphagia   [name] [name] is a 89 y.o. male.   This is a 89 year old male presenting emergency department with generalized weakness x 1 month with dysphagia and difficulty swallowing with dysarthria times the past 4 days.  Patient's wife notes that he has had some minor difficulty with eating over the past month and a half has lost close to 10 pounds due to decreased p.o. intake.  However, his dysphagia has acutely worsened in the past 3 to 4 days with noticeable dysarthria.  No headache, vision changes, facial droop, unilateral weakness.  Wife notes several falls over the past several weeks.  Denies any pain currently.  No reported focal deficits.        Prior to Admission medications   Medication Sig Start Date End Date Taking? Authorizing Provider  apixaban  (ELIQUIS ) 2.5 MG TABS tablet Take 1 tablet (2.5 mg total) by mouth 2 (two) times daily. 08/04/23   Bensimhon, Toribio SAUNDERS, MD  apixaban  (ELIQUIS ) 5 MG TABS tablet Take 1 tablet by mouth twice daily 10/03/23   [place], Peter M, MD  ascorbic acid (VITAMIN C) 500 MG tablet Take 500 mg by mouth daily.    [provider]  aspirin  81 MG tablet Take 81 mg by mouth at bedtime.     [provider]  B Complex Vitamins (B COMPLEX 100 PO) Take 1 tablet by mouth daily.    [provider]  Biotin 5000 MCG TABS Take 5,000 mcg by mouth daily.    [provider]  Calcium  Carb-Cholecalciferol (CALCIUM  + VITAMIN D3 PO) Take 1 tablet by mouth daily.    [provider]  cholecalciferol (VITAMIN D3) 25 MCG (1000 UNIT) tablet Take 1,000 Units by mouth daily.    [provider]  Coenzyme Q10 (CO Q-10) 100 MG CAPS Take 100 mg by mouth daily.    [provider]  docusate sodium  (COLACE) 100 MG capsule Take 100 mg by mouth daily.    [provider]  dutasteride  (AVODART ) 0.5 MG capsule Take 0.5 mg by mouth daily.    [provider]  feeding supplement, ENSURE COMPLETE, (ENSURE COMPLETE) LIQD Take 237 mLs by mouth daily.    [provider]  ferrous sulfate 325 (65 FE) MG tablet Take 325 mg by mouth daily.    [provider]  furosemide  (LASIX ) 40 MG tablet Take 1 tablet by mouth twice daily Patient taking differently: Take 40 mg by mouth daily. 03/01/23   [place], Peter M, MD  magnesium  oxide (MAG-OX) 400 (240 Mg) MG tablet Take 400 mg by mouth daily.    [provider]  Methylcellulose, Laxative, (FIBER THERAPY PO) Take 2 capsules by mouth daily.    [provider]  midodrine  (PROAMATINE ) 2.5 MG tablet TAKE 1 TABLET BY MOUTH TWICE DAILY WITH A MEAL 10/11/23   Bensimhon, Toribio SAUNDERS, MD  mirtazapine  (REMERON ) 7.5 MG tablet Take 7.5 mg by mouth at bedtime. 09/30/22   [provider]  Misc Natural Products (JOINT HEALTH PO) Take 1 tablet by mouth daily.    [provider]  Misc Natural Products (PROSTATE COMPLETE PO) Take 1 capsule by mouth daily.    [provider]  Multiple Vitamins-Minerals (CENTRUM SILVER PO) Take 1 tablet by mouth daily.     [provider]  Multiple Vitamins-Minerals (LUTEIN-ZEAXANTHIN PO) Take 1  tablet by mouth daily.    [provider]  ORGOVYX 120 MG tablet Take 120 mg by mouth daily.    [provider]  OVER THE COUNTER MEDICATION Take 1 capsule by mouth daily. Neuriva    [provider]  Potassium 99 MG TABS Take 99 mg by mouth daily.    [provider]  Saw Palmetto  450 MG CAPS Take 450 mg by mouth daily.    [provider]  sennosides-docusate sodium  (SENOKOT-S) 8.6-50 MG tablet Take 2 tablets by mouth daily.    [provider]  sulfaSALAzine  (AZULFIDINE ) 500 MG tablet Take 2,000 mg by mouth daily. 08/15/20   [provider]  tamsulosin  (FLOMAX ) 0.4 MG CAPS Take 0.4 mg by mouth 2  (two) times daily.     [provider]  TURMERIC CURCUMIN PO Take 1,500 mg by mouth daily.    [provider]    Allergies: Simvastatin    Review of Systems  Updated Vital Signs BP 111/73 (BP Location: Right Arm)   Pulse (!) 54   Temp 98.1 F (36.7 C) (Oral)   Resp 18   SpO2 95%   Physical Exam Vitals and nursing note reviewed.  Constitutional:      General: He is not in acute distress. HENT:     Head: Normocephalic.     Nose: Nose normal.     Mouth/Throat:     Mouth: Mucous membranes are moist.  Eyes:     Conjunctiva/sclera: Conjunctivae normal.  Cardiovascular:     Rate and Rhythm: Normal rate and regular rhythm.  Pulmonary:     Effort: Pulmonary effort is normal.     Breath sounds: Normal breath sounds.  Abdominal:     General: Abdomen is flat. There is no distension.     Palpations: Abdomen is soft.     Tenderness: There is no abdominal tenderness. There is no guarding or rebound.  Musculoskeletal:     Comments: Bruising to the upper left chest wall.  No other bony tenderness.  Skin:    Capillary Refill: Capillary refill takes less than 2 seconds.  Neurological:     Mental Status: He is alert and oriented to person, place, and time.     Cranial Nerves: No cranial nerve deficit.     Comments: Patient is dysarthric, but no aphasia.  No facial droop.  No visual field deficits.  Equal grip strength.  No leg drift.  Coordinated movements  Psychiatric:        Mood and Affect: Mood normal.        Behavior: Behavior normal.     (all labs ordered are listed, but only abnormal results are displayed) Labs Reviewed  CBC WITH DIFFERENTIAL/PLATELET - Abnormal; Notable for the following components:      Result Value   RBC 4.20 (*)    Hemoglobin 12.9 (*)    All other components within normal limits  COMPREHENSIVE METABOLIC PANEL WITH GFR - Abnormal; Notable for the following components:   Glucose, Bld 169 (*)    BUN 37 (*)    Creatinine, Ser 1.80 (*)     GFR, Estimated 35 (*)    All other components within normal limits  CBG MONITORING, ED - Abnormal; Notable for the following components:   Glucose-Capillary 160 (*)    All other components within normal limits  LIPASE, BLOOD  URINALYSIS, ROUTINE W REFLEX MICROSCOPIC    EKG: EKG Interpretation Date/Time:  Monday January 16 2024 11:35:46 EDT Ventricular Rate:  74 PR Interval:    QRS Duration:  167 QT Interval:  476 QTC Calculation: 529 R Axis:   -66  Text Interpretation: Atrial fibrillation IVCD, consider atypical RBBB Inferior infarct, old Abnormal lateral Q waves Probable anterior infarct, age indeterminate Confirmed by Neysa Clap [phone]) on 01/16/2024 3:00:16 PM  Radiology: CT Head Wo Contrast Result Date: 01/16/2024 CLINICAL DATA:  Blunt head trauma.  Altered mental status. EXAM: CT HEAD WITHOUT CONTRAST TECHNIQUE: Contiguous axial images were obtained from the base of the skull through the vertex without intravenous contrast. RADIATION DOSE REDUCTION: This exam was performed according to the departmental dose-optimization program which includes automated exposure control, adjustment of the mA and/or kV according to patient size and/or use of iterative reconstruction technique. COMPARISON:  12/23/2023 FINDINGS: Brain: No evidence of intracranial hemorrhage, acute infarction, hydrocephalus, extra-axial collection, or mass lesion/mass effect. Old right occipital lobe infarct and bilateral cerebellar infarcts are again seen. Moderate diffuse cerebral atrophy and chronic small vessel disease also unchanged in appearance. Vascular:  No hyperdense vessel or other acute findings. Skull: No evidence of fracture or other significant bone abnormality. Sinuses/Orbits:  No acute findings. Other: None. IMPRESSION: No acute intracranial abnormality. Stable cerebral atrophy, chronic small vessel disease, and old infarcts. Electronically Signed   By: Norleen DELENA Kil M.D.   On: 01/16/2024 14:02   DG Chest  Portable 1 View Result Date: 01/16/2024 CLINICAL DATA:  Fall, difficulty swallowing, weight loss EXAM: PORTABLE CHEST 1 VIEW COMPARISON:  01/12/2024 FINDINGS: Stable reticulonodular interstitial accentuation the right lung base. Prior CABG and aortic valve replacement. Borderline enlargement of the cardiopericardial silhouette. Severe arthropathy of the left glenohumeral joint with eliminated left acromiohumeral distance compatible with chronic rotator cuff tear. Right reverse shoulder arthroplasty. Tortuous thoracic aorta. IMPRESSION: 1. Stable reticulonodular interstitial accentuation in the right lung base favoring chronic infection. 2. Borderline enlargement of the cardiopericardial silhouette. 3. Severe arthropathy of the left glenohumeral joint with chronic rotator cuff tear. Electronically Signed   By: Ryan Salvage M.D.   On: 01/16/2024 13:52     Procedures   Medications Ordered in the ED - No data to display  Clinical Course as of 01/16/24 1500  Mon Jan 16, 2024  1342 Spoke with neurology, Dr bhagget recommending MRI brain and C-spine with and without contrast due to malignancy history [TY]    Clinical Course User Index [TY] Neysa Clap PARAS, DO                                 Medical Decision Making This is a 89 year old male with complicated past medical history to include hypertension hyperlipidemia, prostate cancer, CAD, CHF, A-fib who presented the emergency department with concerns for stroke due to dysarthria and dysphagia.  He is afebrile hemodynamically stable.  Appears to be in rate controlled A-fib.  Wife notes that he is at his baseline mentation, but that his speech is significantly more dysarthric and also noted several falls over the past several weeks.  CT head negative for acute bleed to my independent interpretation.  Chest x-ray without pneumonia pneumothorax my independent interpretation.  Labs largely reassuring, no leukocytosis to suggest systemic infection.   Stable anemia.  No significant metabolic derangements, does have a BUN and creatinine that is elevated, but his last BUN/creatinine was roughly 8 months ago.  Mild AKI?  EKG without ST segment changes to indicate ischemia.  He has no transaminitis or elevated lipase to suggest hepatobiliary  disease.  Given advanced age and his dysarthria concern for possible stroke.  Discussed case with neurology recommending MRI to further evaluate, but also recommending with and without contrast given his cancer history.  Unfortunately, do not have the capability to do MRIs at [facility] today.  Transferred to [facility].  Dr. Trevon in the emergency department has accepted patient.  They will go by transport.  Amount and/or Complexity of Data Reviewed External Data Reviewed:     Details: On Eliquis  Labs: ordered. Radiology: ordered and independent interpretation performed. ECG/medicine tests: independent interpretation performed.    Details: See above Discussion of management or test interpretation with external provider(s): Nuerology; see ed course.   Risk Decision regarding hospitalization. Diagnosis or treatment significantly limited by social determinants of health. Risk Details: Lives at home with wife. Ambulates with walker.        Final diagnoses:  Dysphagia, unspecified type    ED Discharge Orders     None          Neysa Caron PARAS, DO 01/16/24 1500

## 2024-01-16 NOTE — Discharge Instructions (Addendum)
 You were seen today for slurred speech and issues swallowing. While you were here we monitored your vitals, performed a physical exam, and ordered labs/imaging studies. These were all reassuring and there is no indication for any further testing or intervention in the emergency department at this time.   Things to do:  - Follow up with your primary care provider within the next 1-2 weeks, or sooner as needed - Schedule outpatient swallow study as ordered by your PCP as soon as possible - Supplement your diet with liquids/soft fluids that you are able to swallow without difficulty, especially protein shakes, to help with getting adequate nutrition until swallow studies can be performed  Return to the emergency department if you have any new or worsening symptoms, or if you have any other serious medical concerns.

## 2024-01-20 ENCOUNTER — Telehealth (HOSPITAL_COMMUNITY): Payer: Self-pay | Admitting: *Deleted

## 2024-01-20 NOTE — Telephone Encounter (Signed)
 Attempted to schedule swallow test, left VM at [phone] with call back request. (AHARRIS)

## 2024-01-23 ENCOUNTER — Other Ambulatory Visit (HOSPITAL_COMMUNITY): Payer: Self-pay | Admitting: Family Medicine

## 2024-01-23 DIAGNOSIS — R059 Cough, unspecified: Secondary | ICD-10-CM

## 2024-01-23 DIAGNOSIS — R1310 Dysphagia, unspecified: Secondary | ICD-10-CM

## 2024-01-27 ENCOUNTER — Encounter (HOSPITAL_COMMUNITY): Payer: Self-pay

## 2024-01-27 ENCOUNTER — Inpatient Hospital Stay (HOSPITAL_COMMUNITY)
Admission: EM | Admit: 2024-01-27 | Discharge: 2024-01-30 | DRG: 871 | Disposition: A | Discharge status: Hospice | Attending: Family Medicine | Admitting: Family Medicine

## 2024-01-27 ENCOUNTER — Emergency Department (HOSPITAL_COMMUNITY)

## 2024-01-27 ENCOUNTER — Other Ambulatory Visit: Payer: Self-pay

## 2024-01-27 DIAGNOSIS — B9561 Methicillin susceptible Staphylococcus aureus infection as the cause of diseases classified elsewhere: Secondary | ICD-10-CM | POA: Diagnosis not present

## 2024-01-27 DIAGNOSIS — I2510 Atherosclerotic heart disease of native coronary artery without angina pectoris: Secondary | ICD-10-CM | POA: Diagnosis present

## 2024-01-27 DIAGNOSIS — R627 Adult failure to thrive: Secondary | ICD-10-CM | POA: Diagnosis not present

## 2024-01-27 DIAGNOSIS — I719 Aortic aneurysm of unspecified site, without rupture: Secondary | ICD-10-CM | POA: Insufficient documentation

## 2024-01-27 DIAGNOSIS — E870 Hyperosmolality and hypernatremia: Secondary | ICD-10-CM | POA: Diagnosis not present

## 2024-01-27 DIAGNOSIS — A4101 Sepsis due to Methicillin susceptible Staphylococcus aureus: Secondary | ICD-10-CM | POA: Diagnosis not present

## 2024-01-27 DIAGNOSIS — R1319 Other dysphagia: Secondary | ICD-10-CM

## 2024-01-27 DIAGNOSIS — J189 Pneumonia, unspecified organism: Secondary | ICD-10-CM

## 2024-01-27 DIAGNOSIS — I480 Paroxysmal atrial fibrillation: Secondary | ICD-10-CM | POA: Diagnosis present

## 2024-01-27 DIAGNOSIS — L0291 Cutaneous abscess, unspecified: Secondary | ICD-10-CM | POA: Diagnosis not present

## 2024-01-27 DIAGNOSIS — R634 Abnormal weight loss: Secondary | ICD-10-CM | POA: Diagnosis not present

## 2024-01-27 DIAGNOSIS — R338 Other retention of urine: Secondary | ICD-10-CM | POA: Diagnosis present

## 2024-01-27 DIAGNOSIS — I5042 Chronic combined systolic (congestive) and diastolic (congestive) heart failure: Secondary | ICD-10-CM | POA: Diagnosis not present

## 2024-01-27 DIAGNOSIS — I4892 Unspecified atrial flutter: Secondary | ICD-10-CM | POA: Diagnosis not present

## 2024-01-27 DIAGNOSIS — N401 Enlarged prostate with lower urinary tract symptoms: Secondary | ICD-10-CM | POA: Diagnosis present

## 2024-01-27 DIAGNOSIS — J15211 Pneumonia due to Methicillin susceptible Staphylococcus aureus: Secondary | ICD-10-CM | POA: Diagnosis present

## 2024-01-27 DIAGNOSIS — Z85828 Personal history of other malignant neoplasm of skin: Secondary | ICD-10-CM

## 2024-01-27 DIAGNOSIS — Z515 Encounter for palliative care: Secondary | ICD-10-CM

## 2024-01-27 DIAGNOSIS — I872 Venous insufficiency (chronic) (peripheral): Secondary | ICD-10-CM | POA: Diagnosis present

## 2024-01-27 DIAGNOSIS — C61 Malignant neoplasm of prostate: Secondary | ICD-10-CM | POA: Diagnosis not present

## 2024-01-27 DIAGNOSIS — N183 Chronic kidney disease, stage 3 unspecified: Secondary | ICD-10-CM | POA: Diagnosis not present

## 2024-01-27 DIAGNOSIS — A419 Sepsis, unspecified organism: Secondary | ICD-10-CM | POA: Diagnosis present

## 2024-01-27 DIAGNOSIS — I130 Hypertensive heart and chronic kidney disease with heart failure and stage 1 through stage 4 chronic kidney disease, or unspecified chronic kidney disease: Secondary | ICD-10-CM | POA: Diagnosis present

## 2024-01-27 DIAGNOSIS — K1120 Sialoadenitis, unspecified: Secondary | ICD-10-CM

## 2024-01-27 DIAGNOSIS — Z951 Presence of aortocoronary bypass graft: Secondary | ICD-10-CM

## 2024-01-27 DIAGNOSIS — Z681 Body mass index (BMI) 19 or less, adult: Secondary | ICD-10-CM | POA: Diagnosis not present

## 2024-01-27 DIAGNOSIS — Z8679 Personal history of other diseases of the circulatory system: Secondary | ICD-10-CM

## 2024-01-27 DIAGNOSIS — R918 Other nonspecific abnormal finding of lung field: Secondary | ICD-10-CM | POA: Diagnosis not present

## 2024-01-27 DIAGNOSIS — E7849 Other hyperlipidemia: Secondary | ICD-10-CM | POA: Diagnosis not present

## 2024-01-27 DIAGNOSIS — E785 Hyperlipidemia, unspecified: Secondary | ICD-10-CM | POA: Diagnosis present

## 2024-01-27 DIAGNOSIS — M2578 Osteophyte, vertebrae: Secondary | ICD-10-CM | POA: Diagnosis present

## 2024-01-27 DIAGNOSIS — M129 Arthropathy, unspecified: Secondary | ICD-10-CM | POA: Diagnosis not present

## 2024-01-27 DIAGNOSIS — I7121 Aneurysm of the ascending aorta, without rupture: Secondary | ICD-10-CM | POA: Diagnosis not present

## 2024-01-27 DIAGNOSIS — K1121 Acute sialoadenitis: Secondary | ICD-10-CM | POA: Diagnosis present

## 2024-01-27 DIAGNOSIS — Z66 Do not resuscitate: Secondary | ICD-10-CM | POA: Diagnosis present

## 2024-01-27 DIAGNOSIS — Z711 Person with feared health complaint in whom no diagnosis is made: Secondary | ICD-10-CM | POA: Diagnosis not present

## 2024-01-27 DIAGNOSIS — G250 Essential tremor: Secondary | ICD-10-CM | POA: Diagnosis present

## 2024-01-27 DIAGNOSIS — Z471 Aftercare following joint replacement surgery: Secondary | ICD-10-CM | POA: Diagnosis not present

## 2024-01-27 DIAGNOSIS — E876 Hypokalemia: Secondary | ICD-10-CM | POA: Diagnosis not present

## 2024-01-27 DIAGNOSIS — R059 Cough, unspecified: Secondary | ICD-10-CM | POA: Diagnosis not present

## 2024-01-27 DIAGNOSIS — E1122 Type 2 diabetes mellitus with diabetic chronic kidney disease: Secondary | ICD-10-CM | POA: Diagnosis not present

## 2024-01-27 DIAGNOSIS — R7881 Bacteremia: Secondary | ICD-10-CM | POA: Diagnosis present

## 2024-01-27 DIAGNOSIS — J9601 Acute respiratory failure with hypoxia: Secondary | ICD-10-CM | POA: Diagnosis not present

## 2024-01-27 DIAGNOSIS — Z7901 Long term (current) use of anticoagulants: Secondary | ICD-10-CM

## 2024-01-27 DIAGNOSIS — I639 Cerebral infarction, unspecified: Secondary | ICD-10-CM | POA: Diagnosis not present

## 2024-01-27 DIAGNOSIS — R531 Weakness: Secondary | ICD-10-CM | POA: Diagnosis not present

## 2024-01-27 DIAGNOSIS — N1832 Chronic kidney disease, stage 3b: Secondary | ICD-10-CM | POA: Diagnosis present

## 2024-01-27 DIAGNOSIS — Z96611 Presence of right artificial shoulder joint: Secondary | ICD-10-CM | POA: Diagnosis present

## 2024-01-27 DIAGNOSIS — R0902 Hypoxemia: Principal | ICD-10-CM

## 2024-01-27 DIAGNOSIS — Z7982 Long term (current) use of aspirin: Secondary | ICD-10-CM

## 2024-01-27 DIAGNOSIS — Z833 Family history of diabetes mellitus: Secondary | ICD-10-CM

## 2024-01-27 DIAGNOSIS — Z7409 Other reduced mobility: Secondary | ICD-10-CM

## 2024-01-27 DIAGNOSIS — G319 Degenerative disease of nervous system, unspecified: Secondary | ICD-10-CM | POA: Diagnosis not present

## 2024-01-27 DIAGNOSIS — N1831 Chronic kidney disease, stage 3a: Secondary | ICD-10-CM | POA: Diagnosis not present

## 2024-01-27 DIAGNOSIS — R911 Solitary pulmonary nodule: Secondary | ICD-10-CM | POA: Diagnosis present

## 2024-01-27 DIAGNOSIS — Z7189 Other specified counseling: Secondary | ICD-10-CM | POA: Diagnosis not present

## 2024-01-27 DIAGNOSIS — D8481 Immunodeficiency due to conditions classified elsewhere: Secondary | ICD-10-CM | POA: Diagnosis not present

## 2024-01-27 DIAGNOSIS — Z8249 Family history of ischemic heart disease and other diseases of the circulatory system: Secondary | ICD-10-CM

## 2024-01-27 DIAGNOSIS — Z79899 Other long term (current) drug therapy: Secondary | ICD-10-CM

## 2024-01-27 DIAGNOSIS — Z952 Presence of prosthetic heart valve: Secondary | ICD-10-CM | POA: Diagnosis not present

## 2024-01-27 DIAGNOSIS — R1310 Dysphagia, unspecified: Secondary | ICD-10-CM | POA: Diagnosis present

## 2024-01-27 DIAGNOSIS — I9589 Other hypotension: Secondary | ICD-10-CM | POA: Diagnosis present

## 2024-01-27 DIAGNOSIS — I10 Essential (primary) hypertension: Secondary | ICD-10-CM | POA: Diagnosis present

## 2024-01-27 DIAGNOSIS — Z888 Allergy status to other drugs, medicaments and biological substances status: Secondary | ICD-10-CM

## 2024-01-27 DIAGNOSIS — Z96653 Presence of artificial knee joint, bilateral: Secondary | ICD-10-CM | POA: Diagnosis present

## 2024-01-27 LAB — CBC WITH DIFFERENTIAL/PLATELET
Abs Immature Granulocytes: 0.06 K/uL (ref 0.00–0.07)
Basophils Absolute: 0 K/uL (ref 0.0–0.1)
Basophils Relative: 0 %
Eosinophils Absolute: 0.4 K/uL (ref 0.0–0.5)
Eosinophils Relative: 3 %
HCT: 43.7 % (ref 39.0–52.0)
Hemoglobin: 14.4 g/dL (ref 13.0–17.0)
Immature Granulocytes: 0 %
Lymphocytes Relative: 3 %
Lymphs Abs: 0.5 K/uL — ABNORMAL LOW (ref 0.7–4.0)
MCH: 31.1 pg (ref 26.0–34.0)
MCHC: 33 g/dL (ref 30.0–36.0)
MCV: 94.4 fL (ref 80.0–100.0)
Monocytes Absolute: 0.9 K/uL (ref 0.1–1.0)
Monocytes Relative: 6 %
Neutro Abs: 12.9 K/uL — ABNORMAL HIGH (ref 1.7–7.7)
Neutrophils Relative %: 88 %
Platelets: 184 K/uL (ref 150–400)
RBC: 4.63 MIL/uL (ref 4.22–5.81)
RDW: 13.9 % (ref 11.5–15.5)
WBC: 14.7 K/uL — ABNORMAL HIGH (ref 4.0–10.5)
nRBC: 0 % (ref 0.0–0.2)

## 2024-01-27 LAB — URINALYSIS, W/ REFLEX TO CULTURE (INFECTION SUSPECTED)
Bilirubin Urine: NEGATIVE
Glucose, UA: NEGATIVE mg/dL
Hgb urine dipstick: NEGATIVE
Ketones, ur: NEGATIVE mg/dL
Nitrite: NEGATIVE
Protein, ur: NEGATIVE mg/dL
Specific Gravity, Urine: 1.015 (ref 1.005–1.030)
pH: 5 (ref 5.0–8.0)

## 2024-01-27 LAB — COMPREHENSIVE METABOLIC PANEL WITH GFR
ALT: 15 U/L (ref 0–44)
AST: 28 U/L (ref 15–41)
Albumin: 3.2 g/dL — ABNORMAL LOW (ref 3.5–5.0)
Alkaline Phosphatase: 74 U/L (ref 38–126)
Anion gap: 14 (ref 5–15)
BUN: 43 mg/dL — ABNORMAL HIGH (ref 8–23)
CO2: 26 mmol/L (ref 22–32)
Calcium: 9.1 mg/dL (ref 8.9–10.3)
Chloride: 102 mmol/L (ref 98–111)
Creatinine, Ser: 1.59 mg/dL — ABNORMAL HIGH (ref 0.61–1.24)
GFR, Estimated: 40 mL/min — ABNORMAL LOW (ref >60)
Glucose, Bld: 180 mg/dL — ABNORMAL HIGH (ref 70–99)
Potassium: 3.8 mmol/L (ref 3.5–5.1)
Sodium: 142 mmol/L (ref 135–145)
Total Bilirubin: 2.2 mg/dL — ABNORMAL HIGH (ref 0.0–1.2)
Total Protein: 7.5 g/dL (ref 6.5–8.1)

## 2024-01-27 LAB — HEPARIN LEVEL (UNFRACTIONATED): Heparin Unfractionated: 0.5 [IU]/mL (ref 0.30–0.70)

## 2024-01-27 LAB — CK: Total CK: 30 U/L — ABNORMAL LOW (ref 49–397)

## 2024-01-27 LAB — APTT: aPTT: 33 s (ref 24–36)

## 2024-01-27 LAB — LACTIC ACID, PLASMA
Lactic Acid, Venous: 1.5 mmol/L (ref 0.5–1.9)
Lactic Acid, Venous: 1.6 mmol/L (ref 0.5–1.9)

## 2024-01-27 LAB — PROCALCITONIN: Procalcitonin: 1.14 ng/mL

## 2024-01-27 MED ORDER — DEXTROSE IN LACTATED RINGERS 5 % IV SOLN: INTRAVENOUS | Status: AC

## 2024-01-27 MED ORDER — ACETAMINOPHEN 650 MG RE SUPP: 650.0000 mg | Freq: Four times a day (QID) | RECTAL | Status: DC | PRN

## 2024-01-27 MED ORDER — ONDANSETRON HCL 4 MG/2ML IJ SOLN: 4.0000 mg | Freq: Four times a day (QID) | INTRAMUSCULAR | Status: DC | PRN

## 2024-01-27 MED ORDER — IPRATROPIUM-ALBUTEROL 0.5-2.5 (3) MG/3ML IN SOLN: 3.0000 mL | Freq: Four times a day (QID) | RESPIRATORY_TRACT | Status: DC | PRN

## 2024-01-27 MED ORDER — HEPARIN (PORCINE) 25000 UT/250ML-% IV SOLN
900.0000 [IU]/h | INTRAVENOUS | Status: DC
  Administered 2024-01-28: 900 [IU]/h via INTRAVENOUS
  Administered 2024-01-29: 800 [IU]/h via INTRAVENOUS
  Filled 2024-01-27 (×2): qty 250

## 2024-01-27 MED ORDER — METOPROLOL TARTRATE 5 MG/5ML IV SOLN: 5.0000 mg | INTRAVENOUS | Status: DC | PRN

## 2024-01-27 MED ORDER — SODIUM CHLORIDE 0.9% FLUSH: 3.0000 mL | INTRAVENOUS | Status: DC | PRN

## 2024-01-27 MED ORDER — ONDANSETRON HCL 4 MG PO TABS
4.0000 mg | ORAL_TABLET | Freq: Four times a day (QID) | ORAL | Status: DC | PRN
Start: 2024-01-27 — End: 2024-01-30

## 2024-01-27 MED ORDER — ACETAMINOPHEN 325 MG PO TABS: 650.0000 mg | ORAL_TABLET | Freq: Four times a day (QID) | ORAL | Status: DC | PRN

## 2024-01-27 MED ORDER — SODIUM CHLORIDE 0.9 % IV SOLN: 250.0000 mL | INTRAVENOUS | Status: AC | PRN

## 2024-01-27 MED ORDER — SODIUM CHLORIDE 0.9% FLUSH
3.0000 mL | Freq: Two times a day (BID) | INTRAVENOUS | Status: DC
  Administered 2024-01-28 (×2): 3 mL via INTRAVENOUS

## 2024-01-27 MED ORDER — SODIUM CHLORIDE 0.9 % IV SOLN
2.0000 g | INTRAVENOUS | Status: DC
  Administered 2024-01-28: 2 g via INTRAVENOUS
  Filled 2024-01-27: qty 20

## 2024-01-27 MED ORDER — PHENOL 1.4 % MT LIQD
1.0000 | OROMUCOSAL | Status: DC | PRN
  Administered 2024-01-30: 1 via OROMUCOSAL
  Filled 2024-01-27 (×2): qty 177

## 2024-01-27 MED ORDER — DEXTROSE-SODIUM CHLORIDE 5-0.45 % IV SOLN: INTRAVENOUS | Status: DC

## 2024-01-27 MED ORDER — SODIUM CHLORIDE 0.9 % IV SOLN
500.0000 mg | Freq: Once | INTRAVENOUS | Status: AC
  Administered 2024-01-27: 500 mg via INTRAVENOUS
  Filled 2024-01-27: qty 5

## 2024-01-27 MED ORDER — VANCOMYCIN HCL IN DEXTROSE 1-5 GM/200ML-% IV SOLN: 1000.0000 mg | INTRAVENOUS | Status: DC

## 2024-01-27 MED ORDER — SODIUM CHLORIDE 0.9 % IV BOLUS
1000.0000 mL | Freq: Once | INTRAVENOUS | Status: AC
  Administered 2024-01-27: 1000 mL via INTRAVENOUS

## 2024-01-27 MED ORDER — VANCOMYCIN HCL 1250 MG/250ML IV SOLN
1250.0000 mg | Freq: Once | INTRAVENOUS | Status: DC
  Filled 2024-01-27: qty 250

## 2024-01-27 MED ORDER — IOHEXOL 350 MG/ML SOLN
75.0000 mL | Freq: Once | INTRAVENOUS | Status: AC | PRN
  Administered 2024-01-27: 75 mL via INTRAVENOUS

## 2024-01-27 MED ORDER — SODIUM CHLORIDE 0.9 % IV SOLN
1.0000 g | Freq: Once | INTRAVENOUS | Status: AC
  Administered 2024-01-27: 1 g via INTRAVENOUS
  Filled 2024-01-27: qty 10

## 2024-01-27 NOTE — ED Triage Notes (Signed)
 [facility] reports pt coming from home. Wife states the last 24 hours pt has not been doing much, has had a productive cough for the past few weeks, and a abscess to the left jaw area. Wife is unable to take him to the doctor. Upon EMS arrival pulse ox 88% on RA.

## 2024-01-27 NOTE — H&P (Addendum)
 History and Physical    [name] [name] FMW:[id number] DOB: [birth year] DOA: 01/27/2024  PCP: Aisha Harvey, MD   Patient coming from: Home   Chief Complaint:  Chief Complaint  Patient presents with   Abscess   ED TRIAGE note:  [facility] reports pt coming from home. Wife states the last 24 hours pt has not been doing much, has had a productive cough for the past few weeks, and a abscess to the left jaw area. Wife is unable to take him to the doctor. Upon EMS arrival pulse ox 88% on RA.       HPI:  [name] [name] is a 89 y.o. 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 to 35%, aortic stenosis s/p TAVR, CAD status post CABG, atrial fibrillation and flutter on Eliquis , CKD stage3a, chronic venous insufficiency of bilateral lower extremities, essential hypertension, chronic urinary retention-self cath as needed, hyperlipidemia, peripheral neuropathy, advanced prostate cancer, arthritis, and essential tremor presented to emergency department complaining of clinical deterioration over the course of last 4 days with associated cough, immobilization (patient usually walks with cane and walker but cannot get out of the bed due to generalized weakness), and patient also found hypoxia 80 to 84% at home not using any home oxygen  at baseline also painful left mandibular swelling concerning for abscess.  Wife reported that patient has some dysphagia and he has been scheduled for swallow study on 8/4 which has been interfering patient's nutrition for last 2 months causing 40 pound weight loss. Patient denies any fever, chill, nausea, vomiting, constipation or diarrhea.  During my evaluation at the bedside patient is alert and responding to pain.  Unable to assess for orientation.  Patient seems lethargic.   ED Course:  At presentation to ED patient requiring as requirement of oxygen  which is new O2 sat maintaining 91 to 94% on 3 L, low-grade fever and tachypneic.  Blood pressure  within good range. CBC showing leukocytosis 14.7 otherwise unremarkable. CMP showing creatinine 1.59 and GFR 40 prerenal pressure at baseline.  Otherwise unremarkable. Normal lactic acid level.  Blood cultures are in process.  EKG showed atrial fibrillation rate controlled heart rate 66.  Chest x-ray showed cluster of pulmonary nodule in the right lung baseand right middle lobe more conspicuous to prior. Correlate with chest CT findings in this patient with known malignancy. Enlarged cardiomediastinal silhouette, stable to prior.  Pending CT chest.  CT maxillofacial showed evidence for left parotitis.  No evidence of abscess. IMPRESSION: 1. Streak/beam hardening artifact arising from dental restoration partially obscures the oral cavity and perioral soft tissues. Within this limitation, findings are as follows. 2. Findings consistent with left parotitis. No evidence of a periparotid abscess. 3. Periapical lucency (consistent with periodontal disease) surrounding the left mandibular second molar tooth. No appreciable surrounding soft tissue inflammatory changes or soft tissue abscess. 4. Minor left maxillary sinus mucosal thickening.  In the ED patient received azithromycin  and ceftriaxone  as well as 1 L of LR bolus.  Pending CT chest findings.  Patient's wife reported that patient unable to swallow any food at home and has been scheduled for swallow study 8/4.  In the ED patient failed swallow screen due to significant dysphagia.  In the ED patient received ceftriaxone  2 g, azithromycin  500 mg and 1 L of NS bolus.  Hospitalist has been consulted for further evaluation management of  pneumonia (pending CT chest), acute hypoxic respiratory failure-secondary to pneumonia left-sided parotitis, dysphagia-need inpatient swallow study and failure to thrive  in adults.    Significant labs in the ED: Lab Orders         Blood culture (routine x 2)         Culture, blood (routine x 2) Call MD  if unable to obtain prior to antibiotics being given         Expectorated Sputum Assessment w Gram Stain, Rflx to Resp Cult         Respiratory (~20 pathogens) panel by PCR         MRSA Next Gen by PCR, Nasal         CBC with Differential         Comprehensive metabolic panel         Urinalysis, w/ Reflex to Culture (Infection Suspected) -Urine, Clean Catch         Lactic acid, plasma         CK         Legionella Pneumophila Serogp 1 Ur Ag         Strep pneumoniae urinary antigen         Procalcitonin         Comprehensive metabolic panel         CBC         APTT         APTT         APTT         Heparin  level (unfractionated)         Heparin  level (unfractionated)         Heparin  level (unfractionated)       Review of Systems:  Review of Systems  Constitutional:  Positive for chills, malaise/fatigue and weight loss. Negative for fever.  Respiratory:  Positive for cough, sputum production and shortness of breath. Negative for hemoptysis and wheezing.   Cardiovascular:  Negative for chest pain and palpitations.  Gastrointestinal:  Negative for abdominal pain, diarrhea, heartburn, nausea and vomiting.  Genitourinary:  Negative for dysuria and urgency.  Musculoskeletal:  Negative for back pain, falls, joint pain, myalgias and neck pain.  Neurological:  Negative for dizziness and headaches.  Psychiatric/Behavioral:  The patient is not nervous/anxious.     Past Medical History:  Diagnosis Date   Aortic stenosis    Arthritis    CAD (coronary artery disease)    CHF (congestive heart failure) (HCC)    Colitis    Edema    Essential hypertension    no med now   Essential tremor    Hyperlipidemia    Hypotonic bladder    L4-L5   Obstructive uropathy    Peripheral neuropathy    Elevated by Dr. love 2010   Prostate cancer [facility]) 2005    Past Surgical History:  Procedure Laterality Date   ACHILLES TENDON SURGERY Bilateral 1995   AORTIC VALVE REPLACEMENT N/A 05/23/2020    Procedure: AORTIC VALVE REPLACEMENT (AVR) USING EDWARDS RESILIA 29 MM AORTIC VALVE.;  Surgeon: Lucas Dorise POUR, MD;  Location: [facility] OR;  Service: Open Heart Surgery;  Laterality: N/A;   CARDIAC CATHETERIZATION     CARDIOVERSION N/A 10/07/2022   Procedure: CARDIOVERSION;  Surgeon: Cherrie Toribio SAUNDERS, MD;  Location: [facility] INVASIVE CV LAB;  Service: Cardiovascular;  Laterality: N/A;   COLONOSCOPY     With polyp resection   CORONARY ARTERY BYPASS GRAFT N/A 05/23/2020   Procedure: CORONARY ARTERY BYPASS GRAFTING (CABG) USING LIMA to LAD; ENDOSCOPIC HARVESTED RIGHT GREATER SAPHENOUS VEIN: SVG to PD; SVG to SEQUENCED INTERMEDIATE to  OM;  Surgeon: Lucas Dorise POUR, MD;  Location: [facility] OR;  Service: Open Heart Surgery;  Laterality: N/A;   ENDOVEIN HARVEST OF GREATER SAPHENOUS VEIN Right 05/23/2020   Procedure: ENDOVEIN HARVEST OF GREATER SAPHENOUS VEIN;  Surgeon: Lucas Dorise POUR, MD;  Location: [facility] OR;  Service: Open Heart Surgery;  Laterality: Right;   REVERSE SHOULDER ARTHROPLASTY Right 06/04/2016   REVERSE SHOULDER ARTHROPLASTY Right 06/04/2016   Procedure: REVERSE SHOULDER ARTHROPLASTY;  Surgeon: Marcey Her, MD;  Location: [facility] OR;  Service: Orthopedics;  Laterality: Right;   RIGHT/LEFT HEART CATH AND CORONARY ANGIOGRAPHY N/A 05/20/2020   Procedure: RIGHT/LEFT HEART CATH AND CORONARY ANGIOGRAPHY;  Surgeon: Swazil[place], Peter M, MD;  Location: [facility] INVASIVE CV LAB;  Service: Cardiovascular;  Laterality: N/A;   SKIN CANCER EXCISION  1974   On chest wall   TEE WITHOUT CARDIOVERSION N/A 05/23/2020   Procedure: TRANSESOPHAGEAL ECHOCARDIOGRAM (TEE);  Surgeon: Lucas Dorise POUR, MD;  Location: [facility] OR;  Service: Open Heart Surgery;  Laterality: N/A;   TOTAL KNEE ARTHROPLASTY Right 2011   TOTAL KNEE ARTHROPLASTY Left 04/09/2013   Procedure: LEFT TOTAL KNEE ARTHROPLASTY;  Surgeon: Dempsey LULLA Moan, MD;  Location: [facility] ORS;  Service: Orthopedics;  Laterality: Left;     reports that he has never smoked. He has never used smokeless  tobacco. He reports current alcohol  use of about 1.0 standard drink of alcohol  per week. He reports that he does not use drugs.  Allergies  Allergen Reactions   Simvastatin Other (See Comments)    caused neuropathy pain    Family History  Problem Relation Age of Onset   Hypertension Mother    Heart failure Mother    Diabetes Mother    Heart attack Brother    Heart attack Maternal Aunt     Prior to Admission medications   Medication Sig Start Date End Date Taking? Authorizing Provider  apixaban  (ELIQUIS ) 2.5 MG TABS tablet Take 1 tablet (2.5 mg total) by mouth 2 (two) times daily. 08/04/23   Bensimhon, Toribio SAUNDERS, MD  apixaban  (ELIQUIS ) 5 MG TABS tablet Take 1 tablet by mouth twice daily 10/03/23   [place], Peter M, MD  ascorbic acid (VITAMIN C) 500 MG tablet Take 500 mg by mouth daily.    [provider]  aspirin  81 MG tablet Take 81 mg by mouth at bedtime.     [provider]  B Complex Vitamins (B COMPLEX 100 PO) Take 1 tablet by mouth daily.    [provider]  Biotin 5000 MCG TABS Take 5,000 mcg by mouth daily.    [provider]  Calcium  Carb-Cholecalciferol (CALCIUM  + VITAMIN D3 PO) Take 1 tablet by mouth daily.    [provider]  cholecalciferol (VITAMIN D3) 25 MCG (1000 UNIT) tablet Take 1,000 Units by mouth daily.    [provider]  Coenzyme Q10 (CO Q-10) 100 MG CAPS Take 100 mg by mouth daily.    [provider]  docusate sodium  (COLACE) 100 MG capsule Take 100 mg by mouth daily.    [provider]  dutasteride  (AVODART ) 0.5 MG capsule Take 0.5 mg by mouth daily.    [provider]  feeding supplement, ENSURE COMPLETE, (ENSURE COMPLETE) LIQD Take 237 mLs by mouth daily.    [provider]  ferrous sulfate 325 (65 FE) MG tablet Take 325 mg by mouth daily.    [provider]  furosemide  (LASIX ) 40 MG tablet Take 1 tablet by mouth twice daily Patient taking differently: Take  40 mg  by mouth daily. 03/01/23   [place], Peter M, MD  magnesium  oxide (MAG-OX) 400 (240 Mg) MG tablet Take 400 mg by mouth daily.    [provider]  Methylcellulose, Laxative, (FIBER THERAPY PO) Take 2 capsules by mouth daily.    [provider]  midodrine  (PROAMATINE ) 2.5 MG tablet TAKE 1 TABLET BY MOUTH TWICE DAILY WITH A MEAL 10/11/23   Bensimhon, Toribio SAUNDERS, MD  mirtazapine  (REMERON ) 7.5 MG tablet Take 7.5 mg by mouth at bedtime. 09/30/22   [provider]  Misc Natural Products (JOINT HEALTH PO) Take 1 tablet by mouth daily.    [provider]  Misc Natural Products (PROSTATE COMPLETE PO) Take 1 capsule by mouth daily.    [provider]  Multiple Vitamins-Minerals (CENTRUM SILVER PO) Take 1 tablet by mouth daily.     [provider]  Multiple Vitamins-Minerals (LUTEIN-ZEAXANTHIN PO) Take 1 tablet by mouth daily.    [provider]  ORGOVYX 120 MG tablet Take 120 mg by mouth daily.    [provider]  OVER THE COUNTER MEDICATION Take 1 capsule by mouth daily. Neuriva    [provider]  Potassium 99 MG TABS Take 99 mg by mouth daily.    [provider]  Saw Palmetto  450 MG CAPS Take 450 mg by mouth daily.    [provider]  sennosides-docusate sodium  (SENOKOT-S) 8.6-50 MG tablet Take 2 tablets by mouth daily.    [provider]  sulfaSALAzine  (AZULFIDINE ) 500 MG tablet Take 2,000 mg by mouth daily. 08/15/20   [provider]  tamsulosin  (FLOMAX ) 0.4 MG CAPS Take 0.4 mg by mouth 2 (two) times daily.     [provider]  TURMERIC CURCUMIN PO Take 1,500 mg by mouth daily.    [provider]     Physical Exam: Vitals:   01/28/24 0055 01/28/24 0100 01/28/24 0120 01/28/24 0130  BP:  121/73 120/74   Pulse: 79 70 69 77  Resp: (!) 24 (!) 29    Temp:      TempSrc:      SpO2: 94% 96% 92% 93%  Weight:    59.9 kg  Height:    5' 9 (1.753 m)    Physical Exam Vitals and  nursing note reviewed.  Constitutional:      General: He is not in acute distress.    Appearance: He is ill-appearing.  HENT:     Mouth/Throat:     Mouth: Mucous membranes are moist.  Eyes:     Pupils: Pupils are equal, round, and reactive to light.  Cardiovascular:     Rate and Rhythm: Normal rate and regular rhythm.     Pulses: Normal pulses.     Heart sounds: Normal heart sounds.  Pulmonary:     Effort: Pulmonary effort is normal.     Breath sounds: Normal breath sounds.  Abdominal:     General: Bowel sounds are normal.  Musculoskeletal:     Cervical back: Neck supple.     Right lower leg: No edema.     Left lower leg: No edema.  Skin:    General: Skin is warm.     Capillary Refill: Capillary refill takes less than 2 seconds.  Neurological:     Comments: Alert and responsive to pain  Psychiatric:        Mood and Affect: Mood normal.      Labs on Admission: I have personally reviewed following labs and imaging  studies  CBC: Recent Labs  Lab 01/27/24 1720  WBC 14.7*  NEUTROABS 12.9*  HGB 14.4  HCT 43.7  MCV 94.4  PLT 184   Basic Metabolic Panel: Recent Labs  Lab 01/27/24 1720  NA 142  K 3.8  CL 102  CO2 26  GLUCOSE 180*  BUN 43*  CREATININE 1.59*  CALCIUM  9.1   GFR: Estimated Creatinine Clearance: 24.6 mL/min (A) (by C-G formula based on SCr of 1.59 mg/dL (H)). Liver Function Tests: Recent Labs  Lab 01/27/24 1720  AST 28  ALT 15  ALKPHOS 74  BILITOT 2.2*  PROT 7.5  ALBUMIN  3.2*   No results for input(s): LIPASE, AMYLASE in the last 168 hours. No results for input(s): AMMONIA in the last 168 hours. Coagulation Profile: No results for input(s): INR, PROTIME in the last 168 hours. Cardiac Enzymes: Recent Labs  Lab 01/27/24 2212  CKTOTAL 30*   BNP (last 3 results) No results for input(s): BNP in the last 8760 hours. HbA1C: No results for input(s): HGBA1C in the last 72 hours. CBG: No results for input(s): GLUCAP in the  last 168 hours. Lipid Profile: No results for input(s): CHOL, HDL, LDLCALC, TRIG, CHOLHDL, LDLDIRECT in the last 72 hours. Thyroid  Function Tests: No results for input(s): TSH, T4TOTAL, FREET4, T3FREE, THYROIDAB in the last 72 hours. Anemia Panel: No results for input(s): VITAMINB12, FOLATE, FERRITIN, TIBC, IRON, RETICCTPCT in the last 72 hours. Urine analysis:    Component Value Date/Time   COLORURINE AMBER (A) 01/27/2024 1802   APPEARANCEUR HAZY (A) 01/27/2024 1802   LABSPEC 1.015 01/27/2024 1802   PHURINE 5.0 01/27/2024 1802   GLUCOSEU NEGATIVE 01/27/2024 1802   HGBUR NEGATIVE 01/27/2024 1802   BILIRUBINUR NEGATIVE 01/27/2024 1802   KETONESUR NEGATIVE 01/27/2024 1802   PROTEINUR NEGATIVE 01/27/2024 1802   UROBILINOGEN 0.2 08/14/2014 1115   NITRITE NEGATIVE 01/27/2024 1802   LEUKOCYTESUR LARGE (A) 01/27/2024 1802    Radiological Exams on Admission: I have personally reviewed images CT Chest W Contrast Result Date: 01/28/2024 EXAM: CT CHEST WITH CONTRAST 01/28/2024 01:01:20 AM TECHNIQUE: CT of the chest was performed with the administration of intravenous contrast. Multiplanar reformatted images are provided for review. Automated exposure control, iterative reconstruction, and/or weight based adjustment of the mA/kV was utilized to reduce the radiation dose to as low as reasonably achievable. COMPARISON: Same day chest radiograph and CT from 11/19/2022. CLINICAL HISTORY: Respiratory illness, nondiagnostic xray. FINDINGS: MEDIASTINUM: Cardiomegaly. No pericardial effusion. Coronary artery and aortic atherosclerotic calcifications. Aortic valve replacement. Sternotomy. Ascending aortic aneurysm measuring 42 mm. Fluid column in the esophagus compatible with reflux. LYMPH NODES: No mediastinal, hilar or axillary lymphadenopathy. LUNGS AND PLEURA: Layering debris in the trachea extending into and occluding the left mainstem bronchus. Debris fills the left lower  lobe bronchi where there is atelectasis and poorly enhancing edema compatible with bronchopneumonia. Diffuse bronchial wall thickening. Scattered centrilobular nodules and tree-in-bud opacities greatest in the right middle lobe. No pleural effusion or pneumothorax. SOFT TISSUES/BONES: No acute abnormality of the bones or soft tissues. UPPER ABDOMEN: Limited images of the upper abdomen demonstrates no acute abnormality. IMPRESSION: 1. Bronchopneumonia in the left lower lobe. 2. Debris in the trachea extending into and occluding the left mainstem bronchus. 3. Chronic airway infection slash inflammation. 4. Ascending aortic aneurysm measuring 42 mm. Annual follow up CTA of the chest is recommended. Electronically signed by: Norman Gatlin MD 01/28/2024 01:20 AM EDT RP Workstation: [id number]   CT Maxillofacial W Contrast Result Date: 01/27/2024 CLINICAL DATA:  Provided history: Infection. Abscess. EXAM: CT MAXILLOFACIAL WITH CONTRAST TECHNIQUE: Multidetector CT imaging of the maxillofacial structures was performed with intravenous contrast. Multiplanar CT image reconstructions were also generated. RADIATION DOSE REDUCTION: This exam was performed according to the departmental dose-optimization program which includes automated exposure control, adjustment of the mA and/or kV according to patient size and/or use of iterative reconstruction technique. CONTRAST:  75mL OMNIPAQUE  IOHEXOL  350 MG/ML SOLN COMPARISON:  Brain MRI 01/16/2024. FINDINGS: Streak/beam hardening artifact arising from dental restoration partially obscures the oral cavity and perioral soft tissues. Within this limitation, findings are as follows. Osseous: No acute maxillofacial fracture. Periapical lucency surrounding the left mandibular second molar tooth consistent with periodontal disease. Cervical spondylosis and multilevel vertebral ankylosis at the visible levels. Orbits: No orbital mass or acute orbital finding. Sinuses: Minimal mucosal  thickening within the left maxillary sinus. Soft tissues: Asymmetric prominence and ill-defined enhancement of the left parotid gland with surrounding edema. Limited intracranial: No evidence of an acute intracranial abnormality within the field of view. Cerebral atrophy with infarcts in the supratentorial infratentorial brain, better assessed on the recent prior brain MRI of 07/19/2023. IMPRESSION: 1. Streak/beam hardening artifact arising from dental restoration partially obscures the oral cavity and perioral soft tissues. Within this limitation, findings are as follows. 2. Findings consistent with left parotitis. No evidence of a periparotid abscess. 3. Periapical lucency (consistent with periodontal disease) surrounding the left mandibular second molar tooth. No appreciable surrounding soft tissue inflammatory changes or soft tissue abscess. 4. Minor left maxillary sinus mucosal thickening. Electronically Signed   By: Rockey Childs D.O.   On: 01/27/2024 19:25   DG Chest Portable 1 View Result Date: 01/27/2024 CLINICAL DATA:  Cough/aspiration EXAM: PORTABLE CHEST 1 VIEW COMPARISON:  Chest radiograph January 16, 2024 FINDINGS: Cardiomediastinal silhouette is enlarged.  Aortic knob is prominent. Status post median sternotomy, aortic valve replacement and CABG. Increased interstitial markings of both lung fields and increased conspicuity of clusters of pulmonary nodules for example in right lower lobe. No significant pleural effusions. Status post right shoulder hemiarthroplasty. Flattening of left humeral head and severe arthropathy of left glenohumeral joint. Severe dextroconvex scoliosis and degenerative changes of the spine. IMPRESSION: Clusters of pulmonary nodules in right lung base and right middle lobe more conspicuous to prior. Correlate with chest CT findings in this patient with known malignancy. Enlarged cardiomediastinal silhouette, stable to prior. Electronically Signed   By: Megan  Zare M.D.   On:  01/27/2024 18:48     EKG: My personal interpretation of EKG shows:  Atrial fibrillation with ventricular premature complex heart rate 66.   Assessment/Plan: Principal Problem:   Sepsis due to pneumonia [facility]) Active Problems:   Parotitis, acute   HTN (hypertension)   Hyperlipidemia   Venous insufficiency   Chronic combined systolic and diastolic CHF (congestive heart failure) (HCC)   Chronic kidney disease (CKD), stage III (moderate) (HCC)   S/P AVR   Acute hypoxic respiratory failure (HCC)   Dysphagia and weight loss for 2 months   Failure to thrive in adult   History of CAD (coronary artery disease)   Paroxysmal atrial fibrillation (HCC)   Prostate cancer (HCC)   Aortic aneurysm (HCC)    Assessment and Plan: Community-acquired pneumonia Acute hypoxic respiratory failure in the setting of pneumonia -Presented emergency department multiple complaints include unable to swallow for last 2 months progressive loss of weight 40 pounds during this time.,  Generalized weakness, sore throat, productive cough.  Also at home patient O2 sat dropped to  80 to 84% per patient's wife.  At presentation to ED patient O2 sat 91% and maintaining 97 on 3 L oxygen . -Patient is tachypneic, febrile and borderline hypotensive.  Lactic acid level.  Clinical symptoms representing pneumonia.  Patient meets sepsis criteria. - Chest x-ray concerning for lung malignancy pending CT chest for confirmation. - CT maxillofacial showed evidence of left parotitis no evidence of abscess. -In the ED patient has been treated with IV ceftriaxone  azithromycin .  Received 1 L of NS bolus. - Given patient is immunocompromised in the setting of advanced prostate cancer we will broadening antibiotic with IV vancomycin  and continue ceftriaxone . - Pending respiratory panel, blood culture, sputum culture, urine Legionella, urine strep antigen.  Pending procalcitonin level. Continue supplemental oxygen  and supportive care. -In  setting of dysphagia keeping patient completely n.p.o. as high risk of aspiration. -Continue maintenance fluid D5-LR 75 cc/h. -Continue cardiac monitoring. Addendum - CT chest showed bronchopneumonia of the left lower lobe.Debris in the trachea extending into and occluding the left mainstem bronchus. Chronic airway infection slash inflammation. Ascending aortic aneurysm measuring 42 mm. Annual follow up CTA of the chest is recommended.   Acute parotitis/mumps Physical exam revealed left-sided parotid gland swelling with tenderness on palpation.  No evidence of facial cellulitis.   -Patient's wife at the bedside reported that patient never had MMR vaccine in childhood. -Continue supportive care.  And IV hydration.   Failure to thrive in adult Failure to thrive in the setting of advanced prostate cancer and multiple comorbidities. -Consulting inpatient PT, OT for possible SNF placement.   Dysphagia -Patient's wife reported that patient has dysphagia for almost 2 pounds that causing around 40 pounds weight loss however for last 4 days patient is more weaker as compared to his baseline with significant decline of functionality. - Per chart review patient has been scheduled for Digi swallow study on 8/4. - Patient failed swallow screen of water in the ED and high risk for aspiration. - Consult to speech therapy for barium swallow study while patient in the hospitalized. - Continue maintenance fluid. -All oral medication was on hold given high risk of aspiration in the setting of dysphagia.   Aortic aneurysm - CTA chest showed ascending aortic aneurysm 4.2 cm patient needs annual CTA chest follow-up.   Paroxysmal atrial fibrillation -Continue IV heparin  drip as patient unable to swallow Eliquis .  Continue cardiac monitoring.  Continue IV Lopressor  as needed for persistently.  Heart rate upper 120s range.   HFrEF 30 to 35% History of chronic hypotension. -At home patient is not on any  blood pressure regimen. -Unable to keep oral midodrine  open patient is NPO.  History of CAD status post CABG -Continue IV heparin  drip.  History of aortic valve stenosis  Advanced prostate cancer -As patient failed swallow screen holding oral Avodart .  Chronic urinary retention and BPH -Continue bladder scan and In-N-Out cath every 6 hours as needed.   CKD 3B - Creatinine 1.59 and GFR 40.  Renal function at baseline.  Continue to monitor renal function and avoid nephrotoxic agents.   DVT prophylaxis:  IV heparin  gtts Code Status:  Full Code Diet: N.p.o. Family Communication:   Family was present at bedside, at the time of interview. Opportunity was given to ask question and all questions were answered satisfactorily.  Disposition Plan: Pending CT chest, culture results, and barium swallow study. Consults: Speech therapist/pathologist Admission status:   Inpatient, Telemetry bed  Severity of Illness: The appropriate patient status for this patient is INPATIENT. Inpatient  status is judged to be reasonable and necessary in order to provide the required intensity of service to ensure the patient's safety. The patient's presenting symptoms, physical exam findings, and initial radiographic and laboratory data in the context of their chronic comorbidities is felt to place them at high risk for further clinical deterioration. Furthermore, it is not anticipated that the patient will be medically stable for discharge from the hospital within 2 midnights of admission.   * I certify that at the point of admission it is my clinical judgment that the patient will require inpatient hospital care spanning beyond 2 midnights from the point of admission due to high intensity of service, high risk for further deterioration and high frequency of surveillance required.[name] [name]    Shelonda Saxe, MD [facility]  How to contact the [facility] Attending or Consulting provider 7A - 7P or covering provider  during after hours 7P -7A, for this patient.  Check the care team in [facility] and look for a) attending/consulting [facility] provider listed and b) the [facility] team listed Log into [email or web address] and use [place]'s universal password to access. If you do not have the password, please contact the hospital operator. Locate the [facility] provider you are looking for under [facility] and page to a number that you can be directly reached. If you still have difficulty reaching the provider, please page the [facility] (Director on Call) for the Hospitalists listed on amion for assistance.  01/28/2024, 2:24 AM

## 2024-01-27 NOTE — Progress Notes (Signed)
 ANTICOAGULATION CONSULT NOTE  Pharmacy Consult for Heparin  Indication: atrial fibrillation  Allergies  Allergen Reactions   Simvastatin Other (See Comments)    caused neuropathy pain    Patient Measurements: Height: 5' 9 (175.3 cm) Weight: 63 kg (138 lb 12.8 oz) IBW/kg (Calculated) : 70.7 Heparin  Dosing Weight: 63 kg  Vital Signs: Temp: 100.9 F (38.3 C) (08/01 1747) Temp Source: Rectal (08/01 1747) BP: 122/69 (08/01 2100) Pulse Rate: 63 (08/01 2100)  Labs: Recent Labs    01/27/24 1720  HGB 14.4  HCT 43.7  PLT 184  CREATININE 1.59*    Estimated Creatinine Clearance: 25.9 mL/min (A) (by C-G formula based on SCr of 1.59 mg/dL (H)).   Medical History: Past Medical History:  Diagnosis Date   Aortic stenosis    Arthritis    CAD (coronary artery disease)    CHF (congestive heart failure) (HCC)    Colitis    Edema    Essential hypertension    no med now   Essential tremor    Hyperlipidemia    Hypotonic bladder    L4-L5   Obstructive uropathy    Peripheral neuropathy    Elevated by Dr. love 2010   Prostate cancer [facility]) 2005    Medications:  (Not in a hospital admission)  Scheduled:   sodium chloride  flush  3 mL Intravenous Q12H   sodium chloride  flush  3 mL Intravenous Q12H   Infusions:   sodium chloride      azithromycin  (ZITHROMAX ) 500 mg in sodium chloride  0.9 % 250 mL IVPB 500 mg (01/27/24 2058)   [START ON 01/28/2024] cefTRIAXone  (ROCEPHIN )  IV     dextrose  5% lactated ringers      vancomycin      Followed by   NOREEN ON 01/29/2024] vancomycin      PRN: sodium chloride , acetaminophen  **OR** acetaminophen , ipratropium-albuterol, metoprolol  tartrate, ondansetron  **OR** ondansetron  (ZOFRAN ) IV, phenol, sodium chloride  flush  Assessment: Patient with a history of HF, aortic stenosis s/p TAVR, CAD s/p CABG, AF on eliquis , CKD, chronic venous insufficiency, HTN, chronic urinary retention, HLD, peripheral neuropathy, prostate cancer, tremor. Patient  presenting with cough, unable to get out of bed, hypoxia, and painful left mandibular swelling concern for abscess. Heparin  per pharmacy consult placed for atrial fibrillation while unable to take PO eliquis .  Patient is on apixaban  prior to arrival. Last dose 7/30 per med rec. Will require aPTT monitoring due to likely falsely high anti-Xa level secondary to DOAC use.  Hgb 14.4; plt 184  Goal of Therapy:  Heparin  level 0.3-0.7 units/ml aPTT 66-102 seconds Monitor platelets by anticoagulation protocol: Yes   Plan:  No initial heparin  bolus Start heparin  infusion at 900 units/hr Check aPTT & anti-Xa level in 8 hours and daily while on heparin  Continue to monitor via aPTT until levels are correlated Continue to monitor H&H and platelets  Dorn Buttner, PharmD, BCPS 01/27/2024 9:37 PM ED Clinical Pharmacist -  [phone]

## 2024-01-27 NOTE — ED Provider Notes (Signed)
 [place] EMERGENCY DEPARTMENT AT [facility] Provider Note   CSN: [id number] Arrival date & time: 01/27/24  1708     Patient presents with: Abscess   Johnie Makki is a 89 y.o. male.   Patient to ED by EMS, called by wife for deterioration over the last 4 days with cough, immobilization (usually walks with cane or walker, can't get out of bed x 4 days), hypoxia this morning 80-84%, not on home oxygen , and today with a painful left mandibular swelling concern for abscess. Wife reports he is being evaluated for dysphagia, scheduled swallow study for Monday 8/4, which has interfered with oral nutrition for the last 2 months causing a 40 pound weight loss. No vomiting. No known fever at home.   The history is provided by the patient. No language interpreter was used.  Abscess      Prior to Admission medications   Medication Sig Start Date End Date Taking? Authorizing Provider  apixaban  (ELIQUIS ) 2.5 MG TABS tablet Take 1 tablet (2.5 mg total) by mouth 2 (two) times daily. 08/04/23   Bensimhon, Toribio SAUNDERS, MD  apixaban  (ELIQUIS ) 5 MG TABS tablet Take 1 tablet by mouth twice daily 10/03/23   [place], Peter M, MD  ascorbic acid (VITAMIN C) 500 MG tablet Take 500 mg by mouth daily.    [provider]  aspirin  81 MG tablet Take 81 mg by mouth at bedtime.     [provider]  B Complex Vitamins (B COMPLEX 100 PO) Take 1 tablet by mouth daily.    [provider]  Biotin 5000 MCG TABS Take 5,000 mcg by mouth daily.    [provider]  Calcium  Carb-Cholecalciferol (CALCIUM  + VITAMIN D3 PO) Take 1 tablet by mouth daily.    [provider]  cholecalciferol (VITAMIN D3) 25 MCG (1000 UNIT) tablet Take 1,000 Units by mouth daily.    [provider]  Coenzyme Q10 (CO Q-10) 100 MG CAPS Take 100 mg by mouth daily.    [provider]  docusate sodium  (COLACE) 100 MG capsule Take 100 mg by mouth daily.    [provider]   dutasteride  (AVODART ) 0.5 MG capsule Take 0.5 mg by mouth daily.    [provider]  feeding supplement, ENSURE COMPLETE, (ENSURE COMPLETE) LIQD Take 237 mLs by mouth daily.    [provider]  ferrous sulfate 325 (65 FE) MG tablet Take 325 mg by mouth daily.    [provider]  furosemide  (LASIX ) 40 MG tablet Take 1 tablet by mouth twice daily Patient taking differently: Take 40 mg by mouth daily. 03/01/23   [place], Peter M, MD  magnesium  oxide (MAG-OX) 400 (240 Mg) MG tablet Take 400 mg by mouth daily.    [provider]  Methylcellulose, Laxative, (FIBER THERAPY PO) Take 2 capsules by mouth daily.    [provider]  midodrine  (PROAMATINE ) 2.5 MG tablet TAKE 1 TABLET BY MOUTH TWICE DAILY WITH A MEAL 10/11/23   Bensimhon, Toribio SAUNDERS, MD  mirtazapine  (REMERON ) 7.5 MG tablet Take 7.5 mg by mouth at bedtime. 09/30/22   [provider]  Misc Natural Products (JOINT HEALTH PO) Take 1 tablet by mouth daily.    [provider]  Misc Natural Products (PROSTATE COMPLETE PO) Take 1 capsule by mouth daily.    [provider]  Multiple Vitamins-Minerals (CENTRUM SILVER PO) Take 1 tablet by mouth daily.     [provider]  Multiple Vitamins-Minerals (LUTEIN-ZEAXANTHIN PO) Take  1 tablet by mouth daily.    [provider]  ORGOVYX 120 MG tablet Take 120 mg by mouth daily.    [provider]  OVER THE COUNTER MEDICATION Take 1 capsule by mouth daily. Neuriva    [provider]  Potassium 99 MG TABS Take 99 mg by mouth daily.    [provider]  Saw Palmetto  450 MG CAPS Take 450 mg by mouth daily.    [provider]  sennosides-docusate sodium  (SENOKOT-S) 8.6-50 MG tablet Take 2 tablets by mouth daily.    [provider]  sulfaSALAzine  (AZULFIDINE ) 500 MG tablet Take 2,000 mg by mouth daily. 08/15/20   [provider]  tamsulosin  (FLOMAX ) 0.4 MG CAPS Take 0.4 mg by mouth 2  (two) times daily.     [provider]  TURMERIC CURCUMIN PO Take 1,500 mg by mouth daily.    [provider]    Allergies: Simvastatin    Review of Systems  Updated Vital Signs BP 136/77   Pulse 68   Temp (S) (!) 100.9 F (38.3 C) (Rectal)   Resp (!) 24   SpO2 94%   Physical Exam Constitutional:      Appearance: He is well-developed.     Comments: Frail appearing 89 yo patient  HENT:     Head: Normocephalic.     Comments: Large painful swelling at the left mandibular angle extending to base of ear and into the left side neck.     Mouth/Throat:     Mouth: Mucous membranes are dry.  Cardiovascular:     Rate and Rhythm: Normal rate and regular rhythm.  Pulmonary:     Effort: Pulmonary effort is normal.     Breath sounds: Rales (L>R) present. No wheezing or rhonchi.     Comments: Actively coughing Abdominal:     General: There is no distension.     Palpations: Abdomen is soft.     Tenderness: There is no abdominal tenderness. There is no guarding or rebound.  Musculoskeletal:        General: Normal range of motion.     Cervical back: Normal range of motion and neck supple.     Right lower leg: No edema.     Left lower leg: No edema.  Skin:    General: Skin is warm and dry.  Neurological:     General: No focal deficit present.     Mental Status: He is alert and oriented to person, place, and time.     (all labs ordered are listed, but only abnormal results are displayed) Labs Reviewed  CBC WITH DIFFERENTIAL/PLATELET - Abnormal; Notable for the following components:      Result Value   WBC 14.7 (*)    Neutro Abs 12.9 (*)    Lymphs Abs 0.5 (*)    All other components within normal limits  COMPREHENSIVE METABOLIC PANEL WITH GFR - Abnormal; Notable for the following components:   Glucose, Bld 180 (*)    BUN 43 (*)    Creatinine, Ser 1.59 (*)    Albumin  3.2 (*)    Total Bilirubin 2.2 (*)    GFR, Estimated 40 (*)    All other components within  normal limits  URINALYSIS, W/ REFLEX TO CULTURE (INFECTION SUSPECTED) - Abnormal; Notable for the following components:   Color, Urine AMBER (*)    APPearance HAZY (*)    Leukocytes,Ua LARGE (*)    Bacteria, UA MANY (*)    All other components  within normal limits  CULTURE, BLOOD (ROUTINE X 2)  CULTURE, BLOOD (ROUTINE X 2)  LACTIC ACID, PLASMA  LACTIC ACID, PLASMA  CK    EKG: EKG Interpretation Date/Time:  Friday January 27 2024 17:12:11 EDT Ventricular Rate:  66 PR Interval:    QRS Duration:  163 QT Interval:  480 QTC Calculation: 503 R Axis:   -66  Text Interpretation: Atrial fibrillation Ventricular premature complex IVCD, consider atypical RBBB Anterolateral infarct, age indeterminate Similar to prior Confirmed by Franklyn Gills [phone]) on 01/27/2024 8:24:16 PM  Radiology: CT Maxillofacial W Contrast Result Date: 01/27/2024 CLINICAL DATA:  Provided history: Infection. Abscess. EXAM: CT MAXILLOFACIAL WITH CONTRAST TECHNIQUE: Multidetector CT imaging of the maxillofacial structures was performed with intravenous contrast. Multiplanar CT image reconstructions were also generated. RADIATION DOSE REDUCTION: This exam was performed according to the departmental dose-optimization program which includes automated exposure control, adjustment of the mA and/or kV according to patient size and/or use of iterative reconstruction technique. CONTRAST:  75mL OMNIPAQUE  IOHEXOL  350 MG/ML SOLN COMPARISON:  Brain MRI 01/16/2024. FINDINGS: Streak/beam hardening artifact arising from dental restoration partially obscures the oral cavity and perioral soft tissues. Within this limitation, findings are as follows. Osseous: No acute maxillofacial fracture. Periapical lucency surrounding the left mandibular second molar tooth consistent with periodontal disease. Cervical spondylosis and multilevel vertebral ankylosis at the visible levels. Orbits: No orbital mass or acute orbital finding. Sinuses: Minimal mucosal  thickening within the left maxillary sinus. Soft tissues: Asymmetric prominence and ill-defined enhancement of the left parotid gland with surrounding edema. Limited intracranial: No evidence of an acute intracranial abnormality within the field of view. Cerebral atrophy with infarcts in the supratentorial infratentorial brain, better assessed on the recent prior brain MRI of 07/19/2023. IMPRESSION: 1. Streak/beam hardening artifact arising from dental restoration partially obscures the oral cavity and perioral soft tissues. Within this limitation, findings are as follows. 2. Findings consistent with left parotitis. No evidence of a periparotid abscess. 3. Periapical lucency (consistent with periodontal disease) surrounding the left mandibular second molar tooth. No appreciable surrounding soft tissue inflammatory changes or soft tissue abscess. 4. Minor left maxillary sinus mucosal thickening. Electronically Signed   By: Rockey Childs D.O.   On: 01/27/2024 19:25   DG Chest Portable 1 View Result Date: 01/27/2024 CLINICAL DATA:  Cough/aspiration EXAM: PORTABLE CHEST 1 VIEW COMPARISON:  Chest radiograph January 16, 2024 FINDINGS: Cardiomediastinal silhouette is enlarged.  Aortic knob is prominent. Status post median sternotomy, aortic valve replacement and CABG. Increased interstitial markings of both lung fields and increased conspicuity of clusters of pulmonary nodules for example in right lower lobe. No significant pleural effusions. Status post right shoulder hemiarthroplasty. Flattening of left humeral head and severe arthropathy of left glenohumeral joint. Severe dextroconvex scoliosis and degenerative changes of the spine. IMPRESSION: Clusters of pulmonary nodules in right lung base and right middle lobe more conspicuous to prior. Correlate with chest CT findings in this patient with known malignancy. Enlarged cardiomediastinal silhouette, stable to prior. Electronically Signed   By: Megan  Zare M.D.   On:  01/27/2024 18:48     Procedures   Medications Ordered in the ED  azithromycin  (ZITHROMAX ) 500 mg in sodium chloride  0.9 % 250 mL IVPB (500 mg Intravenous New Bag/Given 01/27/24 2058)  cefTRIAXone  (ROCEPHIN ) 2 g in sodium chloride  0.9 % 100 mL IVPB (has no administration in time range)  iohexol  (OMNIPAQUE ) 350 MG/ML injection 75 mL (75 mLs Intravenous Contrast Given 01/27/24 1903)  cefTRIAXone  (ROCEPHIN ) 1 g in sodium chloride   0.9 % 100 mL IVPB (0 g Intravenous Stopped 01/27/24 2053)  sodium chloride  0.9 % bolus 1,000 mL (1,000 mLs Intravenous New Bag/Given 01/27/24 2023)                                    Medical Decision Making This patient presents to the ED for concern of hypoxia, this involves an extensive number of treatment options, and is a complaint that carries with it a high risk of complications and morbidity.  The differential diagnosis includes PNA, PTX, PE, ACS   Co morbidities that complicate the patient evaluation  HLD, self-caths for urine, CAD, CHF, HTN   Additional history obtained:  Additional history and/or information obtained from chart review, notable for Wife at bedside   Lab Tests:  I Ordered, and personally interpreted labs.  The pertinent results include:   Results for orders placed or performed during the hospital encounter of 01/27/24 -CBC with Differential:  Collection Time: 01/27/24  5:20 PM      Result                      Value             Ref Range          WBC                         14.7 (H)          4.0 - 10.5 K*      RBC                         4.63              4.22 - 5.81 *      Hemoglobin                  14.4              13.0 - 17.0 *      HCT                         43.7              39.0 - 52.0 %      MCV                         94.4              80.0 - 100.0*      MCH                         31.1              26.0 - 34.0 *      MCHC                        33.0              30.0 - 36.0 *      RDW                         13.9               11.5 - 15.5 %  Platelets                   184               150 - 400 K/*      nRBC                        0.0               0.0 - 0.2 %        Neutrophils Relative %      88                %                  Neutro Abs                  12.9 (H)          1.7 - 7.7 K/*      Lymphocytes Relative        3                 %                  Lymphs Abs                  0.5 (L)           0.7 - 4.0 K/*      Monocytes Relative          6                 %                  Monocytes Absolute          0.9               0.1 - 1.0 K/*      Eosinophils Relative        3                 %                  Eosinophils Absolute        0.4               0.0 - 0.5 K/*      Basophils Relative          0                 %                  Basophils Absolute          0.0               0.0 - 0.1 K/*      Immature Granulocytes       0                 %                  Abs Immature Granulocy*     0.06              0.00 - 0.07 * -Comprehensive metabolic panel:  Collection Time: 01/27/24  5:20 PM      Result  Value             Ref Range          Sodium                      142               135 - 145 mm*      Potassium                   3.8               3.5 - 5.1 mm*      Chloride                    102               98 - 111 mmo*      CO2                         26                22 - 32 mmol*      Glucose, Bld                180 (H)           70 - 99 mg/dL      BUN                         43 (H)            8 - 23 mg/dL       Creatinine, Ser             1.59 (H)          0.61 - 1.24 *      Calcium                      9.1               8.9 - 10.3 m*      Total Protein               7.5               6.5 - 8.1 g/*      Albumin                      3.2 (L)           3.5 - 5.0 g/*      AST                         28                15 - 41 U/L        ALT                         15                0 - 44 U/L         Alkaline Phosphatase        74                38 - 126 U/L       Total  Bilirubin  2.2 (H)           0.0 - 1.2 mg*      GFR, Estimated              40 (L)            >60 mL/min         Anion gap                   14                5 - 15            Imaging Studies ordered:  I ordered imaging studies including CXR Per radiologist interpretation:  IMPRESSION: Clusters of pulmonary nodules in right lung base and right middle lobe more conspicuous to prior. Correlate with chest CT findings in this patient with known malignancy.   Enlarged cardiomediastinal silhouette, stable to prior.  CT maxillo w/o CM:  IMPRESSION: 1. Streak/beam hardening artifact arising from dental restoration partially obscures the oral cavity and perioral soft tissues. Within this limitation, findings are as follows. 2. Findings consistent with left parotitis. No evidence of a periparotid abscess. 3. Periapical lucency (consistent with periodontal disease) surrounding the left mandibular second molar tooth. No appreciable surrounding soft tissue inflammatory changes or soft tissue abscess. 4. Minor left maxillary sinus mucosal thickening.     Cardiac Monitoring:  The patient was maintained on a cardiac monitor.  I personally viewed and interpreted the cardiac monitored which showed an underlying rhythm of:  EKG Interpretation Date/Time:  Friday January 27 2024 17:12:11 EDT Ventricular Rate:  66 PR Interval:    QRS Duration:  163 QT Interval:  480 QTC Calculation: 503 R Axis:   -66  Text Interpretation: Atrial fibrillation Ventricular premature complex IVCD, consider atypical RBBB Anterolateral infarct, age indeterminate Similar to prior Confirmed by Franklyn Gills [phone]) on 01/27/2024 8:24:16 PM   Test Considered:  CT maxillo to eval ?abscess vs parotitis on left CT chest - neg CXR for PNA but feel PNA clinically present.   Critical Interventions:  Oxygen  at 2 Liters IV fluids Antibiotics   Consultations Obtained:  I requested consultation with  the n/a,  and discussed lab and imaging findings as well as pertinent plan - they recommend: n/a   Problem List / ED Course:  Here with hypoxia at home and productive cough. Cough worsening and progressive in the setting of dysphagia. Due for swallow study on Monday (8/4) Found to have a low grade fever in the ED, active, very wet cough. Failed swallow screen with water in ED. CXR not showing PNA but feel this is clinically present. CT chest pending. IV antibiotics started for CAP/aspiration.  Mild leukocytosis, negative lactic acid.  He is also progressively declining with dramatic weight loss due to difficulty swallowing over the last 3-4 months. Had been able to ambulate with cane or walker, but now cannot ambulate at all due to weakness.   Patient to be admitted for further treatment and work-up.  Discussed with TRN who accepted the patient to their service.    Reevaluation:  After the interventions noted above, I reevaluated the patient and found that they have :stayed the same   Social Determinants of Health:  Never a smoker   Disposition:  After consideration of the diagnostic results and the patients response to treatment, I feel that the patient would benefit from admit.   Amount and/or Complexity of Data Reviewed Labs: ordered. Radiology: ordered.  Risk Prescription drug management. Decision regarding hospitalization.         Final diagnoses:  Hypoxia  Community acquired pneumonia, unspecified laterality  Parotitis  Immobility    ED Discharge Orders     None          Odell Margit RIGGERS 01/27/24 2120    Franklyn Sid SAILOR, MD 01/27/24 2312

## 2024-01-27 NOTE — Progress Notes (Signed)
 Pharmacy Antibiotic Note  [name] [name] is a 89 y.o. male for which pharmacy has been consulted for vancomycin  dosing for pneumonia.  Patient with a history of HF, aortic stenosis s/p TAVR, CAD s/p CABG, AF on eliquis , CKD, chronic venous insufficiency, HTN, chronic urinary retention, HLD, peripheral neuropathy, prostate cancer, tremor. Patient presenting with cough, unable to get out of bed, hypoxia, and painful left mandibular swelling concern for abscess.  SCr 1.59 WBC 14.7; LA 1.5; T 100.9; HR 63; RR 35  Plan: Azithromycin  given x 1 in ED Ceftriaxone  per MD Vancomycin  1250 mg once then 1000 mg q48hr (eAUC 426) unless change in renal function Monitor WBC, fever, renal function, cultures De-escalate when able Levels at steady state F/u MRSA PCR  Height: 5' 9 (175.3 cm) Weight: 63 kg (138 lb 12.8 oz) IBW/kg (Calculated) : 70.7  Temp (24hrs), Avg:99.6 F (37.6 C), Min:98.3 F (36.8 C), Max:100.9 F (38.3 C)  Recent Labs  Lab 01/27/24 1720 01/27/24 2006  WBC 14.7*  --   CREATININE 1.59*  --   LATICACIDVEN  --  1.5    Estimated Creatinine Clearance: 25.9 mL/min (A) (by C-G formula based on SCr of 1.59 mg/dL (H)).    Allergies  Allergen Reactions   Simvastatin Other (See Comments)    caused neuropathy pain   Microbiology results: Pending  Thank you for allowing pharmacy to be a part of this patient's care.  Dorn Buttner, PharmD, BCPS 01/27/2024 9:30 PM ED Clinical Pharmacist -  [phone]

## 2024-01-28 ENCOUNTER — Inpatient Hospital Stay (HOSPITAL_COMMUNITY)

## 2024-01-28 DIAGNOSIS — I719 Aortic aneurysm of unspecified site, without rupture: Secondary | ICD-10-CM | POA: Insufficient documentation

## 2024-01-28 DIAGNOSIS — B9561 Methicillin susceptible Staphylococcus aureus infection as the cause of diseases classified elsewhere: Secondary | ICD-10-CM | POA: Diagnosis present

## 2024-01-28 DIAGNOSIS — J189 Pneumonia, unspecified organism: Secondary | ICD-10-CM | POA: Diagnosis not present

## 2024-01-28 LAB — BLOOD CULTURE ID PANEL (REFLEXED) - BCID2

## 2024-01-28 LAB — RESPIRATORY PANEL BY PCR

## 2024-01-28 LAB — CBC
HCT: 33.9 % — ABNORMAL LOW (ref 39.0–52.0)
Hemoglobin: 10.4 g/dL — ABNORMAL LOW (ref 13.0–17.0)
MCH: 31.8 pg (ref 26.0–34.0)
MCHC: 30.7 g/dL (ref 30.0–36.0)
MCV: 103.7 fL — ABNORMAL HIGH (ref 80.0–100.0)
Platelets: 121 K/uL — ABNORMAL LOW (ref 150–400)
RBC: 3.27 MIL/uL — ABNORMAL LOW (ref 4.22–5.81)
RDW: 14.6 % (ref 11.5–15.5)
WBC: 11.2 K/uL — ABNORMAL HIGH (ref 4.0–10.5)
nRBC: 0 % (ref 0.0–0.2)

## 2024-01-28 LAB — APTT
aPTT: 108 s — ABNORMAL HIGH (ref 24–36)
aPTT: 68 s — ABNORMAL HIGH (ref 24–36)

## 2024-01-28 LAB — HEPARIN LEVEL (UNFRACTIONATED): Heparin Unfractionated: 0.47 [IU]/mL (ref 0.30–0.70)

## 2024-01-28 LAB — MRSA NEXT GEN BY PCR, NASAL: MRSA by PCR Next Gen: NOT DETECTED

## 2024-01-28 MED ORDER — DEXTROSE-SODIUM CHLORIDE 5-0.9 % IV SOLN: INTRAVENOUS | Status: DC

## 2024-01-28 MED ORDER — SODIUM CHLORIDE 0.9 % IV SOLN: 500.0000 mg | INTRAVENOUS | Status: DC

## 2024-01-28 MED ORDER — IOHEXOL 350 MG/ML SOLN
75.0000 mL | Freq: Once | INTRAVENOUS | Status: AC | PRN
  Administered 2024-01-28: 75 mL via INTRAVENOUS

## 2024-01-28 MED ORDER — CEFAZOLIN SODIUM-DEXTROSE 2-4 GM/100ML-% IV SOLN
2.0000 g | Freq: Two times a day (BID) | INTRAVENOUS | Status: DC
  Administered 2024-01-28: 2 g via INTRAVENOUS
  Filled 2024-01-28: qty 100

## 2024-01-28 MED ORDER — SODIUM CHLORIDE 0.9 % IV SOLN: 3.0000 g | Freq: Two times a day (BID) | INTRAVENOUS | Status: DC

## 2024-01-28 MED ORDER — METRONIDAZOLE 500 MG/100ML IV SOLN
500.0000 mg | Freq: Two times a day (BID) | INTRAVENOUS | Status: AC
  Administered 2024-01-28 (×2): 500 mg via INTRAVENOUS
  Filled 2024-01-28 (×2): qty 100

## 2024-01-28 NOTE — Progress Notes (Addendum)
 ANTICOAGULATION CONSULT NOTE  Pharmacy Consult for Heparin  Indication: atrial fibrillation  Allergies  Allergen Reactions   Simvastatin Other (See Comments)    caused neuropathy pain    Patient Measurements: Height: 5' 9 (175.3 cm) Weight: 59.9 kg (132 lb 0.9 oz) IBW/kg (Calculated) : 70.7 Heparin  Dosing Weight: 63 kg  Vital Signs: Temp: 98 F (36.7 C) (08/02 2028) Temp Source: Oral (08/02 2028) BP: 114/79 (08/02 2028) Pulse Rate: 73 (08/02 2028)  Labs: Recent Labs    01/27/24 1720 01/27/24 2212 01/28/24 1127 01/28/24 2139  HGB 14.4  --  10.4*  --   HCT 43.7  --  33.9*  --   PLT 184  --  121*  --   APTT  --  33 108* 68*  HEPARINUNFRC  --  0.50 0.47  --   CREATININE 1.59*  --   --   --   CKTOTAL  --  30*  --   --     Estimated Creatinine Clearance: 24.6 mL/min (A) (by C-G formula based on SCr of 1.59 mg/dL (H)).  Assessment: Patient with a history of HF, aortic stenosis s/p TAVR, CAD s/p CABG, AF on eliquis , CKD, chronic venous insufficiency, HTN, chronic urinary retention, HLD, peripheral neuropathy, prostate cancer, tremor. Patient presenting with cough, unable to get out of bed, hypoxia, and painful left mandibular swelling concern for abscess. Heparin  per pharmacy consult placed for atrial fibrillation while unable to take PO apixaban .  Patient is on apixaban  prior to arrival. Last dose 7/30 per med rec. Will require aPTT monitoring due to likely falsely high anti-Xa level secondary to DOAC use.  aPTT therapeutic at 68 seconds.  Goal of Therapy:  Heparin  level 0.3-0.7 units/ml aPTT 66-102 seconds Monitor platelets by anticoagulation protocol: Yes   Plan:  Continue heparin  infusion 800 units/hr Daily heparin  level, aPTT, CBC  Ozell Jamaica, PharmD, BCPS, [facility] Clinical Pharmacist [phone] Please check AMION for all [facility] numbers 01/28/2024

## 2024-01-28 NOTE — Progress Notes (Signed)
 ANTICOAGULATION CONSULT NOTE  Pharmacy Consult for Heparin  Indication: atrial fibrillation  Allergies  Allergen Reactions   Simvastatin Other (See Comments)    caused neuropathy pain    Patient Measurements: Height: 5' 9 (175.3 cm) Weight: 59.9 kg (132 lb 0.9 oz) IBW/kg (Calculated) : 70.7 Heparin  Dosing Weight: 63 kg  Vital Signs: Temp: 98.7 F (37.1 C) (08/02 0813) Temp Source: Oral (08/02 0813) BP: 118/73 (08/02 0424) Pulse Rate: 69 (08/02 0424)  Labs: Recent Labs    01/27/24 1720 01/27/24 2212 01/28/24 1127  HGB 14.4  --  10.4*  HCT 43.7  --  33.9*  PLT 184  --  121*  APTT  --  33 108*  HEPARINUNFRC  --  0.50 0.47  CREATININE 1.59*  --   --   CKTOTAL  --  30*  --     Estimated Creatinine Clearance: 24.6 mL/min (A) (by C-G formula based on SCr of 1.59 mg/dL (H)).  Assessment: Patient with a history of HF, aortic stenosis s/p TAVR, CAD s/p CABG, AF on eliquis , CKD, chronic venous insufficiency, HTN, chronic urinary retention, HLD, peripheral neuropathy, prostate cancer, tremor. Patient presenting with cough, unable to get out of bed, hypoxia, and painful left mandibular swelling concern for abscess. Heparin  per pharmacy consult placed for atrial fibrillation while unable to take PO apixaban .  Patient is on apixaban  prior to arrival. Last dose 7/30 per med rec. Will require aPTT monitoring due to likely falsely high anti-Xa level secondary to DOAC use.  aPTT is above goal at 108 sec. No overt bleeding noted, Hgb down to 10.4, platelets down to 121. Spoke with RN and no overt bleeding noted - mentioned MD plans to recheck CBC.   Goal of Therapy:  Heparin  level 0.3-0.7 units/ml aPTT 66-102 seconds Monitor platelets by anticoagulation protocol: Yes   Plan:  Decrease heparin  infusion to 800 units/hr 8h aPTT Daily heparin  level, aPTT, CBC Monitor for s/sx of bleeding  Thank you for involving pharmacy in this patient's care.  Delon Sax, PharmD,  BCPS Clinical Pharmacist Clinical phone for 01/28/2024 is [phone] 01/28/2024 12:58 PM

## 2024-01-28 NOTE — Progress Notes (Signed)
 PROGRESS NOTE  [name] [name]  FMW:[id number] DOB: 1928/03/28 DOA: 01/27/2024 PCP: Aisha Harvey, MD  Consultants  Brief Narrative: 89 y.o. 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 to 35%, aortic stenosis s/p TAVR, CAD status post CABG, atrial fibrillation and flutter on Eliquis , CKD stage3a, chronic venous insufficiency of bilateral lower extremities, essential hypertension, chronic urinary retention-self cath as needed, hyperlipidemia, peripheral neuropathy, advanced prostate cancer, arthritis, and essential tremor presented to emergency department complaining of clinical deterioration over the course of last 4 days with associated cough, immobilization (patient usually walks with cane and walker but cannot get out of the bed due to generalized weakness), and patient also found hypoxia 80 to 84% at home not using any home oxygen  at baseline also painful left mandibular swelling concerning for abscess.   Wife reported that patient has some dysphagia and he has been scheduled for swallow study on 8/4 which has been interfering patient's nutrition for last 2 months causing 40 pound weight loss. Patient denies any fever, chill, nausea, vomiting, constipation or diarrhea.  Admitted for pneumonia and acute hypoxic respiratory failure as well as failure to thrive in adult.   Assessment & Plan: ommunity-acquired pneumonia Acute hypoxic respiratory failure in the setting of pneumonia - On oxygen  today.  Breathing is much improved. - Currently on ceftriaxone /Flagyl .  Switching to Unasyn to cover for presumed aspiration pneumonia as well as parotitis, see below.   Acute parotitis, unilateral Physical exam revealed left-sided parotid gland swelling with tenderness on palpation.   - Also with notable tenderness/redness -On ceftriaxone /Flagyl  and switching to Unasyn for treatment.    Failure to thrive in adult Failure to thrive in the setting of advanced prostate cancer and  multiple comorbidities. -Consulting inpatient PT, OT for possible SNF placement. - Also consulted palliative care in light of speech pathology findings documented below.    Dysphagia -Patient's wife reported that patient has dysphagia for almost 2 months that causing around 40 pounds weight loss --worsen for the past several days.   - Patient with MBSS here conducted by speech pathology. -Found to have osteophytes at level of UES impinging on esophagus and preventing UES opening. -Patient deemed to be not safe for an oral diet at this time. -Placing palliative care discussion for goals of care now.  Also for now we are keeping him on D5/normal saline for nutrition but will need to make a switch soon, pending palliative care input.  Aortic aneurysm - CTA chest showed ascending aortic aneurysm 4.2 cm, would need annual CTA chest follow-up.   Paroxysmal atrial fibrillation -Continue IV heparin  drip as patient unable to swallow Eliquis .  Continue cardiac monitoring.  Continue IV Lopressor  as needed for persistently.  Heart rate upper 120s range. - Await palliative care input.  Can switch to longer-acting lovenox  pending discussion, will continue heparin  for now.     HFrEF 30 to 35% History of chronic hypotension. -At home patient is not on any blood pressure regimen. -Unable to continue oral midodrine  as patient is NPO. -Blood pressure has been acceptable here in the 1 teens to 120s systolic.   History of CAD status post CABG -Continue IV heparin  drip.   History of aortic valve stenosis   Advanced prostate cancer -As patient failed swallow screen holding oral Avodart .   Chronic urinary retention and BPH -Continue bladder scan and In-N-Out cath every 6 hours as needed.    CKD 3B - Creatinine downtrending from admission.  Will follow -  renal function  at baseline.  Continue to monitor renal function and avoid nephrotoxic agents.     DVT prophylaxis:  SCDs Start: 01/27/24  2122 Place TED hose Start: 01/27/24 2122 Heparin  Code Status:   Code Status: Full Code Family Communication: Attempted to call wife but had to leave message Level of care: Telemetry Cardiac Status is: Inpatient   Consults called: Palliative care consult  Subjective: Patient complains of dry mouth and pain left mandibular angle where it is red and swollen.  Reports being thirsty.  Otherwise denies any shortness of breath/dyspnea at rest.  No nausea or vomiting.  No chest pain  Objective: Vitals:   01/28/24 0120 01/28/24 0130 01/28/24 0424 01/28/24 0813  BP: 120/74  118/73   Pulse: 69 77 69   Resp:   17 18  Temp:   99.2 F (37.3 C) 98.7 F (37.1 C)  TempSrc:   Oral Oral  SpO2: 92% 93% 95%   Weight:  59.9 kg    Height:  5' 9 (1.753 m)      Intake/Output Summary (Last 24 hours) at 01/28/2024 1528 Last data filed at 01/28/2024 1200 Gross per 24 hour  Intake 348.37 ml  Output 1269 ml  Net -920.63 ml   Filed Weights   01/27/24 2129 01/28/24 0130  Weight: 63 kg 59.9 kg   Body mass index is 19.5 kg/m.  Gen: 89 y.o. male in no apparent distress.  Chronically ill-appearing male HEENT: Notable swelling angle of mandible left side of face with overlying redness/warmth consistent with cellulitis/parotitis Pulm: Non-labored breathing.  Coarse breath sounds anterior chest CV: Regular rate and rhythm.  GI: Abdomen soft, non-tender, non-distended, with normoactive bowel sounds. No organomegaly or masses felt. Ext: Warm, no deformities, trace bilateral pedal edema Skin: No rashes, lesions  Neuro: Alert and oriented. No focal neurological deficits. Psych: Calm  Judgement and insight appear normal. Mood & affect appropriate.     I have personally reviewed the following labs and images: CBC: Recent Labs  Lab 01/27/24 1720 01/28/24 1127  WBC 14.7* 11.2*  NEUTROABS 12.9*  --   HGB 14.4 10.4*  HCT 43.7 33.9*  MCV 94.4 103.7*  PLT 184 121*   BMP &GFR Recent Labs  Lab  01/27/24 1720  NA 142  K 3.8  CL 102  CO2 26  GLUCOSE 180*  BUN 43*  CREATININE 1.59*  CALCIUM  9.1   Estimated Creatinine Clearance: 24.6 mL/min (A) (by C-G formula based on SCr of 1.59 mg/dL (H)). Liver & Pancreas: Recent Labs  Lab 01/27/24 1720  AST 28  ALT 15  ALKPHOS 74  BILITOT 2.2*  PROT 7.5  ALBUMIN  3.2*   No results for input(s): LIPASE, AMYLASE in the last 168 hours. No results for input(s): AMMONIA in the last 168 hours. Diabetic: No results for input(s): HGBA1C in the last 72 hours. No results for input(s): GLUCAP in the last 168 hours. Cardiac Enzymes: Recent Labs  Lab 01/27/24 2212  CKTOTAL 30*   No results for input(s): PROBNP in the last 8760 hours. Coagulation Profile: No results for input(s): INR, PROTIME in the last 168 hours. Thyroid  Function Tests: No results for input(s): TSH, T4TOTAL, FREET4, T3FREE, THYROIDAB in the last 72 hours. Lipid Profile: No results for input(s): CHOL, HDL, LDLCALC, TRIG, CHOLHDL, LDLDIRECT in the last 72 hours. Anemia Panel: No results for input(s): VITAMINB12, FOLATE, FERRITIN, TIBC, IRON, RETICCTPCT in the last 72 hours. Urine analysis:    Component Value Date/Time   COLORURINE AMBER (A) 01/27/2024 1802  APPEARANCEUR HAZY (A) 01/27/2024 1802   LABSPEC 1.015 01/27/2024 1802   PHURINE 5.0 01/27/2024 1802   GLUCOSEU NEGATIVE 01/27/2024 1802   HGBUR NEGATIVE 01/27/2024 1802   BILIRUBINUR NEGATIVE 01/27/2024 1802   KETONESUR NEGATIVE 01/27/2024 1802   PROTEINUR NEGATIVE 01/27/2024 1802   UROBILINOGEN 0.2 08/14/2014 1115   NITRITE NEGATIVE 01/27/2024 1802   LEUKOCYTESUR LARGE (A) 01/27/2024 1802   Sepsis Labs: Invalid input(s): PROCALCITONIN, LACTICIDVEN  Microbiology: Recent Results (from the past 240 hours)  Blood culture (routine x 2)     Status: None (Preliminary result)   Collection Time: 01/27/24  5:16 PM   Specimen: BLOOD LEFT ARM  Result Value  Ref Range Status   Specimen Description BLOOD LEFT ARM  Final   Special Requests   Final    BOTTLES DRAWN AEROBIC AND ANAEROBIC Blood Culture adequate volume   Culture  Setup Time   Final    GRAM POSITIVE COCCI IN CLUSTERS AEROBIC BOTTLE ONLY CRITICAL RESULT CALLED TO, READ BACK BY AND VERIFIED WITH: PHARMD MADELINE MITCHELL [id number] [id number] BY JINNY COMMON, [place] Performed at [facility], [address], [place], [place] [address]    Culture GRAM POSITIVE COCCI  Final   Report Status PENDING  Incomplete  Blood Culture ID Panel (Reflexed)     Status: Abnormal   Collection Time: 01/27/24  5:16 PM  Result Value Ref Range Status   Enterococcus faecalis NOT DETECTED NOT DETECTED Final   Enterococcus Faecium NOT DETECTED NOT DETECTED Final   Listeria monocytogenes NOT DETECTED NOT DETECTED Final   Staphylococcus species DETECTED (A) NOT DETECTED Final    Comment: CRITICAL RESULT CALLED TO, READ BACK BY AND VERIFIED WITH: PHARMD MADELINE MITCHELL [phone] 1407 BY J RAZZAK, MT    Staphylococcus aureus (BCID) DETECTED (A) NOT DETECTED Final    Comment: CRITICAL RESULT CALLED TO, READ BACK BY AND VERIFIED WITH: PHARMD MADELINE MITCHELL [phone] 1407 BY J RAZZAK, MT    Staphylococcus epidermidis NOT DETECTED NOT DETECTED Final   Staphylococcus lugdunensis NOT DETECTED NOT DETECTED Final   Streptococcus species NOT DETECTED NOT DETECTED Final   Streptococcus agalactiae NOT DETECTED NOT DETECTED Final   Streptococcus pneumoniae NOT DETECTED NOT DETECTED Final   Streptococcus pyogenes NOT DETECTED NOT DETECTED Final   A.calcoaceticus-baumannii NOT DETECTED NOT DETECTED Final   Bacteroides fragilis NOT DETECTED NOT DETECTED Final   Enterobacterales NOT DETECTED NOT DETECTED Final   Enterobacter cloacae complex NOT DETECTED NOT DETECTED Final   Escherichia coli NOT DETECTED NOT DETECTED Final   Klebsiella aerogenes NOT DETECTED NOT DETECTED Final   Klebsiella oxytoca NOT DETECTED NOT DETECTED  Final   Klebsiella pneumoniae NOT DETECTED NOT DETECTED Final   Proteus species NOT DETECTED NOT DETECTED Final   Salmonella species NOT DETECTED NOT DETECTED Final   Serratia marcescens NOT DETECTED NOT DETECTED Final   Haemophilus influenzae NOT DETECTED NOT DETECTED Final   Neisseria meningitidis NOT DETECTED NOT DETECTED Final   Pseudomonas aeruginosa NOT DETECTED NOT DETECTED Final   Stenotrophomonas maltophilia NOT DETECTED NOT DETECTED Final   Candida albicans NOT DETECTED NOT DETECTED Final   Candida auris NOT DETECTED NOT DETECTED Final   Candida glabrata NOT DETECTED NOT DETECTED Final   Candida krusei NOT DETECTED NOT DETECTED Final   Candida parapsilosis NOT DETECTED NOT DETECTED Final   Candida tropicalis NOT DETECTED NOT DETECTED Final   Cryptococcus neoformans/gattii NOT DETECTED NOT DETECTED Final   Meth resistant mecA/C and MREJ NOT DETECTED NOT DETECTED Final  Comment: Performed at [facility], [address] [place], [place] [address]  Blood culture (routine x 2)     Status: None (Preliminary result)   Collection Time: 01/27/24  8:06 PM   Specimen: BLOOD  Result Value Ref Range Status   Specimen Description BLOOD SITE NOT SPECIFIED  Final   Special Requests   Final    BOTTLES DRAWN AEROBIC ONLY Blood Culture adequate volume   Culture  Setup Time   Final    GRAM POSITIVE COCCI AEROBIC BOTTLE ONLY CRITICAL VALUE NOTED.  VALUE IS CONSISTENT WITH PREVIOUSLY REPORTED AND CALLED VALUE. Performed at [facility], [address] [place], [place] [address]    Culture GRAM POSITIVE COCCI  Final   Report Status PENDING  Incomplete  Culture, blood (routine x 2) Call MD if unable to obtain prior to antibiotics being given     Status: None (Preliminary result)   Collection Time: 01/27/24 10:12 PM   Specimen: BLOOD RIGHT ARM  Result Value Ref Range Status   Specimen Description BLOOD RIGHT ARM  Final   Special Requests   Final    BOTTLES DRAWN AEROBIC AND  ANAEROBIC Blood Culture adequate volume   Culture   Final    NO GROWTH < 12 HOURS Performed at [facility], [address] [place], [place] [address]    Report Status PENDING  Incomplete  Culture, blood (routine x 2) Call MD if unable to obtain prior to antibiotics being given     Status: None (Preliminary result)   Collection Time: 01/27/24 10:12 PM   Specimen: BLOOD RIGHT HAND  Result Value Ref Range Status   Specimen Description BLOOD RIGHT HAND  Final   Special Requests   Final    BOTTLES DRAWN AEROBIC ONLY Blood Culture results may not be optimal due to an inadequate volume of blood received in culture bottles   Culture   Final    NO GROWTH < 12 HOURS Performed at [facility], [address], [place], [place] [address]    Report Status PENDING  Incomplete  Respiratory (~20 pathogens) panel by PCR     Status: None   Collection Time: 01/28/24  7:00 AM   Specimen: Nasopharyngeal Swab; Respiratory  Result Value Ref Range Status   Adenovirus NOT DETECTED NOT DETECTED Final   Coronavirus 229E NOT DETECTED NOT DETECTED Final    Comment: (NOTE) The Coronavirus on the Respiratory Panel, DOES NOT test for the novel  Coronavirus (2019 nCoV)    Coronavirus HKU1 NOT DETECTED NOT DETECTED Final   Coronavirus NL63 NOT DETECTED NOT DETECTED Final   Coronavirus OC43 NOT DETECTED NOT DETECTED Final   Metapneumovirus NOT DETECTED NOT DETECTED Final   Rhinovirus / Enterovirus NOT DETECTED NOT DETECTED Final   Influenza A NOT DETECTED NOT DETECTED Final   Influenza B NOT DETECTED NOT DETECTED Final   Parainfluenza Virus 1 NOT DETECTED NOT DETECTED Final   Parainfluenza Virus 2 NOT DETECTED NOT DETECTED Final   Parainfluenza Virus 3 NOT DETECTED NOT DETECTED Final   Parainfluenza Virus 4 NOT DETECTED NOT DETECTED Final   Respiratory Syncytial Virus NOT DETECTED NOT DETECTED Final   Bordetella pertussis NOT DETECTED NOT DETECTED Final   Bordetella Parapertussis NOT DETECTED NOT  DETECTED Final   Chlamydophila pneumoniae NOT DETECTED NOT DETECTED Final   Mycoplasma pneumoniae NOT DETECTED NOT DETECTED Final    Comment: Performed at [facility], [address] [place], [place]  [address]  MRSA Next Gen by PCR, Nasal     Status: None   Collection Time: 01/28/24  7:00 AM   Specimen: Nasopharyngeal Swab; Nasal Swab  Result Value Ref Range Status   MRSA by PCR Next Gen NOT DETECTED NOT DETECTED Final    Comment: (NOTE) The GeneXpert MRSA Assay (FDA approved for NASAL specimens only), is one component of a comprehensive MRSA colonization surveillance program. It is not intended to diagnose MRSA infection nor to guide or monitor treatment for MRSA infections. Test performance is not FDA approved in patients less than 78 years old. Performed at [facility], [address] [place], [place] [address]     Radiology Studies: DG Swallowing Func-Speech Pathology Result Date: 01/28/2024 Table formatting from the original result was not included. Images from the original result were not included. Modified Barium Swallow Study Patient Details Name: Orestes Geiman MRN: [id number] Date of Birth: 02-14-1928 Today's Date: 01/28/2024 HPI/PMH: HPI: Kharee Lesesne is a 89 y.o. male who presented to emergency department complaining of clinical deterioration over the course of last 4 days with associated cough, immobilization (patient usually walks with cane and walker but cannot get out of the bed due to generalized weakness), and patient also found hypoxia 80 to 84% at home not using any home oxygen  at baseline also painful left mandibular swelling concerning for abscess.  Pt with 2 month hx dysphagia with OP MBS scheduled for 8/4, which has resulted in 40 lb weightloss. Maxillofacial CT 8/1: Findings consistent with left parotitis. No evidence of a  periparotid abscess.  Chest CT 8/2: 1.Bronchopneumonia in the left lower lobe.  2. Debris in the trachea extending into and occluding  the left mainstem  bronchus. Chest CT  Pt with medical history significant of for combined CHF reduced EF 30 to 35%, aortic stenosis s/p TAVR, CAD status post CABG, atrial fibrillation and flutter on Eliquis , CKD stage3a, chronic venous insufficiency of bilateral lower extremities, essential hypertension, chronic urinary retention-self cath as needed, hyperlipidemia, peripheral neuropathy, advanced prostate cancer, arthritis, and essential tremor Clinical Impression: Pt presents with a profound and life limiting orpharyngeal dysphagia c/b inability to initiate pharyngeal swallow response.  There was discoordinated lingual movement, delayed to absent swallow initiation, minimal laryngeal elevation, minimal to no anterior hyoid excusion, absent epiglottice inversion, absent laryngeal closure, and minimal to no UES opening.  These deficits resulted in inability to transit bolus trials into the esophagus.  Pt brought up portions of the bolus trials for removal via oral suction, while residuals remained in pharynx. There was frank aspiration of honey thick liquid mixed with vallecular resiude of thin liquid trials.  Bolus trials were given by spoon, and severity of deficits posed safety concerns with any additional trials during this study.  Pt's deficits are seemingly chronic in nature with osteophytes (confirmed on MRI 01/16/24) at the level of the UES which impinge on the esophagus and appear to prevent UES opening.  There is likely some acute on chronic dysphagia in setting of current pna and parotitis; however, pt with noted dysphagia during July visit to ED and he has lost around 40 lbs 2/2 dysphagia.  Pt is not safe for an oral diet at this time.  Consider alternate means of nutrition and whether or not long term AMN would be compatible with goals of care.  Pt may have ice chips and small amounts (1/2 tsp or less) of unthickened waterby spoon in moderation, after good oral care, when fully awake/alert, with 1:1  assistance.  Pt will aspirate these trials and oral care is therefore of utmost important.  Small amounts of water are low risk for causing aspiration penumonia and will help to keep oral mucosa healthy and moist, maintain musculature of swallowing, and thin and mobilize secretions. DIGEST Swallow Severity Rating*  Safety:  4  Efficiency: 4  Overall Pharyngeal Swallow Severity: 4 1: mild; 2: moderate; 3: severe; 4: profound *The Dynamic Imaging Grade of Swallowing Toxicity is standardized for the head and neck cancer population, however, demonstrates promising clinical applications across populations to standardize the clinical rating of pharyngeal swallow safety and severity. Cervical osteophytes preventing bolus flow: Factors that may increase risk of adverse event in presence of aspiration Noe & Lianne 2021): Factors that may increase risk of adverse event in presence of aspiration Noe & Lianne 2021): Poor general health and/or compromised immunity; Limited mobility; Frequent aspiration of large volumes; Aspiration of thick, dense, and/or acidic materials; Frail or deconditioned Recommendations/Plan: Swallowing Evaluation Recommendations Swallowing Evaluation Recommendations Recommendations: NPO Medication Administration: Via alternative means Recommended consults: Consider Palliative care Treatment Plan Treatment Plan Treatment recommendations: Therapy as outlined in treatment plan below Follow-up recommendations: -- (TBD) Functional status assessment: Patient has had a recent decline in their functional status and demonstrates the ability to make significant improvements in function in a reasonable and predictable amount of time. Treatment frequency: Min 2x/week Treatment duration: 2 weeks Interventions: Trials of upgraded texture/liquids; Patient/family education Recommendations Recommendations for follow up therapy are one component of a multi-disciplinary discharge planning process, led by the  attending physician.  Recommendations may be updated based on patient status, additional functional criteria and insurance authorization. Assessment: Orofacial Exam: Orofacial Exam Oral Cavity: Oral Hygiene: Dried secretions (Thick, tenacious, yellow/green secretions on soft palate.  SLP unable to clear with swab and suction.) Oral Cavity - Dentition: Adequate natural dentition Orofacial Anatomy: -- (L facial swelling from parotitis) Oral Motor/Sensory Function: Other (comment) (Decreased lingual strenghth, slowness to lateralize, L facial asymmetry 2/2 parotitis.  Dysarthria noted.) Anatomy: Anatomy: Suspected cervical osteophytes (Confirmed via MRI 01/16/24) Boluses Administered: Boluses Administered Boluses Administered: Thin liquids (Level 0); Moderately thick liquids (Level 3, honey thick)  Oral Impairment Domain: Oral Impairment Domain Lip Closure: No labial escape Tongue control during bolus hold: Posterior escape of less than half of bolus Bolus preparation/mastication: -- (Not tested) Bolus transport/lingual motion: Repetitive/disorganized tongue motion Oral residue: Trace residue lining oral structures Location of oral residue : Tongue Initiation of pharyngeal swallow : No visible initiation at any location; Pyriform sinuses  Pharyngeal Impairment Domain: Pharyngeal Impairment Domain Soft palate elevation: No bolus between soft palate (SP)/pharyngeal wall (PW) Laryngeal elevation: Minimal superior movement of thyroid  cartilage with minimal approximation of arytenoids to epiglottic petiole Anterior hyoid excursion: No anterior movement Epiglottic movement: No inversion Laryngeal vestibule closure: None, wide column air/contrast in laryngeal vestibule Pharyngeal contraction (A/P view only): N/A Pharyngoesophageal segment opening: No distension with total obstruction of flow Tongue base retraction: Trace column of contrast or air between tongue base and PPW Pharyngeal residue: Minimal to no pharyngeal  clearance Location of pharyngeal residue: Valleculae; Pyriform sinuses  Esophageal Impairment Domain: Esophageal Impairment Domain Esophageal clearance upright position: -- (Unable to assess) Pill: Pill Consistency administered: -- (Not assessed) Penetration/Aspiration Scale Score: Penetration/Aspiration Scale Score 8.  Material enters airway, passes BELOW cords without attempt by patient to eject out (silent aspiration) : Moderately thick liquids (Level 3, honey thick); Thin liquids (Level 0) Compensatory Strategies: Compensatory Strategies Compensatory strategies: No   General Information: Caregiver  present: No  Diet Prior to this Study: NPO   No data recorded  No data recorded  No data recorded  History of Recent Intubation: No  Behavior/Cognition: Alert; Cooperative; Pleasant mood No data recorded Baseline vocal quality/speech: -- (Dysarthria noted) Volitional Cough: Able to elicit Volitional Swallow: Unable to elicit (laryngeal bobbing without sufficient elevation to trigger pharyngeal swallow response) Exam Limitations: No limitations Goal Planning: Prognosis for improved oropharyngeal function: Guarded Barriers to Reach Goals: Severity of deficits No data recorded Patient/Family Stated Goal: Improve swallow function, gain weight Consulted and agree with results and recommendations: Patient; Physician; Nurse Pain: Pain Assessment Pain Assessment: Faces Faces Pain Scale: 0 End of Session: Start Time:SLP Start Time (ACUTE ONLY): 1153 Stop Time: SLP Stop Time (ACUTE ONLY): 1208 Time Calculation:SLP Time Calculation (min) (ACUTE ONLY): 15 min Charges: SLP Evaluations $ SLP Speech Visit: 1 Visit SLP Evaluations $MBS Swallow: 1 Procedure SLP visit diagnosis: SLP Visit Diagnosis: Dysphagia, oropharyngeal phase (R13.12) Past Medical History: Past Medical History: Diagnosis Date  Aortic stenosis   Arthritis   CAD (coronary artery disease)   CHF (congestive heart failure) (HCC)   Colitis   Edema   Essential hypertension    no med now  Essential tremor   Hyperlipidemia   Hypotonic bladder   L4-L5  Obstructive uropathy   Peripheral neuropathy   Elevated by Dr. love 2010  Prostate cancer [facility]) 2005 Past Surgical History: Past Surgical History: Procedure Laterality Date  ACHILLES TENDON SURGERY Bilateral 1995  AORTIC VALVE REPLACEMENT N/A 05/23/2020  Procedure: AORTIC VALVE REPLACEMENT (AVR) USING EDWARDS RESILIA 29 MM AORTIC VALVE.;  Surgeon: Lucas Dorise POUR, MD;  Location: [facility] OR;  Service: Open Heart Surgery;  Laterality: N/A;  CARDIAC CATHETERIZATION    CARDIOVERSION N/A 10/07/2022  Procedure: CARDIOVERSION;  Surgeon: Cherrie Toribio SAUNDERS, MD;  Location: [facility] INVASIVE CV LAB;  Service: Cardiovascular;  Laterality: N/A;  COLONOSCOPY    With polyp resection  CORONARY ARTERY BYPASS GRAFT N/A 05/23/2020  Procedure: CORONARY ARTERY BYPASS GRAFTING (CABG) USING LIMA to LAD; ENDOSCOPIC HARVESTED RIGHT GREATER SAPHENOUS VEIN: SVG to PD; SVG to SEQUENCED INTERMEDIATE to OM;  Surgeon: Lucas Dorise POUR, MD;  Location: [facility] OR;  Service: Open Heart Surgery;  Laterality: N/A;  ENDOVEIN HARVEST OF GREATER SAPHENOUS VEIN Right 05/23/2020  Procedure: ENDOVEIN HARVEST OF GREATER SAPHENOUS VEIN;  Surgeon: Lucas Dorise POUR, MD;  Location: [facility] OR;  Service: Open Heart Surgery;  Laterality: Right;  REVERSE SHOULDER ARTHROPLASTY Right 06/04/2016  REVERSE SHOULDER ARTHROPLASTY Right 06/04/2016  Procedure: REVERSE SHOULDER ARTHROPLASTY;  Surgeon: Marcey Her, MD;  Location: [facility] OR;  Service: Orthopedics;  Laterality: Right;  RIGHT/LEFT HEART CATH AND CORONARY ANGIOGRAPHY N/A 05/20/2020  Procedure: RIGHT/LEFT HEART CATH AND CORONARY ANGIOGRAPHY;  Surgeon: [place], Peter M, MD;  Location: [facility] INVASIVE CV LAB;  Service: Cardiovascular;  Laterality: N/A;  SKIN CANCER EXCISION  1974  On chest wall  TEE WITHOUT CARDIOVERSION N/A 05/23/2020  Procedure: TRANSESOPHAGEAL ECHOCARDIOGRAM (TEE);  Surgeon: Lucas Dorise POUR, MD;  Location: [facility] OR;  Service: Open Heart Surgery;   Laterality: N/A;  TOTAL KNEE ARTHROPLASTY Right 2011  TOTAL KNEE ARTHROPLASTY Left 04/09/2013  Procedure: LEFT TOTAL KNEE ARTHROPLASTY;  Surgeon: Dempsey LULLA Moan, MD;  Location: [facility] ORS;  Service: Orthopedics;  Laterality: Left; Anette FORBES Grippe, MA, CCC-SLP Acute Rehabilitation Services Office: [phone] 01/28/2024, 1:29 PM  CT Chest W Contrast Result Date: 01/28/2024 EXAM: CT CHEST WITH CONTRAST 01/28/2024 01:01:20 AM TECHNIQUE: CT of the chest was performed with the administration of intravenous contrast. Multiplanar  reformatted images are provided for review. Automated exposure control, iterative reconstruction, and/or weight based adjustment of the mA/kV was utilized to reduce the radiation dose to as low as reasonably achievable. COMPARISON: Same day chest radiograph and CT from 11/19/2022. CLINICAL HISTORY: Respiratory illness, nondiagnostic xray. FINDINGS: MEDIASTINUM: Cardiomegaly. No pericardial effusion. Coronary artery and aortic atherosclerotic calcifications. Aortic valve replacement. Sternotomy. Ascending aortic aneurysm measuring 42 mm. Fluid column in the esophagus compatible with reflux. LYMPH NODES: No mediastinal, hilar or axillary lymphadenopathy. LUNGS AND PLEURA: Layering debris in the trachea extending into and occluding the left mainstem bronchus. Debris fills the left lower lobe bronchi where there is atelectasis and poorly enhancing edema compatible with bronchopneumonia. Diffuse bronchial wall thickening. Scattered centrilobular nodules and tree-in-bud opacities greatest in the right middle lobe. No pleural effusion or pneumothorax. SOFT TISSUES/BONES: No acute abnormality of the bones or soft tissues. UPPER ABDOMEN: Limited images of the upper abdomen demonstrates no acute abnormality. IMPRESSION: 1. Bronchopneumonia in the left lower lobe. 2. Debris in the trachea extending into and occluding the left mainstem bronchus. 3. Chronic airway infection slash inflammation. 4. Ascending aortic  aneurysm measuring 42 mm. Annual follow up CTA of the chest is recommended. Electronically signed by: Norman Gatlin MD 01/28/2024 01:20 AM EDT RP Workstation: [id number]   CT Maxillofacial W Contrast Result Date: 01/27/2024 CLINICAL DATA:  Provided history: Infection. Abscess. EXAM: CT MAXILLOFACIAL WITH CONTRAST TECHNIQUE: Multidetector CT imaging of the maxillofacial structures was performed with intravenous contrast. Multiplanar CT image reconstructions were also generated. RADIATION DOSE REDUCTION: This exam was performed according to the departmental dose-optimization program which includes automated exposure control, adjustment of the mA and/or kV according to patient size and/or use of iterative reconstruction technique. CONTRAST:  75mL OMNIPAQUE  IOHEXOL  350 MG/ML SOLN COMPARISON:  Brain MRI 01/16/2024. FINDINGS: Streak/beam hardening artifact arising from dental restoration partially obscures the oral cavity and perioral soft tissues. Within this limitation, findings are as follows. Osseous: No acute maxillofacial fracture. Periapical lucency surrounding the left mandibular second molar tooth consistent with periodontal disease. Cervical spondylosis and multilevel vertebral ankylosis at the visible levels. Orbits: No orbital mass or acute orbital finding. Sinuses: Minimal mucosal thickening within the left maxillary sinus. Soft tissues: Asymmetric prominence and ill-defined enhancement of the left parotid gland with surrounding edema. Limited intracranial: No evidence of an acute intracranial abnormality within the field of view. Cerebral atrophy with infarcts in the supratentorial infratentorial brain, better assessed on the recent prior brain MRI of 07/19/2023. IMPRESSION: 1. Streak/beam hardening artifact arising from dental restoration partially obscures the oral cavity and perioral soft tissues. Within this limitation, findings are as follows. 2. Findings consistent with left parotitis. No evidence  of a periparotid abscess. 3. Periapical lucency (consistent with periodontal disease) surrounding the left mandibular second molar tooth. No appreciable surrounding soft tissue inflammatory changes or soft tissue abscess. 4. Minor left maxillary sinus mucosal thickening. Electronically Signed   By: Rockey Childs D.O.   On: 01/27/2024 19:25   DG Chest Portable 1 View Result Date: 01/27/2024 CLINICAL DATA:  Cough/aspiration EXAM: PORTABLE CHEST 1 VIEW COMPARISON:  Chest radiograph January 16, 2024 FINDINGS: Cardiomediastinal silhouette is enlarged.  Aortic knob is prominent. Status post median sternotomy, aortic valve replacement and CABG. Increased interstitial markings of both lung fields and increased conspicuity of clusters of pulmonary nodules for example in right lower lobe. No significant pleural effusions. Status post right shoulder hemiarthroplasty. Flattening of left humeral head and severe arthropathy of left glenohumeral joint. Severe dextroconvex scoliosis and degenerative  changes of the spine. IMPRESSION: Clusters of pulmonary nodules in right lung base and right middle lobe more conspicuous to prior. Correlate with chest CT findings in this patient with known malignancy. Enlarged cardiomediastinal silhouette, stable to prior. Electronically Signed   By: Megan  Zare M.D.   On: 01/27/2024 18:48    Scheduled Meds:  sodium chloride  flush  3 mL Intravenous Q12H   sodium chloride  flush  3 mL Intravenous Q12H   Continuous Infusions:  sodium chloride      [START ON 01/29/2024] ampicillin-sulbactam (UNASYN) IV     azithromycin  (ZITHROMAX ) 500 mg in sodium chloride  0.9 % 250 mL IVPB     dextrose  5% lactated ringers  75 mL/hr at 01/28/24 0258   heparin  800 Units/hr (01/28/24 1422)   metronidazole  500 mg (01/28/24 0955)     LOS: 1 day   35 minutes with more than 50% spent in reviewing records, counseling patient/family and coordinating care.  Reyes VEAR Gaw, MD [facility] [email or web address] 01/28/2024, 3:28 PM

## 2024-01-28 NOTE — Procedures (Signed)
 Modified Barium Swallow Study  Patient Details  Name: [name] [name] MRN: [id number] Date of Birth: 08/15/1927  Today's Date: 01/28/2024  Modified Barium Swallow completed.  Full report located under Chart Review in the Imaging Section.  History of Present Illness [name] [name] is a 89 y.o. male who presented to emergency department complaining of clinical deterioration over the course of last 4 days with associated cough, immobilization (patient usually walks with cane and walker but cannot get out of the bed due to generalized weakness), and patient also found hypoxia 80 to 84% at home not using any home oxygen  at baseline also painful left mandibular swelling concerning for abscess.  Pt with 2 month hx dysphagia with OP MBS scheduled for 8/4, which has resulted in 40 lb weightloss. Maxillofacial CT 8/1: Findings consistent with left parotitis. No evidence of a  periparotid abscess.  Chest CT 8/2: 1.Bronchopneumonia in the left lower lobe.  2. Debris in the trachea extending into and occluding the left mainstem  bronchus. 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  There was discoordinated lingual movement, delayed to absent swallow initiation, minimal laryngeal elevation, minimal to no anterior hyoid excusion, absent epiglottice inversion, absent laryngeal closure, and minimal to no UES opening.  These deficits resulted in inability to transit bolus trials into the esophagus.  Pt  brought up portions of the bolus trials for removal via oral suction, while residuals remained in pharynx. There was frank aspiration of honey thick liquid mixed with vallecular resiude of thin liquid trials.  Bolus trials were given by spoon, and severity of deficits posed safety concerns with any additional trials during this study.  Pt's deficits are seemingly chronic in nature with osteophytes (confirmed on MRI 01/16/24) at the level of the UES which impinge on the esophagus and appear to prevent UES opening.  There is likely some acute on chronic dysphagia in setting of current pna and parotitis; however, pt with noted dysphagia during July visit to ED and he has lost around 40 lbs 2/2 dysphagia.    Pt is not safe for an oral diet at this time.  Consider alternate means of nutrition and whether or not long term AMN would be compatible with goals of care.  Pt may have ice chips and small amounts (1/2 tsp or less) of unthickened waterby spoon in moderation, after good oral care, when fully awake/alert, with 1:1 assistance.  Pt will aspirate these trials and oral care is therefore of utmost important.  Small amounts of water are low risk for causing aspiration penumonia and will help to keep oral mucosa healthy and moist, maintain musculature of swallowing, and thin and mobilize secretions.  DIGEST Swallow Severity Rating*  Safety:  4  Efficiency: 4  Overall Pharyngeal Swallow Severity: 4 1: mild; 2: moderate; 3: severe; 4: profound  *The Dynamic Imaging Grade of Swallowing Toxicity is standardized for the head and neck cancer population, however, demonstrates promising clinical applications across populations to standardize the clinical rating of pharyngeal swallow safety and severity.    Cervical osteophytes preventing bolus flow:   Factors that  may increase risk of adverse event in presence of aspiration Noe & Lianne 2021): Poor general health and/or compromised immunity;Limited  mobility;Frequent aspiration of large volumes;Aspiration of thick, dense, and/or acidic materials;Frail or deconditioned  Swallow Evaluation Recommendations Recommendations: NPO Medication Administration: Via alternative means Recommended consults: Consider Palliative care      Anette FORBES Grippe, MA, CCC-SLP Acute Rehabilitation Services Office: [phone] 01/28/2024,1:27 PM

## 2024-01-28 NOTE — Progress Notes (Addendum)
      INFECTIOUS DISEASE ATTENDING ADDENDUM:   Date: 01/28/2024  Patient name: [name] [name]  Medical record number: [id number]  Date of birth: [birth year]   Alerted via Angelyn to MSSA bacteremia  MSSA makes quite a bit of sense in context of parotitis  Changing to ancef   MSSA is also undoubtedly culprit in his pneumonia as well   DC azithromycin , unasyn (latter would be coveering MSSA but not first line)  TTE  Repeat blood cultures in the am  FORMAL AUTO consult in the am.  Jomarie [place] Dam 01/28/2024, 6:18 PM

## 2024-01-28 NOTE — ED Notes (Signed)
 Patient transported to CT

## 2024-01-28 NOTE — Progress Notes (Signed)
 PHARMACY - PHYSICIAN COMMUNICATION CRITICAL VALUE ALERT - BLOOD CULTURE IDENTIFICATION (BCID)  [name] [name] is an 89 y.o. male who presented to [facility] on 01/27/2024 with a chief complaint of persistent cough, hypoxia, and painful parotitis.  Assessment: 1/6 bottles with staph aureus no resistance.   Name of physician (or Provider) Contacted: Dr. Reyes Gaw  Current antibiotics: Ceftriaxone  + metronidazole  today, unasyn tomorrow  Changes to prescribed antibiotics recommended:  Patient is on recommended antibiotics - No changes needed  Results for orders placed or performed during the hospital encounter of 01/27/24  Blood Culture ID Panel (Reflexed) (Collected: 01/27/2024  5:16 PM)  Result Value Ref Range   Enterococcus faecalis NOT DETECTED NOT DETECTED   Enterococcus Faecium NOT DETECTED NOT DETECTED   Listeria monocytogenes NOT DETECTED NOT DETECTED   Staphylococcus species DETECTED (A) NOT DETECTED   Staphylococcus aureus (BCID) DETECTED (A) NOT DETECTED   Staphylococcus epidermidis NOT DETECTED NOT DETECTED   Staphylococcus lugdunensis NOT DETECTED NOT DETECTED   Streptococcus species NOT DETECTED NOT DETECTED   Streptococcus agalactiae NOT DETECTED NOT DETECTED   Streptococcus pneumoniae NOT DETECTED NOT DETECTED   Streptococcus pyogenes NOT DETECTED NOT DETECTED   A.calcoaceticus-baumannii NOT DETECTED NOT DETECTED   Bacteroides fragilis NOT DETECTED NOT DETECTED   Enterobacterales NOT DETECTED NOT DETECTED   Enterobacter cloacae complex NOT DETECTED NOT DETECTED   Escherichia coli NOT DETECTED NOT DETECTED   Klebsiella aerogenes NOT DETECTED NOT DETECTED   Klebsiella oxytoca NOT DETECTED NOT DETECTED   Klebsiella pneumoniae NOT DETECTED NOT DETECTED   Proteus species NOT DETECTED NOT DETECTED   Salmonella species NOT DETECTED NOT DETECTED   Serratia marcescens NOT DETECTED NOT DETECTED   Haemophilus influenzae NOT DETECTED NOT DETECTED   Neisseria meningitidis NOT  DETECTED NOT DETECTED   Pseudomonas aeruginosa NOT DETECTED NOT DETECTED   Stenotrophomonas maltophilia NOT DETECTED NOT DETECTED   Candida albicans NOT DETECTED NOT DETECTED   Candida auris NOT DETECTED NOT DETECTED   Candida glabrata NOT DETECTED NOT DETECTED   Candida krusei NOT DETECTED NOT DETECTED   Candida parapsilosis NOT DETECTED NOT DETECTED   Candida tropicalis NOT DETECTED NOT DETECTED   Cryptococcus neoformans/gattii NOT DETECTED NOT DETECTED   Meth resistant mecA/C and MREJ NOT DETECTED NOT DETECTED    Thank you for allowing pharmacy to be involved with this patient's care.  Mendel Barter, PharmD PGY1 Clinical Pharmacist [facility]  01/28/2024 2:16 PM

## 2024-01-28 NOTE — Plan of Care (Signed)

## 2024-01-28 NOTE — Progress Notes (Signed)
 Performed intermittent catheterization due to bladder scan volume >851. Patient tolerated procedure well. A blood clot was noted in urine and some minimal blood present on end of catheter tip. Volume retrieved 1250.

## 2024-01-28 NOTE — Progress Notes (Signed)
 PHARMACY NOTE:  ANTIMICROBIAL RENAL DOSAGE ADJUSTMENT  Current antimicrobial regimen includes a mismatch between antimicrobial dosage and estimated renal function.  As per policy approved by the Pharmacy & Therapeutics and Medical Executive Committees, the antimicrobial dosage will be adjusted accordingly.  Current antimicrobial dosage:  cefazolin  2g IV q8h  Indication: bacteremia  Renal Function:  Estimated Creatinine Clearance: 24.6 mL/min (A) (by C-G formula based on SCr of 1.59 mg/dL (H)). []      On intermittent HD, scheduled: []      On CRRT    Antimicrobial dosage has been changed to:  cefazolin  2g IV q12h  Additional comments:   Thank you for allowing pharmacy to be a part of this patient's care.  [name] [name], [facility] 01/28/2024 6:18 PM

## 2024-01-29 ENCOUNTER — Inpatient Hospital Stay (HOSPITAL_COMMUNITY)

## 2024-01-29 DIAGNOSIS — B9561 Methicillin susceptible Staphylococcus aureus infection as the cause of diseases classified elsewhere: Secondary | ICD-10-CM

## 2024-01-29 DIAGNOSIS — Z7189 Other specified counseling: Secondary | ICD-10-CM | POA: Diagnosis not present

## 2024-01-29 DIAGNOSIS — I2510 Atherosclerotic heart disease of native coronary artery without angina pectoris: Secondary | ICD-10-CM

## 2024-01-29 DIAGNOSIS — R7881 Bacteremia: Secondary | ICD-10-CM

## 2024-01-29 DIAGNOSIS — Z515 Encounter for palliative care: Secondary | ICD-10-CM | POA: Diagnosis not present

## 2024-01-29 DIAGNOSIS — Z711 Person with feared health complaint in whom no diagnosis is made: Secondary | ICD-10-CM | POA: Diagnosis not present

## 2024-01-29 DIAGNOSIS — Z952 Presence of prosthetic heart valve: Secondary | ICD-10-CM

## 2024-01-29 DIAGNOSIS — N183 Chronic kidney disease, stage 3 unspecified: Secondary | ICD-10-CM

## 2024-01-29 LAB — GLUCOSE, CAPILLARY
Glucose-Capillary: 130 mg/dL — ABNORMAL HIGH (ref 70–99)
Glucose-Capillary: 140 mg/dL — ABNORMAL HIGH (ref 70–99)
Glucose-Capillary: 147 mg/dL — ABNORMAL HIGH (ref 70–99)
Glucose-Capillary: 147 mg/dL — ABNORMAL HIGH (ref 70–99)
Glucose-Capillary: 164 mg/dL — ABNORMAL HIGH (ref 70–99)

## 2024-01-29 LAB — APTT
aPTT: 60 s — ABNORMAL HIGH (ref 24–36)
aPTT: 68 s — ABNORMAL HIGH (ref 24–36)

## 2024-01-29 LAB — COMPREHENSIVE METABOLIC PANEL WITH GFR
ALT: 12 U/L (ref 0–44)
AST: 20 U/L (ref 15–41)
Albumin: 2.3 g/dL — ABNORMAL LOW (ref 3.5–5.0)
Alkaline Phosphatase: 59 U/L (ref 38–126)
Anion gap: 12 (ref 5–15)
BUN: 30 mg/dL — ABNORMAL HIGH (ref 8–23)
CO2: 24 mmol/L (ref 22–32)
Calcium: 8.3 mg/dL — ABNORMAL LOW (ref 8.9–10.3)
Chloride: 112 mmol/L — ABNORMAL HIGH (ref 98–111)
Creatinine, Ser: 1.13 mg/dL (ref 0.61–1.24)
GFR, Estimated: >60 mL/min (ref >60)
Glucose, Bld: 415 mg/dL — ABNORMAL HIGH (ref 70–99)
Potassium: 3.2 mmol/L — ABNORMAL LOW (ref 3.5–5.1)
Sodium: 148 mmol/L — ABNORMAL HIGH (ref 135–145)
Total Bilirubin: 0.8 mg/dL (ref 0.0–1.2)
Total Protein: 6 g/dL — ABNORMAL LOW (ref 6.5–8.1)

## 2024-01-29 LAB — CBC
HCT: 36.9 % — ABNORMAL LOW (ref 39.0–52.0)
Hemoglobin: 11.8 g/dL — ABNORMAL LOW (ref 13.0–17.0)
MCH: 31.1 pg (ref 26.0–34.0)
MCHC: 32 g/dL (ref 30.0–36.0)
MCV: 97.1 fL (ref 80.0–100.0)
Platelets: 157 K/uL (ref 150–400)
RBC: 3.8 MIL/uL — ABNORMAL LOW (ref 4.22–5.81)
RDW: 13.8 % (ref 11.5–15.5)
WBC: 15.1 K/uL — ABNORMAL HIGH (ref 4.0–10.5)
nRBC: 0 % (ref 0.0–0.2)

## 2024-01-29 LAB — HEPARIN LEVEL (UNFRACTIONATED)
Heparin Unfractionated: 0.47 [IU]/mL (ref 0.30–0.70)
Heparin Unfractionated: 0.39 [IU]/mL (ref 0.30–0.70)

## 2024-01-29 LAB — ECHOCARDIOGRAM COMPLETE
Height: 69 in
Weight: 2137.58 [oz_av]

## 2024-01-29 LAB — PHOSPHORUS: Phosphorus: 2.8 mg/dL (ref 2.5–4.6)

## 2024-01-29 LAB — MAGNESIUM: Magnesium: 2.1 mg/dL (ref 1.7–2.4)

## 2024-01-29 MED ORDER — ACETAMINOPHEN 650 MG RE SUPP: 650.0000 mg | Freq: Four times a day (QID) | RECTAL | Status: DC | PRN

## 2024-01-29 MED ORDER — GLYCOPYRROLATE 1 MG PO TABS: 1.0000 mg | ORAL_TABLET | ORAL | Status: DC | PRN

## 2024-01-29 MED ORDER — POTASSIUM CHLORIDE 10 MEQ/100ML IV SOLN
10.0000 meq | INTRAVENOUS | Status: AC
  Administered 2024-01-29 (×3): 10 meq via INTRAVENOUS
  Filled 2024-01-29: qty 100

## 2024-01-29 MED ORDER — POLYVINYL ALCOHOL 1.4 % OP SOLN: 1.0000 [drp] | Freq: Four times a day (QID) | OPHTHALMIC | Status: DC | PRN

## 2024-01-29 MED ORDER — GLYCOPYRROLATE 0.2 MG/ML IJ SOLN
0.2000 mg | INTRAMUSCULAR | Status: DC | PRN
Start: 2024-01-29 — End: 2024-01-30

## 2024-01-29 MED ORDER — CEFAZOLIN SODIUM-DEXTROSE 2-4 GM/100ML-% IV SOLN
2.0000 g | Freq: Three times a day (TID) | INTRAVENOUS | Status: DC
  Administered 2024-01-29 (×2): 2 g via INTRAVENOUS
  Filled 2024-01-29 (×2): qty 100

## 2024-01-29 MED ORDER — POTASSIUM PHOSPHATES 15 MMOLE/5ML IV SOLN
10.0000 mmol | Freq: Once | INTRAVENOUS | Status: AC
  Administered 2024-01-29: 10 mmol via INTRAVENOUS
  Filled 2024-01-29: qty 3.33

## 2024-01-29 MED ORDER — GLYCOPYRROLATE 0.2 MG/ML IJ SOLN: 0.2000 mg | INTRAMUSCULAR | Status: DC | PRN

## 2024-01-29 MED ORDER — DEXTROSE-SODIUM CHLORIDE 5-0.45 % IV SOLN: INTRAVENOUS | Status: DC

## 2024-01-29 MED ORDER — ACETAMINOPHEN 325 MG PO TABS: 650.0000 mg | ORAL_TABLET | Freq: Four times a day (QID) | ORAL | Status: DC | PRN

## 2024-01-29 NOTE — Consult Note (Signed)
 Date of Admission:  01/27/2024          Reason for Consult: MSSA bacteremia     Referring Provider: Reyes Gaw, MD   Assessment:  MSSA bacteremia likley due to  Bacterial parotitis left side with (this is NOT Mumps) Pneumonia and in present of  TAVR  Dysphagia with patient barely able to eat food for months and now not able to even take pills CKD CAD Goals of care, EOL discussion  Plan:  Will continue cefazolin  for now Ordered 2D echocardiogram but clearly we will absolutely not entertain the idea of TEE I would order repeat blood cultures but given that we are likely pivoting to comfort care I am going to spare him the pain of repeat blood cultures I explained to him and his wife that ordinarily if we are pursuing aggressive care I would likely give him 6 weeks of IV antibiotics for presumptive prosthetic valve endocarditis--though I would not use an aminoglycoside in this pt Agree with pivot to comfort care DC droplet precautions   Principal Problem:   MSSA bacteremia Active Problems:   HTN (hypertension)   Hyperlipidemia   Sepsis due to pneumonia (HCC)   Venous insufficiency   Chronic combined systolic and diastolic CHF (congestive heart failure) (HCC)   Chronic kidney disease (CKD), stage III (moderate) (HCC)   S/P AVR   Acute hypoxic respiratory failure (HCC)   Parotitis, acute   Dysphagia and weight loss for 2 months   Failure to thrive in adult   History of CAD (coronary artery disease)   Paroxysmal atrial fibrillation (HCC)   Prostate cancer (HCC)   Aortic aneurysm (HCC)   Scheduled Meds:  sodium chloride  flush  3 mL Intravenous Q12H   sodium chloride  flush  3 mL Intravenous Q12H   Continuous Infusions:   ceFAZolin  (ANCEF ) IV Stopped (01/29/24 0858)   dextrose  5 % and 0.9 % NaCl 50 mL/hr at 01/29/24 1200   heparin  900 Units/hr (01/29/24 1200)   potassium chloride  10 mEq (01/29/24 1327)   potassium PHOSPHATE  IVPB (in mmol) 42 mL/hr at  01/29/24 1200   PRN Meds:.acetaminophen  **OR** acetaminophen , ipratropium-albuterol , metoprolol  tartrate, ondansetron  **OR** ondansetron  (ZOFRAN ) IV, phenol, sodium chloride  flush  HPI: [name] [name] is a 89 y.o. male with multiple medical problems with CHF aortic stenosis that is post TAVR coronary disease status post coronary bypass grafting, atrial fibrillation and flutter chronic kidney disease chronic venous insufficiency chronic urinary retention requiring self-catheterization advanced prostate cancer who has had difficulty eating hardly any food over the last several months and even having difficulty taking pills more recently.  He has been losing weight and progressively worsening clinically.  This past Friday he abruptly developed acute and painful swelling of his parotid gland also developed a progressive productive cough.  In the ER he was found to indeed have significant enlargement and of his left parotid gland and parotitis he also was hypoxic.  Imaging including chest x-ray showed some pulmonary nodules of the right lung base  Maxillofacial showed left parotitis but no abscess CT chest showed evidence of bronchopneumonia left lower lobe with debris in the trachea that was also occluding the left mainstem bronchus.   His blood cultures have subsequently turned positive for Staphylococcus aureus.  Changed him from ceftriaxone  and azithromycin  to cefazolin .  I ordered a 2D echocardiogram.  I think his MSSA bacteremia likely originated his parotid gland but now also involving the lungs and potentially his prosthetic aortic valve.  He is currently in general coming to the end of his life it appears.  As an example when his swallowing function was evaluated was found that there were cervical osteophytes that were preventing the flow of boluses past the esophagus.  It seems like very likely that the patient and family will opt for comfort care rather than aggressive care that would  involve placement of a feeding tube in treating with long-term IV antibiotics.  For now we will continue the cefazolin  he is on.  I will check back in and see was going on the chart tomorrow but anticipate they will be pivoting to pure comfort care and in this case he may very well die while in the hospital  I have personally spent 84 minutes involved in face-to-face and non-face-to-face activities for this patient on the day of the visit. Professional time spent includes the following activities: Preparing to see the patient (review of tests), Obtaining and/or reviewing separately obtained history (admission/discharge record), Performing a medically appropriate examination and/or evaluation , Ordering medications/tests/procedures, referring and communicating with other health care professionals, Documenting clinical information in the EMR, Independently interpreting results (not separately reported), Communicating results to the patient/family/caregiver, Counseling and educating the patient/family/caregiver and Care coordination (not separately reported).   Evaluation of the patient requires complex antimicrobial therapy evaluation, counseling , isolation needs to reduce disease transmission and risk assessment and mitigation.     Review of Systems: Review of Systems  Unable to perform ROS: Critical illness    Past Medical History:  Diagnosis Date   Aortic stenosis    Arthritis    CAD (coronary artery disease)    CHF (congestive heart failure) (HCC)    Colitis    Edema    Essential hypertension    no med now   Essential tremor    Hyperlipidemia    Hypotonic bladder    L4-L5   Obstructive uropathy    Peripheral neuropathy    Elevated by Dr. love 2010   Prostate cancer [facility]) 2005    Social History   Tobacco Use   Smoking status: Never   Smokeless tobacco: Never  Vaping Use   Vaping status: Never Used  Substance Use Topics   Alcohol  use: Yes    Alcohol /week: 1.0 standard  drink of alcohol     Types: 1 Glasses of wine per week    Comment: occ   Drug use: No    Family History  Problem Relation Age of Onset   Hypertension Mother    Heart failure Mother    Diabetes Mother    Heart attack Brother    Heart attack Maternal Aunt    Allergies  Allergen Reactions   Simvastatin Other (See Comments)    caused neuropathy pain    OBJECTIVE: Blood pressure (!) 113/98, pulse 73, temperature 98.4 F (36.9 C), temperature source Oral, resp. rate 20, height 5' 9 (1.753 m), weight 60.6 kg, SpO2 95%.  Physical Exam Constitutional:      Appearance: He is ill-appearing.  HENT:     Head:     Comments: Left sided enlarged and tender parotid gland Eyes:     General:        Right eye: No discharge.        Left eye: No discharge.  Cardiovascular:     Rate and Rhythm: Tachycardia present.     Heart sounds: No murmur heard.    No friction rub. No gallop.  Pulmonary:     Effort: Accessory muscle  usage and retractions present.     Breath sounds: Rhonchi present.  Abdominal:     General: There is no distension.  Skin:    General: Skin is warm.     Coloration: Skin is pale.  Neurological:     General: No focal deficit present.     Mental Status: He is alert and oriented to person, place, and time.     Lab Results Lab Results  Component Value Date   WBC 15.1 (H) 01/29/2024   HGB 11.8 (L) 01/29/2024   HCT 36.9 (L) 01/29/2024   MCV 97.1 01/29/2024   PLT 157 01/29/2024    Lab Results  Component Value Date   CREATININE 1.13 01/29/2024   BUN 30 (H) 01/29/2024   NA 148 (H) 01/29/2024   K 3.2 (L) 01/29/2024   CL 112 (H) 01/29/2024   CO2 24 01/29/2024    Lab Results  Component Value Date   ALT 12 01/29/2024   AST 20 01/29/2024   ALKPHOS 59 01/29/2024   BILITOT 0.8 01/29/2024     Microbiology: Recent Results (from the past 240 hours)  Blood culture (routine x 2)     Status: Abnormal (Preliminary result)   Collection Time: 01/27/24  5:16 PM    Specimen: BLOOD LEFT ARM  Result Value Ref Range Status   Specimen Description BLOOD LEFT ARM  Final   Special Requests   Final    BOTTLES DRAWN AEROBIC AND ANAEROBIC Blood Culture adequate volume   Culture  Setup Time   Final    GRAM POSITIVE COCCI IN CLUSTERS AEROBIC BOTTLE ONLY CRITICAL RESULT CALLED TO, READ BACK BY AND VERIFIED WITH: PHARMD MADELINE MITCHELL [id number] BY J RAZZAK, MT    Culture (A)  Final    STAPHYLOCOCCUS AUREUS SUSCEPTIBILITIES TO FOLLOW Performed at [facility], [address] [place], [place] [address]    Report Status PENDING  Incomplete  Blood Culture ID Panel (Reflexed)     Status: Abnormal   Collection Time: 01/27/24  5:16 PM  Result Value Ref Range Status   Enterococcus faecalis NOT DETECTED NOT DETECTED Final   Enterococcus Faecium NOT DETECTED NOT DETECTED Final   Listeria monocytogenes NOT DETECTED NOT DETECTED Final   Staphylococcus species DETECTED (A) NOT DETECTED Final    Comment: CRITICAL RESULT CALLED TO, READ BACK BY AND VERIFIED WITH: PHARMD MADELINE MITCHELL [id number] 1407 BY J RAZZAK, MT    Staphylococcus aureus (BCID) DETECTED (A) NOT DETECTED Final    Comment: CRITICAL RESULT CALLED TO, READ BACK BY AND VERIFIED WITH: PHARMD MADELINE MITCHELL [id number] 1407 BY J RAZZAK, MT    Staphylococcus epidermidis NOT DETECTED NOT DETECTED Final   Staphylococcus lugdunensis NOT DETECTED NOT DETECTED Final   Streptococcus species NOT DETECTED NOT DETECTED Final   Streptococcus agalactiae NOT DETECTED NOT DETECTED Final   Streptococcus pneumoniae NOT DETECTED NOT DETECTED Final   Streptococcus pyogenes NOT DETECTED NOT DETECTED Final   A.calcoaceticus-baumannii NOT DETECTED NOT DETECTED Final   Bacteroides fragilis NOT DETECTED NOT DETECTED Final   Enterobacterales NOT DETECTED NOT DETECTED Final   Enterobacter cloacae complex NOT DETECTED NOT DETECTED Final   Escherichia coli NOT DETECTED NOT DETECTED Final   Klebsiella aerogenes NOT  DETECTED NOT DETECTED Final   Klebsiella oxytoca NOT DETECTED NOT DETECTED Final   Klebsiella pneumoniae NOT DETECTED NOT DETECTED Final   Proteus species NOT DETECTED NOT DETECTED Final   Salmonella species NOT DETECTED NOT DETECTED Final   Serratia marcescens NOT DETECTED NOT  DETECTED Final   Haemophilus influenzae NOT DETECTED NOT DETECTED Final   Neisseria meningitidis NOT DETECTED NOT DETECTED Final   Pseudomonas aeruginosa NOT DETECTED NOT DETECTED Final   Stenotrophomonas maltophilia NOT DETECTED NOT DETECTED Final   Candida albicans NOT DETECTED NOT DETECTED Final   Candida auris NOT DETECTED NOT DETECTED Final   Candida glabrata NOT DETECTED NOT DETECTED Final   Candida krusei NOT DETECTED NOT DETECTED Final   Candida parapsilosis NOT DETECTED NOT DETECTED Final   Candida tropicalis NOT DETECTED NOT DETECTED Final   Cryptococcus neoformans/gattii NOT DETECTED NOT DETECTED Final   Meth resistant mecA/C and MREJ NOT DETECTED NOT DETECTED Final    Comment: Performed at [facility], [address], [place], [place] [address]  Blood culture (routine x 2)     Status: Abnormal (Preliminary result)   Collection Time: 01/27/24  8:06 PM   Specimen: BLOOD  Result Value Ref Range Status   Specimen Description BLOOD SITE NOT SPECIFIED  Final   Special Requests   Final    BOTTLES DRAWN AEROBIC ONLY Blood Culture adequate volume   Culture  Setup Time   Final    GRAM POSITIVE COCCI AEROBIC BOTTLE ONLY CRITICAL VALUE NOTED.  VALUE IS CONSISTENT WITH PREVIOUSLY REPORTED AND CALLED VALUE. Performed at [name], [address], [place], [place] [address]    Culture STAPHYLOCOCCUS AUREUS (A)  Final   Report Status PENDING  Incomplete  Culture, blood (routine x 2) Call MD if unable to obtain prior to antibiotics being given     Status: None (Preliminary result)   Collection Time: 01/27/24 10:12 PM   Specimen: BLOOD RIGHT ARM  Result Value Ref Range Status   Specimen  Description BLOOD RIGHT ARM  Final   Special Requests   Final    BOTTLES DRAWN AEROBIC AND ANAEROBIC Blood Culture adequate volume   Culture   Final    NO GROWTH < 12 HOURS Performed at [facility], [address], [place], [place] [address]    Report Status PENDING  Incomplete  Culture, blood (routine x 2) Call MD if unable to obtain prior to antibiotics being given     Status: None (Preliminary result)   Collection Time: 01/27/24 10:12 PM   Specimen: BLOOD RIGHT HAND  Result Value Ref Range Status   Specimen Description BLOOD RIGHT HAND  Final   Special Requests   Final    BOTTLES DRAWN AEROBIC ONLY Blood Culture results may not be optimal due to an inadequate volume of blood received in culture bottles   Culture   Final    NO GROWTH < 12 HOURS Performed at [facility] Lab, [address], [place], [place] [address]    Report Status PENDING  Incomplete  Respiratory (~20 pathogens) panel by PCR     Status: None   Collection Time: 01/28/24  7:00 AM   Specimen: Nasopharyngeal Swab; Respiratory  Result Value Ref Range Status   Adenovirus NOT DETECTED NOT DETECTED Final   Coronavirus 229E NOT DETECTED NOT DETECTED Final    Comment: (NOTE) The Coronavirus on the Respiratory Panel, DOES NOT test for the novel  Coronavirus (2019 nCoV)    Coronavirus HKU1 NOT DETECTED NOT DETECTED Final   Coronavirus NL63 NOT DETECTED NOT DETECTED Final   Coronavirus OC43 NOT DETECTED NOT DETECTED Final   Metapneumovirus NOT DETECTED NOT DETECTED Final   Rhinovirus / Enterovirus NOT DETECTED NOT DETECTED Final   Influenza A NOT  DETECTED NOT DETECTED Final   Influenza B NOT DETECTED NOT DETECTED Final   Parainfluenza Virus 1 NOT DETECTED NOT DETECTED Final   Parainfluenza Virus 2 NOT DETECTED NOT DETECTED Final   Parainfluenza Virus 3 NOT DETECTED NOT DETECTED Final   Parainfluenza Virus 4 NOT DETECTED NOT DETECTED Final   Respiratory Syncytial Virus NOT DETECTED NOT DETECTED Final    Bordetella pertussis NOT DETECTED NOT DETECTED Final   Bordetella Parapertussis NOT DETECTED NOT DETECTED Final   Chlamydophila pneumoniae NOT DETECTED NOT DETECTED Final   Mycoplasma pneumoniae NOT DETECTED NOT DETECTED Final    Comment: Performed at [facility] Lab, [address] [place], [place] [address]  MRSA Next Gen by PCR, Nasal     Status: None   Collection Time: 01/28/24  7:00 AM   Specimen: Nasopharyngeal Swab; Nasal Swab  Result Value Ref Range Status   MRSA by PCR Next Gen NOT DETECTED NOT DETECTED Final    Comment: (NOTE) The GeneXpert MRSA Assay (FDA approved for NASAL specimens only), is one component of a comprehensive MRSA colonization surveillance program. It is not intended to diagnose MRSA infection nor to guide or monitor treatment for MRSA infections. Test performance is not FDA approved in patients less than 17 years old. Performed at [facility], [address], [place], [place] [address]     Jomarie Fleeta Rothman, MD [facility] [phone] pager  01/29/2024, 1:57 PM

## 2024-01-29 NOTE — Consult Note (Signed)
 Palliative Medicine Inpatient Consult Note  Consulting Provider:  Elpidio Reyes DEL, MD   Reason for consult:   Question Answer  Palliative Care Consult Services Palliative Medicine Consult  Reason for Consult? 89 year old with advanced prostate cancer admitted for pneumonia and found to have severe dysphagia and deemed unsafe for oral diet at this time.   01/29/2024  HPI:  Per intake H&P --> 89 y.o. 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 to 35%, aortic stenosis s/p TAVR, CAD status post CABG, atrial fibrillation and flutter on Eliquis , CKD stage3a, chronic venous insufficiency of bilateral lower extremities, essential hypertension, chronic urinary retention-self cath as needed, hyperlipidemia, peripheral neuropathy, advanced prostate cancer, arthritis, and essential tremor. He is being treated for CAP and acute parotitis. Palliative care has been requested due to significant weight loss as a result of adult FTT in the setting of dysphagia.   Clinical Assessment/Goals of Care:  *Please note that this is a verbal dictation therefore any spelling or grammatical errors are due to the Dragon Medical One system interpretation.  I have reviewed medical records including EPIC notes, labs and imaging, received report from bedside RN, assessed the patient who is sitting up in bed in NAD.    I met with [name] [name] and [name] [name] to further discuss diagnosis prognosis, GOC, EOL wishes, disposition and options.   I introduced Palliative Medicine as specialized medical care for people living with serious illness. It focuses on providing relief from the symptoms and stress of a serious illness. The goal is to improve quality of life for both the patient and the family.  Medical History Review and Understanding:  A review of [name] [name]'s past medical history significant for atrial fibrillation on Eliquis , stage III chronic kidney disease, aortic stenosis requiring a  TAVR, congestive heart failure, prostate cancer, hypertension, and urinary retention was completed.  Social History:  [name] [name] shares he is from [place] .  He moved to [place]  in the setting of working for [employer] which she was an employee of for greater than 20+ years.  He and his wife have been married since 1966.  He has 2 children and 4 grandchildren.  He shares that he is a man of faith and practices within [name] [name] is some he attends the [employer].  Functional and Nutritional State:  Prior to hospitalization [name] [name] was living at home with his wife.  She has been his primary caregiver for the past 4 years following a CABG.  He had had recent falls in the home requiring EMS to come and help get him up.  He has become more dependent on B ADLs as of the last 3 days.  He has aggressively had a decline in his appetite and 40 pound weight loss over the past few months.  Advance Directives:  A detailed discussion was had today regarding advanced directives.  A review of [name]'s living will in [place] and was reviewed.   Code Status:  Concepts specific to code status, artifical feeding and hydration, continued IV antibiotics and rehospitalization was had.  The difference between a aggressive medical intervention path  and a palliative comfort care path for this patient at this time was had.   Encouraged patient/family to consider DNR/DNI status understanding evidenced based poor outcomes in similar hospitalized patient, as the cause of arrest is likely associated with advanced chronic/terminal illness rather than an easily reversible acute cardio-pulmonary event. I explained that DNR/DNI does not change the medical plan and it  only comes into effect after a person has arrested (died).  It is a protective measure to keep us  from harming the patient in their last moments of life. [name] [name]  was agreeable to DNR/DNI with understanding that patient would not receive CPR, defibrillation, ACLS medications, or  intubation.   Discussion:  We reviewed that [name] [name] has had more dependence on his wife since 4 years ago when he had his CABG surgery.  Prior to that he and his wife are avid travelers.  He had however had a good quality of life preceding the last month which has been more trial some in the setting of his needs, recurrent falls, and adult failure to thrive.  We reviewed the reason for hospitalization and active problems since.  One of the more troubling issues at hand is his dysphagia.  We talked about options such as artificial nutrition and discussed the pros and cons of this.  Both patient and his wife agree that it would prolong life but not enhance quality of life which is a point we all agreed upon.  We reviewed the differences between continuing down the current care path which is considered more aggressive to see if improvements can be made versus transitioning to more of a comfort mediated path. We talked about transition to comfort measures in house and what that would entail inclusive of medications to control pain, dyspnea, agitation, nausea, itching, and hiccups.  We discussed stopping all uneccessary measures such as cardiac monitoring, blood draws, needle sticks, and frequent vital signs.   [name] [name] shares he was hopeful to make it to a week from now when his daughter is visiting.  He emphasizes the importance of seeing his daughter and son prior to him passing away.  We discussed the idea of having them visit sooner than later.  We reviewed once comfort measures are started at that time would likely be limited in consideration of hospice either at home or in an inpatient hospice facility.  Patient's wife and he determined it would be easier if he goes to an inpatient hospice facility with a preference towards [facility].  They are both aware that the timeframe of life is limited to 2 weeks or less once someone transitions there.  We reviewed how difficult all of these decisions are as this  seems like is it has all come on suddenly. Utilized reflective listening throughout our time together.   Patient's family would like time to talk and also to gain insights from Dr. Elpidio prior to further decisions.  We discussed we would continue conversations into the oncoming days.  Discussed the importance of continued conversation with family and their  medical providers regarding overall plan of care and treatment options, ensuring decisions are within the context of the patients values and GOCs.  Decision Maker: Brisk,[name] (Spouse): [phone] (Mobile)   SUMMARY OF RECOMMENDATIONS   DNAR/DNI  Open and honest conversations held in the setting of patient's acute on chronic disease burden inclusive of notable dysphagia  Patient and his wife would like to take time to talk and consider options  Appreciate Dr. Elpidio speaking to family additionally  Ongoing palliative care support  Code Status/Advance Care Planning: DNAR/DNI   Symptom Management:   Palliative Prophylaxis:  Aspiration, Bowel Regimen, Delirium Protocol, Frequent Pain Assessment, Oral Care, Palliative Wound Care, and Turn Reposition  Additional Recommendations (Limitations, Scope, Preferences): Continue current care  Psycho-social/Spiritual:  Desire for further Chaplaincy support: Patient is [place] -he is active with [place]  Additional Recommendations:  Education on chronic disease burden   Prognosis: Limited in the setting of dysphagia, adult failure to thrive, acute illness  Discharge Planning: Discharge plan uncertain at this time  Vitals:   01/29/24 0020 01/29/24 0350  BP: 113/63 (!) 112/90  Pulse: 77 75  Resp: (!) 24 (!) 23  Temp: 98.2 F (36.8 C) 98.5 F (36.9 C)  SpO2: 97% 95%    Intake/Output Summary (Last 24 hours) at 01/29/2024 9293 Last data filed at 01/29/2024 9570 Gross per 24 hour  Intake 1748.08 ml  Output 1669 ml  Net 79.08 ml   Last Weight  Most recent update: 01/29/2024  3:53  AM    Weight  60.6 kg (133 lb 9.6 oz)            Gen: Elderly Caucasian male chronically ill in appearance HEENT: Dry mucous membranes CV: Regular rate and irregular rhythm PULM: On 6 L nasal cannula breathing is even nonlabored ABD: soft/nontender  EXT: No edema Neuro: Alert and oriented x2-3  PPS: 10%   This conversation/these recommendations were discussed with patient primary care team, Dr. Elpidio ______________________________________________________ Rosaline Becton [facility] Palliative Medicine Team Team Cell Phone: [phone] Please utilize secure chat with additional questions, if there is no response within 30 minutes please call the above phone number  Total Time: 40 Billing based on MDM: High  Palliative Medicine Team providers are available by phone from 7am to 7pm daily and can be reached through the team cell phone.  Should this patient require assistance outside of these hours, please call the patient's attending physician.

## 2024-01-29 NOTE — Progress Notes (Signed)
 PHARMACY NOTE:  ANTIMICROBIAL RENAL DOSAGE ADJUSTMENT  Current antimicrobial regimen includes a mismatch between antimicrobial dosage and estimated renal function.  As per policy approved by the Pharmacy & Therapeutics and Medical Executive Committees, the antimicrobial dosage will be adjusted accordingly.  Current antimicrobial dosage:  cefazolin  2g IV q12h  Indication: bacteremia  Renal Function:  Estimated Creatinine Clearance: 35 mL/min (by C-G formula based on SCr of 1.13 mg/dL). []      On intermittent HD, scheduled: []      On CRRT    Antimicrobial dosage has been changed to:  cefazolin  2g IV q8h  Additional comments: renal function has improved  Thank you for involving pharmacy in this patient's care.  Delon Sax, PharmD, BCPS Clinical Pharmacist Clinical phone for 01/29/2024 is [phone] 01/29/2024 7:51 AM

## 2024-01-29 NOTE — Progress Notes (Addendum)
 PROGRESS NOTE  [name] [name]  FMW:[id number] DOB: Feb 20, 1928 DOA: 01/27/2024 PCP: Aisha Harvey, MD  Consultants  Addendum: 01/29/2024 6:52 PM Came back to speak with patient and wife.  They've discussed the situation and [name] [name] made the decision earlier today to move to full comfort care.  I'll put in the order set.   Brief Narrative: 89 y.o. 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 to 35%, aortic stenosis s/p TAVR, CAD status post CABG, atrial fibrillation and flutter on Eliquis , CKD stage3a, chronic venous insufficiency of bilateral lower extremities, essential hypertension, chronic urinary retention-self cath as needed, hyperlipidemia, peripheral neuropathy, advanced prostate cancer, arthritis, and essential tremor presented to emergency department complaining of clinical deterioration over the course of last 4 days with associated cough, immobilization (patient usually walks with cane and walker but cannot get out of the bed due to generalized weakness), and patient also found hypoxia 80 to 84% at home not using any home oxygen  at baseline also painful left mandibular swelling concerning for abscess.   Wife reported that patient has some dysphagia and he has been scheduled for swallow study on 8/4 which has been interfering patient's nutrition for last 2 months causing 40 pound weight loss. Patient denies any fever, chill, nausea, vomiting, constipation or diarrhea.  Admitted for pneumonia and acute hypoxic respiratory failure as well as failure to thrive in adult.   Assessment & Plan: Goals of care: - Long discussion with patient and his wife today.  I explained to them about his diagnosis of heart failure, ongoing aspiration pneumonia, dysphagia and inability to swallow. -They had already spoken with palliative care today and are leaning more towards comfort care but have not fully made that decision yet. -His main goal is to still be awake and alive when  his children come to visit later this week. - I did discuss with patient and wife that they should be coming ASAP and wife plans to call them today to tell them to come - As of today he is DNR-Limited.  As above, moving to comfort care soon --but officially not yet at this time  Community-acquired pneumonia Acute hypoxic respiratory failure in the setting of pneumonia - Currently on Unasyn to cover for presumed aspiration pneumonia as well as parotitis, see below. -ID has now seen after formal consult for staph bacteremia, see below. -Antibiotics now cefazolin   MSSA bacteremia: - 1 out of 6 bottles.  On IV cefazolin .  Appreciate ID input. -Awaiting comfort care decisions   Acute parotitis, unilateral Physical exam revealed left-sided parotid gland swelling with tenderness on palpation.   - Also with notable tenderness/redness - Now on chest Ancef  as above.   Failure to thrive in adult Failure to thrive in the setting of advanced prostate cancer and multiple comorbidities. -Moving towards comfort care with ultimate goal being [facility] once family has decided on this   Dysphagia -Patient's wife reported that patient has dysphagia for almost 2 months that causing around 40 pounds weight loss --worsen for the past several days.   - Patient with MBSS here conducted by speech pathology. -Found to have osteophytes at level of UES impinging on esophagus and preventing UES opening. -Patient deemed to be not safe for an oral diet at this time. - I did discuss pros and cons of placing feeding tube and patient/wife have decided not to pursue this.   Aortic aneurysm - CTA chest showed ascending aortic aneurysm 4.2 cm, would need annual CTA chest  follow-up.   Paroxysmal atrial fibrillation -Continue IV heparin  drip as patient unable to swallow Eliquis .  Continue cardiac monitoring.  Continue IV Lopressor  as needed for persistently.  Heart rate upper 120s range. -Can DC once made comfort  care   HFrEF 30 to 35% History of chronic hypotension. -At home patient is not on any blood pressure regimen. -Unable to continue oral midodrine  as patient is NPO. -Blood pressure has been acceptable here in the 110 to 120s systolic. -Need to be careful with IV fluids in light of this.  Hypernatremia: - He is below replacement levels of IV fluids because of his heart failure.  I will bump this up today a little bit and we will see how he does tomorrow. - At risk for fluid overload but already showing signs of dehydration with inability to take oral food or drink.   History of CAD status post CABG -Continue IV heparin  drip.   History of aortic valve stenosis   Advanced prostate cancer -As patient failed swallow screen holding oral Avodart .   Chronic urinary retention and BPH -Continue bladder scan and In-N-Out cath every 6 hours as needed.    CKD 3B - Creatinine downtrending from admission.  Will follow -  renal function at baseline.  Continue to monitor renal function and avoid nephrotoxic agents.  Hypokalemia: - replaced potassium via IV  DVT prophylaxis:  SCDs Start: 01/27/24 2122 Place TED hose Start: 01/27/24 2122  Code Status:   Code Status: Limited: Do not attempt resuscitation (DNR) -DNR-LIMITED -Do Not Intubate/DNI  Family Communication: Discussed with wife at bedside.  All questions answered Level of care: Telemetry Cardiac Status is: Inpatient  Consults called: Palliative care, infectious disease  Subjective: Patient lying in bed. Still with some coughing.  He also reports being thirsty.  Objective: Vitals:   01/29/24 0020 01/29/24 0350 01/29/24 1047 01/29/24 1136  BP: 113/63 (!) 112/90 127/74 (!) 113/98  Pulse: 77 75  73  Resp: (!) 24 (!) 23  20  Temp: 98.2 F (36.8 C) 98.5 F (36.9 C) 97.8 F (36.6 C) 98.4 F (36.9 C)  TempSrc: Oral Oral Oral Oral  SpO2: 97% 95%  95%  Weight:  60.6 kg    Height:        Intake/Output Summary (Last 24 hours) at  01/29/2024 1604 Last data filed at 01/29/2024 1400 Gross per 24 hour  Intake 2510.69 ml  Output 625 ml  Net 1885.69 ml   Filed Weights   01/27/24 2129 01/28/24 0130 01/29/24 0350  Weight: 63 kg 59.9 kg 60.6 kg   Body mass index is 19.73 kg/m.  Gen: 89 y.o. male in no apparent distress.  Frail-appearing Pulm: Non-labored breathing.  Nasal cannula in place.  Diffuse rhonchi throughout all anterior lung fields. CV: Regular rate and rhythm.  GI: Abdomen soft, non-tender, non-distended Ext: Warm, no deformities, trace pedal edema Skin: No rashes, lesions  Neuro: Alert and oriented. No focal neurological deficits. Psych: Calm  Judgement and insight appear normal. Mood & affect appropriate.     I have personally reviewed the following labs and images: CBC: Recent Labs  Lab 01/27/24 1720 01/28/24 1127 01/29/24 0317  WBC 14.7* 11.2* 15.1*  NEUTROABS 12.9*  --   --   HGB 14.4 10.4* 11.8*  HCT 43.7 33.9* 36.9*  MCV 94.4 103.7* 97.1  PLT 184 121* 157   BMP &GFR Recent Labs  Lab 01/27/24 1720 01/29/24 0317  NA 142 148*  K 3.8 3.2*  CL 102 112*  CO2 26 24  GLUCOSE 180* 415*  BUN 43* 30*  CREATININE 1.59* 1.13  CALCIUM  9.1 8.3*  MG  --  2.1  PHOS  --  2.8   Estimated Creatinine Clearance: 35 mL/min (by C-G formula based on SCr of 1.13 mg/dL). Liver & Pancreas: Recent Labs  Lab 01/27/24 1720 01/29/24 0317  AST 28 20  ALT 15 12  ALKPHOS 74 59  BILITOT 2.2* 0.8  PROT 7.5 6.0*  ALBUMIN  3.2* 2.3*   No results for input(s): LIPASE, AMYLASE in the last 168 hours. No results for input(s): AMMONIA in the last 168 hours. Diabetic: No results for input(s): HGBA1C in the last 72 hours. Recent Labs  Lab 01/29/24 0021 01/29/24 0423 01/29/24 0630 01/29/24 1133  GLUCAP 130* 140* 147* 147*   Cardiac Enzymes: Recent Labs  Lab 01/27/24 2212  CKTOTAL 30*   No results for input(s): PROBNP in the last 8760 hours. Coagulation Profile: No results for input(s):  INR, PROTIME in the last 168 hours. Thyroid  Function Tests: No results for input(s): TSH, T4TOTAL, FREET4, T3FREE, THYROIDAB in the last 72 hours. Lipid Profile: No results for input(s): CHOL, HDL, LDLCALC, TRIG, CHOLHDL, LDLDIRECT in the last 72 hours. Anemia Panel: No results for input(s): VITAMINB12, FOLATE, FERRITIN, TIBC, IRON, RETICCTPCT in the last 72 hours. Urine analysis:    Component Value Date/Time   COLORURINE AMBER (A) 01/27/2024 1802   APPEARANCEUR HAZY (A) 01/27/2024 1802   LABSPEC 1.015 01/27/2024 1802   PHURINE 5.0 01/27/2024 1802   GLUCOSEU NEGATIVE 01/27/2024 1802   HGBUR NEGATIVE 01/27/2024 1802   BILIRUBINUR NEGATIVE 01/27/2024 1802   KETONESUR NEGATIVE 01/27/2024 1802   PROTEINUR NEGATIVE 01/27/2024 1802   UROBILINOGEN 0.2 08/14/2014 1115   NITRITE NEGATIVE 01/27/2024 1802   LEUKOCYTESUR LARGE (A) 01/27/2024 1802   Sepsis Labs: Invalid input(s): PROCALCITONIN, LACTICIDVEN  Microbiology: Recent Results (from the past 240 hours)  Blood culture (routine x 2)     Status: Abnormal (Preliminary result)   Collection Time: 01/27/24  5:16 PM   Specimen: BLOOD LEFT ARM  Result Value Ref Range Status   Specimen Description BLOOD LEFT ARM  Final   Special Requests   Final    BOTTLES DRAWN AEROBIC AND ANAEROBIC Blood Culture adequate volume   Culture  Setup Time   Final    GRAM POSITIVE COCCI IN CLUSTERS AEROBIC BOTTLE ONLY CRITICAL RESULT CALLED TO, READ BACK BY AND VERIFIED WITH: PHARMD MADELINE MITCHELL [id number] 1407 BY J RAZZAK, MT    Culture (A)  Final    STAPHYLOCOCCUS AUREUS SUSCEPTIBILITIES TO FOLLOW Performed at [facility], [address] [place], [place] [address]    Report Status PENDING  Incomplete  Blood Culture ID Panel (Reflexed)     Status: Abnormal   Collection Time: 01/27/24  5:16 PM  Result Value Ref Range Status   Enterococcus faecalis NOT DETECTED NOT DETECTED Final   Enterococcus  Faecium NOT DETECTED NOT DETECTED Final   Listeria monocytogenes NOT DETECTED NOT DETECTED Final   Staphylococcus species DETECTED (A) NOT DETECTED Final    Comment: CRITICAL RESULT CALLED TO, READ BACK BY AND VERIFIED WITH: PHARMD MADELINE MITCHELL [id number] 1407 BY J RAZZAK, MT    Staphylococcus aureus (BCID) DETECTED (A) NOT DETECTED Final    Comment: CRITICAL RESULT CALLED TO, READ BACK BY AND VERIFIED WITH: PHARMD MADELINE MITCHELL [id number] 1407 BY J RAZZAK, MT    Staphylococcus epidermidis NOT DETECTED NOT DETECTED Final   Staphylococcus lugdunensis NOT DETECTED NOT DETECTED Final  Streptococcus species NOT DETECTED NOT DETECTED Final   Streptococcus agalactiae NOT DETECTED NOT DETECTED Final   Streptococcus pneumoniae NOT DETECTED NOT DETECTED Final   Streptococcus pyogenes NOT DETECTED NOT DETECTED Final   A.calcoaceticus-baumannii NOT DETECTED NOT DETECTED Final   Bacteroides fragilis NOT DETECTED NOT DETECTED Final   Enterobacterales NOT DETECTED NOT DETECTED Final   Enterobacter cloacae complex NOT DETECTED NOT DETECTED Final   Escherichia coli NOT DETECTED NOT DETECTED Final   Klebsiella aerogenes NOT DETECTED NOT DETECTED Final   Klebsiella oxytoca NOT DETECTED NOT DETECTED Final   Klebsiella pneumoniae NOT DETECTED NOT DETECTED Final   Proteus species NOT DETECTED NOT DETECTED Final   Salmonella species NOT DETECTED NOT DETECTED Final   Serratia marcescens NOT DETECTED NOT DETECTED Final   Haemophilus influenzae NOT DETECTED NOT DETECTED Final   Neisseria meningitidis NOT DETECTED NOT DETECTED Final   Pseudomonas aeruginosa NOT DETECTED NOT DETECTED Final   Stenotrophomonas maltophilia NOT DETECTED NOT DETECTED Final   Candida albicans NOT DETECTED NOT DETECTED Final   Candida auris NOT DETECTED NOT DETECTED Final   Candida glabrata NOT DETECTED NOT DETECTED Final   Candida krusei NOT DETECTED NOT DETECTED Final   Candida parapsilosis NOT DETECTED NOT DETECTED Final    Candida tropicalis NOT DETECTED NOT DETECTED Final   Cryptococcus neoformans/gattii NOT DETECTED NOT DETECTED Final   Meth resistant mecA/C and MREJ NOT DETECTED NOT DETECTED Final    Comment: Performed at [facility], [address] [place], [place] [address]  Blood culture (routine x 2)     Status: Abnormal (Preliminary result)   Collection Time: 01/27/24  8:06 PM   Specimen: BLOOD  Result Value Ref Range Status   Specimen Description BLOOD SITE NOT SPECIFIED  Final   Special Requests   Final    BOTTLES DRAWN AEROBIC ONLY Blood Culture adequate volume   Culture  Setup Time   Final    GRAM POSITIVE COCCI AEROBIC BOTTLE ONLY CRITICAL VALUE NOTED.  VALUE IS CONSISTENT WITH PREVIOUSLY REPORTED AND CALLED VALUE. Performed at [facility], [address], [place], [place] [address]    Culture STAPHYLOCOCCUS AUREUS (A)  Final   Report Status PENDING  Incomplete  Culture, blood (routine x 2) Call MD if unable to obtain prior to antibiotics being given     Status: None (Preliminary result)   Collection Time: 01/27/24 10:12 PM   Specimen: BLOOD RIGHT ARM  Result Value Ref Range Status   Specimen Description BLOOD RIGHT ARM  Final   Special Requests   Final    BOTTLES DRAWN AEROBIC AND ANAEROBIC Blood Culture adequate volume   Culture   Final    NO GROWTH < 12 HOURS Performed at [facility], [address], [place], [place] [address]    Report Status PENDING  Incomplete  Culture, blood (routine x 2) Call MD if unable to obtain prior to antibiotics being given     Status: None (Preliminary result)   Collection Time: 01/27/24 10:12 PM   Specimen: BLOOD RIGHT HAND  Result Value Ref Range Status   Specimen Description BLOOD RIGHT HAND  Final   Special Requests   Final    BOTTLES DRAWN AEROBIC ONLY Blood Culture results may not be optimal due to an inadequate volume of blood received in culture bottles   Culture   Final    NO GROWTH < 12 HOURS Performed at [facility], [address] [place], [place]  [address]    Report Status PENDING  Incomplete  Respiratory (~20 pathogens) panel by PCR     Status: None   Collection Time: 01/28/24  7:00 AM   Specimen: Nasopharyngeal Swab; Respiratory  Result Value Ref Range Status   Adenovirus NOT DETECTED NOT DETECTED Final   Coronavirus 229E NOT DETECTED NOT DETECTED Final    Comment: (NOTE) The Coronavirus on the Respiratory Panel, DOES NOT test for the novel  Coronavirus (2019 nCoV)    Coronavirus HKU1 NOT DETECTED NOT DETECTED Final   Coronavirus NL63 NOT DETECTED NOT DETECTED Final   Coronavirus OC43 NOT DETECTED NOT DETECTED Final   Metapneumovirus NOT DETECTED NOT DETECTED Final   Rhinovirus / Enterovirus NOT DETECTED NOT DETECTED Final   Influenza A NOT DETECTED NOT DETECTED Final   Influenza B NOT DETECTED NOT DETECTED Final   Parainfluenza Virus 1 NOT DETECTED NOT DETECTED Final   Parainfluenza Virus 2 NOT DETECTED NOT DETECTED Final   Parainfluenza Virus 3 NOT DETECTED NOT DETECTED Final   Parainfluenza Virus 4 NOT DETECTED NOT DETECTED Final   Respiratory Syncytial Virus NOT DETECTED NOT DETECTED Final   Bordetella pertussis NOT DETECTED NOT DETECTED Final   Bordetella Parapertussis NOT DETECTED NOT DETECTED Final   Chlamydophila pneumoniae NOT DETECTED NOT DETECTED Final   Mycoplasma pneumoniae NOT DETECTED NOT DETECTED Final    Comment: Performed at [facility], [address], [place], [place] [address]  MRSA Next Gen by PCR, Nasal     Status: None   Collection Time: 01/28/24  7:00 AM   Specimen: Nasopharyngeal Swab; Nasal Swab  Result Value Ref Range Status   MRSA by PCR Next Gen NOT DETECTED NOT DETECTED Final    Comment: (NOTE) The GeneXpert MRSA Assay (FDA approved for NASAL specimens only), is one component of a comprehensive MRSA colonization surveillance program. It is not intended to diagnose MRSA infection nor to guide or monitor treatment for MRSA infections. Test  performance is not FDA approved in patients less than 74 years old. Performed at [facility], [address] [place], [place] [address]     Radiology Studies: No results found.  Scheduled Meds:  sodium chloride  flush  3 mL Intravenous Q12H   sodium chloride  flush  3 mL Intravenous Q12H   Continuous Infusions:   ceFAZolin  (ANCEF ) IV Stopped (01/29/24 0858)   dextrose  5 % and 0.9 % NaCl 50 mL/hr at 01/29/24 1400   heparin  900 Units/hr (01/29/24 1400)   potassium PHOSPHATE  IVPB (in mmol) 42 mL/hr at 01/29/24 1400     LOS: 2 days   55 minutes with more than 50% spent in reviewing records, counseling patient/family and coordinating care.  Reyes VEAR Gaw, MD [facility] [email or web address] 01/29/2024, 4:04 PM

## 2024-01-29 NOTE — Progress Notes (Signed)
 ANTICOAGULATION CONSULT NOTE  Pharmacy Consult for Heparin  Indication: atrial fibrillation  Allergies  Allergen Reactions   Simvastatin Other (See Comments)    caused neuropathy pain    Patient Measurements: Height: 5' 9 (175.3 cm) Weight: 60.6 kg (133 lb 9.6 oz) IBW/kg (Calculated) : 70.7 Heparin  Dosing Weight: 63 kg  Vital Signs: Temp: 98.5 F (36.9 C) (08/03 0350) Temp Source: Oral (08/03 0350) BP: 112/90 (08/03 0350) Pulse Rate: 75 (08/03 0350)  Labs: Recent Labs    01/27/24 1720 01/27/24 1720 01/27/24 2212 01/28/24 1127 01/28/24 2139 01/29/24 0317  HGB 14.4  --   --  10.4*  --  11.8*  HCT 43.7  --   --  33.9*  --  36.9*  PLT 184  --   --  121*  --  157  APTT  --    < > 33 108* 68* 60*  HEPARINUNFRC  --   --  0.50 0.47  --  0.47  CREATININE 1.59*  --   --   --   --  1.13  CKTOTAL  --   --  30*  --   --   --    < > = values in this interval not displayed.    Estimated Creatinine Clearance: 35 mL/min (by C-G formula based on SCr of 1.13 mg/dL).  Assessment: Patient with a history of HF, aortic stenosis s/p TAVR, CAD s/p CABG, AF on eliquis , CKD, chronic venous insufficiency, HTN, chronic urinary retention, HLD, peripheral neuropathy, prostate cancer, tremor. Patient presenting with cough, unable to get out of bed, hypoxia, and painful left mandibular swelling concern for abscess. Heparin  per pharmacy consult placed for atrial fibrillation while unable to take PO apixaban .  Patient is on apixaban  prior to arrival. Last dose 7/30 per med rec. Will require aPTT monitoring due to likely falsely high anti-Xa level secondary to DOAC use.  aPTT 60 is subtherapeutic with heparin  running at 800 units/hr. Heparin  level is 0.47 which is therapeutic. Appears that levels may be starting to correlate. Hgb (11.8) and PLTs (157) are stable. Patient renal function is stable. Per RN, no report of pauses, issues with the line, or signs of bleeding. Continue to monitor using aPTT  until levels fully correlate.  Goal of Therapy:  Heparin  level 0.3-0.7 units/ml aPTT 66-102 seconds Monitor platelets by anticoagulation protocol: Yes   Plan:  Increase heparin  to 900 units/hr Recheck aPTT and heparin  level after 8 hours to confirm therapeutic and assess if levels are correlating Monitor daily aPTT, heparin  levels, and CBC Monitor for any signs/symptoms of bleeding Continue to use a PTT for monitoring until aPTT and heparin  level correlate  Thank you for allowing pharmacy to be involved with this patient's care.  Mendel Barter, PharmD PGY1 Clinical Pharmacist [facility]  01/29/2024 7:14 AM

## 2024-01-29 NOTE — Progress Notes (Signed)
 ANTICOAGULATION CONSULT NOTE  Pharmacy Consult for Heparin  Indication: atrial fibrillation  Allergies  Allergen Reactions   Simvastatin Other (See Comments)    caused neuropathy pain    Patient Measurements: Height: 5' 9 (175.3 cm) Weight: 60.6 kg (133 lb 9.6 oz) IBW/kg (Calculated) : 70.7 Heparin  Dosing Weight: 63 kg  Vital Signs: Temp: 97.7 F (36.5 C) (08/03 1647) Temp Source: Oral (08/03 1647) BP: 115/75 (08/03 1647) Pulse Rate: 73 (08/03 1647)  Labs: Recent Labs    01/27/24 1720 01/27/24 2212 01/27/24 2212 01/28/24 1127 01/28/24 2139 01/29/24 0317 01/29/24 1637  HGB 14.4  --   --  10.4*  --  11.8*  --   HCT 43.7  --   --  33.9*  --  36.9*  --   PLT 184  --   --  121*  --  157  --   APTT  --  33   < > 108* 68* 60* 68*  HEPARINUNFRC  --  0.50   < > 0.47  --  0.47 0.39  CREATININE 1.59*  --   --   --   --  1.13  --   CKTOTAL  --  30*  --   --   --   --   --    < > = values in this interval not displayed.    Estimated Creatinine Clearance: 35 mL/min (by C-G formula based on SCr of 1.13 mg/dL).  Assessment: Patient with a history of HF, aortic stenosis s/p TAVR, CAD s/p CABG, AF on eliquis , CKD, chronic venous insufficiency, HTN, chronic urinary retention, HLD, peripheral neuropathy, prostate cancer, tremor. Patient presenting with cough, unable to get out of bed, hypoxia, and painful left mandibular swelling concern for abscess. Heparin  per pharmacy consult placed for atrial fibrillation while unable to take PO apixaban .  Patient is on apixaban  prior to arrival. Last dose 7/30 per med rec. Will require aPTT monitoring due to likely falsely high anti-Xa level secondary to DOAC use.  aPTT and heparin  level therapeutic, close to correlating.  Goal of Therapy:  Heparin  level 0.3-0.7 units/ml aPTT 66-102 seconds Monitor platelets by anticoagulation protocol: Yes   Plan:  Heparin  900 units/h Daily HL, aPTT, CBC  Ozell Jamaica, PharmD, BCPS, [facility] Clinical  Pharmacist [phone] Please check AMION for all [facility] numbers 01/29/2024

## 2024-01-29 NOTE — Progress Notes (Signed)
 Echocardiogram 2D Echocardiogram has been performed.  [name] [name] 01/29/2024, 5:53 PM

## 2024-01-29 NOTE — Progress Notes (Signed)
 OT Cancellation Note  Patient Details Name: [name] [name] MRN: [id number] DOB: [birth year]   Cancelled Treatment:    Reason Eval/Treat Not Completed: Other (comment) (Per Palliative Care team note, pt and family are discussing POC goals/possible transition to comfort care/hospice. Per messaging with MD, OT to hold eval today to provide family with time to discuss. OT to reattempt to see pt tomorrow as appropriate.)  Margarie Rockey HERO., OTR/L, MA Acute Rehab [phone]   Margarie FORBES Horns 01/29/2024, 12:57 PM

## 2024-01-29 NOTE — Plan of Care (Signed)
  Problem: Education: Goal: Knowledge of General Education information will improve Description: Including pain rating scale, medication(s)/side effects and non-pharmacologic comfort measures Outcome: Progressing   Problem: Coping: Goal: Level of anxiety will decrease Outcome: Progressing   Problem: Health Behavior/Discharge Planning: Goal: Ability to manage health-related needs will improve Outcome: Not Progressing   Problem: Clinical Measurements: Goal: Ability to maintain clinical measurements within normal limits will improve Outcome: Not Progressing   Problem: Activity: Goal: Risk for activity intolerance will decrease Outcome: Not Progressing   Problem: Activity: Goal: Ability to tolerate increased activity will improve Outcome: Not Progressing

## 2024-01-30 ENCOUNTER — Other Ambulatory Visit (HOSPITAL_COMMUNITY): Payer: Self-pay

## 2024-01-30 ENCOUNTER — Encounter (HOSPITAL_COMMUNITY): Payer: Self-pay

## 2024-01-30 ENCOUNTER — Inpatient Hospital Stay (HOSPITAL_COMMUNITY)

## 2024-01-30 ENCOUNTER — Inpatient Hospital Stay (HOSPITAL_COMMUNITY): Admission: RE | Admit: 2024-01-30 | Source: Ambulatory Visit

## 2024-01-30 DIAGNOSIS — Z7189 Other specified counseling: Secondary | ICD-10-CM | POA: Diagnosis not present

## 2024-01-30 DIAGNOSIS — B9561 Methicillin susceptible Staphylococcus aureus infection as the cause of diseases classified elsewhere: Secondary | ICD-10-CM | POA: Diagnosis not present

## 2024-01-30 DIAGNOSIS — R7881 Bacteremia: Secondary | ICD-10-CM | POA: Diagnosis not present

## 2024-01-30 DIAGNOSIS — Z515 Encounter for palliative care: Secondary | ICD-10-CM | POA: Diagnosis not present

## 2024-01-30 LAB — CULTURE, BLOOD (ROUTINE X 2)
Special Requests: ADEQUATE
Special Requests: ADEQUATE

## 2024-01-30 MED ORDER — PHENOL 1.4 % MT LIQD
1.0000 | OROMUCOSAL | 0 refills | Status: AC | PRN
Start: 2024-01-30 — End: ?
  Filled 2024-01-30: qty 30, fill #0

## 2024-01-30 MED ORDER — MORPHINE SULFATE (PF) 2 MG/ML IV SOLN: 1.0000 mg | INTRAVENOUS | Status: DC | PRN

## 2024-01-30 MED ORDER — GLYCOPYRROLATE 0.2 MG/ML IJ SOLN
0.4000 mg | INTRAMUSCULAR | 0 refills | Status: DC
  Filled 2024-01-30: qty 1, 1d supply, fill #0

## 2024-01-30 MED ORDER — MORPHINE SULFATE 2 MG/ML IJ SOLN: 1.0000 mg | INTRAMUSCULAR | Status: AC | PRN

## 2024-01-30 MED ORDER — POLYVINYL ALCOHOL 1.4 % OP SOLN
1.0000 [drp] | Freq: Four times a day (QID) | OPHTHALMIC | 0 refills | Status: AC | PRN
  Filled 2024-01-30: qty 15, 75d supply, fill #0

## 2024-01-30 MED ORDER — MORPHINE SULFATE (PF) 2 MG/ML IV SOLN
1.0000 mg | INTRAVENOUS | 0 refills | Status: DC | PRN
  Filled 2024-01-30: qty 1, 1d supply, fill #0

## 2024-01-30 MED ORDER — GLYCOPYRROLATE 0.2 MG/ML IJ SOLN
0.4000 mg | INTRAMUSCULAR | Status: DC
  Administered 2024-01-30 (×2): 0.4 mg via INTRAVENOUS
  Filled 2024-01-30 (×2): qty 2

## 2024-01-30 NOTE — Progress Notes (Signed)
 Brief ID Note -   Noted plans for comfort care and hospice discharge, plans to go to [facility].   ID will sign off.   Continue IV abx until patient and family care team ready to stop at discharge.    Please call us  back if any plans change   Corean Fireman, MSN, NP-C [facility] for Infectious Disease [facility]  [email or web address] Pager: [phone] Office: [phone] [facility] Main Line: [phone] *Secure Chat Communication Welcome

## 2024-01-30 NOTE — Progress Notes (Signed)
 OT Cancellation Note  Patient Details Name: [name] [name] MRN: [id number] DOB: April 26, 1928   Cancelled Treatment:    Reason Eval/Treat Not Completed: Other (comment) (Pt is comfort care. OT signing off.)  [facility] 01/30/2024, 5:58 AM Kreg Sink, OT/L   Acute OT Clinical Specialist Acute Rehabilitation Services Pager [phone] Office [phone]

## 2024-01-30 NOTE — Discharge Summary (Addendum)
 Physician Discharge Summary   Patient: [name] [name] MRN: [id number] DOB: 09/10/1927  Admit date:     01/27/2024  Discharge date: 01/30/24  Discharge Physician: Reyes VEAR Gaw   PCP: Aisha Harvey, MD   Recommendations at discharge:   Patient being discharged to [facility] for full hospice care.  Discharge Diagnoses: Principal Problem:   MSSA bacteremia Active Problems:   Sepsis due to pneumonia (HCC)   Parotitis, acute   HTN (hypertension)   Hyperlipidemia   Venous insufficiency   Chronic combined systolic and diastolic CHF (congestive heart failure) (HCC)   Chronic kidney disease (CKD), stage III (moderate) (HCC)   S/P AVR   Acute hypoxic respiratory failure (HCC)   Dysphagia and weight loss for 2 months   Failure to thrive in adult   History of CAD (coronary artery disease)   Paroxysmal atrial fibrillation (HCC)   Prostate cancer (HCC)   Aortic aneurysm (HCC)   Palliative care by specialist  Resolved Problems:   * No resolved hospital problems. *  Hospital Course: 89 y.o. 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 to 35%, aortic stenosis s/p TAVR, CAD status post CABG, atrial fibrillation and flutter on Eliquis , CKD stage3a, chronic venous insufficiency of bilateral lower extremities, essential hypertension, chronic urinary retention-self cath as needed, hyperlipidemia, peripheral neuropathy, advanced prostate cancer, arthritis, and essential tremor presented to emergency department complaining of clinical deterioration over the course of last 4 days with associated cough, immobilization (patient usually walks with cane and walker but cannot get out of the bed due to generalized weakness), and patient also found hypoxia 80 to 84% at home not using any home oxygen  at baseline also painful left mandibular swelling concerning for abscess.  Wife reported that patient has some dysphagia and he has been scheduled for swallow study on 8/4 which has  been interfering patient's nutrition for last 2 months causing 40 pound weight loss. Patient denies any fever, chill, nausea, vomiting, constipation or diarrhea.  Admitted for pneumonia and acute hypoxic respiratory failure as well as failure to thrive in adult.    After admission to the hospital he had MBSS which showed essentially inability to swallow due to ongoing aspiration.  CT chest showed debris in trachea and left lung.  Patient also had 1/6 cultures growing MSSA.  Multiple discussions with patient from primary hospitalist team as well as palliative care led to patient choosing full comfort care 01/29/2024.  IVs, medications stopped at that time.  Patient was able to be accepted to [facility] 01/30/2024 and therefore was discharged to [facility] to be with his family.     Assessment & Plan: Goals of care: - Full comfort care. -Bed now available at [facility].   Community-acquired pneumonia Acute hypoxic respiratory failure in the setting of pneumonia - Previously was being treated with Unasyn which was switched to Ancef  after MSSA bacteremia noted. - Now off all antibiotics   MSSA bacteremia: - 1 out of 6 bottles.  On IV cefazolin .  Appreciate ID input. - Off all antibiotics   Acute parotitis, unilateral Physical exam revealed left-sided parotid gland swelling with tenderness on palpation.   - Also with notable tenderness/redness - Now full comfort care.  Sepsis: - present on admission, but resolved after starting abx - added to DC summary due to CDI query   Dysphagia -Contributing factor to patient's comfort care as he has inability to swallow.  Patient and wife agreed no feeding tube.  Consultants: Palliative care, infectious disease Disposition: Hospice care Diet recommendation:  Discharge Diet Orders (From admission, onward)     Start     Ordered   01/30/24 0000  Diet - low sodium heart healthy        01/30/24 1334           Regular diet DISCHARGE  MEDICATION: Allergies as of 01/30/2024       Reactions   Simvastatin Other (See Comments)   caused neuropathy pain        Medication List     STOP taking these medications    apixaban  2.5 MG Tabs tablet Commonly known as: Eliquis    ascorbic acid 500 MG tablet Commonly known as: VITAMIN C   B COMPLEX 100 PO   Biotin 5000 MCG Tabs   CALCIUM  + VITAMIN D3 PO   CENTRUM SILVER PO   cholecalciferol 25 MCG (1000 UNIT) tablet Commonly known as: VITAMIN D3   Co Q-10 100 MG Caps   docusate sodium  100 MG capsule Commonly known as: COLACE   dutasteride  0.5 MG capsule Commonly known as: AVODART    Eliquis  5 MG Tabs tablet Generic drug: apixaban    feeding supplement (ENSURE COMPLETE) Liqd   ferrous sulfate 325 (65 FE) MG tablet   FIBER THERAPY PO   furosemide  40 MG tablet Commonly known as: LASIX    JOINT HEALTH PO   LUTEIN-ZEAXANTHIN PO   magnesium  oxide 400 (240 Mg) MG tablet Commonly known as: MAG-OX   midodrine  2.5 MG tablet Commonly known as: PROAMATINE    mirtazapine  7.5 MG tablet Commonly known as: REMERON    Orgovyx 120 MG tablet Generic drug: relugolix   pantoprazole  40 MG tablet Commonly known as: PROTONIX    Potassium 99 MG Tabs   PROSTATE COMPLETE PO   Saw Palmetto  450 MG Caps   sennosides-docusate sodium  8.6-50 MG tablet Commonly known as: SENOKOT-S   sulfaSALAzine  500 MG tablet Commonly known as: AZULFIDINE    tamsulosin  0.4 MG Caps capsule Commonly known as: FLOMAX    TURMERIC CURCUMIN PO       TAKE these medications    artificial tears ophthalmic solution Place 1 drop into both eyes 4 (four) times daily as needed for dry eyes.   aspirin  81 MG tablet Take 81 mg by mouth at bedtime.   morphine  2 MG/ML injection Inject 0.5-1 mLs (1-2 mg total) into the vein every 2 (two) hours as needed (dyspnea, pain).   OVER THE COUNTER MEDICATION Take 1 capsule by mouth daily. Neuriva   phenol 1.4 % Liqd Commonly known as:  CHLORASEPTIC Use as directed 1 spray in the mouth or throat as needed for throat irritation / pain.        Discharge Exam: Filed Weights   01/27/24 2129 01/28/24 0130 01/29/24 0350  Weight: 63 kg 59.9 kg 60.6 kg   Gen: 89 y.o. male in no apparent distress.  Frail-appearing, some garbled speech secondary to secretions but can be understood. Pulm: Non-labored breathing.  Nasal cannula in place.  Diffuse rhonchi throughout all anterior lung fields. CV: Regular rate and rhythm.  GI: Abdomen soft, non-tender, non-distended Ext: Warm, no deformities, trace pedal edema Skin: No rashes, lesions  Neuro: Fully alert and oriented. No focal neurological deficits. Psych: Calm  Judgement and insight appear normal. Mood & affect appropri  Condition at discharge: good  The results of significant diagnostics from this hospitalization (including imaging, microbiology, ancillary and laboratory) are listed below for reference.   Imaging Studies: DG Swallowing Func-Speech Pathology Result Date: 01/28/2024  Table formatting from the original result was not included. Images from the original result were not included. Modified Barium Swallow Study Patient Details Name: Jaecion Dempster MRN: [id number] Date of Birth: Dec 03, 1927 Today's Date: 01/28/2024 HPI/PMH: HPI: Jerauld Bostwick is a 89 y.o. male who presented to emergency department complaining of clinical deterioration over the course of last 4 days with associated cough, immobilization (patient usually walks with cane and walker but cannot get out of the bed due to generalized weakness), and patient also found hypoxia 80 to 84% at home not using any home oxygen  at baseline also painful left mandibular swelling concerning for abscess.  Pt with 2 month hx dysphagia with OP MBS scheduled for 8/4, which has resulted in 40 lb weightloss. Maxillofacial CT 8/1: Findings consistent with left parotitis. No evidence of a  periparotid abscess.  Chest CT 8/2: 1.Bronchopneumonia  in the left lower lobe.  2. Debris in the trachea extending into and occluding the left mainstem  bronchus. Chest CT  Pt with medical history significant of for combined CHF reduced EF 30 to 35%, aortic stenosis s/p TAVR, CAD status post CABG, atrial fibrillation and flutter on Eliquis , CKD stage3a, chronic venous insufficiency of bilateral lower extremities, essential hypertension, chronic urinary retention-self cath as needed, hyperlipidemia, peripheral neuropathy, advanced prostate cancer, arthritis, and essential tremor Clinical Impression: Pt presents with a profound and life limiting orpharyngeal dysphagia c/b inability to initiate pharyngeal swallow response.  There was discoordinated lingual movement, delayed to absent swallow initiation, minimal laryngeal elevation, minimal to no anterior hyoid excusion, absent epiglottice inversion, absent laryngeal closure, and minimal to no UES opening.  These deficits resulted in inability to transit bolus trials into the esophagus.  Pt brought up portions of the bolus trials for removal via oral suction, while residuals remained in pharynx. There was frank aspiration of honey thick liquid mixed with vallecular resiude of thin liquid trials.  Bolus trials were given by spoon, and severity of deficits posed safety concerns with any additional trials during this study.  Pt's deficits are seemingly chronic in nature with osteophytes (confirmed on MRI 01/16/24) at the level of the UES which impinge on the esophagus and appear to prevent UES opening.  There is likely some acute on chronic dysphagia in setting of current pna and parotitis; however, pt with noted dysphagia during July visit to ED and he has lost around 40 lbs 2/2 dysphagia.  Pt is not safe for an oral diet at this time.  Consider alternate means of nutrition and whether or not long term AMN would be compatible with goals of care.  Pt may have ice chips and small amounts (1/2 tsp or less) of unthickened waterby  spoon in moderation, after good oral care, when fully awake/alert, with 1:1 assistance.  Pt will aspirate these trials and oral care is therefore of utmost important.  Small amounts of water are low risk for causing aspiration penumonia and will help to keep oral mucosa healthy and moist, maintain musculature of swallowing, and thin and mobilize secretions. DIGEST Swallow Severity Rating*  Safety:  4  Efficiency: 4  Overall Pharyngeal Swallow Severity: 4 1: mild; 2: moderate; 3: severe; 4: profound *The Dynamic Imaging Grade of Swallowing Toxicity is standardized for the head and neck cancer population, however, demonstrates promising clinical applications across populations to standardize the clinical rating of pharyngeal swallow safety and severity. Cervical osteophytes preventing bolus flow: Factors that may increase risk of adverse event in presence of aspiration Noe & Lianne 2021): Factors  that may increase risk of adverse event in presence of aspiration Noe & Lianne 2021): Poor general health and/or compromised immunity; Limited mobility; Frequent aspiration of large volumes; Aspiration of thick, dense, and/or acidic materials; Frail or deconditioned Recommendations/Plan: Swallowing Evaluation Recommendations Swallowing Evaluation Recommendations Recommendations: NPO Medication Administration: Via alternative means Recommended consults: Consider Palliative care Treatment Plan Treatment Plan Treatment recommendations: Therapy as outlined in treatment plan below Follow-up recommendations: -- (TBD) Functional status assessment: Patient has had a recent decline in their functional status and demonstrates the ability to make significant improvements in function in a reasonable and predictable amount of time. Treatment frequency: Min 2x/week Treatment duration: 2 weeks Interventions: Trials of upgraded texture/liquids; Patient/family education Recommendations Recommendations for follow up therapy are one  component of a multi-disciplinary discharge planning process, led by the attending physician.  Recommendations may be updated based on patient status, additional functional criteria and insurance authorization. Assessment: Orofacial Exam: Orofacial Exam Oral Cavity: Oral Hygiene: Dried secretions (Thick, tenacious, yellow/green secretions on soft palate.  SLP unable to clear with swab and suction.) Oral Cavity - Dentition: Adequate natural dentition Orofacial Anatomy: -- (L facial swelling from parotitis) Oral Motor/Sensory Function: Other (comment) (Decreased lingual strenghth, slowness to lateralize, L facial asymmetry 2/2 parotitis.  Dysarthria noted.) Anatomy: Anatomy: Suspected cervical osteophytes (Confirmed via MRI 01/16/24) Boluses Administered: Boluses Administered Boluses Administered: Thin liquids (Level 0); Moderately thick liquids (Level 3, honey thick)  Oral Impairment Domain: Oral Impairment Domain Lip Closure: No labial escape Tongue control during bolus hold: Posterior escape of less than half of bolus Bolus preparation/mastication: -- (Not tested) Bolus transport/lingual motion: Repetitive/disorganized tongue motion Oral residue: Trace residue lining oral structures Location of oral residue : Tongue Initiation of pharyngeal swallow : No visible initiation at any location; Pyriform sinuses  Pharyngeal Impairment Domain: Pharyngeal Impairment Domain Soft palate elevation: No bolus between soft palate (SP)/pharyngeal wall (PW) Laryngeal elevation: Minimal superior movement of thyroid  cartilage with minimal approximation of arytenoids to epiglottic petiole Anterior hyoid excursion: No anterior movement Epiglottic movement: No inversion Laryngeal vestibule closure: None, wide column air/contrast in laryngeal vestibule Pharyngeal contraction (A/P view only): N/A Pharyngoesophageal segment opening: No distension with total obstruction of flow Tongue base retraction: Trace column of contrast or air between  tongue base and PPW Pharyngeal residue: Minimal to no pharyngeal clearance Location of pharyngeal residue: Valleculae; Pyriform sinuses  Esophageal Impairment Domain: Esophageal Impairment Domain Esophageal clearance upright position: -- (Unable to assess) Pill: Pill Consistency administered: -- (Not assessed) Penetration/Aspiration Scale Score: Penetration/Aspiration Scale Score 8.  Material enters airway, passes BELOW cords without attempt by patient to eject out (silent aspiration) : Moderately thick liquids (Level 3, honey thick); Thin liquids (Level 0) Compensatory Strategies: Compensatory Strategies Compensatory strategies: No   General Information: Caregiver present: No  Diet Prior to this Study: NPO   No data recorded  No data recorded  No data recorded  History of Recent Intubation: No  Behavior/Cognition: Alert; Cooperative; Pleasant mood No data recorded Baseline vocal quality/speech: -- (Dysarthria noted) Volitional Cough: Able to elicit Volitional Swallow: Unable to elicit (laryngeal bobbing without sufficient elevation to trigger pharyngeal swallow response) Exam Limitations: No limitations Goal Planning: Prognosis for improved oropharyngeal function: Guarded Barriers to Reach Goals: Severity of deficits No data recorded Patient/Family Stated Goal: Improve swallow function, gain weight Consulted and agree with results and recommendations: Patient; Physician; Nurse Pain: Pain Assessment Pain Assessment: Faces Faces Pain Scale: 0 End of Session: Start Time:SLP Start Time (ACUTE ONLY): 1153 Stop Time: SLP Stop  Time (ACUTE ONLY): 1208 Time Calculation:SLP Time Calculation (min) (ACUTE ONLY): 15 min Charges: SLP Evaluations $ SLP Speech Visit: 1 Visit SLP Evaluations $MBS Swallow: 1 Procedure SLP visit diagnosis: SLP Visit Diagnosis: Dysphagia, oropharyngeal phase (R13.12) Past Medical History: Past Medical History: Diagnosis Date  Aortic stenosis   Arthritis   CAD (coronary artery disease)   CHF  (congestive heart failure) (HCC)   Colitis   Edema   Essential hypertension   no med now  Essential tremor   Hyperlipidemia   Hypotonic bladder   L4-L5  Obstructive uropathy   Peripheral neuropathy   Elevated by Dr. love 2010  Prostate cancer [facility]) 2005 Past Surgical History: Past Surgical History: Procedure Laterality Date  ACHILLES TENDON SURGERY Bilateral 1995  AORTIC VALVE REPLACEMENT N/A 05/23/2020  Procedure: AORTIC VALVE REPLACEMENT (AVR) USING EDWARDS RESILIA 29 MM AORTIC VALVE.;  Surgeon: Lucas Dorise POUR, MD;  Location: [facility] OR;  Service: Open Heart Surgery;  Laterality: N/A;  CARDIAC CATHETERIZATION    CARDIOVERSION N/A 10/07/2022  Procedure: CARDIOVERSION;  Surgeon: Cherrie Toribio SAUNDERS, MD;  Location: [facility] INVASIVE CV LAB;  Service: Cardiovascular;  Laterality: N/A;  COLONOSCOPY    With polyp resection  CORONARY ARTERY BYPASS GRAFT N/A 05/23/2020  Procedure: CORONARY ARTERY BYPASS GRAFTING (CABG) USING LIMA to LAD; ENDOSCOPIC HARVESTED RIGHT GREATER SAPHENOUS VEIN: SVG to PD; SVG to SEQUENCED INTERMEDIATE to OM;  Surgeon: Lucas Dorise POUR, MD;  Location: [facility] OR;  Service: Open Heart Surgery;  Laterality: N/A;  ENDOVEIN HARVEST OF GREATER SAPHENOUS VEIN Right 05/23/2020  Procedure: ENDOVEIN HARVEST OF GREATER SAPHENOUS VEIN;  Surgeon: Lucas Dorise POUR, MD;  Location: [facility] OR;  Service: Open Heart Surgery;  Laterality: Right;  REVERSE SHOULDER ARTHROPLASTY Right 06/04/2016  REVERSE SHOULDER ARTHROPLASTY Right 06/04/2016  Procedure: REVERSE SHOULDER ARTHROPLASTY;  Surgeon: Marcey Her, MD;  Location: [facility] OR;  Service: Orthopedics;  Laterality: Right;  RIGHT/LEFT HEART CATH AND CORONARY ANGIOGRAPHY N/A 05/20/2020  Procedure: RIGHT/LEFT HEART CATH AND CORONARY ANGIOGRAPHY;  Surgeon: [place]az[place], Peter M, MD;  Location: [facility] INVASIVE CV LAB;  Service: Cardiovascular;  Laterality: N/A;  SKIN CANCER EXCISION  1974  On chest wall  TEE WITHOUT CARDIOVERSION N/A 05/23/2020  Procedure: TRANSESOPHAGEAL ECHOCARDIOGRAM (TEE);  Surgeon:  Lucas Dorise POUR, MD;  Location: [facility] OR;  Service: Open Heart Surgery;  Laterality: N/A;  TOTAL KNEE ARTHROPLASTY Right 2011  TOTAL KNEE ARTHROPLASTY Left 04/09/2013  Procedure: LEFT TOTAL KNEE ARTHROPLASTY;  Surgeon: Dempsey LULLA Moan, MD;  Location: [facility] ORS;  Service: Orthopedics;  Laterality: Left; Anette FORBES Grippe, MA, CCC-SLP Acute Rehabilitation Services Office: [phone] 01/28/2024, 1:29 PM  CT Chest W Contrast Result Date: 01/28/2024 EXAM: CT CHEST WITH CONTRAST 01/28/2024 01:01:20 AM TECHNIQUE: CT of the chest was performed with the administration of intravenous contrast. Multiplanar reformatted images are provided for review. Automated exposure control, iterative reconstruction, and/or weight based adjustment of the mA/kV was utilized to reduce the radiation dose to as low as reasonably achievable. COMPARISON: Same day chest radiograph and CT from 11/19/2022. CLINICAL HISTORY: Respiratory illness, nondiagnostic xray. FINDINGS: MEDIASTINUM: Cardiomegaly. No pericardial effusion. Coronary artery and aortic atherosclerotic calcifications. Aortic valve replacement. Sternotomy. Ascending aortic aneurysm measuring 42 mm. Fluid column in the esophagus compatible with reflux. LYMPH NODES: No mediastinal, hilar or axillary lymphadenopathy. LUNGS AND PLEURA: Layering debris in the trachea extending into and occluding the left mainstem bronchus. Debris fills the left lower lobe bronchi where there is atelectasis and poorly enhancing edema compatible with bronchopneumonia. Diffuse bronchial wall thickening. Scattered centrilobular nodules and tree-in-bud  opacities greatest in the right middle lobe. No pleural effusion or pneumothorax. SOFT TISSUES/BONES: No acute abnormality of the bones or soft tissues. UPPER ABDOMEN: Limited images of the upper abdomen demonstrates no acute abnormality. IMPRESSION: 1. Bronchopneumonia in the left lower lobe. 2. Debris in the trachea extending into and occluding the left mainstem  bronchus. 3. Chronic airway infection slash inflammation. 4. Ascending aortic aneurysm measuring 42 mm. Annual follow up CTA of the chest is recommended. Electronically signed by: Norman Gatlin MD 01/28/2024 01:20 AM EDT RP Workstation: [id number]   CT Maxillofacial W Contrast Result Date: 01/27/2024 CLINICAL DATA:  Provided history: Infection. Abscess. EXAM: CT MAXILLOFACIAL WITH CONTRAST TECHNIQUE: Multidetector CT imaging of the maxillofacial structures was performed with intravenous contrast. Multiplanar CT image reconstructions were also generated. RADIATION DOSE REDUCTION: This exam was performed according to the departmental dose-optimization program which includes automated exposure control, adjustment of the mA and/or kV according to patient size and/or use of iterative reconstruction technique. CONTRAST:  75mL OMNIPAQUE  IOHEXOL  350 MG/ML SOLN COMPARISON:  Brain MRI 01/16/2024. FINDINGS: Streak/beam hardening artifact arising from dental restoration partially obscures the oral cavity and perioral soft tissues. Within this limitation, findings are as follows. Osseous: No acute maxillofacial fracture. Periapical lucency surrounding the left mandibular second molar tooth consistent with periodontal disease. Cervical spondylosis and multilevel vertebral ankylosis at the visible levels. Orbits: No orbital mass or acute orbital finding. Sinuses: Minimal mucosal thickening within the left maxillary sinus. Soft tissues: Asymmetric prominence and ill-defined enhancement of the left parotid gland with surrounding edema. Limited intracranial: No evidence of an acute intracranial abnormality within the field of view. Cerebral atrophy with infarcts in the supratentorial infratentorial brain, better assessed on the recent prior brain MRI of 07/19/2023. IMPRESSION: 1. Streak/beam hardening artifact arising from dental restoration partially obscures the oral cavity and perioral soft tissues. Within this limitation,  findings are as follows. 2. Findings consistent with left parotitis. No evidence of a periparotid abscess. 3. Periapical lucency (consistent with periodontal disease) surrounding the left mandibular second molar tooth. No appreciable surrounding soft tissue inflammatory changes or soft tissue abscess. 4. Minor left maxillary sinus mucosal thickening. Electronically Signed   By: Rockey Childs D.O.   On: 01/27/2024 19:25   DG Chest Portable 1 View Result Date: 01/27/2024 CLINICAL DATA:  Cough/aspiration EXAM: PORTABLE CHEST 1 VIEW COMPARISON:  Chest radiograph January 16, 2024 FINDINGS: Cardiomediastinal silhouette is enlarged.  Aortic knob is prominent. Status post median sternotomy, aortic valve replacement and CABG. Increased interstitial markings of both lung fields and increased conspicuity of clusters of pulmonary nodules for example in right lower lobe. No significant pleural effusions. Status post right shoulder hemiarthroplasty. Flattening of left humeral head and severe arthropathy of left glenohumeral joint. Severe dextroconvex scoliosis and degenerative changes of the spine. IMPRESSION: Clusters of pulmonary nodules in right lung base and right middle lobe more conspicuous to prior. Correlate with chest CT findings in this patient with known malignancy. Enlarged cardiomediastinal silhouette, stable to prior. Electronically Signed   By: Megan  Zare M.D.   On: 01/27/2024 18:48   MR Brain W and Wo Contrast Result Date: 01/16/2024 EXAM: MRI BRAIN WITH AND WITHOUT CONTRAST MRI CERVICAL SPINE WITH AND WITHOUT CONTRAST 01/16/2024 06:40:29 PM TECHNIQUE: Multiplanar multisequence MRI of the brain was performed with and without the administration of intravenous contrast. Multiplanar multisequence MRI of the cervical spine was performed with and without the administration of intravenous contrast. COMPARISON: 11/18/2008 CLINICAL HISTORY: Stroke, follow up. FINDINGS: MRI BRAIN: BRAIN AND VENTRICLES:  Old cerebellar  infarct. Small focus of hyperintensity on diffusion-weighted imaging within the left occipital white matter without a clear ADC correlate or right PCA territory infarct. Early confluent hyperintense T2-weighted signal within the cerebral white matter, most commonly due to chronic small vessel disease. Mild volume loss. ORBITS: Ocular lens replacements. SINUSES AND MASTOIDS: No acute abnormality. BONES AND SOFT TISSUES: Normal bone marrow signal. No acute soft tissue abnormality. MRI CERVICAL SPINE: BONES AND ALIGNMENT: Lower cervical degenerative vertebral body height loss. No acute fracture. SPINAL CORD: Normal spinal cord size. Normal spinal cord signal. SOFT TISSUES: Unremarkable. C2-C3: Small disc osteophyte complex without stenosis. C3-C4: Small disc osteophyte complex without stenosis. C4-C5: Small disc osteophyte complex without stenosis. C5-C6: Intermediate-sized disc osteophyte complex with severe right foraminal stenosis. C6-C7: Intermediate-sized disc osteophyte complex with mild right and moderate left foraminal stenosis. C7-T1: No spinal canal or neural foraminal stenosis. IMPRESSION: 1. No acute intracranial abnormality or abnormal enhancement. 2. Abnormal diffusion without adc correlate in the left occipital white matter, possibly a subacute ischemic lesion 3. Mild volume loss and chronic small vessel disease. 4. Severe right foraminal stenosis at C5-6 and mild right/moderate left foraminal stenosis at C6-7. Electronically signed by: Franky Stanford MD 01/16/2024 08:06 PM EDT RP Workstation: [id number]   MR Cervical Spine W and Wo Contrast Result Date: 01/16/2024 EXAM: MRI BRAIN WITH AND WITHOUT CONTRAST MRI CERVICAL SPINE WITH AND WITHOUT CONTRAST 01/16/2024 06:40:29 PM TECHNIQUE: Multiplanar multisequence MRI of the brain was performed with and without the administration of intravenous contrast. Multiplanar multisequence MRI of the cervical spine was performed with and without the administration of  intravenous contrast. COMPARISON: 11/18/2008 CLINICAL HISTORY: Stroke, follow up. FINDINGS: MRI BRAIN: BRAIN AND VENTRICLES: Old cerebellar infarct. Small focus of hyperintensity on diffusion-weighted imaging within the left occipital white matter without a clear ADC correlate or right PCA territory infarct. Early confluent hyperintense T2-weighted signal within the cerebral white matter, most commonly due to chronic small vessel disease. Mild volume loss. ORBITS: Ocular lens replacements. SINUSES AND MASTOIDS: No acute abnormality. BONES AND SOFT TISSUES: Normal bone marrow signal. No acute soft tissue abnormality. MRI CERVICAL SPINE: BONES AND ALIGNMENT: Lower cervical degenerative vertebral body height loss. No acute fracture. SPINAL CORD: Normal spinal cord size. Normal spinal cord signal. SOFT TISSUES: Unremarkable. C2-C3: Small disc osteophyte complex without stenosis. C3-C4: Small disc osteophyte complex without stenosis. C4-C5: Small disc osteophyte complex without stenosis. C5-C6: Intermediate-sized disc osteophyte complex with severe right foraminal stenosis. C6-C7: Intermediate-sized disc osteophyte complex with mild right and moderate left foraminal stenosis. C7-T1: No spinal canal or neural foraminal stenosis. IMPRESSION: 1. No acute intracranial abnormality or abnormal enhancement. 2. Abnormal diffusion without adc correlate in the left occipital white matter, possibly a subacute ischemic lesion 3. Mild volume loss and chronic small vessel disease. 4. Severe right foraminal stenosis at C5-6 and mild right/moderate left foraminal stenosis at C6-7. Electronically signed by: Franky Stanford MD 01/16/2024 08:06 PM EDT RP Workstation: [id number]   CT Head Wo Contrast Result Date: 01/16/2024 CLINICAL DATA:  Blunt head trauma.  Altered mental status. EXAM: CT HEAD WITHOUT CONTRAST TECHNIQUE: Contiguous axial images were obtained from the base of the skull through the vertex without intravenous contrast.  RADIATION DOSE REDUCTION: This exam was performed according to the departmental dose-optimization program which includes automated exposure control, adjustment of the mA and/or kV according to patient size and/or use of iterative reconstruction technique. COMPARISON:  12/23/2023 FINDINGS: Brain: No evidence of intracranial hemorrhage, acute infarction, hydrocephalus, extra-axial collection, or mass  lesion/mass effect. Old right occipital lobe infarct and bilateral cerebellar infarcts are again seen. Moderate diffuse cerebral atrophy and chronic small vessel disease also unchanged in appearance. Vascular:  No hyperdense vessel or other acute findings. Skull: No evidence of fracture or other significant bone abnormality. Sinuses/Orbits:  No acute findings. Other: None. IMPRESSION: No acute intracranial abnormality. Stable cerebral atrophy, chronic small vessel disease, and old infarcts. Electronically Signed   By: Norleen DELENA Kil M.D.   On: 01/16/2024 14:02   DG Chest Portable 1 View Result Date: 01/16/2024 CLINICAL DATA:  Fall, difficulty swallowing, weight loss EXAM: PORTABLE CHEST 1 VIEW COMPARISON:  01/12/2024 FINDINGS: Stable reticulonodular interstitial accentuation the right lung base. Prior CABG and aortic valve replacement. Borderline enlargement of the cardiopericardial silhouette. Severe arthropathy of the left glenohumeral joint with eliminated left acromiohumeral distance compatible with chronic rotator cuff tear. Right reverse shoulder arthroplasty. Tortuous thoracic aorta. IMPRESSION: 1. Stable reticulonodular interstitial accentuation in the right lung base favoring chronic infection. 2. Borderline enlargement of the cardiopericardial silhouette. 3. Severe arthropathy of the left glenohumeral joint with chronic rotator cuff tear. Electronically Signed   By: Ryan Salvage M.D.   On: 01/16/2024 13:52    Microbiology: Results for orders placed or performed during the hospital encounter of 01/27/24   Blood culture (routine x 2)     Status: Abnormal   Collection Time: 01/27/24  5:16 PM   Specimen: BLOOD LEFT ARM  Result Value Ref Range Status   Specimen Description BLOOD LEFT ARM  Final   Special Requests   Final    BOTTLES DRAWN AEROBIC AND ANAEROBIC Blood Culture adequate volume   Culture  Setup Time   Final    GRAM POSITIVE COCCI IN CLUSTERS AEROBIC BOTTLE ONLY CRITICAL RESULT CALLED TO, READ BACK BY AND VERIFIED WITH: PHARMD MADELINE MITCHELL [id number] BY JINNY COMMON, [place] Performed at [facility], [address] [place], [place] [address]    Culture STAPHYLOCOCCUS AUREUS (A)  Final   Report Status 01/30/2024 FINAL  Final   Organism ID, Bacteria STAPHYLOCOCCUS AUREUS  Final      Susceptibility   Staphylococcus aureus - MIC*    CIPROFLOXACIN <=0.5 SENSITIVE Sensitive     ERYTHROMYCIN <=0.25 SENSITIVE Sensitive     GENTAMICIN  <=0.5 SENSITIVE Sensitive     OXACILLIN 0.5 SENSITIVE Sensitive     TETRACYCLINE >=16 RESISTANT Resistant     VANCOMYCIN  <=0.5 SENSITIVE Sensitive     TRIMETH/SULFA <=10 SENSITIVE Sensitive     CLINDAMYCIN <=0.25 SENSITIVE Sensitive     RIFAMPIN <=0.5 SENSITIVE Sensitive     Inducible Clindamycin NEGATIVE Sensitive     LINEZOLID 2 SENSITIVE Sensitive     * STAPHYLOCOCCUS AUREUS  Blood Culture ID Panel (Reflexed)     Status: Abnormal   Collection Time: 01/27/24  5:16 PM  Result Value Ref Range Status   Enterococcus faecalis NOT DETECTED NOT DETECTED Final   Enterococcus Faecium NOT DETECTED NOT DETECTED Final   Listeria monocytogenes NOT DETECTED NOT DETECTED Final   Staphylococcus species DETECTED (A) NOT DETECTED Final    Comment: CRITICAL RESULT CALLED TO, READ BACK BY AND VERIFIED WITH: PHARMD MADELINE MITCHELL [id number] 1407 BY J RAZZAK, MT    Staphylococcus aureus (BCID) DETECTED (A) NOT DETECTED Final    Comment: CRITICAL RESULT CALLED TO, READ BACK BY AND VERIFIED WITH: PHARMD MADELINE MITCHELL [id number] 1407 BY J RAZZAK, MT     Staphylococcus epidermidis NOT DETECTED NOT DETECTED Final   Staphylococcus lugdunensis NOT DETECTED NOT  DETECTED Final   Streptococcus species NOT DETECTED NOT DETECTED Final   Streptococcus agalactiae NOT DETECTED NOT DETECTED Final   Streptococcus pneumoniae NOT DETECTED NOT DETECTED Final   Streptococcus pyogenes NOT DETECTED NOT DETECTED Final   A.calcoaceticus-baumannii NOT DETECTED NOT DETECTED Final   Bacteroides fragilis NOT DETECTED NOT DETECTED Final   Enterobacterales NOT DETECTED NOT DETECTED Final   Enterobacter cloacae complex NOT DETECTED NOT DETECTED Final   Escherichia coli NOT DETECTED NOT DETECTED Final   Klebsiella aerogenes NOT DETECTED NOT DETECTED Final   Klebsiella oxytoca NOT DETECTED NOT DETECTED Final   Klebsiella pneumoniae NOT DETECTED NOT DETECTED Final   Proteus species NOT DETECTED NOT DETECTED Final   Salmonella species NOT DETECTED NOT DETECTED Final   Serratia marcescens NOT DETECTED NOT DETECTED Final   Haemophilus influenzae NOT DETECTED NOT DETECTED Final   Neisseria meningitidis NOT DETECTED NOT DETECTED Final   Pseudomonas aeruginosa NOT DETECTED NOT DETECTED Final   Stenotrophomonas maltophilia NOT DETECTED NOT DETECTED Final   Candida albicans NOT DETECTED NOT DETECTED Final   Candida auris NOT DETECTED NOT DETECTED Final   Candida glabrata NOT DETECTED NOT DETECTED Final   Candida krusei NOT DETECTED NOT DETECTED Final   Candida parapsilosis NOT DETECTED NOT DETECTED Final   Candida tropicalis NOT DETECTED NOT DETECTED Final   Cryptococcus neoformans/gattii NOT DETECTED NOT DETECTED Final   Meth resistant mecA/C and MREJ NOT DETECTED NOT DETECTED Final    Comment: Performed at [facility] [place] [facility], [address], [place], [place] [address]  Blood culture (routine x 2)     Status: Abnormal   Collection Time: 01/27/24  8:06 PM   Specimen: BLOOD  Result Value Ref Range Status   Specimen Description BLOOD SITE NOT SPECIFIED  Final   Special  Requests   Final    BOTTLES DRAWN AEROBIC ONLY Blood Culture adequate volume   Culture  Setup Time   Final    GRAM POSITIVE COCCI AEROBIC BOTTLE ONLY CRITICAL VALUE NOTED.  VALUE IS CONSISTENT WITH PREVIOUSLY REPORTED AND CALLED VALUE.    Culture (A)  Final    STAPHYLOCOCCUS AUREUS SUSCEPTIBILITIES PERFORMED ON PREVIOUS CULTURE WITHIN THE LAST 5 DAYS. Performed at [facility], [address], [place], [place] [address]    Report Status 01/30/2024 FINAL  Final  Culture, blood (routine x 2) Call MD if unable to obtain prior to antibiotics being given     Status: None (Preliminary result)   Collection Time: 01/27/24 10:12 PM   Specimen: BLOOD RIGHT ARM  Result Value Ref Range Status   Specimen Description BLOOD RIGHT ARM  Final   Special Requests   Final    BOTTLES DRAWN AEROBIC AND ANAEROBIC Blood Culture adequate volume   Culture   Final    NO GROWTH 3 DAYS Performed at [facility], [address] [place], [place] [address]    Report Status PENDING  Incomplete  Culture, blood (routine x 2) Call MD if unable to obtain prior to antibiotics being given     Status: None (Preliminary result)   Collection Time: 01/27/24 10:12 PM   Specimen: BLOOD RIGHT HAND  Result Value Ref Range Status   Specimen Description BLOOD RIGHT HAND  Final   Special Requests   Final    BOTTLES DRAWN AEROBIC ONLY Blood Culture results may not be optimal due to an inadequate volume of blood received in culture bottles   Culture   Final    NO GROWTH  3 DAYS Performed at [facility], [address], [place], [place] [address]    Report Status PENDING  Incomplete  Respiratory (~20 pathogens) panel by PCR     Status: None   Collection Time: 01/28/24  7:00 AM   Specimen: Nasopharyngeal Swab; Respiratory  Result Value Ref Range Status   Adenovirus NOT DETECTED NOT DETECTED Final   Coronavirus 229E NOT DETECTED NOT DETECTED Final    Comment: (NOTE) The Coronavirus on the Respiratory Panel,  DOES NOT test for the novel  Coronavirus (2019 nCoV)    Coronavirus HKU1 NOT DETECTED NOT DETECTED Final   Coronavirus NL63 NOT DETECTED NOT DETECTED Final   Coronavirus OC43 NOT DETECTED NOT DETECTED Final   Metapneumovirus NOT DETECTED NOT DETECTED Final   Rhinovirus / Enterovirus NOT DETECTED NOT DETECTED Final   Influenza A NOT DETECTED NOT DETECTED Final   Influenza B NOT DETECTED NOT DETECTED Final   Parainfluenza Virus 1 NOT DETECTED NOT DETECTED Final   Parainfluenza Virus 2 NOT DETECTED NOT DETECTED Final   Parainfluenza Virus 3 NOT DETECTED NOT DETECTED Final   Parainfluenza Virus 4 NOT DETECTED NOT DETECTED Final   Respiratory Syncytial Virus NOT DETECTED NOT DETECTED Final   Bordetella pertussis NOT DETECTED NOT DETECTED Final   Bordetella Parapertussis NOT DETECTED NOT DETECTED Final   Chlamydophila pneumoniae NOT DETECTED NOT DETECTED Final   Mycoplasma pneumoniae NOT DETECTED NOT DETECTED Final    Comment: Performed at [facility], [address] [place], [place] [address]  MRSA Next Gen by PCR, Nasal     Status: None   Collection Time: 01/28/24  7:00 AM   Specimen: Nasopharyngeal Swab; Nasal Swab  Result Value Ref Range Status   MRSA by PCR Next Gen NOT DETECTED NOT DETECTED Final    Comment: (NOTE) The GeneXpert MRSA Assay (FDA approved for NASAL specimens only), is one component of a comprehensive MRSA colonization surveillance program. It is not intended to diagnose MRSA infection nor to guide or monitor treatment for MRSA infections. Test performance is not FDA approved in patients less than 88 years old. Performed at [facility], [address], [place], [place] [address]   Culture, blood (Routine X 2) w Reflex to ID Panel     Status: None (Preliminary result)   Collection Time: 01/29/24  3:17 AM   Specimen: BLOOD RIGHT HAND  Result Value Ref Range Status   Specimen Description BLOOD RIGHT HAND  Final   Special Requests   Final    BOTTLES  DRAWN AEROBIC AND ANAEROBIC Blood Culture adequate volume   Culture   Final    NO GROWTH 1 DAY Performed at [facility], [address], [place], [place] [address]    Report Status PENDING  Incomplete  Culture, blood (Routine X 2) w Reflex to ID Panel     Status: None (Preliminary result)   Collection Time: 01/29/24  3:17 AM   Specimen: BLOOD LEFT HAND  Result Value Ref Range Status   Specimen Description BLOOD LEFT HAND  Final   Special Requests   Final    BOTTLES DRAWN AEROBIC AND ANAEROBIC Blood Culture results may not be optimal due to an inadequate volume of blood received in culture bottles   Culture   Final    NO GROWTH 1 DAY Performed at [facility], [address], [place], [place] [address]    Report Status PENDING  Incomplete    Labs: CBC: Recent  Labs  Lab 01/27/24 1720 01/28/24 1127 01/29/24 0317  WBC 14.7* 11.2* 15.1*  NEUTROABS 12.9*  --   --   HGB 14.4 10.4* 11.8*  HCT 43.7 33.9* 36.9*  MCV 94.4 103.7* 97.1  PLT 184 121* 157   Basic Metabolic Panel: Recent Labs  Lab 01/27/24 1720 01/29/24 0317  NA 142 148*  K 3.8 3.2*  CL 102 112*  CO2 26 24  GLUCOSE 180* 415*  BUN 43* 30*  CREATININE 1.59* 1.13  CALCIUM  9.1 8.3*  MG  --  2.1  PHOS  --  2.8   Liver Function Tests: Recent Labs  Lab 01/27/24 1720 01/29/24 0317  AST 28 20  ALT 15 12  ALKPHOS 74 59  BILITOT 2.2* 0.8  PROT 7.5 6.0*  ALBUMIN  3.2* 2.3*   CBG: Recent Labs  Lab 01/29/24 0021 01/29/24 0423 01/29/24 0630 01/29/24 1133 01/29/24 1756  GLUCAP 130* 140* 147* 147* 164*    Discharge time spent: less than 30 minutes.  Signed: Reyes VEAR Gaw, MD [facility] 01/30/2024

## 2024-01-30 NOTE — Progress Notes (Signed)
 SLP Cancellation Note  Patient Details Name: [name] [name] MRN: [id number] DOB: May 27, 1928   Cancelled treatment:       Reason Eval/Treat Not Completed: Other (comment) Pt is now comfort care with plan to d/c to [facility]. Per palliative care team, no further acute SLP needs. Will sign off.    Leita SAILOR., M.A. CCC-SLP Acute Rehabilitation Services Office: [phone]  Secure chat preferred  01/30/2024, 11:48 AM

## 2024-01-30 NOTE — TOC Initial Note (Addendum)
 Transition of Care [facility]) - Initial/Assessment Note    Patient Details  Name: [name] [name] MRN: [id number] Date of Birth: 1927/09/27  Transition of Care [facility]) CM/SW Contact:    [name] [name], LCSWA Phone Number: 01/30/2024, 8:31 AM  Clinical Narrative:   Per Palliative, patients wife/family would like [name]. Palliative messages [facility], CSW and RNCM to inform us . CSW reached out to pts wife, [name] [name], to confirm choice of hospice facility. [name] [name] agreed that she would like for [facility] to evaluate her husband at this time.   12:53 PM [facility] has approved and can take patient today, per [facility]. Awaiting [facility] to gain consents.   TOC will continue to follow.    Expected Discharge Plan: Hospice Medical Facility Barriers to Discharge: Other (must enter comment) (Awatiing [facility] eval and [facility] bed availability.)   Patient Goals and CMS Choice Patient states their goals for this hospitalization and ongoing recovery are:: Unable to assess          Expected Discharge Plan and Services In-house Referral: Clinical Social Work, Hospice / Palliative Care     Living arrangements for the past 2 months: Single Family Home                                      Prior Living Arrangements/Services Living arrangements for the past 2 months: Single Family Home Lives with:: Spouse Patient language and need for interpreter reviewed:: Yes Do you feel safe going back to the place where you live?:  (Unable to assess)      Need for Family Participation in Patient Care: Yes (Comment) Care giver support system in place?: Yes (comment)   Criminal Activity/Legal Involvement Pertinent to Current Situation/Hospitalization: No - Comment as needed  Activities of Daily Living      Permission Sought/Granted Permission sought to share information with : Family Supports, Oceanographer granted to share information with : No (Spouse is HCPOA; she gave  permission for [facility] to eval)  Share Information with NAME: [name] [name]  Permission granted to share info w AGENCY: [facility]  Permission granted to share info w Relationship: Spouse  Permission granted to share info w Contact Information: [phone]  Emotional Assessment Appearance:: Appears stated age Attitude/Demeanor/Rapport: Unable to Assess Affect (typically observed): Unable to Assess Orientation: : Oriented to Self, Oriented to Situation Alcohol  / Substance Use: Not Applicable Psych Involvement: No (comment)  Admission diagnosis:  Parotitis [K11.20] Hypoxia [R09.02] CAP (community acquired pneumonia) [J18.9] Immobility [Z74.09] Community acquired pneumonia, unspecified laterality [J18.9] Patient Active Problem List   Diagnosis Date Noted   Palliative care by specialist 01/29/2024   Aortic aneurysm (HCC) 01/28/2024   MSSA bacteremia 01/28/2024   Acute hypoxic respiratory failure (HCC) 01/27/2024   Parotitis, acute 01/27/2024   Dysphagia and weight loss for 2 months 01/27/2024   Failure to thrive in adult 01/27/2024   History of CAD (coronary artery disease) 01/27/2024   Paroxysmal atrial fibrillation (HCC) 01/27/2024   Prostate cancer (HCC) 01/27/2024   S/P AVR 10/09/2020   Malnutrition of moderate degree 05/29/2020   S/P CABG x 4 05/23/2020   Pressure injury of skin 05/20/2020   Chronic combined systolic and diastolic CHF (congestive heart failure) (HCC) 05/16/2020   Severe aortic stenosis 05/16/2020   Chronic kidney disease (CKD), stage III (moderate) (HCC) 05/16/2020   Pleural effusion on right 01/02/2020   S/P shoulder replacement, right 06/04/2016   Venous insufficiency  05/15/2015   BPH (benign prostatic hyperplasia) 08/15/2014   Sepsis due to pneumonia (HCC) 08/14/2014   Colitis 08/14/2014   Pneumonia 08/13/2014   OA (osteoarthritis) of knee 04/09/2013   Edema    Dyspnea 09/27/2012   Bilateral leg edema 09/27/2012   HTN (hypertension) 09/27/2012    Hyperlipidemia    PCP:  Aisha Harvey, MD Pharmacy:   [facility] [address] [place] - [address] [place] [address] Phone: [phone] Fax: [phone]  [facility] [place], [place] - [address] [place] [address] Phone: [phone] Fax: [phone]  [id number] [place], [place] - [address] AT [address] OF [facility] [address] [place] [address] Phone: [phone] Fax: [phone]     Social Drivers of Health (SDOH) Social History: SDOH Screenings   Food Insecurity: Patient Unable To Answer (01/28/2024)  Housing: Patient Unable To Answer (01/28/2024)  Transportation Needs: Patient Unable To Answer (01/28/2024)  Utilities: Patient Unable To Answer (01/28/2024)  Depression (PHQ2-9): Low Risk  (01/09/2021)  Social Connections: Unknown (01/28/2024)  Tobacco Use: Low Risk  (01/27/2024)   SDOH Interventions:     Readmission Risk Interventions     No data to display

## 2024-01-30 NOTE — Progress Notes (Signed)
 Hospital and room # [facility] hospital liaison note   Referral received from [facility] for family interest in [facility].    Met with patient and wife [name] in room to explain services and hospice philosophy and all questions answered.  [facility] is able to accept patient this afternoon once consents are complete.    RN staff, you may call report at any time to [phone]  room is assigned when report is called.  Please leave IV intact and send completed DNR with patient.   Updated attending and [facility] manager via [employer]. Thank you for the opportunity to participate in this patient's care  Greig Basket, BSN, RN Hospice nurse liaison [phone]

## 2024-01-30 NOTE — Progress Notes (Signed)
   Palliative Medicine Inpatient Follow Up Note HPI:  89 y.o. 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 to 35%, aortic stenosis s/p TAVR, CAD status post CABG, atrial fibrillation and flutter on Eliquis , CKD stage3a, chronic venous insufficiency of bilateral lower extremities, essential hypertension, chronic urinary retention-self cath as needed, hyperlipidemia, peripheral neuropathy, advanced prostate cancer, arthritis, and essential tremor. He is being treated for CAP and acute parotitis. Palliative care has been requested due to significant weight loss as a result of adult FTT in the setting of dysphagia.   Today's Discussion 01/30/2024  *Please note that this is a verbal dictation therefore any spelling or grammatical errors are due to the Dragon Medical One system interpretation.  Chart reviewed inclusive of vital signs, progress notes, laboratory results, and diagnostic images.   I met with [name] [name] and his spouse, [name] [name] this late morning. We discussed his present clinical condition and how they had spoken to Dr. Elpidio and spent time to speak with one another thereafter. The decision has been made to focus on comfort care. All artificial measures will be stopped and emphasis placed on symptom management.   Created space and opportunity for patient to explore thoughts feelings and fears regarding current medical situation. He uses analogies to baseball sharing understanding that he is at end of life. He vocalizes not wanting to feel as he does not.  [name] [name]'s Rabbi plans to visit this afternoon.   The plan will be for transition to [facility] once a bed is available.   Questions and concerns addressed/Palliative Support Provided.   Objective Assessment: Vital Signs Vitals:   01/29/24 1647 01/30/24 0822  BP: 115/75 124/80  Pulse: 73 74  Resp: 19 20  Temp: 97.7 F (36.5 C) 97.7 F (36.5 C)  SpO2: 96% 96%    Intake/Output Summary (Last 24 hours) at  01/30/2024 1108 Last data filed at 01/30/2024 0424 Gross per 24 hour  Intake 1538.33 ml  Output 225 ml  Net 1313.33 ml   Last Weight  Most recent update: 01/29/2024  3:53 AM    Weight  60.6 kg (133 lb 9.6 oz)            Gen: Elderly Caucasian male chronically ill in appearance HEENT: Dry mucous membranes CV: Regular rate and irregular rhythm PULM: On 5L nasal cannula breathing is even nonlabored ABD: soft/nontender  EXT: No edema Neuro: Alert and oriented x2-3  SUMMARY OF RECOMMENDATIONS   DNAR/DNI  GOLD DNR placed on chart  Comfort focuses care  Has low dose morphine  as needed  Glycopyrrolate  has been added ATC  Additional comfort medications per [facility]  Unrestricted visitation  Appreciate [facility] evaluation - TOC is coordinating   Ongoing palliative care support ______________________________________________________________________________________ [name] [name] [place] Palliative Medicine Team Team Cell Phone: [phone] Please utilize secure chat with additional questions, if there is no response within 30 minutes please call the above phone number  Time Spent: 62 Billing based on MDM: High  Palliative Medicine Team providers are available by phone from 7am to 7pm daily and can be reached through the team cell phone.  Should this patient require assistance outside of these hours, please call the patient's attending physician.

## 2024-01-30 NOTE — Progress Notes (Signed)
 AVS completed and printed for discharge packet.

## 2024-01-30 NOTE — TOC Transition Note (Signed)
 Transition of Care [facility]) - Discharge Note   Patient Details  Name: [name] [name] MRN: [id number] Date of Birth: [birth year]  Transition of Care [facility]) CM/SW Contact:  Luise JAYSON Pan, LCSWA Phone Number: 01/30/2024, 3:55 PM   Clinical Narrative:   Patient will DC to: [facility] Anticipated DC date: 01/30/24  Family notified: [name] (spouse) Transport by: [facility]   Per MD patient ready for DC to [facility]. RN to call report prior to discharge ([facility] to provide call to report number to RN). RN, patient, patient's family, and facility notified of DC. Discharge Summary and FL2 sent to facility. DC packet on chart. Ambulance transport requested for patient 3:55PM.   CSW will sign off for now as social work intervention is no longer needed. Please consult us  again if new needs arise.   Final next level of care: Hospice Medical Facility Barriers to Discharge: Barriers Resolved   Patient Goals and CMS Choice Patient states their goals for this hospitalization and ongoing recovery are:: Unable to assess          Discharge Placement              Patient chooses bed at: Other - please specify in the comment section below: [facility]) Patient to be transferred to facility by: [facility] Name of family member notified: [name] (Spouse) Patient and family notified of of transfer: 01/30/24  Discharge Plan and Services Additional resources added to the After Visit Summary for   In-house Referral: Clinical Social Work, Hospice / Palliative Care                                   Social Drivers of Health (SDOH) Interventions SDOH Screenings   Food Insecurity: Patient Unable To Answer (01/28/2024)  Housing: Patient Unable To Answer (01/28/2024)  Transportation Needs: Patient Unable To Answer (01/28/2024)  Utilities: Patient Unable To Answer (01/28/2024)  Depression (PHQ2-9): Low Risk  (01/09/2021)  Social Connections: Unknown (01/28/2024)  Tobacco Use: Low Risk  (01/27/2024)      Readmission Risk Interventions     No data to display

## 2024-01-31 LAB — LEGIONELLA PNEUMOPHILA SEROGP 1 UR AG: L. pneumophila Serogp 1 Ur Ag: NEGATIVE

## 2024-02-01 LAB — CULTURE, BLOOD (ROUTINE X 2)
Culture: NO GROWTH
Culture: NO GROWTH
Special Requests: ADEQUATE

## 2024-02-03 LAB — CULTURE, BLOOD (ROUTINE X 2)
Culture: NO GROWTH
Culture: NO GROWTH
Special Requests: ADEQUATE

## 2024-02-07 ENCOUNTER — Encounter (HOSPITAL_COMMUNITY)

## 2024-02-27 DEATH — deceased

## 2024-03-13 ENCOUNTER — Encounter (HOSPITAL_COMMUNITY): Admitting: Internal Medicine

## 2024-04-02 ENCOUNTER — Ambulatory Visit: Admitting: Cardiology
